# Patient Record
Sex: Female | Born: 1960 | Race: White | Hispanic: No | Marital: Single | Smoking: Never smoker
Health system: Southern US, Community
[De-identification: ages and names within clinical notes are randomized; demographics above are authoritative.]

## PROBLEM LIST (undated history)

## (undated) DIAGNOSIS — M7989 Other specified soft tissue disorders: Secondary | ICD-10-CM

## (undated) DIAGNOSIS — J189 Pneumonia, unspecified organism: Secondary | ICD-10-CM

## (undated) DIAGNOSIS — K589 Irritable bowel syndrome without diarrhea: Secondary | ICD-10-CM

## (undated) DIAGNOSIS — D696 Thrombocytopenia, unspecified: Secondary | ICD-10-CM

## (undated) DIAGNOSIS — G629 Polyneuropathy, unspecified: Secondary | ICD-10-CM

## (undated) DIAGNOSIS — F3289 Other specified depressive episodes: Secondary | ICD-10-CM

## (undated) DIAGNOSIS — R110 Nausea: Secondary | ICD-10-CM

## (undated) DIAGNOSIS — E785 Hyperlipidemia, unspecified: Secondary | ICD-10-CM

## (undated) DIAGNOSIS — I82409 Acute embolism and thrombosis of unspecified deep veins of unspecified lower extremity: Secondary | ICD-10-CM

## (undated) DIAGNOSIS — I509 Heart failure, unspecified: Secondary | ICD-10-CM

## (undated) DIAGNOSIS — N912 Amenorrhea, unspecified: Secondary | ICD-10-CM

## (undated) DIAGNOSIS — I499 Cardiac arrhythmia, unspecified: Secondary | ICD-10-CM

## (undated) DIAGNOSIS — K219 Gastro-esophageal reflux disease without esophagitis: Secondary | ICD-10-CM

## (undated) DIAGNOSIS — F419 Anxiety disorder, unspecified: Secondary | ICD-10-CM

## (undated) DIAGNOSIS — C541 Malignant neoplasm of endometrium: Secondary | ICD-10-CM

## (undated) DIAGNOSIS — F329 Major depressive disorder, single episode, unspecified: Secondary | ICD-10-CM

## (undated) DIAGNOSIS — A0472 Enterocolitis due to Clostridium difficile, not specified as recurrent: Secondary | ICD-10-CM

## (undated) DIAGNOSIS — R002 Palpitations: Secondary | ICD-10-CM

## (undated) DIAGNOSIS — R0602 Shortness of breath: Secondary | ICD-10-CM

## (undated) DIAGNOSIS — G4733 Obstructive sleep apnea (adult) (pediatric): Secondary | ICD-10-CM

## (undated) DIAGNOSIS — N189 Chronic kidney disease, unspecified: Secondary | ICD-10-CM

## (undated) DIAGNOSIS — R161 Splenomegaly, not elsewhere classified: Principal | ICD-10-CM

## (undated) DIAGNOSIS — Z9889 Other specified postprocedural states: Secondary | ICD-10-CM

## (undated) DIAGNOSIS — R112 Nausea with vomiting, unspecified: Secondary | ICD-10-CM

## (undated) DIAGNOSIS — R531 Weakness: Secondary | ICD-10-CM

## (undated) DIAGNOSIS — I209 Angina pectoris, unspecified: Secondary | ICD-10-CM

## (undated) DIAGNOSIS — R162 Hepatomegaly with splenomegaly, not elsewhere classified: Secondary | ICD-10-CM

## (undated) DIAGNOSIS — M1990 Unspecified osteoarthritis, unspecified site: Secondary | ICD-10-CM

## (undated) DIAGNOSIS — R32 Unspecified urinary incontinence: Secondary | ICD-10-CM

## (undated) HISTORY — DX: Hyperlipidemia, unspecified: E78.5

## (undated) HISTORY — DX: Palpitations: R00.2

## (undated) HISTORY — DX: Polyneuropathy, unspecified: G62.9

## (undated) HISTORY — PX: CHOLECYSTECTOMY: SHX55

## (undated) HISTORY — DX: Enterocolitis due to Clostridium difficile, not specified as recurrent: A04.72

## (undated) HISTORY — PX: SKIN GRAFT: SHX250

## (undated) HISTORY — DX: Thrombocytopenia, unspecified: D69.6

## (undated) HISTORY — DX: Hepatomegaly with splenomegaly, not elsewhere classified: R16.2

## (undated) HISTORY — DX: Major depressive disorder, single episode, unspecified: F32.9

## (undated) HISTORY — DX: Splenomegaly, not elsewhere classified: R16.1

## (undated) HISTORY — DX: Obstructive sleep apnea (adult) (pediatric): G47.33

## (undated) HISTORY — PX: FINGER SURGERY: SHX640

## (undated) HISTORY — DX: Nausea: R11.0

## (undated) HISTORY — DX: Weakness: R53.1

## (undated) HISTORY — DX: Amenorrhea, unspecified: N91.2

## (undated) HISTORY — PX: BREAST BIOPSY: SHX20

## (undated) HISTORY — PX: DILATION AND CURETTAGE OF UTERUS: SHX78

## (undated) HISTORY — DX: Gastro-esophageal reflux disease without esophagitis: K21.9

## (undated) HISTORY — DX: Unspecified urinary incontinence: R32

## (undated) HISTORY — DX: Other specified depressive episodes: F32.89

## (undated) HISTORY — DX: Morbid (severe) obesity due to excess calories: E66.01

## (undated) HISTORY — DX: Heart failure, unspecified: I50.9

## (undated) HISTORY — PX: ENDOMETRIAL ABLATION: SHX621

## (undated) HISTORY — DX: Unspecified osteoarthritis, unspecified site: M19.90

## (undated) HISTORY — DX: Other specified soft tissue disorders: M79.89

## (undated) HISTORY — DX: Acute embolism and thrombosis of unspecified deep veins of unspecified lower extremity: I82.409

---

## 1998-02-06 ENCOUNTER — Inpatient Hospital Stay (HOSPITAL_COMMUNITY): Admission: EM | Admit: 1998-02-06 | Discharge: 1998-02-17 | Payer: Self-pay | Admitting: Emergency Medicine

## 1998-09-01 ENCOUNTER — Encounter: Admission: RE | Admit: 1998-09-01 | Discharge: 1998-11-30 | Payer: Self-pay | Admitting: Family Medicine

## 1998-10-02 ENCOUNTER — Inpatient Hospital Stay (HOSPITAL_COMMUNITY): Admission: EM | Admit: 1998-10-02 | Discharge: 1998-10-07 | Payer: Self-pay | Admitting: Emergency Medicine

## 1998-10-02 ENCOUNTER — Encounter: Payer: Self-pay | Admitting: Emergency Medicine

## 1999-04-02 ENCOUNTER — Emergency Department (HOSPITAL_COMMUNITY): Admission: EM | Admit: 1999-04-02 | Discharge: 1999-04-02 | Payer: Self-pay | Admitting: Emergency Medicine

## 1999-04-02 ENCOUNTER — Encounter: Payer: Self-pay | Admitting: Emergency Medicine

## 1999-04-06 ENCOUNTER — Emergency Department (HOSPITAL_COMMUNITY): Admission: EM | Admit: 1999-04-06 | Discharge: 1999-04-06 | Payer: Self-pay | Admitting: Emergency Medicine

## 1999-04-06 ENCOUNTER — Encounter: Payer: Self-pay | Admitting: Emergency Medicine

## 1999-04-20 ENCOUNTER — Other Ambulatory Visit: Admission: RE | Admit: 1999-04-20 | Discharge: 1999-04-20 | Payer: Self-pay | Admitting: *Deleted

## 1999-05-17 ENCOUNTER — Encounter: Payer: Self-pay | Admitting: *Deleted

## 1999-05-17 ENCOUNTER — Inpatient Hospital Stay (HOSPITAL_COMMUNITY): Admission: EM | Admit: 1999-05-17 | Discharge: 1999-05-26 | Payer: Self-pay | Admitting: Podiatry

## 1999-06-03 ENCOUNTER — Encounter: Payer: Self-pay | Admitting: Emergency Medicine

## 1999-06-04 ENCOUNTER — Inpatient Hospital Stay (HOSPITAL_COMMUNITY): Admission: EM | Admit: 1999-06-04 | Discharge: 1999-06-07 | Payer: Self-pay | Admitting: Emergency Medicine

## 1999-06-05 ENCOUNTER — Encounter: Payer: Self-pay | Admitting: Family Medicine

## 1999-06-26 HISTORY — PX: TRACHEOSTOMY: SUR1362

## 1999-07-07 ENCOUNTER — Inpatient Hospital Stay (HOSPITAL_COMMUNITY): Admission: RE | Admit: 1999-07-07 | Discharge: 1999-07-18 | Payer: Self-pay | Admitting: Otolaryngology

## 1999-07-12 ENCOUNTER — Encounter: Payer: Self-pay | Admitting: Critical Care Medicine

## 1999-07-13 ENCOUNTER — Encounter: Payer: Self-pay | Admitting: Otolaryngology

## 1999-07-21 ENCOUNTER — Inpatient Hospital Stay (HOSPITAL_COMMUNITY): Admission: EM | Admit: 1999-07-21 | Discharge: 1999-08-02 | Payer: Self-pay | Admitting: Emergency Medicine

## 1999-08-21 ENCOUNTER — Emergency Department (HOSPITAL_COMMUNITY): Admission: EM | Admit: 1999-08-21 | Discharge: 1999-08-21 | Payer: Self-pay | Admitting: Emergency Medicine

## 1999-09-02 ENCOUNTER — Emergency Department (HOSPITAL_COMMUNITY): Admission: EM | Admit: 1999-09-02 | Discharge: 1999-09-02 | Payer: Self-pay | Admitting: Emergency Medicine

## 1999-10-09 ENCOUNTER — Encounter: Payer: Self-pay | Admitting: Emergency Medicine

## 1999-10-09 ENCOUNTER — Inpatient Hospital Stay (HOSPITAL_COMMUNITY): Admission: EM | Admit: 1999-10-09 | Discharge: 1999-10-12 | Payer: Self-pay | Admitting: Emergency Medicine

## 1999-10-18 ENCOUNTER — Encounter: Payer: Self-pay | Admitting: Emergency Medicine

## 1999-10-18 ENCOUNTER — Emergency Department (HOSPITAL_COMMUNITY): Admission: EM | Admit: 1999-10-18 | Discharge: 1999-10-18 | Payer: Self-pay | Admitting: Emergency Medicine

## 1999-12-16 ENCOUNTER — Emergency Department (HOSPITAL_COMMUNITY): Admission: EM | Admit: 1999-12-16 | Discharge: 1999-12-16 | Payer: Self-pay | Admitting: Emergency Medicine

## 1999-12-16 ENCOUNTER — Encounter: Payer: Self-pay | Admitting: Emergency Medicine

## 2000-02-19 ENCOUNTER — Emergency Department (HOSPITAL_COMMUNITY): Admission: EM | Admit: 2000-02-19 | Discharge: 2000-02-19 | Payer: Self-pay | Admitting: Emergency Medicine

## 2000-04-13 ENCOUNTER — Emergency Department (HOSPITAL_COMMUNITY): Admission: EM | Admit: 2000-04-13 | Discharge: 2000-04-13 | Payer: Self-pay | Admitting: *Deleted

## 2000-04-13 ENCOUNTER — Encounter: Payer: Self-pay | Admitting: *Deleted

## 2000-06-21 ENCOUNTER — Ambulatory Visit (HOSPITAL_COMMUNITY): Admission: RE | Admit: 2000-06-21 | Discharge: 2000-06-21 | Payer: Self-pay | Admitting: Family Medicine

## 2000-07-07 ENCOUNTER — Emergency Department (HOSPITAL_COMMUNITY): Admission: EM | Admit: 2000-07-07 | Discharge: 2000-07-07 | Payer: Self-pay | Admitting: Emergency Medicine

## 2000-07-07 ENCOUNTER — Encounter: Payer: Self-pay | Admitting: Emergency Medicine

## 2000-08-06 ENCOUNTER — Encounter: Admission: RE | Admit: 2000-08-06 | Discharge: 2000-08-06 | Payer: Self-pay | Admitting: Family Medicine

## 2000-08-06 ENCOUNTER — Encounter: Payer: Self-pay | Admitting: Family Medicine

## 2000-10-13 ENCOUNTER — Emergency Department (HOSPITAL_COMMUNITY): Admission: EM | Admit: 2000-10-13 | Discharge: 2000-10-13 | Payer: Self-pay | Admitting: Emergency Medicine

## 2000-10-13 ENCOUNTER — Encounter: Payer: Self-pay | Admitting: Emergency Medicine

## 2000-10-15 ENCOUNTER — Emergency Department (HOSPITAL_COMMUNITY): Admission: EM | Admit: 2000-10-15 | Discharge: 2000-10-15 | Payer: Self-pay | Admitting: Emergency Medicine

## 2000-10-17 ENCOUNTER — Emergency Department (HOSPITAL_COMMUNITY): Admission: EM | Admit: 2000-10-17 | Discharge: 2000-10-17 | Payer: Self-pay | Admitting: Emergency Medicine

## 2000-11-27 ENCOUNTER — Emergency Department (HOSPITAL_COMMUNITY): Admission: EM | Admit: 2000-11-27 | Discharge: 2000-11-27 | Payer: Self-pay | Admitting: Emergency Medicine

## 2000-11-27 ENCOUNTER — Encounter: Payer: Self-pay | Admitting: Emergency Medicine

## 2000-11-28 ENCOUNTER — Inpatient Hospital Stay (HOSPITAL_COMMUNITY): Admission: EM | Admit: 2000-11-28 | Discharge: 2000-12-02 | Payer: Self-pay | Admitting: Emergency Medicine

## 2000-11-28 ENCOUNTER — Encounter: Payer: Self-pay | Admitting: Emergency Medicine

## 2001-02-14 ENCOUNTER — Other Ambulatory Visit: Admission: RE | Admit: 2001-02-14 | Discharge: 2001-02-14 | Payer: Self-pay | Admitting: Obstetrics and Gynecology

## 2001-03-05 ENCOUNTER — Encounter (INDEPENDENT_AMBULATORY_CARE_PROVIDER_SITE_OTHER): Payer: Self-pay | Admitting: Specialist

## 2001-03-05 ENCOUNTER — Observation Stay (HOSPITAL_COMMUNITY): Admission: RE | Admit: 2001-03-05 | Discharge: 2001-03-06 | Payer: Self-pay | Admitting: Obstetrics and Gynecology

## 2001-06-20 ENCOUNTER — Encounter: Payer: Self-pay | Admitting: Emergency Medicine

## 2001-06-20 ENCOUNTER — Inpatient Hospital Stay (HOSPITAL_COMMUNITY): Admission: EM | Admit: 2001-06-20 | Discharge: 2001-06-23 | Payer: Self-pay | Admitting: Emergency Medicine

## 2001-06-25 HISTORY — PX: ESOPHAGEAL DILATION: SHX303

## 2001-09-12 ENCOUNTER — Emergency Department (HOSPITAL_COMMUNITY): Admission: EM | Admit: 2001-09-12 | Discharge: 2001-09-12 | Payer: Self-pay

## 2001-09-18 ENCOUNTER — Encounter: Payer: Self-pay | Admitting: Emergency Medicine

## 2001-09-19 ENCOUNTER — Inpatient Hospital Stay (HOSPITAL_COMMUNITY): Admission: EM | Admit: 2001-09-19 | Discharge: 2001-09-22 | Payer: Self-pay | Admitting: Emergency Medicine

## 2002-02-18 ENCOUNTER — Ambulatory Visit (HOSPITAL_COMMUNITY): Admission: RE | Admit: 2002-02-18 | Discharge: 2002-02-18 | Payer: Self-pay | Admitting: Internal Medicine

## 2002-02-18 ENCOUNTER — Encounter: Payer: Self-pay | Admitting: Internal Medicine

## 2002-02-25 ENCOUNTER — Encounter: Payer: Self-pay | Admitting: Internal Medicine

## 2002-02-25 ENCOUNTER — Ambulatory Visit (HOSPITAL_COMMUNITY): Admission: RE | Admit: 2002-02-25 | Discharge: 2002-02-25 | Payer: Self-pay | Admitting: Internal Medicine

## 2002-03-06 ENCOUNTER — Emergency Department (HOSPITAL_COMMUNITY): Admission: EM | Admit: 2002-03-06 | Discharge: 2002-03-06 | Payer: Self-pay

## 2002-04-06 ENCOUNTER — Other Ambulatory Visit: Admission: RE | Admit: 2002-04-06 | Discharge: 2002-04-06 | Payer: Self-pay | Admitting: Obstetrics and Gynecology

## 2002-07-06 ENCOUNTER — Emergency Department (HOSPITAL_COMMUNITY): Admission: EM | Admit: 2002-07-06 | Discharge: 2002-07-06 | Payer: Self-pay | Admitting: Emergency Medicine

## 2002-12-19 ENCOUNTER — Emergency Department (HOSPITAL_COMMUNITY): Admission: EM | Admit: 2002-12-19 | Discharge: 2002-12-20 | Payer: Self-pay | Admitting: *Deleted

## 2003-01-26 ENCOUNTER — Encounter: Admission: RE | Admit: 2003-01-26 | Discharge: 2003-04-26 | Payer: Self-pay | Admitting: Family Medicine

## 2003-04-13 ENCOUNTER — Other Ambulatory Visit: Admission: RE | Admit: 2003-04-13 | Discharge: 2003-04-13 | Payer: Self-pay | Admitting: Obstetrics and Gynecology

## 2003-05-24 ENCOUNTER — Emergency Department (HOSPITAL_COMMUNITY): Admission: EM | Admit: 2003-05-24 | Discharge: 2003-05-24 | Payer: Self-pay | Admitting: Emergency Medicine

## 2003-06-24 ENCOUNTER — Emergency Department (HOSPITAL_COMMUNITY): Admission: EM | Admit: 2003-06-24 | Discharge: 2003-06-24 | Payer: Self-pay | Admitting: Emergency Medicine

## 2003-07-20 ENCOUNTER — Encounter: Admission: RE | Admit: 2003-07-20 | Discharge: 2003-10-18 | Payer: Self-pay | Admitting: Family Medicine

## 2003-09-30 ENCOUNTER — Ambulatory Visit (HOSPITAL_COMMUNITY): Admission: RE | Admit: 2003-09-30 | Discharge: 2003-09-30 | Payer: Self-pay | Admitting: Family Medicine

## 2003-10-19 ENCOUNTER — Encounter: Admission: RE | Admit: 2003-10-19 | Discharge: 2004-01-17 | Payer: Self-pay | Admitting: Family Medicine

## 2003-11-05 ENCOUNTER — Encounter (INDEPENDENT_AMBULATORY_CARE_PROVIDER_SITE_OTHER): Payer: Self-pay | Admitting: *Deleted

## 2003-11-05 ENCOUNTER — Ambulatory Visit (HOSPITAL_COMMUNITY): Admission: RE | Admit: 2003-11-05 | Discharge: 2003-11-06 | Payer: Self-pay | Admitting: General Surgery

## 2004-03-21 ENCOUNTER — Encounter: Admission: RE | Admit: 2004-03-21 | Discharge: 2004-03-21 | Payer: Self-pay | Admitting: Family Medicine

## 2004-05-04 ENCOUNTER — Other Ambulatory Visit: Admission: RE | Admit: 2004-05-04 | Discharge: 2004-05-04 | Payer: Self-pay | Admitting: Obstetrics and Gynecology

## 2004-06-14 ENCOUNTER — Encounter: Admission: RE | Admit: 2004-06-14 | Discharge: 2004-06-14 | Payer: Self-pay | Admitting: Family Medicine

## 2004-06-14 ENCOUNTER — Ambulatory Visit: Payer: Self-pay | Admitting: Pulmonary Disease

## 2004-06-24 ENCOUNTER — Emergency Department (HOSPITAL_COMMUNITY): Admission: EM | Admit: 2004-06-24 | Discharge: 2004-06-24 | Payer: Self-pay | Admitting: Emergency Medicine

## 2004-06-27 ENCOUNTER — Ambulatory Visit: Payer: Self-pay | Admitting: Pulmonary Disease

## 2004-07-04 ENCOUNTER — Ambulatory Visit: Payer: Self-pay | Admitting: Pulmonary Disease

## 2004-09-20 ENCOUNTER — Encounter: Admission: RE | Admit: 2004-09-20 | Discharge: 2004-12-19 | Payer: Self-pay | Admitting: Family Medicine

## 2004-10-05 ENCOUNTER — Ambulatory Visit: Payer: Self-pay | Admitting: Infectious Diseases

## 2004-11-02 ENCOUNTER — Ambulatory Visit: Payer: Self-pay | Admitting: Infectious Diseases

## 2004-11-16 ENCOUNTER — Ambulatory Visit: Payer: Self-pay | Admitting: Internal Medicine

## 2004-12-01 ENCOUNTER — Ambulatory Visit: Payer: Self-pay | Admitting: Infectious Diseases

## 2004-12-21 ENCOUNTER — Ambulatory Visit: Payer: Self-pay | Admitting: Pulmonary Disease

## 2004-12-25 ENCOUNTER — Ambulatory Visit: Payer: Self-pay | Admitting: Internal Medicine

## 2005-02-27 ENCOUNTER — Encounter: Admission: RE | Admit: 2005-02-27 | Discharge: 2005-03-19 | Payer: Self-pay | Admitting: Family Medicine

## 2005-03-16 ENCOUNTER — Ambulatory Visit: Payer: Self-pay | Admitting: Infectious Diseases

## 2005-04-01 ENCOUNTER — Inpatient Hospital Stay (HOSPITAL_COMMUNITY): Admission: RE | Admit: 2005-04-01 | Discharge: 2005-04-08 | Payer: Self-pay | Admitting: Obstetrics and Gynecology

## 2005-04-03 ENCOUNTER — Encounter (INDEPENDENT_AMBULATORY_CARE_PROVIDER_SITE_OTHER): Payer: Self-pay | Admitting: *Deleted

## 2005-04-09 ENCOUNTER — Inpatient Hospital Stay (HOSPITAL_COMMUNITY): Admission: AD | Admit: 2005-04-09 | Discharge: 2005-04-09 | Payer: Self-pay | Admitting: Obstetrics and Gynecology

## 2005-05-11 ENCOUNTER — Ambulatory Visit: Payer: Self-pay | Admitting: Pulmonary Disease

## 2005-05-22 ENCOUNTER — Ambulatory Visit: Payer: Self-pay | Admitting: Infectious Diseases

## 2005-05-23 ENCOUNTER — Other Ambulatory Visit: Admission: RE | Admit: 2005-05-23 | Discharge: 2005-05-23 | Payer: Self-pay | Admitting: Obstetrics and Gynecology

## 2005-05-29 ENCOUNTER — Encounter: Admission: RE | Admit: 2005-05-29 | Discharge: 2005-06-24 | Payer: Self-pay | Admitting: Family Medicine

## 2005-08-21 ENCOUNTER — Encounter: Admission: RE | Admit: 2005-08-21 | Discharge: 2005-08-21 | Payer: Self-pay | Admitting: Neurology

## 2005-08-28 ENCOUNTER — Encounter: Admission: RE | Admit: 2005-08-28 | Discharge: 2005-11-26 | Payer: Self-pay | Admitting: Family Medicine

## 2005-08-30 ENCOUNTER — Encounter: Admission: RE | Admit: 2005-08-30 | Discharge: 2005-11-28 | Payer: Self-pay | Admitting: Neurology

## 2005-11-08 ENCOUNTER — Ambulatory Visit: Payer: Self-pay | Admitting: Pulmonary Disease

## 2005-12-05 ENCOUNTER — Encounter: Admission: RE | Admit: 2005-12-05 | Discharge: 2005-12-05 | Payer: Self-pay | Admitting: Family Medicine

## 2006-01-01 ENCOUNTER — Encounter: Admission: RE | Admit: 2006-01-01 | Discharge: 2006-03-14 | Payer: Self-pay | Admitting: Family Medicine

## 2006-01-29 ENCOUNTER — Encounter (INDEPENDENT_AMBULATORY_CARE_PROVIDER_SITE_OTHER): Payer: Self-pay | Admitting: Cardiology

## 2006-01-29 ENCOUNTER — Ambulatory Visit (HOSPITAL_COMMUNITY): Admission: RE | Admit: 2006-01-29 | Discharge: 2006-01-29 | Payer: Self-pay | Admitting: Family Medicine

## 2006-02-06 ENCOUNTER — Ambulatory Visit: Payer: Self-pay | Admitting: Pulmonary Disease

## 2006-03-12 ENCOUNTER — Emergency Department (HOSPITAL_COMMUNITY): Admission: EM | Admit: 2006-03-12 | Discharge: 2006-03-12 | Payer: Self-pay | Admitting: Emergency Medicine

## 2006-03-25 ENCOUNTER — Ambulatory Visit: Admission: RE | Admit: 2006-03-25 | Discharge: 2006-03-25 | Payer: Self-pay | Admitting: Family Medicine

## 2006-04-03 ENCOUNTER — Ambulatory Visit: Payer: Self-pay | Admitting: Pulmonary Disease

## 2006-04-17 ENCOUNTER — Encounter: Admission: RE | Admit: 2006-04-17 | Discharge: 2006-07-16 | Payer: Self-pay | Admitting: Family Medicine

## 2006-04-30 ENCOUNTER — Ambulatory Visit: Payer: Self-pay | Admitting: Oncology

## 2006-05-07 LAB — MORPHOLOGY

## 2006-05-07 LAB — CHCC SMEAR

## 2006-05-07 LAB — CBC WITH DIFFERENTIAL/PLATELET
Basophils Absolute: 0 10*3/uL (ref 0.0–0.1)
EOS%: 0.9 % (ref 0.0–7.0)
HGB: 12.5 g/dL (ref 11.6–15.9)
LYMPH%: 16.9 % (ref 14.0–48.0)
MCH: 29.2 pg (ref 26.0–34.0)
MCV: 88.5 fL (ref 81.0–101.0)
MONO%: 10.1 % (ref 0.0–13.0)
Platelets: 175 10*3/uL (ref 145–400)
RDW: 15.6 % — ABNORMAL HIGH (ref 11.3–14.5)

## 2006-05-08 LAB — URIC ACID: Uric Acid, Serum: 8.5 mg/dL — ABNORMAL HIGH (ref 2.4–7.0)

## 2006-05-08 LAB — COMPREHENSIVE METABOLIC PANEL
ALT: 17 U/L (ref 0–35)
Albumin: 3.9 g/dL (ref 3.5–5.2)
CO2: 28 mEq/L (ref 19–32)
Calcium: 9.3 mg/dL (ref 8.4–10.5)
Chloride: 111 mEq/L (ref 96–112)
Glucose, Bld: 153 mg/dL — ABNORMAL HIGH (ref 70–99)
Potassium: 4.8 mEq/L (ref 3.5–5.3)
Sodium: 137 mEq/L (ref 135–145)
Total Protein: 7.4 g/dL (ref 6.0–8.3)

## 2006-05-08 LAB — BETA 2 MICROGLOBULIN, SERUM: Beta-2 Microglobulin: 2.77 mg/L — ABNORMAL HIGH (ref 1.01–1.73)

## 2006-05-08 LAB — LACTATE DEHYDROGENASE: LDH: 161 U/L (ref 94–250)

## 2006-05-08 LAB — PROTEIN ELECTROPHORESIS, SERUM
Beta 2: 7.3 % — ABNORMAL HIGH (ref 3.2–6.5)
Gamma Globulin: 20.1 % — ABNORMAL HIGH (ref 11.1–18.8)

## 2006-05-15 ENCOUNTER — Emergency Department (HOSPITAL_COMMUNITY): Admission: EM | Admit: 2006-05-15 | Discharge: 2006-05-15 | Payer: Self-pay | Admitting: Emergency Medicine

## 2006-05-29 ENCOUNTER — Ambulatory Visit (HOSPITAL_COMMUNITY): Admission: RE | Admit: 2006-05-29 | Discharge: 2006-05-29 | Payer: Self-pay | Admitting: Oncology

## 2006-06-13 ENCOUNTER — Ambulatory Visit: Payer: Self-pay | Admitting: Oncology

## 2006-06-20 LAB — CBC WITH DIFFERENTIAL/PLATELET
BASO%: 0.3 % (ref 0.0–2.0)
EOS%: 1.2 % (ref 0.0–7.0)
MCH: 29.7 pg (ref 26.0–34.0)
MCHC: 32.7 g/dL (ref 32.0–36.0)
MONO%: 9.7 % (ref 0.0–13.0)
NEUT%: 71.3 % (ref 39.6–76.8)
RDW: 16.6 % — ABNORMAL HIGH (ref 11.3–14.5)
lymph#: 1.4 10*3/uL (ref 0.9–3.3)

## 2006-06-20 LAB — MORPHOLOGY: PLT EST: DECREASED

## 2006-07-10 ENCOUNTER — Encounter: Admission: RE | Admit: 2006-07-10 | Discharge: 2006-07-10 | Payer: Self-pay | Admitting: Family Medicine

## 2006-07-23 ENCOUNTER — Encounter: Admission: RE | Admit: 2006-07-23 | Discharge: 2006-10-21 | Payer: Self-pay | Admitting: Family Medicine

## 2006-08-22 ENCOUNTER — Encounter: Admission: RE | Admit: 2006-08-22 | Discharge: 2006-11-20 | Payer: Self-pay | Admitting: Family Medicine

## 2006-09-05 ENCOUNTER — Ambulatory Visit: Payer: Self-pay | Admitting: Pulmonary Disease

## 2006-11-13 ENCOUNTER — Inpatient Hospital Stay (HOSPITAL_COMMUNITY): Admission: EM | Admit: 2006-11-13 | Discharge: 2006-11-16 | Payer: Self-pay | Admitting: Emergency Medicine

## 2006-11-17 ENCOUNTER — Ambulatory Visit: Payer: Self-pay | Admitting: Pulmonary Disease

## 2006-11-17 ENCOUNTER — Inpatient Hospital Stay (HOSPITAL_COMMUNITY): Admission: EM | Admit: 2006-11-17 | Discharge: 2006-11-22 | Payer: Self-pay | Admitting: Emergency Medicine

## 2006-12-11 ENCOUNTER — Ambulatory Visit: Payer: Self-pay | Admitting: Pulmonary Disease

## 2006-12-19 ENCOUNTER — Encounter: Admission: RE | Admit: 2006-12-19 | Discharge: 2007-03-19 | Payer: Self-pay | Admitting: Family Medicine

## 2007-02-06 ENCOUNTER — Inpatient Hospital Stay (HOSPITAL_COMMUNITY): Admission: EM | Admit: 2007-02-06 | Discharge: 2007-02-10 | Payer: Self-pay | Admitting: Emergency Medicine

## 2007-02-27 ENCOUNTER — Ambulatory Visit: Payer: Self-pay | Admitting: Pulmonary Disease

## 2007-03-28 ENCOUNTER — Encounter (INDEPENDENT_AMBULATORY_CARE_PROVIDER_SITE_OTHER): Payer: Self-pay | Admitting: *Deleted

## 2007-03-28 ENCOUNTER — Encounter: Admission: RE | Admit: 2007-03-28 | Discharge: 2007-03-28 | Payer: Self-pay | Admitting: Urology

## 2007-06-10 ENCOUNTER — Encounter (HOSPITAL_BASED_OUTPATIENT_CLINIC_OR_DEPARTMENT_OTHER): Admission: RE | Admit: 2007-06-10 | Discharge: 2007-06-23 | Payer: Self-pay | Admitting: Surgery

## 2007-06-24 ENCOUNTER — Encounter (HOSPITAL_BASED_OUTPATIENT_CLINIC_OR_DEPARTMENT_OTHER): Admission: RE | Admit: 2007-06-24 | Discharge: 2007-07-29 | Payer: Self-pay | Admitting: Surgery

## 2007-07-01 ENCOUNTER — Encounter: Admission: RE | Admit: 2007-07-01 | Discharge: 2007-07-01 | Payer: Self-pay | Admitting: Family Medicine

## 2007-08-07 DIAGNOSIS — G4733 Obstructive sleep apnea (adult) (pediatric): Secondary | ICD-10-CM

## 2007-08-07 DIAGNOSIS — E678 Other specified hyperalimentation: Secondary | ICD-10-CM | POA: Insufficient documentation

## 2007-08-08 ENCOUNTER — Ambulatory Visit: Payer: Self-pay | Admitting: Pulmonary Disease

## 2007-09-02 ENCOUNTER — Encounter: Admission: RE | Admit: 2007-09-02 | Discharge: 2007-09-02 | Payer: Self-pay | Admitting: Family Medicine

## 2007-09-04 ENCOUNTER — Encounter: Payer: Self-pay | Admitting: Pulmonary Disease

## 2007-09-16 ENCOUNTER — Encounter: Payer: Self-pay | Admitting: Pulmonary Disease

## 2007-10-14 ENCOUNTER — Encounter: Payer: Self-pay | Admitting: Pulmonary Disease

## 2007-11-04 ENCOUNTER — Encounter: Admission: RE | Admit: 2007-11-04 | Discharge: 2007-11-04 | Payer: Self-pay | Admitting: Family Medicine

## 2007-11-13 ENCOUNTER — Ambulatory Visit: Admission: RE | Admit: 2007-11-13 | Discharge: 2007-11-13 | Payer: Self-pay | Admitting: Family Medicine

## 2007-11-13 ENCOUNTER — Encounter (INDEPENDENT_AMBULATORY_CARE_PROVIDER_SITE_OTHER): Payer: Self-pay | Admitting: Family Medicine

## 2007-11-18 ENCOUNTER — Encounter: Admission: RE | Admit: 2007-11-18 | Discharge: 2007-12-02 | Payer: Self-pay | Admitting: Family Medicine

## 2008-01-09 ENCOUNTER — Telehealth (INDEPENDENT_AMBULATORY_CARE_PROVIDER_SITE_OTHER): Payer: Self-pay | Admitting: *Deleted

## 2008-01-12 ENCOUNTER — Encounter: Payer: Self-pay | Admitting: Pulmonary Disease

## 2008-01-23 ENCOUNTER — Ambulatory Visit: Payer: Self-pay | Admitting: Pulmonary Disease

## 2008-02-17 ENCOUNTER — Encounter: Payer: Self-pay | Admitting: Pulmonary Disease

## 2008-02-17 ENCOUNTER — Ambulatory Visit (HOSPITAL_BASED_OUTPATIENT_CLINIC_OR_DEPARTMENT_OTHER): Admission: RE | Admit: 2008-02-17 | Discharge: 2008-02-17 | Payer: Self-pay | Admitting: Pulmonary Disease

## 2008-02-19 ENCOUNTER — Ambulatory Visit: Payer: Self-pay | Admitting: Pulmonary Disease

## 2008-02-25 ENCOUNTER — Ambulatory Visit: Payer: Self-pay | Admitting: Pulmonary Disease

## 2008-03-02 ENCOUNTER — Encounter: Admission: RE | Admit: 2008-03-02 | Discharge: 2008-03-02 | Payer: Self-pay | Admitting: Family Medicine

## 2008-03-22 ENCOUNTER — Telehealth: Payer: Self-pay | Admitting: Internal Medicine

## 2008-03-26 DIAGNOSIS — R109 Unspecified abdominal pain: Secondary | ICD-10-CM

## 2008-03-26 DIAGNOSIS — K7689 Other specified diseases of liver: Secondary | ICD-10-CM | POA: Insufficient documentation

## 2008-03-26 DIAGNOSIS — K219 Gastro-esophageal reflux disease without esophagitis: Secondary | ICD-10-CM

## 2008-03-26 DIAGNOSIS — A4902 Methicillin resistant Staphylococcus aureus infection, unspecified site: Secondary | ICD-10-CM | POA: Insufficient documentation

## 2008-03-26 DIAGNOSIS — R161 Splenomegaly, not elsewhere classified: Secondary | ICD-10-CM

## 2008-03-29 ENCOUNTER — Ambulatory Visit: Payer: Self-pay | Admitting: Internal Medicine

## 2008-04-05 ENCOUNTER — Telehealth: Payer: Self-pay | Admitting: Internal Medicine

## 2008-04-13 ENCOUNTER — Telehealth: Payer: Self-pay | Admitting: Internal Medicine

## 2008-04-15 ENCOUNTER — Encounter: Payer: Self-pay | Admitting: Internal Medicine

## 2008-04-20 ENCOUNTER — Ambulatory Visit: Payer: Self-pay | Admitting: Internal Medicine

## 2008-04-20 ENCOUNTER — Ambulatory Visit (HOSPITAL_COMMUNITY): Admission: RE | Admit: 2008-04-20 | Discharge: 2008-04-20 | Payer: Self-pay | Admitting: Internal Medicine

## 2008-04-22 ENCOUNTER — Telehealth: Payer: Self-pay | Admitting: Internal Medicine

## 2008-04-28 ENCOUNTER — Encounter: Payer: Self-pay | Admitting: Pulmonary Disease

## 2008-04-28 ENCOUNTER — Ambulatory Visit (HOSPITAL_BASED_OUTPATIENT_CLINIC_OR_DEPARTMENT_OTHER): Admission: RE | Admit: 2008-04-28 | Discharge: 2008-04-28 | Payer: Self-pay | Admitting: Pulmonary Disease

## 2008-04-29 ENCOUNTER — Ambulatory Visit: Payer: Self-pay | Admitting: Pulmonary Disease

## 2008-05-05 ENCOUNTER — Ambulatory Visit: Payer: Self-pay | Admitting: Pulmonary Disease

## 2008-05-05 ENCOUNTER — Telehealth: Payer: Self-pay | Admitting: Internal Medicine

## 2008-05-27 ENCOUNTER — Encounter: Payer: Self-pay | Admitting: Pulmonary Disease

## 2008-07-24 ENCOUNTER — Encounter: Payer: Self-pay | Admitting: Pulmonary Disease

## 2008-07-28 ENCOUNTER — Encounter: Payer: Self-pay | Admitting: Pulmonary Disease

## 2008-08-04 ENCOUNTER — Encounter: Admission: RE | Admit: 2008-08-04 | Discharge: 2008-11-02 | Payer: Self-pay | Admitting: Family Medicine

## 2008-08-16 ENCOUNTER — Encounter: Payer: Self-pay | Admitting: Pulmonary Disease

## 2008-08-17 ENCOUNTER — Ambulatory Visit: Payer: Self-pay | Admitting: Pulmonary Disease

## 2008-08-22 ENCOUNTER — Encounter: Payer: Self-pay | Admitting: Pulmonary Disease

## 2008-08-24 ENCOUNTER — Encounter: Payer: Self-pay | Admitting: Pulmonary Disease

## 2008-08-25 ENCOUNTER — Encounter: Payer: Self-pay | Admitting: Pulmonary Disease

## 2008-08-29 ENCOUNTER — Emergency Department (HOSPITAL_COMMUNITY): Admission: EM | Admit: 2008-08-29 | Discharge: 2008-08-29 | Payer: Self-pay | Admitting: Emergency Medicine

## 2008-09-08 ENCOUNTER — Ambulatory Visit: Payer: Self-pay | Admitting: Pulmonary Disease

## 2008-09-13 ENCOUNTER — Telehealth (INDEPENDENT_AMBULATORY_CARE_PROVIDER_SITE_OTHER): Payer: Self-pay | Admitting: *Deleted

## 2008-09-14 ENCOUNTER — Ambulatory Visit: Payer: Self-pay | Admitting: Internal Medicine

## 2008-09-14 DIAGNOSIS — I10 Essential (primary) hypertension: Secondary | ICD-10-CM

## 2008-09-16 ENCOUNTER — Telehealth: Payer: Self-pay | Admitting: Internal Medicine

## 2008-09-23 ENCOUNTER — Telehealth: Payer: Self-pay | Admitting: Pulmonary Disease

## 2008-09-28 ENCOUNTER — Telehealth (INDEPENDENT_AMBULATORY_CARE_PROVIDER_SITE_OTHER): Payer: Self-pay | Admitting: *Deleted

## 2008-09-28 ENCOUNTER — Encounter: Payer: Self-pay | Admitting: Pulmonary Disease

## 2008-09-29 ENCOUNTER — Encounter: Payer: Self-pay | Admitting: Pulmonary Disease

## 2008-09-29 ENCOUNTER — Ambulatory Visit: Payer: Self-pay | Admitting: Internal Medicine

## 2008-10-01 ENCOUNTER — Telehealth: Payer: Self-pay | Admitting: Pulmonary Disease

## 2008-10-06 ENCOUNTER — Encounter: Payer: Self-pay | Admitting: Internal Medicine

## 2008-10-15 ENCOUNTER — Encounter: Payer: Self-pay | Admitting: Internal Medicine

## 2008-10-21 ENCOUNTER — Telehealth: Payer: Self-pay | Admitting: Internal Medicine

## 2008-10-22 ENCOUNTER — Ambulatory Visit: Payer: Self-pay | Admitting: Internal Medicine

## 2008-10-22 DIAGNOSIS — R110 Nausea: Secondary | ICD-10-CM

## 2008-10-22 DIAGNOSIS — R197 Diarrhea, unspecified: Secondary | ICD-10-CM | POA: Insufficient documentation

## 2008-10-22 DIAGNOSIS — R1319 Other dysphagia: Secondary | ICD-10-CM

## 2008-10-25 ENCOUNTER — Telehealth: Payer: Self-pay | Admitting: Physician Assistant

## 2008-10-25 ENCOUNTER — Encounter: Payer: Self-pay | Admitting: Internal Medicine

## 2008-10-26 ENCOUNTER — Encounter: Payer: Self-pay | Admitting: Pulmonary Disease

## 2008-10-27 ENCOUNTER — Encounter: Payer: Self-pay | Admitting: Pulmonary Disease

## 2008-10-28 ENCOUNTER — Telehealth: Payer: Self-pay | Admitting: Physician Assistant

## 2008-10-30 ENCOUNTER — Encounter: Payer: Self-pay | Admitting: Physician Assistant

## 2008-11-04 ENCOUNTER — Telehealth: Payer: Self-pay | Admitting: Physician Assistant

## 2008-11-11 ENCOUNTER — Ambulatory Visit (HOSPITAL_COMMUNITY): Admission: RE | Admit: 2008-11-11 | Discharge: 2008-11-11 | Payer: Self-pay | Admitting: Internal Medicine

## 2008-11-23 ENCOUNTER — Encounter: Payer: Self-pay | Admitting: Pulmonary Disease

## 2008-11-25 ENCOUNTER — Ambulatory Visit: Payer: Self-pay | Admitting: Internal Medicine

## 2008-12-04 ENCOUNTER — Emergency Department (HOSPITAL_COMMUNITY): Admission: EM | Admit: 2008-12-04 | Discharge: 2008-12-04 | Payer: Self-pay | Admitting: Emergency Medicine

## 2008-12-05 ENCOUNTER — Emergency Department (HOSPITAL_COMMUNITY): Admission: EM | Admit: 2008-12-05 | Discharge: 2008-12-05 | Payer: Self-pay | Admitting: Emergency Medicine

## 2008-12-06 ENCOUNTER — Encounter: Admission: RE | Admit: 2008-12-06 | Discharge: 2009-03-06 | Payer: Self-pay | Admitting: Rheumatology

## 2008-12-22 ENCOUNTER — Ambulatory Visit: Payer: Self-pay | Admitting: Pulmonary Disease

## 2008-12-28 ENCOUNTER — Ambulatory Visit (HOSPITAL_COMMUNITY): Admission: RE | Admit: 2008-12-28 | Discharge: 2008-12-28 | Payer: Self-pay | Admitting: Cardiology

## 2008-12-28 ENCOUNTER — Ambulatory Visit: Payer: Self-pay | Admitting: Vascular Surgery

## 2008-12-28 ENCOUNTER — Encounter (INDEPENDENT_AMBULATORY_CARE_PROVIDER_SITE_OTHER): Payer: Self-pay | Admitting: Cardiology

## 2008-12-29 ENCOUNTER — Telehealth: Payer: Self-pay | Admitting: Pulmonary Disease

## 2008-12-31 ENCOUNTER — Telehealth: Payer: Self-pay | Admitting: Internal Medicine

## 2009-01-03 ENCOUNTER — Telehealth: Payer: Self-pay | Admitting: Internal Medicine

## 2009-01-12 ENCOUNTER — Encounter: Payer: Self-pay | Admitting: Pulmonary Disease

## 2009-01-17 ENCOUNTER — Encounter: Payer: Self-pay | Admitting: Pulmonary Disease

## 2009-01-25 ENCOUNTER — Encounter: Payer: Self-pay | Admitting: Pulmonary Disease

## 2009-02-14 ENCOUNTER — Inpatient Hospital Stay (HOSPITAL_COMMUNITY): Admission: EM | Admit: 2009-02-14 | Discharge: 2009-02-18 | Payer: Self-pay | Admitting: Gastroenterology

## 2009-02-15 ENCOUNTER — Encounter (INDEPENDENT_AMBULATORY_CARE_PROVIDER_SITE_OTHER): Payer: Self-pay | Admitting: Internal Medicine

## 2009-03-07 ENCOUNTER — Encounter: Admission: RE | Admit: 2009-03-07 | Discharge: 2009-06-05 | Payer: Self-pay | Admitting: Rheumatology

## 2009-03-23 ENCOUNTER — Encounter: Admission: RE | Admit: 2009-03-23 | Discharge: 2009-03-23 | Payer: Self-pay | Admitting: Family Medicine

## 2009-03-31 ENCOUNTER — Emergency Department (HOSPITAL_COMMUNITY): Admission: EM | Admit: 2009-03-31 | Discharge: 2009-03-31 | Payer: Self-pay | Admitting: Emergency Medicine

## 2009-03-31 ENCOUNTER — Encounter (INDEPENDENT_AMBULATORY_CARE_PROVIDER_SITE_OTHER): Payer: Self-pay | Admitting: *Deleted

## 2009-05-04 ENCOUNTER — Encounter: Payer: Self-pay | Admitting: Pulmonary Disease

## 2009-05-30 ENCOUNTER — Encounter: Payer: Self-pay | Admitting: Internal Medicine

## 2009-07-13 ENCOUNTER — Encounter: Payer: Self-pay | Admitting: Pulmonary Disease

## 2009-07-25 ENCOUNTER — Telehealth: Payer: Self-pay | Admitting: Internal Medicine

## 2009-07-29 ENCOUNTER — Telehealth: Payer: Self-pay | Admitting: Internal Medicine

## 2009-08-17 ENCOUNTER — Ambulatory Visit: Payer: Self-pay | Admitting: Internal Medicine

## 2009-08-18 ENCOUNTER — Encounter: Admission: RE | Admit: 2009-08-18 | Discharge: 2009-08-18 | Payer: Self-pay | Admitting: Family Medicine

## 2009-10-27 ENCOUNTER — Telehealth: Payer: Self-pay | Admitting: Pulmonary Disease

## 2009-10-31 ENCOUNTER — Ambulatory Visit: Payer: Self-pay | Admitting: Pulmonary Disease

## 2009-10-31 DIAGNOSIS — J45909 Unspecified asthma, uncomplicated: Secondary | ICD-10-CM | POA: Insufficient documentation

## 2009-10-31 DIAGNOSIS — J309 Allergic rhinitis, unspecified: Secondary | ICD-10-CM | POA: Insufficient documentation

## 2009-12-13 ENCOUNTER — Encounter: Admission: RE | Admit: 2009-12-13 | Discharge: 2009-12-13 | Payer: Self-pay | Admitting: Family Medicine

## 2009-12-16 ENCOUNTER — Encounter: Payer: Self-pay | Admitting: Internal Medicine

## 2009-12-30 ENCOUNTER — Telehealth: Payer: Self-pay | Admitting: Pulmonary Disease

## 2010-01-25 ENCOUNTER — Encounter: Payer: Self-pay | Admitting: Pulmonary Disease

## 2010-01-27 ENCOUNTER — Ambulatory Visit: Payer: Self-pay | Admitting: Oncology

## 2010-02-01 ENCOUNTER — Encounter: Payer: Self-pay | Admitting: Critical Care Medicine

## 2010-02-01 ENCOUNTER — Encounter: Payer: Self-pay | Admitting: Pulmonary Disease

## 2010-02-14 LAB — MANUAL DIFFERENTIAL (CHCC SATELLITE)
ANC (CHCC HP manual diff): 4.5 10*3/uL (ref 1.5–6.7)
Band Neutrophils: 1 % (ref 0–10)
PLT EST ~~LOC~~: ADEQUATE

## 2010-02-14 LAB — CMP (CANCER CENTER ONLY)
ALT(SGPT): 44 U/L (ref 10–47)
BUN, Bld: 29 mg/dL — ABNORMAL HIGH (ref 7–22)
CO2: 26 mEq/L (ref 18–33)
Calcium: 9 mg/dL (ref 8.0–10.3)
Chloride: 96 mEq/L — ABNORMAL LOW (ref 98–108)
Creat: 1 mg/dl (ref 0.6–1.2)

## 2010-02-14 LAB — CBC WITH DIFFERENTIAL (CANCER CENTER ONLY)
HCT: 38.4 % (ref 34.8–46.6)
HGB: 13.3 g/dL (ref 11.6–15.9)
MCV: 91 fL (ref 81–101)
Platelets: 189 10*3/uL (ref 145–400)
RBC: 4.2 10*6/uL (ref 3.70–5.32)
WBC: 7.7 10*3/uL (ref 3.9–10.0)

## 2010-02-16 ENCOUNTER — Ambulatory Visit (HOSPITAL_COMMUNITY): Admission: RE | Admit: 2010-02-16 | Discharge: 2010-02-16 | Payer: Self-pay | Admitting: Surgery

## 2010-02-23 ENCOUNTER — Encounter
Admission: RE | Admit: 2010-02-23 | Discharge: 2010-05-24 | Payer: Self-pay | Source: Home / Self Care | Admitting: Surgery

## 2010-03-07 ENCOUNTER — Ambulatory Visit (HOSPITAL_COMMUNITY): Admission: RE | Admit: 2010-03-07 | Discharge: 2010-03-07 | Payer: Self-pay | Admitting: Surgery

## 2010-03-09 ENCOUNTER — Encounter: Admission: RE | Admit: 2010-03-09 | Discharge: 2010-03-09 | Payer: Self-pay | Admitting: Oncology

## 2010-03-15 ENCOUNTER — Encounter: Payer: Self-pay | Admitting: Internal Medicine

## 2010-04-27 ENCOUNTER — Ambulatory Visit: Payer: Self-pay | Admitting: Pulmonary Disease

## 2010-05-19 ENCOUNTER — Ambulatory Visit: Payer: Self-pay | Admitting: Oncology

## 2010-06-02 ENCOUNTER — Ambulatory Visit (HOSPITAL_COMMUNITY)
Admission: RE | Admit: 2010-06-02 | Discharge: 2010-06-02 | Payer: Self-pay | Source: Home / Self Care | Attending: Surgery | Admitting: Surgery

## 2010-06-13 ENCOUNTER — Encounter
Admission: RE | Admit: 2010-06-13 | Discharge: 2010-07-25 | Payer: Self-pay | Source: Home / Self Care | Attending: Surgery | Admitting: Surgery

## 2010-06-14 LAB — CBC WITH DIFFERENTIAL/PLATELET
Eosinophils Absolute: 0.1 10*3/uL (ref 0.0–0.5)
LYMPH%: 22.8 % (ref 14.0–49.7)
MONO#: 0.8 10*3/uL (ref 0.1–0.9)
NEUT#: 5.2 10*3/uL (ref 1.5–6.5)
Platelets: 193 10*3/uL (ref 145–400)
RBC: 3.88 10*6/uL (ref 3.70–5.45)
RDW: 14.6 % — ABNORMAL HIGH (ref 11.2–14.5)
WBC: 7.8 10*3/uL (ref 3.9–10.3)
lymph#: 1.8 10*3/uL (ref 0.9–3.3)
nRBC: 0 % (ref 0–0)

## 2010-06-25 DIAGNOSIS — A0472 Enterocolitis due to Clostridium difficile, not specified as recurrent: Secondary | ICD-10-CM

## 2010-06-25 HISTORY — DX: Enterocolitis due to Clostridium difficile, not specified as recurrent: A04.72

## 2010-07-05 ENCOUNTER — Encounter: Payer: Self-pay | Admitting: Pulmonary Disease

## 2010-07-16 ENCOUNTER — Encounter: Payer: Self-pay | Admitting: Cardiology

## 2010-07-16 ENCOUNTER — Encounter: Payer: Self-pay | Admitting: Surgery

## 2010-07-16 ENCOUNTER — Encounter: Payer: Self-pay | Admitting: Urology

## 2010-07-25 NOTE — Miscellaneous (Signed)
Summary: Revised Plan of Treatment / [facility]   Revised Plan of Treatment / [facility]   Imported By: Lennie Odor 01/31/2010 14:30:57  _____________________________________________________________________  External Attachment:    Type:   Image     Comment:   External Document

## 2010-07-25 NOTE — Miscellaneous (Signed)
Summary: Bentyl Refills  Clinical Lists Changes  Medications: Changed medication from BENTYL 20 MG TABS (DICYCLOMINE HCL) Take 1 tablet by mouth two times a day to BENTYL 20 MG TABS (DICYCLOMINE HCL) Take 1 tablet by mouth two times a day. MUST HAVE OFFICE VISIT FOR FURTHER REFILLS! - Signed Rx of BENTYL 20 MG TABS (DICYCLOMINE HCL) Take 1 tablet by mouth two times a day. MUST HAVE OFFICE VISIT FOR FURTHER REFILLS!;  #60 x 0;  Signed;  Entered by: Lamona Curl CMA (AAMA);  Authorized by: Hart Carwin MD;  Method used: Faxed to [facility], [address] [place], [place], [place]  [address], Ph: [phone], Fax: [phone]    Prescriptions: BENTYL 20 MG TABS (DICYCLOMINE HCL) Take 1 tablet by mouth two times a day. MUST HAVE OFFICE VISIT FOR FURTHER REFILLS!  #60 x 0   Entered by:   Lamona Curl CMA (AAMA)   Authorized by:   Hart Carwin MD   Signed by:   Lamona Curl CMA (AAMA) on 03/15/2010   Method used:   Faxed to ...       [facility] (retail)       [address]       [place], [place]  [address]       Ph: [phone]       Fax: [phone]   RxID:   [id number]

## 2010-07-25 NOTE — Letter (Signed)
Summary: CMN/[facility]  CMN/[facility]   Imported By: Lester [place] 07/18/2009 16:17:06  _____________________________________________________________________  External Attachment:    Type:   Image     Comment:   External Document

## 2010-07-25 NOTE — Progress Notes (Signed)
Summary: TRIAGE  Phone Note Call from Patient Call back at Home Phone [phone]   Caller: Patient Call For: Lion Fernandez Reason for Call: Talk to Nurse Summary of Call: Patient  states that Donnatel was not covered by her insurance would like something else called in  Initial call taken by: Tawni Levy,  July 29, 2009 3:49 PM  Follow-up for Phone Call        DR.Stratton Villwock PLEASE ADVISE  Follow-up by: Laureen Ochs LPN,  July 29, 2009 4:02 PM  Additional Follow-up for Phone Call Additional follow up Details #1::        I am running out of ideas. She has an IBS. We can try Probiotics OTC, generic brand and also we can try Peptobismol liquid ( makes the stool look black), 1 tablsp after each meal take for 1 week Additional Follow-up by: Hart Carwin MD,  July 30, 2009 5:58 PM    Additional Follow-up for Phone Call Additional follow up Details #2::    Above MD orders reviewed with patient. Pt. instructed to call back as needed.  Follow-up by: Laureen Ochs LPN,  August 01, 2009 9:19 AM

## 2010-07-25 NOTE — Progress Notes (Signed)
Summary: humidity for vent  Phone Note From Other Clinic   Caller: [email or web address]  Call For: Blue Ruggerio Summary of Call: pt uses high settings on her humidifier for vent and water collects in it needs an order for heated wire circuts for increased heat use for ventolator Initial call taken by: Oneita Jolly,  Oct 27, 2009 3:59 PM  Follow-up for Phone Call        Will place the order. Follow-up by: Coralyn Helling MD,  Oct 27, 2009 4:22 PM

## 2010-07-25 NOTE — Progress Notes (Signed)
Summary: Triage  Phone Note Call from Patient   Caller: Patient  [phone] Call For: Dr. Juanda Chance Reason for Call: Talk to Nurse Summary of Call: Pt is having abdominal pain and does not want to wait until next avail. appt. to be seen Initial call taken by: Karna Christmas,  July 25, 2009 8:46 AM  Follow-up for Phone Call        X1 week pt. c/o intermittent lower abd. pain, gets worse after she eats. Nausea, bloating, stools are loose.  Denies blood, black stools, fever. States she takes Bentyl 20mg  two times a day, Questran daily-PRN, Lomotil 2 three times a day-PRN. Never started Donnagel, she will call her pharmacy to get filled. She declines an appt. with Amy or Gunnar Fusi, only wants to see Dr.Daleiza Bacchi. She has scheduled an appt. for 08-17-09 at 11:30am.   1) Take above meds as directed 2) Soft,bland diet. No spicy,greasy,fried foods. 3) If symptoms become worse call back immediately or go to ER.  Follow-up by: Laureen Ochs LPN,  July 25, 2009 10:08 AM  Additional Follow-up for Phone Call Additional follow up Details #1::        Donnagel liquid is effective for her IBS. Also she may benefit from Flagyl 250 mg, #21 , 1 by mouth three times a day x 1 week. Continue Questran 4gm/day. Additional Follow-up by: Hart Carwin MD,  July 25, 2009 9:25 PM    Additional Follow-up for Phone Call Additional follow up Details #2::    Patient  notified of the above.  patient attempted to fill donnagel, she says there this is no longer manufactured.  I will call in flagyl.  Dr Juanda Chance do you want to substitute for donnagel Follow-up by: Darcey Nora RN, CGRN,  July 26, 2009 4:03 PM  Additional Follow-up for Phone Call Additional follow up Details #3:: Details for Additional Follow-up Action Taken: Donnatal 1 by mouth qid ac, #120, 1 refill Additional Follow-up by: Hart Carwin MD,  July 26, 2009 4:34 PM  New/Updated Medications: DONNATAL  TABS (BELLADONNA ALK-PHENOBARBITAL) Take one  before breakfast, lunch, dinner and at bedtime. METRONIDAZOLE 250 MG TABS (METRONIDAZOLE) 1 by mouth tid Prescriptions: DONNATAL  TABS (BELLADONNA ALK-PHENOBARBITAL) Take one before breakfast, lunch, dinner and at bedtime.  #120 x 1   Entered by:   Laureen Ochs LPN   Authorized by:   Hart Carwin MD   Signed by:   Laureen Ochs LPN on 78/93/8101   Method used:   Printed then faxed to ...       [facility] (retail)       [address]       [place], [place]  [address]       Ph: [phone]       Fax: [phone]   RxID:   [id number] METRONIDAZOLE 250 MG TABS (METRONIDAZOLE) 1 by mouth tid  #21 x 0   Entered by:   Darcey Nora RN, CGRN   Authorized by:   Hart Carwin MD   Signed by:   Darcey Nora RN, CGRN on 07/26/2009   Method used:   Print then Give to Patient   RxID:   [id number]  Above MD orders reviewed with patient. Donnatal to pt. pharmacy. Pt. to keep scheduled office visit. Laureen Ochs LPN  July 27, 2009 8:27 AM

## 2010-07-25 NOTE — Miscellaneous (Signed)
Summary: lomotil Rx  Clinical Lists Changes  Medications: Changed medication from LOMOTIL 2.5-0.025 MG TABS (DIPHENOXYLATE-ATROPINE) Take 1-2 tablets by mouth three times a day not to exceed 6 per day. to LOMOTIL 2.5-0.025 MG TABS (DIPHENOXYLATE-ATROPINE) Take 1-2 tablets by mouth three times a day not to exceed 6 per day. MUST HAVE OFFICE VISIT FOR FURTHER REFILLS! - Signed Rx of LOMOTIL 2.5-0.025 MG TABS (DIPHENOXYLATE-ATROPINE) Take 1-2 tablets by mouth three times a day not to exceed 6 per day. MUST HAVE OFFICE VISIT FOR FURTHER REFILLS!;  #120 x 1;  Signed;  Entered by: Lamona Curl CMA (AAMA);  Authorized by: Hart Carwin MD;  Method used: Printed then faxed to [facility], [address] [place], [place], [place]  [address], Ph: [phone], Fax: [phone]    Prescriptions: LOMOTIL 2.5-0.025 MG TABS (DIPHENOXYLATE-ATROPINE) Take 1-2 tablets by mouth three times a day not to exceed 6 per day. MUST HAVE OFFICE VISIT FOR FURTHER REFILLS!  #120 x 1   Entered by:   Lamona Curl CMA (AAMA)   Authorized by:   Hart Carwin MD   Signed by:   Lamona Curl CMA (AAMA) on 12/16/2009   Method used:   Printed then faxed to ...       [facility] (retail)       [address]       [place], [place]  [address]       Ph: [phone]       Fax: [phone]   RxID:   [id number]

## 2010-07-25 NOTE — Progress Notes (Signed)
Summary: info-ATC NA   Phone Note Call from Patient Call back at Home Phone [phone]   Caller: Patient Call For: Tavious Griesinger Reason for Call: Talk to Nurse Summary of Call: have appt w/Dr. Luretha Murphy 08/10 & hoping he will do something about weight problem.  Any info you can send would be beneficial to her.   Initial call taken by: Eugene Gavia,  December 30, 2009 4:21 PM  Follow-up for Phone Call        Pt needs to come to medical records and sign release for her records.  ATC NA and no answering machine, WCB. Vernie Murders  December 30, 2009 4:28 PM    pt returned the call---angela will tell the pt that she will need to come to medical records to sign a release form for these records.  angela informed pt of this. Randell Loop CMA  December 30, 2009 4:31 PM

## 2010-07-25 NOTE — Miscellaneous (Signed)
Summary: Revised plan/[facility]  Revised plan/[facility]   Imported By: Lester [place] 02/15/2010 07:57:36  _____________________________________________________________________  External Attachment:    Type:   Image     Comment:   External Document

## 2010-07-25 NOTE — Assessment & Plan Note (Signed)
Summary: 6 months/apc   Visit Type:  Follow-up Copy to:  Jphn Orion Crook Primary Provider/Referring Provider:  Maurice Small, MD  CC:  OSA...patient says she is doing well...would like to discuss having possible weight loss surgery.  History of Present Illness: 50 yo female with OSA/OHS s/p trach on nocturnal vent, Asthma  She is being evaluated for bariatric surgery with Dr. Daphine Deutscher.  She has been working on her diet and exercise.  She has lost 60 lbs since 2009.  She is not having any problems with her breathing at present.  She does not need to use her nebulizer that much.  She has been getting more trouble with her sleep related to sweats at night.  She thinks she is going through menopause.  She is not having any trouble with her trach or ventilator at night.    Current Medications (verified): 1)  Buspar 5 Mg  Tabs (Buspirone Hcl) .[name].. 1 1/2 Tablets Two Times A Day 2)  Topamax 50 Mg  Tabs (Topiramate) .... Once Daily 3)  Alprazolam 0.5 Mg  Tabs (Alprazolam) .... Take 1 Tablet By Mouth Two Times A Day As Needed 4)  Cardizem 120 Mg  Tabs (Diltiazem Hcl) .... Take One Tablet By Mouth Daily. 5)  Diovan 160 Mg Tabs (Valsartan) .... Take 1 By Mouth Once Daily 6)  Zaroxolyn 2.5 Mg  Tabs (Metolazone) .... Take 1 Tablet 3 X Every Week 7)  Klor-Con M20 20 Meq  Tbcr (Potassium Chloride Crys Cr) .... Three Times A Day 8)  Warfarin Sodium 7.5 Mg Tabs (Warfarin Sodium) .... As Directed 9)  Mysoline 50 Mg  Tabs (Primidone) .... 1/2 in The Morning and 1 1/2 Qhs 10)  Oxygen 4lpm .... At Bedtime 11)  Claritin 10 Mg Tabs (Loratadine) .... Once Daily As Needed 12)  Budesonide 0.25 Mg/13ml  Susp (Budesonide) .... As Needed 13)  Albuterol Sulfate (2.5 Mg/74ml) 0.083%  Nebu (Albuterol Sulfate) .... Inhale 1 Vial Via Hhn Every 4 Hrs As Needed 14)  Omeprazole 40 Mg  Cpdr (Omeprazole) .... Take 1 Capsule By Mouth Once A Day 15)  Vicodin 5-500 Mg  Tabs (Hydrocodone-Acetaminophen) .... As  Needed 16)  Percocet 5-325 Mg  Tabs (Oxycodone-Acetaminophen) .... As Needed 17)  Celebrex 100 Mg Caps (Celecoxib) .[name].. 1 By Mouth Daily 18)  Neurontin 100 Mg  Caps (Gabapentin) .... Three Times A Day 19)  Toviaz 4 Mg Xr24h-Tab (Fesoterodine Fumarate) .... Once Daily 20)  Cholestyramine 4 Gm  Pack (Cholestyramine) .... Take One By Mouth Once Daily As Needed 21)  Amaryl 4 Mg  Tabs (Glimepiride) .... Two Times A Day 22)  Lantus Solostar 100 Unit/ml  Soln (Insulin Glargine) .... Sliding Scale 23)  Lomotil 2.5-0.025 Mg Tabs (Diphenoxylate-Atropine) .... Take 1-2 Tablets By Mouth Three Times A Day Not To Exceed 6 Per Day. Must Have Office Visit For Further Refills! 24)  Promethazine Hcl 25 Mg  Tabs (Promethazine Hcl) .... One Tablet Four Times A Day As Needed 25)  Meclizine Hcl 25 Mg  Tabs (Meclizine Hcl) .... One Tablet Three Times A Day As Needed 26)  Parafon Forte Dsc 500 Mg  Tabs (Chlorzoxazone) .... One Tablet Four Times Daily.prn 27)  Vitamin D3 2000 Unit Caps (Cholecalciferol) .[name].. 1 By Mouth Daily 28)  Mucinex 600 Mg  Xr12h-Tab (Guaifenesin) .... One Tablet Three Times A Day 29)  Fish Oil   Oil (Fish Oil) .[name].. 1 By Mouth Four Times Daily 30)  Anti-Gas Ultra Strength 180 Mg Caps (Simethicone) .Marland KitchenMarland KitchenMarland Kitchen  As Needed 31)  Bentyl 20 Mg Tabs (Dicyclomine Hcl) .... Take 1 Tablet By Mouth Two Times A Day. Must Have Office Visit For Further Refills! 32)  Cymbalta 30 Mg Cpep (Duloxetine Hcl) .[name].. 1 By Mouth Two Times A Day 33)  Bumetanide 1 Mg Tabs (Bumetanide) .... 2 Tabs in Am and 2 Tabs At Noon 34)  Novolog Flexpen 100 Unit/ml Soln (Insulin Aspart) .[name].. 14 Units in The Am and 14 Units in The Pm 35)  Uloric 40 Mg Tabs (Febuxostat) .[name].. 1 Tablet By Mouth Once Daily 36)  Multivitamins  Caps (Multiple Vitamin) .[name].. 1 By Mouth Daily 37)  Probiotic  Caps (Probiotic Product) .... 2 By Mouth Daily 38)  Septra Ds 800-160 Mg Tabs (Sulfamethoxazole-Trimethoprim) .[name].. 1 By Mouth Two Times A Day  Allergies  (verified): 1)  ! Augmentin 2)  ! Cipro 3)  ! Codeine 4)  ! Morphine 5)  ! Sulfa 6)  ! * Nitro 7)  ! Flagyl 8)  ! Allopurinol 9)  * Allegra  Past History:  Past Medical History: OSA, OHS      - Nocturnal ventilator from May 2010      - Pressure support 13 and PEEP 5 with FiO2 50% Chronic tracheostomy Morbid obesity Mild, intermittent asthma Dyslipidemia CHF Depression DM  Past Surgical History: Reviewed history from 12/22/2008 and no changes required. Cholecystectomy skin graft d & c  Tracheostomy  Vital Signs:  Patient profile:   50 year old female Height:      68 inches (172.72 cm) Weight:      436 pounds (198.18 kg) BMI:     66.53 O2 Sat:      96 % on Room air Temp:     98.2 degrees F (36.78 degrees C) oral Pulse rate:   80 / minute BP sitting:   132 / 70  (left arm) Cuff size:   large  Vitals Entered By: Michel Bickers CMA (April 27, 2010 10:11 AM)  O2 Sat at Rest %:  96 O2 Flow:  Room air CC: OSA...patient says she is doing well...would like to discuss having possible weight loss surgery Comments Medications reviewed with patient Michel Bickers CMA  April 27, 2010 10:21 AM  `  Physical Exam  General:  obese.   Nose:  clear nasal discharge.   Mouth:  MP 4, no oral lesions Neck:  trach site clean Lungs:  diminished breath sounds, no wheezing or rales Heart:  regular rhythm, normal rate, and no murmurs.   Extremities:  2+ non-pitting edema Neurologic:  alert & oriented X3 and strength normal in all extremities.   Cervical Nodes:  No lymphadenopathy noted Psych:  normally interactive.    ``````````````````````````````````````````````````````````````````````````````````````````````````````````````````````````````````````````````````````````````````````````````````````````````````````````````````````````````````````````````````````````````````````````````````````````````````````````````````````````````````````````````````````````````````````````````````````````````````````````````````````````````````````  Impression & Recommendations:  Problem # 1:  OBSTRUCTIVE SLEEP APNEA (ICD-327.23)  She has done well with tracheostomy and nocturnal ventilation.  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  Problem # 3:  MORBID OBESITY (ICD-278.01)  She is being evaluated for bariatric surgery.  Clearly her weight is the main problem with much of her health problems.  She has lost some weight with increased exercise and diet modification, and I have encouraged her to continue with this.  However, progress with this has been slow.  Given the multitude of medical problems she is dealing with related to her weight, I agree that bariatric surgery will likely be her best option at this point.  Problem # 4:  TRACHEOSTOMY STATUS (ICD-V44.0)  Her trach site  looks good.  Problem # 5:  ASTHMA, MILD (ICD-493.90) Continue as needed nebulizer.  Problem # 6:  ALLERGIC RHINITIS (ICD-477.9)  Stable.  Medications Added to Medication List This Visit: 1)  Vitamin D3 2000 Unit Caps (Cholecalciferol) .[name].. 1 by mouth daily 2)  Fish Oil Oil (Fish oil) .[name].. 1 by mouth four times daily 3)  Septra Ds 800-160 Mg Tabs (Sulfamethoxazole-trimethoprim) .[name].. 1 by mouth two times a day  Complete Medication List: 1)  Buspar 5 Mg Tabs (Buspirone hcl) .[name].. 1 1/2 tablets two times a day 2)  Topamax 50 Mg Tabs (Topiramate) .... Once daily 3)  Alprazolam 0.5 Mg Tabs (Alprazolam) .... Take 1 tablet by mouth two times a day as needed 4)  Cardizem 120 Mg Tabs (Diltiazem hcl) .... Take one  tablet by mouth daily. 5)  Diovan 160 Mg Tabs (Valsartan) .... Take 1 by mouth once daily 6)  Zaroxolyn 2.5 Mg Tabs (Metolazone) .... Take 1 tablet 3 x every week 7)  Klor-con M20 20 Meq Tbcr (Potassium chloride crys cr) .... Three times a day 8)  Warfarin Sodium 7.5 Mg Tabs (Warfarin sodium) .... As directed 9)  Mysoline 50 Mg Tabs (Primidone) .... 1/2 in the morning and 1 1/2 qhs 10)  Oxygen 4lpm  .... At bedtime 11)  Claritin 10 Mg Tabs (Loratadine) .... Once daily as needed 12)  Budesonide 0.25 Mg/3ml Susp (Budesonide) .... As needed 13)  Albuterol Sulfate (2.5 Mg/82ml) 0.083% Nebu (Albuterol sulfate) .... Inhale 1 vial via hhn every 4 hrs as needed 14)  Omeprazole 40 Mg Cpdr (Omeprazole) .... Take 1 capsule by mouth once a day 15)  Vicodin 5-500 Mg Tabs (Hydrocodone-acetaminophen) .... As needed 16)  Percocet 5-325 Mg Tabs (Oxycodone-acetaminophen) .... As needed 17)  Celebrex 100 Mg Caps (Celecoxib) .[name].. 1 by mouth daily 18)  Neurontin 100 Mg Caps (Gabapentin) .... Three times a day 19)  Toviaz 4 Mg Xr24h-tab (Fesoterodine fumarate) .... Once daily 20)  Cholestyramine 4 Gm Pack (Cholestyramine) .... Take one by mouth once daily as needed 21)  Amaryl 4 Mg Tabs (Glimepiride) .... Two times a day 22)  Lantus Solostar 100 Unit/ml Soln (Insulin glargine) .... Sliding scale 23)  Lomotil 2.5-0.025 Mg Tabs (Diphenoxylate-atropine) .... Take 1-2 tablets by mouth three times a day not to exceed 6 per day. must have office visit for further refills! 24)  Promethazine Hcl 25 Mg Tabs (Promethazine hcl) .... One tablet four times a day as needed 25)  Meclizine Hcl 25 Mg Tabs (Meclizine hcl) .... One tablet three times a day as needed 26)  Parafon Forte Dsc 500 Mg Tabs (Chlorzoxazone) .... One tablet four times daily.prn 27)  Vitamin D3 2000 Unit Caps (Cholecalciferol) .[name].. 1 by mouth daily 28)  Mucinex 600 Mg Xr12h-tab (Guaifenesin) .... One tablet three times a day 29)  Fish Oil Oil (Fish oil) .[name]..  1 by mouth four times daily 30)  Anti-gas Ultra Strength 180 Mg Caps (Simethicone) .... As needed 31)  Bentyl 20 Mg Tabs (Dicyclomine hcl) .... Take 1 tablet by mouth two times a day. must have office visit for further refills! 32)  Cymbalta 30 Mg Cpep (Duloxetine hcl) .[name].. 1 by mouth two times a day 33)  Bumetanide 1 Mg Tabs (Bumetanide) .... 2 tabs in am and 2 tabs at noon 34)  Novolog Flexpen 100 Unit/ml Soln (Insulin aspart) .[name].. 14 units in the am and 14 units in the pm 35)  Uloric 40 Mg Tabs (Febuxostat) .[name].. 1 tablet by  mouth once daily 36)  Multivitamins Caps (Multiple vitamin) .[name].. 1 by mouth daily 37)  Probiotic Caps (Probiotic product) .... 2 by mouth daily 38)  Septra Ds 800-160 Mg Tabs (Sulfamethoxazole-trimethoprim) .[name].. 1 by mouth two times a day  Other Orders: Est. Patient Level III ([address])  Patient Instructions: 1)  Follow up in 6 months   Immunization History:  Influenza Immunization History:    Influenza:  historical (03/25/2010)

## 2010-07-25 NOTE — Assessment & Plan Note (Signed)
Summary: rov   Visit Type:  Follow-up Copy to:  Jolayne Haines Primary Provider/Referring Provider:  Maurice Small, MD  CC:  OSA.  The patient c/o increased SOB with activity and when the weather changes. She is having no problems with ventilator.[name]  History of Present Illness: 50 yo female with OSA/OHS s/p trach on nocturnal vent, Asthma  She has been doing well with her breathing.  She is not having much cough, wheeze, or sputum.  She is sleeping well, and not having any problems with her trach.  [facility] her home [facility] set up.  She has not been using her nebulizer.  She does have more sinus congestion, and itchy eyes with the change of the season.   Current Medications (verified): 1)  Buspar 5 Mg  Tabs (Buspirone Hcl) .[name].. 1 1/2 Tablets Two Times A Day 2)  Topamax 50 Mg  Tabs (Topiramate) .... Once Daily 3)  Alprazolam 0.5 Mg  Tabs (Alprazolam) .... Take 1 Tablet By Mouth Two Times A Day As Needed 4)  Cardizem 120 Mg  Tabs (Diltiazem Hcl) .... Take One Tablet By Mouth Daily. 5)  Diovan 160 Mg Tabs (Valsartan) .... Take 1 By Mouth Once Daily 6)  Zaroxolyn 2.5 Mg  Tabs (Metolazone) .... Take 1 Tablet 3 X Every Week 7)  Klor-Con M20 20 Meq  Tbcr (Potassium Chloride Crys Cr) .... Three Times A Day 8)  Warfarin Sodium 7.5 Mg Tabs (Warfarin Sodium) .... As Directed 9)  Mysoline 50 Mg  Tabs (Primidone) .... 1/2 in The Morning and 1 1/2 Qhs 10)  Oxygen 4lpm .... At Bedtime 11)  Claritin 10 Mg Tabs (Loratadine) .... Once Daily As Needed 12)  Budesonide 0.25 Mg/60ml  Susp (Budesonide) .... As Needed 13)  Albuterol Sulfate (2.5 Mg/59ml) 0.083%  Nebu (Albuterol Sulfate) .... Inhale 1 Vial Via Hhn Every 4 Hrs As Needed 14)  Omeprazole 40 Mg  Cpdr (Omeprazole) .... Take 1 Capsule By Mouth Once A Day 15)  Vicodin 5-500 Mg  Tabs (Hydrocodone-Acetaminophen) .... As Needed 16)  Percocet 5-325 Mg  Tabs (Oxycodone-Acetaminophen) .... As Needed 17)  Celebrex 100 Mg Caps (Celecoxib) .[name]..  1 By Mouth Daily 18)  Neurontin 100 Mg  Caps (Gabapentin) .... Three Times A Day 19)  Toviaz 4 Mg Xr24h-Tab (Fesoterodine Fumarate) .... Once Daily 20)  Cholestyramine 4 Gm  Pack (Cholestyramine) .... Take One By Mouth Once Daily As Needed 21)  Amaryl 4 Mg  Tabs (Glimepiride) .... Two Times A Day 22)  Lantus Solostar 100 Unit/ml  Soln (Insulin Glargine) .... Sliding Scale 23)  Lomotil 2.5-0.025 Mg Tabs (Diphenoxylate-Atropine) .... Take 1-2 Tablets By Mouth Three Times A Day Not To Exceed 6 Per Day. 24)  Promethazine Hcl 25 Mg  Tabs (Promethazine Hcl) .... One Tablet Four Times A Day As Needed 25)  Meclizine Hcl 25 Mg  Tabs (Meclizine Hcl) .... One Tablet Three Times A Day As Needed 26)  Parafon Forte Dsc 500 Mg  Tabs (Chlorzoxazone) .... One Tablet Four Times Daily.prn 27)  Vitamin D (Ergocalciferol) 50000 Unit Caps (Ergocalciferol) .... Once Weekly 28)  Mucinex 600 Mg  Xr12h-Tab (Guaifenesin) .... One Tablet Three Times A Day 29)  Fish Oil   Oil (Fish Oil) .[name].. 1 Three Times A Day 30)  Anti-Gas Ultra Strength 180 Mg Caps (Simethicone) .... As Needed 31)  Bentyl 20 Mg Tabs (Dicyclomine Hcl) .... Take 1 Tablet By Mouth Two Times A Day 32)  Cymbalta 30 Mg Cpep (Duloxetine Hcl) .Marland KitchenMarland KitchenMarland Kitchen  1 By Mouth Two Times A Day 33)  Bumetanide 1 Mg Tabs (Bumetanide) .... 2 Tabs in Am and 2 Tabs At Noon 34)  Novolog Flexpen 100 Unit/ml Soln (Insulin Aspart) .[name].. 14 Units in The Am and 14 Units in The Pm 35)  Uloric 40 Mg Tabs (Febuxostat) .[name].. 1 Tablet By Mouth Once Daily 36)  Multivitamins  Caps (Multiple Vitamin) .[name].. 1 By Mouth Daily 37)  Probiotic  Caps (Probiotic Product) .... 2 By Mouth Daily  Allergies (verified): 1)  ! Augmentin 2)  ! Cipro 3)  ! Codeine 4)  ! Morphine 5)  ! Sulfa 6)  ! * Nitro 7)  ! Flagyl 8)  ! Allopurinol 9)  * Allegra  Past History:  Past Medical History: OSA, OHS      - Nocturnal ventilator from May 2010      - Pressure support 13 and PEEP 5 with FiO2 50% Chronic  tracheostomy Morbid obesity Asthma Dyslipidemia CHF Depression DM  Past Surgical History: Reviewed history from 12/22/2008 and no changes required. Cholecystectomy skin graft d & c  Tracheostomy  Vital Signs:  Patient profile:   50 year old female Height:      68 inches (172.72 cm) O2 Sat:      96 % on Room air Temp:     98.0 degrees F (36.67 degrees C) oral Pulse rate:   86 / minute BP sitting:   132 / 86  (right arm) Cuff size:   large  Vitals Entered By: Michel Bickers CMA (Oct 31, 2009 10:12 AM)  O2 Sat at Rest %:  96 O2 Flow:  Room air  CC: OSA.  The patient c/o increased SOB with activity and when the weather changes. She is having no problems with ventilator. Comments Unable to weigh patient due to scale capacity. Medications reviewed. Daytime phone number verified. Michel Bickers CMA  Oct 31, 2009 10:13 AM   Physical Exam  General:  obese.   Nose:  clear nasal discharge.   Mouth:  MP 4, no oral lesions Neck:  trach site clean Lungs:  diminished breath sounds, no wheezing or rales Heart:  regular rhythm, normal rate, and no murmurs.   Extremities:  2+ non-pitting edema   Impression & Recommendations:  Problem # 1:  OBSTRUCTIVE SLEEP APNEA (ICD-327.23)  She is to continue with her tracheostomy.  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  Problem # 3:  TRACHEOSTOMY STATUS (ICD-V44.0)  She is followed by Dr. Jearld Fenton.  Problem # 4:  MORBID OBESITY (ICD-278.01)  She will contact our office if she needs her medical records for evaluation of bariatric surgery options.  Problem # 5:  ASTHMA, MILD (ICD-493.90) She is to continue on as needed pulmicort and albuterol.  Problem # 6:  ALLERGIC RHINITIS (ICD-477.9)  Continue as needed claritin.  Medications Added to Medication List This Visit: 1)  Celebrex 100 Mg Caps (Celecoxib) .[name].. 1 by mouth daily 2)  Parafon Forte Dsc 500 Mg Tabs  (Chlorzoxazone) .... One tablet four times daily.prn 3)  Cymbalta 30 Mg Cpep (Duloxetine hcl) .[name].. 1 by mouth two times a day 4)  Multivitamins Caps (Multiple vitamin) .[name].. 1 by mouth daily 5)  Probiotic Caps (Probiotic product) .... 2 by mouth daily  Complete Medication List: 1)  Buspar 5 Mg Tabs (Buspirone hcl) .[name].. 1 1/2 tablets two times a day 2)  Topamax 50 Mg Tabs (Topiramate) .... Once daily 3)  Alprazolam 0.5 Mg Tabs (Alprazolam) .... Take 1 tablet by mouth two times a day as needed 4)  Cardizem 120 Mg Tabs (Diltiazem hcl) .... Take one tablet by mouth daily. 5)  Diovan 160 Mg Tabs (Valsartan) .... Take 1 by mouth once daily 6)  Zaroxolyn 2.5 Mg Tabs (Metolazone) .... Take 1 tablet 3 x every week 7)  Klor-con M20 20 Meq Tbcr (Potassium chloride crys cr) .... Three times a day 8)  Warfarin Sodium 7.5 Mg Tabs (Warfarin sodium) .... As directed 9)  Mysoline 50 Mg Tabs (Primidone) .... 1/2 in the morning and 1 1/2 qhs 10)  Oxygen 4lpm  .... At bedtime 11)  Claritin 10 Mg Tabs (Loratadine) .... Once daily as needed 12)  Budesonide 0.25 Mg/67ml Susp (Budesonide) .... As needed 13)  Albuterol Sulfate (2.5 Mg/15ml) 0.083% Nebu (Albuterol sulfate) .... Inhale 1 vial via hhn every 4 hrs as needed 14)  Omeprazole 40 Mg Cpdr (Omeprazole) .... Take 1 capsule by mouth once a day 15)  Vicodin 5-500 Mg Tabs (Hydrocodone-acetaminophen) .... As needed 16)  Percocet 5-325 Mg Tabs (Oxycodone-acetaminophen) .... As needed 17)  Celebrex 100 Mg Caps (Celecoxib) .[name].. 1 by mouth daily 18)  Neurontin 100 Mg Caps (Gabapentin) .... Three times a day 19)  Toviaz 4 Mg Xr24h-tab (Fesoterodine fumarate) .... Once daily 20)  Cholestyramine 4 Gm Pack (Cholestyramine) .... Take one by mouth once daily as needed 21)  Amaryl 4 Mg Tabs (Glimepiride) .... Two times a day 22)  Lantus Solostar 100 Unit/ml Soln (Insulin glargine) .... Sliding scale 23)  Lomotil 2.5-0.025 Mg Tabs (Diphenoxylate-atropine) .... Take 1-2  tablets by mouth three times a day not to exceed 6 per day. 24)  Promethazine Hcl 25 Mg Tabs (Promethazine hcl) .... One tablet four times a day as needed 25)  Meclizine Hcl 25 Mg Tabs (Meclizine hcl) .... One tablet three times a day as needed 26)  Parafon Forte Dsc 500 Mg Tabs (Chlorzoxazone) .... One tablet four times daily.prn 27)  Vitamin D (ergocalciferol) 50000 Unit Caps (Ergocalciferol) .... Once weekly 28)  Mucinex 600 Mg Xr12h-tab (Guaifenesin) .... One tablet three times a day 29)  Fish Oil Oil (Fish oil) .[name].. 1 three times a day 30)  Anti-gas Ultra Strength 180 Mg Caps (Simethicone) .... As needed 31)  Bentyl 20 Mg Tabs (Dicyclomine hcl) .... Take 1 tablet by mouth two times a day 32)  Cymbalta 30 Mg Cpep (Duloxetine hcl) .[name].. 1 by mouth two times a day 33)  Bumetanide 1 Mg Tabs (Bumetanide) .... 2 tabs in am and 2 tabs at noon 34)  Novolog Flexpen 100 Unit/ml Soln (Insulin aspart) .[name].. 14 units in the am and 14 units in the pm 35)  Uloric 40 Mg Tabs (Febuxostat) .[name].. 1 tablet by mouth once daily 36)  Multivitamins Caps (Multiple vitamin) .[name].. 1 by mouth daily 37)  Probiotic Caps (Probiotic product) .... 2 by mouth daily  Other Orders: Est. Patient Level III (60454)  Patient Instructions: 1)  Follow up in 6 months   Immunization History:  Influenza Immunization History:    Influenza:  historical (02/23/2009)  Pneumovax Immunization History:    Pneumovax:  historical (02/23/2009)

## 2010-07-25 NOTE — Assessment & Plan Note (Signed)
Summary: ABD. 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 She is c/o sudden and urgent B.M's. Recent colonoscopy was normal. She is morbidly obese, weight has decreased from 364 lbs to 344 lbs.She saw Monica Becton PA and was given course of Flagyl.   GI Review of Systems    Reports abdominal pain.     Location of  Abdominal pain: lower abdomen.    Denies acid reflux, belching, bloating, chest pain, dysphagia with liquids, dysphagia with solids, heartburn, loss of appetite, nausea, vomiting, vomiting blood, weight loss, and  weight gain.      Reports diarrhea.     Denies anal fissure, black tarry stools, change in bowel habit, constipation, diverticulosis, fecal incontinence, heme positive stool, hemorrhoids, irritable bowel syndrome, jaundice, light color stool, liver problems, rectal bleeding, and  rectal pain.    Current Medications (verified): 1)  Buspar 5 Mg  Tabs (Buspirone Hcl) .[name].. 1 1/2 Tablets Two Times A Day 2)  Topamax 50 Mg  Tabs (Topiramate) .... Once Daily 3)  Alprazolam 0.5 Mg  Tabs (Alprazolam) .... Take 1 Tablet By Mouth Two Times A Day As Needed 4)  Cardizem 120 Mg  Tabs (Diltiazem Hcl) .... Take One Tablet By Mouth Daily. 5)  Diovan 160 Mg Tabs (Valsartan) .... Take 1 By Mouth Once Daily 6)  Zaroxolyn 2.5 Mg  Tabs (Metolazone) .... Take 1 Tablet 3 X Every Week 7)  Klor-Con M20 20 Meq  Tbcr (Potassium Chloride Crys Cr) .... Three Times A Day 8)  Warfarin Sodium 7.5 Mg Tabs (Warfarin Sodium) .... As Directed 9)  Mysoline 50 Mg  Tabs (Primidone) .... 1/2 in The Morning and 1 1/2 Qhs 10)  Oxygen 4lpm .... At  Bedtime 11)  Claritin 10 Mg Tabs (Loratadine) .... Once Daily As Needed 12)  Budesonide 0.25 Mg/25ml  Susp (Budesonide) .... As Needed 13)  Albuterol Sulfate (2.5 Mg/45ml) 0.083%  Nebu (Albuterol Sulfate) .... Inhale 1 Vial Via Hhn Every 4 Hrs As Needed 14)  Omeprazole 40 Mg  Cpdr (Omeprazole) .... Take 1 Capsule By Mouth Once A Day 15)  Vicodin 5-500 Mg  Tabs (Hydrocodone-Acetaminophen) .... As Needed 16)  Percocet 5-325 Mg  Tabs (Oxycodone-Acetaminophen) .... As Needed 17)  Mobic 7.5 Mg  Tabs (Meloxicam) .... Two Times A Day 18)  Neurontin 100 Mg  Caps (Gabapentin) .... Three Times A Day 19)  Toviaz 4 Mg Xr24h-Tab (Fesoterodine Fumarate) .... Once Daily 20)  Cholestyramine 4 Gm  Pack (Cholestyramine) .... Take One By Mouth Once Daily As Needed 21)  Amaryl 4 Mg  Tabs (Glimepiride) .... Two Times A Day 22)  Lantus Solostar 100 Unit/ml  Soln (Insulin Glargine) .... Sliding Scale 23)  Lomotil 2.5-0.025 Mg Tabs (Diphenoxylate-Atropine) .... Take 1-2 Tablets By Mouth Three Times A Day Not To Exceed 6 Per Day. 24)  Promethazine Hcl 25 Mg  Tabs (Promethazine Hcl) .... One Tablet Four Times A Day As Needed 25)  Meclizine Hcl 25 Mg  Tabs (Meclizine Hcl) .... One Tablet Three Times A Day As Needed 26)  Parafon Forte Dsc 500 Mg  Tabs (Chlorzoxazone) .... One Tablet Four Times Daily. 27)  Vitamin D (Ergocalciferol) 50000 Unit Caps (Ergocalciferol) .... Once Weekly 28)  Mucinex 600 Mg  Xr12h-Tab (Guaifenesin) .... One Tablet Three Times A Day 29)  Fish Oil   Oil (Fish Oil) .[name].. 1 Three Times A Day 30)  Cvs Garlic Odorless  Tabs (Garlic) .[name].. 1 Once Daily 31)  Anti-Gas Ultra Strength 180 Mg Caps (Simethicone) .... As Needed 32)  Bentyl 20 Mg Tabs (Dicyclomine Hcl) .... Take 1 Tablet By Mouth Two Times A Day 33)  Wellbutrin Sr 150 Mg Xr12h-Tab (Bupropion Hcl) .[name].. 1 Tablet By Mouth Once Daily 34)  Bumetanide 1 Mg Tabs (Bumetanide) .... 2 Tabs in Am and 2 Tabs At Noon 35)  Novolog Flexpen 100 Unit/ml Soln  (Insulin Aspart) .[name].. 14 Units in The Am and 14 Units in The Pm 36)  Uloric 40 Mg Tabs (Febuxostat) .[name].. 1 Tablet By Mouth Once Daily 37)  Nystatin 100000 Unit/gm Crea (Nystatin) .... Prn 38)  Donnatal  Tabs (Belladonna Alk-Phenobarbital) .... Take One Before Breakfast, Lunch, Dinner and At Bedtime.  Allergies: 1)  ! Augmentin 2)  ! Cipro 3)  ! Codeine 4)  ! Morphine 5)  ! Sulfa 6)  ! * Nitro 7)  ! Flagyl 8)  ! Allopurinol 9)  * Allegra  Past History:  Past Medical History: Reviewed history from 12/22/2008 and no changes required. OSA, OHS      - Nocturnal ventilator from May 2010      - Pressure support 13 and PEEP 5 with FiO2 50% Chronic tracheostomy Morbid obesity Dyslipidemia CHF Depression DM  Past Surgical History: Reviewed history from 12/22/2008 and no changes required. Cholecystectomy skin graft d & c  Tracheostomy  Family History: Reviewed history from 03/26/2008 and no changes required. No FH of Colon Cancer: Family History of Diabetes: Father, Paternal Grandmother Family History of Heart Disease: Mother, Father Family History of Clotting disorder: Mother  Social History: Reviewed history from 10/22/2008 and no changes required. Alcohol Use - no Illicit Drug Use - no Patient does not get regular exercise.  Patient has never smoked.   Review of Systems       The patient complains of back pain.  The patient denies allergy/sinus, anemia, anxiety-new, arthritis/joint pain, blood in urine, breast changes/lumps, change in vision, confusion, cough, coughing up blood, depression-new, fainting, fatigue, fever, headaches-new, hearing problems, heart murmur, heart rhythm changes, itching, menstrual pain, muscle pains/cramps, night sweats, nosebleeds, pregnancy symptoms, shortness of breath, skin rash, sleeping problems, sore throat, swelling of feet/legs, swollen lymph glands, thirst - excessive , urination - excessive , urination changes/pain, urine leakage,  vision changes, and voice change.         Pertinent positive and negative review of systems were noted in the above HPI. All other ROS was otherwise negative.   Vital Signs:  Patient profile:   50 year old female Height:      68 inches Weight:      442 pounds BMI:     67.45 BSA:     2.87 Pulse rate:   92 / minute Pulse rhythm:   regular BP sitting:   116 / 64  (left arm) Cuff size:   regular  Vitals Entered By: Ok Anis CMA (August 17, 2009 12:18 PM)  Physical Exam  General:  morbidly obese Eyes:  PERRLA, no icterus. Mouth:  No deformity or lesions, dentition normal. Neck:  thick neck,  Lungs:  Clear throughout to auscultation. Heart:  Regular rate and rhythm; no murmurs, rubs,  or bruits. 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 normal B.S's,  Extremities:  large obese feet and ankles Psych:  Alert and cooperative. Normal mood and affect.   Impression & Recommendations:  Problem # 1:  SPLENOMEGALY (ICD-789.2) hepatosplenomegaly by CT scan 03/2009, likely due to steatohepatitis.  Problem # 2:  DIARRHEA (ICD-787.91) multifactorial diarrhea, discussed with the pt at length. Continue all meds prescribed.  Patient Instructions: 1)  continue Questran 4gm by mouth once daily 2)  Lomotil 1 by mouth three times a day as needed 3)  Bentyl 20 mg by mouth three times a day ( could not afford Librax) 4)  OV 6 months 5)  Copy sent to : Dr Maurice Small

## 2010-07-25 NOTE — Letter (Signed)
Summary: [facility]   Imported By: Lester Langley 02/17/2010 08:13:30  _____________________________________________________________________  External Attachment:    Type:   Image     Comment:   External Document

## 2010-07-27 NOTE — Miscellaneous (Signed)
Summary: Treatment Plans/[facility]  Treatment Plans/[facility]   Imported By: Sherian Rein 07/11/2010 10:22:37  _____________________________________________________________________  External Attachment:    Type:   Image     Comment:   External Document

## 2010-08-09 ENCOUNTER — Encounter: Payer: Medicare Other | Attending: Surgery | Admitting: *Deleted

## 2010-08-09 DIAGNOSIS — Z09 Encounter for follow-up examination after completed treatment for conditions other than malignant neoplasm: Secondary | ICD-10-CM | POA: Insufficient documentation

## 2010-08-09 DIAGNOSIS — Z9884 Bariatric surgery status: Secondary | ICD-10-CM | POA: Insufficient documentation

## 2010-08-09 DIAGNOSIS — Z713 Dietary counseling and surveillance: Secondary | ICD-10-CM | POA: Insufficient documentation

## 2010-08-18 ENCOUNTER — Emergency Department (HOSPITAL_COMMUNITY)
Admission: EM | Admit: 2010-08-18 | Discharge: 2010-08-19 | Disposition: A | Discharge status: Home / Self Care | Payer: Medicare Other | Attending: Emergency Medicine | Admitting: Emergency Medicine

## 2010-08-18 DIAGNOSIS — G4730 Sleep apnea, unspecified: Secondary | ICD-10-CM | POA: Insufficient documentation

## 2010-08-18 DIAGNOSIS — E119 Type 2 diabetes mellitus without complications: Secondary | ICD-10-CM | POA: Insufficient documentation

## 2010-08-18 DIAGNOSIS — Z930 Tracheostomy status: Secondary | ICD-10-CM | POA: Insufficient documentation

## 2010-08-18 DIAGNOSIS — Z794 Long term (current) use of insulin: Secondary | ICD-10-CM | POA: Insufficient documentation

## 2010-08-18 DIAGNOSIS — I509 Heart failure, unspecified: Secondary | ICD-10-CM | POA: Insufficient documentation

## 2010-08-18 DIAGNOSIS — J45909 Unspecified asthma, uncomplicated: Secondary | ICD-10-CM | POA: Insufficient documentation

## 2010-08-18 DIAGNOSIS — Z1389 Encounter for screening for other disorder: Secondary | ICD-10-CM | POA: Insufficient documentation

## 2010-08-18 DIAGNOSIS — K219 Gastro-esophageal reflux disease without esophagitis: Secondary | ICD-10-CM | POA: Insufficient documentation

## 2010-08-18 DIAGNOSIS — Z79899 Other long term (current) drug therapy: Secondary | ICD-10-CM | POA: Insufficient documentation

## 2010-09-28 LAB — POCT I-STAT, CHEM 8
Calcium, Ion: 1.11 mmol/L — ABNORMAL LOW (ref 1.12–1.32)
Chloride: 100 mEq/L (ref 96–112)
Glucose, Bld: 103 mg/dL — ABNORMAL HIGH (ref 70–99)
HCT: 36 % (ref 36.0–46.0)
Hemoglobin: 12.2 g/dL (ref 12.0–15.0)

## 2010-09-28 LAB — URINALYSIS, ROUTINE W REFLEX MICROSCOPIC
Nitrite: NEGATIVE
Specific Gravity, Urine: 1.02 (ref 1.005–1.030)
Urobilinogen, UA: 1 mg/dL (ref 0.0–1.0)

## 2010-09-28 LAB — DIFFERENTIAL
Basophils Absolute: 0.1 10*3/uL (ref 0.0–0.1)
Basophils Relative: 1 % (ref 0–1)
Eosinophils Absolute: 0.1 10*3/uL (ref 0.0–0.7)
Monocytes Absolute: 1.1 10*3/uL — ABNORMAL HIGH (ref 0.1–1.0)
Neutro Abs: 7.8 10*3/uL — ABNORMAL HIGH (ref 1.7–7.7)
Neutrophils Relative %: 72 % (ref 43–77)

## 2010-09-28 LAB — URINE MICROSCOPIC-ADD ON

## 2010-09-28 LAB — CBC
Hemoglobin: 11.7 g/dL — ABNORMAL LOW (ref 12.0–15.0)
MCHC: 34.5 g/dL (ref 30.0–36.0)
MCV: 94 fL (ref 78.0–100.0)
RDW: 14.6 % (ref 11.5–15.5)

## 2010-09-28 LAB — PREGNANCY, URINE: Preg Test, Ur: NEGATIVE

## 2010-09-30 LAB — COMPREHENSIVE METABOLIC PANEL
ALT: 23 U/L (ref 0–35)
AST: 28 U/L (ref 0–37)
Albumin: 3.7 g/dL (ref 3.5–5.2)
CO2: 29 mEq/L (ref 19–32)
Calcium: 9 mg/dL (ref 8.4–10.5)
Chloride: 99 mEq/L (ref 96–112)
Creatinine, Ser: 0.92 mg/dL (ref 0.4–1.2)
GFR calc Af Amer: >60 mL/min (ref >60)
Sodium: 137 mEq/L (ref 135–145)
Total Bilirubin: 0.6 mg/dL (ref 0.3–1.2)

## 2010-09-30 LAB — URINALYSIS, ROUTINE W REFLEX MICROSCOPIC
Bilirubin Urine: NEGATIVE
Glucose, UA: NEGATIVE mg/dL
Hgb urine dipstick: NEGATIVE
Ketones, ur: NEGATIVE mg/dL
Protein, ur: NEGATIVE mg/dL
pH: 6.5 (ref 5.0–8.0)

## 2010-09-30 LAB — PROTIME-INR
INR: 1.5 (ref 0.00–1.49)
INR: 1.5 (ref 0.00–1.49)
INR: 1.7 — ABNORMAL HIGH (ref 0.00–1.49)
Prothrombin Time: 18.3 seconds — ABNORMAL HIGH (ref 11.6–15.2)
Prothrombin Time: 20.1 seconds — ABNORMAL HIGH (ref 11.6–15.2)

## 2010-09-30 LAB — GLUCOSE, CAPILLARY
Glucose-Capillary: 138 mg/dL — ABNORMAL HIGH (ref 70–99)
Glucose-Capillary: 149 mg/dL — ABNORMAL HIGH (ref 70–99)
Glucose-Capillary: 150 mg/dL — ABNORMAL HIGH (ref 70–99)
Glucose-Capillary: 165 mg/dL — ABNORMAL HIGH (ref 70–99)
Glucose-Capillary: 165 mg/dL — ABNORMAL HIGH (ref 70–99)
Glucose-Capillary: 169 mg/dL — ABNORMAL HIGH (ref 70–99)
Glucose-Capillary: 188 mg/dL — ABNORMAL HIGH (ref 70–99)

## 2010-09-30 LAB — CBC
HCT: 35.8 % — ABNORMAL LOW (ref 36.0–46.0)
Hemoglobin: 12.2 g/dL (ref 12.0–15.0)
Hemoglobin: 13.7 g/dL (ref 12.0–15.0)
MCHC: 34 g/dL (ref 30.0–36.0)
MCHC: 34.3 g/dL (ref 30.0–36.0)
MCV: 95.6 fL (ref 78.0–100.0)
Platelets: 139 10*3/uL — ABNORMAL LOW (ref 150–400)
Platelets: 146 10*3/uL — ABNORMAL LOW (ref 150–400)
RDW: 13.9 % (ref 11.5–15.5)
RDW: 14.1 % (ref 11.5–15.5)

## 2010-09-30 LAB — RAPID URINE DRUG SCREEN, HOSP PERFORMED
Barbiturates: POSITIVE — AB
Benzodiazepines: POSITIVE — AB
Cocaine: NOT DETECTED
Opiates: NOT DETECTED

## 2010-09-30 LAB — DIFFERENTIAL
Basophils Absolute: 0 10*3/uL (ref 0.0–0.1)
Basophils Relative: 0 % (ref 0–1)
Lymphocytes Relative: 19 % (ref 12–46)
Monocytes Absolute: 0.8 10*3/uL (ref 0.1–1.0)
Neutro Abs: 6.3 10*3/uL (ref 1.7–7.7)
Neutrophils Relative %: 71 % (ref 43–77)

## 2010-09-30 LAB — POCT CARDIAC MARKERS
CKMB, poc: <1 ng/mL — ABNORMAL LOW (ref 1.0–8.0)
Troponin i, poc: <0.05 ng/mL (ref 0.00–0.09)

## 2010-09-30 LAB — LIPID PANEL
HDL: 28 mg/dL — ABNORMAL LOW (ref >39)
LDL Cholesterol: 41 mg/dL (ref 0–99)
Total CHOL/HDL Ratio: 4.9 RATIO
Triglycerides: 343 mg/dL — ABNORMAL HIGH (ref <150)
VLDL: 69 mg/dL — ABNORMAL HIGH (ref 0–40)

## 2010-09-30 LAB — BASIC METABOLIC PANEL
BUN: 14 mg/dL (ref 6–23)
BUN: 16 mg/dL (ref 6–23)
CO2: 29 mEq/L (ref 19–32)
Creatinine, Ser: 1.19 mg/dL (ref 0.4–1.2)
GFR calc non Af Amer: 49 mL/min — ABNORMAL LOW (ref >60)
Glucose, Bld: 192 mg/dL — ABNORMAL HIGH (ref 70–99)
Potassium: 3.1 mEq/L — ABNORMAL LOW (ref 3.5–5.1)
Sodium: 140 mEq/L (ref 135–145)

## 2010-09-30 LAB — POCT PREGNANCY, URINE: Preg Test, Ur: NEGATIVE

## 2010-09-30 LAB — CARDIAC PANEL(CRET KIN+CKTOT+MB+TROPI): Troponin I: 0.02 ng/mL (ref 0.00–0.06)

## 2010-09-30 LAB — APTT: aPTT: 29 seconds (ref 24–37)

## 2010-09-30 LAB — ACETAMINOPHEN LEVEL: Acetaminophen (Tylenol), Serum: <10 ug/mL — ABNORMAL LOW (ref 10–30)

## 2010-09-30 LAB — ETHANOL: Alcohol, Ethyl (B): <5 mg/dL (ref 0–10)

## 2010-09-30 LAB — CK TOTAL AND CKMB (NOT AT ARMC): Relative Index: INVALID (ref 0.0–2.5)

## 2010-11-07 NOTE — Consult Note (Signed)
[name], [name]                 ACCOUNT NO.:  [id number]   MEDICAL RECORD NO.:  [id number]          PATIENT TYPE:  INP   LOCATION:  1231                         FACILITY:  [facility]   PHYSICIAN:  Ritta Slot, MD     DATE OF BIRTH:  17-Apr-1961   DATE OF CONSULTATION:  02/14/2009  DATE OF DISCHARGE:                                 CONSULTATION   HISTORY OF PRESENT ILLNESS:  [name] is a 50 year old female who has  been followed in the past by Dr. Oneta Rack and now Dr. Lynnea Ferrier.  She has a  morbid obesity with a BMI of 70.  She weighs over 400 pounds.  She has  severe sleep apnea and has a trache.  She has a history of depression  and recently her medications were changed as an outpatient because of  her insurance.  She presented to the emergency room on February 14, 2009  feeling suicidal.  From cardiac standpoint, there has been no new  issues.  She apparently has had a history of congestive failure in the  past, I presume this was diastolic.  She has apparently never had a  diagnostic catheterization.  The attending physician asked [name] to  evaluate her because they felt her EKG was abnormal.  She does  apparently have some new T-wave inversion in V3.  Symptomatically, the  patient is stable and from a cardiac standpoint, she denies any  shortness of breath or chest pain.   PAST MEDICAL HISTORY:  Remarkable for:  1. Intermittent palpitations.  2. She has diabetes.  3. She had tracheostomy in 2001.  4. She has controlled hypertension.  5. She reportedly has normal LV function although I am not sure how      this was determined.   CURRENT MEDICATIONS:  1. Aspirin 81 mg a day.  2. Protonix 40 mg a day.  3. Lantus q. 12.  4. Claritin 10 mg a day.  5. Mobic 7.5 b.i.d.  6. Amaryl 4 mg b.i.d.  7. Bentyl 20 mg daily.  8. Bumex 4 mg q. 12.  9. BuSpar 7.5 q. 12.  10.Cardizem 120 daily.  11.Benicar 20 mg a day.  12.Lovaza daily.   ALLERGIES:  She has multiple drug intolerances  including AUGMENTIN,  CIPRO, CARDENE, LOMOTIL, MORPHINE, NITROGLYCERIN, SULFA, ALLOPURINOL and  BACTRIM.   SOCIAL HISTORY:  She is single, never been married, has no children.  She is not a smoker.  She lives at home.   FAMILY HISTORY:  Remarkable for coronary artery disease.   PHYSICAL EXAMINATION:  VITAL SIGNS:  Blood pressure 116/64, pulse 80, O2  sat 91.  GENERAL:  The patient is a morbidly obese female in no acute distress.  She was examined while sitting in a bedside chair.  She has a trache in  place.  CHEST:  Clear to auscultation and percussion.  CARDIAC:  Diminished heart sounds, overall regular rhythm.  ABDOMEN:  Morbidly obese.  EXTREMITIES:  Chronic lower extremity edema.  NEURO:  Grossly intact.  She is awake, alert, oriented, cooperative and  moves all extremities without obvious  deficit.   LABORATORY DATA:  Sodium 140, potassium 3.1, BUN 14, creatinine 1.0.  Troponin was negative x2.  White count 8.7, hemoglobin 12.2, hematocrit  35.8, platelets 139.   IMPRESSION:  1. Severe depression with suicide attempt.  2. History of congestive failure, I presume this is diastolic with      reportedly normal left ventricular function in the past.  3. Morbid obesity, weight is over 400 pounds.  4. Tracheostomy secondary to severe sleep apnea.  5. Diabetes.  6. Treated hypertension.  7. Dyslipidemia.  8. Abnormal EKG.   PLAN:  We will review her office EKGs.  The patient will be evaluated by  Dr. Lynnea Ferrier or one of his partners once the records are on the chart.      Abelino Derrick, P.A.      Ritta Slot, MD  Electronically Signed    LKK/MEDQ  D:  02/15/2009  T:  02/16/2009  Job:  [id number]

## 2010-11-07 NOTE — Assessment & Plan Note (Signed)
[facility]   NAME:  [name], [name]                 ACCOUNT NO.:  [id number]   MEDICAL RECORD NO.:  [id number]      DATE OF BIRTH:  [birth year]   PHYSICIAN:  Theresia Majors. Tanda Henry, [name] VISIT DATE:  06/12/2007                                   OFFICE VISIT   SUBJECTIVE:  [name] is a 50 year old female referred by Dr. [name]  for evaluation of an ulceration on the left lower extremity.   IMPRESSION:  Stasis ulcer secondary to fluid retention.   PLAN:  Proceed with Unna boot protocol.   SUBJECTIVE:  The patient is a 50 year old female who was referred by Dr.  Chaney Malling for evaluation of the ulceration on her left leg.  She had  been under the care of Dr. Maurice Small for several years in  consultation with the [facility].   The current wound has been present for 3 weeks, the patient does not  recall direct trauma.  She does however report that there was a period  of extreme scaliness that resulted in itching, weeping and subsequent  ulceration.  She has not been treated with antibiotics, she has never  worn compressive hose.   PAST MEDICAL HISTORY:  Remarkable for severe pulmonary insufficiency  requiring tracheostomy for the last decade.  SHE HAS MULTIPLE DRUG  ALLERGIES INCLUDING ALLEGRA, CODEINE, AUGMENTIN, CIPRO, SULFUR AND  NITROGLYCERIN.   CURRENT MEDICATION LIST:  1. Amaryl 4 mg twice a day.  2. Pulmicort 0.25 mg, 2 mL three times daily.  3. Buspirone 5 mg one and a half tablets twice daily.  4. Cholestyramine one pack p.r.n.  5. Claritin 5 mg daily.  6. Cymbalta 60 mg daily.  7. Furosemide 80 mg daily.  8. Januvia 100 mg daily.  9. K-Dur 20 mg three times a day.  10.Albuterol 2.5 mg per 3 mL three times daily per nebulizer.  11.__________ 2.8/25/2.  12.B-12/B-6 daily.  13.Mobic 7.5 mg b.i.d.   PAST SURGERIES:  1. Cholecystectomy.  2. Esophageal dilatation.  3. Tracheostomy.  4. Pinch grafts to the distal digits.  5. She has  had an endometrial ablation in 2007.   FAMILY HISTORY:  Positive for diabetes and stroke, negative for heart  attack and cancer.   Socially she is single.  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   REVIEW OF SYSTEMS:  She is able to walk but is markedly incapacitated by  the onset of dyspnea.  She denies palpitations.  She does admit to chest  pain consistent with angina pectoris.  She has never smoked.  She has  always been obese.  She has had courses of corticosteroids in the past.  She denies bowel or bladder dysfunction.  She has no dysphasia.  Her  weight has been unchanged over the last year.  She has a history of  congestive heart failure treated with diuretics.  Remainder of the  review of systems is negative.   PHYSICAL EXAM:  She is a chronically ill female in no acute distress.  She has the facies and habitus of Cushingoid syndrome.  Blood pressure  is 138/66, respirations 22, pulse rate 87, temperature is 98.2.  HEENT:  Exam is remarkable for an indwelling  tracheostomy which appears  to be noninflamed.  There is no exophthalmos.  There are end-expiratory wheezes and no rhonchi.  HEART:  Sounds were distant.  ABDOMEN:  Soft with no discernible tenderness or masses.  There is a  prominent panniculus extending distally in an apron fashion to the mid  thigh.  There are bilateral changes of stasis with hyperpigmentation and a full-  thickness ulceration on the medial aspect of the left lower extremity  the patient is insensate to the [name].  The ulcer itself  has healthy-appearing granulation with a halo of desquamated tissue.  No  debridement was needed, the pedal pulses are 3+.  There is no evidence  of ascending infection.   DISCUSSION:  This 50 year old lady has a classic stasis ulcer  undoubtedly related to minor trauma and concurrent fluid retention and  stasis pathophysiology of her left lower extremity.  We will  initiate  and maintain an Unna boot protocol to effect closure.  It is unlikely  that she would be a candidate for the use of compression hose due to her  habitus but we will recommend compression wrap with ACE bandages once we  are sure that re-epithelialization of the ulcerated area has been  achieved.  We have explained this approach to the patient in terms that  she seems to understand.  She expresses gratitude for having been seen  in the clinic.      [name], [name]  Electronically Signed     HAN/MEDQ  D:  06/12/2007  T:  06/13/2007  Job:  [id number]   cc:   Thereasa Distance A. Chaney Malling, [name]  Gretta Arab [name], [name]

## 2010-11-07 NOTE — Discharge Summary (Signed)
[name], Henry                 ACCOUNT NO.:  [id number]   MEDICAL RECORD NO.:  [id number]          PATIENT TYPE:  INP   LOCATION:  1231                         FACILITY:  [facility]   PHYSICIAN:  Hollice Espy, M.D.DATE OF BIRTH:  [birth year]   DATE OF ADMISSION:  02/14/2009  DATE OF DISCHARGE:  02/18/2009                               DISCHARGE SUMMARY   PCP:  Dr. Maurice Small.   CONSULTANTS ON THIS CASE:  1. Dr. [name], Psychiatry.  2. Dr. Ritta Slot, Cardiology.   DISCHARGE DIAGNOSES:  1. Depressive disorder.  2. Precordial chest pain felt to be anxiety based.  3. History of coronary artery disease.  4. Morbid obesity.  5. History of obstructive sleep apnea, status post tracheostomy and      BiPAP dependent.  6. History of congestive heart failure.  7. History of supraventricular tachycardia.  8. History of diabetes mellitus.  9. Gastroesophageal reflux disease.   DISCHARGE MEDICATIONS FOR THIS PATIENT:  Are as follows:  Patient will  stop taking her Wellbutrin.   NEW MEDICATIONS:  1. Cymbalta 30 mg p.o. b.i.d.  2. Aspirin 81 mg p.o. daily.  3. Lopressor 25 mg 1/2 tab that is 12.5 mg p.o. b.i.d.   Patient will continue all of her previous medications:  1. Antifungal cream as directed externally.  2. Meloxicam 7.5 p.o. b.i.d.  3. Nystatin Cream 1 application applied to affected area b.i.d.  4. Prilosec 40 p.o. daily.  5. Cholestyramine 4 g p.o. daily as needed.  6. NovoLog insulin 14 units subcu daily with meals.  7. Loratadine 10.  8. BuSpar 7.5 b.i.d.  9. Vitamin D 50,000 units weekly.  10.Zocor 20 q.h.s.  11.Zaroxolyn 2.5 p.o. 3 times a week.  12.Keflex 500 p.o. t.i.d. for infection.  13.Uloric 40 p.o. daily.  14.Percocet 5/325 one p.o. every 4 hours p.r.n.  15.Cardizem 120.  16.Xanax 0.5 p.o. t.i.d. p.r.n.  17.K-Dur 20 mEq p.o. t.i.d.  18.The Wellbutrin is being stopped.  19.Meclizine 25 p.o. daily p.r.n.  20.Oxygen 4 L at bedtime.  21.Bentyl 40 mg p.o. daily.  22.Neurontin 300 mg p.o. t.i.d.  23.Toviaz 4 mg p.o. daily.  24.Bumetanide 4 mg p.o. b.i.d.  25.Lantus 15 units subcu every 12 hours.  26.Stool softener daily.  27.Mysoline 25 mg in the morning, 50 mg in the evening.  28.Albuterol nebulizers 3 times a day p.r.n.  29.Mucinex 600 p.o. daily p.r.n.  30.Phenergan 25 p.o. every 6 hours p.r.n.  31.Vicodin 5/500 one p.o. every 6 hours p.r.n.  32.Coumadin.  The patient will increase to 7.5 on Saturday, February 19, 2009, only and then resume back on her normal dose of alternating 5      mg and 2.5 every day.  33.Diovan 160 p.o. daily.  34.Pulmicort suspension 3 times a day as needed.  35.Tylenol Extra Strength as needed.  36.Amaryl 4 mg p.o. b.i.d.  37.Topamax 50 mg p.o. q.h.s.  38.Fish oil 1000 mg p.o. daily.   HOSPITAL COURSE:  Patient is a 50 year old white female with past  medical history of obstructive sleep apnea, CAD,  CHF, and depression who  presented with thoughts of suicidal ideation and feeling very  overwhelmed __________chest pain to the emergency room.  She was  admitted in regard to her depression.  Patient was put initially on  suicide precautions.  She was evaluated by Dr. Jeanie Sewer and felt the  patient was having a depressive disorder with acute depressive episode.  He did not feel, however, the patient was suicidal.  Her sitter was  discontinued.  Initially, the plan was for inpatient psychiatry  facility.  Dr. Jeanie Sewer also went through the patient's medications,  discontinued her Wellbutrin, and started her on Cymbalta.  By the  following day, hospital day 3, patient was actually improving.  Her mood  had  continued to improve.  Attempts at finding inpatient facility,  however, were difficult given the patient is on nightly BiPAP for her  tracheostomy mask.  Dr. Jeanie Sewer continued to follow the patient and by  February 18, 2009, she was felt to be much improved in terms of her   depression.  In terms of followup, there was also some concerns about  the patient's insurance covering her Cymbalta given her Wellbutrin did  not work for her.  Attempts are being made to get insurance approval but  in the interim patient will be provided with samples through her PCP and  neurologist.  Dr. Jeanie Sewer then titrated up the patient's Cymbalta from  20 mg to 30 mg p.o. b.i.d. given that the 20 mg free samples were not  available in the doctor's office.  In regard to her other medical  issues, these were stable.  __________ chest pain she was evaluated by  [facility].  They reviewed her EKG and there was a  question whether there were some T-wave inversions in leads V2 and V3.  After reviewing the office records, discussed with Dr. Lynnea Ferrier and added  aspirin 81 mg and Lopressor 25 p.o. b.i.d. to the patient's regimen.  The patient is to follow up with Dr. Lynnea Ferrier and if she continues to  have recurrence of the chest pain she will probably need to be  catheterized at [facility].  They felt that she was safe from a cardiac  standpoint to be discharged.  Patient was tolerating her Lopressor,  however, at times her blood pressure would drop into the low 90s and we  felt it best to decrease her Lopressor from 25 down to 12.5 p.o. b.i.d.  The patient's other medical issues were stable during this  hospitalization.  Patient's INR slightly became subtherapeutic.  Her  Coumadin was increased to 7.5 both on Friday, February 18, 2009, and  planned to be on Saturday, February 19, 2009, as well and then resume back  to a normal regimen.  The patient's overall disposition is improved.   ACTIVITY:  Will be as per outpatient PT resuming at her normal previous  level.   DISCHARGE DIET:  Will be a carbohydrate-modified, heart-healthy diet.  She is being discharged to home.      Hollice Espy, [name]  Electronically Signed     SKK/MEDQ  D:  02/18/2009  T:  02/18/2009   Job:  [id number]   cc:   [name], [name]  Fax: [phone]   Genene Churn. Love, [name]  Fax: [phone]   Ritta Slot, MD  Fax: [phone]   [name], [name]

## 2010-11-07 NOTE — Procedures (Signed)
[name], [name] NO.:  [id number]   MEDICAL RECORD NO.:  [id number]          PATIENT TYPE:  OUT   LOCATION:  SLEEP CENTER                 FACILITY:  [facility]   PHYSICIAN:  Coralyn Helling, MD        DATE OF BIRTH:  September 14, 1960   DATE OF STUDY:  05/03/2008                            NOCTURNAL POLYSOMNOGRAM   REFERRING PHYSICIAN:   FACILITY:  [facility].   REFERRING PHYSICIAN:  Coralyn Helling, MD   INDICATION:  Ms. [name] is a 50 year old female who had an overnight  polysomnogram done on February 17, 2008, which showed an apnea-hypopnea  index of 23 and oxygen saturation rate of 62%.  She has a history of  morbid obesity with obstructive sleep apnea and obesity hypoventilation  syndrome.  She has been tracheostomy dependent.  She has returned to the  Sleep Lab for a titration study with the hope of possibly changing over  to noninvasive positive pressure ventilation and discontinuing her  tracheostomy.   Height is 5 feet 8 inches.  Weight is 477 pounds.  BMI is 73.  Neck size  is 21.5 inches.   MEDICATIONS:  Buspirone, Mentax, Neurontin, Mysoline, K-Dur, oxybutynin,  omeprazole, warfarin, Cardizem, Zocor, Percocet, fish oil, Cinnamon,  multivitamin, vitamin D, furosemide, Zaroxolyn, promethazine, Mobic,  Vicodin, meclizine, vitamin B12, vitamin C, vitamin E, and Tylenol Extra  Strength.   EPWORTH SCORE:  15.   SLEEP ARCHITECTURE:  Total recording time was 390 minutes.  Total sleep  time was 298 minutes.  Sleep efficiency was 76%.  Sleep latency was 52  minutes.  REM latency was 192 minutes.  The patient was slept in the  upright position.  The study was notable for lack of slow-wave sleep.   RESPIRATORY DATA:  The average respiratory rate was 15.  The patient was  titrated from a BiPAP pressure setting of 8/4 to 20/11.  At a BiPAP  setting of 17/10 her apnea-hypopnea index was reduced to 3.9.  At this  pressure setting, she was observed in REM sleep  with significant REM  rebound, and snoring was eliminated.  Please note that the study was  done with the patient's tracheostomy in place, but capped.   OXYGEN DATA:  The baseline oxygenation was 94%.  The oxygen saturation  data was 70%.  At a BiPAP pressure setting of 17/10, the patient was  able to maintain her oxygen saturation even without the use of  supplemental oxygen.   CARDIAC DATA:  The average heart rate was 75.  The rhythm strip showed  normal sinus rhythm with sinus tachycardia.   MOVEMENT/PARASOMNIA:  The periodic limb movement index was 4.  The  patient had no resting trips.   IMPRESSION:  The patient was titrated to a BiPAP pressure setting of  17/10 with good control of her sleep-disordered breathing.  In addition,  she did not require the use of supplemental oxygen.   In addition to continued efforts with regards to weight reduction,  consideration could be given to having the patient initiate BiPAP  therapy at 17/10, and if she is able to tolerate this, further  consideration could also be given to having her tracheostomy  discontinued.      Coralyn Helling, MD  Diplomat, American Board of Sleep Medicine  Electronically Signed     VS/MEDQ  D:  05/03/2008 13:20:03  T:  05/04/2008 01:47:10  Job:  [id number]

## 2010-11-07 NOTE — Consult Note (Signed)
NAME:  [name], [name]                      ACCOUNT NO.:   MEDICAL RECORD NO.:  [id number]           PATIENT TYPE:   LOCATION:                                 FACILITY:   PHYSICIAN:  [name], [name]       DATE OF BIRTH:   DATE OF CONSULTATION:  02/15/2009  DATE OF DISCHARGE:                                 CONSULTATION   REQUESTING PHYSICIAN:  [facility]   REASON FOR CONSULTATION:  Depression.   HISTORY OF PRESENT ILLNESS:  [name] is a 50 year old female, admitted  to the [facility] on February 14, 2009, due to chest pain.   It was noted that at the time of admission that the patient had been  suffering from depression.  She had expressed suicidal ideation.   She suffers from morbid obesity.  She is BiPAP dependent and has  obstructive sleep apnea.   She has experienced over 2 weeks of depressed mood, low energy, poor  concentration, anhedonia, thoughts of hopelessness and helplessness as  well as intermittent thoughts of suicidal ideation.   She notes that a worsening of her mood occurred after a change from  Cymbalta to Zoloft.   PAST PSYCHIATRIC HISTORY:  [name] does have a history of recurrent  depression.  In addition to the above treatment, she has been treated  with buspirone 7.5 mg b.i.d., which was on her home medication list at  admission time.   She also has required alprazolam for anxiety 0.5 mg q.8 h. p.r.n.  She  has been treated with Wellbutrin SR 150 mg b.i.d.  Wellbutrin was on her  medication list at the time of admission.   In June 2002, she had Celexa 40 mg daily and buspirone 7.5 mg b.i.d.  listed on her medication list in the medical record.   On further review of the past medical record in October 2006, she was  still on buspirone 7.5 mg b.i.d. and was also on Cymbalta 60 mg b.i.d.   The patient's last Cymbalta dosing was in January of 2010.  This was due  to a drug plan change and finances.  She notes that she has been  experiencing major depression in the form of the above symptoms for  months.  She states that Cymbalta worked consistently.   She denies any history of mania.  She does acknowledge that she  attempted suicide 3 years ago with an overdose.   Regarding Zoloft, she describes akathisia occurring at first and then  the akathisia went away.  She eventually tolerated the Zoloft, but it  was stopped in April after 2 months of only 50 mg per day.  She was on  Wellbutrin at 150 mg per day until last Wednesday.  She noted partial  improvement with her anxiety and depression.  Last week, Wellbutrin was  increased to 300 mg per day and this was associated with her suicidal  ideation onset.   PAST MEDICAL HISTORY:  1. Morbid obesity.  2. Obstructive sleep apnea.  3. History of congestive heart failure.  4. Angina.  5. Chronic deep vein thrombosis.  6. History of MRSA.  7. Asthma.  8. Gastroesophageal reflux disease.  9. Diabetes mellitus.  10.Supraventricular tachycardia.   SOCIAL HISTORY:  Single.  No children.  She does not use alcohol or  illegal drugs.   FAMILY PSYCHIATRIC HISTORY:  None known.   ALLERGIES:  1. SULFA.  2. MORPHINE SULFATE.  3. CIPRO.  4. CODEINE.  5. NITROGLYCERIN.  6. ALLEGRA.  7. AUGMENTIN.  8. FLAGYL.   MEDICATIONS:  Her MAR is reviewed.  She is currently on buspirone 7.5 mg  b.i.d.   LABORATORY DATA:  Sodium 140, BUN 14, creatinine 1.00, glucose 192, and  calcium normal.  WBC 8.7, hemoglobin 12.2, and platelet count 139.  Urine drug screen was positive for barbiturate and positive for  benzodiazepines.  Urine pregnancy test negative.  Aspirin negative.  SGOT 28, SGPT 23.  Tylenol negative.   REVIEW OF SYSTEMS:  Constitutional, head, eyes, ears, nose, throat,  mouth, neurologic, psychiatric, cardiovascular, respiratory,  gastrointestinal, genitourinary, skin, musculoskeletal, hematologic,  lymphatic, endocrine, and metabolic are all unremarkable.    PHYSICAL EXAMINATION:  VITAL SIGNS:  Temperature 98.3, pulse 76,  respiratory rate 12, blood pressure 130/72, and O2 saturation on room  air 98%.  GENERAL APPEARANCE:  [name] is a middle-aged female, lying in a  supine position in her hospital bed with no abnormal involuntary  movements.  MENTAL STATUS:  [name] is alert.  Her eye contact is good.  Her  attention span is mildly decreased, concentration is mildly decreased.  Affect is constricted.  Mood is depressed.  She is oriented to all  spheres.  Her memory is intact to immediate, recent, and remote.  Her  fund of knowledge and intelligence are within normal limits.  Her speech  is soft.  There is no dysarthria.  Thought process is logical, coherent,  and goal-directed.  No looseness of association.  Thought content, no  thoughts of harming herself or others.  No delusions or hallucination.  Her insight is intact.  Her judgment is intact.   ASSESSMENT:  Axis I:  1. Mood disorder, 293.83, not otherwise specified (idiopathic and      general medical factors), depressed.  2. Major depressive disorder, recurrent and severe.  Axis II:  None.  Axis III:  See past medical history.  Axis IV:  General medical.  Axis V:  40.   With Ms. Krull's motivation for treatment, the fact that her suicidal  thoughts are intermittent, and the fact that she contracts for no harm  in the hospital, would remove the sitter.   RECOMMENDATION:  1. Would admit to an inpatient psychiatric unit once she is medically      cleared.  2. The indications, alternatives, and adverse effects of Cymbalta were      discussed with the patient for anti-depression.  She understands      and would like to restart Cymbalta.  3. Would start Cymbalta 20 mg q.a.m.      [name], [name]     JW/MEDQ  D:  12/14/2009  T:  12/15/2009  Job:  [id number]

## 2010-11-07 NOTE — H&P (Signed)
[name], WOODEN                 ACCOUNT NO.:  [id number]   MEDICAL RECORD NO.:  [id number]          PATIENT TYPE:  INP   LOCATION:  1409                         FACILITY:  [facility]   PHYSICIAN:  Theodosia Paling, MD    DATE OF BIRTH:  09-26-1960   DATE OF ADMISSION:  02/14/2009  DATE OF DISCHARGE:                              HISTORY & PHYSICAL   PRIMARY CARE PHYSICIAN:  [name], [name]   CHIEF COMPLAINT:  Worsening depression and suicidal ideation along with  some chest tightness   HISTORY OF PRESENT ILLNESS:  Ms. [name] is a very pleasant 50-year-  old fortunately lady with a history of severe morbid obesity and  obstructive sleep apnea, who is actually BiPAP-dependent and has  tracheostomy to address this same issue, who was in her usual state of  health until a few weeks back until she started to have depression,  worsening, according to her.  This has transpired since the change of  regimen has happened from Cymbalta to Zoloft.  She has been feeling  suicidal since Saturday, so she decided to present to the ER for same.  She has been experiencing intermittent chest tightness.  She has been  feeling nauseous for the last few days and according to her, for the  last 3 months, she has been having a significantly decreased appetite as  well.   REVIEW OF SYSTEMS:  As above in HPI, otherwise negative.   PAST MEDICAL HISTORY:  1. Morbid obesity.  2. Obstructive sleep apnea.  3. History of congestive heart failure.  4. Supraventricular tachycardia.  5. Diabetes mellitus, currently on Lantus.  6. GERD.  7. Asthma.  8. History of MRSA tracheobronchitis.  9. History of chronic deep vein thrombosis.  10.History of angina.  11.History of dental infection.  12.History of depression.   HOME MEDICATIONS:  1. Meloxicam 7.5 mg 1 tablet p.o. daily q.12h.  2. Nystatin 1000 units cream apply to affected extremity twice a day.  3. Omeprazole 40 mg delayed release p.o.  daily  5  Cholestyramine 4 grams p.o. daily p.r.n.  1. NovoLog insulin 14 units subcu q.12h. with meals.  2. Loratadine 10 mg p.o. daily.  3. Buspirone 5 mg 1-1/2 tablet orally q.12h.  4. Vitamin D 50,000 units capsule orally once a week.  5. Zocor 20 mg p.o. q.h.s.  6. Zaroxylan 2.5 mg 1 tablet p.o. 3 times a week.  7. Keflex 500 mg p.o. q.8h. for infection.  8. Percocet 5/225 mg 1 tablet p.o. q.4h. p.r.n.  9. Cardizem 120 mg p.o. q.a.c. once a day.  10.Alprazolam 0.5 mg 1 tablet orally every 8 hours p.r.n.  11.KCl 20 mg capsule extended release 1 tablet p.o. q.8h.  12.Wellbutrin SR 150 mg tablet extended release q.12h.  13.Meclizine 25 mg 1 tablet p.o. daily p.r.n.  14.Four liters oxygen, continuous.  15.Bentyl 20 mg p.o. q.12h.  16.Neurontin 100 mg 3 capsules orally q.8h.  17.Toviaz 4 mg extended release 1 tablet p.o. q.a.m.  18.Bumetanide 2 mg p.o. q.12h.  19.Lantus 50 units subcu q.12h.  20.Stool softener p.o. daily p.r.n.  21.Mysoline 50 mg 1/2 tablet in a.m., 1 tablet in p.m.  22.Albuterol 2.5 mg nebulizer solution q.8h. p.r.n.  23.Mucinex 600 mg extended release q.12h.  24.Phenergan 25 mg daily 6 hours p.r.n.  25.Hydrocodone/Tylenol 5/500 mg 1 tablet as needed for pain orally      every 6 hours.  26.Coumadin 5 mg half tablet and full tablet every other day.  27.Diovan 160 mg 1 tablet p.o. daily.  28.Pulmicort 0.25 mg 3 times a day as inhalation.  29.Tylenol Extra Strength 500 mg 1 tablet as needed orally.  30.Amaryl 4 mg twice a day.  31.Topamax 50 mg p.o. q.h.s.  32.Fish oil 1 gm p.o. daily.   ALLERGIES:  The patient is allergic to AUGMENTIN, CIPROFLOXACIN,  CARDENE, LOMOTIL, MORPHINE, NITROGLYCERIN, SULFA DRUGS, ALLOPURINOL,  MORPHINE SULFATE, and BACTRIM.   SOCIAL HISTORY:  The patient is single, has never been married, no kids.  She denies history of tobacco, alcohol, or IV drug abuse.   FAMILY HISTORY:  Significant for MI in the patient's mom.  Father had   pneumonia and dementia.   PHYSICAL EXAMINATION:  VITAL SIGNS:  Temperature 99, heart rate 87,  respiratory rate 20, blood pressure 159/80.  GENERAL:  No acute cardiorespiratory distress.  HEENT: EOMI intact.  No ears or nose discharge.  LUNGS: Distant breath sounds, distended sounds.  CARDIOVASCULAR:  Distant heart sounds, otherwise normal.  GI:  Obese, soft, nontender.  EXTREMITIES: The patient has evidence of stasis dermatitis and bilateral  nonpitting edema present.  PSYCH:  Oriented x3 depression present.  CNS: Speech intact, follows commands.   LAB DATA:  WBC 8.9, hemoglobin 13.7, hematocrit 40.0, platelet count  146, INR 1.5.  Sodium 137, potassium 3.4, chloride 99, bicarbonate 29,  glucose 175, BUN 13, creatinine 0.92, AST 28, ALT 23, total protein 8.3,  albumin 3.7, calcium 9.0.  Cardiac enzymes, first set negative.  Serum  Tylenol level less than 10.0.  Urine drug screen is positive for  barbiturates and benzodiazepines.  Alcoholism less than 5.  UA shows  many squamous cells and no evidence of UTI.   IMAGING PERFORMED:  Chest x-ray done on February 14, 2009 showing improved  basilar aeration, stable cardiomegaly, and tracheostomy in position.   ASSESSMENT/PLAN:  1. Depression with suicidal ideation.  The patient will receive      sitter. Will continue her home psychotropic medications.  We will      consult Dr. Jeanie Sewer in the morning.  I have left a message for      him. Continue current antidepressants.  2. Chest tightness:  Patient's chest pain is more likely mechanical,      although she does have some T-wave changes.  Will repeat the EKG in      the morning.  The patient will be placed on telemetry and enjymes      will be cycled.  Continue cardioprotective medication.  I have      started the patient on aspirin and continued Diovan and Diltiazem      for rate control.  Continued the home dose of Zaroxolyn.  I have      consulted cardiology.  I have requested  echocardiogram to look for      wall motion defect.  The patient anyway is a very poor candidate      for cardiac cath, given her morbid obesity.  3. Obstructive sleep apnea:  Continue the BiPAP support overnight.  4. History of chronic deep venous thrombosis:  Continue Coumadin,  pharmacy to dose.  5. Prophylaxis deep venous thrombosis, gastrointestinal requested.  6. Code status:  The patient is full code.   Total time spent in admission around 1 hour.      Theodosia Paling, MD  Electronically Signed     NP/MEDQ  D:  02/15/2009  T:  02/15/2009  Job:  [id number]   cc:   [name], [name]  Fax: [phone]

## 2010-11-07 NOTE — Assessment & Plan Note (Signed)
[facility]   NAME:  Bianca, Henry                 ACCOUNT NO.:  [id number]   MEDICAL RECORD NO.:  [id number]      DATE OF BIRTH:  19-May-1961   PHYSICIAN:  Theresia Majors. Tanda Rockers, [name] VISIT DATE:  07/21/2007                                   OFFICE VISIT   SUBJECTIVE:  [name] is a 50 year old lady who we have followed for  stasis ulcer involving her left lower extremity.  In the interim there  has been no drainage, malodor, pain or fever.   OBJECTIVE:  Blood pressure is 164/88, respirations 20, pulse of 80,  temperature 98.2.  Capillary blood glucose is 170 mg percent.  Inspection of the left lower extremity shows that there has been  complete re-epithelialization of the wound with persistent chronic  changes of stasis.  There is no evidence of ischemic compromise.   ASSESSMENT:  Resolved stasis ulcer.   PLAN:  We are discharging the patient to return to the care of Dr.  Maurice Small.  We have given her a prescription for 20-30 mm below the  knee open toe compression hose.  We have counseled her regarding the  utilization of compression and the management of stasis and the  prevention of ulceration.  We have given her an opportunity to ask  questions.  She seems to understand.  She indicates that she has a  caretaker that will assist her in the placement of the stocking and also  in removal of the stocking.  We have emphasized that she should not  sleep in the stocking.  She seems to understand. She expresses gratitude  for having been seen in the clinic and indicates that she will be  compliant.      Harold A. Tanda Rockers, [name]  Electronically Signed     HAN/MEDQ  D:  07/21/2007  T:  07/21/2007  Job:  [id number]   cc:   [name], [name]

## 2010-11-07 NOTE — Discharge Summary (Signed)
[name], [name]                 ACCOUNT NO.:  [id number]   MEDICAL RECORD NO.:  [id number]          PATIENT TYPE:  INP   LOCATION:  4740                         FACILITY:  [facility]   PHYSICIAN:  Barnetta Chapel, MDDATE OF BIRTH:  1960/07/05   DATE OF ADMISSION:  11/13/2006  DATE OF DISCHARGE:  11/16/2006                               DISCHARGE SUMMARY   PRIMARY CARE [name]:  [name], [name]   ADMITTING DIAGNOSES:  1. Suspect pneumonia.  2. Suspect bronchitis.  3. Suspect congestive heart failure.  4. Morbid obesity.  5. History of asthma.  6. Obstructive sleep apnea status post tracheostomy.  7. Diabetes mellitus, noninsulin requiring.  8. Obesity hypoventilation syndrome.  9. History of angina.   DISCHARGE DIAGNOSES:  1. Acute bronchitis.  2. Suspect congestive heart failure.  3. Morbid obesity.  4. OSA status post tracheostomy.  5. Diabetes mellitus.   DISCHARGE MEDICATIONS:  1. Lasix 40 mg p.o. one daily.  2. Combivent one unit q.6h and q.4h p.r.n.  3. Levaquin 750 mg p.o. once daily for only 5 days and stop.  4. Mucomyst 2 mL 20% solution to be added to the neb treatment every 6      hours for one week only.  5. Xanax 0.5 mg b.i.d. p.r.n.  6. Oxybutynin 5 mg p.o. t.i.d.  7. Tarka 2/240 one tab p.o. once daily.  8. Mysoline 25 mg p.o. once daily.  9. Topamax 50 mg p.o. once daily.  10.Vytorin 10/20 one tab p.o. once daily.  11.Coumadin 5 mg p.o. once daily.  12.Pulmicort 0.5 mg b.i.d.  13.Cymbalta 60 mg p.o. once daily.  14.Clarinex 5 mg p.o. once daily.  15.Omeprazole 40 mg p.o. once daily.  16.Januvia 100 mg p.o. once daily.  17.Mucinex 600 mg p.o. twice daily.  18.Mobic 7.5 mL p.o. b.i.d. p.r.n.  19.Percocet 5/325 mg one tab p.o. q.i.d. p.r.n.   CONSULTS:  Dr. Corinda Gubler, pulmonology,  was called at patient's request,  however, the pulmonologist could not see the patient prior to discharge.   IMAGING STUDIES:  Chest x-ray showed persistent  cardiomegaly with  suspicion of worsening pulmonary vascular congestion.   PERTINENT LABS:  Sputum culture grew nonpathogenic oropharyngeal type  flora.  HbA1c was 7.4.  Blood culture did not grow any organisms.   BRIEF HISTORY AND HOSPITAL COURSE:  The patient is a 50 year old female  with multiple medical problems.  The patient presented with history of  fever, chills, cough which was productive of greenish/yellowish sputum.  The patient was admitted as a possible case of pneumonia, bronchitis and  congestive heart failure.  The chest x-ray done on admission revealed  some pulmonary congestion.  No obvious infiltrates.  WBC on admission  was normal.  The patient was admitted to the regular medical floor.  She  was started on IV Avelox 400 mg once daily.  The patient developed  redness around the veins and the IV Avelox was eventually discontinued.  This was changed IV to Levaquin.  The patient did not have any fevers or  chills during her stay in the hospital.  She continue to complain of  cough, which was productive of yellowish sputum.  Nebs treatment was  continued.  Mucinex and Mucomyst were added to the patient's regimen.  The patient has improved significantly.  She will be discharged back  home on Levaquin 750 mg p.o. once daily for 5 days.  The patient will be  continued on Lasix 40 mg p.o. once daily.  The primary care [name]  should please confirm the need to continue the patient's Lasix.  Lytes  should be followed closely especially the patient's sodium and  potassium.   DISCHARGE PLAN:  1. Discharge patient on above meds.  2. The patient will be discharged back home.  3. Continue oxygen at night time.  4. Follow-up with the primary care [name] in one week.  5. Activity as tolerated.  6. ADA diet 1800 kcal.      Barnetta Chapel, MD  Electronically Signed     SIO/MEDQ  D:  11/16/2006  T:  11/16/2006  Job:  [id number]   cc:   [name], [name]

## 2010-11-07 NOTE — Discharge Summary (Signed)
NAMEAARTI, MANKOWSKI                 ACCOUNT NO.:  [id number]   MEDICAL RECORD NO.:  [id number]          PATIENT TYPE:  INP   LOCATION:  1403                         FACILITY:  [facility]   PHYSICIAN:  Andres Shad. Rudean Curt, MD     DATE OF BIRTH:  February 27, 1961   DATE OF ADMISSION:  11/17/2006  DATE OF DISCHARGE:  11/22/2006                               DISCHARGE SUMMARY   DISCHARGE DIAGNOSES:  1. Methicillin-resistant staphylococcus aureus tracheobronchitis.  2. Hyperglycemia.   DISCHARGE MEDICATIONS:  1. Minocycline 100 mg by mouth twice daily for 12 days.  2. Clarinex 5 mg daily.  3. Coumadin 5 mg daily.  4. Cymbalta 60 mg daily.  5. Jenuvia 100 mg daily.  6. Mobic 7.5 twice daily.  7. Mysoline 50 mg daily.  8. Prilosec 40 mg daily.  9. Oxybutynin 5 mg three times daily.  10.Percocet 5/325 as needed.  11.Phenergan 25 mg as needed.  12.Potassium chloride 20 mEq three times daily.  13.Tarka 2/240 mg daily.  14.Topamax 50 mg daily.  15.Vicodin 500 mg daily.  16.Xanax 0.5 mg as needed.  17.Zetia 10 mg daily.  18.Vytorin 10/20 mg daily.  19.Neurontin.  20.Amaryl.  21.Budesonide as needed.  22.Questran as needed.  23.Levalbuterol as needed.  24.Metanx daily.   SUMMARY OF HOSPITALIZATION:  Ms. Spicer is a 50 year old female with  multiple medical problems that are listed in her history and physical.  Briefly, she has morbid obesity associated with congestive heart  failure, hypertension, non-insulin-dependent diabetes mellitus, asthma,  gastroesophageal reflux, obstructive sleep apnea requiring tracheostomy,  and chronic DVTs.  She was admitted to the hospital on Nov 17, 2006  complaining of shortness of breath, fever, chills, and cough productive  of yellowish to greenish sputum.  She was found to have an unremarkable  chest x-ray on admission, but her white blood cell count was elevated at  15,000.  She was admitted to the hospital and begun empirically on  antibiotics with Levaquin  750 mg daily.  A sputum culture done on  admission grew MRSA which was resistant to fluoroquinolones but  sensitive to tetracyclines.  The Gram's stain and culture of her sputum  were negative before any gram-negative organisms.  Thus, at this point,  the patient was changed to intravenous doxycycline, and she subsequently  improved clinically.  At the time of discharge, she was comfortable and  her lung exam had markedly improved.  The only other issue during her  hospitalization was hyperglycemia.  The patient takes Amaryl and Jenuvia  at home.  She was given a higher dose of these than normal in the  hospital, which resulted in improved glycemic control; however, she  continues to have blood sugars that were elevated in the 160 to 180  range.   FINAL ASSESSMENT/PLAN:  1. Methicillin-resistant staphylococcus aureus tracheobronchitis:  I      am going to complete a two-week course of antibiotics with      minocycline 100 mg twice daily for 12 days.  2. Hyperglycemia:  Patient will resume her home medications but review  her diabetic medications with Dr. [name] following discharge.      Andres Shad. Rudean Curt, MD  Electronically Signed     PML/MEDQ  D:  11/22/2006  T:  11/22/2006  Job:  [id number]   cc:   [name], [name]  Fax: [phone]   Coralyn Helling, MD  [address]  [place], [place] [address]

## 2010-11-07 NOTE — Discharge Summary (Signed)
NAMELE, FERRAZ                 ACCOUNT NO.:  [id number]   MEDICAL RECORD NO.:  [id number]          PATIENT TYPE:  INP   LOCATION:  1428                         [facility]   PHYSICIAN:  Barnetta Chapel, MDDATE OF BIRTH:  Feb 26, 1961   DATE OF ADMISSION:  02/06/2007  DATE OF DISCHARGE:  02/10/2007                               DISCHARGE SUMMARY   The primary care provided is Dr. Consuella Lose C. Griffin.   DISCHARGE DIAGNOSES:  1. Acute bronchitis.  2. Likely urinary tract infection.  3. Chronic respiratory failure.  4. Acute renal failure, resolved.  5. Obstructive sleep apnea, status post tracheotomy.  6. Obesity hypoventilation syndrome.  7. Morbid obesity.   DISCHARGE MEDICATIONS:  1. KCl 20 mEq p.o. 1 daily.  2. Lasix 80 mg p.o. 1 daily.  3. Colace 400 mg p.o. b.i.d.  4. MiraLax 17 grams p.o. b.i.d. p.r.n.  5. Mavik 3 mg p.o. once daily.  6. Coumadin 5 mg p.o. once daily.  7. Cymbalta 60 mg p.o. once daily.  8. Januvia 100 mg p.o. once daily.  9. __________  mg p.o. once daily.  10.Prilosec 40 mg p.o. once daily.  11.Oxybutynin 5 mg p.o. t.i.d.  12.Percocet 5/325 mg x1 tablet p.o. q.6h. p.r.n.  13.Topamax 50 mg p.o. once daily.  14.Vicodin 5/500 one tablet p.o. once daily.  15.Xanax 0.5 mg p.o. b.i.d. as needed.  16.Vytorin 10/20 one tablet p.o. once daily.  17.Neurontin 300 mg p.o. b.i.d.  18.Amaryl 3 mg p.o. once daily.  19.Budesonide 0.25 mg twice day as needed.  20.Xopenex 1.25 mg q.6h. as needed.   CONSULTATIONS DONE:  None.   IMAGING STUDIES:  The chest x-ray did not reveal any acute infiltrates  or acute failure.   Pertinent labs on admission:  The pH was 7.38, pCO2 of 48.8 and pO2 of  57.5. Sodium was 121 on admission, and the potassium was 5.5. UA was  positive for nitrites.   BRIEF HISTORY AND HOSPITAL COURSE:  Please refer to the H&P done on  February 06, 2007.  The patient is a 50 year old female with multiple  medical problems. The patient  presented with fever, dysuria and  shortness of breath. On admission, white blood count was 15.5. However,  the chest x-ray did not reveal any acute infiltrate.   The patient was admitted to the telemetry floor. The patient was started  on IV antibiotics (IV vancomycin and IV Levaquin). The IV vancomycin was  eventually discontinued. The patient has completed a five-day course of  IV Levaquin. The fever has resolved. The shortness of breath improved  significantly. Urine culture did not grow any organisms. The patient was  continued on nebulizers, Xopenex and budesonide. Blood sugar was  controlled during the patient's stay in the hospital. The patient was  also adequately rehydrated. The sodium corrected to 135 during her stay  in the hospital. INR on the day of discharge 2.6. There will be need for  the primary care Miski Feldpausch to follow the INR accordingly.   The patient has done well and will be discharged back home today to the  care of the primary care provided. PT/OT consult was called, and the  patient will have home health for PT.   DISCHARGE PLAN:  1. Discharge patient home on above medications.  2. Follow up with the primary care physician, Dr. Maurice Small, in 1      week.  3. ADA diet, 1800 kilocalories per day.  4. Activity as per home health PT.  5. The primary care Tawonna Esquer to please monitor the patient's INR.      There will be need to check this patient's INR in the next 2 to 3      days.  6. Follow up with the pulmonologist, Dr. Craige Cotta, in about a week.      Barnetta Chapel, MD  Electronically Signed     SIO/MEDQ  D:  02/10/2007  T:  02/10/2007  Job:  [id number]   cc:   [name], [name]  Fax: [phone]   Coralyn Helling, MD  [address]  [place], [place] [address]

## 2010-11-07 NOTE — Assessment & Plan Note (Signed)
[facility]   NAME:  [name], [name]                 ACCOUNT NO.:  [id number]   MEDICAL RECORD NO.:  [id number]      DATE OF BIRTH:  [birth year]   PHYSICIAN:  Theresia Majors. Tanda Rockers, [name] VISIT DATE:  06/23/2007                                   OFFICE VISIT   SUBJECTIVE:  [name] is a 51 year old lady whom we have followed for a  left lower extremity stasis ulcer.  In the interim, we treated her with  compression wrap.  She continues to be ambulatory.  There has been no  excessive drainage, malodor, pain or fever.   OBJECTIVE:  VITAL SIGNS:  Blood pressure 160/84, respirations 20, pulse  rate 102, temperature 98.2 degrees.  Capillary blood glucose is 273 mg%.  EXTREMITIES:  Inspection of the left lower extremity shows that the  ulcer has contracted significantly.  There is no evidence of active  infection, ascending cellulitis or ischemia.   ASSESSMENT:  Clinical response of stasis ulcer to compression therapy.   PLAN:  We will resume the [facility] boot and re-evaluate the patient in one  week.      Harold A. Tanda Rockers, [name]  Electronically Signed     HAN/MEDQ  D:  06/23/2007  T:  06/23/2007  Job:  [id number]

## 2010-11-07 NOTE — H&P (Signed)
[name], [name]                 ACCOUNT NO.:  [id number]   MEDICAL RECORD NO.:  [id number]          PATIENT TYPE:  INP   LOCATION:  1428                         FACILITY:  [facility]   PHYSICIAN:  Barnetta Chapel, MDDATE OF BIRTH:  December 31, 1960   DATE OF ADMISSION:  02/06/2007  DATE OF DISCHARGE:                              HISTORY & PHYSICAL   PRIMARY CARE PHYSICIAN:  Dr. Maurice Small.   CHIEF COMPLAINT:  1. Fever.  2. Shortness of breath.  3. Dysuria.   HISTORY OF PRESENT ILLNESS:  The patient is a 50 year old female with  multiple problems.  Patient is morbidly obese and has a history of  obstructive sleep apnea status post tracheostomy.  She was admitted last  May with MRSA tracheobronchitis and was treated with IV Doxycycline and  discharged back home on minocycline.  The patient has had repeated  admissions for respiratory infections.  The patient presented with a 2-  day history of fever of 101, cough which is intermittently productive of  yellowish sputum, right-sided chest pain, worrisome coughing and  dysuria.  The patient has a significant headache but no neck pain or  neck stiffness.  No projectile vomiting.  The patient has shortness of  breath.  No GI symptoms.  No weight loss.   PAST MEDICAL HISTORY:  1. Congestive heart failure.  2. Supraventricular tachycardia.  3. Diabetes mellitus, non insulin requiring.  4. GERD.  5. Asthma.  6. Methicillin resistant Staphylococcus aureus tracheobronchitis.  7. Hyperglycemia.  8. Morbid obesity.  9. Obstructive sleep apnea status post tracheostomy.  10.Obesity hyperventilation syndrome.  11.Chronic deep venous thromboses.  12.History of angina.  13.Dental infection.   MEDICATIONS PRIOR TO ADMISSION:  1. Budesonide 0.25 mg p.r.n.  2. Cholestyramine light p.r.n.  3. Clarinex 5 mg p.o. once daily.  4. Coumadin 5 mg once daily.  5. Cymbalta 50 mg p.o. once daily.  6. Januvia 100 mg p.o. once daily.  7. Xopenex  nebulization 1.25 mg t.i.d.  8. Mentax once daily.  9. Mobic 7.5 mg t.i.d.  10.Mysoline 25 mg p.o. once daily.  11.Omeprazole 40 mg p.o. once daily.  12.Oxybutynin 5 mg t.i.d. p.r.n.  13.Percocet 5/325 mg one p.o. x4 daily p.r.n.  14.Phenergan 25 mg p.o. p.r.n.  15.Kay Ciel 20 mEq p.o. t.i.d.  16.Tarka 240 mg p.o. once daily.  17.Purinethol supplement 50 mg p.o. once daily.  18.Vicodin 500 mg p.o. once daily.  19.Amaryl.  20.Neurontin.  21.Zetia 10 mg p.o. once daily.  22.Vytorin 10/20 one p.o. once daily.  23.Etoposide 50 mg p.o. once daily.   ALLERGIES:  ALLEGRA, AUGMENTIN, CIPRO, CARDENE, LOMOTIL, MORPHINE,  NITROGLYCERIN, AND SULFA DRUGS.   SOCIAL HISTORY:  The patient is single and has never been married.  She  has no children.  She denies any history of illicit drug use, alcohol or  cigarette abuse.   FAMILY HISTORY:  Significant for MI in the patient's Mom.  Her father  had pneumonia and dementia.   REVIEW OF SYSTEMS:  Is essentially as above.   PHYSICAL EXAMINATION:  VITAL SIGNS:  The temperature is  101.6, blood  pressure of 106/53, heart rate of 108 per minute, respiratory rate of  18.  GENERAL APPEARANCE:  The patient is not in any obvious respiratory  distress.  HEENT:  There is no pallor, no jaundice, extraocular muscles are intact.  NECK:  The patient has a tracheostomy tube intact.  Neck is supple.  There is no raised JVD or lymphadenopathy.  LUNGS:  Clear to auscultation.  CARDIOVASCULAR:  S1, S2, tachycardia.  ABDOMEN:  Is very, very obese and difficult to assess, nontender. Bowel  sounds are present.  NEURO EXAM:  Nonfocal.  EXTREMITIES:  Reveal 1-2+ edema, worse on the left lower extremity.   LABORATORY DATA:  ABG reveals a pH of 7.38, PCO2 of 48.8 and PO2 of  57.5.  Sodium is 121, potassium of 5.5, chloride of 19, CO2 of 30, blood  sugar of 263, BUN of 24, creatinine of 1.41 and calcium of 9.5.  White  blood count is 15,500, hemoglobin of 13.3,  hematocrit of 40, MCV of 92.2  and platelet count of 114,000.   IMPRESSION:  1. Fever.  2. Shortness of breath.  3. Suspect urinary tract infection.  4. Acute renal failure.  5. Chronic respiratory failure.  6. Obstructive sleep apnea.  7. Obesity hyperventilation syndrome.  8. Morbid obesity.   PLAN:  Will admit patient to a telemetry floor.  Will start patient on  Intravenous antibiotics.  Will pan culture patient. Will continue the  patient's medications.  Will control the patient's blood sugar.  Will gently rehydrate the  patient while watching for signs of congestive heart failure.  Further  management will depend on her hospital course.      Barnetta Chapel, MD  Electronically Signed     SIO/MEDQ  D:  02/06/2007  T:  02/06/2007  Job:  [id number]   cc:   [name], [name]  Fax: [phone]

## 2010-11-07 NOTE — Consult Note (Signed)
NAMEVIVEKA, WILMETH                 ACCOUNT NO.:  [id number]   MEDICAL RECORD NO.:  [id number]          PATIENT TYPE:  INP   LOCATION:  1231                         FACILITY:  [facility]   PHYSICIAN:  [name], [name]  DATE OF BIRTH:  1960/06/27   DATE OF CONSULTATION:  02/18/2009  DATE OF DISCHARGE:                                 CONSULTATION   HISTORY:  Ms. Teagarden has improved mood today.  She has been able to get  on the phone and work on bills while in the hospital.  She is not having  any thoughts of harming herself or others.  She has no hallucinations or  delusions.  Her memory and orientation function are intact.   She has solid hope and interests.  Her energy is still decreased.  Her  mood is still mildly depressed.  She is not having any adverse Cymbalta  effects.   REVIEW OF SYSTEMS:  GASTROINTESTINAL:  No loose stools or other  gastrointestinal side effects.   LABORATORY DATA:  INR is 1.7.   PHYSICAL EXAMINATION:  Temperature 98.2, pulse 65, respiratory rate 16,  blood pressure 98/54.   MENTAL STATUS EXAM:  Ms. Gagliano is alert.  Her eye contact is good.  Her  attention span is normal.  Concentration is normal.  Affect is mildly  constricted at baseline, but with a broad and appropriate range.  Mood  is mildly depressed.  She is oriented to all spheres.  Memory is intact  to immediate recent and remote.  Speech is normal.  Thought process  logical, coherent and goal directed.  No looseness of associations.  Thought content, no thoughts of harming herself or others.  No delusions  or hallucinations.  She smiled appropriately to the content of the  conversation.  Her insight is intact.  Her judgment is intact.   ASSESSMENT:  293.83, mood disorder not otherwise specified (general  medical and idiopathic factors), depressed.   Major depressive disorder, recurrent in partial remission.   Ms. Guillermo is not at risk to harm herself or others.  She agrees to call  emergency  services for any thoughts of harming herself, thoughts of  harming others or distress.   She has declined going to an inpatient psychiatric program and has  improved to the point where she does not require an inpatient program.  She has made a psychiatric follow up appointment for this Tuesday,  February 22, 2009.   She has Cymbalta samples available at her primary care physician's  office.   RECOMMENDATIONS:  1. Would increase her Cymbalta to 30 mg p.o. b.i.d.  The undersigned      has asked that only 1 week supply of Cymbalta be dispensed until      her psychiatric appointment where her medication can be further      monitored based on her progress.  2. It is also recommended that in additional to psychiatric medication      management, that Ms. Lissa Hoard have psychotherapy.  Cognitive      behavioral therapy could be very effective for her for  antidepression along with her medication.  3. She will continue on buspirone 7.5 mg p.o. b.i.d. for augmenting      the Cymbalta.   DISCUSSION:  The Cymbalta was restarted when considering that she had  not responded to alternative treatment.  The patient agrees to the  Cymbalta trial and agrees to pursue administrative channels in order to  obtain the Cymbalta samples including possibly a drug company program.   The Cymbalta became too expensive when her insurance company would no  longer pay for it.  In the meantime, she has secured a source of  samples.      [name], [name]  Electronically Signed     JW/MEDQ  D:  02/18/2009  T:  02/18/2009  Job:  [id number]

## 2010-11-07 NOTE — Procedures (Signed)
NAMEYARIANA, [name]:  [id number]   MEDICAL RECORD NO.:  [id number]          PATIENT TYPE:  OUT   LOCATION:  SLEEP CENTER                 FACILITY:  [facility]   PHYSICIAN:  Coralyn Helling, MD        DATE OF BIRTH:  22-Jan-1961   DATE OF STUDY:  02/17/2008                            NOCTURNAL POLYSOMNOGRAM   REFERRING PHYSICIAN:  Coralyn Helling, MD   INDICATION FOR STUDY:  Ms. Hornaday is a 50 year old female who has a  previous history of obstructive sleep apnea.  She has been tracheostomy  dependent for several years.  She was noted to have persistent nocturnal  desaturations on overnight asymmetry.  She is referred to the sleep lab  for further evaluation of her sleep disorder breathing.   Height is 5 feet 8 inches.  Weight 477 pounds, BMI 73.  Neck size is  21.5 inches.   EPWORTH SLEEPINESS SCORE:  7   MEDICATIONS:  Lantus, __________Mentax, Neurontin, Mysoline, potassium,  oxybutynin, omeprazole, warfarin, Cardizem, Zocor, meclizine, Mucinex,  fish oil, garlic, soy, cinnamon, women's multivitamin, NovoLog, Amaryl,  Pulmicort, albuterol, Xanax, cholestyramine, Lomotil, Claritin,  Cymbalta, Topamax, furosemide, promethazine, Mavik, Percocet, Vicodin,  Parafon Forte, vitamin D, alpha lipoic acid.   SLEEP ARCHITECTURE:  Total recording time was 375 minutes.  Total sleep  time was 330 minutes.  Sleep efficiency is 87%.  Sleep latency is 15  minutes.  REM latency 107 minutes.  The study was notable for lack of  slow wave sleep and an increase in percentage of REM sleep to 31% of the  study.  The patient slept sitting in a recliner.   RESPIRATORY DATA:  The average respiratory rate was 25.  The overall  apnea/hypopnea index was 23.  The events were exclusively obstructive in  nature.  The non-REM apnea/hypopnea index was 6.  The REM apnea/hypopnea  was 55.  Of note is that the patient had the study done with her  tracheostomy capped.  Moderate snoring was noted by the  technician.   OXYGEN DATA:  The baseline oxygenation was 94%.  The oxygen saturation  nadir was 62%.  The patient spent a total of 156 minutes with an oxygen  saturation less than 90% and 112 minutes with an oxygen saturation less  than 88%.  The patient was started on 3 liters of supplemental oxygen  via nasal cannula but continued to have episodes of oxygen desaturation,  particularly during REM sleep.   CARDIAC DATA:  The average heart rate was 75, and the rhythm strip  showed normal sinus rhythm with PACs.   MOVEMENT-PARASOMNIA:  The periodic limb-movement index was 0.  The  patient had one restroom trip.   IMPRESSIONS-RECOMMENDATIONS:  This study shows evidence for moderate  obstructive sleep apnea with an overall apnea/hypopnea index of 23 and  an oxygen saturation nadir of 62%.  Of note is that the majority of her  events were REM related.  This study was done with the patient's  tracheostomy capped.  What I would recommend is that the patient could  either try BiPAP and possibly with the need of supplemental oxygen and  possibly have a trial of removing her tracheostomy.  Alternatively, she  would need to have her tracheostomy remain in place and be started on  nocturnal ventilation to further treat her sleep disorder breathing.  In  addition, the patient needs to receive aggressive counseling with  regards to diet, exercise and weight reduction.      Coralyn Helling, MD  Diplomat, American Board of Sleep Medicine  Electronically Signed     VS/MEDQ  D:  02/19/2008 09:45:19  T:  02/19/2008 11:05:53  Job:  [id number]

## 2010-11-07 NOTE — Assessment & Plan Note (Signed)
[facility]   NAME:  [name], [name]                 ACCOUNT NO.:  [id number]   MEDICAL RECORD NO.:  [id number]      DATE OF BIRTH:  May 13, 1961   PHYSICIAN:  Theresia Majors. Tanda Rockers, [name] VISIT DATE:  07/07/2007                                   OFFICE VISIT   SUBJECTIVE:  [name] is a 50 year old lady who we follow for stasis  ulcer involving her left medial lower extremity.  In the interim, the  patient has had oral surgery and has complained of pain and ecchymosis  involving the right mandible.  She continues to use saline gargles and  takes p.o. pain medication.  She reports that she is feeling better.  With regard to her wound on the left lower extremity, she discontinued  the compression wrap, less than 72 hours after its initial application.  There has been no excessive drainage, malodor, pain, or fever.   OBJECTIVE:  VITAL SIGNS:  Blood pressure is 176/85, respirations 18,  pulse rate 78, temperature is 98.  EXTREMITIES:  Inspection of the left lower extremity  shows a near  separated eschar with chronic changes of stasis.  There is no drainage,  no malodor. The extremity is warm with brisk capillary refill but it is  not feverish.   ASSESSMENT:  Improved stasis ulcer most likely related to elevation and  avoidance of dependent edema.   PLAN:  We will continue triamcinolone ointment application, 4x4, and a  fish net.  We will re-evaluate the patient in 2 weeks p.r.n.      Harold A. Tanda Rockers, [name]  Electronically Signed     HAN/MEDQ  D:  07/07/2007  T:  07/07/2007  Job:  [id number]

## 2010-11-07 NOTE — H&P (Signed)
[name], [name]                 ACCOUNT NO.:  [id number]   MEDICAL RECORD NO.:  [id number]          PATIENT TYPE:  INP   LOCATION:  1848                         FACILITY:  [facility]   PHYSICIAN:  Barnetta Chapel, MDDATE OF BIRTH:  [birth year]   DATE OF ADMISSION:  11/13/2006  DATE OF DISCHARGE:                              HISTORY & PHYSICAL   PRIMARY CARE PHYSICIAN:  Dr. Maurice Small   PRESENTING COMPLAINT:  Fever, chills and cough.   HISTORY OF PRESENTING COMPLAINT:  The patient is 50 year old female with  multiple medical problems.  The patient presented with a six day history  of fever, chills, cough which is productive of greenish-yellowish  sputum.  There is associated central chest pain.  The patient has had  Zithromax without any significant improvement.  The patient presented  today with worsening of both symptoms.  No headache, no neck pain, no  sore throat, no GI symptoms and no urinary symptoms.   PAST MEDICAL HISTORY:  Includes:  1. Congestive heart failure.  2. Supraventricular tachycardia.  3. Morbid obesity.  4. Hypertension.  5. Diabetes mellitus, non-insulin-requiring.  6. GERD.  7. Asthma.  8. Obstructive sleep apnea status post tracheostomy.  The patient is      on home oxygen at night.  9. Obesity hypoventilation syndrome.  10.Chronic deep venous thrombosis for which the patient is chronically      anticoagulated.  11.Angina.  12.History of dental infection.   MEDICATIONS:  Medications prior to admission includes:  Budesonide 0.25 mg p.o. as needed.  Cholestyramine Light as needed.  Clarinex 5 mg p.o. once daily.  Coumadin 5 mg p.o. once daily.  Cymbalta 60 mg p.o. once daily.  Januvia 100 mg p.o. once daily.  Xopenex inhaler 1.25 mg t.i.d.  Mentax once daily.  Mobic 7.5 mg p.o. twice daily.  __________  25 mg p.o. once daily.  Omeprazole 40 mg p.o. once daily.  Oxybutynin 5 mg p.o. t.i.d.  Percocet 5/325 p.r.n.  Phenergan 25 mg p.r.n.  KCL 20 mEq p.o. t.i.d.  Tarka 2/240 1 tab p.o. once daily.  Vicodin.  Xanax 0.5 mg p.r.n.  Zetia 10 mg p.o. once daily.  Vytorin 10/20 1 tab p.o. once daily.  Neurontin.  Hydrocodone/acetaminophen.   ALLERGIES:  Sulfa drugs.  On further questioning, the patient informed  me that Augmentin does not work for her.  She has been on Augmentin in  the past.  Ciprofloxacin causes GI upset.  No allergies to Cipro.   SOCIAL HISTORY:  The patient is single.  The patient has never been  married.  She has no children.  She denied use of cigarettes, alcohol or  illicit drugs.   FAMILY HISTORY:  The patient's mother died of a massive myocardial  infarction.  The patient's father had dementia and pneumonia.   REVIEW OF SYMPTOMS:  Essentially as above.   PHYSICAL EXAMINATION:  On admission the T-max is 100.6, the blood  pressure ranges from 126-156/82 mmHg.  The heart rate ranges from  __________  100 beats per minute.  The respiratory rate is 18 per  minute.  General condition:  The patient is not in any obvious  discomfort.  HEENT:  No pallor, no jaundice.  The extraocular muscles  are intact.  The neck is difficult to assess due to morbid obesity.  Lungs reveal expiratory wheezes with auscultation of the lungs.  There  is adequate air entry.  No crackles noted.  CVA:  S1, S2 with distant  heart sounds.  Abdomen is morbidly obese.  It is difficult to assess  internal organs.  The bowel sounds are present.  Neuro exam is nonfocal.  Extremities reveal 1+ edema bilaterally.  The edema seems chronic.   LABORATORY DATA:  Lab work done so far:  The white blood count is 10.6,  the hemoglobin is 13.4, hematocrit is 40.4, MCV is 19.1 and the platelet  count is 126.  Sodium is 132, potassium is 3.8, chloride is 95, CO2 is  28, BUN is 12, creatinine is 0.73 with a glucose of 199.  Chest x-ray is  interpreted as cardiac enlargement with mild vascular condition, mild  patchy bibasilar  atelectasis/infiltrate.   IMPRESSION:  1. Suspect pneumonia.  2. Suspect bronchitis.  3. Suspect congestive heart failure.  4. Morbid obesity.  5. History of asthma.  6. Obstructive sleep apnea status post tracheostomy.  7. Diabetes mellitus, non-insulin-requiring.  8. Obesity hypoventilation syndrome.  9. History of angina.   PLAN:  Will admit patient to the Telemetry Floor.  We will check a  cardiac panel as per protocol.  We will start the patient on IV  Levaquin.  We will use nebs, Combivent and Pulmicort.  We will also  start the patient on prednisone  60 mg p.o. once daily and gradually taper.  Will try IV Lasix 10 mg  daily and assess subsequently.  Will review the patient's home meds.  Will panculute the patient.  We will get an EKG and fasting lipid  profile. Further management will depend on hospital course.      Barnetta Chapel, MD  Electronically Signed     SIO/MEDQ  D:  11/13/2006  T:  11/13/2006  Job:  [id number]   cc:   [name], [name]

## 2010-11-07 NOTE — Consult Note (Signed)
NAMECHIA, [name]                 ACCOUNT NO.:  [id number]   MEDICAL RECORD NO.:  [id number]          PATIENT TYPE:  EMS   LOCATION:  ED                           FACILITY:  [facility]   PHYSICIAN:  Karol T. Lazarus Salines, [name] DATE OF BIRTH:  [birth year]   DATE OF CONSULTATION:  DATE OF DISCHARGE:  12/05/2008                                 CONSULTATION   CHIEF COMPLAINT:  Trach tube dislodgement.   HISTORY OF PRESENT ILLNESS:  A 50 year old morbidly obese white female  had a tracheostomy placed some time ago by Dr. Jearld Fenton for obstructive  sleep apnea.  Lately, this has been insufficient to control her sleep  apnea and she was placed on the ventilator.  At this point, she is using  a ventilator nocturnally, but has a cuffless #6 Shiley trach tube in  place.  The tube has dislodged several times and required emergency room  trips to replace it.  This happened yesterday night and again this past  night it was rarely replaced by respiratory therapy both times.  She  called for assistance with the understanding why her tube keeps popping  out.  She claims to keep the ties relatively snug.  She does not think  she is moving unusually.  She remains morbidly obese, but is losing some  weight.  During the daytime, she uses a Passy-Muir valve to communicate.   PHYSICAL EXAMINATION:  This is a frustrated appearing very heavy adult  white female.  She is talking well with a trach tube in place with a  Passy-Muir valve.  The Velcro straps are slightly loose.  The external  skin around the stoma looks intact.   I inserted the flexible scope into the tracheostomy tube and there was a  small crust at the tip of the tube, which we vanish to clear with saline  irrigations and suction.  Below this, the trachea down to the carina was  clear and patent.  I removed the tracheostomy tube with the telescoping  in it, so I could observe the tract as I was coming out and the trach  looks excellent.  With the tube  out, I reinserted the telescope into the  stoma and inspected the stoma and the trachea all of which looks clean  and healthy.   IMPRESSION:  Trach tube dislodgement.  Not clear whether this is coming  from poor technique of fixing the straps, from her morbid obesity, or  from the pressure from the ventilator.  She does note that there is  substantial air leak through her nose all night long with the ventilator  and a cuffless trache tube, which would be anticipated.   PLAN:  After discussion, we elected not to use a cuffed tracheostomy  tube.  We will try a #6 extra length tracheostomy to see if this is more  readily secured.  I taught her how to keep the ties properly affixed.  I  did replace the #6 Shiley XLT extra long trach proximal cuffless tube  without difficulty.  She tolerated this well.  She will call  us  in 1 week with a progress report.  She will call the home respiratory  therapist today to let them know that the tube has been changed in case  they need to make any changes to the ventilator.  I wrote a prescription  for a spare tracheostomy tube for the Home Health Team.      Gloris Manchester. Lazarus Salines, [name]  Electronically Signed     KTW/MEDQ  D:  12/05/2008  T:  12/05/2008  Job:  [id number]   cc:   [name], [name]  Fax: [phone]   Coralyn Helling, MD  [address]  [place], [place] [address]

## 2010-11-07 NOTE — H&P (Signed)
NAMEHOLLY, Bianca                 ACCOUNT NO.:  [id number]   MEDICAL RECORD NO.:  [id number]          PATIENT TYPE:  EMS   LOCATION:  ED                           FACILITY:  [facility]   PHYSICIAN:  Barnetta Chapel, MDDATE OF BIRTH:  [birth year]   DATE OF ADMISSION:  11/17/2006  DATE OF DISCHARGE:                              HISTORY & PHYSICAL   PRIMARY CARE Ashten Prats:  Dr. Consuella Lose C. Griffin.   CHIEF COMPLAINT:  Shortness of breath.   HISTORY OF PRESENTING COMPLAINT:  The patient is a 50 year old female  with multiple medical problems.  The patient was discharged from [facility] on Nov 16, 2006 (yesterday).  The patient re-presents to  the ER with the same problems.  She reports shortness of breath, fever,  chills, cough which is productive of yellowish to greenish sputum.  On  previous admission, after admission, none of these symptoms or findings  were noted during her stay in the hospital.  She reports shortness of  breath on exertion.  A chest x-ray done on this current ED encounter is  read as normal.  White count is 15,000, but this is most likely  secondary to steroids.  Documented temperature in the ER was 98.6.  The  patient is also asking to see the pulmonologist (Dr. Kriste Basque).   PAST MEDICAL HISTORY:  1. Congestive heart failure.  2. Supraventricular tachycardia.  3. Morbid obesity.  4. Hypertension.  5. Diabetes mellitus, noninsulin requiring.  6. GERD.  7. Asthma.  8. OSA status post tracheostomy.  9. Obesity hypoventilation syndrome.  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  11.Angina.  12.History of dental infection.  13.The last discharge diagnosis (yesterday) was that of acute      bronchitis.   MEDS:  1. Xanax 0.5 p.o. b.i.d. p.r.n.  2. Levaquin 750 mg p.o. once daily for the next 4 days.  3. Oxybutynin 5 mg p.o. t.i.d.  4. Tarka 2/240, one tab p.o. once daily.  5. Mysoline 25 mg p.o. once daily.  6. Topamax 50 mg p.o. once daily.  7. Vytorin 10/20, one tab p.o.  once daily.  8. Coumadin 5 mg p.o. once daily.  9. Nebs, Pulmicort 0.5 mg b.i.d.  10.Cymbalta 60 mg p.o. once daily.  11.Clarinex 5 mg p.o. once daily.  12.Omeprazole 40 mg p.o. once daily.  13.Januvia 100 mg p.o. once daily.  14.Lasix 40 mg p.o. once daily.  15.Mucinex 600 mg p.o. b.i.d.  16.Mobic 7.5 mg p.o. b.i.d. p.r.n.  17.Percocet 5/325 one tab p.o. q.6h. p.r.n.   ALLERGIES:  THE PATIENT REPORTS ALLERGIES TO SO MANY MEDICATIONS.  1. SHE IS KNOWN TO BE ALLERGIC TO SULFA DRUGS.  2. ON LAST ADMISSION, THE VEIN BECAME RED WHEN AVELOX WAS      ADMINISTERED.  THE VEIN BECAME REDDISH WHEN AVELOX WAS      ADMINISTERED.   SOCIAL HISTORY:  The patient is single.  The patient has never been  married.  She has no children.  She denied use of cigarettes, alcohol or  illicit drugs.   FAMILY HISTORY:  The patient's mother died of myocardial  infarction.  The patient's father had dementia and pneumonia.   REVIEW OF SYSTEMS:  No headache, no neck pain, no chest pain, no GI  symptoms, no urinary symptoms.   PHYSICAL EXAMINATION:  The temp is 98.3, the blood pressure is 143/60,  the heart rate is 92 with a respiratory rate of 22.  GENERAL CONDITION:  The patient appears quite stable.  HEENT:  No pallor, no jaundice, extraocular muscle movements are intact.  NECK:  Supple.  It is difficult to assess for lymph nodes or JVD.  LUNGS:  Clear to auscultation.  CVS:  S1, S2, no heart murmur appreciated.  ABDOMEN:  Morbidly obese.  It is difficult to assess the abdomen.  EXTREMITIES:  Edema 1+.  NEURO:  Nonfocal.   LABS:  So far, the chest x-ray is read as normal.  White count is 15.9,  hemoglobin is 13.3, hematocrit is 40.4, MCV is 9.4 with a platelet count  of 188,000.  An ABG reveals a pH of 7.32, pCO2 of 64.5 and pO2 of  __________, and this was done on 15L of oxygen.  Sodium is 137,  potassium is 3.6, chloride is 96, CO2 is 35, blood sugar is 241, BUN is  11 with a creatinine of 0.8  mL.   ASSESSMENT:  1. Shortness of breath (as per patient's history).  2. Obstructive sleep apnea status post the tracheotomy.  3. Chronic respiratory failure.  4. Morbid obesity.  5. Diabetes.   Will admit the patient to the telemetry floor.  The patient has  requested a pulmonology consult.  Will get an echocardiogram.  Will get  a CBC plus diff.  Will continue p.o. Levaquin.  There may be need to  assess a psychological component to patient's illness.      Barnetta Chapel, MD  Electronically Signed     SIO/MEDQ  D:  11/17/2006  T:  11/17/2006  Job:  [id number]   cc:   [name], [name]  Fax: [phone]

## 2010-11-07 NOTE — Assessment & Plan Note (Signed)
[facility]                             PULMONARY OFFICE NOTE   [name], [name]                        MRN:          [id number]  DATE:02/27/2007                            DOB:          January 26, 1961    I saw [name] today in followup for her obstructive sleep apnea with  obesity hypoventilation syndrome, morbid obesity, and tracheostomy  status.  She was discharged from the hospital about 2-3 weeks ago.  She  was treated for an acute bronchitis and urinary tract infection.  She  says that she was feeling weak after that but her strength has been  improving.  She says her breathing is reasonably stable, although she  has been having a little bit more trouble with the change in the  weather.  She is having a cough productive of clear sputum but she  denies any purulent sputum.  She also denies hemoptysis.  She is not  currently having any fevers, chest pains, or abdominal pain.  She does  complain of having difficulty staying asleep at night.   Her medication list was reviewed and is rather extensive.   PHYSICAL EXAMINATION:  VITAL SIGNS:  She is 481 pounds.  Temperature is  97.3, blood pressure is 164/88, heart rate is 99, oxygen saturation is  93% on room air.  HEENT:  There are no oral lesions.  Tracheostomy site was clean.  HEART:  S1 S2.  CHEST:  Clear to auscultation.  ABDOMEN:  Obese, soft, nontender.  EXTREMITIES:  Reveal 2 plus edema.   IMPRESSION:  1. Obstructive sleep apnea.  2. Obesity hypoventilation syndrome.  3. Morbid obesity.  4. Tracheostomy status.   She is to continue her supplemental oxygen at night.  I have again  emphasized to her the importance of diet, exercise, and weight  reduction.  Otherwise, I do not think she needs any further augmentation  on her pulmonary care at this time.   I will follow up with her in approximately 4-6 months.     Coralyn Helling, MD  Electronically Signed    VS/MedQ  DD: 02/27/2007   DT: 02/27/2007  Job #: [id number]   cc:   [name], [name]

## 2010-11-10 NOTE — Assessment & Plan Note (Signed)
[facility]                             PULMONARY OFFICE NOTE   [name], [name]                        MRN:          [id number]  DATE:09/05/2006                            DOB:          1961-03-11    This is a 50 year old female who is morbidly obese, weight today per  patient 483 pounds.  She has a chronic tracheostomy due to obstructive  apnea by clinical impression and severe obesity with obesity-  hypoventilation syndrome.  The patient states that she has been having  difficulties with right-sided neck pain for which she has been followed  by Dr. Chaney Malling and recently has been referred to Dr. Coletta Memos to  see if intervention is necessary due to radiculopathic pain.  The  patient denies any respiratory problems.  She actually has been sleeping  well with the tracheostomy in place and uses tracheostomy collar at  nighttime.  She denies any sputum production.  She has had no fevers,  chills or sweats.   CURRENT MEDICATIONS:  As noted on the intake sheet; these have been  reviewed and are accurate.   PHYSICAL EXAMINATION:  VITALS:  As noted.  Oxygen saturation was  initially checked at 91%; after the patient settled in the room, this  was 95%.  Weight per patient is 483 pounds; we are unable to weigh her  on our current equipment.  Temperature is 98 degrees, blood pressure  130/80, pulse of 83.  GENERAL:  This is a morbidly obese female who is in no acute distress.  HEENT:  Accommodation is unremarkable.  NECK:  Tracheostomy in place.  She does have some mild redness around  the tracheostomy, which is a chronic finding.  LUNGS:  Distant-sounding.  No wheezes noted.  CARDIAC:  Regular rate and rhythm.  No rubs, murmurs or gallops heard.  EXTREMITIES:  The patient has no cyanosis and no clubbing.  She does  have trace to 1+ edema, which is chronic, as well as chronic stasis  changes.  NEUROLOGIC:  Examination is grossly nonfocal.   IMPRESSION:  Obstructive sleep apnea and obesity with obesity-  hypoventilation syndrome in a patient with massive obesity.   PLAN:  1. Plan will be for her to continue tracheostomy collar at nighttime      during sleep.  2. The patient wanted for me to make comments with regards to      operative risks for potential neck surgery.  I tried to dissuade      her against neck surgery and stated that maybe she may be able to      have conservative measures first.  I do think that surgery may be      technically difficult, given her chronic tracheostomy status and      her massive obesity and potential for increased complications      postoperatively.  3. The patient is to continue her medications as they currently are.  4. Followup will be in 4-6 months' time.  At that time, she will be      followed up by Dr. Laurier Nancy  Sood, as I will be leaving the practice.     Bianca Shelter, MD  Electronically Signed    CLG/MedQ  DD: 09/05/2006  DT: 09/07/2006  Job #: [id number]

## 2010-11-10 NOTE — H&P (Signed)
[name], [name]                 ACCOUNT NO.:  [id number]   MEDICAL RECORD NO.:  [id number]          PATIENT TYPE:  AMB   LOCATION:  SDC                           FACILITY:  [facility]   PHYSICIAN:  Janine Limbo, M.D.DATE OF BIRTH:  Oct 24, 1960   DATE OF ADMISSION:  04/01/2005  DATE OF DISCHARGE:                                HISTORY & PHYSICAL   HISTORY OF PRESENT ILLNESS:  [name] is a 50 year old female, gravida 0,  who presents with a history of irregular bleeding.  The patient is morbidly  obese and her weight is approximately 441 pounds.  The patient has been  treated in the past with Depo-Provera, but she continues to have irregular  bleeding.  Because of the patient's massive obesity, we have had a great  deal of difficulty caring for her.  In 2002, the patient was taken to the  operating room, where a D&C was performed.  She was found to have  proliferative endometrium and endometrial polyp with simple hyperplasia.  No  atypia was seen.  The patient continues to have Pap smears on a regular  basis.  At this time she presents for an attempt at endometrial ablation.   PAST MEDICAL HISTORY:  The patient's past history is significant for morbid  obesity, hypertension, diabetes, congestive heart failure, chronic  musculoskeletal pain, acid reflux, asthma, SVT, obstructive sleep apnea, and  respiratory failure.  She is status post tracheostomy.  The patient also has  had a lower extremity DVT and is on chronic anticoagulation therapy.  The  patient had a cardiac workup recently which showed an unremarkable 2-D  echocardiogram.   CURRENT MEDICATIONS:  1.  Avandaryl 4/2 mg one tablet b.i.d.  2.  Ambi 600 600 mg one tablet b.i.d.  3.  Budesonide 0.25 mg/2 mL three times a day.  4.  Buspirone 5 mg 1-1/2 tablets b.i.d.  5.  Cymbalta 60 mg one tablet each day.  6.  Depo-Provera 150 mg every 12 weeks.  7.  Fish oil 1000 mg two tablets once a day.  8.  Lasix 80 mg 1-1/2 tablets  once a day.  9.  Hydrocodone 500 mg one to two tablets every four to six hours p.r.n.      pain.  10. K-Dur 20 mEq three tablets three times a day.  11. Metanx, B vitamin complex, one tablet twice a day.  12. Levalbuterol solution per nebulizer three times a day.  13. Cholestyramine one packet every other day p.r.n. diarrhea.  14. Mobic 7.5 mg one tablet b.i.d.  15. Nexium 40 mg one tablet each day.  16. Tarka 2/180 mg one p.o. daily.  17. Oxybutynin chloride 5 mg one tablet t.i.d.  18. Promethazine 25 mg one to two q.4-6h. p.r.n. nausea.  19. Warfarin 5 mg daily.  20. Xanax 0.5 mg one tablet b.i.d.  21. Zetia 10 mg one tablet daily.  22. Zaroxolyn 2.5 mg one tablet two to three times per day.  23. Topamax 50 mg one tablet per day.  24. Oxygen 2.5 L while resting and at bedtime.  DRUG ALLERGIES:  NITROGLYCERIN, CODEINE, MORPHINE, SULFA, AUGMENTIN, CIPRO,  ALLEGRA and LOMOTIL.   SURGICAL HISTORY:  The patient has had a laparoscopic cholecystectomy.  She  had a skin graft in 1998.  She had a tracheostomy as mentioned above.  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   SOCIAL HISTORY:  The patient denies cigarette use, alcohol use and  recreational drug use.   REVIEW OF SYSTEMS:  Please see history of present illness.   FAMILY HISTORY:  Noncontributory.   PHYSICAL EXAMINATION:  VITAL SIGNS:  Weight is 450 pounds.  HEENT:  Within normal limits except for patient who sometimes does seem to  have respiratory distress.  She is obviously massively obese.  CHEST:  Clear.  CARDIAC:  Regular rate and rhythm.  BREASTS:  Large but without masses.  ABDOMEN:  Massively obese, and she has a very large panniculus.  EXTREMITIES:  Stasis changes and are extremely large.  NEUROLOGIC:  Difficult to tell because to the patient's massive obesity.  PELVIC:  External genitalia is normal.  The vagina is large, and it is  difficult to do an  exam because of discomfort.  Cervix is difficult to  visualize because of the patient's massive obesity.  The uterus we cannot  outline because of the patient's massive obesity.  Adnexa:  No masses are  appreciated.  Rectovaginal exam:  No masses could be appreciated.   ASSESSMENT:  1.  Irregular bleeding.  2.  Massive obesity.  3.  Multiple medical problems.   PLAN:  1.  We will attempt to perform a NovaSure endometrial ablation on the      patient.  2.  Because of the patient's multiple medical problems, she will be admitted      to the hospital and prepared for surgery.  Her anticoagulation will be      reversed.      Janine Limbo, [name]  Electronically Signed     AVS/MEDQ  D:  03/29/2005  T:  03/30/2005  Job:  [id number]   cc:   [name], [name]  Fax: [phone]   Darlin Priestly, MD  Fax: [phone]   Marcelyn Bruins, [name] [facility]  [address]  [place]  [place] [address]   Theotis Barrio, [name]   Danice Goltz, [name] [facility]  [address] [place], [place] [address]   Fransisco Hertz, [name]  Fax: [phone]

## 2010-11-10 NOTE — Assessment & Plan Note (Signed)
[facility]                             PULMONARY OFFICE NOTE   KSENIA, KUNZ                        MRN:          [id number]  DATE:12/30/2006                            DOB:          1961-01-11    I saw Ms. [name] today in followup for her obstructive sleep apnea with  obesity hypoventilation syndrome.  She continues to be trach collar.  She says that she was recently hospitalized for an MRSA  tracheobronchitis and has since had problems feeling decreased energy.  She is going to bed at around 10-11:00 at night.  She will sleep for  about 3 hours, wake up to use the bathroom, and then get up to work  around her apartment.  She naps several times during the week up to an  hour.  She also complains of having pains in her legs.   MEDICATIONS:  1. Clarinex 5 mg daily.  2. Coumadin 5 mg daily.  3. Cymbalta 60 mg daily.  4. Januvia 100 mg daily.  5. Mobic 7.5 mg b.i.d.  6. Mysoline 50 mg daily.  7. Prilosec 40 mg daily.  8. Oxybutynin 5 mg t.i.d.  9. Percocet 5/325 p.r.n.  10.Phenergan 25 mg in the evening.  11.Potassium chloride 20 mEq t.i.d.  12.Tarka 2/240 mg daily.  13.Topamax 50 mg daily.  14.Vicodin 500 mg daily.  15.Xanax 0.5 mg p.r.n.  16.Zetia 10 mg daily.  17.Vytorin 10/20, 1 tab daily.  18.Neurontin, Amaryl, budesonide p.r.n.  19.Questran p.r.n.  20.Xopenex p.r.n.  21.Mentax daily.   PHYSICAL EXAMINATION:  VITAL SIGNS:  She is 475 pounds.  Temperature is  98.  Blood pressure is 120/84.  Heart rate is 70.  Oxygen saturation is  94% on room air.  HEENT:  There is no sinus tenderness nor lesions.  HEART:  S1, S2.  CHEST:  There are decreased breath sounds.  ABDOMEN:  Obese, soft, nontender.  EXTREMITIES:  2+ nonpitting edema.   IMPRESSION:  Obstructive sleep apnea with obesity hypoventilation  syndrome in the setting of morbid obesity.  She is to continue on trach  collar.  I have instructed her to try and consolidate her  sleep time to  1 time during the nigh and try and avoid naps.  I have also instructed  her to avoid bright light exposure during the night hours and to try and  maximize her light exposure in the morning hours to help and train her  circadian rhythms better.  I have also emphasized to her the importance  of maintaining a diet, exercise, and weight reduction program.   I will follow up with her in approximately 4-6 months.     Coralyn Helling, MD  Electronically Signed    VS/MedQ  DD: 12/30/2006  DT: 12/30/2006  Job #: [id number]   cc:   [name], [name]

## 2010-11-10 NOTE — Op Note (Signed)
[name], [name]                           ACCOUNT NO.:  [id number]   MEDICAL RECORD NO.:  [id number]                   PATIENT TYPE:  OIB   LOCATION:  5023                                 FACILITY:  [facility]   PHYSICIAN:  Gabrielle Dare. Janee Morn, [name]             DATE OF BIRTH:  26-Oct-1960   DATE OF PROCEDURE:  DATE OF DISCHARGE:  11/06/2003                                 OPERATIVE REPORT   PREOPERATIVE DIAGNOSIS:  Symptomatic cholelithiasis.   POSTOPERATIVE DIAGNOSIS:  Symptomatic cholelithiasis.   PROCEDURE:  Laparoscopic cholecystectomy.   SURGEON:  Gabrielle Dare. Janee Morn, [name]   ASSISTANT:  Angelia Mould. Derrell Lolling, [name]   ANESTHESIA:  General.   HISTORY OF PRESENT ILLNESS:  The patient is a 50 year old white female with  severe morbid obesity.  She weighs 430 pounds.  She has been having more  frequent attacks of symptomatic cholelithiasis.  She obtained medical  clearance from her medical doctor.  She also sees Dr. Danice Goltz from  pulmonary.  She presents for elective cholecystectomy today.   PROCEDURE IN DETAIL:  Informed consent was obtained.  The patient received  intravenous antibiotics.  She was brought to the operating room.  General  anesthesia was administered.  Her abdomen was prepped and draped in sterile  fashion.  A small incision was made just to the right side of the midline  about halfway between her xiphoid and her umbilicus due her body habitus.  An Optiview 11 port was then inserted into the abdomen without difficulty.  The abdomen was insufflated with carbon dioxide.  Under direct vision, an 11  mm epigastric and two, 5 mm lateral ports were placed.  The gallbladder was  grasped and retracted superomedially.  Several adhesions were found and  taken down bluntly, clearing away some inflammatory adhesions to expose the  fundus and the infundibulum.  We then changed to the 30-degree scope for  better visualization and inserted a third, lateral 5 mm port in order  to  place the liver retractor.  Once that was inserted, we had much better  visualization.  Further dissection was continued of the infundibulum cystic  duct junction.  This was readily identified.  A nice window was made between  the cystic duct, the liver and the infundibulum.  At this point, the  gallbladder was a little friable, and there was a small tear at the  infundibulum cystic duct junction, so we elected to forego the  cholangiogram.  Three clips were placed proximally on the cystic duct, one  was placed distally and it was divided.  Further dissection revealed the  cystic artery, which was clipped twice proximally, once distally and  divided.  We continued taking the gallbladder off the liver bed.  The  posterior branch of the cystic artery was identified.  This was clipped  twice proximally and divided.  The gallbladder was taken off the liver bed  with  Bovie cautery.  Several areas were cauterized to get excellent  hemostasis.  The gallbladder was placed in an Endocatch bag and removed via  the scope port trocar site.  Once this was accomplished, the gallbladder bed  was again irrigated.  A small amount of cautery was required to get  excellent hemostasis.  Once that was obtained, a one-half piece of Surgicel  was placed in the gallbladder bed.  The irrigation had been returning clear,  and the abdomen was further irrigated and the irrigant returned clear.  The  ports were removed under direct vision and the trocars were removed after  release of the pneumoperitoneum.  All incisions were copiously irrigated.  Marcaine 0.25% with epinephrine had been used at each trocar for anesthesia.  The skin of  each was closed with running, 4-0 Vicryl subcuticular stitch.  Sponge,  needle and instrument counts were correct.  Benzoin, Steri-Strips and  sterile dressings were applied.  The patient tolerated the procedure well  without apparent complications, was taken to the recovery room  in stable  condition.                                               Gabrielle Dare Janee Morn, [name]    BET/MEDQ  D:  11/05/2003  T:  11/06/2003  Job:  [id number]

## 2010-11-10 NOTE — Op Note (Signed)
[place]  Patient:    [name]                           MRN: [id number] Proc. Date: 07/07/99 Adm. Date:  [id number] Disc. Date: [id number] Attending:  Corie Chiquito CC:         [name], [name]             Margit Banda. Jearld Fenton, [name]             Danice Goltz, [name]                           Operative Report  DATE OF BIRTH:  03-Sep-1960  DISCHARGE DIAGNOSES:  1. Sleep apnea status post tracheotomy, July 07, 1999.  2. Obesity hypoventilation requiring chronic oxygen (2 L) prior to this admission.  3. Cor pulmonale/right heart failure secondary to above problems.  4. Chronic anticoagulation.     a. Indication:  Pulmonary hypertension, obesity.     b. No history of thromboses.  5. Chronic lower extremity edema and pain.  6. Question of history of phlebitis, November 2000.  Did not see clear     documentation of location.  7. History of noncompliance.  8. Morbid obesity.  9. History of recurrent urinary tract infections.  Pyelonephritis, November 2000. 10. 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 11. Hyperglycemia.     a. Glycosylated hemoglobin 6.1%.     b. Diet-controlled.     c. Thyroid stimulating hormone 1.895. 12. 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  ALLERGIES:  MORPHINE (vomiting).  DISCHARGE MEDICATIONS:  1. Coumadin 2.5 mg q.d. (changed from 3 mg q.o.d. and 1.5 mg q.o.d.).  2. Lasix 100 mg p.o. b.i.d. (increased from 80 mg p.o. b.i.d.).  3. Potassium 20 mEq, five pills daily (increased from three pills daily).  4. BuSpar 7.5 mg p.o. b.i.d. (new).  5. Theo-Dur 200 mg p.o. b.i.d. (new).  6. Albuterol nebulizer 2.5 mg q.i.d. (new).  7. Pulmicort respules (budesonide) 1 mg q.12h. (new).  8. Extra-strength Tylenol, two pills every four hours as needed for pain (maximum     of eight per day).  9. Oxygen via tracheotomy collar (28% during the day, 35% while sleeping).    * Do not take Celebrex  200 mg p.o. b.i.d. (can effect fluid retention).  CONDITION ON DISCHARGE:  Stable.  The patient received all her home equipment prior to discharge.  Her O2 saturations awake were 95% on 28% tracheotomy collar.  RECOMMENDED ACTIVITY:  As tolerated.  We will have pulmonary restorative nursing see the patient, and hope to have her in pulmonary rehabilitation in the future.  RECOMMENDED DIET:  Low salt, 1800 calorie diabetic diet.  The patient was given  some instructions prior to discharge concerning the ADA diet, and will follow up for more outpatient teaching.  WOUND CARE:  Home health nurses will come out and change the tracheotomy bandage site twice a day until further notice from Dr. Jearld Fenton.  OTHER HOME HEALTH REQUESTS:  They will be delivering oxygen, as well as the tracheotomy collar and nebulizer equipment.  Restorative pulmonary nursing. Dressing changes to the tracheotomy collar twice a day.  Suction equipment for he patient.  Also, pro time will be drawn on Friday, July 21, 1999, and the results forwarded to Dr. [name] for adjustments.  The patient has obtained a recliner to sleep in.  Orders will  for the family will be sent to the most appropriate physician (for tracheotomy issues, Dr. Jearld Fenton; for pulmonary issues, Dr. Jayme Cloud; for issues concerning the patients Coumadin and  sugars, Dr. [name]).  FOLLOW-UP:  The patient has an appointment to see Dr. Maurice Small on Tuesday, July 25, 1999, at [facility].  At this visit, she will need her pro time checked as well as a basic metabolic panel.  This will allow [name] to adjust Coumadin and potassium, if needed.  The patient is to see Dr. Danice Goltz on Wednesday, July 26, 1999, at 3 oclock.  Dr. Jayme Cloud will be evaluating Ms. Boones pulmonary status.  The patient is to contact Dr. Jearld Fenton to schedule an appointment in approximately one month, or sooner if there are problems.  CONSULTATIONS: 1. Dr. Anastasio Auerbach (myself), patient originally admitted to Dr. Jearld Fenton service. 2. Dr. Sandrea Hughs (pulmonary).  PROCEDURES:  Tracheostomy, July 07, 1999.  HOSPITAL COURSE:  PROBLEM #1 - SLEEP APNEA:  Ms. Lukes has a long history of sleep apnea with resultant pulmonary hypertension, cor pulmonale, and right-sided heart failure.  She is very obese, and suffers from obesity hypoventilation as well.  A tracheotomy was placed to relieve the patients obstructive component.  She tolerated the procedure well.  Postprocedure, there was an attempt made to have her on nighttime ventilation, but she did not tolerate this.  She was left on tracheotomy collar at night, and did desaturate into the 80% range at times.  This is improved from her previous desaturations to probable low 60s, and we have increased her tracheotomy collar oxygen at night to help with this.  At the advice of Dr. Jayme Cloud, Theo-Dur 200 mg b.i.d. was started to help increase the patients respiratory drive.  As always, we have encouraged her to lose weight, and the patient is asking about  weight loss program, which we will have Dr. [name] and Dr. Jayme Cloud follow up n. The patient will be kept on nebulizers at home until further notice from Dr. Jayme Cloud.  Tracheotomy dressing changes will be done by home health per Dr. Jearld Fenton request.  PROBLEM #2 - LOWER EXTREMITY EDEMA:  Ms. Teall did have lower extremity edema, nd her Lasix was held around the time of surgery.  We ended up increasing her Lasix to 100 mg twice a day from 80 mg twice a day.  She was also on 40 mEq of potassium  three times a day.  She will go home on a slightly lower dose, and this will likely need to be increased as an outpatient.  Dr. [name] will be checking a basic metabolic panel in follow-up.  PROBLEM #3 - ANXIETY:  Ms. Quijas demonstrated significant anxiety with the ventilator at night.  We did start her on BuSpar 7.5 mg b.i.d., and  have continued this.  PROBLEM #4 - HYPERGLYCEMIA:  Ms. Miao was noted to have elevated blood sugars n  admission.  At that time, she was on a regular diet.  We switched her to an ADA  diet, and her sugars came down to normal or near normal.  Her glycohemoglobin was 6.1%.  She was given a glucometer prior to discharge, and will be following up diabetic education.  We talked about the effects of weight loss and, as per above, she understands the importance of this.  PROBLEM #5 - ANTICOAGULATION:  The patients INR was therapeutic upon discharge.  We did decrease her dose to 2.5 mg daily, and this may need to be adjusted  as an outpatient.  Although she denies any history of blood clotting, there is a report of phlebitis from November 2000.  I did not find the old records on that but, of course, she will remain on Coumadin anyway because of her pulmonary hypertension and obesity.  PROBLEM #6 - METHICILLIN-RESISTANT STAPHYLOCOCCUS AUREUS:  Tracheal aspirate was done on nonpurulent sputum.  It grew MRSA.  However, the patient demonstrated no clinical signs of infection.  We felt this was only a colonizer, and she was placed on no long-term antibiotics.  PROBLEM #7 - OBESITY HYPOVENTILATION SYNDROME:  Please see problem #1 for details. DD:  07/19/99 TD:  07/20/99 Job: [id number]

## 2010-11-10 NOTE — Assessment & Plan Note (Signed)
[facility]                               PULMONARY OFFICE NOTE   LANAI, CONLEE                        MRN:          [id number]  DATE:02/06/2006                            DOB:          Jan 07, 1961    [name] is a morbidly obese, 50 year old female, who follows here for obesity  with obesity hypoventilation syndrome, which is her main issue, and  obstructive sleep apnea, for which she is tracheostomy-dependent.  The  patient presents today with the complaint that she has been having  difficulties with mucopurulent sputum from around her tracheostomy site.  She apparently was seen at the walk-in clinic and was treated with an  azithromycin Z-Pak, which she has just now completed.  The patient has been  shown in the past to have colonization of MRSA in her sputum that is  sensitive to doxycycline.   The patient has chronic hypercarbic respiratory failure, due to her morbid  obesity.  As noted, she is trache-dependent, due to obstructive sleep apnea,  and requires nocturnal oxygen.   The patient denies any fevers, chills or sweats.   CURRENT MEDICATIONS:  Are as noted on a sheet provided by the patient and  are numerous and include Amaryl, Amibid, Budesonide, Buspirone,  Cholestyramine, Clarinex, Cymbalta, furosemide, Januvia, K-Dur, Xopenex,  B12, B6 supplementation, Mobic, Mysoline, Neurontin, Nexium, oxybutynin,  oxycodone, oxygen at 3 L per minute at bedtime, promethazine, Tarka,  Topamax, Vicodin, Warfarin, Xanax, Zetia, Zocor.  She, in addition, takes  Tylenol Extra Strength, Tums, Mylanta, Dulcolax, Gas-X and Imodium.  In  addition, she takes numerous dietary supplements, to include calcium and  vitamin D, fish oil, garlic, __________, soy isoflavones, vitamin B12,  vitamin B50, vitamin C and vitamin E.   ALLERGIES:  Are noted to ALLEGRA, AUGMENTIN, CIPRO, CODEINE, LOMOTIL,  MORPHINE, NITROGLYCERIN AND SULFA.   PHYSICAL EXAMINATION:   VITAL SIGNS:  As noted.  Oxygen saturation was 96% on  room air.  IN GENERAL:  This is a morbidly obese female.  Her weight cannot be  recorded, due to exceeding our scales.  The patient, however, reports it as  being 473 pounds.  HEENT EXAMINATION:  Unremarkable.  NECK:  The patient has tracheostomy in place with a Passey-Muir valve in  place.  She vocalizes well.  I personally do not see any purulent secretions  around the tracheostomy site and it looks clean.  LUNGS:  Clear with rare occasional rhonchi.  CARDIAC EXAMINATION:  Regular rate and rhythm.  ABDOMEN:  Basically cannot be assessed, due to the patient's large pannus.  EXTREMITIES:  The patient has no cyanosis.  She does have chronic stasis  changes.   IMPRESSION:  1. Mild tracheitis/tracheobronchitis in a patient with known MRSA      colonization.  2. Obesity with obesity hypoventilation syndrome and chronic hypercarbic      respiratory failure secondary to same.  The patient is tracheostomy and      oxygen dependent.  3. Morbid obesity.   PLAN:  1. I have again reiterated to the patient that the issues, pulmonary-wise  for her, are related really to her morbid obesity.  She is to have      evaluation at [facility] for potential weight-loss surgery.  2. For her tracheitis, we will switch her antibiotics to doxycycline 100      mg twice daily times 7 days.  3. Followup will be in three months' time.  She is to contact us prior to      that time, should any problems arise.                                   Gailen Shelter, MD   CLG/MedQ  DD:  02/06/2006  DT:  02/06/2006  Job #:  [id number]   cc:   [name], MD

## 2010-11-10 NOTE — Discharge Summary (Signed)
[name]. [facility]  Patient:    [name], [name]                        MRN: [id number] Adm. Date:  [id number] Disc. Date: [id number] Attending:  Anastasio Henry CC:         [name]. [name], [name]             [name], [name]             [name]. [name], [name]                           Discharge Summary  DATE OF BIRTH: [birth year]  DISCHARGE DIAGNOSES:  1. Fever/cough.     a. Viral syndrome.     b. Mild hypoxia.     c. Leukocytosis.  2. Anemia (new diagnosis).     a. Hemoglobin 10.6, MCV 91.     b. Reticulocyte count 2.6%, B-12 and folate levels normal, iron studies        pending.  3. Sleep apnea.     a. Tracheostomy, July 07, 1999.     b. Methicillin-resistant Staphylococcus aureus colonized at times.  4. Obesity.  5. Hypoventilation with nocturnal hypoxia.     a. Did not tolerate mechanical ventilation at night.     b. On 5 liter oxygen via trach collar.  6. 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     a. Chronic hypoxia related to obesity, hypoventilation/sleep apnea.     b. Pulmonary hypertension.     c. 2D echocardiogram (December 2000) showing normal ejection fraction.     d. Chronic lower extremity edema.        1. Bilateral lower extremity cellulitis, hospitalized July 21, 1999.  7. Gastroesophageal reflux disease.  8. Depression/anxiety.  9. Diabetes mellitus, type 2.     a. Diet controlled.     b. Glycosylated hemoglobin April 2001 5.2%. 10. Chronic anticoagulation.     a. For obesity and pulmonary hypertension. 11. Degenerative joint disease, right foot.  DISCHARGE MEDICATIONS:  1. Lasix 80 mg and 40 mg p.o. b.i.d.  2. Zaroxolyn 2.5 mg b.i.d.  3. Potassium 20 mEq 3 pills p.o. t.i.d.  4. Coumadin 2.5 mg q.d.  5. Celebrex 200 mg p.o. q.d.  6. Reglan 10 mg q.a.c. and h.s.  7. Prevacid 30 mg q.d.  8. Humibid LA 600 mg 2 p.o. b.i.d.  9. Celexa 20 mg 2 pills q.d. (increased from 1 q.d.). 10. Magnesium 400 mg  q.d. 11. BuSpar 7.5 mg b.i.d.  DISCHARGE CONDITION: Stable.  Room air oxygen saturation 90-94% while awake.  RECOMMENDED ACTIVITY: Exercise as much as tolerated.  RECOMMENDED DIET: Low-fat, no concentrated sweets.  Limit fluid intake to 1-1/2 liters per day.  RESPIRATORY MEDICATIONS:  1. Oxygen via trach collar at 5 liters when sleeping.  2. Albuterol nebulizer 2.5 mg q.i.d.  3. Pulmicort respules q.12h.  SPECIAL INSTRUCTIONS: The patient is to contact [name] if she develops any significant bleeding or has other questions.  FOLLOW-UP:  1. Follow-up with Dr. Maurice Henry on Tuesday, October 17, 1999, at     11 a.m.  Check potassium and stool guaiac cards at that visit.  Follow     up serum anemia work-up.  2. Keep regularly planned appointments with Dr. Jayme Henry and Dr. Jearld Henry.  CONSULTATIONS: None.  PROCEDURES: Chest x-ray (October 09, 1999) showing moderate congestive heart  failure.  Tracheostomy catheter is projected in satisfactory position.  Heart is moderately enlarged.  HOSPITAL COURSE: #1 - FEVER/DRY COUGH: [name] is a 50 year old obese Caucasian woman with sleep apnea and obesity hypoventilation, who presented with a two to three day history of a dry hacking cough and progressive body aches.  On the day prior to admission she had a temperature to 102 degrees.  She had no significant increase in secretions but a mild increase in shortness of breath.  No diarrhea or nausea.  No change in leg swelling or redness.  Because of the patients mild hypoxia (84-90% on room air) it was opted to admit her for observation and IV antibiotics.  Upon discharge last admission her oxygen saturations were 97% on room air.  She also did appear to have some mild congestive heart failure on her x-ray.  I did culture blood, urine, and sputum, and began IV Levaquin.  On day two of her admission her temperature climbed to 101.8 degrees.  Blood cultures remained negative.  I stopped the  IV Levaquin.  Sputum culture grew only normal flora.  Urine culture was negative. She was discharged home off all antibiotics.  I suspect this was a viral infection.  #2 - ANEMIA: [name] had a hemoglobin of 11.8 on admission.  Her hemoglobin in January 2001 was 12.3.  The drop could be due to acute illness with transient bone marrow suppression.  I did repeat a hemoglobin prior to discharge which was 10.6.  We were not able to collect a stool guaiac here. Iron studies are pending upon discharge.  She does have a reticulocyte count of 2.6%, which is abnormally low given her anemia.  Her B-12 and RBC folate were within normal limits.  In follow-up she should be given guaiac cards and recommend repeating a hemoglobin in two to three weeks.  #3 - SLEEP APNEA/OBESITY HYPOVENTILATION: The trach is in satisfactory position and [name] is managing its care.  She remains on room air during the day and stays on the trach collar at 5 liters at night while sleeping. She is contemplating obesity surgery in the future.  #4 - CONGESTIVE HEART FAILURE: [name] did have some mild CHF on x-ray.  She did receive Lasix 120 mg IV on the day of admission.  We will resume her home dose of diuretics.  She is also on a lot of potassium.  We will recheck in follow-up.  DISCHARGE LABORATORY DATA: Sodium 137, potassium 3.6, chloride 92, bicarbonate 38, BUN 12, creatinine 0.8, glucose 147.  INR 2.5.  Hemoglobin 10.6.  Glycosylated hemoglobin 5.2%.  DD:  10/12/99 TD:  10/13/99 Job: [id number]

## 2010-11-10 NOTE — Op Note (Signed)
NAMEAMRITHA, Henry                 ACCOUNT NO.:  [id number]   MEDICAL RECORD NO.:  [id number]          PATIENT TYPE:  INP   LOCATION:  9302                          FACILITY:  [facility]   PHYSICIAN:  Janine Limbo, M.D.DATE OF BIRTH:  31-Dec-1960   DATE OF PROCEDURE:  04/03/2005  DATE OF DISCHARGE:                                 OPERATIVE REPORT   PREOPERATIVE DIAGNOSES:  1.  Menometrorrhagia.  2.  Massive obesity (weight greater than 450 pounds).   POSTOPERATIVE DIAGNOSES:  1.  Menometrorrhagia.  2.  Massive obesity (weight greater than 450 pounds).   PROCEDURE:  1.  Hysteroscopy.  2.  Dilatation and curettage.  3.  NovaSure endometrial ablation.   SURGEON:  Janine Limbo, [name]   ANESTHESIA:  Monitored anesthetic control, paracervical block using 0.5%  Marcaine with epinephrine.   DISPOSITION:  Ms. [name] is a 50 year old female, gravida 0, who presents  with the above-mentioned diagnoses. The patient has multiple medical  comorbidities including diabetes, sinus ventricular tachycardia,  hypertension, congestive heart failure, acid reflux, asthma, chronic  musculoskeletal pain, and obstructive sleep apnea with a history of  respiratory failure. She has also had a lower extremity deep venous  thrombosis. The patient understands the indications for the above procedure  and she accepts the risks of, but not limited to, anesthetic complications,  bleeding, infection, and possible damage to surrounding organs.   FINDINGS:  As best we could tell, the uterus was normal size. No adnexal  masses could be appreciated. The uterus sounded to 9 cm. Upon inspection of  endometrial cavity, no abnormal pathology was noted.   PROCEDURE:  The patient was taken to the operating room where she was  positioned on the operating room table. We were able to tape her massive  panniculus up out of the operative field using tape and sheets. The perineum  and vagina were then prepped with  multiple layers of Betadine. A Foley  catheter was placed in the bladder. The patient was sterilely draped. An  exam was performed. A paracervical block was placed using 10 cc of 0.5%  Marcaine with epinephrine. Then 10 cc of 0.5% Marcaine were injected  directly into the cervix. An endocervical curettage was obtained. The uterus  then sounded to 8.5 cm. The cervix sounded to 3.5 cm. The cervix was then  dilated. The diagnostic hysteroscope was inserted and the cavity was  carefully inspected. Pictures were taken. The hysteroscope was removed and  the cavity was then curetted until it was felt to be clean. Again no  pathology was noted. The NovaSure device was then tested per NovaSure  protocol. The device was inserted into the uterine cavity. The cavity length  was noted to be 5 cm. The cavity width was noted to be 4 cm. A test  procedure was performed and the cavity was noted to be intact. The NovaSure  apparatus was then activated and the cavity was ablated for 53 seconds. The  patient tolerated her procedure well. Hysteroscopy was again performed and  the cavity was noted to be ablated. Pictures were once  again taken. All  instruments were removed. Examination was performed and no damage was noted.  All instruments were removed. Sponge, needle, instrument counts were correct  on two occasions. Estimated blood loss was 5 cc. The estimated fluid deficit  was 125 cc, although there was fluid noted to be in the operating catch  basket. The patient was awakened from her anesthetic without difficulty. She  was taken to recovery room in stable condition. She tolerated her procedure  well. The patient will be sent back to the floor after recovery. She will be  started on her heparin and then converted to Coumadin.      Janine Limbo, [name]  Electronically Signed     AVS/MEDQ  D:  04/03/2005  T:  04/03/2005  Job:  [id number]   cc:   [name], [name]  Fax: [phone]   Darlin Priestly, MD  Fax: [phone]   Marcelyn Bruins, [name] [facility]  [address]  [place]  [place] [address]   Estelle June. Neva Seat, [name]  Fax: [phone]   Danice Goltz, [name] [facility]  [address] [place], [place] [address]   Fransisco Hertz, [name]  Fax: [phone]

## 2010-11-10 NOTE — H&P (Signed)
[place]. [facility]  Patient:    [name], [name]                        MRN: [id number] Adm. Date:  [id number] Disc. Date: [id number] Attending:  Donnetta Hutching CC:         Armanda Magic, [name]  Barbaraann Share, [name] [facility]  [name], [name]   History and Physical  ADMITTING DIAGNOSES: 1. Febrile illness. 2. Dyspnea. 3. Congestive heart failure.  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  Yesterday during her ER visit her chest x-ray was read as normal.  Her white count was 18,000 and her O2 saturation on room air was 98%.  She was diagnosed with bronchitis and prescribed doxycycline.  She began the doxycycline today.  She states that today her fever was higher.  She felt worse.  She felt weaker and unsteady on her feet.  Her cough persists.  She presented to the emergency room again.  In the emergency room today her white count is 13,000.  Her O2 saturation on room air is 93%.  Her chest x-ray is consistent with mild CHF. The patient lives alone and feels very weak and too weak to go home.  She will be admitted for dyspnea, bronchitis, or possible viral syndrome, and congestive heart failure.  PAST MEDICAL HISTORY: 1. Sleep apnea.  She had tracheostomy in July 07, 1999.  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 2. Morbid obesity.  Apparently she has been talking to a Teacher, English as a foreign language in    [place] about options. 3. Diabetes diet controlled.  Hemoglobin A1C in May 2002 was 6.2. 4. 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  She has chronic    hypoxia due to her obesity and hypoventilation syndrome.  She has pulmonary    hypertension.  A February  2002 echocardiogram through Dr. Mayford Knife as an    outpatient showed normal left ventricular function. 5. Chronic edema with occasional cellulitis. 6. Gastroesophageal reflux disease. 7. Depression and anxiety. 8. Chronic anticoagulation due to her pulmonary hypertension. 9. 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  MEDICATIONS:  1. Coumadin 3 mg Monday through Friday and 6 mg Saturday and Sunday.  2. Zaroxolyn 2.5 mg Tuesday and Friday.  3. K-Dur 20 mEq three pills t.i.d.  4. Lasix 120 mg q.d.  5. Celexa 40 mg q.d.  6. Buspar 7.5 mg b.i.d.  7. Magnesium 400 mg q.d.  8. Celebrex 200 mg q.d.  9. Prevacid 30 mg q.d. 10. Reglan 10 mg a.c. and q.h.s. 11. Altace 5 mg q.d.  ALLERGIES:  SULFA, CODEINE, CIPRO which caused a rash (Levaquin did not cause a rash), MORPHINE.  REVIEW OF SYSTEMS:  Positive for fever, cough, dyspnea, fatigue.  Review of systems is negative for GI, GU, neurologic, MSK symptoms, or chest pain.  FAMILY HISTORY:  Her father died at age 23.  He had had a history of hypertension, diabetes, and Parkinsons disease.  Her mother died at age 47 of a massive heart attack.  She had a history of hypertension.  SOCIAL HISTORY:  The patient lives alone.  She has a sister in [place].  She has home health  care via [facility].  She denies any alcohol or tobacco use.  PHYSICAL EXAMINATION  GENERAL:  The patient is alert and oriented.  Morbidly obese.  She is dyspneic and diaphoretic.  VITAL SIGNS:  Temperature 103.2, blood pressure 110/45, pulse 106, respiratory rate 22, room air O2 saturation 93%.  HEENT:  Normal tympanic membranes.  Her tracheostomy is in place.  NECK:  No bruit.  HEART:  Distant heart sounds but from what I can auscultate a regular rate and rhythm with no murmur.  LUNGS:  Clear breath sounds with no wheezes, rales, or rhonchi.  ABDOMEN:  Obese, but nontender.  EXTREMITIES:  Edema 2+ to her knees with chronic stasis  changes.  LABORATORIES:  Chest x-ray shows no infiltrate, but mild CHF.  Hemoglobin 11.6 which is baseline, white count 13.7, platelet count 145,000. Electrolytes show sodium 133, potassium 3.8, chloride 98, bicarbonate 29, BUN 10, creatinine 0.9, glucose 191.  IMPRESSION:  The patient is a 50 year old chronically ill morbidly obese white female with a two to three day history of worsening fever, chills, and dyspnea.  It is possible that this is a viral syndrome but I will cover for bacterial etiology with Zithromax.  She had that prescribed in April 2002 and did well with it.  She has a blood culture pending from yesterdays emergency room visit.  Her symptoms possibly may be more related to the congestive heart failure.  Will plan increasing her Lasix over and above her home dose to a b.i.d. dosing, provide oxygen to keep her O2 saturations appropriate.  The patient appears very tired and somewhat weakened and will need close monitoring. DD:  11/28/00 TD:  11/29/00 Job: [id number]

## 2010-11-10 NOTE — Op Note (Signed)
[name], [name]                          ACCOUNT NO.:  [id number]   MEDICAL RECORD NO.:  [id number]                   PATIENT TYPE:  AMB   LOCATION:  ENDO                                 FACILITY:  [facility]   PHYSICIAN:  Hedwig Morton. Juanda Chance, [name] [facility]            DATE OF BIRTH:  1961/05/19   DATE OF PROCEDURE:  02/18/2002  DATE OF DISCHARGE:                                 OPERATIVE REPORT   PROCEDURE:  Upper endoscopy with esophageal dilation .   INDICATIONS FOR PROCEDURE:  This 50 year old morbidly obese female has  complained of intermittent solid food dysphagia.  She had tracheostomy,  history of gastroesophageal reflux and has complained of periodic epigastric  pain, bloating, nausea, alternating diarrhea and constipation. She has been  on Reglan, Coumadin, and most recently, switched from Prevacid 30 mg a day  to Nexium 40 mg a day.  She is undergoing upper endoscopy to assess for  esophageal stricture to rule out Barrett's esophagus and to proceed with  esophageal dilatation as indicated.   ENDOSCOPE:  Olympus single channel video endoscope.   SEDATION:  Versed 7.5 mg IV and Demerol 50 mg IV.   FINDINGS:  Olympus single channel video endoscope passed under direct vision  through posterior pharynx into esophagus.  The patient was monitored by  pulse oximeter.  Her oxygen saturations were 100%. The oxygen was given  through the tracheostomy.  There was decreased gag reflex.  Proximal, mid  and distal esophageal mucosa was completely unremarkable with no evidence of  stricture.  Endoscope traversed into stomach without resistance.  There was  no hiatal hernia.  There was no evidence of esophagitis.  A small amount of  secretions were cleared from the esophagus.   Stomach:  The stomach was insufflated with air and showed normal-appearing  gastric mucosa with a few fossae of hyperemia in the prepyloric antrum which  showed not out of ordinary, pyloric outlet itself was normal.   Retroflexion  of endoscope revealed normal fundus and cardia.   Duodenum:  The duodenal bulb and descending duodenum were normal.  Guidewire  was then placed into the stomach and two Savary dilators passed over the  guidewire, one 12.8 mm passed with ease.  The second one 17 mm or 55 French  also passed without significant resistance. There was no blood on the  dilator.  The patient tolerated the procedure well.   IMPRESSION:  No evidence of esophageal stricture, status post passage of 17  mm dilator without resistance.  Suspect the patient has esophageal motility  disorder.   PLAN:  1. May start  Coumadin today.  2.     Continue Nexium 40 mg a day and antireflux measures.  3. Continue Reglan.  4. Schedule for barium swallow with cine esophagogram and barium tablet to     assess esophageal dysmotility.  Hedwig Morton. Juanda Chance, [name] [facility]    DMB/MEDQ  D:  02/18/2002  T:  02/18/2002  Job:  [id number]   cc:   [name], [name]

## 2010-11-10 NOTE — Discharge Summary (Signed)
[place]  Patient:    [name], [name] Visit Number: [id number] MRN: [id number]          Service Type: MED Location: [phone] Attending Physician:  Katy Apo. Dictated by:   Renford Dills, [name] Admit Date:  06/20/2001   CC:         [name], [name]   Discharge Summary  DISCHARGE DIAGNOSES:  1. Right middle lobe pneumonia, improved.  2. Obstructive sleep apnea status post tracheostomy January 2001.  Patient is     on home O2 5 L at night.  Also carries the diagnosis of pulmonary     hypertension.  3. Fever, resolved.  4. History of congestive heart failure, current ejection fraction unknown at     this time.  5. Hypertension, stable.  6. Arthritis.  7. Morbid obesity.  8. Recurrent lower extremity cellulitis.  9. Diabetes, diet controlled. 10. History of MRSA colonization. 11. Chronic lower extremity edema. 12. Depression/anxiety. 13. Anticoagulation therapy secondary to pulmonary hypertension.  Discharge     INR 2.6. 14. Intertriginous fungal rash.  DISCHARGE MEDICATIONS:  1. Augmentin 875 mg one q.12h. x 7 days.  2. Norvasc 5 mg one q.d.  3. Altace 5 mg one daily.  4. Humibid LA 600 mg one q.12h.  5. K-Dur 60 mg t.i.d.  6. Celexa 20 mg one daily.  7. Celebrex 200 mg.  8. Buspar 7.5 mg one q.12h.  9. Albuterol nebulizer q.4-6h. p.r.n. 10. Zaroxolyn 2.5 mg Tuesday and Friday. 11. Coumadin 3 mg Monday through Friday, 6 mg Saturday through Sunday. 12. Reglan 10 mg. 13. Lasix 120 mg daily. 14. Mag-Ox 400 mg daily.  FOLLOWUP:  Patient will have a follow-up appointment with Dr. [name] July 03, 2001 at 1:30 p.m.  Patient is asked to call her doctor if there are any problems with fever, chills, shortness of breath.  HISTORY OF PRESENT ILLNESS:  Please see admission H&P for further details.  STUDIES:  Patient had a chest x-ray which showed a right lower lobe bronchopneumonia and stable cardiomegaly.  The  patient had an INR of 2.6 at the time of discharge.  On admission patient had ABG 7.48, PCO2 43, PO2 64.  White count 13.2, hemoglobin 12.1, platelets 156,000.  BMET essentially normal.  Patient had a calcium of 7.5, ionized calcium 1.1.  Patients sputum showed rare wbcs, predominantly PMNs, no organism.  ______ reported as negative.  Blood cultures negative.  Urine culture negative.  HOSPITAL COURSE:  #1 - PNEUMONIA, RIGHT LOWER LOBE:  The patient is a 50 year old white female with multiple medical problems who was admitted to the hospital with the diagnosis of pneumonia, fever, and hypoxia.  Patient had been tried on outpatient treatment for bronchitis; however, patient failed treatment.  She had persistent fever, chills, malaise, and a nonproductive cough also associated with decreased p.o. intake.  Patient was admitted to a medicine floor bed where she was started on empiric antibiotics on azithromycin and vancomycin added because of history of MRSA colonization.  Patient was also given other supportive care, i.e. oxygen and nebulizer treatments.  Patients antibiotics were eventually changed to Zosyn for empiric anaerobic and Gram negative coverage.  During this hospitalization the patient had no complications from a pulmonary standpoint.  Twenty-four hours after admission she stated marked improvement in her symptoms, increased appetite, and ability to move around.  On June 23, 2001 patient was deemed stable for discharge. Patient will be discharged to home on  p.o. antibiotics in form of Augmentin 875 mg b.i.d.  She will be asked to continue her oxygen therapy as prescribed at home and nebulized treatments as prescribed at home.  Patient will be given a follow-up appointment to follow up with her primary care physician in approximately one week on January 9 at 1:30 p.m.  #2 - OBSTRUCTIVE SLEEP APNEA STATUS POST TRACHEOSTOMY:  There were no complications.  #3 - DIET CONTROLLED  DIABETES:  Patient had occasional fluctuations of her blood sugars during this hospitalization which required occasional coverage with sliding scale insulin.  Blood sugars ranged from 130-200.  Patient will be followed up on an outpatient basis with her primary care physician to discuss further treatment for her non-insulin-dependent diabetes.  #4 - INTERTRIGINOUS RASH:  Patient had a fungal rash in her groin area and under the pannus associated with her morbidly obese abdomen.  She was treated conservatively with local treatment, antifungal powder and cream.  There were no complications from this.  #5 - COUMADIN THERAPY:  Patient was continued on her outpatient doses of Coumadin.  Had stable PT/INR throughout this hospitalization.  Discharge INR of 2.6.  #6 - CONGESTIVE HEART FAILURE:  Most likely right heart failure secondary to pulmonary hypertension.  Patient was continued on her outpatient medications. There were no complications from this medical problem.  The patient suffers from several other medical problems which are chronic and at baseline.  There were no further interventions for the remainder of her medical problems.  The patient is being discharged to home in stable condition.  Has follow-up at home.  She has care giver at home.  Has all of her supplies at home and her prescriptions which she will require have been called in to the pharmacy. Dictated by:   Renford Dills, [name] Attending Physician:  Katy Apo DD:  06/23/01 TD:  06/23/01 Job: [id number]

## 2010-11-10 NOTE — Consult Note (Signed)
[place]. [facility]  Patient:    [name]                           MRN: [id number] Proc. Date: 07/07/99 Adm. Date:  [id number] Attending:  Corie Chiquito CC:         Margit Banda. Jearld Fenton, [name]             Gretta Arab [name], [name]             Danice Goltz, [name]                          Consultation Report  IMPRESSION:  1. Status post tracheostomy (July 07, 1999).     a. Treatment for sleep apnea/cor pulmonale.  2. Chronic respiratory failure.     a. Secondary to obesity hypoventilation, sleep apnea.     b. Cor pulmonary, right heart failure.  3. Congestive heart failure secondary to above.  4. Chronic anticoagulation.     a. Indication:  Pulmonary hypertension, obesity -- prevent deep venous        thrombosis.  5. Morbid obesity.  6. Chronic lower extremity edema and pain.  7. History of noncompliance.  8. History of phlebitis, November 2000.  9. History of pyelonephritis (November 2000). 10. Intolerance to morphine (vomiting).  RECOMMENDATIONS:  1. Resume Coumadin when stable from a surgical standpoint (in relation to bleeding     risk).  2. No need to overlap with Lovenox due to increased bleeding risk.  3. Dr. Consuella Lose C. [name] will follow INRs as an outpatient upon discharge.  4. O2 to keep saturations 89-92% (has been requiring 2 L at home).  5. Monitor pulse oximetry closely overnight, document desaturations (may be     indication to use ventilator).  6. If the patient does desaturate, consult Dr. Casimiro Needle B. Wert for     Dr. Danice Goltz.  7. A.m. ABG and BMP.  8. Resume Lasix and potassium in the morning.  DISCUSSION:  [name] is a 50 year old obese Caucasian woman with cor pulmonale secondary to sleep apnea and possibly obesity hypoventilation.  She was admitted for acute respiratory failure on June 04, 1999.  After that admission, she as encouraged to follow up with Dr. Jayme Cloud and Dr. Margit Banda. Jearld Fenton to  coordinate possible tracheostomy.  The patient is admitted today for this elective procedure. She tolerated the procedure well and is being transferred to the ICU for close monitoring postoperatively.  She is somewhat sleepy in the recovery room but is  able to nod and write some information on paper.  She is occasionally nauseated.  ALLERGIES:  Intolerance to MORPHINE (vomiting).  CURRENT MEDICATIONS:  1. Coumadin 3 mg every other day; 1.5 mg every other day.  2. Lasix 80 mg p.o. b.i.d.  3. Potassium chloride 40 mEq p.o. t.i.d.  4. Celebrex 200 mg p.o. b.i.d.  5. O2 via nasal cannula.  6. BiPAP at night (12/5-10).  SOCIAL HISTORY, FAMILY HISTORY AND REVIEW OF SYSTEMS:  Unable to obtain at this  time.  PHYSICAL EXAMINATION:  GENERAL:  The patient is somewhat lethargic in the PACU but opens her eyes to her name and is able to answer simple questions.  VITAL SIGNS:  Temperature 98.6, BP 142/78, heart rate 102, respiratory rate 18,  saturation 92% on 50% face mask.  HEENT/NECK:  The patient has a tracheostomy in place.  She is  off the ventilator at this time.  LUNGS:  Coarse breath sounds bilaterally with decreased air movements secondary to size and chest wall thickness.  HEART:  Palpated as regular.  Tones are very distant.  No audible murmurs, rubs or gallops.  ABDOMEN:  She is very obese.  Bowel sounds are present and the belly is nontender. She does not have significant amounts of palpable edema or pitting in her abdomen.  EXTREMITIES:  These are also obese with chronic edema which is nonpitting and changes of venous stasis.  Pulses are palpated at 1+ bilaterally.  She does not  appear cyanotic.  NEUROLOGIC:  The patient opens her eyes to voice and is certainly able to follow commands.  She moves all her extremities.  The exam is nonfocal.  With the tracheostomy in, of course she is unable to vocalize.  LABORATORY FINDINGS:  Labs on July 05, 1999:  INR  1.3, PTT 28, sodium 138, potassium 3.7, chloride 98, bicarb 34, BUN 3, creatinine 0.7, glucose 128. Hemoglobin 13.7, WBC 9500, platelet count 184,000.DD:  07/07/99 TD:  07/07/99 Job: [id number]

## 2010-11-10 NOTE — Discharge Summary (Signed)
[place]. [facility]  Patient:    [name], [name]                        MRN: [id number] Adm. Date:  [id number] Disc. Date: [id number] Attending:  Anastasio Auerbach CC:         [name], [name]             Margit Banda. Jearld Fenton, [name]             Danice Goltz, [name]                           Discharge Summary  DATE OF BIRTH:  Feb 18, 1961  DISCHARGE DIAGNOSES:  1. Bilateral lower extremity cellulitis.  2. Sleep apnea with tracheostomy, July 07, 1999.  3. Obesity hypoventilation syndrome with nocturnal hypoxia.  4. Cor pulmonal, secondary to above.  5. Right-sided heart failure, secondary to above with a 2-D echo     (June 01, 1999); Normal ejection fraction.  6. Chronic lower extremity edema secondary to above.  7. Morbid obesity.  8. Hypokalemia, secondary to diuresis.  9. ? History of phlebitis, superficial. 10. Chronic anticoagulation, secondary to obesity and pulmonary hypertension. 11. Chronic right lower extremity foot pain; degenerative joint disease. 12. Gastroesophageal reflux disease. 13. Depression. 14. Diabetes mellitus, type 2.     a. Diet controlled.     b. The ______ was 6.1% on July 08, 1999. 15. Generalized deconditioning, secondary to recent hospitalizations.  Home     physical therapy planned. 16. 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 Colonizer and not pathogen, January, 2001.     b. Repeat cultures this admission (no secretions to culture). 17. Chronic anxiety.  DISCHARGE MEDICATIONS:  1. Augmentin 875 mg p.o. b.i.d. through August 04, 1999.  2. Lasix 40 mg 3 pills p.o. q. 8 a.m. and q. 2 p.m.  3. Zaroxolyn 2.5 mg on Tuesdays and Fridays.  4. Potassium chloride 20 mEq 3 pills t.i.d.  5. Coumadin 2.5 mg q.d.  6. Celebrex 200 mg q.d.  7. Reglan 10 mg 30 minutes before meals and at bedtime (new).  8. Prevacid 30 mg q.d. (new).  9. Celexa 20 mg q.d. (new), started July 26, 1999. 10. Magnesium oxide 400 mg q.d. (new). 11. Albuterol nebulizer 2.5 mg q.i.d. 12. Theo-Dur 300 mg q.12h. (increased from 200 q.12h.). 13. BuSpar 7.5 mg b.i.d. 14. Pulmicort ______ (budesonide) 1 mg q.12h. 15. Tylenol 500 mg 1-2 q.4h. p.r.n. breakthrough pain.  CONDITION ON DISCHARGE:  Stable.  OXYGEN SATURATIONS:  97% on room air with rest and 88% with ambulation.  RECOMMENDED ACTIVITY:  The patient is to walk with a walker.  Home physical therapy will evaluate and treat her as needed.  RECOMMENDED DIET:  A diabetic diet.  Low-salt.  Fluid restricted to 1400 cc/day.  SPECIAL INSTRUCTIONS:  1. The trachea collar should be on room air during the day and 50% oxygen at night     when sleeping.  2. Home health nurse visits twice a day for tracheostomy care (as previously     ordered by Dr. Jearld Fenton).  3. Home health aide with a goal of four hours per day.  4. The plan is to establish a lifeline as well.  FOLLOWUP:  The patient is to contact Dr. Constance Goltz office at [phone] to schedule n appointment to be seen in approximately  two weeks.  She is also to call Dr. Jayme Cloud at [phone] to schedule an appointment in 1-2 weeks.  A home health nurse will draw a basic metabolic panel and a prothrombin time on Monday, August 07, 1999, and report the results to Dr. [name], who will make adjustments as needed.  Dr. [name] should see the patient in her office in approximately 4-5 weeks and monitor blood work as needed via home health prior to that.  CONSULTATION:  Danice Goltz, [name]  PROCEDURE:  Lower extremity Dopplers (July 21, 1999); Technically difficult ue to obesity.  No evidence of thrombosis or thrombophlebitis in the greater saphenous, superficial femoral, popliteal, and posterior tibial veins.  HOSPITAL COURSE: #1 - BILATERAL LOWER EXTREMITY CELLULITIS:  This 50 year old, morbidly obese woman was discharged from the hospital on July 18, 1999 after a two week  stay, status post tracheostomy for obstructive sleep apnea.  In the early morning hours following her discharge, she developed pain in her left calf.  This was followed by redness, swelling, fevers, and chills.  She presented to the emergency department and was admitted on July 21, 1999 for treatment for cellulitis.  We did do lower extremity Dopplers, and there was no evidence of phlebitis or DVT.  This would ave been unlikely given the fact that we did anticoagulate her immediately postoperatively with Coumadin.  Because of her recent hospitalization, as well s the colonization of the tracheostomy secretions with MRSA, we did opt to put her on Zosyn and vancomycin.  Blood cultures were done.  She defervesced, and the inflammation slowly improved.  Cultures remained negative.  We switched her to Augmentin orally, and she tolerated this.  She remained afebrile and had marked  improvement in her lower extremity redness and swelling.  She will complete a total of 14 days of antibiotic therapy.  #2 - OBSTRUCTIVE SLEEP APNEA:  This patient had a tracheostomy done on July 07, 1999 to help treat her obstructive sleep apnea.  Dr. Jearld Fenton performed this procedure.  Postoperatively, she was tried on the ventilator because she had a component of obesity hypoventilation.  She did not tolerate this.  Upon discharge this hospitalization, she will continue to have nursing services twice a day as per Dr. Constance Goltz original instructions.  She will see him in approximately two weeks.  She did attempt to plug off the tracheostomy here, but had some difficulty with  that.  At this point, she is wearing a room air tracheostomy collar during the ay and 50% at nights as I will discuss below.  #3 - OBESITY HYPOVENTILATION:  Despite the correction of the sleep apnea with the tracheostomy, Ms. Erdmann still suffers from obesity hypoventilation.  This is demonstrated by oxygen desaturations at  night.  As a result, Dr. Jayme Cloud was  consulted.  She recommended placing a tracheostomy collar at 50% at night. This maintained the patients saturations above 85%, and she tolerated it without difficulty.  This is the setting that she will go home with, and she will follow up with Dr. Jayme Cloud.  #4 - RIGHT HEART FAILURE AND CHRONIC LOWER EXTREMITY EDEMA:  Because of chronic  hypoxia related to the above two problems, this patient has developed pulmonary  hypertension and resultant right heart failure.  She suffers from severe lower extremity edema and is on large doses of diuretics.  Her discharge weight this admission was 434.4 pounds.  We will continue to use Lasix and Zaroxolyn.  Her bicarbonate was only 32, and this was reasonable.  Given the massive amounts of  diuretics, as well as the electrolyte supplementations, and the addition of Celebrex for arthritis pain, we will need to follow her metabolic panel closely. She will have it repeated on Monday and followed by Dr. [name].  Treating the hypoxia will hopefully prevent any worsening of the pulmonary hypertension.  Of  course, weight loss is the ultimate goal.  In this situation, both of the respiratory problems are attributable to morbid obesity.  #5 - DIABETES MELLITUS, TYPE 2:  Ms. Stormer had elevate fasting sugars in the hospital.  A ______ was 6.1%.  However, we did place her on an ADA restricted diet.  Her sugars have been stable, ranging from 100-120 fasting on this diet. She has a glucometer at home to follow these.  #6 - GASTROESOPHAGEAL REFLUX DISEASE/NAUSEA:  Ms. Borland suffered from some transient nausea and symptoms of reflux.  She was placed on Reglan and Prevacid, and her symptoms improved.  She will be maintained on these medications.  #7 - ANTICOAGULATION:  Ms. Cardy has no history of DVT, although there is some questionable history of phlebitis in November, 2000.  She is on Coumadin primarily because  of her obesity and risk for DVT, as well as because of her concurrent pulmonary hypertension.  Her INR has been stable here on 2.5 mg of Coumadin daily. This will be rechecked on Monday.  #8 - DEPRESSION/ANXIETY:  We started Ms. Supak on Twodot during her last admission because of the anxiety associated with the tracheostomy.  This admission was particularly difficulty for her.  She went through a time when she was considering nursing home placement.  Her father passed away this admission.  We did place her on Celexa 20 mg daily on July 26, 1999.  This can be titrated up as needed.  #9 - HYPOKALEMIA:  Ms. Blakley has had significant hypokalemia related to diuretic use.  She will go home on an increased dose of potassium chloride (60 mEq t.i.d.) and will have her potassium checked on August 07, 1999.  #10 - MORBID OBESITY:  Ms. Ma has had a weight problem ever since puberty. he has pulmonary complications as a result and chronic heart failure and edema. She was deconditioned due to her recent hospital stays, but she is managing to walk  four or five times in the hallway here and will receive physical therapy at home. She asked me about stomach stapling procedures.  I am not up to date on who does these or the success rate and will defer any recommendations to Drs. Jayme Cloud and [place]. DD:  08/02/99 TD:  08/02/99 Job: [id number]

## 2010-11-10 NOTE — Assessment & Plan Note (Signed)
[facility]                               PULMONARY OFFICE NOTE   [name], [name]                        MRN:          [id number]  DATE:04/03/2006                            DOB:          01/13/1961    This is a 50 year old morbidly obese female with chronic tracheostomy due to  severe obstructive sleep apnea who presents for followup.  She states that  she has had difficulties with generalized malaise and myalgias over the last  4 days.  She has had subjective fevers, but no chills, but these have been  low grade according to her, but she really has not taken her temperature.  She states that she has had decreased appetite and some nausea.  She has  some Phenergan for nausea normally.  She denies any change in her  secretions.  These are clear.  She has had no respiratory difficulties over  her usual.  These are more related to her morbidly obese status.  She  apparently was weighed recently and she weighs 477 pounds.   The patient states that for her weight reduction surgery at [facility], they have  requested a copy of her sleep study.  I reminded her that I inherited her  case from Dr. Marcelyn Bruins, and at that time her diagnosis was made purely  on a clinical basis as the patient would be entirely apneic.  This was  witnessed in a hospitalization around 2000.  The procedure for tracheostomy  was done on a semi-emergent basis, and therefore, no formal study exists  though the patient has, again, severe difficulties with apnea, which were  too numerous to note.  The sleep study, if any in question, was done by Dr.  Lazarus Salines of ear, nose, and throat prior to her tracheostomy.   CURRENT MEDICATIONS:  As noted on the separate sheet, which the patient  brought.  We have reviewed this in detail.   PHYSICAL EXAM:  VITAL SIGNS:  Noted an oxygen saturation was 92% on room  air.  GENERAL:  This is a morbidly obese female who is in no acute distress.  She  does not appear acutely ill.  HEENT:  Unremarkable.  NECK:  She has a tracheostomy in place, which is clean, and there are no  excessive secretions.  LUNGS:  Clear.  CARDIAC:  Regular rate and rhythm.  No rubs, murmurs, or gallops heard.  ABDOMEN:  The patient has a massive pannus.  EXTREMITIES:  She has chronic stasis changes.   IMPRESSION:  1. Chronic tracheostomy status due to morbid obesity with obesity      hypoventilation syndrome and obstructive sleep apnea.  2. Acute viral syndrome.   PLAN:  1. I instructed the patient to push fluids and she could use Tylenol for      her aches and pains.  I have also instructed her to see her primary      care physician if symptoms do not improve.  She does not have acute      decompensation of her respiratory status and, as such, I do  not see any      need for antibiotics at present, as her secretions are at baseline.  2. Continue other medications as they are per her primary care physician.  3. Followup will be in 2 months' time.  She is to contact us prior to that      time should any problems arise.  I also instructed the patient to wait      until her acute illness abates and obtain flu vaccine once the symptoms      clear.       Gailen Shelter, MD      CLG/MedQ  DD:  04/08/2006  DT:  04/08/2006  Job #:  [id number]   cc:   [name], [name]

## 2010-11-10 NOTE — Op Note (Signed)
[facility]  Patient:    [name], [name] Visit Number: [id number] MRN: [id number]          Service Type: DSU Location: [facility] Attending Physician:  Leonard Schwartz Dictated by:   Janine Limbo, [name] Proc. Date: 03/05/01 Admit Date:  03/05/2001   CC:         Consuella Lose C. [name], [name]  Barbaraann Share, [name] [facility]  Linward Foster, [name]  Danice Goltz, [name]   Operative Report  PREOPERATIVE DIAGNOSES:       1. Menometrorrhagia.                               2. Morbid obesity (weight greater than 450 lb).                               3. Multiple medical problems.  POSTOPERATIVE DIAGNOSES:      1. Menometrorrhagia.                               2. Morbid obesity (weight greater than 450 lb).                               3. Multiple medical problems.                               4. Skin breakdown due to obesity.  PROCEDURES:                   1. Dilatation and curettage.                               2. Dressing of skin wounds.  SURGEON:                      Janine Limbo, [name]  ANESTHESIA:                   IV sedation and paracervical block.  INDICATIONS:                  The patient is a 50 year old female, gravida 0, who presents with a history of irregular bleeding.  She has been on Coumadin in the past.  She is morbidly obese.  We were unable to obtain an endometrial sample in the office.  Her Pap smear was normal, however.  The patient understands the indications for her procedure and she accepts the risks of but not limited to anesthetic complications, bleeding, infection and possible damage to surrounding organs.  FINDINGS:                     The patients weight was greater than 450 lb, and her procedure was extremely difficult.  The patient had a massive panniculus.  We had four operating room nurses hold the panniculus out of the way so that we could attempt our procedure.  Because the procedure was so difficult, we  decided to forgo hysteroscopy and proceed directly to sampling of the endometrium via dilatation and curettage.  The uterus sounded to 9 cm. We were unable to adequately examine the fundus of the  uterus because of the patients massive obesity.  No abnormal areas were detected inside the uterus on dilatation and curettage.  The patient had several areas of skin breakdown to the right of midline above the symphysis.  There was no evidence of deep cellulitis, however.  DESCRIPTION OF PROCEDURE:     The patient was taken to the operating room, where she was given medication through her IV line, as well as medication through her trach.  The perineum was prepped with multiple layers of Betadine, as was the vagina.  A Foley catheter was placed in the bladder.  The patient was sterilely draped.  We had a great deal of difficulty visualizing the cervix because of the patients massive obesity.  We were finally able to put a single-tooth tenaculum on the cervix.  A paracervical block was placed using 0.5% Marcaine and a total of 10 cc.  The uterus sounded to 9 cm.  The cervix was gradually dilated.  The uterine cavity was curetted using a medium sharp curet until it was felt to be clean.  A #8 suction curet was then used to explore the uterine cavity and to remove all fragments of tissue.  Hemostasis was noted to be adequate.  The instruments were removed.  The areas of skin breakdown were then cleaned with peroxide and saline.  The areas were dried. Antibiotic ointment was applied.   A sterile drape was applied.  The patient was given Cefotan intraoperatively.  The patient did tolerate her procedure well.  Sponge, needle and instrument counts were correct.  Estimated blood loss was 20 cc.  The patient tolerated her procedure well.  She was awakened from her anesthetic and taken to the recovery room in stable condition.  FOLLOW-UP INSTRUCTIONS:       The patient will be observed overnight in  the hospital.  She will return to see Dr. Stefano Gaul in 2-3 weeks for follow-up examination.  She will call for questions or concerns. Dictated by:   Janine Limbo, [name] Attending Physician:  Leonard Schwartz DD:  03/05/01 TD:  03/05/01 Job: [id number]

## 2010-11-10 NOTE — Discharge Summary (Signed)
[place]. [facility]  Patient:    [name], [name]                        MRN: [id number] Adm. Date:  [id number] Disc. Date: [id number] Attending:  Selina Cooley CC:         [name], [name]  Armanda Magic, [name]  Barbaraann Share, [name] [facility]   Discharge Summary  DISCHARGE DIAGNOSES:  1. Congestive heart failure exacerbation.  2. History of congestive heart failure secondary to cor pulmonale from     chronic hypoxia, pulmonary hypertension.  3. Bronchitis - viral versus bacterial etiology.  4. Right lower extremity cellulitis - recurrent problem.  5. Diabetes mellitus, diet controlled - capillary blood glucose ranging in     the low 100s this admission; hemoglobin A1c in May 2002 was 6.2.  6. Morbid obesity - not a candidate for gastric bypass surgery secondary to     cardiopulmonary problems.  Discharge weight of 470.8 pounds.  7. Sleep apnea with obesity hypoventilation syndrome - status post     tracheostomy in January 2001.  Uses tracheostomy collar 5 liters     overnight while sleeping.  8. History of methicillin-resistant Staphylococcus aureus colonization.  9. Chronic edema with recurrent cellulitis. 10. Gastroesophageal reflux disease. 11. Depression and anxiety. 12. Anticoagulation secondary to pulmonary hypertension. 13. Degenerative joint disease.  DISCHARGE MEDICATIONS:  1. Augmentin 875 mg p.o. b.i.d. x 7 additional days.  2. Vicodin 1-2 tablets q.4-6h. p.r.n. pain #12.  3. Coumadin 3 mg p.o. q.d. Monday-Friday, 6 mg p.o. q.d. Saturday and     Sunday.  4. Zaroxolyn 2.5 mg p.o. every Tuesday and every Friday.  5. K-Dur 60 mEq t.i.d.  6. Lasix 120 mg p.o. q.d.  7. Celexa 40 mg p.o. q.d.  8. BuSpar 7.5 mg p.o. b.i.d.  9. Magnesium oxide 400 mg p.o. q.d. 10. Celebrex 200 mg p.o. q.d. 11. Prevacid 30 mg p.o. q.d. 12. Reglan 10 mg q.a.c. and h.s. 13. Altace 5 mg p.o. q.d.  DIET:  Patient is advised to follow a 2 g sodium ADA 2000  calorie diet.  SPECIAL INSTRUCTIONS:  Advised to weigh herself at least weekly and to notify her P.C.P. should she gain greater than 3-5 pounds.  FOLLOW-UP:  With Dr. [name] within one week.  CONDITION ON DISCHARGE:  The patient is alert, oriented, in no acute distress with stable vital signs, saturation at 93% with ambulation.  Discharge weight of 470.8.  DISCHARGE LABORATORY:  BUN 20, creatinine 1.1, potassium 4.1.  INR prior to discharge was 2.2.  Lower extremity Dopplers negative for DVT; however, limited study secondary to body habitus.  TSH 3.5.  Urine culture and blood culture negative.  Chest x-ray consistent with mild CHF with a tracheostomy in good position.  REASON FOR ADMISSION:  The patient is a 50 year old white female with above-mentioned medical problems who is morbidly obese with associated obesity hypoventilation syndrome and sleep apnea, resultant pulmonary hypertension and cor pulmonale, who presented with fever, cough, and increasing shortness of breath.  She was given doxycycline as an outpatient but was ultimately admitted for failing outpatient therapy with progressive fever and shortness of breath.  HOSPITAL COURSE: #1 - CONGESTIVE HEART FAILURE EXACERBATION:  The patient does not know her baseline weight.  However, her admission chest x-ray was consistent with mild pulmonary edema/congestive heart failure.  She was placed on IV diuretics and was diuresed greater than 5 liters.  As  noted above, her discharge weight is approximately 470 pounds.  She will be discharged on her usual cardiac medications and was advised to take an extra dose of Lasix should she note an increase in weight of greater than 3-5 pounds over the course of a week.  #2 - OBESITY HYPOVENTILATION SYNDROME:  This issue was stable this hospitalization.  She continued to use 5 liters of oxygen overnight.  #3 - QUESTION OF VIRAL UPPER RESPIRATORY INFECTION VERSUS BRONCHITIS:   The patient did have a peak temperature of 103.  She remained afebrile through the course of this hospitalization.  Blood and urine cultures were negative.  She was ultimately switched from azithromycin to Augmentin to cover both any possible respiratory infection as well as her lower extremity cellulitis.  #4 - RIGHT LOWER EXTREMITY CELLULITIS:  With evidence of warm, edematous, erythematous right lower extremity with improvement after initiating Augmentin.  The patient will continue another week of this antibiotic and will be reassessed by her primary care Trudy Kory to determine whether or not the course of antibiotics needs to be extended. DD:  12/02/00 TD:  12/02/00 Job: [id number]

## 2010-11-10 NOTE — Discharge Summary (Signed)
[place]. [facility]  Patient:    [name], [name] Visit Number: [id number] MRN: [id number]          Service Type: MED Attending Physician:  [name]. Dictated by:   Gracelyn Nurse, M.Henry. Admit Date:  09/18/2001 Discharge Date: 09/22/2001   CC:         Bianca Arab. [name], M.Henry.   Discharge Summary  DISCHARGE DIAGNOSES:  1. Tracheobronchitis.  2. Obstructive sleep apnea:     a) status post tracheostomy in January of 2001.  3. Pulmonary hypertension.  4. History of deep vein thrombosis:     a) continuous Coumadin therapy.  5. Morbid obesity.  6. Osteoarthritis.  7. History of congestive heart failure.  8. Chronic lower extremity edema:     a) recurrent cellulitis.  9. Diabetes mellitus:     a) diet controlled. 10. History of methicillin resistant Staphylococcus aureus colonization. 11. Depression.  DISCHARGE MEDICATIONS:  1. Tequin 400 mg q.Henry. times 10 days.  2. Lasix 40 mg b.i.Henry.  3. K-Dur 20 mEq 2 b.i.Henry.  4. Celebrex 200 mg q.Henry.  5. Prevacid 30 mg q.Henry.  6. Mag oxide 400 mg q.Henry.  7. Reglan 5 mg q.Henry.  8. Zaroxolyn 2.5 mg on Monday and Friday.  9. Albuterol 2 puffs q.i.Henry. 10. Coumadin 3 mg q.Henry. Monday through Friday, and 6 mg q.Henry. Saturday and     Sunday. 11. Norvasc 5 mg q.Henry. 12. BuSpar 7.5 mg b.i.Henry. 13. Vicodin 1 q6. p.r.n.  ADMITTING HISTORY AND PHYSICAL:  This is a 50 year old morbidly obese white female with multiple medical problems who comes in with a history of productive cough.  She was treated in the emergency department earlier and seen for follow up by her primary M.Henry. who treated her with Augmentin 875 mg b.i.Henry.  She states she did not get any better.  She had temperature and increased chest tightness, and decided to come to the hospital.  HOSPITAL COURSE:  1. Tracheobronchitis:  Patient had failed outpatient therapy with Augmentin.  She has had a history of MRSA colonization, so she was started on Tequin and  vancomycin was added pending sputum cultures to see if she indeed had an MRSA infection.  Patient improved during hospital stay.  By hospital day one was afebrile.  Sputum cultures and blood cultures did not grow anything out.  Sputum cultures just grew normal flora.  The vancomycin was stopped the day before discharge and she was changed over to p.o. Tequin, tolerated it well.  So she will be discharged home on a a full two-week course of Tequin and is to follow up with her primary care physician.  2. Severe sleep apnea requiring tracheostomy:  She was maintained on her trach tube here in the hospital and regular trach maintenance was maintained.  3. All other medical problems were treated with home medications and remained stable throughout hospital stay.  DISCHARGE LABORATORIES:  Sodium 137, potassium 3.2, chloride 97, CO2 30, BUN 10, creatinine 0.9, and glucose 164.  DISPOSITION:  Patient is discharged in stable condition.  FOLLOW-UP:  She is to follow up with Dr. [name], her primary care physician, on April 07th. Dictated by:   Gracelyn Nurse, M.Henry. Attending Physician:  [name]. DD:  10/27/01 TD:  10/29/01 Job: [id number]

## 2010-11-10 NOTE — Discharge Summary (Signed)
[place]. [facility]  Patient:    [name], [name]                          MRN: [id number] Adm. Date:  07/07/99 Disc. Date: 07/19/99 Attending:  Margit Banda. Jearld Fenton, [name] CC:         [name], [name]             Danice Goltz, [name]                           Discharge Summary  ADMITTING DIAGNOSES:  Obstructive sleep apnea, obesity, hypoventilation, cor pulmonale, congestive heart failure.  DISCHARGE DIAGNOSES:  Obstructive sleep apnea, obesity, hypoventilation, cor pulmonale, congestive heart failure.  SURGICAL PROCEDURE:  Tracheotomy.  HISTORY OF PRESENT ILLNESS:  This is a 50 year old who has a long history of problems with congestive heart failure with most likely secondary to hypoventilation sleep apnea syndrome.  She has fluid overload problems and morbid obesity.  She has cor pulmonale and has been requested to undergo tracheostomy several times which she has refused up until this point.  Other sleep apnea treatments have failed.  She was admitted for tracheostomy and subsequently follow up and care.  HOSPITAL COURSE:  She was admitted to the otolaryngology service and underwent a tracheotomy which went well.  She then was cared for by the medical team for the duration of the stay.  She had some problems with hyperglycemia which was worked up and diabetes was considered.  She had fluid overload which continued through most of the hospital course.  She also had some desaturation.  Her tracheostomy was doing very nicely and on hospital day 3 she was transferred to the floor.  She was anticoagulated for DVT prophylaxis.  Her glycohemoglobin was 6.1 which was within normal limits.  She was still having some congestive failure symptoms and was being diuresed.  On the January 16,2001, she still had some crackles in her lungs consistent with congestive failure.  She was currently on a Coumadin dose of 7.5 mg.  She was placed on a ADA diet for her  hyperglycemia control.  She was still needing nebulized treatments for her pulmonary status.  Because of the obesity a specialized trache needed to be ordered and she was having a lot of problems with feeling a foreign body sensation in her trache and was a bit apprehensive about the whole tracheal situation.  Finally, from [place], a #6 Moore tube was obtained which was of adequate length and was placed without difficulty.  The patient did not initially tolerate the special order trachea with plugging.  She continued to require some nocturnal ventilator support even on July 13, 1999.  She was having a lot of thick secretion problems and pulmonary toilet was continued.  On July 13, 1999, she was changed to a speaking valve which she did very well with and had an excellent voice.  The valve did have to be cleaned frequently because of thick secretions.  She was getting still ventilated on July 14, 1999, and there was some anxiety with the whole trachea ventilation system at night.  The inner cannula of the trachea was checked which did show a crust that was removed and then the trache was replaced.  The trache kept disconnecting so it was recommended for relief of the weight of the ventilator hoses and remove some of the extraneous tubes and suction off  of the ventilator tubes so it would not pull on the trachea so severely.  She was discontinued on Rocephin for her pulmonary issues on July 17, 1999, and had a good INR of 2.7.  She was no longer requiring nocturnal ventilator support and was doing very well with this.  Her tracheal secretions were improving and as well as the purulent colored component to them.  She did have MRSA growing out of her tracheal secretions.  She had decreased peripheral edema and she was improving with all of her medical issues and discharge was arranged.  She was to have a home health nurse come out.  Dr. Jayme Cloud, her pulmonary doctor, and Dr.  [name] were to continue to care for her as an outpatient, and she was to follow up with me in 1 month for trachea change. Another trachea was ordered so she would have a replacement.  She will follow up sooner if she has any crusting or problems with the trachea.  MEDICATIONS:  All of her medications will be resumed by Dr. [name] which include Coumadin, Lasix, potassium chloride, BuSpar, Theo-Dur, albuterol, and pulmicort. DD:  09/06/99 TD:  09/06/99 Job: [id number]

## 2010-11-10 NOTE — Discharge Summary (Signed)
NAMELUCRESIA, SIMIC                 ACCOUNT NO.:  [id number]   MEDICAL RECORD NO.:  [id number]          PATIENT TYPE:  INP   LOCATION:  6740                         FACILITY:  [facility]   PHYSICIAN:  Janine Limbo, M.D.DATE OF BIRTH:  1960-07-17   DATE OF ADMISSION:  04/01/2005  DATE OF DISCHARGE:  04/08/2005                                 DISCHARGE SUMMARY   DISCHARGE DIAGNOSES:  1.  Status post hysteroscopy with dilation and curettage endometrial      ablation secondary for irregular bleeding.  2.  Morbid obesity with history of congestive heart failure in the past.  3.  History of deep venous thrombosis with chronic anticoagulation therapy.      INR on the day of discharge is 2.1.  4.  History of tachy palpitations with supraventricular tachycardia.  5.  Obstructive sleep apnea.   HISTORY OF PRESENT ILLNESS:  This is a 50 year old African-American female  who was admitted by Dr. Stefano Gaul for surgical procedure and she is being  followed by Dr. Jenne Campus for a history of palpitations.  It was indicated  that in the records of Dr. Stefano Gaul that the patient has a history of atrial  fibrillation on anticoagulation therapy with Coumadin and cardiology consult  was ordered to monitor the crossover from Coumadin to heparin and further  postprocedure to load the patient with Coumadin and reach a therapeutic INR  level.   All of his records indicated the patient has a history of supraventricular  tachycardia but not atrial fibrillation and at the same time the patient has  a history of DVT in the past given.  Given the history of morbid obesity and  history of DVT, she was maintained on anticoagulation therapy with Coumadin  indefinitely.  At the day of the consult, she maintained normal sinus  rhythm.   HOSPITAL PROCEDURE/COURSE:  Hysteroscopy, D&C, and endometrial ablation for  menorrhagia were performed on April 03, 2005, by Dr. Stefano Gaul.  The  procedure was carried out without  any complications, and no pathology was  noted on D&C.  The patient tolerated the procedure well.  The next morning,  we started loading her with Coumadin and she was Lovenox while in the  hospital.   We monitored her INR daily, and eventually on April 08, 2005, her INR was  therapeutic 2.1 and the patient was considered to be stable for discharge  home.   LABORATORY DATA:  Her CBC on April 06, 2005 showed white blood cell count  7.5, hemoglobin 11.7, hematocrit 34.3, platelet count 116.  CMET on the same  day revealed sodium 137, potassium 4.0, chloride 103, CO2 26, glucose 91,  BUN 7, creatinine 0.8, calcium 9.0.   DISCHARGE MEDICATIONS:  1.  Ditropan 5 mg t.i.d.  2.  Zetia 10 mg daily.  3.  Zocor 20 mg daily.  4.  Topamax 50 mg daily.  5.  Avandaryl 4/10 mg daily.  6.  BuSpar 7.5 mg b.i.d.  7.  K-Dur 20 mg t.i.d.  8.  Lasix 40 mg daily.  9.  Vitamin B complex twice daily.  10. Questran 4 mg one packet as needed.  11. Tarka 2/80 mg daily.  12. Cymbalta 60 mg daily.  13. Warfarin 7.5 mg daily.  14. Budesonide 0.25/10 mg inhaler as needed.  15. Fish oil three capsules daily.  16. Nexium 40 mg daily.  17. Mobic 7.5 mg daily.  18. Promethazine 25 mg as needed.  19. Xanax 0.5 mg b.i.d.   DISCHARGE INSTRUCTIONS:  She has been instructed to call with any problems  to Dr. Debria Garret office, and she will have blood drawn for pro time INR  next week post discharge.  Discharge diet:  Low-fat, low-cholesterol diet.      Raymon Mutton, P.A.      Janine Limbo, [name]  Electronically Signed    MK/MEDQ  D:  05/30/2005  T:  05/31/2005  Job:  [id number]   cc:   [facility]

## 2010-11-10 NOTE — Consult Note (Signed)
[name], HEATH NO.:  [id number]   MEDICAL RECORD NO.:  [id number]          PATIENT TYPE:  MAT   LOCATION:  [facility]C                          FACILITY:  [facility]   PHYSICIAN:  Janine Limbo, M.D.DATE OF BIRTH:  Oct 04, 1960   DATE OF CONSULTATION:  04/09/2005  DATE OF DISCHARGE:                                   CONSULTATION   HISTORY OF PRESENT ILLNESS:  [name] is a 50 year old female, gravida 0,  who presents complaining of heavy vaginal bleeding. The patient had a  dilatation and curettage with a NovaSure ablation on April 03, 2005. She  tolerated her procedure very well. The patient has a history of morbid  obesity (weight greater than 450 pounds). She also has a history of a deep  venous thrombosis and is currently on Coumadin anticoagulation. The patient  reports the onset of heavy vaginal bleeding with clots. She has not had  abdominal pain but complains of low back pain since her surgery. The patient  presents for evaluation. The pathology report returned benign.   PAST MEDICAL HISTORY:  The patient's past history is significant for morbid  obesity, hypertension, diabetes, congestive heart failure, chronic  musculoskeletal pain, acid reflux, asthma, sinus ventricular tachycardia,  obstructive sleep apnea, and respiratory failure. She is status post  tracheostomy. The patient also has a history of a lower extremity DVT and is  on Coumadin therapy.   CURRENT MEDICATIONS:  The patient is on 24 different medications and I will  not repeat that for this dictation.   DRUG ALLERGIES:  NITROGLYCERIN, CODEINE, MORPHINE, SULFA, AUGMENTIN, CIPRO,  ALLEGRA, and LOMOTIL.   SURGICAL HISTORY:  The patient had laparoscopic cholecystectomy. She also  had skin grafts in 1998. The patient had a tracheostomy as mentioned above.  The patient had a dilatation and curettage performed.   SOCIAL HISTORY:  The patient denies cigarette use, alcohol use, and  recreational  drug use.   PHYSICAL EXAM:  VITAL SIGNS:  Temperature is 98.9, pulse 90, respirations  22, blood pressure is 170/73.  HEENT:  Within normal limits. The patient is not in any acute distress.  CHEST:  Chest is significant for a few rales and minimal wheezes. HEART:  Regular rate and rhythm.  ABDOMEN:  Her abdomen is massively obese with a large panniculus. The  abdomen is nontender.  EXTREMITIES:  Her extremities are obese with stasis changes.  NEUROLOGIC EXAM:  Grossly normal.  PELVIC EXAM:  External genitalia is normal. Vagina is normal except for  blood. Cervix is nontender. Uterus could not be outlined. Adnexa no masses  could be outlined.   LABORATORY VALUES:  Hemoglobin is 12.3, hematocrit 37.1%, white blood cell  count is 8400, platelet count is 218,000. PTT is 40, PT is 25.9, INR is 2.4.   ASSESSMENT:  1.  Uterine bleeding status post NovaSure ablation and dilatation and      curettage on April 03, 2005.  2.  Chronic anticoagulation because of a history of a deep venous      thrombosis.  3.  Multiple medical problems including massive  obesity.   PLAN:  I reviewed our options for management with the patient. She  understands that uterine artery embolization will be very difficult. She  understands that a hysterectomy will be very difficult. Because her  hemoglobin is stable and her labs are felt to be appropriate, my  recommendation is that we simply observe for now. The patient agrees. She  will return to the office for follow-up examination. She will call sooner if  her status worsens.      Janine Limbo, [name]  Electronically Signed     AVS/MEDQ  D:  04/09/2005  T:  04/09/2005  Job:  [id number]   cc:   [name], [name]  Fax: [phone]   Darlin Priestly, MD  Fax: [phone]   Marcelyn Bruins, [name] [facility]  [address]  [place]  [place] [address]   Estelle June. Neva Seat, [name]  Fax: [phone]   Danice Goltz, [name] [facility]  [address] [place], [place]  [address]   Fransisco Hertz, [name]  Fax: [phone]

## 2010-11-10 NOTE — H&P (Signed)
[facility]  Patient:    [name], [name] Visit Number: [id number] MRN: [id number]          Service Type: MED Location: [phone] Attending Physician:  Selina Cooley Admit Date:  11/28/2000 Discharge Date: 12/02/2000   CC:         Consuella Lose C. [name], [name]  Barbaraann Share, [name] [facility]  Linward Foster, [name]   History and Physical  HISTORY OF PRESENT ILLNESS:   Ms. Kirn is a 50 year old female, gravida 0, who presents with a history of irregular bleeding.  The patient is morbidly obese, and we were unable to obtain an endometrial biopsy in the office.  Her Pap smear did return normal.  The patient has been on Coumadin therapy.  DRUG ALLERGIES:               No known drug allergies.  PAST MEDICAL HISTORY:         The patient has a history of morbid obesity, hypertension, congestive failure, chronic musculoskeletal pain, acid reflux, and diabetes.  The patient also has a history of asthma.  CURRENT MEDICATIONS:           1. Lasix 80 mg q.d.                                2. K-Dur 20 mg x 9 tablets each day.                                3. Magnesium oxide 1 each day.                                4. Humibid 2 tablets each day.                                5. Celebrex 200 mg each day.                                6. Coumadin 3 mg Monday through Friday and then                                   6 mg on Saturday and Sunday (has not taken                                   medication in the past 5 days).                                7. Prevacid.                                8. Reglan.                                9. Celexa.  10. BuSpar.                               11. Altace 5 mg each day.  SOCIAL HISTORY:               The patient denies cigarette use, alcohol use, and recreational drug use.  REVIEW OF SYSTEMS:            Noncontributory.  FAMILY HISTORY:               Noncontributory.  PHYSICAL  EXAMINATION:  VITAL SIGNS:                  Weight is in excess of 450 pounds.  HEENT:                        Within normal limits except for her massive obesity.  CHEST:                        Clear.  HEART:                        Regular rate and rhythm.  BREASTS:                       Without masses.  ABDOMEN:                      Massively obese.  EXTREMITIES:                  Massively obese.  NEUROLOGIC:                   Limited by her obesity and the fact that she has a very difficult time ambulating.  PELVIC:                       External genitalia normal except for obesity. The vagina is patulous.  The cervix is difficult to visualize because of massive obesity.  The uterus cannot be outlined.  Adnexa cannot be outlined.  ASSESSMENT:                   1. Irregular bleeding.                               2. Morbid obesity (weight greater than 450                                  pounds).                               3. Unable to visualize cervix and obtain                                  sampling of the endometrium.                               4. Currently on Coumadin therapy.  PLAN:  We will perform a hysteroscopy and D&C so that we can properly sample the endometrium.  The patient understands the indications for her procedure, and she accepts the associated risks.  We will start her back on her Coumadin after her surgery. Attending Physician:  Selina Cooley DD:  03/04/01 TD:  03/04/01 Job: [id number]

## 2010-11-14 ENCOUNTER — Encounter: Payer: Self-pay | Admitting: Pulmonary Disease

## 2010-11-21 ENCOUNTER — Other Ambulatory Visit: Payer: Self-pay | Admitting: Pulmonary Disease

## 2010-11-21 ENCOUNTER — Encounter: Payer: Self-pay | Admitting: Pulmonary Disease

## 2010-11-21 ENCOUNTER — Ambulatory Visit (INDEPENDENT_AMBULATORY_CARE_PROVIDER_SITE_OTHER): Payer: Medicare Other | Admitting: Pulmonary Disease

## 2010-11-21 ENCOUNTER — Telehealth: Payer: Self-pay | Admitting: Pulmonary Disease

## 2010-11-21 DIAGNOSIS — G4733 Obstructive sleep apnea (adult) (pediatric): Secondary | ICD-10-CM

## 2010-11-21 DIAGNOSIS — Z930 Tracheostomy status: Secondary | ICD-10-CM | POA: Insufficient documentation

## 2010-11-21 DIAGNOSIS — E678 Other specified hyperalimentation: Secondary | ICD-10-CM

## 2010-11-21 DIAGNOSIS — J45909 Unspecified asthma, uncomplicated: Secondary | ICD-10-CM

## 2010-11-21 DIAGNOSIS — J309 Allergic rhinitis, unspecified: Secondary | ICD-10-CM

## 2010-11-21 MED ORDER — BUDESONIDE 0.25 MG/2ML IN SUSP: 0.2500 mg | Freq: Two times a day (BID) | RESPIRATORY_TRACT | Status: DC

## 2010-11-21 MED ORDER — ALBUTEROL SULFATE (2.5 MG/3ML) 0.083% IN NEBU: 2.5000 mg | INHALATION_SOLUTION | RESPIRATORY_TRACT | Status: DC | PRN

## 2010-11-21 NOTE — Assessment & Plan Note (Signed)
As above.

## 2010-11-21 NOTE — Progress Notes (Signed)
Subjective:    Patient ID: [name], female    DOB: [birth year], 50 y.o.   MRN: [id number]  HPI CC: Jphn Jearld Fenton  50 yo female with OSA/OHS s/p trach on nocturnal vent, Asthma  She has been sleeping okay.  She gets hot flashes at times.  She had some drainage from her trach recently.  She was given septra for a UTI and her trach drainage also improved.  She denies fever.  She does not have much cough or wheeze.  She is not using her nebulizer much.  She was not able to have bariatric surgery.  She could not afford the 20% co-pay required by medicare.  Past Medical History  Diagnosis Date  . OSA (obstructive sleep apnea)   . Morbid obesity   . Depressive disorder, not elsewhere classified   . Diabetes mellitus   . Asthma   . Dyslipidemia   . CHF (congestive heart failure)      Family History  Problem Relation Age of Onset  . Diabetes Father   . Heart disease Mother   . Clotting disorder Mother      History   Social History  . Marital Status: Single    Spouse Name: N/A    Number of Children: N/A  . Years of Education: N/A   Occupational History  . Not on file.   Social History Main Topics  . Smoking status: Never Smoker   . Smokeless tobacco: Never Used  . Alcohol Use: No  . Drug Use: Not on file  . Sexually Active: Not on file   Other Topics Concern  . Not on file   Social History Narrative  . No narrative on file     Allergies  Allergen Reactions  . Allopurinol   . Ciprofloxacin   . Codeine   . Fexofenadine   . BJY:[id number]+MVHQIONGE+[id number] Acid+Aspartame   . Metronidazole   . Morphine   . Sulfonamide Derivatives      Outpatient Prescriptions Prior to Visit  Medication Sig Dispense Refill  . albuterol (PROVENTIL) (2.5 MG/3ML) 0.083% nebulizer solution Take 2.5 mg by nebulization every 4 (four) hours as needed.        . ALPRAZolam (XANAX) 0.5 MG tablet Take 0.5 mg by mouth 2 (two) times daily as needed.        . budesonide (PULMICORT) 0.25  MG/2ML nebulizer solution Take 0.25 mg by nebulization 2 (two) times daily.        . bumetanide (BUMEX) 1 MG tablet 2 tablets in am and 2 in the pm       . busPIRone (BUSPAR) 5 MG tablet 1 1/2 tablets two times a day       . chlorzoxazone (PARAFON) 500 MG tablet Take 500 mg by mouth 4 (four) times daily as needed.        . cholestyramine (QUESTRAN) 4 G packet Take 1 packet by mouth daily.        [name] dicyclomine (BENTYL) 20 MG tablet Take 20 mg by mouth 2 (two) times daily.        . diphenoxylate-atropine (LOMOTIL) 2.5-0.025 MG per tablet Take 1-2 tablets three times a day not to exceed 6 per day as needed      . DULoxetine (CYMBALTA) 30 MG capsule Take 30 mg by mouth 2 (two) times daily.        . febuxostat (ULORIC) 40 MG tablet Take 80 mg by mouth daily.        . fesoterodine (TOVIAZ) 4  MG TB24 Take 4 mg by mouth daily.        [name] gabapentin (NEURONTIN) 100 MG capsule Take 100 mg by mouth 3 (three) times daily.        [name] glimepiride (AMARYL) 4 MG tablet Take 4 mg by mouth 2 (two) times daily.        [name] guaiFENesin (MUCINEX) 600 MG 12 hr tablet Take 1,200 mg by mouth 2 (two) times daily.        [name] HYDROcodone-acetaminophen (VICODIN) 5-500 MG per tablet Take 1 tablet by mouth every 6 (six) hours as needed.        . insulin aspart (NOVOLOG) 100 UNIT/ML injection Inject into the skin. 14 units three times a day      . insulin glargine (LANTUS) 100 UNIT/ML injection Inject into the skin. 55 units twice daily      . loratadine (CLARITIN) 10 MG tablet Take 10 mg by mouth daily as needed.       . meclizine (ANTIVERT) 25 MG tablet Take 25 mg by mouth 3 (three) times daily as needed.        . metolazone (ZAROXOLYN) 2.5 MG tablet Take 5 mg by mouth. Three times a  week      . Multiple Vitamin (MULTIVITAMIN) capsule Take 1 capsule by mouth daily.        [name] omeprazole (PRILOSEC) 40 MG capsule Take 40 mg by mouth daily.        [name] oxyCODONE-acetaminophen (PERCOCET) 5-325 MG per tablet Take 1 tablet by mouth every 4  (four) hours as needed.        . potassium chloride (KLOR-CON) 20 MEQ packet Take 20 mEq by mouth 3 (three) times daily.        . primidone (MYSOLINE) 50 MG tablet 1/2 in the morning and 1 1/2 in at bedtime      . PROBIOTIC CAPS Take 2 capsules by mouth daily.        . promethazine (PHENERGAN) 25 MG tablet Take 25 mg by mouth every 6 (six) hours as needed.        . Simethicone 180 MG CAPS Take by mouth as needed.        . topiramate (TOPAMAX) 50 MG tablet Take 50 mg by mouth daily.        . valsartan (DIOVAN) 160 MG tablet Take 160 mg by mouth daily.        [name] warfarin (COUMADIN) 7.5 MG tablet Take 7.5 mg by mouth daily. As directed       . celecoxib (CELEBREX) 100 MG capsule Take 100 mg by mouth daily.        . Cholecalciferol (VITAMIN D3) 2000 UNITS capsule Take 2,000 Units by mouth daily.        [name] diltiazem (CARDIZEM CD) 120 MG 24 hr capsule Take 120 mg by mouth daily.        . fish oil-omega-3 fatty acids 1000 MG capsule Take 2 g by mouth daily.         Review of Systems     Objective:   Physical Exam  Filed Vitals:   11/21/10 1041  BP: 98/60  Pulse: 88  Temp: 98.7 F (37.1 C)  TempSrc: Oral  Weight: 441 lb 12.8 oz (200.399 kg)  SpO2: 94%    General: obese.  Nose: clear nasal discharge.  Mouth: MP 4, no oral lesions  Neck: trach site clean  Lungs: diminished breath sounds, no wheezing or rales  Heart: regular rhythm,  normal rate, and no murmurs.  Extremities: 2+ non-pitting edema  Neurologic: alert & oriented X3 and strength normal in all extremities.  Cervical Nodes: No lymphadenopathy noted  Psych: normally interactive.       Assessment & Plan:   OBSTRUCTIVE SLEEP APNEA She is to continue with nocturnal vent with trach during day.  ASTHMA, MILD She is to continue with her nebulizer medication as needed.  She will call to let us know if she needs a refill for her nebulizer medications, and whether she gets this from her pharmacy or through [facility].  ALLERGIC RHINITIS Stable.  OBESITY HYPOVENTILATION SYNDROME As above.    Morbid obesity Unfortunately she was not able to afford bariatric surgery.  Tracheostomy dependence She had some recent drainage that improved after antibiotics given for UTI.  She is to follow up with ENT for trach care.    Updated Medication List Outpatient Encounter Prescriptions as of 11/21/2010  Medication Sig Dispense Refill  . albuterol (PROVENTIL) (2.5 MG/3ML) 0.083% nebulizer solution Take 2.5 mg by nebulization every 4 (four) hours as needed.        . ALPRAZolam (XANAX) 0.5 MG tablet Take 0.5 mg by mouth 2 (two) times daily as needed.        [name] aspirin 81 MG tablet Take 81 mg by mouth daily.        . budesonide (PULMICORT) 0.25 MG/2ML nebulizer solution Take 0.25 mg by nebulization 2 (two) times daily.        . bumetanide (BUMEX) 1 MG tablet 2 tablets in am and 2 in the pm       . busPIRone (BUSPAR) 5 MG tablet 1 1/2 tablets two times a day       . chlorzoxazone (PARAFON) 500 MG tablet Take 500 mg by mouth 4 (four) times daily as needed.        . cholestyramine (QUESTRAN) 4 G packet Take 1 packet by mouth daily.        . colchicine (COLCRYS) 0.6 MG tablet Take 0.6 mg by mouth daily.        [name] dicyclomine (BENTYL) 20 MG tablet Take 20 mg by mouth 2 (two) times daily.        . diphenoxylate-atropine (LOMOTIL) 2.5-0.025 MG per tablet Take 1-2 tablets three times a day not to exceed 6 per day as needed      . DULoxetine (CYMBALTA) 30 MG capsule Take 30 mg by mouth 2 (two) times daily.        . ergocalciferol (VITAMIN D2) 50000 UNITS capsule Take 50,000 Units by mouth once a week.        . febuxostat (ULORIC) 40 MG tablet Take 80 mg by mouth daily.        . fesoterodine (TOVIAZ) 4 MG TB24 Take 4 mg by mouth daily.        [name] gabapentin (NEURONTIN) 100 MG capsule Take 100 mg by mouth 3 (three) times daily.        [name] glimepiride (AMARYL) 4 MG tablet Take 4 mg by mouth 2 (two) times daily.        [name]  guaiFENesin (MUCINEX) 600 MG 12 hr tablet Take 1,200 mg by mouth 2 (two) times daily.        [name] HYDROcodone-acetaminophen (VICODIN) 5-500 MG per tablet Take 1 tablet by mouth every 6 (six) hours as needed.        . insulin aspart (NOVOLOG) 100 UNIT/ML injection Inject into the skin. 14 units  three times a day      . insulin glargine (LANTUS) 100 UNIT/ML injection Inject into the skin. 55 units twice daily      . loratadine (CLARITIN) 10 MG tablet Take 10 mg by mouth daily as needed.       . meclizine (ANTIVERT) 25 MG tablet Take 25 mg by mouth 3 (three) times daily as needed.        . metolazone (ZAROXOLYN) 2.5 MG tablet Take 5 mg by mouth. Three times a  week      . Multiple Vitamin (MULTIVITAMIN) capsule Take 1 capsule by mouth daily.        [name] omeprazole (PRILOSEC) 40 MG capsule Take 40 mg by mouth daily.        [name] oxyCODONE-acetaminophen (PERCOCET) 5-325 MG per tablet Take 1 tablet by mouth every 4 (four) hours as needed.        . potassium chloride (KLOR-CON) 20 MEQ packet Take 20 mEq by mouth 3 (three) times daily.        . primidone (MYSOLINE) 50 MG tablet 1/2 in the morning and 1 1/2 in at bedtime      . PROBIOTIC CAPS Take 2 capsules by mouth daily.        . promethazine (PHENERGAN) 25 MG tablet Take 25 mg by mouth every 6 (six) hours as needed.        . Simethicone 180 MG CAPS Take by mouth as needed.        . topiramate (TOPAMAX) 50 MG tablet Take 50 mg by mouth daily.        . valsartan (DIOVAN) 160 MG tablet Take 160 mg by mouth daily.        [name] warfarin (COUMADIN) 7.5 MG tablet Take 7.5 mg by mouth daily. As directed       . DISCONTD: celecoxib (CELEBREX) 100 MG capsule Take 100 mg by mouth daily.        [name] DISCONTD: Cholecalciferol (VITAMIN D3) 2000 UNITS capsule Take 2,000 Units by mouth daily.        [name] DISCONTD: diltiazem (CARDIZEM CD) 120 MG 24 hr capsule Take 120 mg by mouth daily.        [name] DISCONTD: fish oil-omega-3 fatty acids 1000 MG capsule Take 2 g by mouth daily.

## 2010-11-21 NOTE — Telephone Encounter (Signed)
Please send order to [facility] to have budesonide and albuterol renewed.  Give one month supply of each with 11 refills.

## 2010-11-21 NOTE — Patient Instructions (Signed)
Follow up in 6 months 

## 2010-11-21 NOTE — Assessment & Plan Note (Signed)
Stable

## 2010-11-21 NOTE — Assessment & Plan Note (Signed)
She had some recent drainage that improved after antibiotics given for UTI.  She is to follow up with ENT for trach care.

## 2010-11-21 NOTE — Telephone Encounter (Signed)
Will forward to VS as FYI   

## 2010-11-21 NOTE — Assessment & Plan Note (Signed)
Unfortunately she was not able to afford bariatric surgery.

## 2010-11-21 NOTE — Telephone Encounter (Signed)
Rxs printed and placed in your lookat folder to be signed, thanks!

## 2010-11-21 NOTE — Assessment & Plan Note (Signed)
She is to continue with nocturnal vent with trach during day.

## 2010-11-21 NOTE — Assessment & Plan Note (Signed)
She is to continue with her nebulizer medication as needed.  She will call to let us know if she needs a refill for her nebulizer medications, and whether she gets this from her pharmacy or through [facility].

## 2010-12-19 ENCOUNTER — Ambulatory Visit: Payer: Medicare Other | Admitting: Dietician

## 2010-12-19 ENCOUNTER — Encounter: Payer: Medicare Other | Attending: Surgery | Admitting: Dietician

## 2010-12-19 DIAGNOSIS — E119 Type 2 diabetes mellitus without complications: Secondary | ICD-10-CM | POA: Insufficient documentation

## 2010-12-19 DIAGNOSIS — Z713 Dietary counseling and surveillance: Secondary | ICD-10-CM | POA: Insufficient documentation

## 2010-12-23 ENCOUNTER — Emergency Department (HOSPITAL_COMMUNITY)
Admission: EM | Admit: 2010-12-23 | Discharge: 2010-12-23 | Disposition: A | Discharge status: Home / Self Care | Payer: Medicare Other | Source: Home / Self Care | Attending: Emergency Medicine | Admitting: Emergency Medicine

## 2010-12-23 ENCOUNTER — Emergency Department (HOSPITAL_COMMUNITY): Payer: Medicare Other

## 2010-12-23 DIAGNOSIS — E860 Dehydration: Secondary | ICD-10-CM | POA: Insufficient documentation

## 2010-12-23 DIAGNOSIS — Z930 Tracheostomy status: Secondary | ICD-10-CM | POA: Insufficient documentation

## 2010-12-23 DIAGNOSIS — R197 Diarrhea, unspecified: Secondary | ICD-10-CM | POA: Insufficient documentation

## 2010-12-23 DIAGNOSIS — E119 Type 2 diabetes mellitus without complications: Secondary | ICD-10-CM | POA: Insufficient documentation

## 2010-12-23 DIAGNOSIS — R109 Unspecified abdominal pain: Secondary | ICD-10-CM | POA: Insufficient documentation

## 2010-12-23 LAB — CBC
HCT: 39.9 % (ref 36.0–46.0)
MCHC: 32.6 g/dL (ref 30.0–36.0)
MCV: 92.4 fL (ref 78.0–100.0)
RDW: 14.6 % (ref 11.5–15.5)

## 2010-12-23 LAB — URINALYSIS, ROUTINE W REFLEX MICROSCOPIC
Glucose, UA: NEGATIVE mg/dL
Hgb urine dipstick: NEGATIVE
Protein, ur: NEGATIVE mg/dL
Specific Gravity, Urine: 1.014 (ref 1.005–1.030)
Urobilinogen, UA: 0.2 mg/dL (ref 0.0–1.0)

## 2010-12-23 LAB — DIFFERENTIAL
Eosinophils Relative: 1 % (ref 0–5)
Lymphocytes Relative: 29 % (ref 12–46)
Lymphs Abs: 3 10*3/uL (ref 0.7–4.0)
Monocytes Absolute: 1.3 10*3/uL — ABNORMAL HIGH (ref 0.1–1.0)

## 2010-12-23 LAB — COMPREHENSIVE METABOLIC PANEL
ALT: 39 U/L — ABNORMAL HIGH (ref 0–35)
AST: 34 U/L (ref 0–37)
Albumin: 3.8 g/dL (ref 3.5–5.2)
BUN: 46 mg/dL — ABNORMAL HIGH (ref 6–23)
CO2: 30 mEq/L (ref 19–32)
Creatinine, Ser: 1.53 mg/dL — ABNORMAL HIGH (ref 0.50–1.10)
Sodium: 133 mEq/L — ABNORMAL LOW (ref 135–145)
Total Protein: 7.9 g/dL (ref 6.0–8.3)

## 2010-12-23 LAB — LIPASE, BLOOD: Lipase: 40 U/L (ref 11–59)

## 2010-12-23 LAB — URINE MICROSCOPIC-ADD ON

## 2010-12-24 ENCOUNTER — Inpatient Hospital Stay (HOSPITAL_COMMUNITY)
Admission: EM | Admit: 2010-12-24 | Discharge: 2010-12-28 | DRG: 372 | Disposition: A | Discharge status: Home / Self Care | Payer: Medicare Other | Attending: Internal Medicine | Admitting: Internal Medicine

## 2010-12-24 DIAGNOSIS — J9610 Chronic respiratory failure, unspecified whether with hypoxia or hypercapnia: Secondary | ICD-10-CM | POA: Diagnosis present

## 2010-12-24 DIAGNOSIS — K589 Irritable bowel syndrome without diarrhea: Secondary | ICD-10-CM | POA: Diagnosis present

## 2010-12-24 DIAGNOSIS — Z7901 Long term (current) use of anticoagulants: Secondary | ICD-10-CM

## 2010-12-24 DIAGNOSIS — I10 Essential (primary) hypertension: Secondary | ICD-10-CM | POA: Diagnosis present

## 2010-12-24 DIAGNOSIS — E119 Type 2 diabetes mellitus without complications: Secondary | ICD-10-CM | POA: Diagnosis present

## 2010-12-24 DIAGNOSIS — G4733 Obstructive sleep apnea (adult) (pediatric): Secondary | ICD-10-CM | POA: Diagnosis present

## 2010-12-24 DIAGNOSIS — Z86718 Personal history of other venous thrombosis and embolism: Secondary | ICD-10-CM

## 2010-12-24 DIAGNOSIS — Z930 Tracheostomy status: Secondary | ICD-10-CM

## 2010-12-24 DIAGNOSIS — K219 Gastro-esophageal reflux disease without esophagitis: Secondary | ICD-10-CM | POA: Diagnosis present

## 2010-12-24 DIAGNOSIS — D696 Thrombocytopenia, unspecified: Secondary | ICD-10-CM | POA: Diagnosis present

## 2010-12-24 DIAGNOSIS — A0472 Enterocolitis due to Clostridium difficile, not specified as recurrent: Principal | ICD-10-CM | POA: Diagnosis present

## 2010-12-24 DIAGNOSIS — I509 Heart failure, unspecified: Secondary | ICD-10-CM | POA: Diagnosis present

## 2010-12-24 DIAGNOSIS — Z794 Long term (current) use of insulin: Secondary | ICD-10-CM

## 2010-12-24 DIAGNOSIS — Z7982 Long term (current) use of aspirin: Secondary | ICD-10-CM

## 2010-12-24 DIAGNOSIS — I498 Other specified cardiac arrhythmias: Secondary | ICD-10-CM | POA: Diagnosis present

## 2010-12-24 DIAGNOSIS — E876 Hypokalemia: Secondary | ICD-10-CM | POA: Diagnosis present

## 2010-12-24 DIAGNOSIS — F329 Major depressive disorder, single episode, unspecified: Secondary | ICD-10-CM | POA: Diagnosis present

## 2010-12-24 LAB — PROTIME-INR: INR: 3.19 — ABNORMAL HIGH (ref 0.00–1.49)

## 2010-12-24 LAB — COMPREHENSIVE METABOLIC PANEL
ALT: 33 U/L (ref 0–35)
AST: 32 U/L (ref 0–37)
Alkaline Phosphatase: 55 U/L (ref 39–117)
CO2: 31 mEq/L (ref 19–32)
Chloride: 95 mEq/L — ABNORMAL LOW (ref 96–112)
Creatinine, Ser: 0.75 mg/dL (ref 0.50–1.10)
GFR calc non Af Amer: >60 mL/min (ref >60)
Potassium: 3 mEq/L — ABNORMAL LOW (ref 3.5–5.1)
Total Bilirubin: 0.4 mg/dL (ref 0.3–1.2)

## 2010-12-24 LAB — DIFFERENTIAL
Eosinophils Absolute: 0.1 10*3/uL (ref 0.0–0.7)
Lymphocytes Relative: 13 % (ref 12–46)
Lymphs Abs: 1.1 10*3/uL (ref 0.7–4.0)
Neutrophils Relative %: 71 % (ref 43–77)

## 2010-12-24 LAB — CBC
HCT: 34.6 % — ABNORMAL LOW (ref 36.0–46.0)
MCV: 92.3 fL (ref 78.0–100.0)
Platelets: 121 10*3/uL — ABNORMAL LOW (ref 150–400)
RBC: 3.75 MIL/uL — ABNORMAL LOW (ref 3.87–5.11)
WBC: 8.4 10*3/uL (ref 4.0–10.5)

## 2010-12-25 DIAGNOSIS — E669 Obesity, unspecified: Secondary | ICD-10-CM

## 2010-12-25 DIAGNOSIS — J9620 Acute and chronic respiratory failure, unspecified whether with hypoxia or hypercapnia: Secondary | ICD-10-CM

## 2010-12-25 DIAGNOSIS — G4710 Hypersomnia, unspecified: Secondary | ICD-10-CM

## 2010-12-25 DIAGNOSIS — G4730 Sleep apnea, unspecified: Secondary | ICD-10-CM

## 2010-12-25 LAB — GLUCOSE, CAPILLARY
Glucose-Capillary: 235 mg/dL — ABNORMAL HIGH (ref 70–99)
Glucose-Capillary: 269 mg/dL — ABNORMAL HIGH (ref 70–99)

## 2010-12-25 LAB — GIARDIA/CRYPTOSPORIDIUM SCREEN(EIA)

## 2010-12-25 LAB — PROTIME-INR
INR: 1.8 — ABNORMAL HIGH (ref 0.00–1.49)
Prothrombin Time: 21.2 seconds — ABNORMAL HIGH (ref 11.6–15.2)

## 2010-12-26 LAB — DIFFERENTIAL
Eosinophils Absolute: 0.2 10*3/uL (ref 0.0–0.7)
Eosinophils Relative: 3 % (ref 0–5)
Lymphocytes Relative: 22 % (ref 12–46)
Lymphs Abs: 1.5 10*3/uL (ref 0.7–4.0)
Monocytes Relative: 16 % — ABNORMAL HIGH (ref 3–12)

## 2010-12-26 LAB — GLUCOSE, CAPILLARY
Glucose-Capillary: 209 mg/dL — ABNORMAL HIGH (ref 70–99)
Glucose-Capillary: 260 mg/dL — ABNORMAL HIGH (ref 70–99)

## 2010-12-26 LAB — CBC
HCT: 35.4 % — ABNORMAL LOW (ref 36.0–46.0)
MCH: 30.8 pg (ref 26.0–34.0)
MCV: 93.9 fL (ref 78.0–100.0)
Platelets: 94 10*3/uL — ABNORMAL LOW (ref 150–400)
RDW: 15 % (ref 11.5–15.5)

## 2010-12-26 LAB — MAGNESIUM: Magnesium: 1.3 mg/dL — ABNORMAL LOW (ref 1.5–2.5)

## 2010-12-26 LAB — BASIC METABOLIC PANEL
CO2: 33 mEq/L — ABNORMAL HIGH (ref 19–32)
Glucose, Bld: 231 mg/dL — ABNORMAL HIGH (ref 70–99)
Potassium: 3.7 mEq/L (ref 3.5–5.1)
Sodium: 138 mEq/L (ref 135–145)

## 2010-12-26 LAB — PROTIME-INR: INR: 1.66 — ABNORMAL HIGH (ref 0.00–1.49)

## 2010-12-27 LAB — GLUCOSE, CAPILLARY
Glucose-Capillary: 175 mg/dL — ABNORMAL HIGH (ref 70–99)
Glucose-Capillary: 209 mg/dL — ABNORMAL HIGH (ref 70–99)

## 2010-12-27 LAB — CBC
Hemoglobin: 11.1 g/dL — ABNORMAL LOW (ref 12.0–15.0)
MCH: 30.7 pg (ref 26.0–34.0)
MCV: 92.5 fL (ref 78.0–100.0)
Platelets: 103 10*3/uL — ABNORMAL LOW (ref 150–400)
RBC: 3.62 MIL/uL — ABNORMAL LOW (ref 3.87–5.11)
WBC: 7.7 10*3/uL (ref 4.0–10.5)

## 2010-12-27 LAB — COMPREHENSIVE METABOLIC PANEL
ALT: 33 U/L (ref 0–35)
AST: 36 U/L (ref 0–37)
Alkaline Phosphatase: 48 U/L (ref 39–117)
Calcium: 7.7 mg/dL — ABNORMAL LOW (ref 8.4–10.5)
GFR calc Af Amer: >60 mL/min (ref >60)
Glucose, Bld: 182 mg/dL — ABNORMAL HIGH (ref 70–99)
Potassium: 2.7 mEq/L — CL (ref 3.5–5.1)
Sodium: 138 mEq/L (ref 135–145)
Total Protein: 6.6 g/dL (ref 6.0–8.3)

## 2010-12-27 LAB — HEPARIN LEVEL (UNFRACTIONATED): Heparin Unfractionated: 0.21 IU/mL — ABNORMAL LOW (ref 0.30–0.70)

## 2010-12-27 LAB — POTASSIUM: Potassium: 3.1 mEq/L — ABNORMAL LOW (ref 3.5–5.1)

## 2010-12-27 LAB — PROTIME-INR: Prothrombin Time: 24 seconds — ABNORMAL HIGH (ref 11.6–15.2)

## 2010-12-28 LAB — COMPREHENSIVE METABOLIC PANEL
ALT: 48 U/L — ABNORMAL HIGH (ref 0–35)
Albumin: 3.2 g/dL — ABNORMAL LOW (ref 3.5–5.2)
Calcium: 7.1 mg/dL — ABNORMAL LOW (ref 8.4–10.5)
GFR calc Af Amer: >60 mL/min (ref >60)
Glucose, Bld: 179 mg/dL — ABNORMAL HIGH (ref 70–99)
Potassium: 3.2 mEq/L — ABNORMAL LOW (ref 3.5–5.1)
Sodium: 140 mEq/L (ref 135–145)
Total Protein: 6.8 g/dL (ref 6.0–8.3)

## 2010-12-28 LAB — CBC
HCT: 33.4 % — ABNORMAL LOW (ref 36.0–46.0)
Hemoglobin: 11 g/dL — ABNORMAL LOW (ref 12.0–15.0)
MCHC: 32.9 g/dL (ref 30.0–36.0)
RBC: 3.58 MIL/uL — ABNORMAL LOW (ref 3.87–5.11)
WBC: 7.7 10*3/uL (ref 4.0–10.5)

## 2010-12-28 LAB — STOOL CULTURE

## 2010-12-28 LAB — PROTIME-INR
INR: 2 — ABNORMAL HIGH (ref 0.00–1.49)
Prothrombin Time: 23 seconds — ABNORMAL HIGH (ref 11.6–15.2)

## 2010-12-28 LAB — GLUCOSE, CAPILLARY
Glucose-Capillary: 169 mg/dL — ABNORMAL HIGH (ref 70–99)
Glucose-Capillary: 207 mg/dL — ABNORMAL HIGH (ref 70–99)

## 2010-12-28 LAB — MAGNESIUM: Magnesium: 1.2 mg/dL — ABNORMAL LOW (ref 1.5–2.5)

## 2010-12-30 NOTE — Discharge Summary (Signed)
NAMEJABRIA, [name]:  [id number]  MEDICAL RECORD NO.:  [id number]  LOCATION:  2606                         FACILITY:  [facility]  PHYSICIAN:  Isidor Holts, [name]  DATE OF BIRTH:  1960-09-28  DATE OF ADMISSION:  12/24/2010 DATE OF DISCHARGE:  12/28/2010                              DISCHARGE SUMMARY   ADMITTING PHYSICIAN:  Mick Sell, MD with [facility].  DISCHARGING PHYSICIAN:  Isidor Holts, MD  PRIMARY CARE PHYSICIAN:  [name], MD  CHIEF COMPLAINT/REASON FOR ADMISSION:  Ms. Walczyk is a 50 year old morbidly obese female patient who has known irritable bowel syndrome as well as other multiple medical comorbidities including obesity hypoventilation syndrome and obstructive sleep apnea with tracheostomy x11 years and is on BiPAP via the vent at night.  She presented with 2 weeks of increasing diarrhea, nonbloody about 10 episodes per day, occasionally experiences nausea with the diarrhea but no vomiting.  She has crampy abdominal pain with the diarrhea.  She had a urinary tract infection 5 weeks ago and was treated with antibiotic therapy.  She tried over-the-counter antidiarrheal medications without any improvement in her symptoms.  She presented to the ER 24 hours before admission and was found to be dehydrated, although not hypotensive but somewhat tachycardic and was given IV fluids and discharged home.  A CT of the abdomen was done during that visit and had shown no etiology for her symptomatology.  Her white count at presentation was 8400 with a hemoglobin 11.1, BUN was elevated at 46 with creatinine 1.53. 24 hours later she returned to the ER due to persistant   diarrhea and weakness. On the date of admission her BUN was 25, creatinine 0.75.  Potassium was low at 3 but this is chronically low and the patient takes chronic potassium replacement.  On exam, Dr. Sampson Goon found a patient who was hemodynamically stable with a  BP of 120/50, heart rate 77.  Her abdomen was nontender and she had 2+ chronic lower extremity edema.  PAST MEDICAL HISTORY: 1. Morbid obesity. 2. Chronic hypoxemic respiratory failure secondary to obstructive     sleep apnea and obesity hypoventilation syndrome. 3. Tracheostomy in 2001 secondary to obstructive sleep apnea and     obesity hypoventilation syndrome, using for BiPAP via the vent at     night. 4. History of apparent congestive heart failure. 5. History of supraventricular tachycardia. 6. History of diabetes 2 on oral hypoglycemic agents and injectable     insulin. 7. Gastroesophageal reflux disease and apparent history of esophageal     stricture for which she has been followed by Dr. Lina Sar in the     outpatient setting. 8. History of depression. 9. Self-reported history of coronary artery disease.  ADMITTING DIAGNOSES: 1. Acute diarrhea x2 weeks, suspect etiology related to Clostridium     difficile colitis. 2. Chronic hypoxemic respiratory failure secondary to obesity     hypoventilation syndrome and obstructive sleep apnea, on nocturnal     BiPAP via the vent. 3. Morbid obesity. 4. Diabetes 2 on insulin and oral hypoglycemic agents without     significant hyperglycemia presentation. 5. Chronic hypokalemia on chronic oral potassium  replacement therapy.  DIAGNOSTICS:  CT of the abdomen and pelvis which was actually done 24 hours prior to being admitted on June 30 shows no acute abdominal pelvic findings identified.  No explanation for diarrhea demonstrated. Possible new low density liver lesions nonspecific.  Hepatic steatosis and hepatosplenomegaly otherwise appeared unchanged, intra-abdominal detail remains limited secondary to body habitus.  LABORATORY DATA:  Fecal occult blood 24 hours prior to admission was positive.  C. diff PCR positive, July 1.  Stool for Giardia and Cryptosporidium was negative.  Serum magnesium 1.3 on July 3 and on July 5 was  slightly up to 1.2, phosphorous 4.4 on July 5, potassium is up to 3.2 on July 5 after being 2.7, 24 hours prior.  Sodium 140, potassium 3.2, CO2 33, glucose 179, BUN 14, creatinine 0.88.  Liver function studies showed total bilirubin 0.3, alkaline phosphatase 50, AST 62, ALT 48.  Stool culture was negative.  INR on July 5 is 2.0.  White count 7700, hemoglobin 11, hematocrit 33.4, platelets 104,000.  Please note that her platelets June 30 were 158,000 and the patient does not carry a diagnosis of chronic thrombocytopenia.  HOSPITAL COURSE: 1. Acute diarrhea secondary to C. difficile colitis.  The patient     presented with diarrhea x2 weeks, recent receipt of antibiotics for     UTI.  She was started empirically on Flagyl and Azithromycin due to     concern she may have a non C. diff related diarrhea.  Once C. diff     was found to be positive, the Zithromax was discontinued.     Currently she is on day 5/10 of Flagyl treatment t.i.d.  The     patient also has positive fecal occult blood, most likely this is     related to acute colitis in the setting of anticoagulation prior to     admission.  Recommend repeating fecal occult blood in 6 weeks to     ensure resolution of heme positive status as well as repeating     stool for C. diff PCR after Flagyl has been completed. 2. Chronic hypoxic hypercarbic respiratory failure secondary to OSA     and OHA.  The patient has a chronic #6.0 external trache, endorses     she requires BiPAP via the vent nocturnally, although this is a     cuffless trache, so unsure how this is worked out at home.  During     the entire hospitalization, she has been on a trache collar without     any episodes of desaturation.  She uses the Passy-Muir valve during     the day.  During the hospitalization, her trache was changed out.     The patient endorses this must be done monthly.  Pulmonary Critical     Care Medicine did change out the trache and did inform the  patient     that she needed to keep her inner cannula in place at all times.     Not utilizing her inner cannula for her trache is a reason why she     requires frequent cuff changes due to excessive accumulation of     respiratory debris on the end of the trache. 3. Known chronic recurrent DVT on systemic anticoagulation with     Coumadin.  The patient presented with a mildly supratherapeutic INR     3.19.  Subsequently her INR decreased to subtherapeutic for 2 days     after admission requiring brief IV  heparin therapy.  The patient's     INR has been therapeutic x2 days at 2 and she will follow up with     Dr. Maurice Small in the office for further monitoring of INR. 4. Diabetes 2 on insulin.  Her CBGs have been consistently greater     than 200 since admission.  She is on her current home Lantus at 55     units as well as her Amaryl and resistant sliding scale insulin.     We have increased her meal coverage from 14 to 16 units this     hospitalization.  We will continue this after discharge. 5. New low density liver lesion, nonspecified.  The patient has a new     12 mm low density lesion posteriorly in the right lobe of the     liver.  This is in the setting of an obese patient with hepatic     steatosis and hepatosplenomegaly.  No recommendations from     Radiology as to whether this needs to be serially followed.  I am     dictating this, so the primary care physician will be aware of this     finding. 6. Hypomagnesemia and hypokalemia.  This is a chronic problem that has     most likely been exacerbated recently due to her recurrent     diarrhea.  The patient endorses she has a history of chronic low     magnesium but was taken off oral magnesium due to loose stools.     She was given IV repletion of 2 grams of magnesium on July 3 and     the magnesium level only came up to 1.2.  Suspect this is chronic.     The patient is tolerating well without any muscle spasms or      weakness.  In addition, she has chronic hypokalemia and is on     potassium replacement already at home.  Her potassium is up to 3.2     today and recommend she continue to follow up these levels on an     outpatient basis with her primary care physician. 7. Hypertension.  The patient was continued on her usual home     medications.  Blood pressure has been well controlled this     admission. 8. History of chest pain and angina.  The patient has remained     asymptomatic this admission.  Review of the E-chart shows the     patient has never formally been diagnosed with coronary artery     disease based on evaluation here at [facility].  She was last evaluated     by Cardiology in 2010 for chest pain and per their notes has never     undergone diagnostic cardiac catheterization.  It was felt that     during the 2010 admission that her chest pain was noncardiac and     anxiety based. 9. Morbid obesity and depression.  The patient reports this as a     chronic problem and is already on medications to help with her     depression symptoms.  The patient endorsed that she was greater     than 500 pounds a few years ago and since that time has dropped her     weight down to 400 pounds.  She states she was evaluated for     possible gastric bypass surgery in the past but she was unsure and  became frightened of the potential harmful side effects from this     procedure, so she declined to proceed. 10.History of supraventricular tachycardia.  The patient has remained     stable here and even while dehydrated did not have any rapid heart     rates.  The patient is on Bystolic prior to admission. 11.GERD and possible esophageal stricture.  The patient remains on     proton pump inhibitor.  She is followed by Dr. Juanda Chance as an     outpatient. 12.Disposition.  At the present time, the patient is appropriate for     discharge home.  She endorses that she is expecting a ride home via     private vehicle,  so we will not call EMS for transport.  I have     instructed her to follow up with her primary care physician in 1-2     weeks. 13.Acute thrombocytopenia.  In review of the patient's laboratory     values since admission,  her platelet count was 158,000     June 30 at that ER visit.  Her platelets have been as low as 94,000     this admission and are slowly trending up as of today.  She does     not have a history of chronic thrombocytopenia and suspected     etiology is due to inflammatory process from recent SIRS process     related to C. diff colitis.  Recommend she follow up with her     primary care physician and have a CBC with differential checked.     The patient is not showing any overt signs of bleeding and     hemoglobin is stable.  FINAL DISCHARGE DIAGNOSES: 1. Acute infectious diarrhea secondary to Clostridium difficile     colitis. 2. Chronic hypoxemic hypercarbic respiratory failure with trache     secondary to obstructive sleep apnea and obesity hypoventilation     syndrome, stable. 3. Known chronic recurrent DVT on systemic anticoagulation on     Coumadin requiring heparin this admission. 4. Diabetes 2 on insulin with recurrent hyperglycemia not well     controlled. 5. Acute on chronic hypomagnesemia and hypokalemia. 6. Hypertension controlled. 7. Morbid obesity and depression.  DISCHARGE MEDICATIONS: 1. Increase meal coverage insulin from 14-16 units t.i.d. 2. Flagyl 500 mg t.i.d. x5 more days for a total of 10 days of     treatment. 3. Albuterol neb t.i.d. p.r.n. 4. Alprazolam 0.5 mg daily p.r.n. 5. Amaryl 4 mg b.i.d. before meals. 6. Over-the-counter antifungal agent apply to affected areas of skin     daily p.r.n. 7. Enteric-coated aspirin 81 mg every morning. 8. Bumetanide 2 mg 2 tablets b.i.d. 9. Buspirone 5 mg 1 to 1/2 tablet b.i.d. 10.Bystolic 10 mg every morning. 11.Cholestyramine 4 grams 1 pack by mouth daily as needed for issues     with  diarrhea. 12.Colcrys 0.6 mg daily p.r.n. for gout symptoms. 13.Cymbalta 30 mg b.i.d. 14.Dicyclomine 20 mg q.i.d. 15.Diovan 160 mg daily. 16.Gabapentin 100 mg 3 capsules t.i.d. 17.Vicodin 5/500, 1 tablet every 6 hours as needed for pain. 18.Lantus insulin via SoloSTAR pen 55 units subcu b.i.d. 19.Levbid SR 0.375 mg 1 tablet by mouth q.12 h. 20.Meclizine 25 mg daily at bedtime. 21.MiraLax 17 grams daily as needed.  This is normally used for     constipation but due to recent C. difficile colitis, we have asked     the patient not to resume this medication until stools  have been     formed or solid for at least 3 days. 22.Mucinex XR 600 mg t.i.d. 23.Primidone 50 mg 1/2 to 1 tablet b.i.d. with 1/2 tablet in the     morning and 1 tablet in the evening. 24.Omeprazole 40 mg daily at bedtime. 25.Potassium chloride 20 mEq q.i.d. 26.Promethazine 25 mg q.4 h. p.r.n. nausea. 27.Pulmicort neb t.i.d. p.r.n. shortness of breath. 28.Silver sulfadiazine 1% applied under the stomach, i.e., beneath     pannus to prevent skin from breaking down.  This is done daily     p.r.n. 29.Topamax 50 mg every morning. 30.Extra Strength Tylenol 500 mg every 4 hours as needed for pain. 31.Toviaz 4 mg every morning. 32.Uloric 80 mg daily at bedtime. 33.Vitamin D 50,000 units every Friday. 34.Warfarin 5 mg daily at bedtime as instructed.  She will resume at     this time the schedule she was on prior to admission which was 1     tablet on Sunday, Saturday and Wednesday and 1.5 tablet on all the     other days. 35.Zaroxolyn 5 mg 3 times weekly, days vary. 36.Zocor 20 mg daily at bedtime.  STOP THE FOLLOWING MEDICATIONS:  Please note that her mealtime insulin has changed from 14-16 units.  OTHER DISCHARGE INSTRUCTIONS: 1. Activity:  Return to previous level of functioning, increase     activity slowly. 2. Diet:  Continue diabetic carb modified diet. 3. Wound care:  Not applicable. 4. Other treatments:   Continue routine trache care and trache collar     p.r.n. oxygen as before admission.  Continue nocturnal BiPAP or     trach collar as before admission. 5. Followup appointments:  Dr. Maurice Small, [phone], please call     to be seen for formal appointment in 1-2     weeks. 6. Anticoagulation followup:  Please call Dr. Maurice Small, [phone]     for appointment to be seen within the next 7 days for further     monitoring of your INR.     Allison L. Rennis Harding, N.P.   ______________________________ Isidor Holts, [name]    ALE/MEDQ  D:  12/28/2010  T:  12/28/2010  Job:  [id number]  cc:   [name], [name]  Electronically Signed by Junious Silk N.P. on 12/29/2010 12:31:07 PM Electronically Signed by Isidor Holts [name] on 12/30/2010 03:15:39 PM

## 2010-12-31 NOTE — H&P (Signed)
[name], MCMEEKIN NO.:  [id number]  MEDICAL RECORD NO.:  [id number]  LOCATION:  2601                         FACILITY:  [facility]  PHYSICIAN:  Mick Sell, MD DATE OF BIRTH:  [birth year]  DATE OF ADMISSION:  12/24/2010 DATE OF DISCHARGE:                             HISTORY & PHYSICAL   CHIEF COMPLAINT:  Diarrhea.  PRIMARY CARE DOCTOR:  [name], MD  PSYCHIATRIST:  [name], MD  GASTROINTESTINAL DOCTOR:  Hedwig Morton. Juanda Chance, MD  HISTORY OF PRESENT ILLNESS:  This is a 50 year old morbidly obese female with a history of irritable bowel syndrome who presents with 2 weeks of increasing diarrhea.  She states that this diarrhea is nonbloody, but there is some mucus in it.  She is using the toilet proximally 10 times a day.  She has some occasional nausea, but no vomiting.  The diarrhea is associated with crampy abdominal pain.  She of note had a UTI about 5 weeks ago and was given an antibiotic, the name of which she can remember, but she thinks it was Septra.  She has tried Pepto-Bismol, Imodium, and cholestyramine without any relief of the diarrhea.  She came to the emergency room yesterday, was found to be somewhat dehydrated, given IV fluids, and had a workup done which was unrevealing.  She returns today with a stool sample and continued symptoms.  PAST MEDICAL HISTORY: 1. Morbid obesity which is her main health disorder. 2. Major depression. 3. History of coronary artery disease. 4. History of obstructive sleep apnea, status post tracheostomy in     2001.  She is on BiPAP via vent at night. 5. History of congestive heart failure and supraventricular     tachycardia. 6. History of diabetes. 7. History of gastroesophageal reflux disease. 8. History of esophageal stricture (?).  She sees Dr. Lina Sar for     this and some dysphagia and reports that in the past she was said     had to have esophageal stretching.  REVIEW OF SYSTEMS:   Eleven systems are reviewed and negative except as per HPI.  SOCIAL HISTORY:  The patient lives alone.  No tobacco, alcohol, or drugs.  FAMILY HISTORY:  Noncontributory.  ALLERGIES:  The patient reports she is allergic to: 1. CODEINE. 2. AUGMENTIN. 3. CIPROFLOXACIN. 4. NITROGLYCERIN. 5. ALLEGRA. 6. ALLOPURINOL.  CURRENT MEDICATIONS:  Per her list she provides dated December 22, 2010, from [facility]: 1. Levbid 0.375 every 12 hours p.r.n. for abdominal cramping. 2. Percocet 5/500 one every 6 hours as needed for pain. 3. Meclizine 25 mg once daily at bedtime. 4. Promethazine 25 mg every 8 hours as needed for nausea. 5. Vitamin D 50,000 units once a week. 6. Albuterol sulfate as needed. 7. Xanax 0.5 mg orally as needed. 8. Claritin 10 mg once a day. 9. MiraLax as needed. 10.Cholestyramine 4 g as needed. 11.Mucinex as needed. 12.Topamax 50 mg nightly. 13.Pulmicort 0.25 mg/2 mL 3 times a day. 14.Cymbalta 30 mg twice daily. 15.Buspirone 5 mg 1-1/2 tablets twice daily. 16.Neurontin 100 mg tablets 3 tablets orally 3 times a day. 17.Zocor 20 mg once a day. 18.Diovan 160 mg once a day.  19.Bystolic 10 mg once a day. 20.Amaryl 4 mg twice a day. 21.Lantus 55 units subcu twice a day. 22.Colcrys 0.6 mg twice a day as needed for acute gouty flare. 23.Bumetanide 2 mg 2 tablets orally twice daily. 24.Uloric 80 mg once a day. 25.Toviaz 4 mg once a day. 26.Aspirin 81 mg once a day. 27.Coumadin 5 mg as directed. 28.Potassium chloride 20 mEq 4 times a day. 29.Omeprazole 40 mg once a day. 30.Zaroxolyn 5 mg 3 times a week. 31.Mysoline 50 mg 1/2 a tablet in the a.m. and 1 tablet in the p.m. 32.NovoLog 14 units subcu 3 times a day.  PHYSICAL EXAMINATION:  GENERAL:  The patient is a morbidly obese female. She is in no acute distress.  She is afebrile. VITAL SIGNS:  Blood pressure 120/50, heart rate of 17, and sat of 99% on a trach collar with 33% FiO2. HEENT:  The patient's pupils are  equal, round, and reactive to light and accommodation.  Extraocular movements are intact.  Sclerae are anicteric.  Oropharynx is clear.  Mucous membranes are moist.  She has a trach in place. HEART:  Heart sounds were distant. LUNGS:  Clear. ABDOMEN:  Morbidly obese with a very large pannus.  There is no tenderness. EXTREMITIES:  There is 2+ lower extremity edema. NEURO:  She is alert and oriented x3, grossly nonfocal neuro exam.  LABORATORY DATA:  The patient had a CT scan for abdomen and pelvis performed yesterday.  There is no acute abdominal pelvic findings identified, possible new density liver lesions, nonspecific.  White count 8.4, hemoglobin 11.6, platelets of 121, monocytes of 14%. Basic panel yesterday revealed a BUN of 46 and a creatinine of 1.53.  After IV fluids yesterday, the BUN is 25 today and creatinine 0.75.  Sodium is 137, potassium 3.0, chloride 95, and CO2 of 31.  LFTs are within normal limits except an albumin low at 3.2 and lipase yesterday was 40. Urinalysis yesterday showed 7-10 white cells, but was negative for nitrates and only trace for leukocyte esterase.  She had a positive fecal occult blood test yesterday and stool and C. diff studies are pending.  IMPRESSION:  This is a 50 year old morbidly obese woman, trach dependent at night for sleep apnea who has a history of irritable bowel syndrome, now has diarrhea for the last 2 weeks.  She does not have an elevated white count and this is nonbloody diarrhea, although her fecal occult blood test is positive.  I am not certain if this is an issue with her irritable bowel syndrome acting up or rather she has possible Clostridium difficile or antibiotic-associated diarrhea given that she was treated for urinary tract infection several weeks ago.  She does not appear septic or otherwise extremely ill.  PLAN: 1. Given the chronic diarrhea and the fact she has not received     antibiotics, I am going to place  her on ciprofloxacin and Flagyl     for now.  We will await stool and C. diff studies before giving her     any motility agents.  However, she may require consults from GI as     she sees Dr. Juanda Chance for her IBS.  I am not sure if she has had any     new medication started, but this could also be a med effect as she     has a very extensive medication list. 2. For her sleep apnea, I am going to place her on her home vent  settings of pressure support at night with a pressure support of     13, PEEP of 5, and FiO2 of 40 in the daytime.  She is on 30% FiO2     via trach collar. 3. Morbid obesity.  This is her true main problem, likely also     stemming from severe underlying depression.  She will not improve     with her other conditions unless this is addressed aggressively     with psychiatry and weight loss attempts.  For her other issues, I will continue her on her outpatient medications.     Mick Sell, MD     DPF/MEDQ  D:  12/24/2010  T:  12/25/2010  Job:  [id number]  Electronically Signed by Clydie Braun MD on 12/31/2010 07:53:55 PM

## 2011-03-07 ENCOUNTER — Ambulatory Visit: Payer: Medicare Other | Admitting: Dietician

## 2011-03-21 ENCOUNTER — Encounter: Payer: Medicare Other | Attending: Family Medicine | Admitting: Dietician

## 2011-03-21 ENCOUNTER — Encounter: Payer: Self-pay | Admitting: Dietician

## 2011-03-21 DIAGNOSIS — E119 Type 2 diabetes mellitus without complications: Secondary | ICD-10-CM | POA: Insufficient documentation

## 2011-03-21 DIAGNOSIS — Z713 Dietary counseling and surveillance: Secondary | ICD-10-CM | POA: Insufficient documentation

## 2011-03-21 NOTE — Patient Instructions (Signed)
   Don't skip the mid-day meal.  Have a large snack.  Cottage cheese and tomatoes or fruit, fruit and cheese, PB cracker, cup of yogurt (low fat).  Continue to try to have a protein with each meal and snack.  Continue to use lactose free milk or the soy milk or the almond breeze.

## 2011-03-22 NOTE — Progress Notes (Signed)
  Medical Nutrition Therapy:  Appt start time: 1130 end time:  12:00 PM.  Assessment:  Primary concerns today: Follow-up regarding glucose levels and weight status.  She experienced a hospitalization during late July for diarrhea, dehydration that was diagnosed with C-Difficile.  Currently she is without pain or diarrhea, energy level is decreased, and she is having a great deal of joint, particularly knee pain.  She has started to use a walker with a seat for resting, since her last visit in June.  Fasting glucose levels:[id number] over the last 4 weeks. Pre dinner glucose levels:[phone]. Current weight 424.6 with a loss of 4.7 lb since 12/19/2010.  Height is at 68 inches.Current BMI @ 70.8%. Her concerns/goals are to lower blood glucose levels and to not gain but lose weight.  MEDICATIONS: See insulin changes and additions to med.  list  DIETARY INTAKE:  24-hr recall:  B (9:15-9:30 AM): egg, meat, 1 regular toast, fruit serving  Snk ( AM) :none  L ( PM): Eats lunch only 3-4 days out of 7.  D (5:00-6:00 PM): chicken, broccoli, carrots, tomatoes.  Snk (later PM): Wanetta Funderburke or Abdinasir Spadafore not have a snack Beverages: Water, crystal light, tea with splenda  Recent physical activity: Very limited.  Now using a walker  Progress Towards Goal(s):  In progress.   Nutritional Diagnosis:  [place]-2.1 Inpaired nutrition utilization As related to glucose.  As evidenced by increased blood glucose levels and use of insulin to help control..    Intervention:  Nutrition Advise to not skip lunch and to have a large snack to keep the metabolic rate up.   Monitoring/Evaluation:  Dietary intake, exercise, blood glucose levels, and body weight between now and the first of the year.  To call for appointment, and to call with questions or concerns.

## 2011-04-06 LAB — DIFFERENTIAL
Basophils Absolute: 0
Basophils Relative: 0
Eosinophils Absolute: 0
Eosinophils Relative: 0
Lymphocytes Relative: 6 — ABNORMAL LOW
Lymphs Abs: 0.7
Monocytes Absolute: 1.1 — ABNORMAL HIGH
Monocytes Relative: 10
Neutro Abs: 9.1 — ABNORMAL HIGH
Neutrophils Relative %: 84 — ABNORMAL HIGH

## 2011-04-06 LAB — CBC
HCT: 31.7 — ABNORMAL LOW
HCT: 32.2 — ABNORMAL LOW
HCT: 32.2 — ABNORMAL LOW
HCT: 33.9 — ABNORMAL LOW
Hemoglobin: 10.7 — ABNORMAL LOW
Hemoglobin: 10.8 — ABNORMAL LOW
Hemoglobin: 11 — ABNORMAL LOW
Hemoglobin: 11.5 — ABNORMAL LOW
MCHC: 33.7
MCHC: 33.7
MCHC: 33.9
MCHC: 34.1
MCV: 91.3
MCV: 92.2
MCV: 92.3
MCV: 92.4
Platelets: 104 — ABNORMAL LOW
Platelets: 115 — ABNORMAL LOW
Platelets: 119 — ABNORMAL LOW
Platelets: 90 — ABNORMAL LOW
RBC: 3.43 — ABNORMAL LOW
RBC: 3.49 — ABNORMAL LOW
RBC: 3.53 — ABNORMAL LOW
RBC: 3.67 — ABNORMAL LOW
RDW: 15.4 — ABNORMAL HIGH
RDW: 16.3 — ABNORMAL HIGH
RDW: 16.4 — ABNORMAL HIGH
RDW: 16.6 — ABNORMAL HIGH
WBC: 10.9 — ABNORMAL HIGH
WBC: 7.1
WBC: 7.8
WBC: 7.8

## 2011-04-06 LAB — COMPREHENSIVE METABOLIC PANEL
ALT: 21
AST: 27
Albumin: 2.5 — ABNORMAL LOW
Alkaline Phosphatase: 58
BUN: 17
CO2: 28
Calcium: 9
Chloride: 96
Creatinine, Ser: 1.14
GFR calc Af Amer: >60
GFR calc non Af Amer: 52 — ABNORMAL LOW
Glucose, Bld: 196 — ABNORMAL HIGH
Potassium: 3.3 — ABNORMAL LOW
Sodium: 135
Total Bilirubin: 1
Total Protein: 6.7

## 2011-04-06 LAB — LIPID PANEL
Cholesterol: 107
HDL: 20 — ABNORMAL LOW
LDL Cholesterol: 44
Total CHOL/HDL Ratio: 5.4
Triglycerides: 216 — ABNORMAL HIGH
VLDL: 43 — ABNORMAL HIGH

## 2011-04-06 LAB — VANCOMYCIN, TROUGH: Vancomycin Tr: 23.6 — ABNORMAL HIGH

## 2011-04-06 LAB — PROTIME-INR
INR: 1.5
INR: 1.9 — ABNORMAL HIGH
INR: 2.6 — ABNORMAL HIGH
INR: 2.7 — ABNORMAL HIGH
Prothrombin Time: 18.1 — ABNORMAL HIGH
Prothrombin Time: 22.2 — ABNORMAL HIGH
Prothrombin Time: 28.5 — ABNORMAL HIGH
Prothrombin Time: 30 — ABNORMAL HIGH

## 2011-04-09 LAB — URINALYSIS, ROUTINE W REFLEX MICROSCOPIC
Bilirubin Urine: NEGATIVE
Glucose, UA: NEGATIVE
Ketones, ur: NEGATIVE
Nitrite: POSITIVE — AB
Protein, ur: 100 — AB
Specific Gravity, Urine: 1.018
Urobilinogen, UA: 0.2
pH: 6.5

## 2011-04-09 LAB — URINE CULTURE
Colony Count: NO GROWTH
Culture: NO GROWTH

## 2011-04-09 LAB — CULTURE, BLOOD (ROUTINE X 2): Culture: NO GROWTH

## 2011-04-09 LAB — BASIC METABOLIC PANEL
BUN: 24 — ABNORMAL HIGH
CO2: 30
Glucose, Bld: 263 — ABNORMAL HIGH
Potassium: 5.5 — ABNORMAL HIGH
Sodium: 131 — ABNORMAL LOW

## 2011-04-09 LAB — URINE MICROSCOPIC-ADD ON

## 2011-04-09 LAB — CK TOTAL AND CKMB (NOT AT ARMC)
CK, MB: 1.9
Relative Index: INVALID
Total CK: 58

## 2011-04-09 LAB — DIFFERENTIAL
Basophils Absolute: 0.2 — ABNORMAL HIGH
Basophils Relative: 1
Eosinophils Absolute: 0.2
Lymphs Abs: 0.6 — ABNORMAL LOW
Monocytes Absolute: 0.9 — ABNORMAL HIGH
Neutro Abs: 13.6 — ABNORMAL HIGH

## 2011-04-09 LAB — CBC
HCT: 40
Hemoglobin: 13.3
MCHC: 33.3
Platelets: 114 — ABNORMAL LOW
RDW: 15.8 — ABNORMAL HIGH

## 2011-04-09 LAB — BLOOD GAS, ARTERIAL
Acid-Base Excess: 3 — ABNORMAL HIGH
Bicarbonate: 27.7 — ABNORMAL HIGH
FIO2: 0.21
O2 Saturation: 86.8
Patient temperature: 101.6
pO2, Arterial: 57.9 — ABNORMAL LOW

## 2011-04-09 LAB — HEMOGLOBIN A1C
Hgb A1c MFr Bld: 8.2 — ABNORMAL HIGH
Mean Plasma Glucose: 215

## 2011-04-09 LAB — PROTIME-INR: Prothrombin Time: 19.6 — ABNORMAL HIGH

## 2011-04-09 LAB — TROPONIN I: Troponin I: 0.02

## 2011-04-09 LAB — D-DIMER, QUANTITATIVE (NOT AT ARMC): D-Dimer, Quant: 0.76 — ABNORMAL HIGH

## 2011-04-09 LAB — LACTATE DEHYDROGENASE: LDH: 359 — ABNORMAL HIGH

## 2011-04-09 LAB — PHOSPHORUS: Phosphorus: 3.7

## 2011-04-09 LAB — MAGNESIUM: Magnesium: 1.7

## 2011-05-22 ENCOUNTER — Ambulatory Visit: Payer: Medicare Other | Admitting: Pulmonary Disease

## 2011-05-28 ENCOUNTER — Encounter: Payer: Self-pay | Admitting: Pulmonary Disease

## 2011-05-28 ENCOUNTER — Ambulatory Visit (INDEPENDENT_AMBULATORY_CARE_PROVIDER_SITE_OTHER): Payer: Medicare Other | Admitting: Pulmonary Disease

## 2011-05-28 DIAGNOSIS — E678 Other specified hyperalimentation: Secondary | ICD-10-CM

## 2011-05-28 DIAGNOSIS — G4733 Obstructive sleep apnea (adult) (pediatric): Secondary | ICD-10-CM

## 2011-05-28 DIAGNOSIS — J45909 Unspecified asthma, uncomplicated: Secondary | ICD-10-CM

## 2011-05-28 DIAGNOSIS — Z930 Tracheostomy status: Secondary | ICD-10-CM

## 2011-05-28 MED ORDER — ALBUTEROL SULFATE (2.5 MG/3ML) 0.083% IN NEBU: 2.5000 mg | INHALATION_SOLUTION | RESPIRATORY_TRACT | Status: DC | PRN

## 2011-05-28 MED ORDER — BUDESONIDE 0.25 MG/2ML IN SUSP: 0.2500 mg | Freq: Two times a day (BID) | RESPIRATORY_TRACT | Status: DC

## 2011-05-28 NOTE — Progress Notes (Signed)
Chief Complaint  Patient presents with  . Follow-up    Pt states she uses her vent at night w/ 4 liters oxygen. Pt still has hot flashes at night. Pt states her breathing has been fine. Pt c/o cough but no more than usual  . Medication Refill    albuterol and pulmicort -[facility]   CC: [name]  History of Present Illness: [name] is a 50 y.o. female OSA/OHS s/p trach with nocturnal vent, asthma.  She was treated for C diff in July.  She has since recovered.  She has lost about 30 lbs since last visit.  She denies much cough or sputum.  She does not have wheeze or drainage from her trach.  She had to get her oxygen concentrator replaced.   Past Medical History  Diagnosis Date  . OSA (obstructive sleep apnea)   . Morbid obesity   . Depressive disorder, not elsewhere classified   . Diabetes mellitus   . Asthma   . Dyslipidemia   . CHF (congestive heart failure)   . Clostridium difficile enterocolitis 2012    Past Surgical History  Procedure Date  . Cholecystectomy   . Dilation and curettage of uterus   . Tracheostomy     Current Outpatient Prescriptions on File Prior to Visit  Medication Sig Dispense Refill  . Acetaminophen (TYLENOL) 167 MG/5ML LIQD Take 500 mg by mouth as needed.        . ALPRAZolam (XANAX) 0.5 MG tablet Take 0.5 mg by mouth 2 (two) times daily as needed.        [name] aspirin 81 MG tablet Take 81 mg by mouth daily.        [name] atorvastatin (LIPITOR) 10 MG tablet Take 10 mg by mouth daily.        . bumetanide (BUMEX) 1 MG tablet 2 tablets in am and 2 in the pm       . busPIRone (BUSPAR) 5 MG tablet 1 1/2 tablets two times a day       . chlorzoxazone (PARAFON) 500 MG tablet Take 500 mg by mouth 4 (four) times daily as needed.        . cholestyramine (QUESTRAN) 4 G packet Take 1 packet by mouth daily.        . colchicine (COLCRYS) 0.6 MG tablet Take 0.6 mg by mouth daily.        [name] dicyclomine (BENTYL) 20 MG tablet Take 20 mg by mouth 2 (two) times daily.          . diphenoxylate-atropine (LOMOTIL) 2.5-0.025 MG per tablet Take 1-2 tablets three times a day not to exceed 6 per day as needed      . DULoxetine (CYMBALTA) 30 MG capsule Take 30 mg by mouth 2 (two) times daily.        . ergocalciferol (VITAMIN D2) 50000 UNITS capsule Take 50,000 Units by mouth once a week.        . febuxostat (ULORIC) 40 MG tablet Take 80 mg by mouth daily.        . fesoterodine (TOVIAZ) 4 MG TB24 Take 4 mg by mouth daily.        [name] gabapentin (NEURONTIN) 100 MG capsule Take 100 mg by mouth 3 (three) times daily.        [name] guaiFENesin (MUCINEX) 600 MG 12 hr tablet Take 1,200 mg by mouth 2 (two) times daily.        [name] HYDROcodone-acetaminophen (VICODIN) 5-500 MG per tablet Take 1  tablet by mouth every 6 (six) hours as needed.        . insulin aspart (NOVOLOG) 100 UNIT/ML injection Inject 16 Units into the skin 3 (three) times daily before meals. 14 units three times a day      . insulin glargine (LANTUS) 100 UNIT/ML injection Inject 60 Units into the skin 2 (two) times daily. 55 units twice daily      . loratadine (CLARITIN) 10 MG tablet Take 10 mg by mouth daily as needed.       . meclizine (ANTIVERT) 25 MG tablet Take 25 mg by mouth 3 (three) times daily as needed.        . Multiple Vitamin (MULTIVITAMIN) capsule Take 1 capsule by mouth daily.        . nebivolol (BYSTOLIC) 10 MG tablet Take 10 mg by mouth daily.        [name] omeprazole (PRILOSEC) 40 MG capsule Take 40 mg by mouth daily.        . polyethylene glycol (MIRALAX / GLYCOLAX) packet Take 17 g by mouth daily.        . potassium chloride (KLOR-CON) 20 MEQ packet Take 20 mEq by mouth 3 (three) times daily.        . primidone (MYSOLINE) 50 MG tablet 1/2 in the morning and 1 1/2 in at bedtime      . PROBIOTIC CAPS Take 1 capsule by mouth daily.       . promethazine (PHENERGAN) 25 MG tablet Take 25 mg by mouth every 6 (six) hours as needed.        . Simethicone 180 MG CAPS Take by mouth as needed.        . topiramate (TOPAMAX)  50 MG tablet Take 50 mg by mouth daily.        . valsartan (DIOVAN) 160 MG tablet Take 160 mg by mouth daily.        [name] warfarin (COUMADIN) 7.5 MG tablet Take 7.5 mg by mouth daily. As directed         Allergies  Allergen Reactions  . Molds & Smuts Shortness Of Breath and Rash  . Allopurinol   . Ciprofloxacin   . Codeine   . Fexofenadine   . ZOX:[id number] Acid+Aspartame   . Metronidazole   . Morphine   . Sulfonamide Derivatives     Physical Exam:  Blood pressure 120/80, pulse 85, temperature 98.2 F (36.8 C), temperature source Oral, height 5\' 8"  (1.727 m), weight 417 lb 3.2 oz (189.241 kg), SpO2 94.00%. Body mass index is 63.44 kg/(m^2). Wt Readings from Last 3 Encounters:  05/28/11 417 lb 3.2 oz (189.241 kg)  03/21/11 424 lb 9.6 oz (192.597 kg)  11/21/10 441 lb 12.8 oz (200.399 kg)   General - Obese HEENT - Trach site clean, no oral exudate, no sinus tenderness Cardiac - s1s2 regular Chest - no wheeze/rales Abdomen - obese, soft Extremities - minimal ankle edema Skin - no rashes Neurologic - normal strength Psychiatric - normal mood, behavior   Assessment/Plan:  OBSTRUCTIVE SLEEP APNEA She is doing well with her current set up.  OBESITY HYPOVENTILATION SYNDROME As above.      Morbid obesity Encouraged her to keep up with her weight loss.  ASTHMA, MILD Will refill her albuterol and budesonide.  Tracheostomy dependence She is to follow up with ENT for trach care.       Outpatient Encounter Prescriptions as of 05/28/2011  Medication Sig Dispense Refill  . Acetaminophen (TYLENOL) 167 MG/5ML LIQD  Take 500 mg by mouth as needed.        [name] albuterol (PROVENTIL) (2.5 MG/3ML) 0.083% nebulizer solution Take 3 mLs (2.5 mg total) by nebulization every 4 (four) hours as needed.  540 mL  11  . ALPRAZolam (XANAX) 0.5 MG tablet Take 0.5 mg by mouth 2 (two) times daily as needed.        [name] aspirin 81 MG tablet Take 81 mg by mouth daily.          [name] atorvastatin (LIPITOR) 10 MG tablet Take 10 mg by mouth daily.        . budesonide (PULMICORT) 0.25 MG/2ML nebulizer solution Take 2 mLs (0.25 mg total) by nebulization 2 (two) times daily.  120 mL  11  . bumetanide (BUMEX) 1 MG tablet 2 tablets in am and 2 in the pm       . busPIRone (BUSPAR) 5 MG tablet 1 1/2 tablets two times a day       . chlorzoxazone (PARAFON) 500 MG tablet Take 500 mg by mouth 4 (four) times daily as needed.        . cholestyramine (QUESTRAN) 4 G packet Take 1 packet by mouth daily.        . colchicine (COLCRYS) 0.6 MG tablet Take 0.6 mg by mouth daily.        [name] dicyclomine (BENTYL) 20 MG tablet Take 20 mg by mouth 2 (two) times daily.        . diphenoxylate-atropine (LOMOTIL) 2.5-0.025 MG per tablet Take 1-2 tablets three times a day not to exceed 6 per day as needed      . DULoxetine (CYMBALTA) 30 MG capsule Take 30 mg by mouth 2 (two) times daily.        . ergocalciferol (VITAMIN D2) 50000 UNITS capsule Take 50,000 Units by mouth once a week.        . febuxostat (ULORIC) 40 MG tablet Take 80 mg by mouth daily.        . fesoterodine (TOVIAZ) 4 MG TB24 Take 4 mg by mouth daily.        [name] gabapentin (NEURONTIN) 100 MG capsule Take 100 mg by mouth 3 (three) times daily.        [name] guaiFENesin (MUCINEX) 600 MG 12 hr tablet Take 1,200 mg by mouth 2 (two) times daily.        [name] HYDROcodone-acetaminophen (VICODIN) 5-500 MG per tablet Take 1 tablet by mouth every 6 (six) hours as needed.        . insulin aspart (NOVOLOG) 100 UNIT/ML injection Inject 16 Units into the skin 3 (three) times daily before meals. 14 units three times a day      . insulin glargine (LANTUS) 100 UNIT/ML injection Inject 60 Units into the skin 2 (two) times daily. 55 units twice daily      . Liraglutide (VICTOZA [place]) 1.2 mg once a day       . loratadine (CLARITIN) 10 MG tablet Take 10 mg by mouth daily as needed.       . meclizine (ANTIVERT) 25 MG tablet Take 25 mg by mouth 3 (three) times daily as needed.         . metolazone (ZAROXOLYN) 5 MG tablet 3 times a week       . Multiple Vitamin (MULTIVITAMIN) capsule Take 1 capsule by mouth daily.        . nebivolol (BYSTOLIC) 10 MG tablet Take 10 mg by mouth daily.        [name]  omeprazole (PRILOSEC) 40 MG capsule Take 40 mg by mouth daily.        . polyethylene glycol (MIRALAX / GLYCOLAX) packet Take 17 g by mouth daily.        . potassium chloride (KLOR-CON) 20 MEQ packet Take 20 mEq by mouth 3 (three) times daily.        . primidone (MYSOLINE) 50 MG tablet 1/2 in the morning and 1 1/2 in at bedtime      . PROBIOTIC CAPS Take 1 capsule by mouth daily.       . promethazine (PHENERGAN) 25 MG tablet Take 25 mg by mouth every 6 (six) hours as needed.        . Simethicone 180 MG CAPS Take by mouth as needed.        . topiramate (TOPAMAX) 50 MG tablet Take 50 mg by mouth daily.        . valsartan (DIOVAN) 160 MG tablet Take 160 mg by mouth daily.        [name] warfarin (COUMADIN) 7.5 MG tablet Take 7.5 mg by mouth daily. As directed       . DISCONTD: albuterol (PROVENTIL) (2.5 MG/3ML) 0.083% nebulizer solution Take 3 mLs (2.5 mg total) by nebulization every 4 (four) hours as needed.  540 mL  11  . DISCONTD: budesonide (PULMICORT) 0.25 MG/2ML nebulizer solution Take 2 mLs (0.25 mg total) by nebulization 2 (two) times daily.  120 mL  11  . DISCONTD: glimepiride (AMARYL) 4 MG tablet Take 4 mg by mouth 2 (two) times daily.        [name] DISCONTD: metolazone (ZAROXOLYN) 2.5 MG tablet Take 5 mg by mouth. Three times a  week      . DISCONTD: oxyCODONE-acetaminophen (PERCOCET) 5-325 MG per tablet Take 1 tablet by mouth every 4 (four) hours as needed.          Suleyman Ehrman Pager:  [phone] 05/28/2011, 10:20 AM

## 2011-05-28 NOTE — Assessment & Plan Note (Signed)
Will refill her albuterol and budesonide.

## 2011-05-28 NOTE — Assessment & Plan Note (Signed)
She is doing well with her current set up.

## 2011-05-28 NOTE — Assessment & Plan Note (Signed)
As above.

## 2011-05-28 NOTE — Assessment & Plan Note (Signed)
She is to follow up with ENT for trach care.

## 2011-05-28 NOTE — Patient Instructions (Signed)
Follow up in 6 months 

## 2011-05-28 NOTE — Assessment & Plan Note (Signed)
Encouraged her to keep up with her weight loss. 

## 2011-06-01 ENCOUNTER — Other Ambulatory Visit: Payer: Self-pay | Admitting: *Deleted

## 2011-06-01 ENCOUNTER — Telehealth: Payer: Self-pay | Admitting: Oncology

## 2011-06-01 ENCOUNTER — Ambulatory Visit (HOSPITAL_BASED_OUTPATIENT_CLINIC_OR_DEPARTMENT_OTHER): Payer: Medicare Other | Admitting: Oncology

## 2011-06-01 ENCOUNTER — Ambulatory Visit: Payer: Medicare Other | Admitting: Oncology

## 2011-06-01 ENCOUNTER — Encounter: Payer: Self-pay | Admitting: Oncology

## 2011-06-01 ENCOUNTER — Other Ambulatory Visit (HOSPITAL_BASED_OUTPATIENT_CLINIC_OR_DEPARTMENT_OTHER): Payer: Medicare Other | Admitting: Lab

## 2011-06-01 DIAGNOSIS — K7689 Other specified diseases of liver: Secondary | ICD-10-CM

## 2011-06-01 DIAGNOSIS — R161 Splenomegaly, not elsewhere classified: Secondary | ICD-10-CM | POA: Insufficient documentation

## 2011-06-01 DIAGNOSIS — D696 Thrombocytopenia, unspecified: Secondary | ICD-10-CM

## 2011-06-01 HISTORY — DX: Thrombocytopenia, unspecified: D69.6

## 2011-06-01 HISTORY — DX: Splenomegaly, not elsewhere classified: R16.1

## 2011-06-01 LAB — CBC WITH DIFFERENTIAL/PLATELET
BASO%: 0.2 % (ref 0.0–2.0)
LYMPH%: 22.4 % (ref 14.0–49.7)
MCHC: 34.1 g/dL (ref 31.5–36.0)
MCV: 92.6 fL (ref 79.5–101.0)
MONO%: 11.8 % (ref 0.0–14.0)
Platelets: 157 10*3/uL (ref 145–400)
RBC: 4 10*6/uL (ref 3.70–5.45)
WBC: 6.8 10*3/uL (ref 3.9–10.3)

## 2011-06-01 LAB — COMPREHENSIVE METABOLIC PANEL
ALT: 27 U/L (ref 0–35)
AST: 33 U/L (ref 0–37)
Alkaline Phosphatase: 52 U/L (ref 39–117)
Sodium: 140 mEq/L (ref 135–145)
Total Bilirubin: 0.3 mg/dL (ref 0.3–1.2)
Total Protein: 7.4 g/dL (ref 6.0–8.3)

## 2011-06-01 NOTE — Telephone Encounter (Signed)
called pt and gv appts for [id number]

## 2011-06-01 NOTE — Progress Notes (Signed)
OFFICE PROGRESS NOTE    Astrid Divine, MD, MD [address] [employer] [place] [address]  DIAGNOSIS: 49 year old female with history of splenomegaly and mild thrombocytopenia  PRIOR THERAPY: Observation  CURRENT THERAPY: Observation  INTERVAL HISTORY: DELOROS BERETTA 50 y.o. female returns for followup visit today. Overall she is doing well she is being seen after one year. I have been following her on a yearly basis. She is otherwise denying any fevers chills night sweats headaches shortness of breath chest pain she is having some irritable bowel that with diarrhea off-and-on. She has a libido pain in her right upper cautery and which is new and likely secondary to the irritable bowel. She has not had any bleeding problems. Remainder of the 10 point review of systems is negative.  MEDICAL HISTORY: Past Medical History  Diagnosis Date  . OSA (obstructive sleep apnea)   . Morbid obesity   . Depressive disorder, not elsewhere classified   . Diabetes mellitus   . Asthma   . Dyslipidemia   . CHF (congestive heart failure)   . Clostridium difficile enterocolitis 2012  . Splenomegaly 06/01/2011  . Thrombocytopenia 06/01/2011    ALLERGIES:  is allergic to molds & smuts; allopurinol; ciprofloxacin; codeine; fexofenadine; UEA:[id number]+[id number]+QMVHQIONGE acid+aspartame; metronidazole; morphine; sulfa drugs cross reactors; and sulfonamide derivatives.  MEDICATIONS:  Current Outpatient Prescriptions  Medication Sig Dispense Refill  . Acetaminophen (TYLENOL) 167 MG/5ML LIQD Take 500 mg by mouth as needed.        [name] albuterol (PROVENTIL) (2.5 MG/3ML) 0.083% nebulizer solution Take 3 mLs (2.5 mg total) by nebulization every 4 (four) hours as needed.  540 mL  11  . ALPRAZolam (XANAX) 0.5 MG tablet Take 0.5 mg by mouth 2 (two) times daily as needed.        [name] aspirin 81 MG tablet Take 81 mg by mouth daily.        [name] atorvastatin (LIPITOR) 10 MG  tablet Take 10 mg by mouth daily.        . budesonide (PULMICORT) 0.25 MG/2ML nebulizer solution Take 2 mLs (0.25 mg total) by nebulization 2 (two) times daily.  120 mL  11  . bumetanide (BUMEX) 1 MG tablet 2 tablets in am and 2 in the pm       . busPIRone (BUSPAR) 5 MG tablet 1 1/2 tablets two times a day       . chlorzoxazone (PARAFON) 500 MG tablet Take 500 mg by mouth 4 (four) times daily as needed.        . cholestyramine (QUESTRAN) 4 G packet Take 1 packet by mouth daily.        . colchicine (COLCRYS) 0.6 MG tablet Take 0.6 mg by mouth daily.        [name] dicyclomine (BENTYL) 20 MG tablet Take 20 mg by mouth 2 (two) times daily.        . diphenoxylate-atropine (LOMOTIL) 2.5-0.025 MG per tablet Take 1-2 tablets three times a day not to exceed 6 per day as needed      . DULoxetine (CYMBALTA) 30 MG capsule Take 30 mg by mouth 2 (two) times daily.        . ergocalciferol (VITAMIN D2) 50000 UNITS capsule Take 50,000 Units by mouth once a week.        . febuxostat (ULORIC) 40 MG tablet Take 80 mg by mouth daily.        . fesoterodine (TOVIAZ) 4 MG TB24 Take 4 mg by  mouth daily.        [name] gabapentin (NEURONTIN) 100 MG capsule Take 100 mg by mouth 3 (three) times daily.        [name] guaiFENesin (MUCINEX) 600 MG 12 hr tablet Take 1,200 mg by mouth 2 (two) times daily.        [name] HYDROcodone-acetaminophen (VICODIN) 5-500 MG per tablet Take 1 tablet by mouth every 6 (six) hours as needed.        . insulin aspart (NOVOLOG) 100 UNIT/ML injection Inject 16 Units into the skin 3 (three) times daily before meals. 14 units three times a day      . insulin glargine (LANTUS) 100 UNIT/ML injection Inject 60 Units into the skin 2 (two) times daily. 55 units twice daily      . Liraglutide (VICTOZA [place]) 1.2 mg once a day       . loratadine (CLARITIN) 10 MG tablet Take 10 mg by mouth daily as needed.       . meclizine (ANTIVERT) 25 MG tablet Take 25 mg by mouth 3 (three) times daily as needed.        . metolazone (ZAROXOLYN)  5 MG tablet 3 times a week       . Multiple Vitamin (MULTIVITAMIN) capsule Take 1 capsule by mouth daily.        . nebivolol (BYSTOLIC) 10 MG tablet Take 10 mg by mouth daily.        [name] omeprazole (PRILOSEC) 40 MG capsule Take 40 mg by mouth daily.        . polyethylene glycol (MIRALAX / GLYCOLAX) packet Take 17 g by mouth daily.        . potassium chloride (KLOR-CON) 20 MEQ packet Take 20 mEq by mouth 3 (three) times daily.        . primidone (MYSOLINE) 50 MG tablet 1/2 in the morning and 1 1/2 in at bedtime      . PROBIOTIC CAPS Take 1 capsule by mouth daily.       . promethazine (PHENERGAN) 25 MG tablet Take 25 mg by mouth every 6 (six) hours as needed.        . Simethicone 180 MG CAPS Take by mouth as needed.        . topiramate (TOPAMAX) 50 MG tablet Take 50 mg by mouth daily.        . valsartan (DIOVAN) 160 MG tablet Take 160 mg by mouth daily.        [name] warfarin (COUMADIN) 7.5 MG tablet Take 7.5 mg by mouth daily. As directed         SURGICAL HISTORY:  Past Surgical History  Procedure Date  . Cholecystectomy   . Dilation and curettage of uterus   . Tracheostomy     REVIEW OF SYSTEMS:  Pertinent items are noted in HPI.   PHYSICAL EXAMINATION: General appearance: alert, cooperative and morbidly obese Resp: clear to auscultation bilaterally and normal percussion bilaterally Back: symmetric, no curvature. ROM normal. No CVA tenderness. Cardio: regular rate and rhythm, S1, S2 normal, no murmur, click, rub or gallop GI: soft, non-tender; bowel sounds normal; no masses,  no organomegaly and Abdomen is all of these Extremities: venous stasis dermatitis noted Neurologic: Alert and oriented X 3, normal strength and tone. Normal symmetric reflexes. Normal coordination and gait  ECOG PERFORMANCE STATUS: 2 - Symptomatic, <50% confined to bed  Blood pressure 145/74, pulse 74, temperature 98.1 F (36.7 C), height 5\' 8"  (1.727 m), weight 413 lb 11.2 oz (  187.653 kg).  LABORATORY DATA: Lab  Results  Component Value Date   WBC 6.8 06/01/2011   HGB 12.6 06/01/2011   HCT 37.0 06/01/2011   MCV 92.6 06/01/2011   PLT 157 06/01/2011      Chemistry      Component Value Date/Time   NA 140 12/28/2010 0815   NA 137 02/14/2010 1446   K 3.2* 12/28/2010 0815   K 4.1 02/14/2010 1446   CL 99 12/28/2010 0815   CL 96* 02/14/2010 1446   CO2 33* 12/28/2010 0815   CO2 26 02/14/2010 1446   BUN 14 12/28/2010 0815   BUN 29* 02/14/2010 1446   CREATININE 0.88 12/28/2010 0815   CREATININE 1.0 02/14/2010 1446      Component Value Date/Time   CALCIUM 7.1* 12/28/2010 0815   CALCIUM 9.0 02/14/2010 1446   ALKPHOS 50 12/28/2010 0815   ALKPHOS 42 02/14/2010 1446   AST 62* 12/28/2010 0815   AST 39* 02/14/2010 1446   ALT 48* 12/28/2010 0815   BILITOT 0.3 12/28/2010 0815   BILITOT 0.40 02/14/2010 1446       RADIOGRAPHIC STUDIES:  No results found.  ASSESSMENT: 51 year old female with splenomegaly of the city and multiple other medical problems. Overall she seems to have been pretty stable. She has a CBC performed today which is absolutely normal. Other than the irritable bowel she is doing quite well.   PLAN: Continue to follow the patient every year. We will obtained CAT scans on a as needed basis   All questions were answered. The patient knows to call the clinic with any problems, questions or concerns. We can certainly see the patient much sooner if necessary.  I spent 20 minutes counseling the patient face to face. The total time spent in the appointment was 30 minutes.    Drue Second, MD Medical/Oncology [facility] [phone] (beeper) [phone] (Office)  06/01/2011, 10:33 AM

## 2011-06-15 ENCOUNTER — Encounter: Payer: Self-pay | Admitting: Dietician

## 2011-06-15 ENCOUNTER — Encounter: Payer: Medicare Other | Attending: Family Medicine | Admitting: Dietician

## 2011-06-15 DIAGNOSIS — Z713 Dietary counseling and surveillance: Secondary | ICD-10-CM | POA: Insufficient documentation

## 2011-06-15 DIAGNOSIS — E119 Type 2 diabetes mellitus without complications: Secondary | ICD-10-CM | POA: Insufficient documentation
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
## 2011-06-15 NOTE — Patient Instructions (Signed)
   Continue to limit calories while using the Victoza.  Try to keep the calories at about 1000 calories per day.  Continue to use the non-starchy vegetables as often as possible.  Continue to cook smart, limiting oils and fats.  Plant to follow-up next year. Two hours for the year.  Divide into 4 visits.    Call the [phone] for follow-up.

## 2011-07-02 ENCOUNTER — Telehealth: Payer: Self-pay | Admitting: Pulmonary Disease

## 2011-07-02 NOTE — Telephone Encounter (Signed)
I spoke with [name] and she is checking into this. She will call back. Will await [name]'s call back. Pt aware we are working on this

## 2011-07-03 NOTE — Telephone Encounter (Signed)
Pt called back. [name]  °

## 2011-07-03 NOTE — Telephone Encounter (Signed)
Spoke with pt. She is asking about neb meds. I advised looks like we are having Lecretia with [facility] look into this. I advised will call Mayra Reel and see what the hold up is.  Called and spoke with [place]. She states that they have the pt's neb meds and have had issues with reaching her on the phone, but will try to call her now since I just spoke with her and she is at home.  I called and spoke with pt again and advised of the situation and she is aware Mayra Reel will call her, in fact she was calling as we were ending up our conversation.

## 2011-08-15 ENCOUNTER — Ambulatory Visit: Payer: Medicare Other | Admitting: Dietician

## 2011-08-21 ENCOUNTER — Ambulatory Visit: Payer: Medicare Other | Admitting: Dietician

## 2011-09-06 ENCOUNTER — Encounter: Payer: Medicare Other | Attending: Family Medicine | Admitting: Dietician

## 2011-09-06 DIAGNOSIS — Z713 Dietary counseling and surveillance: Secondary | ICD-10-CM | POA: Insufficient documentation

## 2011-09-06 DIAGNOSIS — E119 Type 2 diabetes mellitus without complications: Secondary | ICD-10-CM | POA: Insufficient documentation

## 2011-09-06 NOTE — Progress Notes (Signed)
Medical Nutrition Therapy:  Appt start time: 1000 end time:  1030.  Assessment:  Primary concerns today: Glucose levels, and not having energy.  Today, affect is flat and she verbalizes frustration with her glucose levels and her health.  Has been eating less and is not losing but gaining weight.  Weight in December was at 408.3 lb and today she has gained to 416.8 lb or 8.5 lb gain.  Her diet recall reveals that her caloric intake for most days is at 800-900 calories.  She Ranata Laughery have reached her plateau in weight and the decrease in calories is causing her to hang on to her weight given a decrease in her metabolism.   She verbalizes an interest in participating in a chair exercise program, but does not want to go to a senior center.  She has agreed to try a class with the Congregational Nurse, Lilian Kapur at either at [facility] or [facility].  This might help with jump-starting her metabolism.  She comes asking if she needs to use a different medication for the glucose control and weight loss rather than the Victoza.  She has a cousin that is currently on Bydureon.  This is a once weekly injection of essentially a long-acting byetta.  She notes he is having success with this medication.  She does however have room for increasing her Victoza dose to 1.8 mcg.   Currently I would like to see her start some activity and aim to increase her calories in the form of protein and see if this will help get her weight loss going again.  BLOOD GLUCOSE:  Fasting levels at 250-300.  After lunch and before dinner at > 200 and remains >200 in the evenings.  Does not have her glucose log today.This is recall. Given these numbers and current insulin; Lantus 60 units AM and 65 units PM  and Novolog 16 units before meals; another consideration might be an insulin pump.  Endocrinology could look at her other insulin options and determine if it is time to explore this option for Alivia.  She  would need to do a lot of work in improving her carb counting skills to make this work successfully.   MEDICATIONS: Increases in the Lantus to 60 units and 65 in the evening.  Novolog staying the same at 16 units before meals.  DIETARY INTAKE:  24-hr recall:  B (8:30-9:00 AM): 2 eggs, (and meat to get the protein), sausage patties 2, with  2 slices of whole wheat bread.  Snk (mid AM) :None  L (Mid Day): A little something.  A yogurt or popcorn bout 3 cups , something small.  Snk (mid PM): None D (6;00-7:00 PM): Meat BBQ 3/4 cup, slaw 1/4 cup, 1/2 of a hamburger bun.   Snk (evening PM): piece of fruit  Small orange or tangerine or 1/2 banana, plum, or 1 cup of cubes. Beverages: water, diet flavored water.  As a rule the above is her daily intake.  Does not have a snack at night as a rule.  Caloric intake is at 800-900 calories by recall.  Needs to increase calories to closer to 1000/day or more.  Needs more protein.  Recent physical activity: Currently no activity.  Verbalizes an interest in participating in a chair aerobics class and one that is not a Health visitor.  Estimated energy needs:HT:  68 in  WT: 416.8 lb  BMI:63.5  ADJ WT: 228 lb (103 kg) X 12 cal /Kg  1000-1200 calories  Based on 1100 calories  125-130 g carbohydrates 90-100 g protein 30-32 g fat  Progress Towards Goal(s):  No progress.   Nutritional Diagnosis:  [place]-2.1 Inpaired nutrition utilization As related to glucose.  As evidenced by diagnosis of type 2 diabetes and uncontrolled blood glucose levels even with the use of insulin..    Intervention:  Nutrition Has agreed to have protein at her lunch meal/large snack and at her evening snack. Ask that she do this on a daily basis and it will bring her up to approximately 1000 calories..  Handouts given during visit include:  Information/directions for the Congregational Nurse Chair Exercise Class  Monitoring/Evaluation:  Dietary intake, exercise, blood glucose, and  body weight in June following her appointment with Dr. [name] and is to bring a copy of her blood work.

## 2011-09-06 NOTE — Patient Instructions (Addendum)
   Call the central [facility] and see if they have chair aerobics.  [facility] [address]  Class: Tuesday at 10:00 AM  Phone: [phone]  8-10 participants in chair aerobics program  [facility] , [place] Wednesday starts at 12:00 [place] phone [phone]  [facility].[phone].  Chair aerobics program.  [name] to increase calories to closer to 1000 per day.  Have a snack at night.  Can try the cottage with fruit.  Try to have protein at the mid-day snack or mini-meal  Try doing a modified chair routine on Thursday and Saturday.  Follow-up in June, for 30 minutes and bring a copy of your blood work.

## 2011-09-17 ENCOUNTER — Ambulatory Visit: Payer: Self-pay | Admitting: Obstetrics and Gynecology

## 2011-10-18 ENCOUNTER — Ambulatory Visit: Payer: Self-pay | Admitting: Obstetrics and Gynecology

## 2011-11-15 ENCOUNTER — Telehealth: Payer: Self-pay | Admitting: Internal Medicine

## 2011-11-15 NOTE — Telephone Encounter (Signed)
Patient calling to report she has been on antibiotics x 2 rounds. States she started earlier this week with diarrhea, cramping and gas every time she eats. Denies bleeding. Hx IBS, C.diff last summer. Scheduled with Mike Gip, PA tomorrow at 9:30 AM.

## 2011-11-15 NOTE — Telephone Encounter (Signed)
I agree for her to see Amy.

## 2011-11-16 ENCOUNTER — Other Ambulatory Visit (INDEPENDENT_AMBULATORY_CARE_PROVIDER_SITE_OTHER): Payer: Medicaid Other

## 2011-11-16 ENCOUNTER — Encounter: Payer: Self-pay | Admitting: *Deleted

## 2011-11-16 ENCOUNTER — Ambulatory Visit (INDEPENDENT_AMBULATORY_CARE_PROVIDER_SITE_OTHER): Payer: Medicaid Other | Admitting: Physician Assistant

## 2011-11-16 VITALS — BP 86/52 | HR 86 | Ht 68.0 in | Wt >= 6400 oz

## 2011-11-16 DIAGNOSIS — R109 Unspecified abdominal pain: Secondary | ICD-10-CM

## 2011-11-16 DIAGNOSIS — B9689 Other specified bacterial agents as the cause of diseases classified elsewhere: Secondary | ICD-10-CM

## 2011-11-16 DIAGNOSIS — Z8619 Personal history of other infectious and parasitic diseases: Secondary | ICD-10-CM

## 2011-11-16 DIAGNOSIS — A498 Other bacterial infections of unspecified site: Secondary | ICD-10-CM

## 2011-11-16 DIAGNOSIS — R197 Diarrhea, unspecified: Secondary | ICD-10-CM

## 2011-11-16 LAB — CBC WITH DIFFERENTIAL/PLATELET
Basophils Relative: 0.3 % (ref 0.0–3.0)
Eosinophils Relative: 1 % (ref 0.0–5.0)
HCT: 38.8 % (ref 36.0–46.0)
Hemoglobin: 12.9 g/dL (ref 12.0–15.0)
Lymphocytes Relative: 24.8 % (ref 12.0–46.0)
Lymphs Abs: 2.4 10*3/uL (ref 0.7–4.0)
Monocytes Relative: 12.7 % — ABNORMAL HIGH (ref 3.0–12.0)
Neutro Abs: 5.9 10*3/uL (ref 1.4–7.7)
RBC: 4.12 Mil/uL (ref 3.87–5.11)
WBC: 9.6 10*3/uL (ref 4.5–10.5)

## 2011-11-16 LAB — BASIC METABOLIC PANEL
CO2: 32 mEq/L (ref 19–32)
Calcium: 8.9 mg/dL (ref 8.4–10.5)
Creatinine, Ser: 1 mg/dL (ref 0.4–1.2)
GFR: 59.73 mL/min — ABNORMAL LOW (ref >60.00)
Glucose, Bld: 224 mg/dL — ABNORMAL HIGH (ref 70–99)

## 2011-11-16 MED ORDER — AMBULATORY NON FORMULARY MEDICATION: 250.0000 mL | Freq: Four times a day (QID) | Status: DC

## 2011-11-16 NOTE — Patient Instructions (Signed)
Please go to the basement level to have your labs drawn.   We have given you samples of ReZyst SB Probiotic, take 1 tab twice daily for 14 days. We phoned in a prescription for Vancomycin liquid, 250 ml 4 times daily x 14 days. We phoned the prescription to [facility], [phone].

## 2011-11-16 NOTE — Progress Notes (Signed)
I agree with assessment and plans 

## 2011-11-16 NOTE — Progress Notes (Signed)
Subjective:    Patient ID: [name], female    DOB: 10-18-1960, 51 y.o.   MRN: [id number]  HPI  [name] is a 51 year old female known to Dr. Lina Sar with multiple problems, complicated by morbid obesity with weight in excess of 400 pounds. She has obstructive sleep apnea and obesity hypoventilation syndrome and requires a chronic trach. She had a history of C. difficile colitis in July of 2012 for which she was hospitalized. She underwent colonoscopy in October of 2009 and this was a normal exam. She is also known to have a mild splenomegaly and thrombocytopenia which is being followed by Dr. call on. Review of CT scan done from May of 2012 shows a diffuse hepatic steatosis and mild splenomegaly she also had some minimally prominent porta hepatis nodes  Patient comes in today with complaints of 4-5 day history of crampy lower of [place] pain and diarrhea with 3-4 bowel movements per day. She says her stools very loose but not watery somewhat malodorous. There is no melena or hematochezia. She has had some associated nausea but no vomiting. No fever or chills. She says she feels somewhat washed out especially after having a bowel movement. She says she doesn't feel anywhere as bad as she did last summer but wants to" catch this early". She did take at least 2 courses of antibiotics through March and April for lower extremity cellulitis. She takes colchicine once daily likely. She has Questran on her list but does not use this on a regular basis. She does take Bentyl twice daily chronically,and periodically will take Imodium for IBS.    Review of Systems  Constitutional: Positive for fatigue.  HENT: Negative.   Eyes: Negative.   Respiratory: Negative.   Cardiovascular: Negative.   Gastrointestinal: Positive for abdominal pain and diarrhea.  Genitourinary: Negative.   Musculoskeletal: Positive for gait problem.  Neurological: Negative.   Hematological: Negative.   Psychiatric/Behavioral:  Negative.    Outpatient Encounter Prescriptions as of 11/16/2011  Medication Sig Dispense Refill  . acetaminophen (TYLENOL) 500 MG tablet Take 500 mg by mouth as needed.      [name] albuterol (PROVENTIL) (2.5 MG/3ML) 0.083% nebulizer solution Take 3 mLs (2.5 mg total) by nebulization every 4 (four) hours as needed.  540 mL  11  . ALPRAZolam (XANAX) 0.5 MG tablet Take 0.5 mg by mouth 2 (two) times daily as needed.        [name] aspirin 81 MG tablet Take 81 mg by mouth daily.        [name] atorvastatin (LIPITOR) 10 MG tablet Take 10 mg by mouth daily.        . B COMPLEX VITAMINS PO Take 1 tablet by mouth daily.      . budesonide (PULMICORT) 0.25 MG/2ML nebulizer solution Take 2 mLs (0.25 mg total) by nebulization 2 (two) times daily.  120 mL  11  . bumetanide (BUMEX) 1 MG tablet 2 tablets in am and 2 in the pm       . busPIRone (BUSPAR) 5 MG tablet 1 1/2 tablets two times a day       . cholestyramine (QUESTRAN) 4 G packet Take 1 packet by mouth daily.        . colchicine (COLCRYS) 0.6 MG tablet Take 0.6 mg by mouth daily.        [name] dicyclomine (BENTYL) 20 MG tablet Take 20 mg by mouth 2 (two) times daily.        . DULoxetine (CYMBALTA) 30 MG  capsule Take 30 mg by mouth 2 (two) times daily.        . ergocalciferol (VITAMIN D2) 50000 UNITS capsule Take 50,000 Units by mouth once a week.        . febuxostat (ULORIC) 40 MG tablet Take 80 mg by mouth daily.        . fesoterodine (TOVIAZ) 4 MG TB24 Take 4 mg by mouth daily.        [name] gabapentin (NEURONTIN) 100 MG capsule Take 100 mg by mouth 4 (four) times daily.       [name] guaiFENesin (MUCINEX) 600 MG 12 hr tablet Take 1,200 mg by mouth 2 (two) times daily.        [name] HYDROcodone-acetaminophen (VICODIN) 5-500 MG per tablet Take 1 tablet by mouth every 6 (six) hours as needed.        . insulin aspart (NOVOLOG) 100 UNIT/ML injection Inject 16 Units into the skin 3 (three) times daily before meals.       . insulin glargine (LANTUS) 100 UNIT/ML injection Inject 65 Units into  the skin 2 (two) times daily. 60 units in AM and 65 units in the PM      . Liraglutide (VICTOZA [place]) Take 1.8 mg by mouth daily.       [name] loratadine (CLARITIN) 10 MG tablet Take 10 mg by mouth daily as needed.       . meclizine (ANTIVERT) 25 MG tablet Take 25 mg by mouth daily. Taking daily at night.      . metolazone (ZAROXOLYN) 5 MG tablet 3 times a week       . Multiple Vitamin (MULTIVITAMIN) capsule Take 1 capsule by mouth daily.        . nebivolol (BYSTOLIC) 10 MG tablet Take 10 mg by mouth daily.        [name] omeprazole (PRILOSEC) 40 MG capsule Take 40 mg by mouth daily.        . polyethylene glycol (MIRALAX / GLYCOLAX) packet Take 17 g by mouth daily. Takes as needed      . potassium chloride (KLOR-CON) 20 MEQ packet Take 20 mEq by mouth 3 (three) times daily.        . primidone (MYSOLINE) 50 MG tablet 1/2 in the morning and 1 1/2 in at bedtime      . PROBIOTIC CAPS Take 1 capsule by mouth daily.       . promethazine (PHENERGAN) 25 MG tablet Take 25 mg by mouth every 6 (six) hours as needed.        . Simethicone 180 MG CAPS Take by mouth as needed.        . topiramate (TOPAMAX) 50 MG tablet Take 50 mg by mouth daily.        . valsartan (DIOVAN) 160 MG tablet Take 160 mg by mouth daily.        [name] warfarin (COUMADIN) 7.5 MG tablet Take 7.5 mg by mouth daily. As directed      . AMBULATORY NON FORMULARY MEDICATION Take by mouth. Vancoymycin 250 ML  Take 4 times daily for 14 days      . DISCONTD: Acetaminophen (TYLENOL) 167 MG/5ML LIQD Take 500 mg by mouth as needed.        [name] DISCONTD: AMBULATORY NON FORMULARY MEDICATION Take 250 mLs by mouth 4 (four) times daily. Vancoymycin 250 ML  Take 4 times daily for 14 days  14000 mL  0  . DISCONTD: chlorzoxazone (PARAFON) 500 MG tablet Take 500 mg by  mouth 4 (four) times daily as needed.        [name] DISCONTD: diphenoxylate-atropine (LOMOTIL) 2.5-0.025 MG per tablet Take 1-2 tablets three times a day not to exceed 6 per day as needed       Allergies  Allergen  Reactions  . Molds & Smuts Shortness Of Breath and Rash  . Allopurinol   . Amoxicillin-Pot Clavulanate   . Ciprofloxacin   . Codeine   . Fexofenadine   . Metronidazole   . Morphine   . Sulfa Drugs Cross Reactors Nausea And Vomiting  . Sulfonamide Derivatives    Patient Active Problem List  Diagnoses  . Morbid obesity  . OBESITY HYPOVENTILATION SYNDROME  . OBSTRUCTIVE SLEEP APNEA  . HYPERTENSION, BENIGN  . ALLERGIC RHINITIS  . ASTHMA, MILD  . GERD  . FATTY LIVER DISEASE  . DYSPHAGIA  . DIARRHEA  . Tracheostomy dependence  . Splenomegaly  . Thrombocytopenia  . Hx of Clostridium difficile infection   History   Social History  . Marital Status: Single    Spouse Name: N/A    Number of Children: 0  . Years of Education: N/A   Occupational History  . disabled    Social History Main Topics  . Smoking status: Never Smoker   . Smokeless tobacco: Never Used  . Alcohol Use: Yes     occas-once or twice a year  . Drug Use: No  . Sexually Active: Not on file   Other Topics Concern  . Not on file   Social History Narrative  . No narrative on file       Objective:   Physical Exam well-developed morbidly obese white female examined in a chair unable to get on the exam table- pleasant in no acute distress blood pressure 86/52 (baseline ), pulse 86. Weight 418, height 5 foot 8. HEENT; nontraumatic normocephalic EOMI PERRLA sclera anicteric,Neck; Patient has trach. Cardiovascular; regular rate and rhythm with S1-S2 distant heart sounds, Pulmonary; ;distant breath sounds but clear. Abdomen; morbidly obese with very large pannus is some mild tenderness in her lower abdomen no guarding no rebound no palpable mass or hepatosplenomegaly bowel sounds are present, Rectal; exam not done, Extremities; morbidly obese with stasis changes in the lower treatment he is, Psych; mood and affect normal and appropriate        Assessment & Plan:  #22 51 year old female, morbidly obese with  permanent trach secondary to obesity hypoventilation syndrome who presents with 4-5 day history of crampy lower abdominal pain, and diarrhea. Patient does have history of C. difficile colitis  July 2012. Patient has had antibiotic exposure. We will need to rule out recurrent C. difficile colitis. Other possibilities include a viral gastroenteritis. Plan; CBC with differential and BMET today Stool for C. difficile PCR and lactoferrin. Increase Bentyl to 10 mg by mouth 4 times daily Advised patient to not use antidiarrheals or Questran for now as this can make C. difficile more difficult clear Start Rezyst one by mouth twice daily ( Sachromyces), samples given x14 days Given prior history of C. 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  Soft low roughage diet Patient is aware should her symptoms worsen or she has concern for dehydration that she should proceed to the emergency room for evaluation.

## 2011-11-22 ENCOUNTER — Other Ambulatory Visit: Payer: MEDICARE

## 2011-11-22 ENCOUNTER — Telehealth: Payer: Self-pay | Admitting: Physician Assistant

## 2011-11-22 DIAGNOSIS — R197 Diarrhea, unspecified: Secondary | ICD-10-CM

## 2011-11-22 DIAGNOSIS — R109 Unspecified abdominal pain: Secondary | ICD-10-CM

## 2011-11-22 DIAGNOSIS — A498 Other bacterial infections of unspecified site: Secondary | ICD-10-CM

## 2011-11-22 NOTE — Telephone Encounter (Signed)
I think she has Questran at home -she can take a dose once daily in am at least 2 hours away from all her other meds, nothing else til we know if cdiff or not

## 2011-11-22 NOTE — Telephone Encounter (Signed)
Patient calling to report she is taking the Vancomycin and Probiotic but is still having loose stools after she eats. States the stools are not watery just not formed. She has had 3 stools today. She just brought the stool samples back today.. Wants to know if there is anything else she can take the for loose stools. Please, advise.

## 2011-11-22 NOTE — Telephone Encounter (Signed)
Patient given Mike Gip, PA recommendation.

## 2011-11-23 LAB — CLOSTRIDIUM DIFFICILE BY PCR: Toxigenic C. Difficile by PCR: NOT DETECTED

## 2011-11-30 ENCOUNTER — Ambulatory Visit: Payer: Medicare Other | Admitting: Pulmonary Disease

## 2011-12-05 ENCOUNTER — Ambulatory Visit (INDEPENDENT_AMBULATORY_CARE_PROVIDER_SITE_OTHER): Payer: Medicare Other | Admitting: Obstetrics and Gynecology

## 2011-12-05 ENCOUNTER — Encounter: Payer: Self-pay | Admitting: Obstetrics and Gynecology

## 2011-12-05 VITALS — BP 136/84 | HR 86 | Resp 14 | Ht 68.0 in | Wt >= 6400 oz

## 2011-12-05 DIAGNOSIS — Z0000 Encounter for general adult medical examination without abnormal findings: Secondary | ICD-10-CM

## 2011-12-05 DIAGNOSIS — Z124 Encounter for screening for malignant neoplasm of cervix: Secondary | ICD-10-CM

## 2011-12-05 DIAGNOSIS — Z01419 Encounter for gynecological examination (general) (routine) without abnormal findings: Secondary | ICD-10-CM

## 2011-12-05 NOTE — Progress Notes (Signed)
Regular Periods: no Mammogram: 09/2011"WNL"  Monthly Breast Ex.: yes Exercise: yes  Tetanus < 10 years: yes Seatbelts: yes  NI. Bladder Functn.: no Taking Medication to help. Abuse at home: no  Daily BM's: yes Stressful Work: no  Healthy Diet: yes Sigmoid-Colonoscopy: per pt 4-5 years ago."   Calcium: yes Medical problems this year: wants to talk about recent hot flashes.    LAST PAP:08/01/2010 "WNL"  Contraception: per pt none  Mammogram:  09/2011  PCP: Dr.Elaine [name]  PMH: per pt No changes.  FMH: per pt No changes.   Last Bone Scan: per pt 2-3 years ago.  Subjective:    [name] is a 51 y.o. female G0P0 who presents for annual exam. The patient has no complaints today. The patient is morbidly obese and has had multiple medical problems associated with her condition including hypertension, diabetes, sleep apnea, and obstructive breathing.  She has a tracheostomy.  The following portions of the patient's history were reviewed and updated as appropriate: allergies, current medications, past family history, past medical history, past social history, past surgical history and problem list. See above.  Review of Systems Pertinent items are noted in HPI. Gastrointestinal:No change in bowel habits, no abdominal pain, no rectal bleeding Genitourinary:negative for dysuria, frequency, hematuria, nocturia and urinary incontinence    Objective:     BP 136/84  Pulse 86  Resp 14  Ht 5\' 8"  (1.727 m)  Wt 420 lb (190.511 kg)  BMI 63.86 kg/m2  Weight:  Wt Readings from Last 1 Encounters:  12/05/11 420 lb (190.511 kg)     BMI: Body mass index is 63.86 kg/(m^2). General Appearance: Alert, appropriate appearance for age. No acute distress HEENT: Grossly normal Neck / Thyroid: Supple, no masses, nodes or enlargement. Tracheostomy present. Lungs: clear to auscultation bilaterally Back: No CVA tenderness Breast Exam: No masses or nodes.No dimpling, nipple retraction or  discharge. Cardiovascular: Regular rate and rhythm. S1, S2, no murmur Gastrointestinal: Soft, non-tender, no masses or organomegaly  ++++++++++++++++++++++++++++++++++++++++++++++++++++++++  Pelvic Exam: External genitalia: normal general appearance Vaginal: normal mucosa without prolapse or lesions Cervix: normal appearance Adnexa: unable to palpate due to massively obese abdomen Uterus: unable to outline because of massively obese abdomen Rectovaginal: normal rectal, no masses  ++++++++++++++++++++++++++++++++++++++++++++++++++++++++  Lymphatic Exam: Non-palpable nodes in neck, clavicular, axillary, or inguinal regions  Psychiatric: Alert and oriented, appropriate affect.    Urinalysis:Not done      Assessment:    Normal gyn exam   Overweight or obese: Yes  Pelvic relaxation: No  Menopausal symptoms: Yes. Severe: No.   Plan:    Mammogram. Pap smear.   Follow-up:  for annual exam  STD screen request: none,  RPR: No,  HBsAg: No.  Hepatitis C: No.  The updated Pap smear screening guidelines were discussed with the patient. The patient requested that I obtain a Pap smear: Yes.  Kegel exercises discussed: No.  Annual mammograms recommended starting at age 5. Proper breast care was discussed.  Screening colonoscopy is recommended beginning at age 20.  Regular health maintenance was reviewed.   Mylinda Latina.D.

## 2011-12-06 LAB — PAP IG W/ RFLX HPV ASCU

## 2011-12-10 ENCOUNTER — Other Ambulatory Visit: Payer: Self-pay | Admitting: Internal Medicine

## 2011-12-10 MED ORDER — CHOLESTYRAMINE 4 G PO PACK: 1.0000 | PACK | Freq: Every day | ORAL | Status: DC

## 2011-12-10 NOTE — Telephone Encounter (Signed)
rx sent

## 2011-12-12 ENCOUNTER — Ambulatory Visit: Payer: Medicare Other | Admitting: Dietician

## 2011-12-13 ENCOUNTER — Encounter: Payer: Self-pay | Admitting: Dietician

## 2011-12-13 ENCOUNTER — Encounter: Payer: Medicaid Other | Attending: Family Medicine | Admitting: Dietician

## 2011-12-13 DIAGNOSIS — E119 Type 2 diabetes mellitus without complications: Secondary | ICD-10-CM | POA: Insufficient documentation

## 2011-12-13 DIAGNOSIS — Z713 Dietary counseling and surveillance: Secondary | ICD-10-CM | POA: Insufficient documentation

## 2011-12-13 NOTE — Patient Instructions (Addendum)
   Next time you have that bedtime craving, check your blood sugar and have a snack of protein and starch.  See snack list.  Talk to Dr. Regino Schultze office regarding participating in the research study.  Sugar alcohol in sugar free products [name] irritate the bowel.  Start to pay attention to the effects of the sugar free products on the number of bowel movements.  Use the canned fruit packed in juice rather than the fresh fruit.  Pour off the juice and just eat the fruit.  For meals, eat what you think will not cause a diarrhea problem.  Then add questionable foods in alone so you can tell if they are the problem.  Keep a record.    Get back to the lactaide milk.  Try to use a cereal that has only 0-9 gm of sugar per serving.  At night, try to not have cereal.  Do it at another time of day.  Need to eat something at least every 4 hours. If not a meal, have a large snack.

## 2011-12-13 NOTE — Progress Notes (Signed)
  Medical Nutrition Therapy:  Appt start time: 1030 end time:  1115.  Assessment:  Primary concerns today: Gaining weight, blood glucose levels are increasing, and having daily diarrhea after most meals each day.  Since her last appointment on 3/14/2013she has gained 10 lb.   Also experiencing periods of craving foods at bedtime.  Current intake varies with the meal, she will frequently skip the mid-day meal. The meal for the reference day we covered, contained as two pop-tarts and we discussed the impact of the sugar in the pop tart on her blood glucose levels.  Because of the diarrhea associated with meals and eating, she requested nutritional information related to irritable bowel syndrome.  She feels that she [name] have some issues with irritable bowel or she is on some medications that is causing her to have frequent diarrhea.  She took he rQuestran this morning to try to prevent having diarrhea stools while she was out and about for her appointments. Continues to have pain in her joints, especially the knees.  She is awaiting the time for the lubricating shot to help with pain relief in the knees.  BLOOD GLUCOSE LEVELS:  Sampling of numbers: Fasting: [phone] Before Dinner: 172,195,272,233,314,256.230,304,276,[phone].  HYPOGLYCEMIA:  Gives no history of glucose lows.  Currently continues with hyperglycemia and increasing insulin levels.  MEDICATIONS: Continues with Lantus 70 units in the AM and PM along with Novolog 16 units three times daily before meals.  DIETARY INTAKE:  24-hr recall:  B (9:00 AM): English muffin with low sugar spread, 2 eggs scrambled and bacon.  Bowl of oatmeal (pack of oatmeal) Diet ginger ale. Snk (mid AM) :none  L (3:00 PM): 2 pop tarts strawberry flavored.  Snk (mid PM): none D (6:00 PM): pork chop, baked with mac and cheese, cucumber and tomato cut- up and a few grapes with the diet ginger ale. Snk (bedtime PM):  none Beverages: Ginger ale, water.  Recent physical activity: Activities of daily living reports that she has no energy to participate in any form of daily activity.  Will follow-up with the recommendation that if she is able to get the intestinal issues under control, she might consider trying to get to the chair exercise class at least once per week.      Estimated energy needs: 1100-1200 calories 125-130 g carbohydrates 90-100 g protein 30-32 g fat  Progress Towards Goal(s):  In progress.   Nutritional Diagnosis:  [place]-2.1 Inpaired nutrition utilization As related to blood glucose.  As evidenced by diagnosis of type 2 diabetes and dependence on insulin for glucose control, yet uncontrolled..    Intervention:  Nutrition Ask that she avoid the concentrated sweets and try to get at least 2 meals and in the middle of the day, have a snack of protein and a starch or fruit and avoid the concentrated sweets.  Handouts given during visit include:  American Dietetic Association Diet Manual recommendations for the self-management of irritable bowel syndrome.  AVS   Monitoring/Evaluation:  Dietary intake, exercise, blood glucose levels, and body weight in three months, to call with questions, when needed.

## 2011-12-26 ENCOUNTER — Encounter (HOSPITAL_COMMUNITY): Payer: Self-pay | Admitting: Pharmacy Technician

## 2011-12-28 ENCOUNTER — Other Ambulatory Visit: Payer: Self-pay | Admitting: Otolaryngology

## 2012-01-01 ENCOUNTER — Emergency Department (HOSPITAL_COMMUNITY): Payer: Medicare Other

## 2012-01-01 ENCOUNTER — Inpatient Hospital Stay (HOSPITAL_COMMUNITY)
Admission: EM | Admit: 2012-01-01 | Discharge: 2012-01-03 | DRG: 603 | Disposition: A | Discharge status: Home / Self Care | Payer: Medicare Other | Attending: Internal Medicine | Admitting: Internal Medicine

## 2012-01-01 ENCOUNTER — Other Ambulatory Visit: Payer: Self-pay

## 2012-01-01 ENCOUNTER — Encounter (HOSPITAL_COMMUNITY): Payer: Self-pay | Admitting: *Deleted

## 2012-01-01 DIAGNOSIS — I10 Essential (primary) hypertension: Secondary | ICD-10-CM

## 2012-01-01 DIAGNOSIS — E669 Obesity, unspecified: Secondary | ICD-10-CM

## 2012-01-01 DIAGNOSIS — J45909 Unspecified asthma, uncomplicated: Secondary | ICD-10-CM

## 2012-01-01 DIAGNOSIS — Z833 Family history of diabetes mellitus: Secondary | ICD-10-CM

## 2012-01-01 DIAGNOSIS — L03319 Cellulitis of trunk, unspecified: Principal | ICD-10-CM | POA: Diagnosis present

## 2012-01-01 DIAGNOSIS — R197 Diarrhea, unspecified: Secondary | ICD-10-CM

## 2012-01-01 DIAGNOSIS — Z930 Tracheostomy status: Secondary | ICD-10-CM

## 2012-01-01 DIAGNOSIS — Z888 Allergy status to other drugs, medicaments and biological substances status: Secondary | ICD-10-CM

## 2012-01-01 DIAGNOSIS — Z7982 Long term (current) use of aspirin: Secondary | ICD-10-CM

## 2012-01-01 DIAGNOSIS — R1319 Other dysphagia: Secondary | ICD-10-CM

## 2012-01-01 DIAGNOSIS — Z7901 Long term (current) use of anticoagulants: Secondary | ICD-10-CM

## 2012-01-01 DIAGNOSIS — D696 Thrombocytopenia, unspecified: Secondary | ICD-10-CM

## 2012-01-01 DIAGNOSIS — L0291 Cutaneous abscess, unspecified: Secondary | ICD-10-CM

## 2012-01-01 DIAGNOSIS — G4733 Obstructive sleep apnea (adult) (pediatric): Secondary | ICD-10-CM

## 2012-01-01 DIAGNOSIS — Z794 Long term (current) use of insulin: Secondary | ICD-10-CM

## 2012-01-01 DIAGNOSIS — L02219 Cutaneous abscess of trunk, unspecified: Principal | ICD-10-CM | POA: Diagnosis present

## 2012-01-01 DIAGNOSIS — E119 Type 2 diabetes mellitus without complications: Secondary | ICD-10-CM | POA: Diagnosis present

## 2012-01-01 DIAGNOSIS — J309 Allergic rhinitis, unspecified: Secondary | ICD-10-CM

## 2012-01-01 DIAGNOSIS — E678 Other specified hyperalimentation: Secondary | ICD-10-CM

## 2012-01-01 DIAGNOSIS — K7689 Other specified diseases of liver: Secondary | ICD-10-CM

## 2012-01-01 DIAGNOSIS — Z882 Allergy status to sulfonamides status: Secondary | ICD-10-CM

## 2012-01-01 DIAGNOSIS — Z8619 Personal history of other infectious and parasitic diseases: Secondary | ICD-10-CM

## 2012-01-01 DIAGNOSIS — R161 Splenomegaly, not elsewhere classified: Secondary | ICD-10-CM

## 2012-01-01 DIAGNOSIS — F329 Major depressive disorder, single episode, unspecified: Secondary | ICD-10-CM | POA: Diagnosis present

## 2012-01-01 DIAGNOSIS — Z6841 Body Mass Index (BMI) 40.0 and over, adult: Secondary | ICD-10-CM

## 2012-01-01 DIAGNOSIS — G609 Hereditary and idiopathic neuropathy, unspecified: Secondary | ICD-10-CM | POA: Diagnosis present

## 2012-01-01 DIAGNOSIS — E1169 Type 2 diabetes mellitus with other specified complication: Secondary | ICD-10-CM | POA: Diagnosis present

## 2012-01-01 DIAGNOSIS — E785 Hyperlipidemia, unspecified: Secondary | ICD-10-CM | POA: Diagnosis present

## 2012-01-01 DIAGNOSIS — F3289 Other specified depressive episodes: Secondary | ICD-10-CM | POA: Diagnosis present

## 2012-01-01 DIAGNOSIS — L0390 Cellulitis, unspecified: Secondary | ICD-10-CM

## 2012-01-01 DIAGNOSIS — Z8249 Family history of ischemic heart disease and other diseases of the circulatory system: Secondary | ICD-10-CM

## 2012-01-01 DIAGNOSIS — Z881 Allergy status to other antibiotic agents status: Secondary | ICD-10-CM

## 2012-01-01 DIAGNOSIS — K219 Gastro-esophageal reflux disease without esophagitis: Secondary | ICD-10-CM

## 2012-01-01 LAB — URINALYSIS, ROUTINE W REFLEX MICROSCOPIC
Nitrite: NEGATIVE
Protein, ur: NEGATIVE mg/dL
Specific Gravity, Urine: 1.019 (ref 1.005–1.030)
Urobilinogen, UA: 0.2 mg/dL (ref 0.0–1.0)

## 2012-01-01 LAB — CBC WITH DIFFERENTIAL/PLATELET
Eosinophils Relative: 0 % (ref 0–5)
HCT: 36.2 % (ref 36.0–46.0)
Hemoglobin: 12 g/dL (ref 12.0–15.0)
Lymphocytes Relative: 13 % (ref 12–46)
Lymphs Abs: 1.3 10*3/uL (ref 0.7–4.0)
MCV: 94.8 fL (ref 78.0–100.0)
Monocytes Absolute: 1.3 10*3/uL — ABNORMAL HIGH (ref 0.1–1.0)
Monocytes Relative: 13 % — ABNORMAL HIGH (ref 3–12)
RBC: 3.82 MIL/uL — ABNORMAL LOW (ref 3.87–5.11)
RDW: 14.8 % (ref 11.5–15.5)
WBC: 10 10*3/uL (ref 4.0–10.5)

## 2012-01-01 LAB — COMPREHENSIVE METABOLIC PANEL
BUN: 19 mg/dL (ref 6–23)
CO2: 35 mEq/L — ABNORMAL HIGH (ref 19–32)
Calcium: 8.9 mg/dL (ref 8.4–10.5)
Creatinine, Ser: 0.86 mg/dL (ref 0.50–1.10)
GFR calc Af Amer: >90 mL/min (ref >90)
GFR calc non Af Amer: 78 mL/min — ABNORMAL LOW (ref >90)
Glucose, Bld: 282 mg/dL — ABNORMAL HIGH (ref 70–99)
Total Bilirubin: 0.5 mg/dL (ref 0.3–1.2)

## 2012-01-01 LAB — GLUCOSE, CAPILLARY: Glucose-Capillary: 232 mg/dL — ABNORMAL HIGH (ref 70–99)

## 2012-01-01 LAB — URINE MICROSCOPIC-ADD ON

## 2012-01-01 MED ORDER — MULTIVITAMINS PO CAPS: 1.0000 | ORAL_CAPSULE | Freq: Every day | ORAL | Status: DC

## 2012-01-01 MED ORDER — CLINDAMYCIN PHOSPHATE 900 MG/50ML IV SOLN
900.0000 mg | Freq: Once | INTRAVENOUS | Status: DC
  Administered 2012-01-01: 900 mg via INTRAVENOUS
  Filled 2012-01-01 (×2): qty 50

## 2012-01-01 MED ORDER — ALBUTEROL SULFATE (5 MG/ML) 0.5% IN NEBU
2.5000 mg | INHALATION_SOLUTION | Freq: Four times a day (QID) | RESPIRATORY_TRACT | Status: DC
  Administered 2012-01-02: 2.5 mg via RESPIRATORY_TRACT
  Filled 2012-01-01 (×2): qty 0.5

## 2012-01-01 MED ORDER — LORATADINE 10 MG PO TABS
10.0000 mg | ORAL_TABLET | Freq: Every day | ORAL | Status: DC | PRN
  Filled 2012-01-01: qty 1

## 2012-01-01 MED ORDER — SIMETHICONE 80 MG PO CHEW
80.0000 mg | CHEWABLE_TABLET | Freq: Four times a day (QID) | ORAL | Status: DC | PRN
  Filled 2012-01-01: qty 1

## 2012-01-01 MED ORDER — NEBIVOLOL HCL 10 MG PO TABS
10.0000 mg | ORAL_TABLET | Freq: Every day | ORAL | Status: DC
  Administered 2012-01-02 – 2012-01-03 (×2): 10 mg via ORAL
  Filled 2012-01-01 (×3): qty 1

## 2012-01-01 MED ORDER — OMEGA-3 FATTY ACIDS 1000 MG PO CAPS: 2.0000 g | ORAL_CAPSULE | Freq: Every day | ORAL | Status: DC

## 2012-01-01 MED ORDER — AZTREONAM IN DEXTROSE 2 GM/50ML IV SOLN
2.0000 g | Freq: Three times a day (TID) | INTRAVENOUS | Status: DC
  Administered 2012-01-02 (×2): 2 g via INTRAVENOUS
  Filled 2012-01-01 (×5): qty 50

## 2012-01-01 MED ORDER — VANCOMYCIN HCL 1000 MG IV SOLR
1500.0000 mg | Freq: Two times a day (BID) | INTRAVENOUS | Status: DC
  Filled 2012-01-01 (×2): qty 1500

## 2012-01-01 MED ORDER — FEBUXOSTAT 40 MG PO TABS
80.0000 mg | ORAL_TABLET | Freq: Every day | ORAL | Status: DC
  Administered 2012-01-02 (×2): 80 mg via ORAL
  Filled 2012-01-01 (×3): qty 2

## 2012-01-01 MED ORDER — ACETAMINOPHEN 650 MG RE SUPP: 650.0000 mg | Freq: Four times a day (QID) | RECTAL | Status: DC | PRN

## 2012-01-01 MED ORDER — VITAMIN D (ERGOCALCIFEROL) 1.25 MG (50000 UNIT) PO CAPS
50000.0000 [IU] | ORAL_CAPSULE | ORAL | Status: DC
  Filled 2012-01-01: qty 1

## 2012-01-01 MED ORDER — RISAQUAD PO CAPS
1.0000 | ORAL_CAPSULE | Freq: Every day | ORAL | Status: DC
  Administered 2012-01-02 – 2012-01-03 (×2): 1 via ORAL
  Filled 2012-01-01 (×3): qty 1

## 2012-01-01 MED ORDER — ERGOCALCIFEROL 1.25 MG (50000 UT) PO CAPS: 50000.0000 [IU] | ORAL_CAPSULE | ORAL | Status: DC

## 2012-01-01 MED ORDER — GABAPENTIN 100 MG PO CAPS
100.0000 mg | ORAL_CAPSULE | Freq: Four times a day (QID) | ORAL | Status: DC
  Administered 2012-01-01 – 2012-01-03 (×6): 100 mg via ORAL
  Filled 2012-01-01 (×9): qty 1

## 2012-01-01 MED ORDER — ADULT MULTIVITAMIN W/MINERALS CH
1.0000 | ORAL_TABLET | Freq: Every day | ORAL | Status: DC
  Filled 2012-01-01: qty 1

## 2012-01-01 MED ORDER — COLCHICINE 0.6 MG PO TABS
0.6000 mg | ORAL_TABLET | Freq: Every day | ORAL | Status: DC
  Filled 2012-01-01: qty 1

## 2012-01-01 MED ORDER — INSULIN GLARGINE 100 UNIT/ML ~~LOC~~ SOLN
70.0000 [IU] | Freq: Two times a day (BID) | SUBCUTANEOUS | Status: DC
  Administered 2012-01-02: 70 [IU] via SUBCUTANEOUS

## 2012-01-01 MED ORDER — WARFARIN SODIUM 5 MG PO TABS: 5.0000 mg | ORAL_TABLET | Freq: Every day | ORAL | Status: DC

## 2012-01-01 MED ORDER — DULOXETINE HCL 30 MG PO CPEP
30.0000 mg | ORAL_CAPSULE | Freq: Two times a day (BID) | ORAL | Status: DC
  Administered 2012-01-01 – 2012-01-03 (×4): 30 mg via ORAL
  Filled 2012-01-01 (×5): qty 1

## 2012-01-01 MED ORDER — TOPIRAMATE 25 MG PO TABS: 50.0000 mg | ORAL_TABLET | Freq: Every day | ORAL | Status: DC

## 2012-01-01 MED ORDER — DICYCLOMINE HCL 20 MG PO TABS
20.0000 mg | ORAL_TABLET | Freq: Two times a day (BID) | ORAL | Status: DC
  Administered 2012-01-02 – 2012-01-03 (×3): 20 mg via ORAL
  Filled 2012-01-01 (×4): qty 1

## 2012-01-01 MED ORDER — CLINDAMYCIN PHOSPHATE 900 MG/50ML IV SOLN
900.0000 mg | Freq: Four times a day (QID) | INTRAVENOUS | Status: DC
  Administered 2012-01-02 (×2): 900 mg via INTRAVENOUS
  Filled 2012-01-01 (×5): qty 50

## 2012-01-01 MED ORDER — MECLIZINE HCL 25 MG PO TABS
25.0000 mg | ORAL_TABLET | Freq: Every day | ORAL | Status: DC
  Administered 2012-01-01: 25 mg via ORAL
  Filled 2012-01-01 (×2): qty 1

## 2012-01-01 MED ORDER — PROMETHAZINE HCL 25 MG PO TABS: 25.0000 mg | ORAL_TABLET | Freq: Four times a day (QID) | ORAL | Status: DC | PRN

## 2012-01-01 MED ORDER — ACETAMINOPHEN 325 MG PO TABS
650.0000 mg | ORAL_TABLET | Freq: Four times a day (QID) | ORAL | Status: DC | PRN
Start: 2012-01-01 — End: 2012-01-03

## 2012-01-01 MED ORDER — FESOTERODINE FUMARATE ER 4 MG PO TB24
4.0000 mg | ORAL_TABLET | Freq: Every day | ORAL | Status: DC
  Administered 2012-01-02 – 2012-01-03 (×2): 4 mg via ORAL
  Filled 2012-01-01 (×2): qty 1

## 2012-01-01 MED ORDER — PRIMIDONE 50 MG PO TABS: 25.0000 mg | ORAL_TABLET | Freq: Two times a day (BID) | ORAL | Status: DC

## 2012-01-01 MED ORDER — PANTOPRAZOLE SODIUM 40 MG PO TBEC
40.0000 mg | DELAYED_RELEASE_TABLET | Freq: Every day | ORAL | Status: DC
  Administered 2012-01-02 – 2012-01-03 (×2): 40 mg via ORAL
  Filled 2012-01-01 (×2): qty 1

## 2012-01-01 MED ORDER — ATORVASTATIN CALCIUM 10 MG PO TABS
10.0000 mg | ORAL_TABLET | Freq: Every day | ORAL | Status: DC
  Administered 2012-01-01 – 2012-01-02 (×2): 10 mg via ORAL
  Filled 2012-01-01 (×3): qty 1

## 2012-01-01 MED ORDER — BUMETANIDE 2 MG PO TABS
2.0000 mg | ORAL_TABLET | Freq: Two times a day (BID) | ORAL | Status: DC
  Administered 2012-01-01 – 2012-01-03 (×3): 2 mg via ORAL
  Filled 2012-01-01 (×5): qty 1

## 2012-01-01 MED ORDER — VANCOMYCIN HCL 1000 MG IV SOLR
2500.0000 mg | Freq: Once | INTRAVENOUS | Status: AC
  Administered 2012-01-02: 2500 mg via INTRAVENOUS
  Filled 2012-01-01 (×2): qty 2500

## 2012-01-01 MED ORDER — HYDROCODONE-ACETAMINOPHEN 5-325 MG PO TABS
2.0000 | ORAL_TABLET | ORAL | Status: DC | PRN
  Administered 2012-01-03: 2 via ORAL
  Filled 2012-01-01: qty 2

## 2012-01-01 MED ORDER — ASPIRIN EC 81 MG PO TBEC
81.0000 mg | DELAYED_RELEASE_TABLET | Freq: Every day | ORAL | Status: DC
  Administered 2012-01-02 – 2012-01-03 (×2): 81 mg via ORAL
  Filled 2012-01-01 (×2): qty 1

## 2012-01-01 MED ORDER — IRBESARTAN 150 MG PO TABS
150.0000 mg | ORAL_TABLET | Freq: Every day | ORAL | Status: DC
  Administered 2012-01-02 – 2012-01-03 (×2): 150 mg via ORAL
  Filled 2012-01-01 (×2): qty 1

## 2012-01-01 MED ORDER — BUSPIRONE HCL 15 MG PO TABS
7.5000 mg | ORAL_TABLET | Freq: Two times a day (BID) | ORAL | Status: DC
  Administered 2012-01-01 – 2012-01-03 (×4): 7.5 mg via ORAL
  Filled 2012-01-01 (×5): qty 1

## 2012-01-01 MED ORDER — LIRAGLUTIDE 18 MG/3ML ~~LOC~~ SOLN: 1.8000 mL | Freq: Every morning | SUBCUTANEOUS | Status: DC

## 2012-01-01 MED ORDER — METOLAZONE 5 MG PO TABS
5.0000 mg | ORAL_TABLET | ORAL | Status: DC
  Administered 2012-01-01 – 2012-01-03 (×2): 5 mg via ORAL
  Filled 2012-01-01 (×2): qty 1

## 2012-01-01 MED ORDER — PRIMIDONE 50 MG PO TABS
25.0000 mg | ORAL_TABLET | Freq: Every day | ORAL | Status: DC
Start: 2012-01-02 — End: 2012-01-02
  Filled 2012-01-01: qty 0.5

## 2012-01-01 MED ORDER — INSULIN ASPART 100 UNIT/ML ~~LOC~~ SOLN
0.0000 [IU] | Freq: Three times a day (TID) | SUBCUTANEOUS | Status: DC
  Administered 2012-01-02: 7 [IU] via SUBCUTANEOUS
  Administered 2012-01-02 (×2): 11 [IU] via SUBCUTANEOUS
  Administered 2012-01-03: 7 [IU] via SUBCUTANEOUS
  Administered 2012-01-03: 4 [IU] via SUBCUTANEOUS

## 2012-01-01 MED ORDER — TOPIRAMATE 25 MG PO TABS
50.0000 mg | ORAL_TABLET | Freq: Every day | ORAL | Status: DC
  Filled 2012-01-01: qty 2

## 2012-01-01 MED ORDER — CHOLESTYRAMINE 4 G PO PACK
1.0000 | PACK | Freq: Three times a day (TID) | ORAL | Status: DC
  Administered 2012-01-02: 1 via ORAL
  Filled 2012-01-01 (×8): qty 1

## 2012-01-01 MED ORDER — BUDESONIDE 0.25 MG/2ML IN SUSP
0.2500 mg | Freq: Two times a day (BID) | RESPIRATORY_TRACT | Status: DC
  Filled 2012-01-01 (×6): qty 2

## 2012-01-01 MED ORDER — POTASSIUM CHLORIDE 20 MEQ PO PACK
20.0000 meq | PACK | Freq: Three times a day (TID) | ORAL | Status: DC
  Administered 2012-01-01: 20 meq via ORAL
  Filled 2012-01-01 (×4): qty 1

## 2012-01-01 MED ORDER — PROBIOTIC PO CAPS: 1.0000 | ORAL_CAPSULE | Freq: Every day | ORAL | Status: DC

## 2012-01-01 MED ORDER — POLYETHYLENE GLYCOL 3350 17 G PO PACK
17.0000 g | PACK | Freq: Every day | ORAL | Status: DC | PRN
  Filled 2012-01-01: qty 1

## 2012-01-01 MED ORDER — PRIMIDONE 50 MG PO TABS
75.0000 mg | ORAL_TABLET | Freq: Every day | ORAL | Status: DC
  Administered 2012-01-01: 75 mg via ORAL
  Filled 2012-01-01 (×2): qty 1.5

## 2012-01-01 MED ORDER — ALPRAZOLAM 0.5 MG PO TABS: 0.5000 mg | ORAL_TABLET | Freq: Two times a day (BID) | ORAL | Status: DC | PRN

## 2012-01-01 MED ORDER — SODIUM CHLORIDE 0.9 % IV SOLN
INTRAVENOUS | Status: AC
  Administered 2012-01-02: 10 mL/h via INTRAVENOUS

## 2012-01-01 MED ORDER — OMEGA-3-ACID ETHYL ESTERS 1 G PO CAPS
2.0000 g | ORAL_CAPSULE | Freq: Every day | ORAL | Status: DC
  Filled 2012-01-01: qty 2

## 2012-01-01 MED ORDER — GUAIFENESIN ER 600 MG PO TB12
1200.0000 mg | ORAL_TABLET | Freq: Two times a day (BID) | ORAL | Status: DC
  Administered 2012-01-01: 1200 mg via ORAL
  Filled 2012-01-01 (×3): qty 2

## 2012-01-01 NOTE — ED Provider Notes (Addendum)
History     CSN: [id number]  Arrival date & time 01/01/12  1343   First MD Initiated Contact with Patient 01/01/12 1408      Chief Complaint  Patient presents with  . Fever  . Abscess    (Consider location/radiation/quality/duration/timing/severity/associated sxs/prior treatment) HPI Comments: Pt presents with 3 day hx of abscess to vaginal area.  Has hx of sores in same location, but they usually open on their own.  Says pain has been worsening over last 3 days.  Has had fevers and chills for last 2 days.  No n/v/d.  No cough or chest congestion.  No abd pain.  No other sores or rashes  The history is provided by the patient.    Past Medical History  Diagnosis Date  . OSA (obstructive sleep apnea)   . Morbid obesity   . Depressive disorder, not elsewhere classified   . Diabetes mellitus   . Asthma   . Dyslipidemia   . Clostridium difficile enterocolitis 2012  . Splenomegaly 06/01/2011  . Thrombocytopenia 06/01/2011  . Hepatosplenomegaly   . Gout   . Peripheral neuropathy   . GERD (gastroesophageal reflux disease)   . Osteoarthritis   . Polyneuropathy   . Urinary incontinence   . Amenorrhea     Past Surgical History  Procedure Date  . Cholecystectomy   . Dilation and curettage of uterus   . Tracheostomy   . Endometrial ablation   . Skin graft   . Finger surgery     ring finger on right hand  . Breast biopsy     needle core, left    Family History  Problem Relation Age of Onset  . Diabetes Father   . Heart disease Mother   . Clotting disorder Mother   . Hypertension Mother   . Hypertension Father   . Multiple myeloma Paternal Grandmother   . Heart disease Paternal Grandfather   . Cancer Maternal Grandfather   . Kidney disease Maternal Grandmother   . Hypertension Sister     History  Substance Use Topics  . Smoking status: Never Smoker   . Smokeless tobacco: Never Used  . Alcohol Use: No     occas-once or twice a year    OB History    Grav Para  Term Preterm Abortions TAB SAB Ect Mult Living   0               Review of Systems  Constitutional: Positive for fever, chills and fatigue. Negative for diaphoresis.  HENT: Negative for congestion, rhinorrhea and sneezing.   Eyes: Negative.   Respiratory: Negative for cough, chest tightness and shortness of breath.   Cardiovascular: Negative for chest pain and leg swelling.  Gastrointestinal: Negative for nausea, vomiting, abdominal pain, diarrhea and blood in stool.  Genitourinary: Negative for frequency, hematuria, flank pain and difficulty urinating.  Musculoskeletal: Negative for back pain and arthralgias.  Skin: Positive for wound. Negative for rash.  Neurological: Negative for dizziness, speech difficulty, weakness, numbness and headaches.    Allergies  Molds & smuts; Allopurinol; Amoxicillin-pot clavulanate; Ciprofloxacin; Codeine; Fexofenadine; Metronidazole; Morphine; Sulfa drugs cross reactors; and Sulfonamide derivatives  Home Medications   Current Outpatient Rx  Name Route Sig Dispense Refill  . ACETAMINOPHEN 500 MG PO TABS Oral Take 1,000 mg by mouth as needed. For pain    . ALBUTEROL SULFATE (2.5 MG/3ML) 0.083% IN NEBU Nebulization Take 2.5 mg by nebulization every 4 (four) hours as needed. For shortness of breath    .  ALPRAZOLAM 0.5 MG PO TABS Oral Take 0.5 mg by mouth 2 (two) times daily as needed. For anxiety    . ASPIRIN EC 81 MG PO TBEC Oral Take 81 mg by mouth daily.    . ATORVASTATIN CALCIUM 10 MG PO TABS Oral Take 10 mg by mouth at bedtime.     . B COMPLEX VITAMINS PO Oral Take 1 tablet by mouth daily.    . BUDESONIDE 0.25 MG/2ML IN SUSP Nebulization Take 0.25 mg by nebulization 3 (three) times daily as needed. For shortness of breath.    . BUMETANIDE 1 MG PO TABS Oral Take 2 mg by mouth 2 (two) times daily.     . BUSPIRONE HCL 5 MG PO TABS Oral Take 7.5 mg by mouth 2 (two) times daily. \    . CHOLESTYRAMINE 4 G PO PACK Oral Take 1 packet by mouth as needed.  For loose stools, IBS.    [name] COLCHICINE 0.6 MG PO TABS Oral Take 0.6 mg by mouth daily.      [name] DICYCLOMINE HCL 20 MG PO TABS Oral Take 20 mg by mouth 2 (two) times daily.      . DULOXETINE HCL 30 MG PO CPEP Oral Take 30 mg by mouth 2 (two) times daily.      . ERGOCALCIFEROL 50000 UNITS PO CAPS Oral Take 50,000 Units by mouth once a week. Take on Fridays    . FEBUXOSTAT 40 MG PO TABS Oral Take 80 mg by mouth at bedtime.     . FESOTERODINE FUMARATE ER 4 MG PO TB24 Oral Take 4 mg by mouth daily.      . OMEGA-3 FATTY ACIDS 1000 MG PO CAPS Oral Take 2 g by mouth daily.    [name] GABAPENTIN 100 MG PO CAPS Oral Take 100 mg by mouth 4 (four) times daily.     . GUAIFENESIN ER 600 MG PO TB12 Oral Take 1,200 mg by mouth 2 (two) times daily.      [name] HYDROCODONE-ACETAMINOPHEN 5-500 MG PO TABS Oral Take 1 tablet by mouth every 6 (six) hours as needed. For pain    . INSULIN ASPART 100 UNIT/ML [place] SOLN Subcutaneous Inject 16 Units into the skin 3 (three) times daily before meals.     . INSULIN GLARGINE 100 UNIT/ML [place] SOLN Subcutaneous Inject 70 Units into the skin 2 (two) times daily.     [name] LIRAGLUTIDE 18 MG/3ML [place] SOLN Subcutaneous Inject 1.8 mLs into the skin every morning.    [name] LORATADINE 10 MG PO TABS Oral Take 10 mg by mouth daily as needed. For allegies    . MECLIZINE HCL 25 MG PO TABS Oral Take 25 mg by mouth at bedtime.     [name] METOLAZONE 5 MG PO TABS Oral Take 5 mg by mouth See admin instructions. Take on Mondays, Wednesdays, Fridays.  Take on Sundays, Tuesdays, Thursdays, Saturdays.  Patient rotates weeks; some weeks take 3 times per week and some weeks take 4 times per week.    . MULTIVITAMINS PO CAPS Oral Take 1 capsule by mouth daily.      . NEBIVOLOL HCL 10 MG PO TABS Oral Take 10 mg by mouth daily.      [name] OMEPRAZOLE 40 MG PO CPDR Oral Take 40 mg by mouth daily.      [name] POLYETHYLENE GLYCOL 3350 PO PACK Oral Take 17 g by mouth daily as needed. For constipation.    [name] POTASSIUM CHLORIDE 20 MEQ PO PACK Oral  Take  20 mEq by mouth 3 (three) times daily.      [name] PRIMIDONE 50 MG PO TABS Oral Take 25-75 mg by mouth 2 (two) times daily. Take 25 MG in the morning and 75 MG at bedtime.    [name] PROBIOTIC PO CAPS Oral Take 1 capsule by mouth daily.     [name] PROMETHAZINE HCL 25 MG PO TABS Oral Take 25 mg by mouth every 6 (six) hours as needed. Nausea and vomiting    . SIMETHICONE 180 MG PO CAPS Oral Take 1 capsule by mouth daily as needed. For gas    . TOPIRAMATE 50 MG PO TABS Oral Take 50 mg by mouth daily.      [name] VALSARTAN 160 MG PO TABS Oral Take 160 mg by mouth daily.      . WARFARIN SODIUM 7.5 MG PO TABS Oral Take 5-7.5 mg by mouth daily. As directed. Take the 5mg  mon, tues, thurs, and fridays, and 7.5mg  rest of days      BP 143/79  Pulse 90  Temp 99.3 F (37.4 C) (Oral)  Resp 22  SpO2 99%  Physical Exam  Constitutional: She is oriented to person, place, and time. She appears well-developed and well-nourished.       Morbidly obese  HENT:  Head: Normocephalic and atraumatic.  Eyes: Pupils are equal, round, and reactive to light.  Neck: Normal range of motion. Neck supple.       Trach in place  Cardiovascular: Normal rate, regular rhythm and normal heart sounds.   Pulmonary/Chest: Effort normal and breath sounds normal. No respiratory distress. She has no wheezes. She has no rales. She exhibits no tenderness.  Abdominal: Soft. Bowel sounds are normal. There is no tenderness. There is no rebound and no guarding.       Pt with double panus.  Under 2nd panus is 2cm fluctuant abscess with surrounding erythema  Musculoskeletal: Normal range of motion. She exhibits no edema.  Lymphadenopathy:    She has no cervical adenopathy.  Neurological: She is alert and oriented to person, place, and time.  Skin: Skin is warm and dry. No rash noted.  Psychiatric: She has a normal mood and affect.    ED Course  INCISION AND DRAINAGE Date/Time: 01/01/2012 6:44 PM Performed by: Sharlynn Seckinger Authorized by: Rolan Bucco Consent: Verbal consent obtained. Consent given by: patient Type: abscess Anesthesia: local infiltration Local anesthetic: lidocaine 1% without epinephrine Anesthetic total: 2 ml Scalpel size: 11 Incision type: single straight Complexity: simple Drainage: purulent Drainage amount: copious Wound treatment: wound left open Packing material: 1/4 in gauze Patient tolerance: Patient tolerated the procedure well with no immediate complications.   (including critical care time) Results for orders placed during the hospital encounter of 01/01/12  CBC WITH DIFFERENTIAL      Component Value Range   WBC 10.0  4.0 - 10.5 K/uL   RBC 3.82 (*) 3.87 - 5.11 MIL/uL   Hemoglobin 12.0  12.0 - 15.0 g/dL   HCT 11.9  14.7 - 82.9 %   MCV 94.8  78.0 - 100.0 fL   MCH 31.4  26.0 - 34.0 pg   MCHC 33.1  30.0 - 36.0 g/dL   RDW 56.2  13.0 - 86.5 %   Platelets 126 (*) 150 - 400 K/uL   Neutrophils Relative 74  43 - 77 %   Neutro Abs 7.4  1.7 - 7.7 K/uL   Lymphocytes Relative 13  12 - 46 %   Lymphs Abs 1.3  0.7 - 4.0 K/uL   Monocytes Relative 13 (*) 3 - 12 %   Monocytes Absolute 1.3 (*) 0.1 - 1.0 K/uL   Eosinophils Relative 0  0 - 5 %   Eosinophils Absolute 0.0  0.0 - 0.7 K/uL   Basophils Relative 0  0 - 1 %   Basophils Absolute 0.0  0.0 - 0.1 K/uL  COMPREHENSIVE METABOLIC PANEL      Component Value Range   Sodium 137  135 - 145 mEq/L   Potassium 3.8  3.5 - 5.1 mEq/L   Chloride 92 (*) 96 - 112 mEq/L   CO2 35 (*) 19 - 32 mEq/L   Glucose, Bld 282 (*) 70 - 99 mg/dL   BUN 19  6 - 23 mg/dL   Creatinine, Ser 6.96  0.50 - 1.10 mg/dL   Calcium 8.9  8.4 - 29.5 mg/dL   Total Protein 8.1  6.0 - 8.3 g/dL   Albumin 3.4 (*) 3.5 - 5.2 g/dL   AST 28  0 - 37 U/L   ALT 26  0 - 35 U/L   Alkaline Phosphatase 56  39 - 117 U/L   Total Bilirubin 0.5  0.3 - 1.2 mg/dL   GFR calc non Af Amer 78 (*) >90 mL/min   GFR calc Af Amer >90  >90 mL/min  URINALYSIS, ROUTINE W REFLEX MICROSCOPIC      Component Value  Range   Color, Urine AMBER (*) YELLOW   APPearance CLEAR  CLEAR   Specific Gravity, Urine 1.019  1.005 - 1.030   pH 6.0  5.0 - 8.0   Glucose, UA NEGATIVE  NEGATIVE mg/dL   Hgb urine dipstick NEGATIVE  NEGATIVE   Bilirubin Urine NEGATIVE  NEGATIVE   Ketones, ur NEGATIVE  NEGATIVE mg/dL   Protein, ur NEGATIVE  NEGATIVE mg/dL   Urobilinogen, UA 0.2  0.0 - 1.0 mg/dL   Nitrite NEGATIVE  NEGATIVE   Leukocytes, UA TRACE (*) NEGATIVE  URINE MICROSCOPIC-ADD ON      Component Value Range   Squamous Epithelial / LPF RARE  RARE   WBC, UA 0-2  <3 WBC/hpf   Dg Chest Port 1 View  01/01/2012  *RADIOLOGY REPORT*  Clinical Data:3-day history of fever, chills, and cough.  Chronic ventilator dependent respiratory failure.  PORTABLE CHEST - 1 VIEW 01/01/2012 1435 hours:  Comparison: Two-view chest x-ray 02/16/2010 and portable chest x- ray 02/14/2009.  Findings: Tracheostomy tube tip in satisfactory position below the thoracic inlet.  Suboptimal inspiration due to body habitus which accounts for crowded bronchovascular markings at the bases and accentuates the cardiac silhouette.  Taking this into account, cardiac silhouette mildly enlarged but stable and lungs clear.  IMPRESSION: Suboptimal inspiration.  No acute cardiopulmonary disease.  Original Report Authenticated By: Arnell Sieving, [name]    Date: 01/01/2012  Rate: 94  Rhythm: normal sinus rhythm  QRS Axis: normal  Intervals: normal  ST/T Wave abnormalities: nonspecific ST/T changes  Conduction Disutrbances:none  Narrative Interpretation:   Old EKG Reviewed: unchanged     1. Cellulitis       MDM  absces was drained, was larger than initially visualized.  Given area and pt's DM and obesity.  Started on abx.  Discussed with [facility] who will admit pt.  Will also consult surgery per hospitalist request        Rolan Bucco, MD 01/01/12 Billy Fischer, MD 01/01/12 2004

## 2012-01-01 NOTE — Progress Notes (Signed)
ANTIBIOTIC CONSULT NOTE - INITIAL  Pharmacy Consult for Vancomycin/Azactam/Coumadin Indication: abscess beneath pannus/ h/o DVT  Allergies  Allergen Reactions  . Molds & Smuts Shortness Of Breath and Rash  . Allopurinol     GI  . Amoxicillin-Pot Clavulanate     unknown  . Ciprofloxacin     GI problems  . Codeine     Heart problems  . Fexofenadine     Hurts joints  . Metronidazole     unknown  . Morphine     unknown  . Sulfa Drugs Cross Reactors Nausea And Vomiting    nausea  . Sulfonamide Derivatives     nausea    Patient Measurements: Height: 5' 8.11" (173 cm) Weight: 426 lb 13 oz (193.6 kg) IBW/kg (Calculated) : 64.15   Vital Signs: Temp: 99.3 F (37.4 C) (07/09 1347) Temp src: Oral (07/09 1347) BP: 111/52 mmHg (07/09 2045) Pulse Rate: 93  (07/09 2045) Intake/Output from previous day:   Intake/Output from this shift:    Labs:  Basename 01/01/12 1426  WBC 10.0  HGB 12.0  PLT 126*  LABCREA --  CREATININE 0.86   Estimated Creatinine Clearance: 144.9 ml/min (by C-G formula based on Cr of 0.86). No results found for this basename: VANCOTROUGH:2,VANCOPEAK:2,VANCORANDOM:2,GENTTROUGH:2,GENTPEAK:2,GENTRANDOM:2,TOBRATROUGH:2,TOBRAPEAK:2,TOBRARND:2,AMIKACINPEAK:2,AMIKACINTROU:2,AMIKACIN:2, in the last 72 hours   Microbiology: No results found for this or any previous visit (from the past 720 hour(s)).  Medical History: Past Medical History  Diagnosis Date  . OSA (obstructive sleep apnea)   . Morbid obesity   . Depressive disorder, not elsewhere classified   . Diabetes mellitus   . Asthma   . Dyslipidemia   . Clostridium difficile enterocolitis 2012  . Splenomegaly 06/01/2011  . Thrombocytopenia 06/01/2011  . Hepatosplenomegaly   . Gout   . Peripheral neuropathy   . GERD (gastroesophageal reflux disease)   . Osteoarthritis   . Polyneuropathy   . Urinary incontinence   . Amenorrhea     Medications:   (Not in a hospital admission) Assessment: 51  y/o female patient admitted with groin pain and fever, found to have abscess beneath pannus requiring I&D with broad spectrum antibiotics. On chronic coumadin for h/o DVT, INR pending. Patient states plan was to be off coumadin for next week d/t trach replacement. Will contact MD.  Goal of Therapy:  Vancomycin trough level 10-15 mcg/ml  Plan:  Vancomycin 2500mg  IV now then 1500mg  IV q12h, azactam 2g iv q8h. Will monitor renal function and follow INR with possibly transitioning to lovenox. Measure antibiotic drug levels at steady [address] PharmD, [place] Pager [phone] 01/01/2012,9:04 PM

## 2012-01-01 NOTE — H&P (Signed)
PCP:   Astrid Divine, MD   Chief Complaint:  Fever, abscess in groin area.  HPI: [name] has an extensive medical history including morbid obesity. She comes in with 3 day hx of fever and pain in the groin area. She was found to have abscess formation around the vaginal area. Dr Fredderick Phenix performed I$D but referred patient to hospitalist service for IV abx as patient seems to also have cellulitis, and is at high risk of complications because of her comobidities. She says she feels generally bad, and has had fever. Dr Fredderick Phenix has also consulted surgery to help direct care for the abscess in house. It is conceivable patient may require further I and D. Appreciate surgical assistance.  Review of Systems:  Unremarkable except in the hpi.  Past Medical History: Past Medical History  Diagnosis Date  . OSA (obstructive sleep apnea)   . Morbid obesity   . Depressive disorder, not elsewhere classified   . Diabetes mellitus   . Asthma   . Dyslipidemia   . Clostridium difficile enterocolitis 2012  . Splenomegaly 06/01/2011  . Thrombocytopenia 06/01/2011  . Hepatosplenomegaly   . Gout   . Peripheral neuropathy   . GERD (gastroesophageal reflux disease)   . Osteoarthritis   . Polyneuropathy   . Urinary incontinence   . Amenorrhea    Past Surgical History  Procedure Date  . Cholecystectomy   . Dilation and curettage of uterus   . Tracheostomy   . Endometrial ablation   . Skin graft   . Finger surgery     ring finger on right hand  . Breast biopsy     needle core, left    Medications: Prior to Admission medications   Medication Sig Start Date End Date Taking? Authorizing Provider  acetaminophen (TYLENOL) 500 MG tablet Take 1,000 mg by mouth as needed. For pain   Yes Historical Provider, MD  albuterol (PROVENTIL) (2.5 MG/3ML) 0.083% nebulizer solution Take 2.5 mg by nebulization every 4 (four) hours as needed. For shortness of breath 05/28/11  Yes Coralyn Helling, MD  ALPRAZolam  Prudy Feeler) 0.5 MG tablet Take 0.5 mg by mouth 2 (two) times daily as needed. For anxiety   Yes Historical Provider, MD  aspirin EC 81 MG tablet Take 81 mg by mouth daily.   Yes Historical Provider, MD  atorvastatin (LIPITOR) 10 MG tablet Take 10 mg by mouth at bedtime.    Yes Historical Provider, MD  B COMPLEX VITAMINS PO Take 1 tablet by mouth daily.   Yes Historical Provider, MD  budesonide (PULMICORT) 0.25 MG/2ML nebulizer solution Take 0.25 mg by nebulization 3 (three) times daily as needed. For shortness of breath. 05/28/11  Yes Coralyn Helling, MD  bumetanide (BUMEX) 1 MG tablet Take 2 mg by mouth 2 (two) times daily.    Yes Historical Provider, MD  busPIRone (BUSPAR) 5 MG tablet Take 7.5 mg by mouth 2 (two) times daily. \   Yes Historical Provider, MD  cholestyramine Lanetta Inch) 4 G packet Take 1 packet by mouth as needed. For loose stools, IBS.   Yes Historical Provider, MD  colchicine (COLCRYS) 0.6 MG tablet Take 0.6 mg by mouth daily.     Yes Historical Provider, MD  dicyclomine (BENTYL) 20 MG tablet Take 20 mg by mouth 2 (two) times daily.     Yes Historical Provider, MD  DULoxetine (CYMBALTA) 30 MG capsule Take 30 mg by mouth 2 (two) times daily.     Yes Historical Provider, MD  ergocalciferol (VITAMIN D2)  50000 UNITS capsule Take 50,000 Units by mouth once a week. Take on Fridays   Yes Historical Provider, MD  febuxostat (ULORIC) 40 MG tablet Take 80 mg by mouth at bedtime.    Yes Historical Provider, MD  fesoterodine (TOVIAZ) 4 MG TB24 Take 4 mg by mouth daily.     Yes Historical Provider, MD  fish oil-omega-3 fatty acids 1000 MG capsule Take 2 g by mouth daily.   Yes Historical Provider, MD  gabapentin (NEURONTIN) 100 MG capsule Take 100 mg by mouth 4 (four) times daily.    Yes Historical Provider, MD  guaiFENesin (MUCINEX) 600 MG 12 hr tablet Take 1,200 mg by mouth 2 (two) times daily.     Yes Historical Provider, MD  HYDROcodone-acetaminophen (VICODIN) 5-500 MG per tablet Take 1 tablet by  mouth every 6 (six) hours as needed. For pain   Yes Historical Provider, MD  insulin aspart (NOVOLOG) 100 UNIT/ML injection Inject 16 Units into the skin 3 (three) times daily before meals.    Yes Historical Provider, MD  insulin glargine (LANTUS) 100 UNIT/ML injection Inject 70 Units into the skin 2 (two) times daily.    Yes Historical Provider, MD  Liraglutide (VICTOZA) 18 MG/3ML SOLN Inject 1.8 mLs into the skin every morning.   Yes Historical Provider, MD  loratadine (CLARITIN) 10 MG tablet Take 10 mg by mouth daily as needed. For allegies   Yes Historical Provider, MD  meclizine (ANTIVERT) 25 MG tablet Take 25 mg by mouth at bedtime.    Yes Historical Provider, MD  metolazone (ZAROXOLYN) 5 MG tablet Take 5 mg by mouth See admin instructions. Take on Mondays, Wednesdays, Fridays.  Take on Sundays, Tuesdays, Thursdays, Saturdays.  Patient rotates weeks; some weeks take 3 times per week and some weeks take 4 times per week.   Yes Historical Provider, MD  Multiple Vitamin (MULTIVITAMIN) capsule Take 1 capsule by mouth daily.     Yes Historical Provider, MD  nebivolol (BYSTOLIC) 10 MG tablet Take 10 mg by mouth daily.     Yes Historical Provider, MD  omeprazole (PRILOSEC) 40 MG capsule Take 40 mg by mouth daily.     Yes Historical Provider, MD  polyethylene glycol (MIRALAX / GLYCOLAX) packet Take 17 g by mouth daily as needed. For constipation.   Yes Historical Provider, MD  potassium chloride (KLOR-CON) 20 MEQ packet Take 20 mEq by mouth 3 (three) times daily.     Yes Historical Provider, MD  primidone (MYSOLINE) 50 MG tablet Take 25-75 mg by mouth 2 (two) times daily. Take 25 MG in the morning and 75 MG at bedtime.   Yes Historical Provider, MD  PROBIOTIC CAPS Take 1 capsule by mouth daily.    Yes Historical Provider, MD  promethazine (PHENERGAN) 25 MG tablet Take 25 mg by mouth every 6 (six) hours as needed. Nausea and vomiting   Yes Historical Provider, MD  Simethicone 180 MG CAPS Take 1 capsule  by mouth daily as needed. For gas   Yes Historical Provider, MD  topiramate (TOPAMAX) 50 MG tablet Take 50 mg by mouth daily.     Yes Historical Provider, MD  valsartan (DIOVAN) 160 MG tablet Take 160 mg by mouth daily.     Yes Historical Provider, MD  warfarin (COUMADIN) 7.5 MG tablet Take 5-7.5 mg by mouth daily. As directed. Take the 5mg  mon, tues, thurs, and fridays, and 7.5mg  rest of days   Yes Historical Provider, MD    Allergies:   Allergies  Allergen Reactions  . Molds & Smuts Shortness Of Breath and Rash  . Allopurinol     GI  . Amoxicillin-Pot Clavulanate     unknown  . Ciprofloxacin     GI problems  . Codeine     Heart problems  . Fexofenadine     Hurts joints  . Metronidazole     unknown  . Morphine     unknown  . Sulfa Drugs Cross Reactors Nausea And Vomiting    nausea  . Sulfonamide Derivatives     nausea    Social History:  reports that she has never smoked. She has never used smokeless tobacco. She reports that she does not drink alcohol or use illicit drugs. lives alone but has caregivers 7 days a week.  Family History: Family History  Problem Relation Age of Onset  . Diabetes Father   . Heart disease Mother   . Clotting disorder Mother   . Hypertension Mother   . Hypertension Father   . Multiple myeloma Paternal Grandmother   . Heart disease Paternal Grandfather   . Cancer Maternal Grandfather   . Kidney disease Maternal Grandmother   . Hypertension Sister     Physical Exam: Filed Vitals:   01/01/12 1347  BP: 143/79  Pulse: 90  Temp: 99.3 F (37.4 C)  TempSrc: Oral  Resp: 22  SpO2: 99%   Morbidly obese. Has tracheostomy in place. Lungs clear. S1S2 heard. No murmurs. RRR. Obese abdomen, some induration in abdominal fold. Dressing of wound post drainage in groin area. CNS- grossly Intact. Extremities- no pedal edema.   Labs on Admission:   [facility] 01/01/12 1426  NA 137  K 3.8  CL 92*  CO2 35*  GLUCOSE 282*  BUN 19  CREATININE  0.86  CALCIUM 8.9  MG --  PHOS --    Basename 01/01/12 1426  AST 28  ALT 26  ALKPHOS 56  BILITOT 0.5  PROT 8.1  ALBUMIN 3.4*   No results found for this basename: LIPASE:2,AMYLASE:2 in the last 72 hours  Basename 01/01/12 1426  WBC 10.0  NEUTROABS 7.4  HGB 12.0  HCT 36.2  MCV 94.8  PLT 126*   No results found for this basename: CKTOTAL:3,CKMB:3,CKMBINDEX:3,TROPONINI:3 in the last 72 hours No results found for this basename: TSH,T4TOTAL,FREET3,T3FREE,THYROIDAB in the last 72 hours No results found for this basename: VITAMINB12:2,FOLATE:2,FERRITIN:2,TIBC:2,IRON:2,RETICCTPCT:2 in the last 72 hours  Radiological Exams on Admission: Dg Chest Port 1 View  01/01/2012  *RADIOLOGY REPORT*  Clinical Data:3-day history of fever, chills, and cough.  Chronic ventilator dependent respiratory failure.  PORTABLE CHEST - 1 VIEW 01/01/2012 1435 hours:  Comparison: Two-view chest x-ray 02/16/2010 and portable chest x- ray 02/14/2009.  Findings: Tracheostomy tube tip in satisfactory position below the thoracic inlet.  Suboptimal inspiration due to body habitus which accounts for crowded bronchovascular markings at the bases and accentuates the cardiac silhouette.  Taking this into account, cardiac silhouette mildly enlarged but stable and lungs clear.  IMPRESSION: Suboptimal inspiration.  No acute cardiopulmonary disease.  Original Report Authenticated By: Arnell Sieving, [name]    Assessment  Groin abscess in morbidly obese female with DM2/oSA s/p tracheostomy. Concern for resistant organisms.  Plan  .Abscess and cellulitis- Admit for IV abx(clindamycin/vanc/azactam). Follow wound culture. Appreciate surgical help. .Diabetes mellitus type 2 in obese- hba1c. Lantus/aspart. .Obesity (BMI 35.0-39.9 without comorbidity) .OSA on CPAP, s/p tracheostomy- continue as at home. Marland KitchenHTN (hypertension), benign- resume home meds .GERD (gastroesophageal reflux disease)-ppi .Long term coumadin per pharmacy  please, target inr 2-3.  Condition guarded.  Semaja Lymon [phone]. 01/01/2012, 8:07 PM

## 2012-01-01 NOTE — Consult Note (Signed)
Reason for Consult:Abscess beneath pannus Referring Physician: Shaquayla Klimas is an 51 y.o. female.  HPI: The patient has a history of morbid obesity. She developed pain beneath her pannus above her vagina over the past couple days. She has not had any drainage. The pain gradually worsened and she had difficulty sitting. She came to the emergency department for evaluation. Emergency department physician noted a abscess beneath her pannus above her mons. Emergency department physician performed incision and drainage and placed some packing. The patient is being admitted to the medical service for IV antibiotics and treatment of her medical problems. We are asked to see her in consultation.  Of note, the patient is scheduled in the near future for dilatation of her tracheostomy site by Dr. Jearld Fenton do to some stenosis.  Past Medical History  Diagnosis Date  . OSA (obstructive sleep apnea)   . Morbid obesity   . Depressive disorder, not elsewhere classified   . Diabetes mellitus   . Asthma   . Dyslipidemia   . Clostridium difficile enterocolitis 2012  . Splenomegaly 06/01/2011  . Thrombocytopenia 06/01/2011  . Hepatosplenomegaly   . Gout   . Peripheral neuropathy   . GERD (gastroesophageal reflux disease)   . Osteoarthritis   . Polyneuropathy   . Urinary incontinence   . Amenorrhea     Past Surgical History  Procedure Date  . Cholecystectomy   . Dilation and curettage of uterus   . Tracheostomy   . Endometrial ablation   . Skin graft   . Finger surgery     ring finger on right hand  . Breast biopsy     needle core, left    Family History  Problem Relation Age of Onset  . Diabetes Father   . Heart disease Mother   . Clotting disorder Mother   . Hypertension Mother   . Hypertension Father   . Multiple myeloma Paternal Grandmother   . Heart disease Paternal Grandfather   . Cancer Maternal Grandfather   . Kidney disease Maternal Grandmother   . Hypertension Sister      Social History:  reports that she has never smoked. She has never used smokeless tobacco. She reports that she does not drink alcohol or use illicit drugs.  Allergies:  Allergies  Allergen Reactions  . Molds & Smuts Shortness Of Breath and Rash  . Allopurinol     GI  . Amoxicillin-Pot Clavulanate     unknown  . Ciprofloxacin     GI problems  . Codeine     Heart problems  . Fexofenadine     Hurts joints  . Metronidazole     unknown  . Morphine     unknown  . Sulfa Drugs Cross Reactors Nausea And Vomiting    nausea  . Sulfonamide Derivatives     nausea    Medications: I have reviewed the patient's current medications.  Results for orders placed during the hospital encounter of 01/01/12 (from the past 48 hour(s))  CBC WITH DIFFERENTIAL     Status: Abnormal   Collection Time   01/01/12  2:26 PM      Component Value Range Comment   WBC 10.0  4.0 - 10.5 K/uL    RBC 3.82 (*) 3.87 - 5.11 MIL/uL    Hemoglobin 12.0  12.0 - 15.0 g/dL    HCT 96.0  45.4 - 09.8 %    MCV 94.8  78.0 - 100.0 fL    MCH 31.4  26.0 -  34.0 pg    MCHC 33.1  30.0 - 36.0 g/dL    RDW 16.1  09.6 - 04.5 %    Platelets 126 (*) 150 - 400 K/uL    Neutrophils Relative 74  43 - 77 %    Neutro Abs 7.4  1.7 - 7.7 K/uL    Lymphocytes Relative 13  12 - 46 %    Lymphs Abs 1.3  0.7 - 4.0 K/uL    Monocytes Relative 13 (*) 3 - 12 %    Monocytes Absolute 1.3 (*) 0.1 - 1.0 K/uL    Eosinophils Relative 0  0 - 5 %    Eosinophils Absolute 0.0  0.0 - 0.7 K/uL    Basophils Relative 0  0 - 1 %    Basophils Absolute 0.0  0.0 - 0.1 K/uL   COMPREHENSIVE METABOLIC PANEL     Status: Abnormal   Collection Time   01/01/12  2:26 PM      Component Value Range Comment   Sodium 137  135 - 145 mEq/L    Potassium 3.8  3.5 - 5.1 mEq/L    Chloride 92 (*) 96 - 112 mEq/L    CO2 35 (*) 19 - 32 mEq/L    Glucose, Bld 282 (*) 70 - 99 mg/dL    BUN 19  6 - 23 mg/dL    Creatinine, Ser 4.09  0.50 - 1.10 mg/dL    Calcium 8.9  8.4 - 81.1  mg/dL    Total Protein 8.1  6.0 - 8.3 g/dL    Albumin 3.4 (*) 3.5 - 5.2 g/dL    AST 28  0 - 37 U/L    ALT 26  0 - 35 U/L    Alkaline Phosphatase 56  39 - 117 U/L    Total Bilirubin 0.5  0.3 - 1.2 mg/dL    GFR calc non Af Amer 78 (*) >90 mL/min    GFR calc Af Amer >90  >90 mL/min   URINALYSIS, ROUTINE W REFLEX MICROSCOPIC     Status: Abnormal   Collection Time   01/01/12  4:07 PM      Component Value Range Comment   Color, Urine AMBER (*) YELLOW BIOCHEMICALS MAY BE AFFECTED BY COLOR   APPearance CLEAR  CLEAR    Specific Gravity, Urine 1.019  1.005 - 1.030    pH 6.0  5.0 - 8.0    Glucose, UA NEGATIVE  NEGATIVE mg/dL    Hgb urine dipstick NEGATIVE  NEGATIVE    Bilirubin Urine NEGATIVE  NEGATIVE    Ketones, ur NEGATIVE  NEGATIVE mg/dL    Protein, ur NEGATIVE  NEGATIVE mg/dL    Urobilinogen, UA 0.2  0.0 - 1.0 mg/dL    Nitrite NEGATIVE  NEGATIVE    Leukocytes, UA TRACE (*) NEGATIVE   URINE MICROSCOPIC-ADD ON     Status: Normal   Collection Time   01/01/12  4:07 PM      Component Value Range Comment   Squamous Epithelial / LPF RARE  RARE    WBC, UA 0-2  <3 WBC/hpf   GLUCOSE, CAPILLARY     Status: Abnormal   Collection Time   01/01/12  8:08 PM      Component Value Range Comment   Glucose-Capillary 231 (*) 70 - 99 mg/dL    Comment 1 Notify RN       Dg Chest Port 1 View  01/01/2012  *RADIOLOGY REPORT*  Clinical Data:3-day history of fever, chills, and cough.  Chronic ventilator dependent respiratory failure.  PORTABLE CHEST - 1 VIEW 01/01/2012 1435 hours:  Comparison: Two-view chest x-ray 02/16/2010 and portable chest x- ray 02/14/2009.  Findings: Tracheostomy tube tip in satisfactory position below the thoracic inlet.  Suboptimal inspiration due to body habitus which accounts for crowded bronchovascular markings at the bases and accentuates the cardiac silhouette.  Taking this into account, cardiac silhouette mildly enlarged but stable and lungs clear.  IMPRESSION: Suboptimal inspiration.  No  acute cardiopulmonary disease.  Original Report Authenticated By: Arnell Sieving, [name]    Review of Systems  Constitutional: Positive for fever and malaise/fatigue.  HENT:       Tracheostomy site stenosis  Eyes: Negative.   Respiratory: Negative.   Cardiovascular: Negative.   Gastrointestinal: Negative.   Genitourinary: Negative.   Musculoskeletal:       Pain with sitting due to the abscess  Skin:       See history of present illness  Neurological: Negative.   Endo/Heme/Allergies: Negative.    Blood pressure 143/79, pulse 90, temperature 99.3 F (37.4 C), temperature source Oral, resp. rate 22, SpO2 99.00%. Physical Exam  Constitutional: She is oriented to person, place, and time. No distress.  Neck:       Tracheostomy in place  Cardiovascular: Normal rate and regular rhythm.        Heart sounds distant  Respiratory: Effort normal and breath sounds normal. No respiratory distress.  GI: Soft. She exhibits no distension. There is tenderness. There is no rebound.       Tenderness is located in the area beneath her pannus above her mons. There is an incision and drainage site present. It is draining cloudy old blood material. There is some surrounding edema and erythema. There is no other adjacent area of undrained fluctuance.  Neurological: She is alert and oriented to person, place, and time.  Skin:       See above    Assessment/Plan: Abscess above the mons beneath the pannus in patient with morbid obesity. This has been drained by the emergency department physician. It is draining very well. Agree with admission to the medical service for intravenous antibiotics and wound care. Further drainage may be required if things do not improve so we will follow closely. I discussed the plan in detail with the patient and answered her questions.  Chante Mayson E 01/01/2012, 8:13 PM

## 2012-01-01 NOTE — ED Notes (Signed)
Pt arrived by [facility], reports fevers and chills x 2 days, also reports having boils to vaginal area.

## 2012-01-02 ENCOUNTER — Ambulatory Visit: Payer: Self-pay | Admitting: Pulmonary Disease

## 2012-01-02 DIAGNOSIS — E119 Type 2 diabetes mellitus without complications: Secondary | ICD-10-CM

## 2012-01-02 DIAGNOSIS — G4733 Obstructive sleep apnea (adult) (pediatric): Secondary | ICD-10-CM

## 2012-01-02 DIAGNOSIS — E669 Obesity, unspecified: Secondary | ICD-10-CM

## 2012-01-02 DIAGNOSIS — L03319 Cellulitis of trunk, unspecified: Secondary | ICD-10-CM

## 2012-01-02 DIAGNOSIS — L02219 Cutaneous abscess of trunk, unspecified: Secondary | ICD-10-CM

## 2012-01-02 DIAGNOSIS — Z7901 Long term (current) use of anticoagulants: Secondary | ICD-10-CM

## 2012-01-02 LAB — CBC
HCT: 33.4 % — ABNORMAL LOW (ref 36.0–46.0)
MCV: 94.1 fL (ref 78.0–100.0)
RBC: 3.55 MIL/uL — ABNORMAL LOW (ref 3.87–5.11)
RDW: 15.1 % (ref 11.5–15.5)
WBC: 7.6 10*3/uL (ref 4.0–10.5)

## 2012-01-02 LAB — GLUCOSE, CAPILLARY: Glucose-Capillary: 232 mg/dL — ABNORMAL HIGH (ref 70–99)

## 2012-01-02 LAB — APTT: aPTT: 48 seconds — ABNORMAL HIGH (ref 24–37)

## 2012-01-02 LAB — COMPREHENSIVE METABOLIC PANEL
Albumin: 3.1 g/dL — ABNORMAL LOW (ref 3.5–5.2)
Alkaline Phosphatase: 49 U/L (ref 39–117)
BUN: 22 mg/dL (ref 6–23)
Potassium: 2.5 mEq/L — CL (ref 3.5–5.1)
Total Protein: 7.5 g/dL (ref 6.0–8.3)

## 2012-01-02 LAB — MRSA PCR SCREENING: MRSA by PCR: POSITIVE — AB

## 2012-01-02 MED ORDER — WARFARIN - PHARMACIST DOSING INPATIENT: Freq: Every day | Status: DC

## 2012-01-02 MED ORDER — CHLORHEXIDINE GLUCONATE CLOTH 2 % EX PADS
6.0000 | MEDICATED_PAD | Freq: Every day | CUTANEOUS | Status: DC
  Administered 2012-01-02 – 2012-01-03 (×2): 6 via TOPICAL

## 2012-01-02 MED ORDER — DEXTROSE 5 % IV SOLN
2.0000 g | Freq: Three times a day (TID) | INTRAVENOUS | Status: DC
  Filled 2012-01-02 (×2): qty 2

## 2012-01-02 MED ORDER — WARFARIN SODIUM 7.5 MG PO TABS
7.5000 mg | ORAL_TABLET | Freq: Once | ORAL | Status: AC
  Administered 2012-01-02: 7.5 mg via ORAL
  Filled 2012-01-02: qty 1

## 2012-01-02 MED ORDER — GUAIFENESIN ER 600 MG PO TB12
1200.0000 mg | ORAL_TABLET | Freq: Two times a day (BID) | ORAL | Status: DC
  Administered 2012-01-02 – 2012-01-03 (×3): 1200 mg via ORAL
  Filled 2012-01-02 (×5): qty 2

## 2012-01-02 MED ORDER — METRONIDAZOLE 500 MG PO TABS
500.0000 mg | ORAL_TABLET | Freq: Three times a day (TID) | ORAL | Status: DC
  Filled 2012-01-02 (×3): qty 1

## 2012-01-02 MED ORDER — PRIMIDONE 50 MG PO TABS
25.0000 mg | ORAL_TABLET | Freq: Every day | ORAL | Status: DC
  Administered 2012-01-03: 25 mg via ORAL
  Filled 2012-01-02: qty 0.5

## 2012-01-02 MED ORDER — POTASSIUM CHLORIDE CRYS ER 20 MEQ PO TBCR
40.0000 meq | EXTENDED_RELEASE_TABLET | Freq: Three times a day (TID) | ORAL | Status: AC
  Administered 2012-01-02 (×3): 40 meq via ORAL
  Filled 2012-01-02 (×2): qty 2

## 2012-01-02 MED ORDER — POTASSIUM CHLORIDE CRYS ER 20 MEQ PO TBCR
20.0000 meq | EXTENDED_RELEASE_TABLET | Freq: Three times a day (TID) | ORAL | Status: DC
  Administered 2012-01-03: 20 meq via ORAL
  Filled 2012-01-02 (×5): qty 1

## 2012-01-02 MED ORDER — INSULIN GLARGINE 100 UNIT/ML ~~LOC~~ SOLN
85.0000 [IU] | Freq: Two times a day (BID) | SUBCUTANEOUS | Status: DC
  Administered 2012-01-02 – 2012-01-03 (×3): 85 [IU] via SUBCUTANEOUS

## 2012-01-02 MED ORDER — TOPIRAMATE 25 MG PO TABS
25.0000 mg | ORAL_TABLET | Freq: Every day | ORAL | Status: DC
  Administered 2012-01-03: 25 mg via ORAL
  Filled 2012-01-02: qty 1

## 2012-01-02 MED ORDER — CLINDAMYCIN HCL 300 MG PO CAPS
300.0000 mg | ORAL_CAPSULE | Freq: Three times a day (TID) | ORAL | Status: DC
  Administered 2012-01-02 – 2012-01-03 (×3): 300 mg via ORAL
  Filled 2012-01-02 (×6): qty 1

## 2012-01-02 MED ORDER — POTASSIUM CHLORIDE CRYS ER 20 MEQ PO TBCR: 20.0000 meq | EXTENDED_RELEASE_TABLET | Freq: Three times a day (TID) | ORAL | Status: DC

## 2012-01-02 MED ORDER — MUPIROCIN 2 % EX OINT
1.0000 "application " | TOPICAL_OINTMENT | Freq: Two times a day (BID) | CUTANEOUS | Status: DC
  Administered 2012-01-02 – 2012-01-03 (×2): 1 via NASAL
  Filled 2012-01-02: qty 22

## 2012-01-02 MED ORDER — PRIMIDONE 50 MG PO TABS
75.0000 mg | ORAL_TABLET | Freq: Every day | ORAL | Status: DC
  Administered 2012-01-02: 75 mg via ORAL
  Filled 2012-01-02 (×2): qty 1.5

## 2012-01-02 MED ORDER — ALBUTEROL SULFATE (5 MG/ML) 0.5% IN NEBU: 2.5000 mg | INHALATION_SOLUTION | Freq: Four times a day (QID) | RESPIRATORY_TRACT | Status: DC | PRN

## 2012-01-02 NOTE — Progress Notes (Signed)
Subjective: Sitting up in bed, she does not appear to be in any acute distress. Janina Mayo is in place with Passey-muir valve.   Objective: Vital signs in last 24 hours: Temp:  [98.3 F (36.8 C)-99.9 F (37.7 C)] 98.3 F (36.8 C) (07/10 0445) Pulse Rate:  [75-96] 78  (07/10 0759) Resp:  [20-24] 22  (07/10 0759) BP: (104-143)/(52-79) 106/70 mmHg (07/10 0445) SpO2:  [90 %-99 %] 97 % (07/10 0759) FiO2 (%):  [35 %] 35 % (07/10 0759) Weight:  [426 lb 9.4 oz (193.5 kg)-426 lb 13 oz (193.6 kg)] 426 lb 9.4 oz (193.5 kg) (07/09 2222) Last BM Date: 12/31/11  Intake/Output from previous day: 07/09 0701 - 07/10 0700 In: 525 [I.V.:525] Out: -  Intake/Output this shift:    General appearance: alert, cooperative, appears stated age, no distress and morbidly obese Abdominal wound: Dressing was saturated with old bloody drainage, no feculent odor is noted. when compressed on exam, there was no additional production of purulent drainage from I&D incision site. Some erythema is noted in the pannus above her mons. Wound care Nurse present at time of exam and will repack wound with iodoform gauze. WBC count is improving,and she remains afebrile.  Lab Results:   Basename 01/02/12 0546 01/01/12 1426  WBC 7.6 10.0  HGB 10.8* 12.0  HCT 33.4* 36.2  PLT 125* 126*   BMET  Basename 01/02/12 0546 01/01/12 1426  NA 137 137  K 2.5* 3.8  CL 92* 92*  CO2 30 35*  GLUCOSE 268* 282*  BUN 22 19  CREATININE 0.89 0.86  CALCIUM 8.2* 8.9   PT/INR  Basename 01/02/12 0546 01/01/12 2104  LABPROT 23.6* 26.3*  INR 2.06* 2.37*   ABG No results found for this basename: PHART:2,PCO2:2,PO2:2,HCO3:2 in the last 72 hours  Studies/Results: Dg Chest Port 1 View  01/01/2012  *RADIOLOGY REPORT*  Clinical Data:3-day history of fever, chills, and cough.  Chronic ventilator dependent respiratory failure.  PORTABLE CHEST - 1 VIEW 01/01/2012 1435 hours:  Comparison: Two-view chest x-ray 02/16/2010 and portable chest x-  ray 02/14/2009.  Findings: Tracheostomy tube tip in satisfactory position below the thoracic inlet.  Suboptimal inspiration due to body habitus which accounts for crowded bronchovascular markings at the bases and accentuates the cardiac silhouette.  Taking this into account, cardiac silhouette mildly enlarged but stable and lungs clear.  IMPRESSION: Suboptimal inspiration.  No acute cardiopulmonary disease.  Original Report Authenticated By: Arnell Sieving, [name]    Anti-infectives: Anti-infectives     Start     Dose/Rate Route Frequency Ordered Stop   01/02/12 1100   metroNIDAZOLE (FLAGYL) tablet 500 mg        500 mg Oral 3 times per day 01/02/12 0948     01/02/12 0900   vancomycin (VANCOCIN) 1,500 mg in sodium chloride 0.9 % 500 mL IVPB        1,500 mg 250 mL/hr over 120 Minutes Intravenous Every 12 hours 01/01/12 2109     01/02/12 0600   aztreonam (AZACTAM) 2 g in dextrose 5 % 50 mL IVPB  Status:  Discontinued        2 g 100 mL/hr over 30 Minutes Intravenous 3 times per day 01/02/12 0056 01/02/12 0057   01/02/12 0000   clindamycin (CLEOCIN) IVPB 900 mg  Status:  Discontinued        900 mg 100 mL/hr over 30 Minutes Intravenous 4 times per day 01/01/12 2111 01/02/12 0946   01/01/12 2200   aztreonam (AZACTAM) 2 GM IVPB  Status:  Discontinued        2 g 100 mL/hr over 30 Minutes Intravenous 3 times per day 01/01/12 2109 01/02/12 0839   01/01/12 2115   vancomycin (VANCOCIN) 2,500 mg in sodium chloride 0.9 % 500 mL IVPB        2,500 mg 250 mL/hr over 120 Minutes Intravenous  Once 01/01/12 2109 01/02/12 0559   01/01/12 1600   clindamycin (CLEOCIN) IVPB 900 mg  Status:  Discontinued        900 mg 100 mL/hr over 30 Minutes Intravenous  Once 01/01/12 1553 01/01/12 1800          Assessment/Plan: s/p * No surgery found * 1. Wound care as per above 2. Continue with Abx therapy as per medicine team. 3. Medical management per medicine. 4. We will continue to follow.  LOS: 1 day     [name] 01/02/2012

## 2012-01-02 NOTE — Progress Notes (Signed)
Inpatient Diabetes Program Recommendations  AACE/ADA: New Consensus Statement on Inpatient Glycemic Control (2009)  Target Ranges:  Prepandial:   less than 140 mg/dL      Peak postprandial:   less than 180 mg/dL (1-2 hours)      Critically ill patients:  140 - 180 mg/dL    Results for DOROTHEY, OETKEN (MRN [id number]) as of 01/02/2012 14:15  Ref. Range 01/02/2012 07:41 01/02/2012 11:35  Glucose-Capillary Latest Range: 70-99 mg/dL 829 (H) 562 (H)    Patient started on Lantus 85 units bid + Novolog Resistant SSI today.  Noted patient also takes a set dose of Novolog at home to cover food intake- Novolog 16 units tid with meals.  Inpatient Diabetes Program Recommendations Insulin - Meal Coverage: Please consider adding a portion of patient's home meal coverage- Novolog 8 units tid with meals   Note: Will follow. Ambrose Finland RN, MSN, CDE Diabetes Coordinator Inpatient Diabetes Program [phone]

## 2012-01-02 NOTE — Progress Notes (Signed)
ANTICOAGULATION CONSULT NOTE - Follow Up Consult  Pharmacy Consult for Warfarin Indication: Hx DVT  Allergies  Allergen Reactions  . Molds & Smuts Shortness Of Breath and Rash  . Allopurinol     GI  . Amoxicillin-Pot Clavulanate     unknown  . Ciprofloxacin     GI problems  . Codeine     Heart problems  . Fexofenadine     Hurts joints  . Metronidazole     unknown  . Morphine     unknown  . Sulfa Drugs Cross Reactors Nausea And Vomiting    nausea  . Sulfonamide Derivatives     nausea    Patient Measurements: Height: 5\' 8"  (172.7 cm) Weight: 426 lb 9.4 oz (193.5 kg) IBW/kg (Calculated) : 63.9   Vital Signs: Temp: 98.6 F (37 C) (07/10 1447) Temp src: Oral (07/10 1447) BP: 98/61 mmHg (07/10 1447) Pulse Rate: 88  (07/10 1530)  Labs:  Basename 01/02/12 0546 01/01/12 2104 01/01/12 1426  HGB 10.8* -- 12.0  HCT 33.4* -- 36.2  PLT 125* -- 126*  APTT 48* -- --  LABPROT 23.6* 26.3* --  INR 2.06* 2.37* --  HEPARINUNFRC -- -- --  CREATININE 0.89 -- 0.86  CKTOTAL -- -- --  CKMB -- -- --  TROPONINI -- -- --    Estimated Creatinine Clearance: 139.7 ml/min (by C-G formula based on Cr of 0.89).   Assessment: 51 y.o. F resumed on warfarin from PTA for hx DVT with a therapeutic INR this a.m (INR 2.06, goal of 2-3). The dose on 7/9 was missed due to attempted clarification on holding orders for potential replacement trach. It was clarified with Dr. David Stall to continue with warfarin for now as the trach replacement will likely be postponed given the patient's infection. PTA the patient was taking  5 mg daily EXCEPT for 7.5 mg on Wed/Sat/Sun. Will give higher dose tonight given missed dose on 7/9  Goal of Therapy:  INR 2-3   Plan:  1. Warfarin 7.5 mg x 1 dose at 1800 today 2. Will continue to monitor for any signs/symptoms of bleeding and will follow up with PT/INR in the a.m.   Georgina Pillion, PharmD, BCPS Clinical Pharmacist Pager: [phone] 01/02/2012 4:20 PM

## 2012-01-02 NOTE — Progress Notes (Signed)
CRITICAL VALUE ALERT  Critical value received:  Potassium 2.5  Date of notification:  01/02/12  Time of notification:  820  Critical value read back:yes  Nurse who received alert:  Madelin Rear  MD notified (1st page):  David Stall   Time of first page:  830  MD notified (2nd page):  Time of second page: 836  Responding MD:  David Stall  Time MD responded:  836

## 2012-01-02 NOTE — Progress Notes (Signed)
[facility] PROGRESS NOTE  [name] ZOX:[id number] DOB: 1961/05/25 DOA: 01/01/2012   Assessment/Plan: Patient Active Hospital Problem List: Abscess and cellulitis (01/01/2012) - patient is afebrile, leukocytosis has resolved. We'll go DC'd the vancomycin and Zosyn. -Status post I&D with packing. Will use clindamycin IV Will monitor for fevers and leukocytosis in the hospital. The patient is tolerating her diet. -Surgery following for I&D. -Wound care consulted.  Diabetes mellitus type 2 in obese (01/01/2012) -poorly controlled diabetes mellitus, check a hemoglobin A1c increased her Lantus. Continue CBGs 4 times a day .  Obesity (BMI 35.0-39.9 without comorbidity) (01/01/2012) -counseling.   OSA on CPAP (01/01/2012) -continue oxygen at night for her obesity hypoventilation syndrome. Trach collar.  HTN (hypertension), benign (01/01/2012) -hypertension fairly well controlled.  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 A possible trach collar at night.  Current use of long term anticoagulation (01/01/2012) -resume Coumadin. She has been on Coumadin since 2003. One episode of DVT and her significant immobility secondary to her weight. INR therapeutic we'll have to follow up with her primary care Dr. and address future Coumadin use.  Code Status: Full code Family Communication: Relative [phone] Disposition Plan: home     LOS: 1 day   Procedures:    Antibiotics:  Vancomycin on 01/01/2012>> 01/02/2012  Aztreonam 01/01/2012>> 01/02/2012 Clindamycin 01/01/2012>>    Subjective: Patient relates that her pannus pain is much improved.  Objective: Filed Vitals:   01/02/12 0445 01/02/12 0741 01/02/12 0759 01/02/12 1142  BP: 106/70     Pulse: 75 77 78 80  Temp: 98.3 F (36.8 C)     TempSrc: Oral     Resp: 24 22 22 24   Height:      Weight:      SpO2: 97% 98% 97%     Intake/Output Summary (Last 24 hours) at 01/02/12 1148 Last data  filed at 01/02/12 0700  Gross per 24 hour  Intake    525 ml  Output      0 ml  Net    525 ml   Weight change:   Exam:  General: Alert, awake, oriented x3, in no acute distress.  HEENT: No bruits, no goiter.  Heart: Regular rate and rhythm, without murmurs, rubs, gallops.  Lungs: Good air movement, bilateral air movement.  Abdomen: Soft, nontender, nondistended, positive bowel sounds.  Neuro: Grossly intact, nonfocal.   Data Reviewed: Basic Metabolic Panel:  Lab 01/02/12 5621 01/01/12 1426  NA 137 137  K 2.5* 3.8  CL 92* 92*  CO2 30 35*  GLUCOSE 268* 282*  BUN 22 19  CREATININE 0.89 0.86  CALCIUM 8.2* 8.9  MG -- --  PHOS -- --   Liver Function Tests:  Lab 01/02/12 0546 01/01/12 1426  AST 23 28  ALT 21 26  ALKPHOS 49 56  BILITOT 0.5 0.5  PROT 7.5 8.1  ALBUMIN 3.1* 3.4*   No results found for this basename: LIPASE:5,AMYLASE:5 in the last 168 hours No results found for this basename: AMMONIA:5 in the last 168 hours CBC:  Lab 01/02/12 0546 01/01/12 1426  WBC 7.6 10.0  NEUTROABS -- 7.4  HGB 10.8* 12.0  HCT 33.4* 36.2  MCV 94.1 94.8  PLT 125* 126*   Cardiac Enzymes: No results found for this basename: CKTOTAL:5,CKMB:5,CKMBINDEX:5,TROPONINI:5 in the last 168 hours BNP: No components found with this basename: POCBNP:5 CBG:  Lab 01/02/12 0741 01/01/12 2139 01/01/12 2008  GLUCAP 245* 232* 231*    Recent Results (from the past 240 hour(s))  CULTURE, BLOOD (ROUTINE X 2)     Status: Normal (Preliminary result)   Collection Time   01/01/12  4:05 PM      Component Value Range Status Comment   Specimen Description BLOOD ARM LEFT   Final    Special Requests BOTTLES DRAWN AEROBIC AND ANAEROBIC 10CC   Final    Culture  Setup Time 01/01/2012 20:06   Final    Culture     Final    Value:        BLOOD CULTURE RECEIVED NO GROWTH TO DATE CULTURE WILL BE HELD FOR 5 DAYS BEFORE ISSUING A FINAL NEGATIVE REPORT   Report Status PENDING   Incomplete   CULTURE, BLOOD (ROUTINE  X 2)     Status: Normal (Preliminary result)   Collection Time   01/01/12  4:15 PM      Component Value Range Status Comment   Specimen Description BLOOD ARM RIGHT   Final    Special Requests     Final    Value: BOTTLES DRAWN AEROBIC AND ANAEROBIC BLUE 7CC RED 4CC   Culture  Setup Time 01/01/2012 20:06   Final    Culture     Final    Value:        BLOOD CULTURE RECEIVED NO GROWTH TO DATE CULTURE WILL BE HELD FOR 5 DAYS BEFORE ISSUING A FINAL NEGATIVE REPORT   Report Status PENDING   Incomplete   WOUND CULTURE     Status: Normal (Preliminary result)   Collection Time   01/01/12  6:27 PM      Component Value Range Status Comment   Specimen Description WOUND PERINEAL   Final    Special Requests NONE   Final    Gram Stain     Final    Value: NO WBC SEEN     NO SQUAMOUS EPITHELIAL CELLS SEEN     NO ORGANISMS SEEN   Culture PENDING   Incomplete    Report Status PENDING   Incomplete   MRSA PCR SCREENING     Status: Abnormal   Collection Time   01/02/12  1:33 AM      Component Value Range Status Comment   MRSA by PCR POSITIVE (*) NEGATIVE Final      Studies: Dg Chest Port 1 View  01/01/2012  *RADIOLOGY REPORT*  Clinical Data:3-day history of fever, chills, and cough.  Chronic ventilator dependent respiratory failure.  PORTABLE CHEST - 1 VIEW 01/01/2012 1435 hours:  Comparison: Two-view chest x-ray 02/16/2010 and portable chest x- ray 02/14/2009.  Findings: Tracheostomy tube tip in satisfactory position below the thoracic inlet.  Suboptimal inspiration due to body habitus which accounts for crowded bronchovascular markings at the bases and accentuates the cardiac silhouette.  Taking this into account, cardiac silhouette mildly enlarged but stable and lungs clear.  IMPRESSION: Suboptimal inspiration.  No acute cardiopulmonary disease.  Original Report Authenticated By: Arnell Sieving, [name]    Scheduled Meds:   . acidophilus  1 capsule Oral Daily  . aspirin EC  81 mg Oral Daily  .  atorvastatin  10 mg Oral QHS  . budesonide  0.25 mg Nebulization BID  . bumetanide  2 mg Oral BID  . busPIRone  7.5 mg Oral BID  . Chlorhexidine Gluconate Cloth  6 each Topical Q0600  . cholestyramine  1 packet Oral Q8H  . clindamycin  300 mg Oral Q8H  . dicyclomine  20 mg Oral BID AC  . DULoxetine  30 mg Oral BID  .  febuxostat  80 mg Oral QHS  . fesoterodine  4 mg Oral Daily  . gabapentin  100 mg Oral QID  . insulin aspart  0-20 Units Subcutaneous TID WC  . insulin glargine  85 Units Subcutaneous BID  . irbesartan  150 mg Oral Daily  . metolazone  5 mg Oral QODAY  . mupirocin ointment  1 application Nasal BID  . nebivolol  10 mg Oral Daily  . pantoprazole  40 mg Oral QAC breakfast  . potassium chloride  20 mEq Oral TID  . potassium chloride  40 mEq Oral TID  . primidone  25 mg Oral Daily   And  . primidone  75 mg Oral QHS  . topiramate  25 mg Oral Daily  . vancomycin  2,500 mg Intravenous Once  . Vitamin D (Ergocalciferol)  50,000 Units Oral Q7 days  . DISCONTD: albuterol  2.5 mg Nebulization Q6H  . DISCONTD: aztreonam  2 g Intravenous Q8H  . DISCONTD: aztreonam  2 g Intravenous Q8H  . DISCONTD: clindamycin (CLEOCIN) IV  900 mg Intravenous Once  . DISCONTD: clindamycin (CLEOCIN) IV  900 mg Intravenous Q6H  . DISCONTD: colchicine  0.6 mg Oral Daily  . DISCONTD: ergocalciferol  50,000 Units Oral Weekly  . DISCONTD: fish oil-omega-3 fatty acids  2 g Oral Daily  . DISCONTD: guaiFENesin  1,200 mg Oral BID  . DISCONTD: insulin glargine  70 Units Subcutaneous BID  . DISCONTD: Liraglutide  1.8 mL Subcutaneous q morning - 10a  . DISCONTD: meclizine  25 mg Oral QHS  . DISCONTD: metroNIDAZOLE  500 mg Oral Q8H  . DISCONTD: multivitamin  1 capsule Oral Daily  . DISCONTD: multivitamin with minerals  1 tablet Oral Daily  . DISCONTD: omega-3 acid ethyl esters  2 g Oral Daily  . DISCONTD: potassium chloride  20 mEq Oral TID  . DISCONTD: potassium chloride  20 mEq Oral TID  . DISCONTD:  primidone  25 mg Oral Daily  . DISCONTD: primidone  25-75 mg Oral BID  . DISCONTD: primidone  75 mg Oral QHS  . DISCONTD: PROBIOTIC  1 capsule Oral Daily  . DISCONTD: topiramate  50 mg Oral Daily  . DISCONTD: topiramate  50 mg Oral Daily  . DISCONTD: vancomycin  1,500 mg Intravenous Q12H  . DISCONTD: warfarin  5-7.5 mg Oral Daily   Continuous Infusions:   . sodium chloride 10 mL/hr (01/02/12 1109)    Lambert Keto, MD  [facility] Pager [phone]  If 7PM-7AM, please contact night-coverage [email or web address] Password [email or web address] 01/02/2012, 11:48 AM

## 2012-01-02 NOTE — Progress Notes (Signed)
RT Note: Changed to Albuterol PRN per pt request.

## 2012-01-02 NOTE — Progress Notes (Signed)
I have seen and examined the patient and agree with the assessment and plans.  Aalani Aikens A. Keontre Defino  MD, FACS  

## 2012-01-02 NOTE — Consult Note (Addendum)
WOC consult Note Reason for Consult: requested to eval for wound care for s/p I&D site on the mons pubis area.  She reports area was worsening over several days and she experienced pain when trying to sit.  I examined pt with NP with [facility] and we removed old packing from site and were unable to express any further purulent material, no odor noted. Bloody drainage on dressing that was in place from ER yesterday. Packing saturated.   Wound type: S/P I&D abscess mons pubis area Drainage (amount, consistency, odor) moderate to heavy bloody drainage on old dressing and packing strip Periwound:surrounding site erythematous  and with some induration noted  Dressing procedure/placement/frequency: I will repack with iodoform gauze for the antibacterial effects and this will serve as wick to keep this area open for further drainage and decrease likelihood for recollection of fluid here.  Medicine to manage abtx. May need HHRN at dc to continue to pack this area and monitor for healing.  Re consult if needed, will not follow at this time. Thanks  Dawnelle Warman [employer], CWOCN [phone])     11:08 Packed I&D site, while pt bent over bed/standing, this gives very good exposure to the area for packing.  Packed with iodoform gauze. Pt tolerated without problems.  Will attempt to have bedside nurse cover with ABD and secure in place with  Mesh panty (using one leg of the panty as a sling around the mons pubis) to avoid tape to this area. Zephyra Bernardi [place], [place] [phone]

## 2012-01-02 NOTE — Care Management Note (Signed)
    Page 1 of 2   01/03/2012     12:43:05 PM   CARE MANAGEMENT NOTE 01/03/2012  Patient:  [name], [name]   Account Number:  [id number]  Date Initiated:  01/02/2012  Documentation initiated by:  Letha Cape  Subjective/Objective Assessment:   dx abscess and cellulitis  admit- lives alone. Has aide through [facility]  7 days/week and resp therapist through [facility] who does trach care for her.     Action/Plan:   pt/ot eval   Anticipated DC Date:  01/05/2012   Anticipated DC Plan:  HOME W HOME HEALTH SERVICES      DC Planning Services  CM consult      [facility] Choice  HOME HEALTH   Choice offered to / List presented to:  C-1 Patient        HH arranged  HH-1 RN  HH-7 RESPIRATORY THERAPY      HH agency  [facility]   Status of service:  Completed, signed off Medicare Important Message given?   (If response is "NO", the following Medicare IM given date fields will be blank) Date Medicare IM given:   Date Additional Medicare IM given:    Discharge Disposition:  HOME W HOME HEALTH SERVICES  Per UR Regulation:  Reviewed for med. necessity/level of care/duration of stay  If discussed at Long Length of Stay Meetings, dates discussed:    Comments:  PCP Maurice Small  01/03/12 11:55 Letha Cape RN, BSN [phone] patient for dc today, patient's aide with [facility] will pick patient up around 2 pm today.  Patient will need pt/inr apt for Monday 7/15, Secretary will make this apt.  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  Patient states she does not want to go to a snf, she would like to go home with [facility] services and she would like to work with [facility].  Referral made to [facility]  for [facility], Resp therapy for trach care.  Home Health RN would not be able to do dressing changes every day but can  show patient's support system how to do dressing changes. Patient has a w/chair, and an electric w/ chair, a rolling walker, a reg walker, a straight cane, a bsc, a nebulizer machine and a vent.   Referral made to [facility].  NCM will continue to follow for dc needs.

## 2012-01-03 DIAGNOSIS — I10 Essential (primary) hypertension: Secondary | ICD-10-CM

## 2012-01-03 LAB — BASIC METABOLIC PANEL
BUN: 26 mg/dL — ABNORMAL HIGH (ref 6–23)
CO2: 32 mEq/L (ref 19–32)
Calcium: 7.9 mg/dL — ABNORMAL LOW (ref 8.4–10.5)
Creatinine, Ser: 0.97 mg/dL (ref 0.50–1.10)
GFR calc non Af Amer: 67 mL/min — ABNORMAL LOW (ref >90)
Glucose, Bld: 221 mg/dL — ABNORMAL HIGH (ref 70–99)

## 2012-01-03 LAB — URINE CULTURE
Colony Count: NO GROWTH
Culture: NO GROWTH

## 2012-01-03 LAB — GLUCOSE, CAPILLARY

## 2012-01-03 MED ORDER — SULFAMETHOXAZOLE-TMP DS 800-160 MG PO TABS
1.0000 | ORAL_TABLET | Freq: Two times a day (BID) | ORAL | Status: DC
  Administered 2012-01-03: 1 via ORAL
  Filled 2012-01-03 (×2): qty 1

## 2012-01-03 MED ORDER — METRONIDAZOLE 500 MG PO TABS
500.0000 mg | ORAL_TABLET | Freq: Three times a day (TID) | ORAL | Status: DC
  Administered 2012-01-03: 500 mg via ORAL
  Filled 2012-01-03 (×3): qty 1

## 2012-01-03 MED ORDER — METRONIDAZOLE 500 MG PO TABS: 500.0000 mg | ORAL_TABLET | Freq: Three times a day (TID) | ORAL | Status: AC

## 2012-01-03 MED ORDER — TOPIRAMATE 25 MG PO TABS: 25.0000 mg | ORAL_TABLET | Freq: Every day | ORAL | Status: DC

## 2012-01-03 NOTE — Progress Notes (Signed)
I have seen and examined the patient and agree with the assessment and plans.  Ok for discharge from surgery standpoint.  Mehtaab Mayeda A. Adrena Nakamura  MD, FACS  

## 2012-01-03 NOTE — Evaluation (Signed)
Occupational Therapy Evaluation and Discharge Patient Details Name: [name] MRN: [id number] DOB: [birth year] Today's Date: 01/03/2012 Time: 7425-9563 OT Time Calculation (min): 38 min  OT Assessment / Plan / Recommendation Clinical Impression  This 51 yo female admittd with cellulitis and gentialia wound presents to acute OT at her baseline from a BADL standpoint. D/C from acute OT.    OT Assessment  Patient does not need any further OT services    Follow Up Recommendations  No OT follow up    Barriers to Discharge      Equipment Recommendations  None recommended by OT    Recommendations for Other Services    Frequency       Precautions / Restrictions Precautions Precautions: Fall Precaution Comments: trach Restrictions Weight Bearing Restrictions: No       ADL  Eating/Feeding: Simulated;Independent Where Assessed - Eating/Feeding: Edge of bed Grooming: Simulated;Set up Where Assessed - Grooming: Unsupported sitting Upper Body Bathing: Simulated;Minimal assistance Where Assessed - Upper Body Bathing: Unsupported sit to stand Lower Body Bathing: Simulated;Moderate assistance Where Assessed - Lower Body Bathing: Unsupported sit to stand Upper Body Dressing: Simulated;Minimal assistance Where Assessed - Upper Body Dressing: Unsupported sit to stand Lower Body Dressing: Simulated;Moderate assistance Where Assessed - Lower Body Dressing: Unsupported sit to stand Toilet Transfer: Simulated;Supervision/safety Toilet Transfer Method: Sit to Barista: Extra wide bedside commode Toileting - Clothing Manipulation and Hygiene: Simulated (total A currently due to wound) Where Assessed - Toileting Clothing Manipulation and Hygiene: Standing Equipment Used: Rolling walker Transfers/Ambulation Related to ADLs: Min guard A    OT Diagnosis:    OT Problem List:   OT Treatment Interventions:     OT Goals    Visit Information  Last OT Received On:  01/03/12 Assistance Needed: +1 PT/OT Co-Evaluation/Treatment: Yes    Subjective Data  Subjective: I have a good support system at home Patient Stated Goal: To get back home   Prior Functioning  Home Living Lives With: Alone Available Help at Discharge: Personal care attendant;Available PRN/intermittently (5 hours M-F, 3 hours Sat and Sun) Home Layout: One level Bathroom Shower/Tub: Health visitor: Handicapped height Home Adaptive Equipment: Shower chair without back;Grab bars in shower;Grab bars around toilet;Walker - rolling;Walker - Curator - four wheeled;Wheelchair - manual;Wheelchair - powered;Bedside commode/3-in-1 Additional Comments: sleeps in recliner Prior Function Level of Independence: Needs assistance Needs Assistance: Bathing;Dressing;Meal Prep;Light Housekeeping Bath: Minimal Dressing: Minimal Meal Prep: Minimal (sits in the chair to cook) Light Housekeeping: Total Able to Take Stairs?: No Driving: Yes Vocation: On disability Communication Communication: No difficulties;Passy-Muir valve Dominant Hand: Right    Cognition  Overall Cognitive Status: Appears within functional limits for tasks assessed/performed Arousal/Alertness: Awake/alert Orientation Level: Appears intact for tasks assessed Behavior During Session: [facility] for tasks performed    Extremity/Trunk Assessment Right Upper Extremity Assessment RUE ROM/Strength/Tone: Within functional levels Left Upper Extremity Assessment LUE ROM/Strength/Tone: Within functional levels Right Lower Extremity Assessment RLE ROM/Strength/Tone: WFL for tasks assessed RLE Sensation: WFL - Light Touch;WFL - Proprioception RLE Coordination: WFL - gross/fine motor Left Lower Extremity Assessment LLE ROM/Strength/Tone: WFL for tasks assessed LLE Sensation: WFL - Light Touch;WFL - Proprioception LLE Coordination: WFL - gross/fine motor   Mobility Bed Mobility Bed Mobility: Supine to Sit;Sitting -  Scoot to Edge of Bed Supine to Sit: 6: Modified independent (Device/Increase time);HOB elevated (HOB 60 degrees) Sitting - Scoot to Edge of Bed: 6: Modified independent (Device/Increase time) Transfers Sit to Stand: 5: Supervision;From bed;From chair/3-in-1;With upper extremity assist;With  armrests Stand to Sit: 5: Supervision;To chair/3-in-1;With armrests;With upper extremity assist Details for Transfer Assistance: cues for hand placement   Exercise    Balance    End of Session OT - End of Session Equipment Utilized During Treatment:  (Wide RW, wide 3-n-1) Activity Tolerance: Patient tolerated treatment well Patient left: in chair;with call bell/phone within reach       Evette Georges [phone] 01/03/2012, 10:38 AM

## 2012-01-03 NOTE — Progress Notes (Signed)
Patient ID: [name], female   DOB: Nov 19, 1960, 51 y.o.   MRN: [id number]    Subjective: In hallway ambulating with physical therapy, she does not appear to be in any acute distress. [name] is in place with Passey-muir valve.   Objective: Vital signs in last 24 hours: Temp:  [97.5 F (36.4 C)-98.6 F (37 C)] 98 F (36.7 C) (07/11 0500) Pulse Rate:  [74-88] 76  (07/11 0845) Resp:  [16-24] 19  (07/11 0845) BP: (98-121)/(61-70) 121/70 mmHg (07/11 0500) SpO2:  [91 %-98 %] 91 % (07/11 0900) FiO2 (%):  [35 %] 35 % (07/11 0344) Last BM Date: 01/02/12  Intake/Output from previous day: 07/10 0701 - 07/11 0700 In: 480 [P.O.:480] Out: 775 [Urine:775] Intake/Output this shift: Total I/O In: -  Out: 450 [Urine:450]  General appearance: alert, cooperative, appears stated age, no distress and morbidly obese Abdominal wound: Patient report still some drainage from wound, will check site once patient returns to room.  Lab Results:   Basename 01/02/12 0546 01/01/12 1426  WBC 7.6 10.0  HGB 10.8* 12.0  HCT 33.4* 36.2  PLT 125* 126*   BMET  Basename 01/02/12 0546 01/01/12 1426  NA 137 137  K 2.5* 3.8  CL 92* 92*  CO2 30 35*  GLUCOSE 268* 282*  BUN 22 19  CREATININE 0.89 0.86  CALCIUM 8.2* 8.9   PT/INR  Basename 01/02/12 0546 01/01/12 2104  LABPROT 23.6* 26.3*  INR 2.06* 2.37*   ABG No results found for this basename: PHART:2,PCO2:2,PO2:2,HCO3:2 in the last 72 hours  Studies/Results: Dg Chest Port 1 View  01/01/2012  *RADIOLOGY REPORT*  Clinical Data:3-day history of fever, chills, and cough.  Chronic ventilator dependent respiratory failure.  PORTABLE CHEST - 1 VIEW 01/01/2012 1435 hours:  Comparison: Two-view chest x-ray 02/16/2010 and portable chest x- ray 02/14/2009.  Findings: Tracheostomy tube tip in satisfactory position below the thoracic inlet.  Suboptimal inspiration due to body habitus which accounts for crowded bronchovascular markings at the bases and accentuates  the cardiac silhouette.  Taking this into account, cardiac silhouette mildly enlarged but stable and lungs clear.  IMPRESSION: Suboptimal inspiration.  No acute cardiopulmonary disease.  Original Report Authenticated By: Arnell Sieving, [name]    Anti-infectives: Anti-infectives     Start     Dose/Rate Route Frequency Ordered Stop   01/02/12 1400   clindamycin (CLEOCIN) capsule 300 mg        300 mg Oral 3 times per day 01/02/12 1003     01/02/12 1100   metroNIDAZOLE (FLAGYL) tablet 500 mg  Status:  Discontinued        500 mg Oral 3 times per day 01/02/12 0948 01/02/12 0959   01/02/12 0900   vancomycin (VANCOCIN) 1,500 mg in sodium chloride 0.9 % 500 mL IVPB  Status:  Discontinued        1,500 mg 250 mL/hr over 120 Minutes Intravenous Every 12 hours 01/01/12 2109 01/02/12 1000   01/02/12 0600   aztreonam (AZACTAM) 2 g in dextrose 5 % 50 mL IVPB  Status:  Discontinued        2 g 100 mL/hr over 30 Minutes Intravenous 3 times per day 01/02/12 0056 01/02/12 0057   01/02/12 0000   clindamycin (CLEOCIN) IVPB 900 mg  Status:  Discontinued        900 mg 100 mL/hr over 30 Minutes Intravenous 4 times per day 01/01/12 2111 01/02/12 0946   01/01/12 2200   aztreonam (AZACTAM) 2 GM IVPB  Status:  Discontinued        2 g 100 mL/hr over 30 Minutes Intravenous 3 times per day 01/01/12 2109 01/02/12 0839   01/01/12 2115   vancomycin (VANCOCIN) 2,500 mg in sodium chloride 0.9 % 500 mL IVPB        2,500 mg 250 mL/hr over 120 Minutes Intravenous  Once 01/01/12 2109 01/02/12 0559   01/01/12 1600   clindamycin (CLEOCIN) IVPB 900 mg  Status:  Discontinued        900 mg 100 mL/hr over 30 Minutes Intravenous  Once 01/01/12 1553 01/01/12 1800          Assessment/Plan: s/p * No surgery found * 1. Wound care as per above 2. Continue with Abx therapy as per medicine team. 3. Medical management per medicine. 4. We will continue to follow.   LOS: 2 days    [name] 01/03/2012

## 2012-01-03 NOTE — Evaluation (Signed)
Physical Therapy Evaluation Patient Details Name: [name] MRN: [id number] DOB: 14-Nov-1960 Today's Date: 01/03/2012 Time:  -     PT Assessment / Plan / Recommendation Clinical Impression  [name] is 50y/o female admitted with cellulitis and vaginal abscess. Presents to physical therapy today with decreased activity tolerance affecting her overall independence with mobility and ADLs. Will benefit physical therapy in the acute setting to address these and the below probelm list. Rec HHPT for f/u. No DME needs.     PT Assessment  Patient needs continued PT services    Follow Up Recommendations  Home health PT    Barriers to Discharge        Equipment Recommendations  None recommended by PT    Recommendations for Other Services     Frequency Min 3X/week    Precautions / Restrictions Precautions Precautions: Fall Precaution Comments: trach Restrictions Weight Bearing Restrictions: No   Pertinent Vitals/Pain Morbidly obese       Mobility  Bed Mobility Bed Mobility: Supine to Sit;Sitting - Scoot to Edge of Bed Supine to Sit: 6: Modified independent (Device/Increase time);HOB elevated (HOB 60 degrees) Sitting - Scoot to Edge of Bed: 6: Modified independent (Device/Increase time) Transfers Transfers: Sit to Stand;Stand to Sit Sit to Stand: 5: Supervision;From bed;From chair/3-in-1;With upper extremity assist;With armrests Stand to Sit: 5: Supervision;To chair/3-in-1;With armrests;With upper extremity assist Details for Transfer Assistance: cues for hand placement Ambulation/Gait Ambulation/Gait Assistance: 5: Supervision Ambulation Distance (Feet): 100 Feet (50 x2 (seated rest break)) Assistive device: Rolling walker Ambulation/Gait Assistance Details: wide BOS and decreased step length, cues for upright posture and safe proximity to RW, easily winded and fatigued, DOE 3/4 needing seated rest break after 50 ft   Exercises     PT Diagnosis: Difficulty  walking;Abnormality of gait;Generalized weakness;Acute pain  PT Problem List: Decreased activity tolerance;Decreased mobility;Obesity;Pain;Cardiopulmonary status limiting activity PT Treatment Interventions: DME instruction;Gait training;Functional mobility training;Therapeutic activities;Therapeutic exercise;Neuromuscular re-education;Patient/family education   PT Goals Acute Rehab PT Goals PT Goal Formulation: With patient Time For Goal Achievement: 01/17/12 Potential to Achieve Goals: Fair Pt will go Sit to Stand: with modified independence PT Goal: Sit to Stand - Progress: Goal set today Pt will go Stand to Sit: with modified independence PT Goal: Stand to Sit - Progress: Goal set today Pt will Transfer Bed to Chair/Chair to Bed: with modified independence PT Transfer Goal: Bed to Chair/Chair to Bed - Progress: Goal set today Pt will Ambulate: >150 feet;with modified independence;with least restrictive assistive device (with DOE </=2/4 ) PT Goal: Ambulate - Progress: Goal set today Pt will Perform Home Exercise Program: Independently PT Goal: Perform Home Exercise Program - Progress: Goal set today   Visit Information  Last PT Received On: 01/03/12    Subjective Data  Subjective: Oh honey I couldn't hardly sit.    Prior Functioning  Home Living Lives With: Alone Available Help at Discharge: Personal care attendant;Available PRN/intermittently (5 hours M-F, 3 hours Sat and Sun) Home Layout: One level Bathroom Shower/Tub: Health visitor: Handicapped height Home Adaptive Equipment: Shower chair without back;Grab bars in shower;Grab bars around toilet;Walker - rolling;Walker - Curator - four wheeled;Wheelchair - manual;Wheelchair - powered;Bedside commode/3-in-1 Additional Comments: sleeps in recliner Prior Function Level of Independence: Needs assistance Needs Assistance: Bathing;Dressing;Meal Prep;Light Housekeeping Bath: Minimal Dressing:  Minimal Meal Prep: Minimal (sits in the chair to cook) Light Housekeeping: Total Able to Take Stairs?: No Driving: Yes Vocation: On disability Communication Communication: No difficulties;Passy-Muir valve    Cognition  Overall  Cognitive Status: Appears within functional limits for tasks assessed/performed Arousal/Alertness: Awake/alert Orientation Level: Appears intact for tasks assessed Behavior During Session: [facility] for tasks performed    Extremity/Trunk Assessment Right Upper Extremity Assessment RUE ROM/Strength/Tone:  (defer to OT) Left Upper Extremity Assessment LUE ROM/Strength/Tone:  (defer to OT) Right Lower Extremity Assessment RLE ROM/Strength/Tone: WFL for tasks assessed RLE Sensation: WFL - Light Touch;WFL - Proprioception RLE Coordination: WFL - gross/fine motor Left Lower Extremity Assessment LLE ROM/Strength/Tone: WFL for tasks assessed LLE Sensation: WFL - Light Touch;WFL - Proprioception LLE Coordination: WFL - gross/fine motor   Balance Balance Balance Assessed: Yes Static Standing Balance Static Standing - Balance Support: Right upper extremity supported Static Standing - Level of Assistance: 4: Min assist  End of Session PT - End of Session Equipment Utilized During Treatment: Gait belt Activity Tolerance: Patient tolerated treatment well Patient left: in chair;with call bell/phone within reach Nurse Communication: Mobility status  GP     [facility] HELEN 01/03/2012, 1:49 PM

## 2012-01-03 NOTE — Progress Notes (Signed)
Patient is currently active with long-term disease management services with [facility] as a benefit of her [employer]. Patient will receive a post discharge transition of care call and continued monthly home visits for assessments and for education in addition to her home health services. Spoke with [name] and made her aware to expect call post dc.  Raiford Noble, MSN- Ed, Charity fundraiser, BSN [facility] Liaison [phone]

## 2012-01-03 NOTE — Progress Notes (Signed)
[name] to be D/C'd Home per MD order.  Discussed with the patient and all questions fully answered.   [name], [name]  Home Medication Instructions [id number]   Printed on:01/03/12 1547  Medication Information                    ALPRAZolam (XANAX) 0.5 MG tablet Take 0.5 mg by mouth 2 (two) times daily as needed. For anxiety           Simethicone 180 MG CAPS Take 1 capsule by mouth daily as needed. For gas           dicyclomine (BENTYL) 20 MG tablet Take 20 mg by mouth 2 (two) times daily.             bumetanide (BUMEX) 1 MG tablet Take 2 mg by mouth 2 (two) times daily.            busPIRone (BUSPAR) 5 MG tablet Take 7.5 mg by mouth 2 (two) times daily. \           DULoxetine (CYMBALTA) 30 MG capsule Take 30 mg by mouth 2 (two) times daily.             valsartan (DIOVAN) 160 MG tablet Take 160 mg by mouth daily.             potassium chloride (KLOR-CON) 20 MEQ packet Take 20 mEq by mouth 3 (three) times daily.             insulin glargine (LANTUS) 100 UNIT/ML injection Inject 70 Units into the skin 2 (two) times daily.            guaiFENesin (MUCINEX) 600 MG 12 hr tablet Take 1,200 mg by mouth 2 (two) times daily.             Multiple Vitamin (MULTIVITAMIN) capsule Take 1 capsule by mouth daily.             primidone (MYSOLINE) 50 MG tablet Take 25-75 mg by mouth 2 (two) times daily. Take 25 MG in the morning and 75 MG at bedtime.           gabapentin (NEURONTIN) 100 MG capsule Take 100 mg by mouth 4 (four) times daily.            insulin aspart (NOVOLOG) 100 UNIT/ML injection Inject 16 Units into the skin 3 (three) times daily before meals.            omeprazole (PRILOSEC) 40 MG capsule Take 40 mg by mouth daily.             PROBIOTIC CAPS Take 1 capsule by mouth daily.            fesoterodine (TOVIAZ) 4 MG TB24 Take 4 mg by mouth daily.             febuxostat (ULORIC) 40 MG tablet Take 80 mg by mouth at bedtime.              HYDROcodone-acetaminophen (VICODIN) 5-500 MG per tablet Take 1 tablet by mouth every 6 (six) hours as needed. For pain           warfarin (COUMADIN) 7.5 MG tablet Take 5-7.5 mg by mouth daily. As directed. Take the 5mg  mon, tues, thurs, and fridays, and 7.5mg  rest of days           ergocalciferol (VITAMIN D2) 50000 UNITS capsule Take 50,000 Units by mouth once a week. Take on Fridays  atorvastatin (LIPITOR) 10 MG tablet Take 10 mg by mouth at bedtime.            nebivolol (BYSTOLIC) 10 MG tablet Take 10 mg by mouth daily.             acetaminophen (TYLENOL) 500 MG tablet Take 1,000 mg by mouth as needed. For pain           fish oil-omega-3 fatty acids 1000 MG capsule Take 2 g by mouth daily.           polyethylene glycol (MIRALAX / GLYCOLAX) packet Take 17 g by mouth daily as needed. For constipation.           albuterol (PROVENTIL) (2.5 MG/3ML) 0.083% nebulizer solution Take 2.5 mg by nebulization every 4 (four) hours as needed. For shortness of breath           budesonide (PULMICORT) 0.25 MG/2ML nebulizer solution Take 0.25 mg by nebulization 3 (three) times daily as needed. For shortness of breath.           aspirin EC 81 MG tablet Take 81 mg by mouth daily.           Liraglutide (VICTOZA) 18 MG/3ML SOLN Inject 1.8 mLs into the skin every morning.           cholestyramine (QUESTRAN) 4 G packet Take 1 packet by mouth as needed. For loose stools, IBS.           metolazone (ZAROXOLYN) 5 MG tablet Take 5 mg by mouth every other day.           metroNIDAZOLE (FLAGYL) 500 MG tablet Take 1 tablet (500 mg total) by mouth every 8 (eight) hours.           topiramate (TOPAMAX) 25 MG tablet Take 1 tablet (25 mg total) by mouth daily.             VVS, IV catheter discontinued intact. Site without signs and symptoms of complications. Dressing and pressure applied. Changed dressing on incision site.  An After Visit Summary was printed and given to the patient. Follow  up appointments , new prescriptions and medication administration times given Patient escorted via WC, and D/C home via private auto.  Cindra Eves, RN 01/03/2012 3:47 PM

## 2012-01-03 NOTE — Discharge Summary (Signed)
Physician Discharge Summary  [name]:[id number] DOB: 10-14-1960 DOA: 01/01/2012  PCP: Astrid Divine, MD  Admit date: 01/01/2012 Discharge date: 01/03/2012  Discharge Diagnoses:  Active Problems:  Abscess and cellulitis  Diabetes mellitus type 2 in obese  Obesity (BMI 35.0-39.9 without comorbidity)  OSA on CPAP  HTN (hypertension), benign  GERD (gastroesophageal reflux disease)  Tracheostomy dependence  Current use of long term anticoagulation   Discharge Condition: Stable for discharge  Disposition:  Follow-up Information    Follow up with [facility], MD in 2 weeks. (If symptoms worsen call our office as needed)    Contact information:   [facility] [address] [place] [address] [phone]       Follow up with Astrid Divine, MD in 1 week. (July 17th at 12:00pm.)    Contact information:   [address], [address] [place] [address] [phone]          Diet: Carb. modify diet  History of present illness:  [name] has an extensive medical history including morbid obesity. She comes in with 3 day hx of fever and pain in the groin area. She was found to have abscess formation around the vaginal area. Dr Fredderick Phenix performed I$D but referred patient to hospitalist service for IV abx as patient seems to also have cellulitis, and is at high risk of complications because of her comobidities. She says she feels generally bad, and has had fever. Dr Fredderick Phenix has also consulted surgery to help direct care for the abscess in house. It is conceivable patient may require further I and D. Appreciate surgical assistance.   Hospital Course:   Abscess and cellulitis: She was a mid to the hospital started empirically on vancomycin, clindamycin, asactam. This was quickly he escalated to clindamycin. Surgery was consulted they I. indeed this and was packed. They recommended wound care to followup as  an outpatient. She will go home on 2 weeks of doxycycline and Flagyl. She will do daily dressing changes with the nurse at home. She did not have any fevers or further increase in her white blood cell count after I. indeed and IV antibiotics.    Diabetes mellitus type 2 in obese No changes were made continue current treatment.   OSA on CPAP -no changes were made.   HTN (hypertension), benign -Control continue current treatment.  Current use of long term anticoagulation She will be followed up on Monday with PT nylon she was sent on Doxy and Flagyl. Her INR remained therapeutic throughout her hospital stay.  Discharge Exam: Filed Vitals:   01/03/12 0845  BP:   Pulse: 76  Temp:   Resp: 19   Filed Vitals:   01/03/12 0500 01/03/12 0845 01/03/12 0900 01/03/12 1208  BP: 121/70     Pulse: 75 76    Temp: 98 F (36.7 C)     TempSrc: Oral     Resp: 18 19    Height:      Weight:      SpO2: 98% 98% 91% 96%   General: Alert awake and oriented x3 Cardiovascular: Regular rate and rhythm Respiratory: Good air movement clear to auscultation  Discharge Instructions  Discharge Orders    Future Appointments: Provider: Department: Dept Phone: Center:   01/04/2012 9:00 AM [facility]  None   03/04/2012 10:30 AM Young Berry May, RN, RD, CDE [facility] [phone] NDM   06/09/2012 1:30 PM  Beverely Pace Sh[facility] [phone] None   06/09/2012 2:00 PM Victorino December, MD [facility] [phone] None     Future Orders Please Complete By Expires   Diet - low sodium heart healthy      Increase activity slowly        Medication List  As of 01/03/2012 12:31 PM   STOP taking these medications         B COMPLEX VITAMINS PO      COLCRYS 0.6 MG tablet      loratadine 10 MG tablet      meclizine 25 MG tablet      promethazine 25 MG tablet         TAKE these medications         acetaminophen 500 MG tablet   Commonly known as: TYLENOL   Take  1,000 mg by mouth as needed. For pain      albuterol (2.5 MG/3ML) 0.083% nebulizer solution   Commonly known as: PROVENTIL   Take 2.5 mg by nebulization every 4 (four) hours as needed. For shortness of breath      ALPRAZolam 0.5 MG tablet   Commonly known as: XANAX   Take 0.5 mg by mouth 2 (two) times daily as needed. For anxiety      aspirin EC 81 MG tablet   Take 81 mg by mouth daily.      atorvastatin 10 MG tablet   Commonly known as: LIPITOR   Take 10 mg by mouth at bedtime.      BENTYL 20 MG tablet   Generic drug: dicyclomine   Take 20 mg by mouth 2 (two) times daily.      budesonide 0.25 MG/2ML nebulizer solution   Commonly known as: PULMICORT   Take 0.25 mg by nebulization 3 (three) times daily as needed. For shortness of breath.      bumetanide 1 MG tablet   Commonly known as: BUMEX   Take 2 mg by mouth 2 (two) times daily.      busPIRone 5 MG tablet   Commonly known as: BUSPAR   Take 7.5 mg by mouth 2 (two) times daily. \      BYSTOLIC 10 MG tablet   Generic drug: nebivolol   Take 10 mg by mouth daily.      cholestyramine 4 G packet   Commonly known as: QUESTRAN   Take 1 packet by mouth as needed. For loose stools, IBS.      DULoxetine 30 MG capsule   Commonly known as: CYMBALTA   Take 30 mg by mouth 2 (two) times daily.      ergocalciferol 50000 UNITS capsule   Commonly known as: VITAMIN D2   Take 50,000 Units by mouth once a week. Take on Fridays      fish oil-omega-3 fatty acids 1000 MG capsule   Take 2 g by mouth daily.      gabapentin 100 MG capsule   Commonly known as: NEURONTIN   Take 100 mg by mouth 4 (four) times daily.      guaiFENesin 600 MG 12 hr tablet   Commonly known as: MUCINEX   Take 1,200 mg by mouth 2 (two) times daily.      HYDROcodone-acetaminophen 5-500 MG per tablet   Commonly known as: VICODIN   Take 1 tablet by mouth every 6 (six) hours as needed. For pain      insulin aspart 100 UNIT/ML injection   Commonly known as:  novoLOG  Inject 16 Units into the skin 3 (three) times daily before meals.      insulin glargine 100 UNIT/ML injection   Commonly known as: LANTUS   Inject 70 Units into the skin 2 (two) times daily.      metolazone 5 MG tablet   Commonly known as: ZAROXOLYN   Take 5 mg by mouth every other day.      metroNIDAZOLE 500 MG tablet   Commonly known as: FLAGYL   Take 1 tablet (500 mg total) by mouth every 8 (eight) hours.      multivitamin capsule   Take 1 capsule by mouth daily.      omeprazole 40 MG capsule   Commonly known as: PRILOSEC   Take 40 mg by mouth daily.      polyethylene glycol packet   Commonly known as: MIRALAX / GLYCOLAX   Take 17 g by mouth daily as needed. For constipation.      potassium chloride 20 MEQ packet   Commonly known as: KLOR-CON   Take 20 mEq by mouth 3 (three) times daily.      primidone 50 MG tablet   Commonly known as: MYSOLINE   Take 25-75 mg by mouth 2 (two) times daily. Take 25 MG in the morning and 75 MG at bedtime.      PROBIOTIC Caps   Take 1 capsule by mouth daily.      Simethicone 180 MG Caps   Take 1 capsule by mouth daily as needed. For gas      topiramate 25 MG tablet   Commonly known as: TOPAMAX   Take 1 tablet (25 mg total) by mouth daily.      TOVIAZ 4 MG Tb24   Generic drug: fesoterodine   Take 4 mg by mouth daily.      ULORIC 40 MG tablet   Generic drug: febuxostat   Take 80 mg by mouth at bedtime.      valsartan 160 MG tablet   Commonly known as: DIOVAN   Take 160 mg by mouth daily.      VICTOZA 18 MG/3ML Soln   Generic drug: Liraglutide   Inject 1.8 mLs into the skin every morning.      warfarin 7.5 MG tablet   Commonly known as: COUMADIN   Take 5-7.5 mg by mouth daily. As directed. Take the 5mg  mon, tues, thurs, and fridays, and 7.5mg  rest of days              The results of significant diagnostics from this hospitalization (including imaging, microbiology, ancillary and laboratory) are listed  below for reference.    Significant Diagnostic Studies: Dg Chest Port 1 View  01/01/2012  *RADIOLOGY REPORT*  Clinical Data:3-day history of fever, chills, and cough.  Chronic ventilator dependent respiratory failure.  PORTABLE CHEST - 1 VIEW 01/01/2012 1435 hours:  Comparison: Two-view chest x-ray 02/16/2010 and portable chest x- ray 02/14/2009.  Findings: Tracheostomy tube tip in satisfactory position below the thoracic inlet.  Suboptimal inspiration due to body habitus which accounts for crowded bronchovascular markings at the bases and accentuates the cardiac silhouette.  Taking this into account, cardiac silhouette mildly enlarged but stable and lungs clear.  IMPRESSION: Suboptimal inspiration.  No acute cardiopulmonary disease.  Original Report Authenticated By: Arnell Sieving, [name]    Microbiology: Recent Results (from the past 240 hour(s))  CULTURE, BLOOD (ROUTINE X 2)     Status: Normal (Preliminary result)   Collection Time   01/01/12  4:05  PM      Component Value Range Status Comment   Specimen Description BLOOD ARM LEFT   Final    Special Requests BOTTLES DRAWN AEROBIC AND ANAEROBIC 10CC   Final    Culture  Setup Time 01/01/2012 20:06   Final    Culture     Final    Value:        BLOOD CULTURE RECEIVED NO GROWTH TO DATE CULTURE WILL BE HELD FOR 5 DAYS BEFORE ISSUING A FINAL NEGATIVE REPORT   Report Status PENDING   Incomplete   CULTURE, BLOOD (ROUTINE X 2)     Status: Normal (Preliminary result)   Collection Time   01/01/12  4:15 PM      Component Value Range Status Comment   Specimen Description BLOOD ARM RIGHT   Final    Special Requests     Final    Value: BOTTLES DRAWN AEROBIC AND ANAEROBIC BLUE 7CC RED 4CC   Culture  Setup Time 01/01/2012 20:06   Final    Culture     Final    Value:        BLOOD CULTURE RECEIVED NO GROWTH TO DATE CULTURE WILL BE HELD FOR 5 DAYS BEFORE ISSUING A FINAL NEGATIVE REPORT   Report Status PENDING   Incomplete   WOUND CULTURE     Status: Normal  (Preliminary result)   Collection Time   01/01/12  6:27 PM      Component Value Range Status Comment   Specimen Description WOUND PERINEAL   Final    Special Requests NONE   Final    Gram Stain     Final    Value: NO WBC SEEN     NO SQUAMOUS EPITHELIAL CELLS SEEN     NO ORGANISMS SEEN   Culture NO GROWTH 1 DAY   Final    Report Status PENDING   Incomplete   URINE CULTURE     Status: Normal   Collection Time   01/02/12  1:29 AM      Component Value Range Status Comment   Specimen Description URINE, RANDOM   Final    Special Requests NONE   Final    Culture  Setup Time 01/02/2012 05:11   Final    Colony Count NO GROWTH   Final    Culture NO GROWTH   Final    Report Status 01/03/2012 FINAL   Final   MRSA PCR SCREENING     Status: Abnormal   Collection Time   01/02/12  1:33 AM      Component Value Range Status Comment   MRSA by PCR POSITIVE (*) NEGATIVE Final      Labs: Basic Metabolic Panel:  Lab 01/02/12 1610 01/01/12 1426  NA 137 137  K 2.5* 3.8  CL 92* 92*  CO2 30 35*  GLUCOSE 268* 282*  BUN 22 19  CREATININE 0.89 0.86  CALCIUM 8.2* 8.9  MG -- --  PHOS -- --   Liver Function Tests:  Lab 01/02/12 0546 01/01/12 1426  AST 23 28  ALT 21 26  ALKPHOS 49 56  BILITOT 0.5 0.5  PROT 7.5 8.1  ALBUMIN 3.1* 3.4*   No results found for this basename: LIPASE:5,AMYLASE:5 in the last 168 hours No results found for this basename: AMMONIA:5 in the last 168 hours CBC:  Lab 01/02/12 0546 01/01/12 1426  WBC 7.6 10.0  NEUTROABS -- 7.4  HGB 10.8* 12.0  HCT 33.4* 36.2  MCV 94.1 94.8  PLT 125*  126*   Cardiac Enzymes: No results found for this basename: CKTOTAL:5,CKMB:5,CKMBINDEX:5,TROPONINI:5 in the last 168 hours BNP: No components found with this basename: POCBNP:5 CBG:  Lab 01/02/12 2141 01/02/12 1657 01/02/12 1135 01/02/12 0741 01/01/12 2139  GLUCAP 232* 278* 261* 245* 232*    Time coordinating discharge: Greater than 45 minutes  Signed:  Marinda Elk  [facility] 01/03/2012, 12:31 PM

## 2012-01-04 ENCOUNTER — Inpatient Hospital Stay (HOSPITAL_COMMUNITY): Admission: RE | Admit: 2012-01-04 | Payer: Medicare Other | Source: Ambulatory Visit

## 2012-01-04 LAB — WOUND CULTURE: Culture: NO GROWTH

## 2012-01-05 ENCOUNTER — Emergency Department (HOSPITAL_COMMUNITY): Payer: Medicare Other

## 2012-01-05 ENCOUNTER — Telehealth (INDEPENDENT_AMBULATORY_CARE_PROVIDER_SITE_OTHER): Payer: Self-pay | Admitting: General Surgery

## 2012-01-05 ENCOUNTER — Emergency Department (HOSPITAL_COMMUNITY)
Admission: EM | Admit: 2012-01-05 | Discharge: 2012-01-06 | Disposition: A | Discharge status: Home / Self Care | Payer: Medicare Other | Attending: Emergency Medicine | Admitting: Emergency Medicine

## 2012-01-05 ENCOUNTER — Encounter (HOSPITAL_COMMUNITY): Payer: Self-pay | Admitting: Emergency Medicine

## 2012-01-05 DIAGNOSIS — R509 Fever, unspecified: Secondary | ICD-10-CM | POA: Insufficient documentation

## 2012-01-05 DIAGNOSIS — Z7982 Long term (current) use of aspirin: Secondary | ICD-10-CM | POA: Insufficient documentation

## 2012-01-05 DIAGNOSIS — J45909 Unspecified asthma, uncomplicated: Secondary | ICD-10-CM | POA: Insufficient documentation

## 2012-01-05 DIAGNOSIS — R110 Nausea: Secondary | ICD-10-CM | POA: Insufficient documentation

## 2012-01-05 DIAGNOSIS — L0291 Cutaneous abscess, unspecified: Secondary | ICD-10-CM

## 2012-01-05 DIAGNOSIS — K219 Gastro-esophageal reflux disease without esophagitis: Secondary | ICD-10-CM | POA: Insufficient documentation

## 2012-01-05 DIAGNOSIS — E119 Type 2 diabetes mellitus without complications: Secondary | ICD-10-CM | POA: Insufficient documentation

## 2012-01-05 DIAGNOSIS — Z7901 Long term (current) use of anticoagulants: Secondary | ICD-10-CM | POA: Insufficient documentation

## 2012-01-05 DIAGNOSIS — Z79899 Other long term (current) drug therapy: Secondary | ICD-10-CM | POA: Insufficient documentation

## 2012-01-05 DIAGNOSIS — Z9089 Acquired absence of other organs: Secondary | ICD-10-CM | POA: Insufficient documentation

## 2012-01-05 LAB — COMPREHENSIVE METABOLIC PANEL
ALT: 34 U/L (ref 0–35)
Alkaline Phosphatase: 50 U/L (ref 39–117)
BUN: 20 mg/dL (ref 6–23)
CO2: 32 mEq/L (ref 19–32)
Calcium: 8.4 mg/dL (ref 8.4–10.5)
GFR calc Af Amer: >90 mL/min (ref >90)
GFR calc non Af Amer: 79 mL/min — ABNORMAL LOW (ref >90)
Glucose, Bld: 288 mg/dL — ABNORMAL HIGH (ref 70–99)
Potassium: 3.3 mEq/L — ABNORMAL LOW (ref 3.5–5.1)
Sodium: 137 mEq/L (ref 135–145)

## 2012-01-05 LAB — CBC WITH DIFFERENTIAL/PLATELET
Eosinophils Absolute: 0 10*3/uL (ref 0.0–0.7)
Eosinophils Relative: 0 % (ref 0–5)
Hemoglobin: 11.8 g/dL — ABNORMAL LOW (ref 12.0–15.0)
Lymphocytes Relative: 5 % — ABNORMAL LOW (ref 12–46)
Lymphs Abs: 0.7 10*3/uL (ref 0.7–4.0)
MCH: 30.9 pg (ref 26.0–34.0)
MCV: 94 fL (ref 78.0–100.0)
Monocytes Relative: 8 % (ref 3–12)
Platelets: 152 10*3/uL (ref 150–400)
RBC: 3.82 MIL/uL — ABNORMAL LOW (ref 3.87–5.11)
WBC: 15.7 10*3/uL — ABNORMAL HIGH (ref 4.0–10.5)

## 2012-01-05 LAB — URINALYSIS, ROUTINE W REFLEX MICROSCOPIC
Bilirubin Urine: NEGATIVE
Ketones, ur: NEGATIVE mg/dL
Nitrite: NEGATIVE
Urobilinogen, UA: 0.2 mg/dL (ref 0.0–1.0)

## 2012-01-05 LAB — URINE MICROSCOPIC-ADD ON

## 2012-01-05 LAB — PROTIME-INR
INR: 1.66 — ABNORMAL HIGH (ref 0.00–1.49)
Prothrombin Time: 19.9 seconds — ABNORMAL HIGH (ref 11.6–15.2)

## 2012-01-05 MED ORDER — ONDANSETRON HCL 4 MG/2ML IJ SOLN
4.0000 mg | Freq: Once | INTRAMUSCULAR | Status: AC
  Administered 2012-01-05: 4 mg via INTRAVENOUS
  Filled 2012-01-05: qty 2

## 2012-01-05 MED ORDER — DOXYCYCLINE HYCLATE 100 MG PO CAPS: 100.0000 mg | ORAL_CAPSULE | Freq: Two times a day (BID) | ORAL | Status: AC

## 2012-01-05 MED ORDER — SODIUM CHLORIDE 0.9 % IV BOLUS (SEPSIS)
1000.0000 mL | Freq: Once | INTRAVENOUS | Status: AC
  Administered 2012-01-05: 1000 mL via INTRAVENOUS

## 2012-01-05 NOTE — Telephone Encounter (Signed)
She called to say that she has had fevers and chills and nausea.  She had a recent I/D of an area of her abdominal pannus and is on flagyl only.  She says that she cannot see the wound to evaluate if any further redness or drainage because of her obesity.  I recommended that she come into the ER for evaluation because she cannot see the wound for evaluation.

## 2012-01-05 NOTE — ED Provider Notes (Signed)
History     CSN: [id number]  Arrival date & time 01/05/12  1478   First MD Initiated Contact with Patient 01/05/12 1936      Chief Complaint  Patient presents with  . Chills  . Fever  . Nausea  . Shortness of Breath    (Consider location/radiation/quality/duration/timing/severity/associated sxs/prior treatment) Patient is a 51 y.o. female presenting with fever. The history is provided by the patient.  Fever Primary symptoms of the febrile illness include fever. Primary symptoms do not include fatigue, headaches, cough, wheezing, shortness of breath, abdominal pain, nausea, vomiting, diarrhea or rash. The current episode started today. This is a new problem. The problem has not changed since onset. The maximum temperature recorded prior to her arrival was 100 to 100.9 F. The temperature was taken by an oral thermometer.  Associated with: recent hospital admission.    Past Medical History  Diagnosis Date  . OSA (obstructive sleep apnea)   . Morbid obesity   . Depressive disorder, not elsewhere classified   . Diabetes mellitus   . Asthma   . Dyslipidemia   . Clostridium difficile enterocolitis 2012  . Splenomegaly 06/01/2011  . Thrombocytopenia 06/01/2011  . Hepatosplenomegaly   . Gout   . Peripheral neuropathy   . GERD (gastroesophageal reflux disease)   . Osteoarthritis   . Polyneuropathy   . Urinary incontinence   . Amenorrhea     Past Surgical History  Procedure Date  . Cholecystectomy   . Dilation and curettage of uterus   . Tracheostomy   . Endometrial ablation   . Skin graft   . Finger surgery     ring finger on right hand  . Breast biopsy     needle core, left    Family History  Problem Relation Age of Onset  . Diabetes Father   . Heart disease Mother   . Clotting disorder Mother   . Hypertension Mother   . Hypertension Father   . Multiple myeloma Paternal Grandmother   . Heart disease Paternal Grandfather   . Cancer Maternal Grandfather   .  Kidney disease Maternal Grandmother   . Hypertension Sister     History  Substance Use Topics  . Smoking status: Never Smoker   . Smokeless tobacco: Never Used  . Alcohol Use: No     occas-once or twice a year    OB History    Grav Para Term Preterm Abortions TAB SAB Ect Mult Living   0               Review of Systems  Constitutional: Positive for fever and chills. Negative for diaphoresis and fatigue.  HENT: Negative for ear pain, congestion, sore throat, facial swelling, mouth sores, trouble swallowing, neck pain and neck stiffness.   Eyes: Negative.   Respiratory: Negative for apnea, cough, chest tightness, shortness of breath and wheezing.   Cardiovascular: Negative for chest pain, palpitations and leg swelling.  Gastrointestinal: Negative for nausea, vomiting, abdominal pain, diarrhea and abdominal distention.  Genitourinary: Negative for hematuria, flank pain, vaginal discharge, difficulty urinating and menstrual problem.  Musculoskeletal: Negative for back pain and gait problem.  Skin: Negative for rash and wound.  Neurological: Negative for dizziness, tremors, seizures, syncope, facial asymmetry, numbness and headaches.  Psychiatric/Behavioral: Negative.   All other systems reviewed and are negative.    Allergies  Molds & smuts; Allopurinol; Amoxicillin-pot clavulanate; Ciprofloxacin; Codeine; Fexofenadine; Morphine; Sulfa drugs cross reactors; and Sulfonamide derivatives  Home Medications   Current Outpatient Rx  Name Route Sig Dispense Refill  . ACETAMINOPHEN 500 MG PO TABS Oral Take 1,000 mg by mouth as needed. For pain    . ALBUTEROL SULFATE (2.5 MG/3ML) 0.083% IN NEBU Nebulization Take 2.5 mg by nebulization every 4 (four) hours as needed. For shortness of breath    . ALPRAZOLAM 0.5 MG PO TABS Oral Take 0.5 mg by mouth 2 (two) times daily as needed. For anxiety    . ASPIRIN EC 81 MG PO TBEC Oral Take 81 mg by mouth daily.    . ATORVASTATIN CALCIUM 10 MG PO  TABS Oral Take 10 mg by mouth at bedtime.     . BUDESONIDE 0.25 MG/2ML IN SUSP Nebulization Take 0.25 mg by nebulization 3 (three) times daily as needed. For shortness of breath.    . BUMETANIDE 1 MG PO TABS Oral Take 2 mg by mouth 2 (two) times daily.     . BUSPIRONE HCL 5 MG PO TABS Oral Take 7.5 mg by mouth 2 (two) times daily. \    . CHOLESTYRAMINE 4 G PO PACK Oral Take 1 packet by mouth as needed. For loose stools, IBS.    [name] DICYCLOMINE HCL 20 MG PO TABS Oral Take 20 mg by mouth 2 (two) times daily.      . DULOXETINE HCL 30 MG PO CPEP Oral Take 30 mg by mouth 2 (two) times daily.      . ERGOCALCIFEROL 50000 UNITS PO CAPS Oral Take 50,000 Units by mouth once a week. Take on Fridays    . FEBUXOSTAT 40 MG PO TABS Oral Take 80 mg by mouth at bedtime.     . FESOTERODINE FUMARATE ER 4 MG PO TB24 Oral Take 4 mg by mouth daily.      . OMEGA-3 FATTY ACIDS 1000 MG PO CAPS Oral Take 2 g by mouth daily.    [name] GABAPENTIN 100 MG PO CAPS Oral Take 100 mg by mouth 4 (four) times daily.     . GUAIFENESIN ER 600 MG PO TB12 Oral Take 1,200 mg by mouth 2 (two) times daily.      [name] HYDROCODONE-ACETAMINOPHEN 5-500 MG PO TABS Oral Take 1 tablet by mouth every 6 (six) hours as needed. For pain    . INSULIN ASPART 100 UNIT/ML Florien SOLN Subcutaneous Inject 16 Units into the skin 3 (three) times daily before meals.     . INSULIN GLARGINE 100 UNIT/ML [place] SOLN Subcutaneous Inject 70 Units into the skin 2 (two) times daily.     [name] LIRAGLUTIDE 18 MG/3ML Paden SOLN Subcutaneous Inject 1.8 mLs into the skin every morning.    [name] METOLAZONE 5 MG PO TABS Oral Take 5 mg by mouth every other day.    [name] METRONIDAZOLE 500 MG PO TABS Oral Take 1 tablet (500 mg total) by mouth every 8 (eight) hours. 21 tablet 0  . MULTIVITAMINS PO CAPS Oral Take 1 capsule by mouth daily.      . NEBIVOLOL HCL 10 MG PO TABS Oral Take 10 mg by mouth daily.      [name] OMEPRAZOLE 40 MG PO CPDR Oral Take 40 mg by mouth daily.      [name] POLYETHYLENE GLYCOL 3350 PO PACK  Oral Take 17 g by mouth daily as needed. For constipation.    [name] POTASSIUM CHLORIDE 20 MEQ PO PACK Oral Take 20 mEq by mouth 3 (three) times daily.      [name] PRIMIDONE 50 MG PO TABS Oral Take 25-75 mg by mouth 2 (two)  times daily. Take 25 MG in the morning and 75 MG at bedtime.    [name] PROBIOTIC PO CAPS Oral Take 1 capsule by mouth daily.     [name] SIMETHICONE 180 MG PO CAPS Oral Take 1 capsule by mouth daily as needed. For gas    . TOPIRAMATE 25 MG PO TABS Oral Take 1 tablet (25 mg total) by mouth daily.    [name] VALSARTAN 160 MG PO TABS Oral Take 160 mg by mouth daily.      . WARFARIN SODIUM 7.5 MG PO TABS Oral Take 5-7.5 mg by mouth daily. As directed. Take the 5mg  mon, tues, thurs, and fridays, and 7.5mg  rest of days      BP 107/50  Pulse 96  Temp 98.9 F (37.2 C) (Oral)  Resp 24  Physical Exam  Nursing note and vitals reviewed. Constitutional: She is oriented to person, place, and time. She appears well-developed and well-nourished. No distress.  HENT:  Head: Normocephalic and atraumatic.  Right Ear: External ear normal.  Left Ear: External ear normal.  Nose: Nose normal.  Mouth/Throat: Oropharynx is clear and moist. No oropharyngeal exudate.  Eyes: Conjunctivae and EOM are normal. Pupils are equal, round, and reactive to light. Right eye exhibits no discharge. Left eye exhibits no discharge.  Neck: Normal range of motion. Neck supple. No JVD present. No tracheal deviation present. No thyromegaly present.  Cardiovascular: Normal rate, regular rhythm, normal heart sounds and intact distal pulses.  Exam reveals no gallop and no friction rub.   No murmur heard. Pulmonary/Chest: Effort normal and breath sounds normal. No respiratory distress. She has no wheezes. She has no rales. She exhibits no tenderness.  Abdominal: Soft. Bowel sounds are normal. She exhibits no distension. There is no tenderness. There is no rebound and no guarding.  Musculoskeletal: Normal range of motion.  Lymphadenopathy:     She has no cervical adenopathy.  Neurological: She is alert and oriented to person, place, and time. No cranial nerve deficit. Coordination normal.  Skin: Skin is warm. No rash noted. She is not diaphoretic.       The 2 cellulitic abscesses were investigated and appeared less injury to less painful less warm and to be healing without fluctuance purulence.  Psychiatric: She has a normal mood and affect. Her behavior is normal. Judgment and thought content normal.    ED Course  Procedures (including critical care time)   Labs Reviewed  CBC WITH DIFFERENTIAL  COMPREHENSIVE METABOLIC PANEL  URINALYSIS, ROUTINE W REFLEX MICROSCOPIC  PROTIME-INR   No results found.   No diagnosis found.    MDM  51 year old female with morbid obesity who has a tracheostomy for chronic disease from sleep apnea who was recently here for cellulitis and abscess and given IV antibiotics presents with feelings of chills nausea and reported fever of 101 at home. Patient says she's been compliant with her antibiotics and feels that the pain associated with metastases is improving. Per my physical exam above the incisions appear to be healing well with no signs of continued infection. Patient not complaining of chest pain abdominal pain nausea vomiting. Patient does state she feels slightly short of breath. Concern for possible urinary tract infection versus nosocomial pneumonia but otherwise patient appears hemodynamically stable and well. Will screen with labs EKG chest x-ray and urine. We'll also give patient Zofran and IV fluids.  Results for orders placed during the hospital encounter of 01/05/12  CBC WITH DIFFERENTIAL      Component Value Range  WBC 15.7 (*) 4.0 - 10.5 K/uL   RBC 3.82 (*) 3.87 - 5.11 MIL/uL   Hemoglobin 11.8 (*) 12.0 - 15.0 g/dL   HCT 16.1 (*) 09.6 - 04.5 %   MCV 94.0  78.0 - 100.0 fL   MCH 30.9  26.0 - 34.0 pg   MCHC 32.9  30.0 - 36.0 g/dL   RDW 40.9  81.1 - 91.4 %   Platelets 152  150  - 400 K/uL   Neutrophils Relative 88 (*) 43 - 77 %   Neutro Abs 13.8 (*) 1.7 - 7.7 K/uL   Lymphocytes Relative 5 (*) 12 - 46 %   Lymphs Abs 0.7  0.7 - 4.0 K/uL   Monocytes Relative 8  3 - 12 %   Monocytes Absolute 1.2 (*) 0.1 - 1.0 K/uL   Eosinophils Relative 0  0 - 5 %   Eosinophils Absolute 0.0  0.0 - 0.7 K/uL   Basophils Relative 0  0 - 1 %   Basophils Absolute 0.0  0.0 - 0.1 K/uL  COMPREHENSIVE METABOLIC PANEL      Component Value Range   Sodium 137  135 - 145 mEq/L   Potassium 3.3 (*) 3.5 - 5.1 mEq/L   Chloride 94 (*) 96 - 112 mEq/L   CO2 32  19 - 32 mEq/L   Glucose, Bld 288 (*) 70 - 99 mg/dL   BUN 20  6 - 23 mg/dL   Creatinine, Ser 7.82  0.50 - 1.10 mg/dL   Calcium 8.4  8.4 - 95.6 mg/dL   Total Protein 7.9  6.0 - 8.3 g/dL   Albumin 3.3 (*) 3.5 - 5.2 g/dL   AST 46 (*) 0 - 37 U/L   ALT 34  0 - 35 U/L   Alkaline Phosphatase 50  39 - 117 U/L   Total Bilirubin 0.3  0.3 - 1.2 mg/dL   GFR calc non Af Amer 79 (*) >90 mL/min   GFR calc Af Amer >90  >90 mL/min  URINALYSIS, ROUTINE W REFLEX MICROSCOPIC      Component Value Range   Color, Urine YELLOW  YELLOW   APPearance CLEAR  CLEAR   Specific Gravity, Urine 1.019  1.005 - 1.030   pH 6.0  5.0 - 8.0   Glucose, UA NEGATIVE  NEGATIVE mg/dL   Hgb urine dipstick NEGATIVE  NEGATIVE   Bilirubin Urine NEGATIVE  NEGATIVE   Ketones, ur NEGATIVE  NEGATIVE mg/dL   Protein, ur NEGATIVE  NEGATIVE mg/dL   Urobilinogen, UA 0.2  0.0 - 1.0 mg/dL   Nitrite NEGATIVE  NEGATIVE   Leukocytes, UA TRACE (*) NEGATIVE  PROTIME-INR      Component Value Range   Prothrombin Time 19.9 (*) 11.6 - 15.2 seconds   INR 1.66 (*) 0.00 - 1.49  URINE MICROSCOPIC-ADD ON      Component Value Range   Squamous Epithelial / LPF FEW (*) RARE   WBC, UA 0-2  <3 WBC/hpf   RBC / HPF 0-2  <3 RBC/hpf   Bacteria, UA RARE  RARE   Casts HYALINE CASTS (*) NEGATIVE       DG Chest 2 View (Final result)   Result time:01/05/12 2121    Final result by Rad Results In  Interface (01/05/12 21:21:28)    Narrative:   *RADIOLOGY REPORT*  Clinical Data: Fever, chills, nausea Shortness of breath. Diabetic.  CHEST - 2 VIEW  Comparison: 01/01/2012, 02/16/2010 and 08/29/2008.  Findings: Tracheostomy tube tip midline.  Evaluation limited by  patient's habitus and respiratory motion.  Cardiomegaly and pulmonary vascular prominence / congestion.  Peribronchial thickening. There is partially chronic in origin although superimposed bronchitis not excluded.  No gross pneumothorax. Limited for detection of subtle pneumothorax.  IMPRESSION: Tracheostomy tube tip midline.  Evaluation limited by patient's habitus and respiratory motion.  Cardiomegaly and pulmonary vascular prominence / congestion.  Peribronchial thickening. There is partially chronic in origin although superimposed bronchitis not excluded.  No segmental consolidation.  Original Report Authenticated By: Fuller Canada, [name]     On reassessment patient denies worsening cough diarrhea shortness of breath or rashes. Per the inpatient note patient was to be discharged with doxycycline and Flagyl for her skin infections of that she did not get prescription for doxycycline for her memory. Patient is only taking Flagyl. Soft tissues x-rays appear to be improving. Given her normal evaluation for urinary tract infection and pneumonia in her hemodynamic stability and well appearance will prescribed her doxycycline for 2 weeks and will have her followup with her PCP in 2 days for reexamination.   Case discussed with Dr. Anastasia Fiedler, MD 01/05/12 2308

## 2012-01-05 NOTE — ED Notes (Signed)
Per MD provided pt with drink. Pt tolerating well.

## 2012-01-05 NOTE — ED Notes (Signed)
The patient states that she started feeling chills, a fever, nausea, and shortness of breath between 3 - 4 hours ago while sitting in her recliner.  The patient states she has been defecating more than usual today and that her stool has been "a little loose."  The patient states that she had a vaginal abscess lanced here at [facility] on 01/01/12, and she thinks the abscess may have grown back to its original size.

## 2012-01-07 LAB — CULTURE, BLOOD (ROUTINE X 2): Culture: NO GROWTH

## 2012-01-08 ENCOUNTER — Telehealth: Payer: Self-pay | Admitting: Pulmonary Disease

## 2012-01-08 DIAGNOSIS — E678 Other specified hyperalimentation: Secondary | ICD-10-CM

## 2012-01-08 NOTE — Telephone Encounter (Signed)
Pt called & stated that Onalee Hua w/ [facility] was to have heard something back this am.  Onalee Hua was to be @ pt's home by 3:00 pm today.  Pt is very upset that no one has spoken w/ Onalee Hua & given orders for tidal volume & respiratory rate.  Pt can be reached at [phone].  Antionette Fairy

## 2012-01-08 NOTE — Telephone Encounter (Signed)
Called, spoke with pt.  I apologized for this incovienance and advised I would page Dr. Craige Cotta to address.  This. She verbalized understanding.    Dr. Craige Cotta, pls advise.

## 2012-01-08 NOTE — Telephone Encounter (Signed)
This is a duplicate msg - pls see prior phone msg from today where Theodoro Grist called.

## 2012-01-08 NOTE — ED Provider Notes (Signed)
I saw and evaluated the patient, reviewed the resident's note and I agree with the findings and plan. Pt s/p recent I and D abscess. subj fever. abd soft nt. Abscess draining, no remaining fluctuance. No abd wall cellulitis. abd soft nt.   Suzi Roots, MD 01/08/12 [phone]

## 2012-01-08 NOTE — Telephone Encounter (Signed)
Called, spoke with Onalee Hua with [facility] who states pt is on vent qhs.  States this is set with pressure support but has no rate set.  Reports pt has completely stopped breathing the past few nights - vent has went into apnea mode.  He is requesting orders for a tidal volume and respiratory rate.  Dr. Craige Cotta, pls advise. Thank you.

## 2012-01-08 NOTE — Telephone Encounter (Signed)
Spoke with Onalee Hua, RT with [facility].  I informed him of below per Dr. Craige Cotta.  He verbalized understanding of this and is requesting this to be sent through DME order to [facility].  Nothing further needed at this time.  Order has been placed.    Called, spoke with pt.  Jaclyn Prime with [facility] has been given settings.  She verbalized understanding of this and voiced no further questions/concerns at this time.

## 2012-01-08 NOTE — Telephone Encounter (Signed)
Back up respiratory rate 12, tidal volume 510.

## 2012-01-16 ENCOUNTER — Ambulatory Visit (INDEPENDENT_AMBULATORY_CARE_PROVIDER_SITE_OTHER): Payer: Medicare Other | Admitting: General Surgery

## 2012-01-16 ENCOUNTER — Encounter (INDEPENDENT_AMBULATORY_CARE_PROVIDER_SITE_OTHER): Payer: Self-pay | Admitting: General Surgery

## 2012-01-16 VITALS — BP 136/78 | HR 76 | Temp 97.9°F | Resp 20 | Ht 68.0 in | Wt >= 6400 oz

## 2012-01-16 DIAGNOSIS — L0291 Cutaneous abscess, unspecified: Secondary | ICD-10-CM

## 2012-01-16 DIAGNOSIS — L0390 Cellulitis, unspecified: Secondary | ICD-10-CM

## 2012-01-16 NOTE — Progress Notes (Signed)
Subjective:     Patient ID: [name], female   DOB: 1960/08/31, 51 y.o.   MRN: [id number]  HPI Patient is status post incision and drainage of abscess on the soft tissue above hormones. She was hospitalized for intravenous antibiotics. She is improved. She's doing daily dressing changes with iodoform packing. She feels better.  Review of Systems     Objective:   Physical Exam Wound is significantly improved. Packing was removed. There is no residual induration or fluctuance. No purulent drainage or cellulitis. A plain gauze dressing was applied.    Assessment:     Significant improvement status post incision and drainage of the abscess    Plan:     Prone gauze dressing daily and to her wound heals. Complete course of doxycycline. Patient is cleared from my standpoint to undergo tracheal dilatation scheduled with Dr. Jearld Fenton in August.

## 2012-01-16 NOTE — Patient Instructions (Addendum)
Stop doing the packing. Cover area with dry gauze until healed. Cleared for tracheal dilatation. I will contact Dr. Jearld Fenton.

## 2012-01-21 ENCOUNTER — Encounter (HOSPITAL_COMMUNITY): Payer: Self-pay | Admitting: Respiratory Therapy

## 2012-01-21 ENCOUNTER — Encounter (HOSPITAL_COMMUNITY)
Admission: RE | Admit: 2012-01-21 | Discharge: 2012-01-21 | Disposition: A | Discharge status: Home / Self Care | Payer: Medicare Other | Source: Ambulatory Visit | Attending: Otolaryngology | Admitting: Otolaryngology

## 2012-01-21 ENCOUNTER — Encounter (HOSPITAL_COMMUNITY): Payer: Self-pay

## 2012-01-21 ENCOUNTER — Ambulatory Visit (HOSPITAL_COMMUNITY)
Admission: RE | Admit: 2012-01-21 | Discharge: 2012-01-21 | Disposition: A | Discharge status: Home / Self Care | Payer: Medicare Other | Source: Ambulatory Visit | Attending: Otolaryngology | Admitting: Otolaryngology

## 2012-01-21 DIAGNOSIS — Z01818 Encounter for other preprocedural examination: Secondary | ICD-10-CM | POA: Insufficient documentation

## 2012-01-21 DIAGNOSIS — Z930 Tracheostomy status: Secondary | ICD-10-CM | POA: Insufficient documentation

## 2012-01-21 DIAGNOSIS — Z01812 Encounter for preprocedural laboratory examination: Secondary | ICD-10-CM | POA: Insufficient documentation

## 2012-01-21 HISTORY — DX: Cardiac arrhythmia, unspecified: I49.9

## 2012-01-21 HISTORY — DX: Anxiety disorder, unspecified: F41.9

## 2012-01-21 HISTORY — DX: Shortness of breath: R06.02

## 2012-01-21 HISTORY — DX: Angina pectoris, unspecified: I20.9

## 2012-01-21 HISTORY — DX: Chronic kidney disease, unspecified: N18.9

## 2012-01-21 HISTORY — DX: Other specified postprocedural states: Z98.890

## 2012-01-21 HISTORY — DX: Nausea with vomiting, unspecified: R11.2

## 2012-01-21 HISTORY — DX: Pneumonia, unspecified organism: J18.9

## 2012-01-21 LAB — CBC
HCT: 38.2 % (ref 36.0–46.0)
Hemoglobin: 12.6 g/dL (ref 12.0–15.0)
MCV: 92.7 fL (ref 78.0–100.0)
RBC: 4.12 MIL/uL (ref 3.87–5.11)
RDW: 14.9 % (ref 11.5–15.5)
WBC: 8.9 10*3/uL (ref 4.0–10.5)

## 2012-01-21 LAB — BASIC METABOLIC PANEL
CO2: 32 mEq/L (ref 19–32)
Chloride: 93 mEq/L — ABNORMAL LOW (ref 96–112)
Creatinine, Ser: 0.79 mg/dL (ref 0.50–1.10)
GFR calc Af Amer: >90 mL/min (ref >90)
Potassium: 3.4 mEq/L — ABNORMAL LOW (ref 3.5–5.1)
Sodium: 137 mEq/L (ref 135–145)

## 2012-01-21 LAB — APTT: aPTT: 29 seconds (ref 24–37)

## 2012-01-21 LAB — SURGICAL PCR SCREEN
MRSA, PCR: NEGATIVE
Staphylococcus aureus: NEGATIVE

## 2012-01-21 LAB — PROTIME-INR: INR: 1.45 (ref 0.00–1.49)

## 2012-01-21 NOTE — Progress Notes (Signed)
Requested any information available from [facility].  Sleep Study requested from [facility].

## 2012-01-21 NOTE — Pre-Procedure Instructions (Signed)
[address]ERRA[address] PANAGOPOULOS  01/21/2012   Your procedure is scheduled on:  01-24-2012  Report to [facility] at 5:30 AM.  Call this number if you have problems the morning of surgery: [phone]   Remember:   Do not eat food or drink:After Midnight   Take these medicines the morning of surgery with A SIP OF WATER: tylenol as needed, nebulizer as needed,xanax as needed,uloric,colchicine,cymbalta,toviaz,neurotin,mucines,pain medication as needed,claritin as needed,flagyl,bystolic,macrodantin,mysoline,topamax,   Do not wear jewelry, make-up or nail polish.  Do not wear lotions, powders, or perfumes. You may wear deodorant.  Do not shave 48 hours prior to surgery. Men may shave face and neck.  Do not bring valuables to the hospital.  Contacts, dentures or bridgework may not be worn into surgery.  Leave suitcase in the car. After surgery it may be brought to your room.  For patients admitted to the hospital, checkout time is 11:00 AM the day of discharge.   Patients discharged the day of surgery will not be allowed to drive home.  Name and phone number of your driver:   Special Instructions: CHG Shower Use Special Wash: 1/2 bottle night before surgery and 1/2 bottle morning of surgery.   Please read over the following fact sheets that you were given: Pain Booklet and MRSA Information

## 2012-01-21 NOTE — Consult Note (Addendum)
Anesthesia Consult:  Patient is a 51 year old female posted for tracheal dilatation, possible exchange trach size from 6 to size 7 uncuffed on 01/24/12 by Dr. Jearld Fenton.    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  She has been evaluated by Cardiologist Dr. Lynnea Ferrier formerly of [facility] in August 2010 for chest pain and inverted T waves in septal leads.  She had an echo then.  By notes, he did no recommend cardiac cath at that time unless she had persistent chest pain symptoms.  Records have been requested.  She now sees Dr. Royann Shivers.  Echo on 02/15/09 showed: 1. Left ventricle: The cavity size was normal. Wall thickness was increased in a pattern of mild LVH. Systolic function was normal. The estimated ejection fraction was in the range of 50% to 55%. Regional wall motion abnormalities cannot be excluded. Left ventricular diastolic function parameters were normal. 2. Mitral valve: Mild regurgitation. Impressions: - Technically limited study with limited views 2' to obesity. LV function appears well preserved. No significant valvular disease seen.  EKG on 01/01/12 showed SR, borderline anterior T wave abnormality, borderline prolonged QT interval.  Her anterior T wave changes have been present since at least August 2010.  She denies any recent chest or SOB at rest.  She does get dyspnea with minimal exertion which is not new for her.  She feels at her baseline.    CXR on 01/21/12 showed no active cardiopulmonary disease. Stable tracheostomy tube.   Labs noted.  Repeat PT/INR on arrival.  I updated Anesthesiologist Dr. Jacklynn Bue of patieint's history.  I'll follow-up Cardiology records when  available.  Shonna Chock, PA-C 01/21/12 1130  Addendum: 01/22/12 1615 Reviewed [facility] notes.  She was last seen by Dr. Royann Shivers on 03/23/11.  He did not recommend any changes to her medical therapy.  Anticipate she can proceed as planned.  She will be evaluated by her Anesthesiologist on the day of surgery.

## 2012-01-24 ENCOUNTER — Ambulatory Visit (HOSPITAL_COMMUNITY): Payer: Medicare Other | Admitting: Vascular Surgery

## 2012-01-24 ENCOUNTER — Encounter (HOSPITAL_COMMUNITY)
Admission: RE | Disposition: A | Discharge status: Home / Self Care | Payer: Self-pay | Source: Ambulatory Visit | Attending: Otolaryngology

## 2012-01-24 ENCOUNTER — Encounter (HOSPITAL_COMMUNITY): Payer: Self-pay | Admitting: Vascular Surgery

## 2012-01-24 ENCOUNTER — Encounter (HOSPITAL_COMMUNITY): Payer: Self-pay | Admitting: *Deleted

## 2012-01-24 ENCOUNTER — Ambulatory Visit (HOSPITAL_COMMUNITY)
Admission: RE | Admit: 2012-01-24 | Discharge: 2012-01-24 | Disposition: A | Discharge status: Home / Self Care | Payer: Medicare Other | Source: Ambulatory Visit | Attending: Otolaryngology | Admitting: Otolaryngology

## 2012-01-24 DIAGNOSIS — Z930 Tracheostomy status: Secondary | ICD-10-CM | POA: Insufficient documentation

## 2012-01-24 DIAGNOSIS — G4733 Obstructive sleep apnea (adult) (pediatric): Secondary | ICD-10-CM

## 2012-01-24 HISTORY — PX: TRACHEAL DILITATION: SHX5068

## 2012-01-24 LAB — PROTIME-INR
INR: 1.1 (ref 0.00–1.49)
Prothrombin Time: 14.4 seconds (ref 11.6–15.2)

## 2012-01-24 LAB — GLUCOSE, CAPILLARY
Glucose-Capillary: 293 mg/dL — ABNORMAL HIGH (ref 70–99)
Glucose-Capillary: 229 mg/dL — ABNORMAL HIGH (ref 70–99)

## 2012-01-24 SURGERY — DILATION, TRACHEA
Anesthesia: Monitor Anesthesia Care | Site: Neck | Wound class: Clean Contaminated

## 2012-01-24 MED ORDER — LIDOCAINE-EPINEPHRINE 1 %-1:100000 IJ SOLN
INTRAMUSCULAR | Status: AC
  Filled 2012-01-24: qty 1

## 2012-01-24 MED ORDER — OXYMETAZOLINE HCL 0.05 % NA SOLN
NASAL | Status: AC
  Filled 2012-01-24: qty 15

## 2012-01-24 MED ORDER — LIDOCAINE HCL (CARDIAC) 20 MG/ML IV SOLN
INTRAVENOUS | Status: DC | PRN
  Administered 2012-01-24: 50 mg via INTRAVENOUS

## 2012-01-24 MED ORDER — LIDOCAINE HCL 4 % MT SOLN
OROMUCOSAL | Status: DC | PRN
  Administered 2012-01-24: 4 mL via TOPICAL

## 2012-01-24 MED ORDER — PROPOFOL 10 MG/ML IV EMUL
INTRAVENOUS | Status: DC | PRN
  Administered 2012-01-24: 20 mg via INTRAVENOUS
  Administered 2012-01-24: 30 mg via INTRAVENOUS

## 2012-01-24 MED ORDER — LACTATED RINGERS IV SOLN
INTRAVENOUS | Status: DC | PRN
  Administered 2012-01-24: 08:00:00 via INTRAVENOUS

## 2012-01-24 SURGICAL SUPPLY — 25 items
BALLN PULM 15 16.5 18X75 (BALLOONS)
BALLOON PULM 15 16.5 18X75 (BALLOONS) IMPLANT
CANISTER SUCTION 2500CC (MISCELLANEOUS) IMPLANT
CLOTH BEACON ORANGE TIMEOUT ST (SAFETY) ×2 IMPLANT
CONT SPEC 4OZ CLIKSEAL STRL BL (MISCELLANEOUS) IMPLANT
COVER TABLE BACK 60X90 (DRAPES) ×2 IMPLANT
DRAPE PROXIMA HALF (DRAPES) IMPLANT
GLOVE ECLIPSE 7.5 STRL STRAW (GLOVE) ×2 IMPLANT
GUARD TEETH (MISCELLANEOUS) IMPLANT
KIT BASIN OR (CUSTOM PROCEDURE TRAY) ×2 IMPLANT
KIT ROOM TURNOVER OR (KITS) ×2 IMPLANT
MARKER SKIN DUAL TIP RULER LAB (MISCELLANEOUS) IMPLANT
NEEDLE HYPO 25GX1X1/2 BEV (NEEDLE) IMPLANT
NS IRRIG 1000ML POUR BTL (IV SOLUTION) IMPLANT
PAD ARMBOARD 7.5X6 YLW CONV (MISCELLANEOUS) ×2 IMPLANT
PATTIES SURGICAL .5 X3 (DISPOSABLE) IMPLANT
SOLUTION ANTI FOG 6CC (MISCELLANEOUS) IMPLANT
SPECIMEN JAR SMALL (MISCELLANEOUS) IMPLANT
SPONGE GAUZE 4X4 12PLY (GAUZE/BANDAGES/DRESSINGS) IMPLANT
SYR CONTROL 10ML LL (SYRINGE) IMPLANT
SYR INFLATE BILIARY GAUGE (MISCELLANEOUS) IMPLANT
SYR TB 1ML LUER SLIP (SYRINGE) IMPLANT
TOWEL OR 17X24 6PK STRL BLUE (TOWEL DISPOSABLE) ×2 IMPLANT
TUBE CONNECTING 12X1/4 (SUCTIONS) ×2 IMPLANT
WATER STERILE IRR 1000ML POUR (IV SOLUTION) IMPLANT

## 2012-01-24 NOTE — H&P (Signed)
[name] is an 51 y.o. female.   Chief Complaint: trach change HPI: she has OSA and treated with trach. She wants a larger trach and here for change and dilation  Past Medical History  Diagnosis Date  . OSA (obstructive sleep apnea)   . Morbid obesity   . Depressive disorder, not elsewhere classified   . Diabetes mellitus   . Asthma   . Dyslipidemia   . Clostridium difficile enterocolitis 2012  . Splenomegaly 06/01/2011  . Thrombocytopenia 06/01/2011  . Hepatosplenomegaly   . Gout   . GERD (gastroesophageal reflux disease)   . Osteoarthritis   . Polyneuropathy   . Urinary incontinence   . Amenorrhea   . CHF (congestive heart failure)   . Hyperlipidemia   . Leg swelling   . Nausea   . Weakness   . Palpitations   . PONV (postoperative nausea and vomiting)   . Anginal pain     no chest pain in years  . Dysrhythmia     heart palpations  . Anxiety   . Pneumonia   . Shortness of breath   . Chronic kidney disease     overactive bladder  . Peripheral neuropathy     Past Surgical History  Procedure Date  . Cholecystectomy   . Tracheostomy   . Endometrial ablation   . Skin graft   . Finger surgery     ring finger on right hand  . Esophageal dilation 2003  . Breast biopsy     needle core, left  . Dilation and curettage of uterus     times 2    Family History  Problem Relation Age of Onset  . Diabetes Father   . Hypertension Father   . Dementia Father   . Pneumonia Father   . Heart disease Mother   . Clotting disorder Mother   . Hypertension Mother   . Heart attack Mother   . Multiple myeloma Paternal Grandmother   . Cancer Paternal Grandmother     multiple myoloma  . Heart disease Paternal Grandfather   . Kidney disease Maternal Grandmother   . Hypertension Sister   . Cancer Paternal Aunt     breast  . Cancer Cousin     breast - all three   Social History:  reports that she has never smoked. She has never used smokeless tobacco. She reports that  she drinks alcohol. She reports that she does not use illicit drugs.  Allergies:  Allergies  Allergen Reactions  . Molds & Smuts Shortness Of Breath and Rash  . Nitroglycerin Er Nausea And Vomiting  . Allopurinol     GI  . Amoxicillin-Pot Clavulanate     unknown  . Ciprofloxacin     GI problems  . Codeine     Heart problems  . Fexofenadine     Hurts joints  . Morphine     unknown  . Sulfa Drugs Cross Reactors Nausea And Vomiting    nausea  . Sulfonamide Derivatives     nausea    Medications Prior to Admission  Medication Sig Dispense Refill  . acetaminophen (TYLENOL) 500 MG tablet Take 1,000 mg by mouth as needed. For pain      . ALPRAZolam (XANAX) 0.5 MG tablet Take 0.5 mg by mouth 2 (two) times daily as needed. For anxiety      . amoxicillin-clavulanate (AUGMENTIN) 500-125 MG per tablet Take 1 tablet by mouth 3 (three) times daily. For 7 days      .  aspirin EC 81 MG tablet Take 81 mg by mouth daily.      [name] atorvastatin (LIPITOR) 10 MG tablet Take 10 mg by mouth at bedtime.       . B Complex Vitamins (VITAMIN B COMPLEX PO) Take by mouth daily.      . bumetanide (BUMEX) 1 MG tablet Take 2 mg by mouth 2 (two) times daily.       . busPIRone (BUSPAR) 5 MG tablet Take 7.5 mg by mouth 2 (two) times daily. \      . cholestyramine (QUESTRAN) 4 G packet Take 1 packet by mouth as needed. For loose stools, IBS.      [name] colchicine 0.6 MG tablet Take 0.6 mg by mouth daily.      [name] dicyclomine (BENTYL) 20 MG tablet Take 20 mg by mouth 2 (two) times daily.        . DULoxetine (CYMBALTA) 30 MG capsule Take 30 mg by mouth 2 (two) times daily.        . ergocalciferol (VITAMIN D2) 50000 UNITS capsule Take 50,000 Units by mouth once a week. Take on Fridays      . Febuxostat (ULORIC) 80 MG TABS Take by mouth daily.      . fesoterodine (TOVIAZ) 4 MG TB24 Take 4 mg by mouth daily.        . fish oil-omega-3 fatty acids 1000 MG capsule Take 2 g by mouth daily.      [name] gabapentin (NEURONTIN) 100 MG  capsule Take 100 mg by mouth 4 (four) times daily.       [name] guaiFENesin (MUCINEX) 600 MG 12 hr tablet Take 1,200 mg by mouth 2 (two) times daily.        [name] HYDROcodone-acetaminophen (VICODIN) 5-500 MG per tablet Take 1 tablet by mouth every 6 (six) hours as needed. For pain      . insulin aspart (NOVOLOG) 100 UNIT/ML injection Inject 16 Units into the skin 3 (three) times daily before meals.       . insulin glargine (LANTUS) 100 UNIT/ML injection Inject 70 Units into the skin 2 (two) times daily.       . Liraglutide (VICTOZA) 18 MG/3ML SOLN Inject 1.8 mLs into the skin every morning.      . meclizine (ANTIVERT) 25 MG tablet Take 25 mg by mouth daily.      . metolazone (ZAROXOLYN) 5 MG tablet Take 5 mg by mouth every other day.      . metroNIDAZOLE (FLAGYL) 500 MG tablet Take 500 mg by mouth 3 (three) times daily. Take for 7 days. First dose on 01/19/12      . Multiple Vitamin (MULTIVITAMIN) capsule Take 1 capsule by mouth daily.        . nebivolol (BYSTOLIC) 10 MG tablet Take 10 mg by mouth daily.        . nitrofurantoin (MACRODANTIN) 100 MG capsule Take 100 mg by mouth 2 (two) times daily. Take for 7 days. First dose on 01/19/12      . omeprazole (PRILOSEC) 40 MG capsule Take 40 mg by mouth daily.        . potassium chloride SA (K-DUR,KLOR-CON) 20 MEQ tablet Take 20 mEq by mouth 2 (two) times daily.      . primidone (MYSOLINE) 50 MG tablet Take 25-75 mg by mouth 2 (two) times daily. Take 25 MG in the morning and 75 MG at bedtime.      [name] PROBIOTIC CAPS Take 1 capsule by mouth daily.       [name]  topiramate (TOPAMAX) 25 MG tablet Take 1 tablet (25 mg total) by mouth daily.      . valsartan (DIOVAN) 160 MG tablet Take 160 mg by mouth daily.        [name] warfarin (COUMADIN) 7.5 MG tablet Take 5-7.5 mg by mouth daily. As directed. Take the 5mg  mon, tues, thurs, and fridays, and 7.5mg  rest of days      . albuterol (PROVENTIL) (2.5 MG/3ML) 0.083% nebulizer solution Take 2.5 mg by nebulization every 4 (four) hours as  needed. For shortness of breath      . budesonide (PULMICORT) 0.25 MG/2ML nebulizer solution Take 0.25 mg by nebulization 3 (three) times daily as needed. For shortness of breath.      . loratadine (CLARITIN) 10 MG tablet Take 10 mg by mouth daily as needed.       . polyethylene glycol (MIRALAX / GLYCOLAX) packet Take 17 g by mouth daily as needed. For constipation.      . Simethicone 180 MG CAPS Take 1 capsule by mouth daily as needed. For gas        Results for orders placed during the hospital encounter of 01/24/12 (from the past 48 hour(s))  GLUCOSE, CAPILLARY     Status: Abnormal   Collection Time   01/24/12  6:06 AM      Component Value Range Comment   Glucose-Capillary 293 (*) 70 - 99 mg/dL    No results found.  Review of Systems  Constitutional: Negative.   HENT: Negative.   Eyes: Negative.   Skin: Negative.     Blood pressure 117/62, pulse 82, temperature 97.5 F (36.4 C), temperature source Oral, resp. rate 20, SpO2 92.00%. Physical Exam  Constitutional: She appears well-nourished.  HENT:  Nose: Nose normal.  Mouth/Throat: Oropharynx is clear and moist.       Trach in place  Eyes: Pupils are equal, round, and reactive to light.  Neck: Neck supple.  Cardiovascular: Normal rate.   Respiratory: Effort normal.     Assessment/Plan OSA- here for trach change and dilation  Tonio Seider 01/24/2012, 7:02 AM

## 2012-01-24 NOTE — Op Note (Signed)
Preop/postop diagnoses: Obstructive sleep apnea and trach dependence Procedure: Change of tracheotomy tube from #6 to a #7 Anesthesia: Local with sedation Estimated blood loss: 0 Indications 51 year old with a history of morbid obesity and obstructive sleep apnea that now has a #6 tracheotomy long Shiley tube at she wants to have a #7 that she feels like there is too much leak around the #6. She was changed to a #6 previously in her home or in the emergency room at some point. He otherwise is doing well with the trach with no complications. She was informed risks, benefits, and options and all questions are answered and consent was obtained. Operation: Patient was taken to the operating room placed in the supine position and after some sedation the #6 Shiley was removed without difficulty. The #7 with the obturator was placed through the stoma without difficulty. She had no evidence of significant granulation tissue in the stoma was well epithelialized. The #7 went into the trachea very nicely and without difficulty. The patient had a good airway and her speaking valve was replaced and she had a good voice. She was then taken to recovery in stable condition counts correct.

## 2012-01-24 NOTE — Anesthesia Postprocedure Evaluation (Signed)
  Anesthesia Post-op Note  Patient: [name]  Procedure(s) Performed: Procedure(s) (LRB): TRACHEAL DILITATION (N/A)  Patient Location: PACU  Anesthesia Type: MAC  Level of Consciousness: awake and alert   Airway and Oxygen Therapy: Patient Spontanous Breathing  Post-op Pain: none  Post-op Assessment: Post-op Vital signs reviewed, Patient's Cardiovascular Status Stable, Respiratory Function Stable, Patent Airway, No signs of Nausea or vomiting and Pain level controlled  Post-op Vital Signs: stable  Complications: No apparent anesthesia complications

## 2012-01-24 NOTE — Transfer of Care (Signed)
Immediate Anesthesia Transfer of Care Note  Patient: [name]  Procedure(s) Performed: Procedure(s) (LRB): TRACHEAL DILITATION (N/A)  Patient Location: PACU  Anesthesia Type: MAC  Level of Consciousness: awake, alert , oriented and patient cooperative  Airway & Oxygen Therapy: Patient Spontanous Breathing and Patient connected to face mask oxygen  Post-op Assessment: Report given to PACU RN, Post -op Vital signs reviewed and stable and Patient moving all extremities X 4  Post vital signs: Reviewed and stable  Complications: No apparent anesthesia complications

## 2012-01-24 NOTE — Anesthesia Preprocedure Evaluation (Signed)
Anesthesia Evaluation  Patient identified by MRN, date of birth, ID band Patient awake    Reviewed: Allergy & Precautions, H&P , NPO status , Patient's Chart, lab work & pertinent test results  Airway Mallampati: III TM Distance: >3 FB Neck ROM: Full    Dental   Pulmonary shortness of breath, asthma , sleep apnea ,  + rhonchi   + decreased breath sounds      Cardiovascular hypertension, + angina + dysrhythmias     Neuro/Psych Anxiety Depression  Neuromuscular disease    GI/Hepatic GERD-  ,  Endo/Other  Morbid obesity  Renal/GU      Musculoskeletal   Abdominal (+) + obese,   Peds  Hematology   Anesthesia Other Findings Trach in place  Reproductive/Obstetrics                           Anesthesia Physical Anesthesia Plan  ASA: III  Anesthesia Plan: MAC   Post-op Pain Management:    Induction: Intravenous  Airway Management Planned: Tracheostomy and Simple Face Mask  Additional Equipment:   Intra-op Plan:   Post-operative Plan:   Informed Consent: I have reviewed the patients History and Physical, chart, labs and discussed the procedure including the risks, benefits and alternatives for the proposed anesthesia with the patient or authorized representative who has indicated his/her understanding and acceptance.     Plan Discussed with: CRNA and Surgeon  Anesthesia Plan Comments: (Existing trach in place)        Anesthesia Quick Evaluation

## 2012-01-25 ENCOUNTER — Encounter (HOSPITAL_COMMUNITY): Payer: Self-pay | Admitting: Otolaryngology

## 2012-01-31 ENCOUNTER — Encounter (HOSPITAL_COMMUNITY): Payer: Self-pay | Admitting: *Deleted

## 2012-01-31 ENCOUNTER — Emergency Department (HOSPITAL_COMMUNITY)
Admission: EM | Admit: 2012-01-31 | Discharge: 2012-01-31 | Disposition: A | Discharge status: Home / Self Care | Payer: Medicare Other | Attending: Emergency Medicine | Admitting: Emergency Medicine

## 2012-01-31 ENCOUNTER — Emergency Department (HOSPITAL_COMMUNITY): Payer: Medicare Other

## 2012-01-31 DIAGNOSIS — Z7982 Long term (current) use of aspirin: Secondary | ICD-10-CM | POA: Insufficient documentation

## 2012-01-31 DIAGNOSIS — J45909 Unspecified asthma, uncomplicated: Secondary | ICD-10-CM | POA: Insufficient documentation

## 2012-01-31 DIAGNOSIS — R059 Cough, unspecified: Secondary | ICD-10-CM | POA: Insufficient documentation

## 2012-01-31 DIAGNOSIS — E785 Hyperlipidemia, unspecified: Secondary | ICD-10-CM | POA: Insufficient documentation

## 2012-01-31 DIAGNOSIS — R531 Weakness: Secondary | ICD-10-CM

## 2012-01-31 DIAGNOSIS — I509 Heart failure, unspecified: Secondary | ICD-10-CM | POA: Insufficient documentation

## 2012-01-31 DIAGNOSIS — R002 Palpitations: Secondary | ICD-10-CM | POA: Insufficient documentation

## 2012-01-31 DIAGNOSIS — Z91018 Allergy to other foods: Secondary | ICD-10-CM | POA: Insufficient documentation

## 2012-01-31 DIAGNOSIS — E119 Type 2 diabetes mellitus without complications: Secondary | ICD-10-CM | POA: Insufficient documentation

## 2012-01-31 DIAGNOSIS — Z7901 Long term (current) use of anticoagulants: Secondary | ICD-10-CM | POA: Insufficient documentation

## 2012-01-31 DIAGNOSIS — Z885 Allergy status to narcotic agent status: Secondary | ICD-10-CM | POA: Insufficient documentation

## 2012-01-31 DIAGNOSIS — Z888 Allergy status to other drugs, medicaments and biological substances status: Secondary | ICD-10-CM | POA: Insufficient documentation

## 2012-01-31 DIAGNOSIS — Z880 Allergy status to penicillin: Secondary | ICD-10-CM | POA: Insufficient documentation

## 2012-01-31 DIAGNOSIS — Z882 Allergy status to sulfonamides status: Secondary | ICD-10-CM | POA: Insufficient documentation

## 2012-01-31 DIAGNOSIS — G4733 Obstructive sleep apnea (adult) (pediatric): Secondary | ICD-10-CM | POA: Insufficient documentation

## 2012-01-31 DIAGNOSIS — K219 Gastro-esophageal reflux disease without esophagitis: Secondary | ICD-10-CM | POA: Insufficient documentation

## 2012-01-31 DIAGNOSIS — Z79899 Other long term (current) drug therapy: Secondary | ICD-10-CM | POA: Insufficient documentation

## 2012-01-31 DIAGNOSIS — N318 Other neuromuscular dysfunction of bladder: Secondary | ICD-10-CM | POA: Insufficient documentation

## 2012-01-31 DIAGNOSIS — F3289 Other specified depressive episodes: Secondary | ICD-10-CM | POA: Insufficient documentation

## 2012-01-31 DIAGNOSIS — F329 Major depressive disorder, single episode, unspecified: Secondary | ICD-10-CM | POA: Insufficient documentation

## 2012-01-31 DIAGNOSIS — R05 Cough: Secondary | ICD-10-CM

## 2012-01-31 LAB — BASIC METABOLIC PANEL
BUN: 17 mg/dL (ref 6–23)
Calcium: 9.2 mg/dL (ref 8.4–10.5)
Creatinine, Ser: 0.77 mg/dL (ref 0.50–1.10)
GFR calc Af Amer: >90 mL/min (ref >90)
GFR calc non Af Amer: >90 mL/min (ref >90)
Glucose, Bld: 245 mg/dL — ABNORMAL HIGH (ref 70–99)

## 2012-01-31 LAB — CBC WITH DIFFERENTIAL/PLATELET
Basophils Relative: 0 % (ref 0–1)
Eosinophils Absolute: 0.1 10*3/uL (ref 0.0–0.7)
Eosinophils Relative: 1 % (ref 0–5)
Hemoglobin: 11.3 g/dL — ABNORMAL LOW (ref 12.0–15.0)
Lymphs Abs: 2.1 10*3/uL (ref 0.7–4.0)
MCH: 30.5 pg (ref 26.0–34.0)
MCHC: 32.7 g/dL (ref 30.0–36.0)
MCV: 93.5 fL (ref 78.0–100.0)
Monocytes Relative: 11 % (ref 3–12)
Neutrophils Relative %: 75 % (ref 43–77)
RBC: 3.7 MIL/uL — ABNORMAL LOW (ref 3.87–5.11)

## 2012-01-31 LAB — URINALYSIS, ROUTINE W REFLEX MICROSCOPIC
Bilirubin Urine: NEGATIVE
Hgb urine dipstick: NEGATIVE
Ketones, ur: NEGATIVE mg/dL
Nitrite: NEGATIVE
Protein, ur: NEGATIVE mg/dL
Urobilinogen, UA: 0.2 mg/dL (ref 0.0–1.0)

## 2012-01-31 LAB — GLUCOSE, CAPILLARY

## 2012-01-31 NOTE — ED Notes (Signed)
Pt. Here per [facility] with c/o weakness that has progressively gotten worse over 1 month.  Pt. also c/o productive cough x 5 days which her PCP has her taking an abx for. Pt. Has trach d/t sleep apnea/obeseity that was performed last week here at [facility].

## 2012-01-31 NOTE — ED Notes (Signed)
Patient suctioned via tracheostomy tube. Scant amount of  Pale yellow secretions obtained.

## 2012-01-31 NOTE — ED Provider Notes (Signed)
History     CSN: [id number]  Arrival date & time 01/31/12  6578   First MD Initiated Contact with Patient 01/31/12 1221      Chief Complaint  Patient presents with  . Weakness    progressive getting worse over last month  . Cough    productive cough x 5 days, taking abx for same    (Consider location/radiation/quality/duration/timing/severity/associated sxs/prior treatment) HPI Comments: Patient presents today with a chief complaint of productive cough and feeling of generalized weakness.  Weakness has been gradually worsening over the past month.  She reports that her productive cough has been present for the past five days.  She is describing mild SOB, but no chest pain.  She was recently seen her PCP.  She reports that she has been prescribed Avelox, but she has not started taking it yet.  She has had a recent abscess and a recent UTI.  She reports that dysuria is improving and her abscess has resolved.  She has another appointment scheduled with her PCP tomorrow.  She has had a trach in place for the past 13 years, however, she reports that last week she had her current trach replaced with a bigger trach.  No erythema or pain around the site of the [place].  She is also complaining of intermittent fevers over the past month.    Patient is a 51 y.o. female presenting with weakness and cough. The history is provided by the patient.  Weakness The primary symptoms include fever. Primary symptoms do not include nausea or vomiting.  Additional symptoms include weakness.  Cough Associated symptoms include chills. Pertinent negatives include no chest pain.    Past Medical History  Diagnosis Date  . OSA (obstructive sleep apnea)   . Morbid obesity   . Depressive disorder, not elsewhere classified   . Diabetes mellitus   . Asthma   . Dyslipidemia   . Clostridium difficile enterocolitis 2012  . Splenomegaly 06/01/2011  . Thrombocytopenia 06/01/2011  . Hepatosplenomegaly   . Gout   . GERD  (gastroesophageal reflux disease)   . Osteoarthritis   . Polyneuropathy   . Urinary incontinence   . Amenorrhea   . CHF (congestive heart failure)   . Hyperlipidemia   . Leg swelling   . Nausea   . Weakness   . Palpitations   . PONV (postoperative nausea and vomiting)   . Anginal pain     no chest pain in years  . Dysrhythmia     heart palpations  . Anxiety   . Pneumonia   . Shortness of breath   . Chronic kidney disease     overactive bladder  . Peripheral neuropathy     Past Surgical History  Procedure Date  . Cholecystectomy   . Tracheostomy   . Endometrial ablation   . Skin graft   . Finger surgery     ring finger on right hand  . Esophageal dilation 2003  . Breast biopsy     needle core, left  . Dilation and curettage of uterus     times 2  . Tracheal dilitation 01/24/2012    Procedure: TRACHEAL DILITATION;  Surgeon: [name], MD;  Location[facility];  Service: ENT;  Laterality: N/A;  Trache change with possible dilation, from size 6 to size 7 uncuffed    Family History  Problem Relation Age of Onset  . Diabetes Father   . Hypertension Father   . Dementia Father   . Pneumonia Father   .  Heart disease Mother   . Clotting disorder Mother   . Hypertension Mother   . Heart attack Mother   . Multiple myeloma Paternal Grandmother   . Cancer Paternal Grandmother     multiple myoloma  . Heart disease Paternal Grandfather   . Kidney disease Maternal Grandmother   . Hypertension Sister   . Cancer Paternal Aunt     breast  . Cancer Cousin     breast - all three    History  Substance Use Topics  . Smoking status: Never Smoker   . Smokeless tobacco: Never Used  . Alcohol Use: Yes     occasional - once or twice a year    OB History    Grav Para Term Preterm Abortions TAB SAB Ect Mult Living   0               Review of Systems  Constitutional: Positive for fever and chills. Negative for diaphoresis.  Respiratory: Positive for cough.   Cardiovascular:  Negative for chest pain and palpitations.       No new leg swelling  Gastrointestinal: Negative for nausea and vomiting.  Neurological: Positive for weakness. Negative for numbness.    Allergies  Molds & smuts; Nitroglycerin er; Allopurinol; Amoxicillin-pot clavulanate; Ciprofloxacin; Codeine; Fexofenadine; Morphine; Sulfa drugs cross reactors; and Sulfonamide derivatives  Home Medications   Current Outpatient Rx  Name Route Sig Dispense Refill  . ACETAMINOPHEN 500 MG PO TABS Oral Take 1,000 mg by mouth as needed. For pain    . ALBUTEROL SULFATE (2.5 MG/3ML) 0.083% IN NEBU Nebulization Take 2.5 mg by nebulization every 4 (four) hours as needed. For shortness of breath    . ALPRAZOLAM 0.5 MG PO TABS Oral Take 0.5 mg by mouth 2 (two) times daily as needed. For anxiety    . ASPIRIN EC 81 MG PO TBEC Oral Take 81 mg by mouth daily.    . ATORVASTATIN CALCIUM 10 MG PO TABS Oral Take 10 mg by mouth at bedtime.     [name] VITAMIN B COMPLEX PO Oral Take by mouth daily.    . BUDESONIDE 0.25 MG/2ML IN SUSP Nebulization Take 0.25 mg by nebulization 3 (three) times daily as needed. For shortness of breath.    . BUMETANIDE 1 MG PO TABS Oral Take 2 mg by mouth 2 (two) times daily.     . BUSPIRONE HCL 5 MG PO TABS Oral Take 7.5 mg by mouth 2 (two) times daily. \    . CHOLESTYRAMINE 4 G PO PACK Oral Take 1 packet by mouth as needed. For loose stools, IBS.    [name] COLCHICINE 0.6 MG PO TABS Oral Take 0.6 mg by mouth daily.    [name] DICYCLOMINE HCL 20 MG PO TABS Oral Take 20 mg by mouth 2 (two) times daily.      . DULOXETINE HCL 30 MG PO CPEP Oral Take 30 mg by mouth 2 (two) times daily.      . ERGOCALCIFEROL 50000 UNITS PO CAPS Oral Take 50,000 Units by mouth once a week. Take on Fridays    . FEBUXOSTAT 80 MG PO TABS Oral Take by mouth daily.    . FESOTERODINE FUMARATE ER 4 MG PO TB24 Oral Take 4 mg by mouth daily.      . OMEGA-3 FATTY ACIDS 1000 MG PO CAPS Oral Take 2 g by mouth daily.    [name] GABAPENTIN 100 MG PO CAPS  Oral Take 100 mg by mouth 4 (four) times daily.     [name]  GUAIFENESIN ER 600 MG PO TB12 Oral Take 1,200 mg by mouth 2 (two) times daily.      [name] HYDROCODONE-ACETAMINOPHEN 5-500 MG PO TABS Oral Take 1 tablet by mouth every 6 (six) hours as needed. For pain    . INSULIN ASPART 100 UNIT/ML [place] SOLN Subcutaneous Inject 16 Units into the skin 3 (three) times daily before meals.     . INSULIN GLARGINE 100 UNIT/ML [place] SOLN Subcutaneous Inject 70 Units into the skin 2 (two) times daily.     [name] LIRAGLUTIDE 18 MG/3ML  SOLN Subcutaneous Inject 1.8 mLs into the skin every morning.    [name] LORATADINE 10 MG PO TABS Oral Take 10 mg by mouth daily as needed.     [name] MECLIZINE HCL 25 MG PO TABS Oral Take 25 mg by mouth daily.    [name] METOLAZONE 5 MG PO TABS Oral Take 5 mg by mouth every other day.    . MULTIVITAMINS PO CAPS Oral Take 1 capsule by mouth daily.      . NEBIVOLOL HCL 10 MG PO TABS Oral Take 10 mg by mouth daily.      [name] OMEPRAZOLE 40 MG PO CPDR Oral Take 40 mg by mouth daily.      [name] POLYETHYLENE GLYCOL 3350 PO PACK Oral Take 17 g by mouth daily as needed. For constipation.    [name] POTASSIUM CHLORIDE CRYS ER 20 MEQ PO TBCR Oral Take 20 mEq by mouth 2 (two) times daily.    [name] PRIMIDONE 50 MG PO TABS Oral Take 25-75 mg by mouth 2 (two) times daily. Take 25 MG in the morning and 75 MG at bedtime.    [name] PROBIOTIC PO CAPS Oral Take 1 capsule by mouth daily.     [name] SIMETHICONE 180 MG PO CAPS Oral Take 1 capsule by mouth daily as needed. For gas    . TOPIRAMATE 25 MG PO TABS Oral Take 1 tablet (25 mg total) by mouth daily.    [name] VALSARTAN 160 MG PO TABS Oral Take 160 mg by mouth daily.      . WARFARIN SODIUM 7.5 MG PO TABS Oral Take 5-7.5 mg by mouth daily. As directed. Take the 5mg  mon, tues, thurs, and fridays, and 7.5mg  rest of days    . MOXIFLOXACIN HCL 400 MG PO TABS Oral Take 400 mg by mouth daily. Take for 10 days.  First dose due 01/31/2012.      BP 118/47  Pulse 87  Resp 21  SpO2 95%  Physical Exam  Nursing note  and vitals reviewed. Constitutional: She appears well-developed and well-nourished. No distress.       Morbidly obese  HENT:  Head: Normocephalic and atraumatic.  Mouth/Throat: Oropharynx is clear and moist.  Neck:         No erythema or tenderness around the site of the trach.  Cardiovascular: Normal rate, regular rhythm and normal heart sounds.   No murmur heard. Pulmonary/Chest: Effort normal and breath sounds normal. No respiratory distress. She has no wheezes. She has no rales.  Neurological: She is alert.  Skin: Skin is warm and dry. She is not diaphoretic. No erythema.  Psychiatric: She has a normal mood and affect.    ED Course  Procedures (including critical care time)   Labs Reviewed  CBC WITH DIFFERENTIAL  BASIC METABOLIC PANEL  URINALYSIS, ROUTINE W REFLEX MICROSCOPIC   Dg Chest 2 View  01/31/2012  *RADIOLOGY REPORT*  Clinical Data: Cough, fever, shortness of breath, chest pain  CHEST - 2 VIEW  Comparison: 01/21/2012  Findings: Fine detail is obscured by patient body habitus. Tracheostomy tube appears appropriately positioned.  Lung bases are obscured by body habitus.  Lungs are grossly clear otherwise. Borderline cardiomegaly again noted without gross evidence for edema. Lateral view is degraded by respiratory motion.  IMPRESSION: Mild cardiomegaly without focal acute finding.  Original Report Authenticated By: Harrel Lemon, [name]     No diagnosis found.    MDM  Patient presenting with a chief complaint of productive cough and subjective fever.  No acute findings on CXR.  Pulse ox normal.  Patient afebrile in the ED.  No acute distress.  Patient has appointment scheduled with PCP tomorrow.  Therefore, feel that patient can be discharged home and follow up with PCP tomorrow.        Pascal Lux Vaughn, PA-C 01/31/12 1826

## 2012-01-31 NOTE — ED Notes (Signed)
To ED with C/O chills fever and dyspnea.  C/O increased mucus production from her trach and loss of appetite.  States that she cannot ambulate well. Trach suctioned for a small amount of light yellow secretions.

## 2012-01-31 NOTE — ED Notes (Signed)
Tracheostomy suctioned for a small amount of pale yellow secretions.

## 2012-01-31 NOTE — ED Notes (Signed)
Patient reports recent history of UTI.

## 2012-01-31 NOTE — ED Notes (Signed)
[facility] called for transportation. Respirations even & unlabored, no distress.

## 2012-02-01 ENCOUNTER — Telehealth: Payer: Self-pay | Admitting: Pulmonary Disease

## 2012-02-01 NOTE — Telephone Encounter (Signed)
LMTCB for pt to see if willing to see another provider.

## 2012-02-01 NOTE — Telephone Encounter (Signed)
mindy please advise of an appt for this pt next week with [facility].  Looks like he is booked.  Dr. [name] wanted her to be seen next week.  thanks

## 2012-02-01 NOTE — ED Provider Notes (Signed)
Medical screening examination/treatment/procedure(s) were performed by non-physician practitioner and as supervising physician I was immediately available for consultation/collaboration.  Orrie Lascano L Tigerlily Christine, MD 02/01/12 0736 

## 2012-02-01 NOTE — Telephone Encounter (Signed)
Can she be schedule as an acute visit with another provider sooner?

## 2012-02-01 NOTE — Telephone Encounter (Signed)
Looked at [facility] schedule and he is completely booked up. Will see if he is okay with pt coming in and seeing another provider or if he wants to overbook. Looks like pt went to ED for productive cough getting worse. Pt has OV w/ Dr. Craige Cotta 02/14/12. Please advise Dr. Craige Cotta thanks

## 2012-02-04 ENCOUNTER — Encounter: Payer: Self-pay | Admitting: Pulmonary Disease

## 2012-02-04 ENCOUNTER — Ambulatory Visit (INDEPENDENT_AMBULATORY_CARE_PROVIDER_SITE_OTHER): Payer: Medicare Other | Admitting: Pulmonary Disease

## 2012-02-04 VITALS — BP 122/68 | HR 87 | Temp 98.2°F | Ht 68.0 in | Wt >= 6400 oz

## 2012-02-04 DIAGNOSIS — Z930 Tracheostomy status: Secondary | ICD-10-CM

## 2012-02-04 DIAGNOSIS — J45909 Unspecified asthma, uncomplicated: Secondary | ICD-10-CM

## 2012-02-04 DIAGNOSIS — E678 Other specified hyperalimentation: Secondary | ICD-10-CM

## 2012-02-04 DIAGNOSIS — J309 Allergic rhinitis, unspecified: Secondary | ICD-10-CM

## 2012-02-04 DIAGNOSIS — G4733 Obstructive sleep apnea (adult) (pediatric): Secondary | ICD-10-CM

## 2012-02-04 MED ORDER — LEVALBUTEROL HCL 0.63 MG/3ML IN NEBU
0.6300 mg | INHALATION_SOLUTION | Freq: Once | RESPIRATORY_TRACT | Status: AC
  Administered 2012-02-04: 0.63 mg via RESPIRATORY_TRACT

## 2012-02-04 MED ORDER — PREDNISONE 5 MG PO TABS: ORAL_TABLET | ORAL | Status: DC

## 2012-02-04 NOTE — Assessment & Plan Note (Signed)
Followed by Dr. Jearld Fenton.

## 2012-02-04 NOTE — Assessment & Plan Note (Signed)
Likely has acute asthma exacerbation related to recent episode of bronchitis.  She is to finish course of avelox.  Will extend her course of prednisone.  Advised she can use albuterol on a more regular basis until breathing improved.  Advised she should use oxygen more during day until her symptoms improved.  Will f/u in one week.

## 2012-02-04 NOTE — Progress Notes (Signed)
Chief Complaint  Patient presents with  . Follow-up    pt has been to the ED twice. Pt c/o cough w/ yellow phlem, wheezing, chest tx, increase SOB--feels like she can't breathe. She uses vent at night w/ 4 liters oxygen. She is taking avelox and prednisone   CC: [name]  History of Present Illness: [name] is a 51 y.o. female with OSA/OHS s/p trach on nocturnal vent, Asthma  She had I&D for vaginal abscess.  She was in hospital for 2 days.  She then was told she had a UTI and had her Abx changed.    She had her tracheostomy enlarged 8/01.  She did not have trouble initially, but then developed fever/chills/cough.  She had her Abx changed again.  This did not help, and she was changed to avelox>>she has 5 more days of this.  She was started on prednisone 3 days ago by her PCP>>she is not sure if this has helped.  She has been using albuterol, but not sure if this has helped.  She has cough with yellow sputum.  She has chest tightness and wheeze.  She is not having fever any more.  She has not noticed more swelling in her legs.  She denies pain or discharge from trach site.   Past Medical History  Diagnosis Date  . OSA (obstructive sleep apnea)   . Morbid obesity   . Depressive disorder, not elsewhere classified   . Diabetes mellitus   . Asthma   . Dyslipidemia   . Clostridium difficile enterocolitis 2012  . Splenomegaly 06/01/2011  . Thrombocytopenia 06/01/2011  . Hepatosplenomegaly   . Gout   . GERD (gastroesophageal reflux disease)   . Osteoarthritis   . Polyneuropathy   . Urinary incontinence   . Amenorrhea   . CHF (congestive heart failure)   . Hyperlipidemia   . Leg swelling   . Nausea   . Weakness   . Palpitations   . PONV (postoperative nausea and vomiting)   . Anginal pain     no chest pain in years  . Dysrhythmia     heart palpations  . Anxiety   . Pneumonia   . Shortness of breath   . Chronic kidney disease     overactive bladder  . Peripheral  neuropathy     Past Surgical History  Procedure Date  . Cholecystectomy   . Tracheostomy   . Endometrial ablation   . Skin graft   . Finger surgery     ring finger on right hand  . Esophageal dilation 2003  . Breast biopsy     needle core, left  . Dilation and curettage of uterus     times 2  . Tracheal dilitation 01/24/2012    Procedure: TRACHEAL DILITATION;  Surgeon: Bianca Obey, MD;  Location: [facility] OR;  Service: ENT;  Laterality: N/A;  Trache change with possible dilation, from size 6 to size 7 uncuffed    Outpatient Encounter Prescriptions as of 02/04/2012  Medication Sig Dispense Refill  . acetaminophen (TYLENOL) 500 MG tablet Take 1,000 mg by mouth as needed. For pain      . albuterol (PROVENTIL) (2.5 MG/3ML) 0.083% nebulizer solution Take 2.5 mg by nebulization every 4 (four) hours as needed. For shortness of breath      . ALPRAZolam (XANAX) 0.5 MG tablet Take 0.5 mg by mouth 2 (two) times daily as needed. For anxiety      . aspirin EC 81 MG tablet Take 81  mg by mouth daily.      [name] atorvastatin (LIPITOR) 10 MG tablet Take 10 mg by mouth at bedtime.       . B Complex Vitamins (VITAMIN B COMPLEX PO) Take by mouth daily.      . budesonide (PULMICORT) 0.25 MG/2ML nebulizer solution Take 0.25 mg by nebulization 3 (three) times daily as needed. For shortness of breath.      . bumetanide (BUMEX) 1 MG tablet Take 2 mg by mouth 2 (two) times daily.       . busPIRone (BUSPAR) 5 MG tablet Take 7.5 mg by mouth 2 (two) times daily. \      . cholestyramine (QUESTRAN) 4 G packet Take 1 packet by mouth as needed. For loose stools, IBS.      [name] colchicine 0.6 MG tablet Take 0.6 mg by mouth daily.      [name] dicyclomine (BENTYL) 20 MG tablet Take 20 mg by mouth 2 (two) times daily.        . DULoxetine (CYMBALTA) 30 MG capsule Take 30 mg by mouth 2 (two) times daily.        . ergocalciferol (VITAMIN D2) 50000 UNITS capsule Take 50,000 Units by mouth once a week. Take on Fridays      . Febuxostat  (ULORIC) 80 MG TABS Take by mouth daily.      . fesoterodine (TOVIAZ) 4 MG TB24 Take 4 mg by mouth daily.        . fish oil-omega-3 fatty acids 1000 MG capsule Take 2 capsules by mouth daily.       [name] gabapentin (NEURONTIN) 100 MG capsule Take 100 mg by mouth 4 (four) times daily.       [name] guaiFENesin (MUCINEX) 600 MG 12 hr tablet Take 1,200 mg by mouth 2 (two) times daily.        [name] HYDROcodone-acetaminophen (VICODIN) 5-500 MG per tablet Take 1 tablet by mouth every 6 (six) hours as needed. For pain      . insulin aspart (NOVOLOG) 100 UNIT/ML injection Inject 16 Units into the skin 3 (three) times daily before meals.       . insulin glargine (LANTUS) 100 UNIT/ML injection Inject 70 Units into the skin 2 (two) times daily.       . Liraglutide (VICTOZA) 18 MG/3ML SOLN Inject 1.8 mLs into the skin every morning.      . loratadine (CLARITIN) 10 MG tablet Take 10 mg by mouth daily as needed.       . meclizine (ANTIVERT) 25 MG tablet Take 25 mg by mouth daily.      . metolazone (ZAROXOLYN) 5 MG tablet Take 5 mg by mouth every other day.      . moxifloxacin (AVELOX) 400 MG tablet Take 400 mg by mouth daily. Take for 10 days.  First dose due 01/31/2012.      . Multiple Vitamin (MULTIVITAMIN) capsule Take 1 capsule by mouth daily.        . nebivolol (BYSTOLIC) 10 MG tablet Take 10 mg by mouth daily.        [name] omeprazole (PRILOSEC) 40 MG capsule Take 40 mg by mouth daily.        . polyethylene glycol (MIRALAX / GLYCOLAX) packet Take 17 g by mouth daily as needed. For constipation.      . potassium chloride SA (K-DUR,KLOR-CON) 20 MEQ tablet Take 20 mEq by mouth 3 (three) times daily.       . predniSONE (DELTASONE) 10 MG tablet  Taper as directed      . primidone (MYSOLINE) 50 MG tablet Take 25-75 mg by mouth 2 (two) times daily. Take 25 MG in the morning and 75 MG at bedtime.      [name] PROBIOTIC CAPS Take 1 capsule by mouth daily.       . Simethicone 180 MG CAPS Take 1 capsule by mouth daily as needed. For gas        . topiramate (TOPAMAX) 25 MG tablet Take 1 tablet (25 mg total) by mouth daily.      . valsartan (DIOVAN) 160 MG tablet Take 160 mg by mouth daily.        [name] warfarin (COUMADIN) 7.5 MG tablet Take 5-7.5 mg by mouth daily. As directed. Take the 5mg  mon, tues, thurs, and fridays, and 7.5mg  rest of days      . predniSONE (DELTASONE) 5 MG tablet 2 pills daily for 2 days, 1 pill daily for 4 days  8 tablet  0  . DISCONTD: predniSONE (DELTASONE) 5 MG tablet 2 pills daily for 2 days, 1 pill daily for 4 days  8 tablet  0   Facility-Administered Encounter Medications as of 02/04/2012  Medication Dose Route Frequency Provider Last Rate Last Dose  . levalbuterol (XOPENEX) nebulizer solution 0.63 mg  0.63 mg Nebulization Once Coralyn Helling, MD   0.63 mg at 02/04/12 1559    Allergies  Allergen Reactions  . Molds & Smuts Shortness Of Breath and Rash  . Nitroglycerin Er Nausea And Vomiting  . Allopurinol     GI  . Amoxicillin-Pot Clavulanate     unknown  . Ciprofloxacin     GI problems  . Codeine     Heart problems  . Fexofenadine     Hurts joints  . Morphine     unknown  . Sulfa Drugs Cross Reactors Nausea And Vomiting    nausea  . Sulfonamide Derivatives     nausea    Physical Exam:  Filed Vitals:   02/04/12 1517 02/04/12 1518 02/04/12 1519  BP:  122/68   Pulse:  100 87  Temp: 98.2 F (36.8 C)    TempSrc: Oral    Height: 5\' 8"  (1.727 m)    Weight: 411 lb 3.2 oz (186.519 kg)    SpO2:  85% 97%    Body mass index is 62.52 kg/(m^2). Wt Readings from Last 2 Encounters:  02/04/12 411 lb 3.2 oz (186.519 kg)  01/21/12 417 lb (189.15 kg)    General - Obese ENT - no sinus tenderness, clear nasal discharge, no oral exudate, trach site clean Cardiac - s1s2 regular, no murmur, pulses symmetric, no edema Chest - prolonged exhalation, b/l expiratory wheeze>>improved after neb tx Back - no focal tenderness Abd - soft, non-tender, + bowel sounds Ext - normal motor strength Neuro - Cranial  nerves are normal. PERLA. EOM's intact. Skin - chronic venous stasis changes Psych - normal mood, and behavior.  Dg Chest 2 View  01/31/2012  *RADIOLOGY REPORT*  Clinical Data: Cough, fever, shortness of breath, chest pain  CHEST - 2 VIEW  Comparison: 01/21/2012  Findings: Fine detail is obscured by patient body habitus. Tracheostomy tube appears appropriately positioned.  Lung bases are obscured by body habitus.  Lungs are grossly clear otherwise. Borderline cardiomegaly again noted without gross evidence for edema. Lateral view is degraded by respiratory motion.  IMPRESSION: Mild cardiomegaly without focal acute finding.  Original Report Authenticated By: Harrel Lemon, [name]   Lab Results  Component  Value Date   WBC 14.9* 01/31/2012   HGB 11.3* 01/31/2012   HCT 34.6* 01/31/2012   MCV 93.5 01/31/2012   PLT 107* 01/31/2012   Lab Results  Component Value Date   CREATININE 0.77 01/31/2012   BUN 17 01/31/2012   NA 135 01/31/2012   K 3.3* 01/31/2012   CL 92* 01/31/2012   CO2 31 01/31/2012   Assessment/Plan:  Coralyn Helling, MD [facility]/Critical [facility]/Sleep Pager:  [phone] 02/04/2012, 4:45 PM

## 2012-02-04 NOTE — Telephone Encounter (Signed)
Dianna from Dr Jone Baseman called to follow up.  Advised we ATC pt on Friday and LM to see if she would be ok to see another provider.  [facility] actually has an opening today at 2:45pm > ov scheduled.  Pt to be contacted by Dianna per her report.  Nothing further needed; will sign off.

## 2012-02-04 NOTE — Assessment & Plan Note (Signed)
Continue nocturnal vent.     

## 2012-02-04 NOTE — Patient Instructions (Signed)
Prednisone 5 mg pills>>2 pills daily for 2 days, then 1 pill daily for 4 days Finish course of avelox Follow up in one week

## 2012-02-04 NOTE — Assessment & Plan Note (Signed)
She is to continue tracheostomy with nocturnal vent.     

## 2012-02-14 ENCOUNTER — Telehealth: Payer: Self-pay | Admitting: Pulmonary Disease

## 2012-02-14 ENCOUNTER — Ambulatory Visit (INDEPENDENT_AMBULATORY_CARE_PROVIDER_SITE_OTHER): Payer: Medicare Other | Admitting: Pulmonary Disease

## 2012-02-14 ENCOUNTER — Encounter: Payer: Self-pay | Admitting: Pulmonary Disease

## 2012-02-14 VITALS — BP 112/64 | HR 86 | Temp 98.3°F | Ht 68.0 in | Wt >= 6400 oz

## 2012-02-14 DIAGNOSIS — Z930 Tracheostomy status: Secondary | ICD-10-CM

## 2012-02-14 DIAGNOSIS — J45909 Unspecified asthma, uncomplicated: Secondary | ICD-10-CM

## 2012-02-14 DIAGNOSIS — E678 Other specified hyperalimentation: Secondary | ICD-10-CM

## 2012-02-14 DIAGNOSIS — G4733 Obstructive sleep apnea (adult) (pediatric): Secondary | ICD-10-CM

## 2012-02-14 MED ORDER — MONTELUKAST SODIUM 10 MG PO TABS: 10.0000 mg | ORAL_TABLET | Freq: Every day | ORAL | Status: DC

## 2012-02-14 NOTE — Assessment & Plan Note (Signed)
Improved after recent exacerbation.  She does not need additional antibiotics or prednisone at this time.    Will add singulair to her regimen for two weeks, and then as needed after that.  She is to continue pulmicort and prn albuterol.

## 2012-02-14 NOTE — Patient Instructions (Signed)
Montelukast (singulair) 10 mg one pill nightly Follow up in 8 weeks

## 2012-02-14 NOTE — Assessment & Plan Note (Signed)
She is to continue tracheostomy with nocturnal vent.     

## 2012-02-14 NOTE — Telephone Encounter (Signed)
Okay to change to accolate.  Please send script for accolate 20 mg one pill twice per day.  Dispense 60 with 5 refills.

## 2012-02-14 NOTE — Assessment & Plan Note (Signed)
Continue nocturnal vent.     

## 2012-02-14 NOTE — Assessment & Plan Note (Signed)
Followed by Dr. Byers. 

## 2012-02-14 NOTE — Progress Notes (Signed)
Chief Complaint  Patient presents with  . Follow-up    Patient states a little better since last visit. c/o sob with exertion and at rest, chest pain, chest tightness with exertion, and cough.    CC: [name]  History of Present Illness: [name] is a 51 y.o. female with OSA/OHS s/p trach on nocturnal vent, Asthma  She is feeling better.  She still gets winded with exertion, but this is improved.  She gets cough in morning with yellow to white sputum.  This is coming up easier.  She is not wheezing as much.  She uses albuterol 2 to 3 times per day, and pulmicort twice per day.  She denies fever or sinus congestion.  She has finished abx and prednisone.   Past Medical History  Diagnosis Date  . OSA (obstructive sleep apnea)   . Morbid obesity   . Depressive disorder, not elsewhere classified   . Diabetes mellitus   . Asthma   . Dyslipidemia   . Clostridium difficile enterocolitis 2012  . Splenomegaly 06/01/2011  . Thrombocytopenia 06/01/2011  . Hepatosplenomegaly   . Gout   . GERD (gastroesophageal reflux disease)   . Osteoarthritis   . Polyneuropathy   . Urinary incontinence   . Amenorrhea   . CHF (congestive heart failure)   . Hyperlipidemia   . Leg swelling   . Nausea   . Weakness   . Palpitations   . PONV (postoperative nausea and vomiting)   . Anginal pain     no chest pain in years  . Dysrhythmia     heart palpations  . Anxiety   . Pneumonia   . Shortness of breath   . Chronic kidney disease     overactive bladder  . Peripheral neuropathy     Past Surgical History  Procedure Date  . Cholecystectomy   . Tracheostomy   . Endometrial ablation   . Skin graft   . Finger surgery     ring finger on right hand  . Esophageal dilation 2003  . Breast biopsy     needle core, left  . Dilation and curettage of uterus     times 2  . Tracheal dilitation 01/24/2012    Procedure: TRACHEAL DILITATION;  Surgeon: Bianca Obey, MD;  Location: [facility] OR;  Service: ENT;   Laterality: N/A;  Trache change with possible dilation, from size 6 to size 7 uncuffed    Outpatient Encounter Prescriptions as of 02/14/2012  Medication Sig Dispense Refill  . acetaminophen (TYLENOL) 500 MG tablet Take 1,000 mg by mouth as needed. For pain      . albuterol (PROVENTIL) (2.5 MG/3ML) 0.083% nebulizer solution Take 2.5 mg by nebulization every 4 (four) hours as needed. For shortness of breath      . ALPRAZolam (XANAX) 0.5 MG tablet Take 0.5 mg by mouth 2 (two) times daily as needed. For anxiety      . aspirin EC 81 MG tablet Take 81 mg by mouth daily.      [name] atorvastatin (LIPITOR) 10 MG tablet Take 10 mg by mouth at bedtime.       . B Complex Vitamins (VITAMIN B COMPLEX PO) Take by mouth daily.      . budesonide (PULMICORT) 0.25 MG/2ML nebulizer solution Take 0.25 mg by nebulization 3 (three) times daily as needed. For shortness of breath.      . bumetanide (BUMEX) 1 MG tablet Take 2 mg by mouth 2 (two) times daily.       [name]  busPIRone (BUSPAR) 5 MG tablet Take 7.5 mg by mouth 2 (two) times daily. \      . cholestyramine (QUESTRAN) 4 G packet Take 1 packet by mouth as needed. For loose stools, IBS.      [name] colchicine 0.6 MG tablet Take 0.6 mg by mouth daily.      [name] dicyclomine (BENTYL) 20 MG tablet Take 20 mg by mouth 2 (two) times daily.        . DULoxetine (CYMBALTA) 30 MG capsule Take 30 mg by mouth 2 (two) times daily.        . ergocalciferol (VITAMIN D2) 50000 UNITS capsule Take 50,000 Units by mouth once a week. Take on Fridays      . Febuxostat (ULORIC) 80 MG TABS Take by mouth daily.      . fesoterodine (TOVIAZ) 4 MG TB24 Take 4 mg by mouth daily.        . fish oil-omega-3 fatty acids 1000 MG capsule Take 2 capsules by mouth daily.       [name] gabapentin (NEURONTIN) 100 MG capsule Take 100 mg by mouth 4 (four) times daily.       [name] guaiFENesin (MUCINEX) 600 MG 12 hr tablet Take 1,200 mg by mouth 2 (two) times daily.        [name] HYDROcodone-acetaminophen (VICODIN) 5-500 MG per tablet  Take 1 tablet by mouth every 6 (six) hours as needed. For pain      . insulin aspart (NOVOLOG) 100 UNIT/ML injection Inject 16 Units into the skin 3 (three) times daily before meals.       . insulin glargine (LANTUS) 100 UNIT/ML injection Inject 70 Units into the skin 2 (two) times daily.       . Liraglutide (VICTOZA) 18 MG/3ML SOLN Inject 1.8 mLs into the skin every morning.      . loratadine (CLARITIN) 10 MG tablet Take 10 mg by mouth daily as needed.       . meclizine (ANTIVERT) 25 MG tablet Take 25 mg by mouth daily.      . metolazone (ZAROXOLYN) 5 MG tablet Take 5 mg by mouth every other day.      . Multiple Vitamin (MULTIVITAMIN) capsule Take 1 capsule by mouth daily.        . nebivolol (BYSTOLIC) 10 MG tablet Take 10 mg by mouth daily.        [name] omeprazole (PRILOSEC) 40 MG capsule Take 40 mg by mouth daily.        . polyethylene glycol (MIRALAX / GLYCOLAX) packet Take 17 g by mouth daily as needed. For constipation.      . potassium chloride SA (K-DUR,KLOR-CON) 20 MEQ tablet Take 20 mEq by mouth 3 (three) times daily.       . primidone (MYSOLINE) 50 MG tablet Take 25-75 mg by mouth 2 (two) times daily. Take 25 MG in the morning and 75 MG at bedtime.      [name] PROBIOTIC CAPS Take 1 capsule by mouth daily.       . Simethicone 180 MG CAPS Take 1 capsule by mouth daily as needed. For gas      . topiramate (TOPAMAX) 25 MG tablet Take 1 tablet (25 mg total) by mouth daily.      . valsartan (DIOVAN) 160 MG tablet Take 160 mg by mouth daily.        [name] warfarin (COUMADIN) 7.5 MG tablet Take 5-7.5 mg by mouth daily. As directed. Take the 5mg  mon, tues, thurs, and  fridays, and 7.5mg  rest of days      . DISCONTD: predniSONE (DELTASONE) 10 MG tablet Taper as directed      . moxifloxacin (AVELOX) 400 MG tablet Take 400 mg by mouth daily. Take for 10 days.  First dose due 01/31/2012.      . predniSONE (DELTASONE) 5 MG tablet 2 pills daily for 2 days, 1 pill daily for 4 days  8 tablet  0    Allergies    Allergen Reactions  . Molds & Smuts Shortness Of Breath and Rash  . Nitroglycerin Er Nausea And Vomiting  . Allopurinol     GI  . Amoxicillin-Pot Clavulanate     unknown  . Ciprofloxacin     GI problems  . Codeine     Heart problems  . Fexofenadine     Hurts joints  . Morphine     unknown  . Sulfa Drugs Cross Reactors Nausea And Vomiting    nausea  . Sulfonamide Derivatives     nausea    Physical Exam:  Filed Vitals:   02/14/12 1044  BP: 112/64  Pulse: 86  Temp: 98.3 F (36.8 C)  TempSrc: Oral  Height: 5\' 8"  (1.727 m)  Weight: 410 lb 12.8 oz (186.338 kg)  SpO2: 97%    Body mass index is 62.46 kg/(m^2). Wt Readings from Last 2 Encounters:  02/14/12 410 lb 12.8 oz (186.338 kg)  02/04/12 411 lb 3.2 oz (186.519 kg)    General - Obese ENT - no sinus tenderness, clear nasal discharge, no oral exudate, trach site clean Cardiac - s1s2 regular, no murmur, pulses symmetric, no edema Chest - prolonged exhalation, no wheeze Back - no focal tenderness Abd - soft, non-tender, + bowel sounds Ext - normal motor strength Neuro - Cranial nerves are normal. PERLA. EOM's intact. Skin - chronic venous stasis changes Psych - normal mood, and behavior.   Assessment/Plan:  Coralyn Helling, MD  Pulmonary/Critical Care/Sleep Pager:  [phone] 02/14/2012, 10:53 AM

## 2012-02-14 NOTE — Telephone Encounter (Signed)
I spoke with pt and she stated her insurance requires a PA for the Singulair. Her formulary states it will cover accolate.  Please advised Dr. Craige Cotta if you want to change pt's Singulair or initiate a PA. thanks

## 2012-02-15 MED ORDER — ZAFIRLUKAST 20 MG PO TABS: 20.0000 mg | ORAL_TABLET | Freq: Two times a day (BID) | ORAL | Status: DC

## 2012-02-15 NOTE — Telephone Encounter (Signed)
Called and spoke with pt and she is aware of rx sent to the pharmacy for the accolate 20 mg  1 po bid with 5 refills.  Pt is aware and nothing further is needed.

## 2012-02-22 ENCOUNTER — Telehealth: Payer: Self-pay | Admitting: Pulmonary Disease

## 2012-02-22 NOTE — Telephone Encounter (Signed)
Called and spoke with patient regarding her reaction.  Patient states Accolate Rx called in Monday for her, she started the medication on Tuesday and also started itching after taking the medication. Patient reports itching is in vaginal and groin areas. Patient states she has taken the medication everyday as directed and itch has continued. Pt reports she has only tried antifungal cream for relief. Dr. Craige Cotta please advise, thank you!

## 2012-02-22 NOTE — Telephone Encounter (Signed)
Called and spoke with patient informed her of Dr. Silverio Lay recs.  Patient verbalized understanding and nothing further needed at this time.

## 2012-02-22 NOTE — Telephone Encounter (Signed)
Please advise her to stop medicine, and monitor her itching.  If this persists she would need to speak with her PCP about possible candidal vaginitis.  She is to call back if her breathing gets worse after stopping accolate.

## 2012-03-04 ENCOUNTER — Encounter: Payer: Medicare Other | Attending: Family Medicine | Admitting: Dietician

## 2012-03-04 ENCOUNTER — Encounter: Payer: Self-pay | Admitting: Dietician

## 2012-03-04 DIAGNOSIS — Z713 Dietary counseling and surveillance: Secondary | ICD-10-CM | POA: Insufficient documentation

## 2012-03-04 DIAGNOSIS — E669 Obesity, unspecified: Secondary | ICD-10-CM

## 2012-03-04 DIAGNOSIS — E119 Type 2 diabetes mellitus without complications: Secondary | ICD-10-CM | POA: Insufficient documentation

## 2012-03-04 NOTE — Progress Notes (Signed)
  Medical Nutrition Therapy:  Appt start time: 1030 end time:  1100.  Assessment:  Primary concerns today: Breathing is much worse and has been for a number of weeks.  Has had a flair up of her asthma and has needed to go on Prednisone.  She is fearful that this has caused a drastic increase in her weight.  She notes that with this recent illness, she has experienced a sever loss of appetite and is at times making herself eat.  She has decreased her portions and "amm really not hungry."  She notes that when she has a surge of energy, she Domenick Quebedeaux go to the kitchen to do some cooking.  She Kaedin Hicklin prepare one dish to eat on for a few days.  BLOOD GLUCOSE:   Fasting:229,287,296,300,192,226 (as examples). AC Dinner 221, [id number], 180, [id number] (as examples).  HYPOGLYCEMIA: Gives no history of the S/S of low blood glucose. HYPERGLYCEMIA:  Gives no history of the overt S/S of elevated blood glucose other than being tired.   MEDICATIONS: Review of medications completes.  DIETARY INTAKE:  24-hr recall:  B (9:00 AM): 1.5 strips of bacon, 2 eggs, 2 slices of toast  Snk (mid AM) :none  L (12:00-1:00 PM): pack of Lance PB crackers, 1/2 sandwich, cheese stick, a yogurt  Snk (mid PM): none D (6:00 PM):  Small serving of roast, some potatoes and carrots, and a banana  Snk ( PM): none Beverages: diet beverages and water  Recent physical activity: Extremely limited.  Becomes short of breath just walking into the office using her walker  Estimated energy [phone] calories per day with regular meals and snacks.  Having lean or low fat protein source at all meals and snacks.  Progress Towards Goal(s):  No progress.   Nutritional Diagnosis:  [place]-3.3 Overweight/obesity As related to history of limited exercise, inappropriate serving sizes over the years, preferencce for sweets and desserts in the past.  As evidenced by Weight of 417.6 lb with BMI at 63.6 kg/m2, impaired mobility and impaired breathing.      Intervention:  Nutrition Review of the need to not skip meals and to have a snack if not hungry for a meal.  Stressed the need to increase her protein intake given all the recent infections and the use of prednisone for her lungs..  Handouts given during visit include:  Blood glucose log  Monitoring/Evaluation:  Dietary intake, exercise, blood glucose levels, and body weight in three months.

## 2012-03-04 NOTE — Patient Instructions (Addendum)
   Try the [employer], the low carb version for the mid-day snack/min- imeal or a night time snack.  Continue to do the pedicure.  Keep up the fluids for bladder and secretions in your lungs.  Keep trying to stay in on those days that the humidity is so high.  Weight today is 417.6 lb.

## 2012-03-22 ENCOUNTER — Telehealth: Payer: Self-pay | Admitting: Internal Medicine

## 2012-03-22 MED ORDER — AZITHROMYCIN 250 MG PO TABS: ORAL_TABLET | ORAL | Status: DC

## 2012-03-22 MED ORDER — PREDNISONE 10 MG PO TABS: ORAL_TABLET | ORAL | Status: DC

## 2012-03-22 NOTE — Telephone Encounter (Signed)
Patient followed by [facility] with trach for OSA, asthma with bronchitis. Had flu vax last week. Now 2-3 days of cough w/ clear-> yellowing mucus, low grade fever, tussive sore throat. Saw PCP and told URI, no Rx. Now worse. Asks ABX and pred. Plan- Zpak, pred taper.Keep pending appointment.

## 2012-03-24 ENCOUNTER — Telehealth: Payer: Self-pay | Admitting: Pulmonary Disease

## 2012-03-24 ENCOUNTER — Emergency Department (HOSPITAL_COMMUNITY)
Admission: EM | Admit: 2012-03-24 | Discharge: 2012-03-24 | Disposition: A | Discharge status: Home / Self Care | Payer: Medicare Other | Attending: Emergency Medicine | Admitting: Emergency Medicine

## 2012-03-24 ENCOUNTER — Emergency Department (HOSPITAL_COMMUNITY): Payer: Medicare Other

## 2012-03-24 ENCOUNTER — Encounter (HOSPITAL_COMMUNITY): Payer: Self-pay | Admitting: Emergency Medicine

## 2012-03-24 DIAGNOSIS — I509 Heart failure, unspecified: Secondary | ICD-10-CM | POA: Insufficient documentation

## 2012-03-24 DIAGNOSIS — G4733 Obstructive sleep apnea (adult) (pediatric): Secondary | ICD-10-CM | POA: Insufficient documentation

## 2012-03-24 DIAGNOSIS — R110 Nausea: Secondary | ICD-10-CM | POA: Insufficient documentation

## 2012-03-24 DIAGNOSIS — J45909 Unspecified asthma, uncomplicated: Secondary | ICD-10-CM | POA: Insufficient documentation

## 2012-03-24 DIAGNOSIS — J069 Acute upper respiratory infection, unspecified: Secondary | ICD-10-CM

## 2012-03-24 DIAGNOSIS — M1990 Unspecified osteoarthritis, unspecified site: Secondary | ICD-10-CM | POA: Insufficient documentation

## 2012-03-24 DIAGNOSIS — F411 Generalized anxiety disorder: Secondary | ICD-10-CM | POA: Insufficient documentation

## 2012-03-24 DIAGNOSIS — E785 Hyperlipidemia, unspecified: Secondary | ICD-10-CM | POA: Insufficient documentation

## 2012-03-24 DIAGNOSIS — Z794 Long term (current) use of insulin: Secondary | ICD-10-CM | POA: Insufficient documentation

## 2012-03-24 DIAGNOSIS — F329 Major depressive disorder, single episode, unspecified: Secondary | ICD-10-CM | POA: Insufficient documentation

## 2012-03-24 DIAGNOSIS — R32 Unspecified urinary incontinence: Secondary | ICD-10-CM | POA: Insufficient documentation

## 2012-03-24 DIAGNOSIS — N912 Amenorrhea, unspecified: Secondary | ICD-10-CM | POA: Insufficient documentation

## 2012-03-24 DIAGNOSIS — K219 Gastro-esophageal reflux disease without esophagitis: Secondary | ICD-10-CM | POA: Insufficient documentation

## 2012-03-24 DIAGNOSIS — N189 Chronic kidney disease, unspecified: Secondary | ICD-10-CM | POA: Insufficient documentation

## 2012-03-24 DIAGNOSIS — J029 Acute pharyngitis, unspecified: Secondary | ICD-10-CM

## 2012-03-24 DIAGNOSIS — R609 Edema, unspecified: Secondary | ICD-10-CM | POA: Insufficient documentation

## 2012-03-24 DIAGNOSIS — E119 Type 2 diabetes mellitus without complications: Secondary | ICD-10-CM | POA: Insufficient documentation

## 2012-03-24 DIAGNOSIS — M109 Gout, unspecified: Secondary | ICD-10-CM | POA: Insufficient documentation

## 2012-03-24 DIAGNOSIS — R002 Palpitations: Secondary | ICD-10-CM | POA: Insufficient documentation

## 2012-03-24 DIAGNOSIS — G609 Hereditary and idiopathic neuropathy, unspecified: Secondary | ICD-10-CM | POA: Insufficient documentation

## 2012-03-24 DIAGNOSIS — Z7982 Long term (current) use of aspirin: Secondary | ICD-10-CM | POA: Insufficient documentation

## 2012-03-24 DIAGNOSIS — F3289 Other specified depressive episodes: Secondary | ICD-10-CM | POA: Insufficient documentation

## 2012-03-24 DIAGNOSIS — Z930 Tracheostomy status: Secondary | ICD-10-CM | POA: Insufficient documentation

## 2012-03-24 LAB — URINALYSIS, ROUTINE W REFLEX MICROSCOPIC
Bilirubin Urine: NEGATIVE
Glucose, UA: NEGATIVE mg/dL
Hgb urine dipstick: NEGATIVE
Ketones, ur: NEGATIVE mg/dL
Leukocytes, UA: NEGATIVE
Nitrite: NEGATIVE
Protein, ur: NEGATIVE mg/dL
Specific Gravity, Urine: 1.02 (ref 1.005–1.030)
Urobilinogen, UA: 0.2 mg/dL (ref 0.0–1.0)
pH: 6 (ref 5.0–8.0)

## 2012-03-24 LAB — BASIC METABOLIC PANEL
BUN: 15 mg/dL (ref 6–23)
CO2: 32 mEq/L (ref 19–32)
Calcium: 9 mg/dL (ref 8.4–10.5)
Chloride: 92 mEq/L — ABNORMAL LOW (ref 96–112)
Creatinine, Ser: 0.8 mg/dL (ref 0.50–1.10)
GFR calc Af Amer: >90 mL/min (ref >90)
GFR calc non Af Amer: 85 mL/min — ABNORMAL LOW (ref >90)
Glucose, Bld: 236 mg/dL — ABNORMAL HIGH (ref 70–99)
Potassium: 3.2 mEq/L — ABNORMAL LOW (ref 3.5–5.1)
Sodium: 136 mEq/L (ref 135–145)

## 2012-03-24 LAB — CBC WITH DIFFERENTIAL/PLATELET
Basophils Absolute: 0 10*3/uL (ref 0.0–0.1)
Basophils Relative: 0 % (ref 0–1)
Eosinophils Absolute: 0.1 10*3/uL (ref 0.0–0.7)
Eosinophils Relative: 1 % (ref 0–5)
HCT: 34.4 % — ABNORMAL LOW (ref 36.0–46.0)
Hemoglobin: 11.5 g/dL — ABNORMAL LOW (ref 12.0–15.0)
Lymphocytes Relative: 25 % (ref 12–46)
Lymphs Abs: 2.4 10*3/uL (ref 0.7–4.0)
MCH: 30.7 pg (ref 26.0–34.0)
MCHC: 33.4 g/dL (ref 30.0–36.0)
MCV: 91.7 fL (ref 78.0–100.0)
Monocytes Absolute: 1.1 10*3/uL — ABNORMAL HIGH (ref 0.1–1.0)
Monocytes Relative: 11 % (ref 3–12)
Neutro Abs: 6.1 10*3/uL (ref 1.7–7.7)
Neutrophils Relative %: 63 % (ref 43–77)
Platelets: 130 10*3/uL — ABNORMAL LOW (ref 150–400)
RBC: 3.75 MIL/uL — ABNORMAL LOW (ref 3.87–5.11)
RDW: 14.8 % (ref 11.5–15.5)
WBC: 9.8 10*3/uL (ref 4.0–10.5)

## 2012-03-24 MED ORDER — HYDROCODONE-ACETAMINOPHEN 7.5-500 MG/15ML PO SOLN
10.0000 mL | Freq: Once | ORAL | Status: AC
  Administered 2012-03-24: 10 mL via ORAL
  Filled 2012-03-24: qty 15

## 2012-03-24 MED ORDER — PREDNISONE 50 MG PO TABS: 50.0000 mg | ORAL_TABLET | Freq: Every day | ORAL | Status: DC

## 2012-03-24 MED ORDER — ALBUTEROL SULFATE (5 MG/ML) 0.5% IN NEBU
5.0000 mg | INHALATION_SOLUTION | Freq: Once | RESPIRATORY_TRACT | Status: AC
  Administered 2012-03-24: 5 mg via RESPIRATORY_TRACT
  Filled 2012-03-24: qty 1

## 2012-03-24 MED ORDER — AZITHROMYCIN 250 MG PO TABS: ORAL_TABLET | ORAL | Status: DC

## 2012-03-24 MED ORDER — IPRATROPIUM BROMIDE 0.02 % IN SOLN
0.5000 mg | Freq: Once | RESPIRATORY_TRACT | Status: AC
  Administered 2012-03-24: 0.5 mg via RESPIRATORY_TRACT
  Filled 2012-03-24: qty 2.5

## 2012-03-24 MED ORDER — HYDROCOD POLST-CHLORPHEN POLST 10-8 MG/5ML PO LQCR: 5.0000 mL | Freq: Three times a day (TID) | ORAL | Status: DC | PRN

## 2012-03-24 NOTE — ED Provider Notes (Signed)
History     CSN: [id number]  Arrival date & time 03/24/12  1110   First MD Initiated Contact with Patient 03/24/12 1122      Chief Complaint  Patient presents with  . Influenza    (Consider location/radiation/quality/duration/timing/severity/associated sxs/prior treatment) HPI The patient presents the emergency department with influenza symptoms.  The patient reports she received her flu shot 2 weeks ago and hasn't been feeling well since.  Last Thursday she went to her PCP for worsening symptoms and had a negative strep test.  Saturday morning the patient woke up with dyspnea, productive cough, low grade fever, diarrhea, and sore throat.  She reports blood in her trach this morning with cough.  She reports diffuse chest pain from coughing.  She denies nausea, vomiting, and abdominal pain.    Past Medical History  Diagnosis Date  . OSA (obstructive sleep apnea)   . Morbid obesity   . Depressive disorder, not elsewhere classified   . Diabetes mellitus   . Asthma   . Dyslipidemia   . Clostridium difficile enterocolitis 2012  . Splenomegaly 06/01/2011  . Thrombocytopenia 06/01/2011  . Hepatosplenomegaly   . Gout   . GERD (gastroesophageal reflux disease)   . Osteoarthritis   . Polyneuropathy   . Urinary incontinence   . Amenorrhea   . CHF (congestive heart failure)   . Hyperlipidemia   . Leg swelling   . Nausea   . Weakness   . Palpitations   . PONV (postoperative nausea and vomiting)   . Anginal pain     no chest pain in years  . Dysrhythmia     heart palpations  . Anxiety   . Pneumonia   . Shortness of breath   . Chronic kidney disease     overactive bladder  . Peripheral neuropathy     Past Surgical History  Procedure Date  . Cholecystectomy   . Tracheostomy   . Endometrial ablation   . Skin graft   . Finger surgery     ring finger on right hand  . Esophageal dilation 2003  . Breast biopsy     needle core, left  . Dilation and curettage of uterus    times 2  . Tracheal dilitation 01/24/2012    Procedure: TRACHEAL DILITATION;  Surgeon: [name], MD;  Location: [facility] OR;  Service: ENT;  Laterality: N/A;  Trache change with possible dilation, from size 6 to size 7 uncuffed    Family History  Problem Relation Age of Onset  . Diabetes Father   . Hypertension Father   . Dementia Father   . Pneumonia Father   . Heart disease Mother   . Clotting disorder Mother   . Hypertension Mother   . Heart attack Mother   . Multiple myeloma Paternal Grandmother   . Cancer Paternal Grandmother     multiple myoloma  . Heart disease Paternal Grandfather   . Kidney disease Maternal Grandmother   . Hypertension Sister   . Cancer Paternal Aunt     breast  . Cancer Cousin     breast - all three    History  Substance Use Topics  . Smoking status: Never Smoker   . Smokeless tobacco: Never Used  . Alcohol Use: Yes     occasional - once or twice a year    OB History    Grav Para Term Preterm Abortions TAB SAB Ect Mult Living   0  Review of Systems All pertinent positives and negatives in the history of present illness   Allergies  Molds & smuts; Nitroglycerin er; Allopurinol; Amoxicillin-pot clavulanate; Ciprofloxacin; Codeine; Fexofenadine; Morphine; Sulfa drugs cross reactors; and Sulfonamide derivatives  Home Medications   Current Outpatient Rx  Name Route Sig Dispense Refill  . ACETAMINOPHEN 500 MG PO TABS Oral Take 1,000 mg by mouth as needed. For pain    . ALBUTEROL SULFATE (2.5 MG/3ML) 0.083% IN NEBU Nebulization Take 2.5 mg by nebulization every 4 (four) hours as needed. For shortness of breath    . ALPRAZOLAM 0.5 MG PO TABS Oral Take 0.5 mg by mouth 2 (two) times daily as needed. For anxiety    . ASPIRIN EC 81 MG PO TBEC Oral Take 81 mg by mouth daily.    . ATORVASTATIN CALCIUM 10 MG PO TABS Oral Take 10 mg by mouth at bedtime.     . AZITHROMYCIN 250 MG PO TABS  Take 2 tablets (500 mg) on  Day 1,  followed by 1  tablet (250 mg) once daily on Days 2 through 5. 6 each 0  . VITAMIN B COMPLEX PO Oral Take by mouth daily.    . BUDESONIDE 0.25 MG/2ML IN SUSP Nebulization Take 0.25 mg by nebulization 3 (three) times daily as needed. For shortness of breath.    . BUMETANIDE 1 MG PO TABS Oral Take 2 mg by mouth 2 (two) times daily.     . BUSPIRONE HCL 5 MG PO TABS Oral Take 7.5 mg by mouth 2 (two) times daily. \    . CHOLESTYRAMINE 4 G PO PACK Oral Take 1 packet by mouth as needed. For loose stools, IBS.    [name] COLCHICINE 0.6 MG PO TABS Oral Take 0.6 mg by mouth daily.    [name] DICYCLOMINE HCL 20 MG PO TABS Oral Take 20 mg by mouth 2 (two) times daily.      . DULOXETINE HCL 30 MG PO CPEP Oral Take 30 mg by mouth 2 (two) times daily.      . ERGOCALCIFEROL 50000 UNITS PO CAPS Oral Take 50,000 Units by mouth once a week. Take on Fridays    . FEBUXOSTAT 80 MG PO TABS Oral Take by mouth daily.    . FESOTERODINE FUMARATE ER 4 MG PO TB24 Oral Take 4 mg by mouth daily.      . OMEGA-3 FATTY ACIDS 1000 MG PO CAPS Oral Take 2 capsules by mouth daily.     [name] GABAPENTIN 100 MG PO CAPS Oral Take 100 mg by mouth 4 (four) times daily.     . GUAIFENESIN ER 600 MG PO TB12 Oral Take 1,200 mg by mouth 2 (two) times daily.      [name] HYDROCODONE-ACETAMINOPHEN 5-500 MG PO TABS Oral Take 1 tablet by mouth every 6 (six) hours as needed. For pain    . INSULIN ASPART 100 UNIT/ML [place] SOLN Subcutaneous Inject 16 Units into the skin 3 (three) times daily before meals.     . INSULIN GLARGINE 100 UNIT/ML [place] SOLN Subcutaneous Inject 70 Units into the skin 2 (two) times daily.     [name] LIRAGLUTIDE 18 MG/3ML  SOLN Subcutaneous Inject 1.8 mLs into the skin every morning.    [name] LORATADINE 10 MG PO TABS Oral Take 10 mg by mouth daily as needed.     [name] MECLIZINE HCL 25 MG PO TABS Oral Take 25 mg by mouth daily.    [name] METOLAZONE 5 MG PO TABS Oral Take 5  mg by mouth every other day.    [name] MOXIFLOXACIN HCL 400 MG PO TABS Oral Take 400 mg by mouth daily. Take for 10  days.  First dose due 01/31/2012.    . MULTIVITAMINS PO CAPS Oral Take 1 capsule by mouth daily.      . NEBIVOLOL HCL 10 MG PO TABS Oral Take 10 mg by mouth daily.      [name] OMEPRAZOLE 40 MG PO CPDR Oral Take 40 mg by mouth 2 (two) times daily.     [name] POLYETHYLENE GLYCOL 3350 PO PACK Oral Take 17 g by mouth daily as needed. For constipation.    [name] POTASSIUM CHLORIDE CRYS ER 20 MEQ PO TBCR Oral Take 20 mEq by mouth 3 (three) times daily.     [name] PREDNISONE 10 MG PO TABS  4 X 2 DAYS, 3 X 2 DAYS, 2 X 2 DAYS, 1 X 2 DAYS 20 tablet 0  . PRIMIDONE 50 MG PO TABS Oral Take 25-75 mg by mouth 2 (two) times daily. Take 25 MG in the morning and 75 MG at bedtime.    [name] PROBIOTIC PO CAPS Oral Take 1 capsule by mouth daily.     [name] SIMETHICONE 180 MG PO CAPS Oral Take 1 capsule by mouth daily as needed. For gas    . TOPIRAMATE 25 MG PO TABS Oral Take 1 tablet (25 mg total) by mouth daily.    [name] VALSARTAN 160 MG PO TABS Oral Take 160 mg by mouth daily.      . WARFARIN SODIUM 7.5 MG PO TABS Oral Take 5-7.5 mg by mouth daily. As directed. Take the 5mg  mon, tues, thurs, and fridays, and 7.5mg  rest of days    . ZAFIRLUKAST 20 MG PO TABS Oral Take 1 tablet (20 mg total) by mouth 2 (two) times daily. 60 tablet 5    BP 125/42  Pulse 75  Temp 98.6 F (37 C) (Oral)  Resp 20  SpO2 91%  Physical Exam  Nursing note and vitals reviewed. Constitutional: She is oriented to person, place, and time. She appears well-developed and well-nourished. No distress.  HENT:  Head: Normocephalic and atraumatic. No trismus in the jaw.  Mouth/Throat: Mucous membranes are normal. No uvula swelling. Posterior oropharyngeal edema and posterior oropharyngeal erythema present. No oropharyngeal exudate or tonsillar abscesses.  Eyes: Pupils are equal, round, and reactive to light.  Neck: Normal range of motion. Neck supple.  Cardiovascular: Normal rate, regular rhythm and normal heart sounds.  Exam reveals no gallop and no friction rub.   No murmur  heard. Pulmonary/Chest: Effort normal.       The patient has congested lung sounds but no wheezing or rhonchi.  Abdominal: Soft. Bowel sounds are normal.  Neurological: She is alert and oriented to person, place, and time.  Skin: Skin is warm and dry. No rash noted.    ED Course  Procedures (including critical care time)  Labs Reviewed  CBC WITH DIFFERENTIAL - Abnormal; Notable for the following:    RBC 3.75 (*)     Hemoglobin 11.5 (*)     HCT 34.4 (*)     Platelets 130 (*)     Monocytes Absolute 1.1 (*)     All other components within normal limits  BASIC METABOLIC PANEL - Abnormal; Notable for the following:    Potassium 3.2 (*)     Chloride 92 (*)     Glucose, Bld 236 (*)     GFR calc non Af Amer 85 (*)  All other components within normal limits  URINALYSIS, ROUTINE W REFLEX MICROSCOPIC   Dg Chest 2 View  03/24/2012  *RADIOLOGY REPORT*  Clinical Data: Cough.  Shortness of breath.  CHEST - 2 VIEW  Comparison: Two-view chest 01/31/2012.  Findings: Cardiac enlargement is stable.  There is no edema or effusion to suggest failure.  Tracheostomy tube is stable.  Mild degenerative changes in the thoracic spine are unchanged.  IMPRESSION:  1.  Stable cardiomegaly without failure.   Original Report Authenticated By: Jamesetta Orleans. MATTERN, [name]     The patient will be treated for URI based on her HPI and PE. The patient will be referred back to her PCP. Told to return here as needed.    MDM  MDM Reviewed: nursing note and vitals Interpretation: labs and x-ray            Carlyle Dolly, PA-C 03/24/12 1641

## 2012-03-24 NOTE — Telephone Encounter (Signed)
Noted  

## 2012-03-24 NOTE — Telephone Encounter (Signed)
I spoke with pt and she stated she is not feeling any  Better since she received her flu shot last week. C/o PND, she has blood in her trach, increase SOB, scratchy/raw throat. She stated the symptoms started getting worse on Saturday. She called on call doc this weekend and was giving zpak and pred taper. Stated this is the 3rd day and is not any better. I offered pt an appt to be seen today but refused stated she can only come when her caregiver can bring her. I also offered appt tomorrow but declined bc her caregiver was not able to bring her in. She stated she has no one else to bring her in for OV. She stated she will call her caregiver to see if she can drop her off at the [facility] ED for an evaluation. Will forward to VS so he is aware of this.

## 2012-03-24 NOTE — Progress Notes (Signed)
RT set up aerosol trach mask for patient.  She uses oxygen when resting and as needed at home.

## 2012-03-24 NOTE — ED Notes (Signed)
To ED from home via EMS, pt reports 2w of flu like symptoms, also reports nausea and diarrhea, trach in place since 2001, sts she has been coughing up blood thru trach, RT to bedside, VSS, NAD

## 2012-03-27 ENCOUNTER — Encounter: Payer: Self-pay | Admitting: Pulmonary Disease

## 2012-03-27 ENCOUNTER — Ambulatory Visit (INDEPENDENT_AMBULATORY_CARE_PROVIDER_SITE_OTHER): Payer: Medicare Other | Admitting: Pulmonary Disease

## 2012-03-27 ENCOUNTER — Telehealth: Payer: Self-pay | Admitting: *Deleted

## 2012-03-27 VITALS — BP 128/68 | HR 78 | Temp 98.4°F | Ht 68.0 in | Wt >= 6400 oz

## 2012-03-27 DIAGNOSIS — Z930 Tracheostomy status: Secondary | ICD-10-CM

## 2012-03-27 DIAGNOSIS — R042 Hemoptysis: Secondary | ICD-10-CM | POA: Insufficient documentation

## 2012-03-27 DIAGNOSIS — G4733 Obstructive sleep apnea (adult) (pediatric): Secondary | ICD-10-CM

## 2012-03-27 DIAGNOSIS — E678 Other specified hyperalimentation: Secondary | ICD-10-CM

## 2012-03-27 DIAGNOSIS — J45909 Unspecified asthma, uncomplicated: Secondary | ICD-10-CM

## 2012-03-27 MED ORDER — HYDROCODONE-ACETAMINOPHEN 7.5-325 MG/15ML PO SOLN: 10.0000 mL | Freq: Four times a day (QID) | ORAL | Status: DC | PRN

## 2012-03-27 MED ORDER — IPRATROPIUM BROMIDE 0.02 % IN SOLN: 500.0000 ug | Freq: Four times a day (QID) | RESPIRATORY_TRACT | Status: DC

## 2012-03-27 NOTE — Assessment & Plan Note (Signed)
She is slowly improving from recent asthmatic bronchitis.  I don't think she needs additional prednisone or antibiotics.  She is to continue her current regimen, and will add atrovent nebulizer.

## 2012-03-27 NOTE — Patient Instructions (Signed)
Don't take coumadin or aspirin until Sunday October 6 Follow up in 3 months

## 2012-03-27 NOTE — Assessment & Plan Note (Signed)
She is to continue tracheostomy with nocturnal vent.

## 2012-03-27 NOTE — Telephone Encounter (Signed)
Please inform patient that order sent for atrovent, and I have sent my office note to Dr. [name].

## 2012-03-27 NOTE — Assessment & Plan Note (Signed)
Continue nocturnal vent.

## 2012-03-27 NOTE — Assessment & Plan Note (Signed)
Advised her to f/u with ENT if her hemoptysis persists.

## 2012-03-27 NOTE — Telephone Encounter (Signed)
Pt advised. Jennifer Castillo, CMA  

## 2012-03-27 NOTE — Assessment & Plan Note (Signed)
Likely related to asthmatic bronchitis.  She is slowly improving.  Her recent chest xray was unremarkable.  I have advised her to hold her aspirin and coumadin from today until Sunday, October 6.  She can then resume her medications on October 6.

## 2012-03-27 NOTE — Progress Notes (Signed)
Chief Complaint  Patient presents with  . Follow-up    pt states increased sob, wheezing with cough and sharp pains in chest.  Was seen in ER Monday 03/24/12  for sob and hemoptysis. Patient also would like to have cough syrup refilled.   CC: [name]  History of Present Illness: [name] is a 51 y.o. female with OSA/OHS s/p trach on nocturnal vent, Asthma.  She was treated with a Zpak and prednisone over the past several days for asthmatic bronchitis.  She is not wheezing now.  She had to go to the ER.  Her CXR was unremarkable.  She was given an albuterol/atrovent treatment and this worked well.  She still has cough with bloody sputum.  She is not having chest pain.  Her sinuses are doing better.  She got the flu shot two weeks ago and started feeling sick after this.   Past Medical History  Diagnosis Date  . OSA (obstructive sleep apnea)   . Morbid obesity   . Depressive disorder, not elsewhere classified   . Diabetes mellitus   . Asthma   . Dyslipidemia   . Clostridium difficile enterocolitis 2012  . Splenomegaly 06/01/2011  . Thrombocytopenia 06/01/2011  . Hepatosplenomegaly   . Gout   . GERD (gastroesophageal reflux disease)   . Osteoarthritis   . Polyneuropathy   . Urinary incontinence   . Amenorrhea   . CHF (congestive heart failure)   . Hyperlipidemia   . Leg swelling   . Nausea   . Weakness   . Palpitations   . PONV (postoperative nausea and vomiting)   . Anginal pain     no chest pain in years  . Dysrhythmia     heart palpations  . Anxiety   . Pneumonia   . Shortness of breath   . Chronic kidney disease     overactive bladder  . Peripheral neuropathy     Past Surgical History  Procedure Date  . Cholecystectomy   . Tracheostomy   . Endometrial ablation   . Skin graft   . Finger surgery     ring finger on right hand  . Esophageal dilation 2003  . Breast biopsy     needle core, left  . Dilation and curettage of uterus     times 2  .  Tracheal dilitation 01/24/2012    Procedure: TRACHEAL DILITATION;  Surgeon: [name], MD;  Location: [facility] OR;  Service: ENT;  Laterality: N/A;  Trache change with possible dilation, from size 6 to size 7 uncuffed    Outpatient Encounter Prescriptions as of 03/27/2012  Medication Sig Dispense Refill  . acetaminophen (TYLENOL) 500 MG tablet Take 1,000 mg by mouth as needed. For pain      . albuterol (PROVENTIL) (2.5 MG/3ML) 0.083% nebulizer solution Take 2.5 mg by nebulization every 4 (four) hours as needed. For shortness of breath      . ALPRAZolam (XANAX) 0.5 MG tablet Take 0.5 mg by mouth 2 (two) times daily as needed. For anxiety      . aspirin EC 81 MG tablet Take 81 mg by mouth daily.      [name] atorvastatin (LIPITOR) 10 MG tablet Take 10 mg by mouth at bedtime.       [name] azithromycin (ZITHROMAX) 250 MG tablet Take 250-500 mg by mouth daily. Take 2 tablets (500 mg) on  Day 1,  followed by 1 tablet (250 mg) once daily on Days 2 through 5.      [name]  azithromycin (ZITHROMAX) 250 MG tablet 2 PO day one then 1 PO day 2-5  6 tablet  0  . B Complex Vitamins (VITAMIN B COMPLEX PO) Take by mouth daily.      . budesonide (PULMICORT) 0.25 MG/2ML nebulizer solution Take 0.25 mg by nebulization 3 (three) times daily as needed. For shortness of breath.      . bumetanide (BUMEX) 1 MG tablet Take 2 mg by mouth 2 (two) times daily.       . busPIRone (BUSPAR) 5 MG tablet Take 7.5 mg by mouth 2 (two) times daily. \      . cholestyramine (QUESTRAN) 4 G packet Take 1 packet by mouth as needed. For loose stools, IBS.      [name] colchicine 0.6 MG tablet Take 0.6 mg by mouth daily.      [name] dicyclomine (BENTYL) 20 MG tablet Take 20 mg by mouth 2 (two) times daily.        . DULoxetine (CYMBALTA) 30 MG capsule Take 30 mg by mouth 2 (two) times daily.        . ergocalciferol (VITAMIN D2) 50000 UNITS capsule Take 50,000 Units by mouth once a week. Take on Fridays      . Febuxostat (ULORIC) 80 MG TABS Take by mouth daily.      .  fesoterodine (TOVIAZ) 4 MG TB24 Take 4 mg by mouth daily.        . fish oil-omega-3 fatty acids 1000 MG capsule Take 2 capsules by mouth daily.       [name] gabapentin (NEURONTIN) 100 MG capsule Take 100 mg by mouth 4 (four) times daily.       [name] guaiFENesin (MUCINEX) 600 MG 12 hr tablet Take 1,200 mg by mouth 2 (two) times daily.        [name] HYDROcodone-acetaminophen (LORTAB) 7.5-500 MG/15ML solution Take 10 mLs by mouth every 6 (six) hours as needed.      [name] HYDROcodone-acetaminophen (VICODIN) 5-500 MG per tablet Take 1 tablet by mouth every 6 (six) hours as needed. For pain      . insulin aspart (NOVOLOG) 100 UNIT/ML injection Inject 16 Units into the skin 3 (three) times daily before meals.       . insulin glargine (LANTUS) 100 UNIT/ML injection Inject 70 Units into the skin 2 (two) times daily.       . Liraglutide (VICTOZA) 18 MG/3ML SOLN Inject 1.8 mLs into the skin every morning.      . loratadine (CLARITIN) 10 MG tablet Take 10 mg by mouth daily as needed.       . meclizine (ANTIVERT) 25 MG tablet Take 25 mg by mouth daily.      . metolazone (ZAROXOLYN) 5 MG tablet Take 5 mg by mouth every other day.      . Multiple Vitamin (MULTIVITAMIN) capsule Take 1 capsule by mouth daily.        . nebivolol (BYSTOLIC) 10 MG tablet Take 10 mg by mouth daily.        [name] omeprazole (PRILOSEC) 40 MG capsule Take 40 mg by mouth 2 (two) times daily.       . polyethylene glycol (MIRALAX / GLYCOLAX) packet Take 17 g by mouth daily as needed. For constipation.      . potassium chloride SA (K-DUR,KLOR-CON) 20 MEQ tablet Take 20 mEq by mouth 3 (three) times daily.       . predniSONE (DELTASONE) 10 MG tablet Take 10-40 mg by mouth See admin instructions. 4 X  2 DAYS, 3 X 2 DAYS, 2 X 2 DAYS, 1 X 2 DAYS      . primidone (MYSOLINE) 50 MG tablet Take 25-75 mg by mouth 2 (two) times daily. Take 25 MG in the morning and 75 MG at bedtime.      [name] PROBIOTIC CAPS Take 1 capsule by mouth daily.       [name] topiramate (TOPAMAX) 25 MG tablet  Take 1 tablet (25 mg total) by mouth daily.      . valsartan (DIOVAN) 160 MG tablet Take 160 mg by mouth daily.        [name] warfarin (COUMADIN) 7.5 MG tablet Take 5-7.5 mg by mouth daily. As directed. Take the 5mg  mon, tues, thurs, and fridays, and 7.5mg  rest of days      . DISCONTD: azithromycin (ZITHROMAX Z-PAK) 250 MG tablet Take 2 tablets (500 mg) on  Day 1,  followed by 1 tablet (250 mg) once daily on Days 2 through 5.  6 each  0  . DISCONTD: chlorpheniramine-HYDROcodone (TUSSIONEX) 10-8 MG/5ML LQCR Take 5 mLs by mouth 3 (three) times daily as needed.  140 mL  0  . DISCONTD: moxifloxacin (AVELOX) 400 MG tablet Take 400 mg by mouth daily. Take for 10 days.  First dose due 01/31/2012.      [name] DISCONTD: predniSONE (DELTASONE) 10 MG tablet 4 X 2 DAYS, 3 X 2 DAYS, 2 X 2 DAYS, 1 X 2 DAYS  20 tablet  0  . DISCONTD: predniSONE (DELTASONE) 50 MG tablet Take 1 tablet (50 mg total) by mouth daily.  5 tablet  0  . DISCONTD: Simethicone 180 MG CAPS Take 1 capsule by mouth daily as needed. For gas      . DISCONTD: zafirlukast (ACCOLATE) 20 MG tablet Take 1 tablet (20 mg total) by mouth 2 (two) times daily.  60 tablet  5    Allergies  Allergen Reactions  . Molds & Smuts Shortness Of Breath and Rash  . Nitroglycerin Er Nausea And Vomiting  . Accolate (Zafirlukast) Other (See Comments)    Reaction unknown  . Allopurinol     GI  . Amoxicillin-Pot Clavulanate     unknown  . Ciprofloxacin     GI problems  . Codeine     Heart problems  . Fexofenadine     Hurts joints  . Morphine     unknown  . Sulfa Drugs Cross Reactors Nausea And Vomiting    nausea  . Sulfonamide Derivatives     nausea    Physical Exam:  Filed Vitals:   03/27/12 0959  BP: 128/68  Pulse: 78  Temp: 98.4 F (36.9 C)  TempSrc: Oral  Height: 5\' 8"  (1.727 m)  Weight: 412 lb 12.8 oz (187.245 kg)  SpO2: 96%    Body mass index is 62.77 kg/(m^2). Wt Readings from Last 2 Encounters:  03/27/12 412 lb 12.8 oz (187.245 kg)    03/04/12 417 lb 9.6 oz (189.422 kg)    General - Obese ENT - no sinus tenderness, clear nasal discharge, no oral exudate, trach site clean Cardiac - s1s2 regular, no murmur, pulses symmetric, no edema Chest - prolonged exhalation, no wheeze Back - no focal tenderness Abd - soft, non-tender, + bowel sounds Ext - normal motor strength Neuro - Cranial nerves are normal. PERLA. EOM's intact. Skin - chronic venous stasis changes Psych - normal mood, and behavior.  Dg Chest 2 View  03/24/2012  *RADIOLOGY REPORT*  Clinical Data: Cough.  Shortness of breath.  CHEST - 2 VIEW  Comparison: Two-view chest 01/31/2012.  Findings: Cardiac enlargement is stable.  There is no edema or effusion to suggest failure.  Tracheostomy tube is stable.  Mild degenerative changes in the thoracic spine are unchanged.  IMPRESSION:  1.  Stable cardiomegaly without failure.   Original Report Authenticated By: Jamesetta Orleans. MATTERN, [name]    Lab Results  Component Value Date   CREATININE 0.80 03/24/2012   BUN 15 03/24/2012   NA 136 03/24/2012   K 3.2* 03/24/2012   CL 92* 03/24/2012   CO2 32 03/24/2012   Lab Results  Component Value Date   WBC 9.8 03/24/2012   HGB 11.5* 03/24/2012   HCT 34.4* 03/24/2012   MCV 91.7 03/24/2012   PLT 130* 03/24/2012   Lab Results  Component Value Date   ALT 34 01/05/2012   AST 46* 01/05/2012   ALKPHOS 50 01/05/2012   BILITOT 0.3 01/05/2012   Lab Results  Component Value Date   INR 1.10 01/24/2012   INR 1.45 01/21/2012   INR 1.66* 01/05/2012     Assessment/Plan:  Bianca Helling, MD [place] [facility]/Sleep Pager:  [phone] 03/27/2012, 10:07 AM

## 2012-03-27 NOTE — Telephone Encounter (Signed)
Patient was seen today in our office had a question in regards to a medication that was given to her in the ER when she was at [facility]. Patient states that while in the hospital they gave her Atrovent in a neb to help with her SOB and she thought that it did very well; she is wondering if she can get a Rx for this sent to her pharmacy to use at home. Patients pharmacy: [facility], [place] - [address] Please advise Dr Craige Cotta, Thanks.    Pt also wants to make sure that you send a report of her visit today to PCP Dr [name].   Allergies  Allergen Reactions  . Molds & Smuts Shortness Of Breath and Rash  . Nitroglycerin Er Nausea And Vomiting  . Accolate (Zafirlukast) Other (See Comments)    Reaction unknown  . Allopurinol     GI  . Amoxicillin-Pot Clavulanate     unknown  . Ciprofloxacin     GI problems  . Codeine     Heart problems  . Fexofenadine     Hurts joints  . Morphine     unknown  . Sulfa Drugs Cross Reactors Nausea And Vomiting    nausea  . Sulfonamide Derivatives     nausea

## 2012-03-28 NOTE — ED Provider Notes (Signed)
Medical screening examination/treatment/procedure(s) were performed by non-physician practitioner and as supervising physician I was immediately available for consultation/collaboration.  Andrews Tener, MD 03/28/12 1941 

## 2012-04-25 ENCOUNTER — Telehealth: Payer: Self-pay | Admitting: Obstetrics and Gynecology

## 2012-04-25 NOTE — Telephone Encounter (Signed)
Pt has appt w/ [facility] Wed Nov 6th @ 9am. Pt agreeable  [facility] CMA

## 2012-04-30 ENCOUNTER — Other Ambulatory Visit: Payer: Self-pay

## 2012-04-30 ENCOUNTER — Encounter: Payer: Self-pay | Admitting: Obstetrics and Gynecology

## 2012-04-30 ENCOUNTER — Ambulatory Visit (INDEPENDENT_AMBULATORY_CARE_PROVIDER_SITE_OTHER): Payer: Medicare Other | Admitting: Obstetrics and Gynecology

## 2012-04-30 VITALS — BP 124/80 | Ht 68.0 in | Wt >= 6400 oz

## 2012-04-30 DIAGNOSIS — L0291 Cutaneous abscess, unspecified: Secondary | ICD-10-CM

## 2012-04-30 MED ORDER — CEPHALEXIN 500 MG PO CAPS: 500.0000 mg | ORAL_CAPSULE | Freq: Three times a day (TID) | ORAL | Status: AC

## 2012-04-30 NOTE — Progress Notes (Signed)
HISTORY OF PRESENT ILLNESS  Ms. [name] is a 51 y.o. year old female,G0P0, who presents for a problem visit. The patient has a BMI of 64.  She was hospitalized with a skin abscess several weeks ago.  Subjective:  The patient complains of tenderness and a cyst at her female organs similar to her last episode.  She reports that the area had started to drain on its own.  Objective:  BP 124/80  Ht 5\' 8"  (1.727 m)  Wt 421 lb (190.964 kg)  BMI 64.01 kg/m2   GI: the patient has a massive panniculus.  She has a draining skin abscess on her panniculus that is not related to her genitalia.  There is no redness.  There is slight induration.  The area was cleaned with peroxide and then sterilely draped.  There is no evidence of purulent drainage.  The cavity was probed and was only one half of a centimeter deep.  Exam deferred.  Assessment:  Skin abscess. Massive panniculus. 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  Plan:  Keflex 500 mg 3 times each day for 7 days.  The patient will follow up with her family physician.  The patient reports that she is able to take ampicillin and Keflex without problems.  She will clean the area daily with peroxide.She will try to keep the area dry and apply antibiotic ointment after each bath.  Return to office prn if symptoms worsen or fail to improve.   Leonard Schwartz [name]  04/30/2012 9:57 AM

## 2012-05-02 ENCOUNTER — Telehealth: Payer: Self-pay

## 2012-05-02 NOTE — Telephone Encounter (Signed)
Pt called back regarding her request for Flagyl. Pt stated she was in the hospital before w/ C.Diff The doctors advised her that anytime she was taking an antibiotic she should have flagyl prescribed as well. Pt made aware that I would consult w/ AVS.  [facility] CMA

## 2012-05-08 ENCOUNTER — Other Ambulatory Visit: Payer: Self-pay

## 2012-05-08 ENCOUNTER — Telehealth: Payer: Self-pay

## 2012-05-08 MED ORDER — METRONIDAZOLE 500 MG PO TABS: 500.0000 mg | ORAL_TABLET | Freq: Two times a day (BID) | ORAL | Status: AC

## 2012-05-08 NOTE — Telephone Encounter (Signed)
Metronidazole 500 mg bid for 7 days  Was called into [facility] per AVS Pt will be called to be made aware.  [facility] CMA

## 2012-05-08 NOTE — Telephone Encounter (Signed)
Pt made awareRx Flagyl was called in . Pt agreeable and asked that we use her Home Number instead of her cell.  [facility] CMA

## 2012-06-04 ENCOUNTER — Telehealth: Payer: Self-pay | Admitting: Dietician

## 2012-06-04 NOTE — Telephone Encounter (Signed)
Diabetes Education:  Received results of blood work taken on 05/30/2012.  A1C was at 7.9% with a fasting glucose of 277.  Triglyceride level at 336 mg/dl.  Has a variety of other metabolic studies that are not WNL.  Will review glucose issues at next appointment.  Her Current eGFR is at 68 which is much less that the >60 normal range

## 2012-06-09 ENCOUNTER — Ambulatory Visit: Payer: Medicare Other | Admitting: Adult Health

## 2012-06-09 ENCOUNTER — Other Ambulatory Visit: Payer: Medicare Other | Admitting: Lab

## 2012-06-10 ENCOUNTER — Encounter: Payer: Self-pay | Admitting: Dietician

## 2012-06-10 ENCOUNTER — Encounter: Payer: Medicare Other | Attending: Family Medicine | Admitting: Dietician

## 2012-06-10 DIAGNOSIS — Z713 Dietary counseling and surveillance: Secondary | ICD-10-CM | POA: Insufficient documentation

## 2012-06-10 DIAGNOSIS — E119 Type 2 diabetes mellitus without complications: Secondary | ICD-10-CM | POA: Insufficient documentation

## 2012-06-10 NOTE — Patient Instructions (Addendum)
   For decreasing blood glucose to prevent so much damage to the kidneys, try keeping the carb at 15-30 gm for each meal and 15 for a snack.  Since I ran late with you today.  Can we have a telephone visit next Monday.  At 1:00-2:00 PM?  Plan to check blood glucose.

## 2012-06-10 NOTE — Progress Notes (Addendum)
  Medical Nutrition Therapy:  Appt start time: 1100 end time:  1130.  Assessment:  Primary concerns today: Wants to know how to improve her kidney function.  Concerned that she might need to see a kidney specialist. Today, her new referral is Morbid Obesity with uncontrolled diabetes.  Her eGFR on 05/30/2012 was 47 which is less than the >60 normal range.    BLOOD GLUCOSE;    Fasting: 310, 161,096,045, 409,811  Before Dinner: 320, 171,228,240,275,271  MEDICATIONS: Completed review of medications.  DM meds: Lantus insulin at 70 units in the AM and 74 units in the PM.  These are alternated each day.  Currently to increase the dose 2 units of Lantus each week until the blood glucose is decreased.  Continues with Novolog at 16 units AC each meal.  DIETARY INTAKE:  24-hr recall:  B (9:00 AM): eggs, meat, starch bread (2) or cereal and milk  Snk ( AM) :None  L (mid PM): 1:30-2:00 pack of crackers and PB and a cheese stick. And a piece of fruit on some days.. Snk (Mid- PM): None D (6:00 PM): meat, cabbage, few mashed potatoes and a few beets and slice of meat loaf.   Snk (HS- PM): Maybe a piece of fruit Beverages: Water diet beverages  Recent physical activity: ADL with the assistance of her caregivers.  Teyonna gets SOB just pushing her walker from room to room.  Estimated energy needs:  HT: 68 in  WT: 404.7 lb  BMI:61.7 kg/m2  Adj. WT: 200 lb (91 kg). 1200-1300 calories for weight loss 90 g carbohydrates 100-110 g protein 40-43 g fat  Progress Towards Goal(s):  In progress.   Nutritional Diagnosis:  [place]-3.3 Overweight/obesity As related to limited mobility, lifestyle high in stress, history of high carbohydrate intake, and uncontrolled blood glucose levels.  As evidenced by weight at 404.7 lb, BMI at 61.7 kg/m2, increased blood glucose with periods of increased stress, fasting blood glucose levels >200 mg/dl, B1Y at 7.8%..    Intervention:  Nutrition Review of current intake reveals that  for celebration of the holiday season, she attended her lodge meeting and ate normal.  Her regular daily intake recall, indicates that she is eating more at the mid-day time.  She is generally limiting sweets and starch/carb on non-holiday times or celebrations.  Recommended that she strive to keep the protein and the fat in her diet, but to restrict the carb to 15-30 gm for meals and 15 gm for snacks.  Continue to check her blood glucose levels.  Will plan to speak with her by phone on 06/16/2012 to see if the lower carb level is effecting the blood glucose levels.  Handouts given during visit include:  Print out of the information regarding hCG injections and the detrimental way that it effects the body and does not lead to weight loss.  Monitoring/Evaluation:  Dietary intake, exercise, blood glucose levels, and body weight in 8-12 weeks.  Will plan to phone to see how the carb regimen is working in 7 days.[name]

## 2012-07-14 ENCOUNTER — Other Ambulatory Visit: Payer: Self-pay | Admitting: Neurology

## 2012-07-14 DIAGNOSIS — M542 Cervicalgia: Secondary | ICD-10-CM

## 2012-07-23 ENCOUNTER — Other Ambulatory Visit: Payer: Medicaid Other | Admitting: Lab

## 2012-07-23 ENCOUNTER — Ambulatory Visit: Payer: Medicaid Other | Admitting: Oncology

## 2012-07-23 ENCOUNTER — Other Ambulatory Visit: Payer: Self-pay | Admitting: Emergency Medicine

## 2012-07-23 DIAGNOSIS — D696 Thrombocytopenia, unspecified: Secondary | ICD-10-CM

## 2012-07-24 ENCOUNTER — Other Ambulatory Visit: Payer: Medicaid Other

## 2012-07-27 ENCOUNTER — Emergency Department (HOSPITAL_COMMUNITY): Payer: Medicare Other

## 2012-07-27 ENCOUNTER — Encounter (HOSPITAL_COMMUNITY): Payer: Self-pay | Admitting: Emergency Medicine

## 2012-07-27 ENCOUNTER — Emergency Department (HOSPITAL_COMMUNITY)
Admission: EM | Admit: 2012-07-27 | Discharge: 2012-07-27 | Disposition: A | Discharge status: Home / Self Care | Payer: Medicare Other | Attending: Emergency Medicine | Admitting: Emergency Medicine

## 2012-07-27 DIAGNOSIS — Z87448 Personal history of other diseases of urinary system: Secondary | ICD-10-CM | POA: Insufficient documentation

## 2012-07-27 DIAGNOSIS — Z8669 Personal history of other diseases of the nervous system and sense organs: Secondary | ICD-10-CM | POA: Insufficient documentation

## 2012-07-27 DIAGNOSIS — Z8739 Personal history of other diseases of the musculoskeletal system and connective tissue: Secondary | ICD-10-CM | POA: Insufficient documentation

## 2012-07-27 DIAGNOSIS — Z79899 Other long term (current) drug therapy: Secondary | ICD-10-CM | POA: Insufficient documentation

## 2012-07-27 DIAGNOSIS — Z8719 Personal history of other diseases of the digestive system: Secondary | ICD-10-CM | POA: Insufficient documentation

## 2012-07-27 DIAGNOSIS — S91109A Unspecified open wound of unspecified toe(s) without damage to nail, initial encounter: Secondary | ICD-10-CM | POA: Insufficient documentation

## 2012-07-27 DIAGNOSIS — N189 Chronic kidney disease, unspecified: Secondary | ICD-10-CM | POA: Insufficient documentation

## 2012-07-27 DIAGNOSIS — Y9301 Activity, walking, marching and hiking: Secondary | ICD-10-CM | POA: Insufficient documentation

## 2012-07-27 DIAGNOSIS — F3289 Other specified depressive episodes: Secondary | ICD-10-CM | POA: Insufficient documentation

## 2012-07-27 DIAGNOSIS — J45909 Unspecified asthma, uncomplicated: Secondary | ICD-10-CM | POA: Insufficient documentation

## 2012-07-27 DIAGNOSIS — E119 Type 2 diabetes mellitus without complications: Secondary | ICD-10-CM | POA: Insufficient documentation

## 2012-07-27 DIAGNOSIS — Z794 Long term (current) use of insulin: Secondary | ICD-10-CM | POA: Insufficient documentation

## 2012-07-27 DIAGNOSIS — Z8701 Personal history of pneumonia (recurrent): Secondary | ICD-10-CM | POA: Insufficient documentation

## 2012-07-27 DIAGNOSIS — W010XXA Fall on same level from slipping, tripping and stumbling without subsequent striking against object, initial encounter: Secondary | ICD-10-CM | POA: Insufficient documentation

## 2012-07-27 DIAGNOSIS — E785 Hyperlipidemia, unspecified: Secondary | ICD-10-CM | POA: Insufficient documentation

## 2012-07-27 DIAGNOSIS — Y92009 Unspecified place in unspecified non-institutional (private) residence as the place of occurrence of the external cause: Secondary | ICD-10-CM | POA: Insufficient documentation

## 2012-07-27 DIAGNOSIS — Z23 Encounter for immunization: Secondary | ICD-10-CM | POA: Insufficient documentation

## 2012-07-27 DIAGNOSIS — F329 Major depressive disorder, single episode, unspecified: Secondary | ICD-10-CM | POA: Insufficient documentation

## 2012-07-27 DIAGNOSIS — S91115A Laceration without foreign body of left lesser toe(s) without damage to nail, initial encounter: Secondary | ICD-10-CM

## 2012-07-27 DIAGNOSIS — F411 Generalized anxiety disorder: Secondary | ICD-10-CM | POA: Insufficient documentation

## 2012-07-27 DIAGNOSIS — Z8679 Personal history of other diseases of the circulatory system: Secondary | ICD-10-CM | POA: Insufficient documentation

## 2012-07-27 DIAGNOSIS — Z8619 Personal history of other infectious and parasitic diseases: Secondary | ICD-10-CM | POA: Insufficient documentation

## 2012-07-27 DIAGNOSIS — Z7901 Long term (current) use of anticoagulants: Secondary | ICD-10-CM | POA: Insufficient documentation

## 2012-07-27 DIAGNOSIS — I509 Heart failure, unspecified: Secondary | ICD-10-CM | POA: Insufficient documentation

## 2012-07-27 MED ORDER — HYDROCODONE-ACETAMINOPHEN 5-325 MG PO TABS: 1.0000 | ORAL_TABLET | ORAL | Status: DC | PRN

## 2012-07-27 MED ORDER — CEPHALEXIN 500 MG PO CAPS: 500.0000 mg | ORAL_CAPSULE | Freq: Two times a day (BID) | ORAL | Status: DC

## 2012-07-27 MED ORDER — TETANUS-DIPHTH-ACELL PERTUSSIS 5-2.5-18.5 LF-MCG/0.5 IM SUSP
0.5000 mL | Freq: Once | INTRAMUSCULAR | Status: AC
  Administered 2012-07-27: 0.5 mL via INTRAMUSCULAR
  Filled 2012-07-27: qty 0.5

## 2012-07-27 MED ORDER — HYDROCODONE-ACETAMINOPHEN 5-325 MG PO TABS
1.0000 | ORAL_TABLET | Freq: Once | ORAL | Status: AC
  Administered 2012-07-27: 1 via ORAL
  Filled 2012-07-27: qty 1

## 2012-07-27 NOTE — ED Notes (Signed)
Patient transported to X-ray 

## 2012-07-27 NOTE — Progress Notes (Signed)
Orthopedic Tech Progress Note Patient Details:  [name] 10-Jan-1961 [id number] Post op shoe applied to Left LE to relieve pressure off of toe. Application tolerated well. Care instructions given. Ortho Devices Type of Ortho Device: Postop shoe/boot Ortho Device/Splint Location: Left Ortho Device/Splint Interventions: Application   Asia R Thompson 07/27/2012, 1:51 PM

## 2012-07-27 NOTE — ED Notes (Signed)
Pt returned from xray

## 2012-07-27 NOTE — ED Provider Notes (Signed)
History  This chart was scribed for Loren Racer, MD by Shari Heritage, ED Scribe. The patient was seen in room CD01C/CD01C. Patient's care was started at 1117.  CSN: [id number]  Arrival date & time 07/27/12  1112   First MD Initiated Contact with Patient 07/27/12 1117      Chief Complaint  Patient presents with  . Fall  . Toe Injury     Patient is a 52 y.o. female presenting with fall. The history is provided by the patient. No language interpreter was used.  Fall The accident occurred 1 to 2 hours ago. The fall occurred while walking. She fell from a height of 3 to 5 ft. Point of impact: left foot. She was ambulatory at the scene. There was no entrapment after the fall. Pertinent negatives include no headaches. She has tried nothing for the symptoms.    HPI Comments: [name] is a 52 y.o. female who presents to the Emergency Department complaining of laceration to left 3rd and 4th toes resulting from a fall that occurred 1-2 hours ago at home. Patient states that she stumbled over her feet and fell injuring her left foot. She denies any head injury, LOC, neck pain or back pain. She is currently on Coumadin but states that she is not sure of recent INR levels. Patient has a chronic trach. Medical history includes diabetes, asthma, OSA, neuropathy, CHF and hyperlipidemia.   Past Medical History  Diagnosis Date  . OSA (obstructive sleep apnea)   . Morbid obesity   . Depressive disorder, not elsewhere classified   . Diabetes mellitus   . Asthma   . Dyslipidemia   . Clostridium difficile enterocolitis 2012  . Splenomegaly 06/01/2011  . Thrombocytopenia 06/01/2011  . Hepatosplenomegaly   . Gout   . GERD (gastroesophageal reflux disease)   . Osteoarthritis   . Polyneuropathy   . Urinary incontinence   . Amenorrhea   . CHF (congestive heart failure)   . Hyperlipidemia   . Leg swelling   . Nausea   . Weakness   . Palpitations   . PONV (postoperative nausea and vomiting)    . Anginal pain     no chest pain in years  . Dysrhythmia     heart palpations  . Anxiety   . Pneumonia   . Shortness of breath   . Chronic kidney disease     overactive bladder  . Peripheral neuropathy     Past Surgical History  Procedure Date  . Cholecystectomy   . Tracheostomy   . Endometrial ablation   . Skin graft   . Finger surgery     ring finger on right hand  . Esophageal dilation 2003  . Breast biopsy     needle core, left  . Dilation and curettage of uterus     times 2  . Tracheal dilitation 01/24/2012    Procedure: TRACHEAL DILITATION;  Surgeon: [name], MD;  Location: [facility] OR;  Service: ENT;  Laterality: N/A;  Trache change with possible dilation, from size 6 to size 7 uncuffed    Family History  Problem Relation Age of Onset  . Diabetes Father   . Hypertension Father   . Dementia Father   . Pneumonia Father   . Heart disease Mother   . Clotting disorder Mother   . Hypertension Mother   . Heart attack Mother   . Multiple myeloma Paternal Grandmother   . Cancer Paternal Grandmother     multiple myoloma  .  Heart disease Paternal Grandfather   . Kidney disease Maternal Grandmother   . Hypertension Sister   . Cancer Paternal Aunt     breast  . Cancer Cousin     breast - all three    History  Substance Use Topics  . Smoking status: Never Smoker   . Smokeless tobacco: Never Used  . Alcohol Use: No     Comment: occasional - once or twice a year    OB History    Grav Para Term Preterm Abortions TAB SAB Ect Mult Living   0               Review of Systems  HENT: Negative for neck pain.   Musculoskeletal: Negative for back pain.  Skin: Positive for wound.  Neurological: Negative for syncope and headaches.  All other systems reviewed and are negative.    Allergies  Molds & smuts; Nitroglycerin er; Accolate; Allopurinol; Amoxicillin-pot clavulanate; Ciprofloxacin; Codeine; Fexofenadine; Morphine; Sulfa drugs cross reactors; and Sulfonamide  derivatives  Home Medications   Current Outpatient Rx  Name  Route  Sig  Dispense  Refill  . ACETAMINOPHEN 500 MG PO TABS   Oral   Take 1,000 mg by mouth every 6 (six) hours as needed. For pain         . ALBUTEROL SULFATE (2.5 MG/3ML) 0.083% IN NEBU   Nebulization   Take 2.5 mg by nebulization every 4 (four) hours as needed. For shortness of breath         . ALPRAZOLAM 0.5 MG PO TABS   Oral   Take 0.5 mg by mouth 2 (two) times daily as needed. For anxiety         . ATORVASTATIN CALCIUM 10 MG PO TABS   Oral   Take 10 mg by mouth at bedtime.          [name] VITAMIN B COMPLEX PO   Oral   Take 1 tablet by mouth daily.          . BUDESONIDE 0.25 MG/2ML IN SUSP   Nebulization   Take 0.25 mg by nebulization 3 (three) times daily as needed. For shortness of breath.         . BUMETANIDE 1 MG PO TABS   Oral   Take 2 mg by mouth 2 (two) times daily.          . BUSPIRONE HCL 5 MG PO TABS   Oral   Take 7.5 mg by mouth 2 (two) times daily. \         . CHOLESTYRAMINE 4 G PO PACK   Oral   Take 1 packet by mouth as needed. For loose stools, IBS.         [name] COLCHICINE 0.6 MG PO TABS   Oral   Take 0.6 mg by mouth daily.         [name] DICYCLOMINE HCL 20 MG PO TABS   Oral   Take 20 mg by mouth 2 (two) times daily.           . DULOXETINE HCL 30 MG PO CPEP   Oral   Take 30 mg by mouth 2 (two) times daily.           . ERGOCALCIFEROL 50000 UNITS PO CAPS   Oral   Take 50,000 Units by mouth once a week. Take on Fridays         . FEBUXOSTAT 80 MG PO TABS   Oral   Take 80 mg  by mouth daily.          . FESOTERODINE FUMARATE ER 4 MG PO TB24   Oral   Take 4 mg by mouth daily.           . OMEGA-3 FATTY ACIDS 1000 MG PO CAPS   Oral   Take 1 g by mouth 2 (two) times daily.          [name] GABAPENTIN 100 MG PO CAPS   Oral   Take 100 mg by mouth 4 (four) times daily.          . GUAIFENESIN ER 600 MG PO TB12   Oral   Take 1,200 mg by mouth 2 (two) times daily.            [name] HYDROCODONE-ACETAMINOPHEN 5-500 MG PO TABS   Oral   Take 1 tablet by mouth every 6 (six) hours as needed. For pain         . INSULIN ASPART 100 UNIT/ML [place] SOLN   Subcutaneous   Inject 16 Units into the skin 3 (three) times daily before meals.          . INSULIN GLARGINE 100 UNIT/ML [place] SOLN   Subcutaneous   Inject 70 Units into the skin 2 (two) times daily.          . IPRATROPIUM BROMIDE 0.02 % IN SOLN   Nebulization   Take 2.5 mLs (500 mcg total) by nebulization 4 (four) times daily.   75 mL   12   . LIRAGLUTIDE 18 MG/3ML  SOLN   Subcutaneous   Inject 1.8 mLs into the skin every morning.         [name] LORATADINE 10 MG PO TABS   Oral   Take 10 mg by mouth daily as needed. For allergies         . MECLIZINE HCL 25 MG PO TABS   Oral   Take 25 mg by mouth daily.         [name] METOLAZONE 5 MG PO TABS   Oral   Take 5 mg by mouth 3 (three) times a week.          . MULTIVITAMINS PO CAPS   Oral   Take 1 capsule by mouth daily.           . NEBIVOLOL HCL 10 MG PO TABS   Oral   Take 10 mg by mouth daily.           [name] OMEPRAZOLE 40 MG PO CPDR   Oral   Take 40 mg by mouth 2 (two) times daily.          [name] POLYETHYLENE GLYCOL 3350 PO PACK   Oral   Take 17 g by mouth daily. For constipation.         [name] POTASSIUM CHLORIDE CRYS ER 20 MEQ PO TBCR   Oral   Take 20 mEq by mouth 3 (three) times daily.          [name] PRIMIDONE 50 MG PO TABS   Oral   Take 25-75 mg by mouth 2 (two) times daily. Take 25 MG in the morning and 75 MG at bedtime.         [name] PROBIOTIC PO CAPS   Oral   Take 1 capsule by mouth daily.          [name] PROMETHAZINE HCL 25 MG PO TABS   Oral   Take 25 mg by mouth every 6 (six) hours as  needed. For nausea/vomiting         . SENNOSIDES-DOCUSATE SODIUM 8.6-50 MG PO TABS   Oral   Take 1 tablet by mouth daily as needed. For constipation         . TOPIRAMATE 25 MG PO TABS   Oral   Take 1 tablet (25 mg total) by mouth daily.          [name] VALSARTAN 160 MG PO TABS   Oral   Take 160 mg by mouth daily.           . WARFARIN SODIUM 7.5 MG PO TABS   Oral   Take 5-7.5 mg by mouth every evening. As directed. Take the 5mg  mon, tues, thurs, and fridays, and 7.5mg  rest of days         . CEPHALEXIN 500 MG PO CAPS   Oral   Take 1 capsule (500 mg total) by mouth 2 (two) times daily.   20 capsule   0   . HYDROCODONE-ACETAMINOPHEN 5-325 MG PO TABS   Oral   Take 1 tablet by mouth every 4 (four) hours as needed for pain.   10 tablet   0     Triage Vitals: BP 123/76  Pulse 73  Temp 97.9 F (36.6 C) (Oral)  Resp 24  SpO2 99%  Physical Exam  Constitutional: She is oriented to person, place, and time. She appears well-developed and well-nourished. No distress.  HENT:  Head: Normocephalic and atraumatic.       Chronic trach.   Eyes: Conjunctivae normal and EOM are normal. Pupils are equal, round, and reactive to light.  Neck: Neck supple.  Cardiovascular: Normal rate and regular rhythm.   Pulmonary/Chest: Breath sounds normal. No respiratory distress. She has no wheezes. She has no rales.  Abdominal: She exhibits no distension.  Musculoskeletal:       Moving all extremities without difficulty.  Neurological: She is alert and oriented to person, place, and time.  Skin: Skin is warm and dry.       1 cm laceration to the 3rd and 4th digit of the left foot on the plantar surface and the level of the MCP joint. No other injuries.   Psychiatric: She has a normal mood and affect. Her behavior is normal.    ED Course  Procedures (including critical care time) DIAGNOSTIC STUDIES: Oxygen Saturation is 99% on room air, normal by my interpretation.    COORDINATION OF CARE: 11:25 AM- Patient informed of current plan for treatment and evaluation and agrees with plan at this time.   1:00 PM- Performed laceration repair with dermabond. Multiple coats of dermabond over lac. No bleeding. Gauze placed. Will place patient  in an orthopedic shoe. Instructed to follow up with PCP or ED in 2 days for wound check. Will start on prophylactic PO keflex. Instructed to return for fevers, chills, increased redness ot the area or warmth.   LACERATION REPAIR Performed by: Loren Racer, MD at 1:00 PM Consent: Verbal consent obtained. Risks and benefits: risks, benefits and alternatives were discussed Patient identity confirmed: provided demographic data Time out performed prior to procedure Prepped and Draped in normal sterile fashion Wound explored Laceration Location: 3rd and 4th digit of the left foot on the plantar surface Laceration Length: 1 cm No Foreign Bodies seen or palpated Anesthesia: No anesthesia required. Irrigation method: syringe Amount of cleaning: standard Skin closure: Dermabond Patient tolerance: Patient tolerated the procedure well with no immediate complications.    Dg Foot Complete  Left  07/27/2012  *RADIOLOGY REPORT*  Clinical Data: Post fall, now with left foot pain, primarily within the third to the fifth metatarsals.  LEFT FOOT - COMPLETE 3+ VIEW  Comparison: None.  Findings:  The AP radiograph is degraded secondary to obliquity.  No definite fracture or dislocation.  There is a mild hallux valgus deformity without associated degenerative change.  Mild degenerative change of several mid foot articulations.  No definite erosions.  Small plantar calcaneal spur.  Minimal enthesopathic change of Achilles tendon insertion site.  Dermal calcifications about the lower leg.  IMPRESSION: 1.  No definite fracture or dislocation. 2.  Mild hallux valgus deformity without associated degenerative change. 3.  Small plantar calcaneal spur.   Original Report Authenticated By: Tacey Ruiz, MD      1. Laceration of fourth toe of left foot   2. Laceration of third toe of left foot       MDM  I personally performed the services described in this documentation, which was scribed in my presence. The  recorded information has been reviewed and is accurate.    Loren Racer, MD 07/27/12 1316

## 2012-07-27 NOTE — ED Notes (Signed)
Pt arrived from home via EMS. Pt fell this morning at home and has a lac to left middle toe. Pt is on Coumadin and ASA. Stated that she stumbled over her own feet. Denies hitting head or LOC. Pt also has trach that has been in place x 13years.

## 2012-08-01 ENCOUNTER — Telehealth: Payer: Self-pay | Admitting: Oncology

## 2012-08-01 NOTE — Telephone Encounter (Signed)
pt called to r/s missed appt....Done °

## 2012-08-06 ENCOUNTER — Other Ambulatory Visit: Payer: Medicaid Other

## 2012-08-08 ENCOUNTER — Encounter (HOSPITAL_COMMUNITY): Payer: Self-pay

## 2012-08-08 ENCOUNTER — Emergency Department (HOSPITAL_COMMUNITY): Payer: Medicare Other

## 2012-08-08 ENCOUNTER — Inpatient Hospital Stay (HOSPITAL_COMMUNITY)
Admission: EM | Admit: 2012-08-08 | Discharge: 2012-08-11 | DRG: 872 | Disposition: A | Discharge status: Home / Self Care | Payer: Medicare Other | Attending: Internal Medicine | Admitting: Internal Medicine

## 2012-08-08 DIAGNOSIS — E872 Acidosis, unspecified: Secondary | ICD-10-CM

## 2012-08-08 DIAGNOSIS — J309 Allergic rhinitis, unspecified: Secondary | ICD-10-CM

## 2012-08-08 DIAGNOSIS — D696 Thrombocytopenia, unspecified: Secondary | ICD-10-CM | POA: Diagnosis present

## 2012-08-08 DIAGNOSIS — I10 Essential (primary) hypertension: Secondary | ICD-10-CM

## 2012-08-08 DIAGNOSIS — G609 Hereditary and idiopathic neuropathy, unspecified: Secondary | ICD-10-CM | POA: Diagnosis present

## 2012-08-08 DIAGNOSIS — K219 Gastro-esophageal reflux disease without esophagitis: Secondary | ICD-10-CM

## 2012-08-08 DIAGNOSIS — G4733 Obstructive sleep apnea (adult) (pediatric): Secondary | ICD-10-CM

## 2012-08-08 DIAGNOSIS — E678 Other specified hyperalimentation: Secondary | ICD-10-CM | POA: Diagnosis present

## 2012-08-08 DIAGNOSIS — Z930 Tracheostomy status: Secondary | ICD-10-CM

## 2012-08-08 DIAGNOSIS — E662 Morbid (severe) obesity with alveolar hypoventilation: Secondary | ICD-10-CM | POA: Diagnosis present

## 2012-08-08 DIAGNOSIS — F411 Generalized anxiety disorder: Secondary | ICD-10-CM | POA: Diagnosis present

## 2012-08-08 DIAGNOSIS — I509 Heart failure, unspecified: Secondary | ICD-10-CM | POA: Diagnosis present

## 2012-08-08 DIAGNOSIS — R7989 Other specified abnormal findings of blood chemistry: Secondary | ICD-10-CM

## 2012-08-08 DIAGNOSIS — E669 Obesity, unspecified: Secondary | ICD-10-CM

## 2012-08-08 DIAGNOSIS — Z6841 Body Mass Index (BMI) 40.0 and over, adult: Secondary | ICD-10-CM

## 2012-08-08 DIAGNOSIS — M109 Gout, unspecified: Secondary | ICD-10-CM | POA: Diagnosis present

## 2012-08-08 DIAGNOSIS — F3289 Other specified depressive episodes: Secondary | ICD-10-CM | POA: Diagnosis present

## 2012-08-08 DIAGNOSIS — E876 Hypokalemia: Secondary | ICD-10-CM

## 2012-08-08 DIAGNOSIS — N10 Acute tubulo-interstitial nephritis: Secondary | ICD-10-CM | POA: Diagnosis present

## 2012-08-08 DIAGNOSIS — L0390 Cellulitis, unspecified: Secondary | ICD-10-CM

## 2012-08-08 DIAGNOSIS — M1990 Unspecified osteoarthritis, unspecified site: Secondary | ICD-10-CM | POA: Diagnosis present

## 2012-08-08 DIAGNOSIS — Z931 Gastrostomy status: Secondary | ICD-10-CM

## 2012-08-08 DIAGNOSIS — E1169 Type 2 diabetes mellitus with other specified complication: Secondary | ICD-10-CM | POA: Diagnosis present

## 2012-08-08 DIAGNOSIS — A419 Sepsis, unspecified organism: Principal | ICD-10-CM | POA: Diagnosis present

## 2012-08-08 DIAGNOSIS — R6510 Systemic inflammatory response syndrome (SIRS) of non-infectious origin without acute organ dysfunction: Secondary | ICD-10-CM

## 2012-08-08 DIAGNOSIS — F329 Major depressive disorder, single episode, unspecified: Secondary | ICD-10-CM | POA: Diagnosis present

## 2012-08-08 DIAGNOSIS — E785 Hyperlipidemia, unspecified: Secondary | ICD-10-CM | POA: Diagnosis present

## 2012-08-08 DIAGNOSIS — D649 Anemia, unspecified: Secondary | ICD-10-CM | POA: Diagnosis present

## 2012-08-08 DIAGNOSIS — L03039 Cellulitis of unspecified toe: Secondary | ICD-10-CM | POA: Diagnosis present

## 2012-08-08 DIAGNOSIS — N289 Disorder of kidney and ureter, unspecified: Secondary | ICD-10-CM | POA: Diagnosis not present

## 2012-08-08 DIAGNOSIS — D72829 Elevated white blood cell count, unspecified: Secondary | ICD-10-CM

## 2012-08-08 DIAGNOSIS — J9610 Chronic respiratory failure, unspecified whether with hypoxia or hypercapnia: Secondary | ICD-10-CM | POA: Diagnosis present

## 2012-08-08 DIAGNOSIS — Z79899 Other long term (current) drug therapy: Secondary | ICD-10-CM

## 2012-08-08 DIAGNOSIS — N12 Tubulo-interstitial nephritis, not specified as acute or chronic: Secondary | ICD-10-CM | POA: Diagnosis present

## 2012-08-08 DIAGNOSIS — E119 Type 2 diabetes mellitus without complications: Secondary | ICD-10-CM

## 2012-08-08 DIAGNOSIS — R32 Unspecified urinary incontinence: Secondary | ICD-10-CM | POA: Diagnosis present

## 2012-08-08 DIAGNOSIS — Z7901 Long term (current) use of anticoagulants: Secondary | ICD-10-CM

## 2012-08-08 DIAGNOSIS — J45909 Unspecified asthma, uncomplicated: Secondary | ICD-10-CM | POA: Diagnosis present

## 2012-08-08 DIAGNOSIS — L03119 Cellulitis of unspecified part of limb: Secondary | ICD-10-CM | POA: Diagnosis present

## 2012-08-08 DIAGNOSIS — M793 Panniculitis, unspecified: Secondary | ICD-10-CM | POA: Diagnosis present

## 2012-08-08 DIAGNOSIS — R161 Splenomegaly, not elsewhere classified: Secondary | ICD-10-CM | POA: Diagnosis present

## 2012-08-08 DIAGNOSIS — E860 Dehydration: Secondary | ICD-10-CM | POA: Diagnosis not present

## 2012-08-08 DIAGNOSIS — L02619 Cutaneous abscess of unspecified foot: Secondary | ICD-10-CM | POA: Diagnosis present

## 2012-08-08 DIAGNOSIS — N189 Chronic kidney disease, unspecified: Secondary | ICD-10-CM | POA: Diagnosis present

## 2012-08-08 DIAGNOSIS — Z794 Long term (current) use of insulin: Secondary | ICD-10-CM

## 2012-08-08 DIAGNOSIS — L02419 Cutaneous abscess of limb, unspecified: Secondary | ICD-10-CM | POA: Diagnosis present

## 2012-08-08 DIAGNOSIS — N3941 Urge incontinence: Secondary | ICD-10-CM | POA: Diagnosis present

## 2012-08-08 LAB — CBC
HCT: 36 % (ref 36.0–46.0)
Hemoglobin: 12.5 g/dL (ref 12.0–15.0)
MCH: 31.8 pg (ref 26.0–34.0)
MCHC: 34.7 g/dL (ref 30.0–36.0)
MCV: 91.6 fL (ref 78.0–100.0)
Platelets: 113 10*3/uL — ABNORMAL LOW (ref 150–400)
RBC: 3.93 MIL/uL (ref 3.87–5.11)
RDW: 14.3 % (ref 11.5–15.5)
WBC: 15.2 10*3/uL — ABNORMAL HIGH (ref 4.0–10.5)

## 2012-08-08 LAB — BASIC METABOLIC PANEL
BUN: 20 mg/dL (ref 6–23)
CO2: 29 mEq/L (ref 19–32)
Calcium: 8.6 mg/dL (ref 8.4–10.5)
Chloride: 92 mEq/L — ABNORMAL LOW (ref 96–112)
Creatinine, Ser: 0.85 mg/dL (ref 0.50–1.10)
GFR calc Af Amer: >90 mL/min (ref >90)
GFR calc non Af Amer: 79 mL/min — ABNORMAL LOW (ref >90)
Glucose, Bld: 298 mg/dL — ABNORMAL HIGH (ref 70–99)
Potassium: 2.9 mEq/L — ABNORMAL LOW (ref 3.5–5.1)
Sodium: 137 mEq/L (ref 135–145)

## 2012-08-08 LAB — URINALYSIS, ROUTINE W REFLEX MICROSCOPIC
Glucose, UA: NEGATIVE mg/dL
Hgb urine dipstick: NEGATIVE
Ketones, ur: 15 mg/dL — AB
Leukocytes, UA: NEGATIVE
Nitrite: NEGATIVE
Protein, ur: NEGATIVE mg/dL
Specific Gravity, Urine: 1.024 (ref 1.005–1.030)
Urobilinogen, UA: 1 mg/dL (ref 0.0–1.0)
pH: 5 (ref 5.0–8.0)

## 2012-08-08 LAB — GLUCOSE, CAPILLARY

## 2012-08-08 LAB — LACTIC ACID, PLASMA: Lactic Acid, Venous: 4.2 mmol/L — ABNORMAL HIGH (ref 0.5–2.2)

## 2012-08-08 LAB — PROCALCITONIN: Procalcitonin: 0.62 ng/mL

## 2012-08-08 MED ORDER — SODIUM CHLORIDE 0.9 % IV SOLN
Freq: Once | INTRAVENOUS | Status: DC
  Administered 2012-08-08: 21:00:00 via INTRAVENOUS

## 2012-08-08 MED ORDER — POTASSIUM CHLORIDE CRYS ER 20 MEQ PO TBCR
20.0000 meq | EXTENDED_RELEASE_TABLET | Freq: Three times a day (TID) | ORAL | Status: DC
  Administered 2012-08-08 – 2012-08-11 (×9): 20 meq via ORAL
  Filled 2012-08-08 (×11): qty 1

## 2012-08-08 MED ORDER — ACETAMINOPHEN 650 MG RE SUPP: 650.0000 mg | Freq: Four times a day (QID) | RECTAL | Status: DC | PRN

## 2012-08-08 MED ORDER — GABAPENTIN 100 MG PO CAPS
100.0000 mg | ORAL_CAPSULE | Freq: Four times a day (QID) | ORAL | Status: DC
  Administered 2012-08-08 – 2012-08-11 (×12): 100 mg via ORAL
  Filled 2012-08-08 (×13): qty 1

## 2012-08-08 MED ORDER — ATORVASTATIN CALCIUM 10 MG PO TABS
10.0000 mg | ORAL_TABLET | Freq: Every day | ORAL | Status: DC
  Administered 2012-08-08 – 2012-08-10 (×3): 10 mg via ORAL
  Filled 2012-08-08 (×4): qty 1

## 2012-08-08 MED ORDER — HYDROMORPHONE HCL PF 1 MG/ML IJ SOLN
1.0000 mg | Freq: Four times a day (QID) | INTRAMUSCULAR | Status: DC | PRN
  Administered 2012-08-08: 1 mg via INTRAVENOUS
  Filled 2012-08-08: qty 1

## 2012-08-08 MED ORDER — ALPRAZOLAM 0.25 MG PO TABS
0.5000 mg | ORAL_TABLET | Freq: Two times a day (BID) | ORAL | Status: DC | PRN
  Administered 2012-08-11: 0.5 mg via ORAL
  Filled 2012-08-08: qty 2

## 2012-08-08 MED ORDER — TOPIRAMATE 25 MG PO TABS
25.0000 mg | ORAL_TABLET | Freq: Every day | ORAL | Status: DC
  Administered 2012-08-09 – 2012-08-11 (×3): 25 mg via ORAL
  Filled 2012-08-08 (×3): qty 1

## 2012-08-08 MED ORDER — PRIMIDONE 50 MG PO TABS: 25.0000 mg | ORAL_TABLET | Freq: Two times a day (BID) | ORAL | Status: DC

## 2012-08-08 MED ORDER — POTASSIUM CHLORIDE 10 MEQ/100ML IV SOLN
10.0000 meq | Freq: Once | INTRAVENOUS | Status: AC
  Administered 2012-08-08: 10 meq via INTRAVENOUS
  Filled 2012-08-08: qty 100

## 2012-08-08 MED ORDER — ACETAMINOPHEN 325 MG PO TABS
650.0000 mg | ORAL_TABLET | Freq: Once | ORAL | Status: AC
  Administered 2012-08-08: 650 mg via ORAL
  Filled 2012-08-08: qty 2

## 2012-08-08 MED ORDER — SODIUM CHLORIDE 0.9 % IV BOLUS (SEPSIS)
1000.0000 mL | Freq: Once | INTRAVENOUS | Status: AC
  Administered 2012-08-08: 1000 mL via INTRAVENOUS

## 2012-08-08 MED ORDER — FEBUXOSTAT 40 MG PO TABS
80.0000 mg | ORAL_TABLET | Freq: Every day | ORAL | Status: DC
  Administered 2012-08-08 – 2012-08-10 (×3): 80 mg via ORAL
  Filled 2012-08-08 (×4): qty 2

## 2012-08-08 MED ORDER — GUAIFENESIN ER 600 MG PO TB12
600.0000 mg | ORAL_TABLET | Freq: Two times a day (BID) | ORAL | Status: DC
  Administered 2012-08-08 – 2012-08-11 (×6): 600 mg via ORAL
  Filled 2012-08-08 (×7): qty 1

## 2012-08-08 MED ORDER — POLYETHYLENE GLYCOL 3350 17 G PO PACK
17.0000 g | PACK | Freq: Every day | ORAL | Status: DC | PRN
  Filled 2012-08-08: qty 1

## 2012-08-08 MED ORDER — WARFARIN SODIUM 5 MG PO TABS
5.0000 mg | ORAL_TABLET | Freq: Once | ORAL | Status: AC
  Administered 2012-08-08: 5 mg via ORAL
  Filled 2012-08-08: qty 1

## 2012-08-08 MED ORDER — PRIMIDONE 50 MG PO TABS
75.0000 mg | ORAL_TABLET | Freq: Every day | ORAL | Status: DC
  Administered 2012-08-08 – 2012-08-10 (×3): 75 mg via ORAL
  Filled 2012-08-08 (×4): qty 1.5

## 2012-08-08 MED ORDER — FESOTERODINE FUMARATE ER 4 MG PO TB24
4.0000 mg | ORAL_TABLET | Freq: Every day | ORAL | Status: DC
  Administered 2012-08-09 – 2012-08-11 (×3): 4 mg via ORAL
  Filled 2012-08-08 (×3): qty 1

## 2012-08-08 MED ORDER — ONDANSETRON HCL 4 MG/2ML IJ SOLN
4.0000 mg | Freq: Four times a day (QID) | INTRAMUSCULAR | Status: DC | PRN
  Filled 2012-08-08: qty 2

## 2012-08-08 MED ORDER — POTASSIUM CHLORIDE CRYS ER 20 MEQ PO TBCR
40.0000 meq | EXTENDED_RELEASE_TABLET | Freq: Once | ORAL | Status: AC
  Administered 2012-08-08: 40 meq via ORAL
  Filled 2012-08-08: qty 2

## 2012-08-08 MED ORDER — ADULT MULTIVITAMIN W/MINERALS CH
1.0000 | ORAL_TABLET | Freq: Every day | ORAL | Status: DC
  Administered 2012-08-09 – 2012-08-11 (×3): 1 via ORAL
  Filled 2012-08-08 (×3): qty 1

## 2012-08-08 MED ORDER — SODIUM CHLORIDE 0.9 % IV BOLUS (SEPSIS)
500.0000 mL | Freq: Once | INTRAVENOUS | Status: AC
  Administered 2012-08-08: 500 mL via INTRAVENOUS

## 2012-08-08 MED ORDER — ALBUTEROL SULFATE (5 MG/ML) 0.5% IN NEBU: 2.5000 mg | INHALATION_SOLUTION | RESPIRATORY_TRACT | Status: DC | PRN

## 2012-08-08 MED ORDER — FENTANYL CITRATE 0.05 MG/ML IJ SOLN
75.0000 ug | Freq: Once | INTRAMUSCULAR | Status: AC
  Administered 2012-08-08: 75 ug via INTRAVENOUS
  Filled 2012-08-08: qty 2

## 2012-08-08 MED ORDER — LORATADINE 10 MG PO TABS
10.0000 mg | ORAL_TABLET | Freq: Every day | ORAL | Status: DC | PRN
  Filled 2012-08-08: qty 1

## 2012-08-08 MED ORDER — PIPERACILLIN-TAZOBACTAM 3.375 G IVPB
3.3750 g | Freq: Three times a day (TID) | INTRAVENOUS | Status: DC
  Administered 2012-08-08 – 2012-08-10 (×5): 3.375 g via INTRAVENOUS
  Filled 2012-08-08 (×8): qty 50

## 2012-08-08 MED ORDER — SODIUM CHLORIDE 0.9 % IV SOLN: INTRAVENOUS | Status: DC

## 2012-08-08 MED ORDER — DICYCLOMINE HCL 20 MG PO TABS
20.0000 mg | ORAL_TABLET | Freq: Two times a day (BID) | ORAL | Status: DC
  Administered 2012-08-08 – 2012-08-11 (×6): 20 mg via ORAL
  Filled 2012-08-08 (×7): qty 1

## 2012-08-08 MED ORDER — DULOXETINE HCL 30 MG PO CPEP
30.0000 mg | ORAL_CAPSULE | Freq: Two times a day (BID) | ORAL | Status: DC
  Administered 2012-08-08 – 2012-08-11 (×6): 30 mg via ORAL
  Filled 2012-08-08 (×7): qty 1

## 2012-08-08 MED ORDER — ACETAMINOPHEN 325 MG PO TABS: 650.0000 mg | ORAL_TABLET | Freq: Four times a day (QID) | ORAL | Status: DC | PRN

## 2012-08-08 MED ORDER — ONDANSETRON HCL 4 MG PO TABS: 4.0000 mg | ORAL_TABLET | Freq: Four times a day (QID) | ORAL | Status: DC | PRN

## 2012-08-08 MED ORDER — BUMETANIDE 2 MG PO TABS
2.0000 mg | ORAL_TABLET | Freq: Two times a day (BID) | ORAL | Status: DC
  Administered 2012-08-08 – 2012-08-11 (×6): 2 mg via ORAL
  Filled 2012-08-08 (×8): qty 1

## 2012-08-08 MED ORDER — VANCOMYCIN HCL 10 G IV SOLR
2000.0000 mg | Freq: Once | INTRAVENOUS | Status: AC
  Administered 2012-08-08: 2000 mg via INTRAVENOUS
  Filled 2012-08-08: qty 2000

## 2012-08-08 MED ORDER — ONDANSETRON HCL 4 MG/2ML IJ SOLN
4.0000 mg | Freq: Once | INTRAMUSCULAR | Status: AC
  Administered 2012-08-08: 4 mg via INTRAVENOUS
  Filled 2012-08-08: qty 2

## 2012-08-08 MED ORDER — RISAQUAD PO CAPS
1.0000 | ORAL_CAPSULE | Freq: Every day | ORAL | Status: DC
  Administered 2012-08-09 – 2012-08-11 (×3): 1 via ORAL
  Filled 2012-08-08 (×3): qty 1

## 2012-08-08 MED ORDER — BUSPIRONE HCL 15 MG PO TABS
7.5000 mg | ORAL_TABLET | Freq: Two times a day (BID) | ORAL | Status: DC
Start: 2012-08-08 — End: 2012-08-11
  Administered 2012-08-08 – 2012-08-11 (×6): 7.5 mg via ORAL
  Filled 2012-08-08 (×7): qty 1

## 2012-08-08 MED ORDER — PRIMIDONE 50 MG PO TABS
25.0000 mg | ORAL_TABLET | Freq: Every day | ORAL | Status: DC
  Administered 2012-08-09: 09:00:00 via ORAL
  Administered 2012-08-10 – 2012-08-11 (×2): 25 mg via ORAL
  Filled 2012-08-08 (×3): qty 0.5

## 2012-08-08 MED ORDER — IRBESARTAN 150 MG PO TABS
150.0000 mg | ORAL_TABLET | Freq: Every day | ORAL | Status: DC
  Administered 2012-08-09 – 2012-08-11 (×3): 150 mg via ORAL
  Filled 2012-08-08 (×3): qty 1

## 2012-08-08 MED ORDER — INSULIN ASPART 100 UNIT/ML ~~LOC~~ SOLN
0.0000 [IU] | Freq: Three times a day (TID) | SUBCUTANEOUS | Status: DC
  Administered 2012-08-09: 11 [IU] via SUBCUTANEOUS
  Administered 2012-08-09: 7 [IU] via SUBCUTANEOUS
  Administered 2012-08-09 – 2012-08-11 (×6): 11 [IU] via SUBCUTANEOUS
  Administered 2012-08-11: 7 [IU] via SUBCUTANEOUS

## 2012-08-08 MED ORDER — BUDESONIDE 0.25 MG/2ML IN SUSP
0.2500 mg | Freq: Three times a day (TID) | RESPIRATORY_TRACT | Status: DC | PRN
  Filled 2012-08-08: qty 2

## 2012-08-08 MED ORDER — NEBIVOLOL HCL 10 MG PO TABS
10.0000 mg | ORAL_TABLET | Freq: Every day | ORAL | Status: DC
  Administered 2012-08-09 – 2012-08-11 (×3): 10 mg via ORAL
  Filled 2012-08-08 (×3): qty 1

## 2012-08-08 MED ORDER — PIPERACILLIN-TAZOBACTAM 3.375 G IVPB 30 MIN: 3.3750 g | Freq: Once | INTRAVENOUS | Status: DC

## 2012-08-08 MED ORDER — IOHEXOL 300 MG/ML  SOLN
100.0000 mL | Freq: Once | INTRAMUSCULAR | Status: AC | PRN
  Administered 2012-08-08: 100 mL via INTRAVENOUS

## 2012-08-08 MED ORDER — VANCOMYCIN HCL 10 G IV SOLR
1250.0000 mg | Freq: Two times a day (BID) | INTRAVENOUS | Status: DC
  Administered 2012-08-09 – 2012-08-10 (×3): 1250 mg via INTRAVENOUS
  Filled 2012-08-08 (×5): qty 1250

## 2012-08-08 MED ORDER — INSULIN GLARGINE 100 UNIT/ML ~~LOC~~ SOLN
90.0000 [IU] | Freq: Two times a day (BID) | SUBCUTANEOUS | Status: DC
  Administered 2012-08-08 – 2012-08-10 (×4): 90 [IU] via SUBCUTANEOUS

## 2012-08-08 MED ORDER — MULTIVITAMINS PO CAPS: 1.0000 | ORAL_CAPSULE | Freq: Every day | ORAL | Status: DC

## 2012-08-08 MED ORDER — PANTOPRAZOLE SODIUM 40 MG PO TBEC
40.0000 mg | DELAYED_RELEASE_TABLET | Freq: Two times a day (BID) | ORAL | Status: DC
  Administered 2012-08-08 – 2012-08-11 (×6): 40 mg via ORAL
  Filled 2012-08-08 (×6): qty 1

## 2012-08-08 MED ORDER — METOLAZONE 5 MG PO TABS
5.0000 mg | ORAL_TABLET | ORAL | Status: DC
  Administered 2012-08-11: 5 mg via ORAL
  Filled 2012-08-08: qty 1

## 2012-08-08 MED ORDER — IOHEXOL 300 MG/ML  SOLN
25.0000 mL | INTRAMUSCULAR | Status: AC
  Administered 2012-08-08: 25 mL via ORAL

## 2012-08-08 MED ORDER — WARFARIN - PHARMACIST DOSING INPATIENT: Freq: Every day | Status: DC

## 2012-08-08 MED ORDER — PROBIOTIC PO CAPS: 1.0000 | ORAL_CAPSULE | Freq: Every day | ORAL | Status: DC

## 2012-08-08 MED ORDER — IPRATROPIUM BROMIDE 0.02 % IN SOLN
500.0000 ug | Freq: Four times a day (QID) | RESPIRATORY_TRACT | Status: DC
  Administered 2012-08-09 – 2012-08-11 (×11): 500 ug via RESPIRATORY_TRACT
  Filled 2012-08-08 (×11): qty 2.5

## 2012-08-08 MED ORDER — HYDROCODONE-ACETAMINOPHEN 5-325 MG PO TABS
1.0000 | ORAL_TABLET | Freq: Four times a day (QID) | ORAL | Status: DC | PRN
  Administered 2012-08-09: 1 via ORAL
  Filled 2012-08-08: qty 1

## 2012-08-08 MED ORDER — COLCHICINE 0.6 MG PO TABS
0.6000 mg | ORAL_TABLET | Freq: Every day | ORAL | Status: DC
  Administered 2012-08-09 – 2012-08-11 (×3): 0.6 mg via ORAL
  Filled 2012-08-08 (×3): qty 1

## 2012-08-08 NOTE — ED Notes (Signed)
Spoke with IV nurse, she will be down to see pt.

## 2012-08-08 NOTE — ED Notes (Signed)
Pt. Reported lt lower back pain dsyuria, and fever.  Pt. Also having urgency.  Fever began this am.  Pt. Took Tylenol 650mg  2 hours ago.  Pt. Also has a trach for her [place] and sleep apnea.  Pt. Is very sob when she moved herself over to the bed.  She has a cough, but pt. Reports that it is chronic.  Pt. Is in NAD

## 2012-08-08 NOTE — ED Notes (Signed)
Report given to [place], rn.  Pt transported to floor via stretcher and monitor.

## 2012-08-08 NOTE — H&P (Signed)
[facility] History and Physical  [name] ZOX:[id number] DOB: 01/06/1961 DOA: 08/08/2012  Referring physician: Dr. Juleen China PCP: Astrid Divine, MD  Specialists: Dr. Craige Cotta (pulmonology)  Chief Complaint: fever, abdominal pain, urgency, dysuria  HPI: [name] is a 52 y.o. female with significant and complex pmh as mentioned below; came to ED complaining of about 2 days of abdominal/CVA tenderness, fever, urgency and dysuria. Patient also with some erythema affecting her pannus. She endorses some nausea but not vomiting. Denies CP, SOB, melena, hematemesis, hematochezia or any other acute complaints. In the ED found with SIRS (tachycardia, febrile, elevated WBC's); also with elevated lactic acid. Patient received IVF's in the ED, also had pan-cultures taken and started on empiric abx's. [facility] called to admit patient for further evaluation and treatment.  Review of Systems:  Negative except as mentioned on HPI.  Past Medical History  Diagnosis Date  . OSA (obstructive sleep apnea)   . Morbid obesity   . Depressive disorder, not elsewhere classified   . Diabetes mellitus   . Asthma   . Dyslipidemia   . Clostridium difficile enterocolitis 2012  . Splenomegaly 06/01/2011  . Thrombocytopenia 06/01/2011  . Hepatosplenomegaly   . Gout   . GERD (gastroesophageal reflux disease)   . Osteoarthritis   . Polyneuropathy   . Urinary incontinence   . Amenorrhea   . CHF (congestive heart failure)   . Hyperlipidemia   . Leg swelling   . Nausea   . Weakness   . Palpitations   . PONV (postoperative nausea and vomiting)   . Anginal pain     no chest pain in years  . Dysrhythmia     heart palpations  . Anxiety   . Pneumonia   . Shortness of breath   . Chronic kidney disease     overactive bladder  . Peripheral neuropathy    Past Surgical History  Procedure Laterality Date  . Cholecystectomy    . Tracheostomy    . Endometrial ablation    . Skin graft    . Finger  surgery      ring finger on right hand  . Esophageal dilation  2003  . Breast biopsy      needle core, left  . Dilation and curettage of uterus      times 2  . Tracheal dilitation  01/24/2012    Procedure: TRACHEAL DILITATION;  Surgeon: [name], MD;  Location: [facility] OR;  Service: ENT;  Laterality: N/A;  Trache change with possible dilation, from size 6 to size 7 uncuffed   Social History:  reports that she has never smoked. She has never used smokeless tobacco. She reports that she does not drink alcohol or use illicit drugs. Patient came from home; reports decrease mobility due to obesity.  Allergies  Allergen Reactions  . Molds & Smuts Shortness Of Breath and Rash  . Nitroglycerin Er Nausea And Vomiting  . Accolate (Zafirlukast) Other (See Comments)    Reaction unknown  . Allopurinol     GI  . Amoxicillin-Pot Clavulanate     unknown  . Ciprofloxacin     GI problems  . Codeine     Heart problems  . Fexofenadine     Hurts joints  . Morphine     unknown  . Sulfa Drugs Cross Reactors Nausea And Vomiting    nausea  . Sulfonamide Derivatives     nausea    Family History  Problem Relation Age of Onset  . Diabetes Father   .  Hypertension Father   . Dementia Father   . Pneumonia Father   . Heart disease Mother   . Clotting disorder Mother   . Hypertension Mother   . Heart attack Mother   . Multiple myeloma Paternal Grandmother   . Cancer Paternal Grandmother     multiple myoloma  . Heart disease Paternal Grandfather   . Kidney disease Maternal Grandmother   . Hypertension Sister   . Cancer Paternal Aunt     breast  . Cancer Cousin     breast - all three    Prior to Admission medications   Medication Sig Start Date End Date Taking? Authorizing Provider  acetaminophen (TYLENOL) 500 MG tablet Take 1,000 mg by mouth every 6 (six) hours as needed. For pain   Yes Historical Provider, MD  albuterol (PROVENTIL) (2.5 MG/3ML) 0.083% nebulizer solution Take 2.5 mg by  nebulization every 4 (four) hours as needed. For shortness of breath 05/28/11  Yes Coralyn Helling, MD  ALPRAZolam Prudy Feeler) 0.5 MG tablet Take 0.5 mg by mouth 2 (two) times daily as needed. For anxiety   Yes Historical Provider, MD  atorvastatin (LIPITOR) 10 MG tablet Take 10 mg by mouth at bedtime.    Yes Historical Provider, MD  B Complex Vitamins (VITAMIN B COMPLEX PO) Take 1 tablet by mouth daily.    Yes Historical Provider, MD  budesonide (PULMICORT) 0.25 MG/2ML nebulizer solution Take 0.25 mg by nebulization 3 (three) times daily as needed. For shortness of breath. 05/28/11  Yes Coralyn Helling, MD  bumetanide (BUMEX) 1 MG tablet Take 2 mg by mouth 2 (two) times daily.    Yes Historical Provider, MD  busPIRone (BUSPAR) 5 MG tablet Take 7.5 mg by mouth 2 (two) times daily. \   Yes Historical Provider, MD  cholestyramine Lanetta Inch) 4 G packet Take 1 packet by mouth as needed. For loose stools, IBS.   Yes Historical Provider, MD  clotrimazole (LOTRIMIN) 1 % cream Apply 1 application topically daily. To stomach   Yes Historical Provider, MD  colchicine 0.6 MG tablet Take 0.6 mg by mouth daily.   Yes Historical Provider, MD  dicyclomine (BENTYL) 20 MG tablet Take 20 mg by mouth 2 (two) times daily.     Yes Historical Provider, MD  DULoxetine (CYMBALTA) 30 MG capsule Take 30 mg by mouth 2 (two) times daily.     Yes Historical Provider, MD  ergocalciferol (VITAMIN D2) 50000 UNITS capsule Take 50,000 Units by mouth once a week. Take on Fridays   Yes Historical Provider, MD  Febuxostat (ULORIC) 80 MG TABS Take 80 mg by mouth at bedtime.    Yes Historical Provider, MD  fesoterodine (TOVIAZ) 4 MG TB24 Take 4 mg by mouth daily.     Yes Historical Provider, MD  fish oil-omega-3 fatty acids 1000 MG capsule Take 1 g by mouth 2 (two) times daily.    Yes Historical Provider, MD  gabapentin (NEURONTIN) 100 MG capsule Take 100 mg by mouth 4 (four) times daily.    Yes Historical Provider, MD  guaiFENesin (MUCINEX) 600 MG 12  hr tablet Take 600 mg by mouth 2 (two) times daily.    Yes Historical Provider, MD  HYDROcodone-acetaminophen (NORCO/VICODIN) 5-325 MG per tablet Take 1 tablet by mouth every 4 (four) hours as needed for pain. 07/27/12  Yes Loren Racer, MD  HYDROcodone-acetaminophen (VICODIN) 5-500 MG per tablet Take 1 tablet by mouth every 6 (six) hours as needed. For pain   Yes Historical Provider, MD  insulin  aspart (NOVOLOG) 100 UNIT/ML injection Inject 16 Units into the skin 3 (three) times daily before meals.    Yes Historical Provider, MD  insulin glargine (LANTUS) 100 UNIT/ML injection Inject 70-92 Units into the skin 2 (two) times daily. Sliding scale   Yes Historical Provider, MD  ipratropium (ATROVENT) 0.02 % nebulizer solution Take 2.5 mLs (500 mcg total) by nebulization 4 (four) times daily. 03/27/12  Yes Coralyn Helling, MD  Liraglutide (VICTOZA) 18 MG/3ML SOLN Inject 1.8 mLs into the skin every morning.   Yes Historical Provider, MD  loratadine (CLARITIN) 10 MG tablet Take 10 mg by mouth daily as needed. For allergies   Yes Historical Provider, MD  meclizine (ANTIVERT) 25 MG tablet Take 25 mg by mouth daily.   Yes Historical Provider, MD  metolazone (ZAROXOLYN) 5 MG tablet Take 5 mg by mouth 3 (three) times a week.    Yes Historical Provider, MD  Multiple Vitamin (MULTIVITAMIN) capsule Take 1 capsule by mouth daily.     Yes Historical Provider, MD  nebivolol (BYSTOLIC) 10 MG tablet Take 10 mg by mouth daily.     Yes Historical Provider, MD  omeprazole (PRILOSEC) 40 MG capsule Take 40 mg by mouth 2 (two) times daily.    Yes Historical Provider, MD  polyethylene glycol (MIRALAX / GLYCOLAX) packet Take 17 g by mouth daily as needed (for constipation). For constipation.   Yes Historical Provider, MD  potassium chloride SA (K-DUR,KLOR-CON) 20 MEQ tablet Take 20 mEq by mouth 3 (three) times daily.    Yes Historical Provider, MD  primidone (MYSOLINE) 50 MG tablet Take 25-75 mg by mouth 2 (two) times daily. Take  25 MG in the morning and 75 MG at bedtime.   Yes Historical Provider, MD  PROBIOTIC CAPS Take 1 capsule by mouth daily.    Yes Historical Provider, MD  promethazine (PHENERGAN) 25 MG tablet Take 25 mg by mouth every 6 (six) hours as needed. For nausea/vomiting   Yes Historical Provider, MD  senna-docusate (SENOKOT-S) 8.6-50 MG per tablet Take 1 tablet by mouth daily as needed. For constipation   Yes Historical Provider, MD  topiramate (TOPAMAX) 25 MG tablet Take 1 tablet (25 mg total) by mouth daily. 01/03/12 01/02/13 Yes Marinda Elk, MD  valsartan (DIOVAN) 160 MG tablet Take 160 mg by mouth daily.     Yes Historical Provider, MD  warfarin (COUMADIN) 5 MG tablet Take 5-7.5 mg by mouth daily. Take 1 tablet (5mg ) on Monday, Tuesday, Thursday, Friday.   Take 1.5 tablet (7.5mg ) on Sunday, Wednesday, and saturday   Yes Historical Provider, MD   Physical Exam: Filed Vitals:   08/08/12 1349  BP: 119/57  Pulse: 111  Temp: 100.5 F (38.1 C)  TempSrc: Oral  Resp: 24  SpO2: 96%     General:  Obese, NAD; complaining of right CVA tenderness  Eyes: PERRLA, EOMI, no icterus or nystagmus  ENT: mild dryness on her MM, no erythema or exudates inside her mouth; no drainage out of ears or nostrils  Neck: supple, short, no thyromegaly  Cardiovascular: no rubs or gallops, S1 and S2;  tachycardic  Respiratory: no wheezing, no crackles, good air movement  Abdomen: soft, obese; right side tenderness to deep palpation, erythematous, no masses; positive BS  Skin: erythema on right cva abdomen/ pannus; venous stasis changes on her legs bilaterally, no open wounds  Musculoskeletal: no joint erythema, FROM (except for pain with movement on her right knee, chronic)  Psychiatric: stable/appropriate mood  Neurologic: AAOX3, non  focal motor or sensory deficit; CN grossly intact.  Labs on Admission:  Basic Metabolic Panel:  Recent Labs Lab 08/08/12 1437  NA 137  K 2.9*  CL 92*  CO2 29   GLUCOSE 298*  BUN 20  CREATININE 0.85  CALCIUM 8.6   CBC:  Recent Labs Lab 08/08/12 1437  WBC 15.2*  HGB 12.5  HCT 36.0  MCV 91.6  PLT 113*   Radiological Exams on Admission: Ct Abdomen Pelvis W Contrast  08/08/2012  *RADIOLOGY REPORT*  Clinical Data: Short of breath.  Splenomegaly. Abdominal pain.  CT ABDOMEN AND PELVIS WITH CONTRAST  Technique:  Multidetector CT imaging of the abdomen and pelvis was performed following the standard protocol during bolus administration of intravenous contrast.  Contrast: OMNIPAQUE IOHEXOL 300 MG/ML  SOLN  Comparison: 12/23/2010 CT.  Findings: Lung Bases: Collateral flow is identified associated with contrast injection in the left chest wall, suggesting stenosis of the left upper extremity venous return.  Dependent atelectasis is present at the lung bases.  Liver:  Diffuse low attenuation compatible with hepatic steatosis. Hepatomegaly is present with a 25 cm liver span.  Portal vein appears patent.  Spleen:  Splenomegaly with 23 cm span.  The tip of the spleen reaches the level of the iliac crest.  No focal mass lesions.  Gallbladder:  Cholecystectomy.  Common bile duct:  Normal appearance of the common bile duct.  Pancreas:  Normal.  Adrenal glands:  Normal.  Kidneys:  Nonspecific bilateral perinephric stranding.  Normal enhancement and excretion of contrast.  The ureters appear within normal limits.  Stomach:  Mostly decompressed.  No mass lesions or inflammatory changes.  Small bowel:  No obstruction.  No mesenteric adenopathy.  No free fluid.  Colon:   Rectum is decompressed.  Sigmoid colon is within normal limits.  Normal appendix.  Pelvic Genitourinary:  Physiologic appearance of the uterus and adnexa.  29 mm right adnexal cyst with benign features.  Bones:  There are no aggressive osseous lesions.  Lumbar spondylosis and facet arthrosis.  Vasculature: Atherosclerosis.  No aneurysm.  There is marked laxity of abdominal wall.  The patient's pannus  shows inflammatory changes suggesting panniculitis.  Portions of the right flank contact the detector tube, producing artifact.  IMPRESSION: 1.  Hepatosplenomegaly with low attenuation of the liver suggesting hepatic steatosis.  Portal vein is patent.  Cause of Hepatosplenomegaly is unclear. 2.  No acute abdominal findings. 3.   Nonspecific stranding in the patient's pannus may represent panniculitis.   Original Report Authenticated By: Andreas Newport, [name]    Dg Chest Portable 1 View  08/08/2012  *RADIOLOGY REPORT*  Clinical Data: Shortness of breath, cough and fever.  PORTABLE CHEST - 1 VIEW  Comparison: PA and lateral chest 03/24/2012 and portable chest 01/01/2012.  Findings: Lungs are clear.  Heart size is mildly enlarged.  No pneumothorax or pleural fluid.  Tracheostomy tube noted.  IMPRESSION: No acute disease.   Original Report Authenticated By: Holley Dexter, [name]    Assessment/Plan 1-SIRS (systemic inflammatory response syndrome): secondary to UTI/Pyelonephritis most likely. Other considerations is cellulitis/panniculitis on right side of her abdomen. -Patient lactic acid is elevated at 4; will get CT abdomen and pelvis for further evaluation; especially with hx of splenomegaly -check Blood cx and urine culture -empiric treatment with zosyn and vanc -supportive care and IVF's resuscitation.  2-Morbid obesity: chronic; patient with BMI > 40 and > 410 pounds. Discussed about low calorie diet; patient contemplating bariatric surgery.  3-OBESITY HYPOVENTILATION SYNDROME: continue  oxygen supplementation and BIPAP at bedtime through tracheostomy   4-Splenomegaly: with ongoing anemia and thrombocytopenia. Avoid heparin products; monitor level. Stable and currently at baseline  5-Diabetes mellitus type 2 in obese: continue SSI and lantus  6-GERD (gastroesophageal reflux disease): continue PPI  7-Chronic resp failure s/p Tracheostomy dependence: will continue QHS oxygen and BIPAP  use.  8-Urgency incontinence: due to UTI most likely follow urine cx; treatment as mentioned above.  9-Lactic acidosis:due to ongoing SIRS/early sepsis; will provide IVF's and repeat lactic acid  10-Leukocytosis: due to infection. 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  13-Anxiety: continue buspar and PRN Xanax. Stable.   Code Status: Full Family Communication: no family at bedside Disposition Plan: Admit to step down, SIRS/early sepsis most likely due to UIT/pyelonephritis; LOS > 2 midnights  Time spent: >30 minutes  [name] [facility] Pager [phone]  If 7PM-7AM, please contact night-coverage [email or web address] Password [facility] 08/08/2012, 5:56 PM

## 2012-08-08 NOTE — Progress Notes (Signed)
ANTIBIOTIC CONSULT NOTE - INITIAL  Pharmacy Consult for Vanco/Zosyn Indication: Panniculitis/cellulitis, UTI  Allergies  Allergen Reactions  . Molds & Smuts Shortness Of Breath and Rash  . Nitroglycerin Er Nausea And Vomiting  . Accolate (Zafirlukast) Other (See Comments)    Reaction unknown  . Allopurinol     GI  . Amoxicillin-Pot Clavulanate     unknown  . Ciprofloxacin     GI problems  . Codeine     Heart problems  . Fexofenadine     Hurts joints  . Morphine     unknown  . Sulfa Drugs Cross Reactors Nausea And Vomiting    nausea  . Sulfonamide Derivatives     nausea    Patient Measurements: Height: 5\' 8"  (172.7 cm) Weight: 423 lb 8.1 oz (192.1 kg) IBW/kg (Calculated) : 63.9 Adjusted Body Weight:    Vital Signs: Temp: 99 F (37.2 C) (02/14 2045) Temp src: Oral (02/14 2045) BP: 117/57 mmHg (02/14 2045) Pulse Rate: 98 (02/14 2045) Intake/Output from previous day:   Intake/Output from this shift:    Labs:  Recent Labs  08/08/12 1437  WBC 15.2*  HGB 12.5  PLT 113*  CREATININE 0.85   Estimated Creatinine Clearance: 144 ml/min (by C-G formula based on Cr of 0.85). No results found for this basename: VANCOTROUGH, VANCOPEAK, VANCORANDOM, GENTTROUGH, GENTPEAK, GENTRANDOM, TOBRATROUGH, TOBRAPEAK, TOBRARND, AMIKACINPEAK, AMIKACINTROU, AMIKACIN,  in the last 72 hours   Microbiology: No results found for this or any previous visit (from the past 720 hour(s)).  Medical History: Past Medical History  Diagnosis Date  . OSA (obstructive sleep apnea)   . Morbid obesity   . Depressive disorder, not elsewhere classified   . Diabetes mellitus   . Asthma   . Dyslipidemia   . Clostridium difficile enterocolitis 2012  . Splenomegaly 06/01/2011  . Thrombocytopenia 06/01/2011  . Hepatosplenomegaly   . Gout   . GERD (gastroesophageal reflux disease)   . Osteoarthritis   . Polyneuropathy   . Urinary incontinence   . Amenorrhea   . CHF (congestive heart failure)    . Hyperlipidemia   . Leg swelling   . Nausea   . Weakness   . Palpitations   . PONV (postoperative nausea and vomiting)   . Anginal pain     no chest pain in years  . Dysrhythmia     heart palpations  . Anxiety   . Pneumonia   . Shortness of breath   . Chronic kidney disease     overactive bladder  . Peripheral neuropathy     Medications:  Prescriptions prior to admission  Medication Sig Dispense Refill  . acetaminophen (TYLENOL) 500 MG tablet Take 1,000 mg by mouth every 6 (six) hours as needed. For pain      . albuterol (PROVENTIL) (2.5 MG/3ML) 0.083% nebulizer solution Take 2.5 mg by nebulization every 4 (four) hours as needed. For shortness of breath      . ALPRAZolam (XANAX) 0.5 MG tablet Take 0.5 mg by mouth 2 (two) times daily as needed. For anxiety      . atorvastatin (LIPITOR) 10 MG tablet Take 10 mg by mouth at bedtime.       . B Complex Vitamins (VITAMIN B COMPLEX PO) Take 1 tablet by mouth daily.       . budesonide (PULMICORT) 0.25 MG/2ML nebulizer solution Take 0.25 mg by nebulization 3 (three) times daily as needed. For shortness of breath.      . bumetanide (BUMEX) 1 MG tablet  Take 2 mg by mouth 2 (two) times daily.       . busPIRone (BUSPAR) 5 MG tablet Take 7.5 mg by mouth 2 (two) times daily. \      . cholestyramine (QUESTRAN) 4 G packet Take 1 packet by mouth as needed. For loose stools, IBS.      . clotrimazole (LOTRIMIN) 1 % cream Apply 1 application topically daily. To stomach      . colchicine 0.6 MG tablet Take 0.6 mg by mouth daily.      [name] dicyclomine (BENTYL) 20 MG tablet Take 20 mg by mouth 2 (two) times daily.        . DULoxetine (CYMBALTA) 30 MG capsule Take 30 mg by mouth 2 (two) times daily.        . ergocalciferol (VITAMIN D2) 50000 UNITS capsule Take 50,000 Units by mouth once a week. Take on Fridays      . Febuxostat (ULORIC) 80 MG TABS Take 80 mg by mouth at bedtime.       . fesoterodine (TOVIAZ) 4 MG TB24 Take 4 mg by mouth daily.        .  fish oil-omega-3 fatty acids 1000 MG capsule Take 1 g by mouth 2 (two) times daily.       [name] gabapentin (NEURONTIN) 100 MG capsule Take 100 mg by mouth 4 (four) times daily.       [name] guaiFENesin (MUCINEX) 600 MG 12 hr tablet Take 600 mg by mouth 2 (two) times daily.       [name] HYDROcodone-acetaminophen (NORCO/VICODIN) 5-325 MG per tablet Take 1 tablet by mouth every 4 (four) hours as needed for pain.  10 tablet  0  . HYDROcodone-acetaminophen (VICODIN) 5-500 MG per tablet Take 1 tablet by mouth every 6 (six) hours as needed. For pain      . insulin aspart (NOVOLOG) 100 UNIT/ML injection Inject 16 Units into the skin 3 (three) times daily before meals.       . insulin glargine (LANTUS) 100 UNIT/ML injection Inject 70-92 Units into the skin 2 (two) times daily. Sliding scale      . ipratropium (ATROVENT) 0.02 % nebulizer solution Take 2.5 mLs (500 mcg total) by nebulization 4 (four) times daily.  75 mL  12  . Liraglutide (VICTOZA) 18 MG/3ML SOLN Inject 1.8 mLs into the skin every morning.      . loratadine (CLARITIN) 10 MG tablet Take 10 mg by mouth daily as needed. For allergies      . meclizine (ANTIVERT) 25 MG tablet Take 25 mg by mouth daily.      . metolazone (ZAROXOLYN) 5 MG tablet Take 5 mg by mouth 3 (three) times a week.       . Multiple Vitamin (MULTIVITAMIN) capsule Take 1 capsule by mouth daily.        . nebivolol (BYSTOLIC) 10 MG tablet Take 10 mg by mouth daily.        [name] omeprazole (PRILOSEC) 40 MG capsule Take 40 mg by mouth 2 (two) times daily.       . polyethylene glycol (MIRALAX / GLYCOLAX) packet Take 17 g by mouth daily as needed (for constipation). For constipation.      . potassium chloride SA (K-DUR,KLOR-CON) 20 MEQ tablet Take 20 mEq by mouth 3 (three) times daily.       . primidone (MYSOLINE) 50 MG tablet Take 25-75 mg by mouth 2 (two) times daily. Take 25 MG in the morning and 75 MG at bedtime.      [name]  PROBIOTIC CAPS Take 1 capsule by mouth daily.       . promethazine (PHENERGAN)  25 MG tablet Take 25 mg by mouth every 6 (six) hours as needed. For nausea/vomiting      . senna-docusate (SENOKOT-S) 8.6-50 MG per tablet Take 1 tablet by mouth daily as needed. For constipation      . topiramate (TOPAMAX) 25 MG tablet Take 1 tablet (25 mg total) by mouth daily.      . valsartan (DIOVAN) 160 MG tablet Take 160 mg by mouth daily.        [name] warfarin (COUMADIN) 5 MG tablet Take 5-7.5 mg by mouth daily. Take 1 tablet (5mg ) on Monday, Tuesday, Thursday, Friday.   Take 1.5 tablet (7.5mg ) on Sunday, Wednesday, and saturday       Assessment: left lower back pain, dysuria and fever with urinary urgency and dysuria. Complicated PMH. Lactic acid elevated due to SIRS/early sepsis. No mention of cellulits/panniculitis in MD note but it is on consult order.  Pertinent labs: Scr 0.85,  WBC elevated at 15.2. K=2.9, lactic acid=4  Goal of Therapy:  Vancomycin trough level 15-20 mcg/ml for sepsis  Plan:  Zosyn 3.375g IV q8hrs Vancomycin 2000mg  IV x 1 then 1250mg  IV q12h hrs.  Misty Stanley Stillinger 08/08/2012,10:01 PM

## 2012-08-08 NOTE — ED Provider Notes (Signed)
History    52 year old female with left lower back pain, dysuria and fever. Gradual onset lower back pain about 2 days ago. Atraumatic. Achy in character. Progressively worsening. Urinary urgency and dysuria in the past day. Subjective fever chills. Nauseated but no vomiting. No unusual vaginal bleeding or discharge. Patient with a complicated past medical history. Chronic cough. Denies any acute change. Chronic shortness of breath without acute change either.  CSN: [id number]  Arrival date & time 08/08/12  1337   First MD Initiated Contact with Patient 08/08/12 1339      Chief Complaint  Patient presents with  . Flank Pain    (Consider location/radiation/quality/duration/timing/severity/associated sxs/prior treatment) HPI  Past Medical History  Diagnosis Date  . OSA (obstructive sleep apnea)   . Morbid obesity   . Depressive disorder, not elsewhere classified   . Diabetes mellitus   . Asthma   . Dyslipidemia   . Clostridium difficile enterocolitis 2012  . Splenomegaly 06/01/2011  . Thrombocytopenia 06/01/2011  . Hepatosplenomegaly   . Gout   . GERD (gastroesophageal reflux disease)   . Osteoarthritis   . Polyneuropathy   . Urinary incontinence   . Amenorrhea   . CHF (congestive heart failure)   . Hyperlipidemia   . Leg swelling   . Nausea   . Weakness   . Palpitations   . PONV (postoperative nausea and vomiting)   . Anginal pain     no chest pain in years  . Dysrhythmia     heart palpations  . Anxiety   . Pneumonia   . Shortness of breath   . Chronic kidney disease     overactive bladder  . Peripheral neuropathy     Past Surgical History  Procedure Laterality Date  . Cholecystectomy    . Tracheostomy    . Endometrial ablation    . Skin graft    . Finger surgery      ring finger on right hand  . Esophageal dilation  2003  . Breast biopsy      needle core, left  . Dilation and curettage of uterus      times 2  . Tracheal dilitation  01/24/2012     Procedure: TRACHEAL DILITATION;  Surgeon: [name], MD;  Location: [facility] OR;  Service: ENT;  Laterality: N/A;  Trache change with possible dilation, from size 6 to size 7 uncuffed    Family History  Problem Relation Age of Onset  . Diabetes Father   . Hypertension Father   . Dementia Father   . Pneumonia Father   . Heart disease Mother   . Clotting disorder Mother   . Hypertension Mother   . Heart attack Mother   . Multiple myeloma Paternal Grandmother   . Cancer Paternal Grandmother     multiple myoloma  . Heart disease Paternal Grandfather   . Kidney disease Maternal Grandmother   . Hypertension Sister   . Cancer Paternal Aunt     breast  . Cancer Cousin     breast - all three    History  Substance Use Topics  . Smoking status: Never Smoker   . Smokeless tobacco: Never Used  . Alcohol Use: No     Comment: occasional - once or twice a year    OB History   Grav Para Term Preterm Abortions TAB SAB Ect Mult Living   0               Review of Systems  All systems reviewed  and negative, other than as noted in HPI.   Allergies  Molds & smuts; Nitroglycerin er; Accolate; Allopurinol; Amoxicillin-pot clavulanate; Ciprofloxacin; Codeine; Fexofenadine; Morphine; Sulfa drugs cross reactors; and Sulfonamide derivatives  Home Medications   Current Outpatient Rx  Name  Route  Sig  Dispense  Refill  . acetaminophen (TYLENOL) 500 MG tablet   Oral   Take 1,000 mg by mouth every 6 (six) hours as needed. For pain         . albuterol (PROVENTIL) (2.5 MG/3ML) 0.083% nebulizer solution   Nebulization   Take 2.5 mg by nebulization every 4 (four) hours as needed. For shortness of breath         . ALPRAZolam (XANAX) 0.5 MG tablet   Oral   Take 0.5 mg by mouth 2 (two) times daily as needed. For anxiety         . atorvastatin (LIPITOR) 10 MG tablet   Oral   Take 10 mg by mouth at bedtime.          . B Complex Vitamins (VITAMIN B COMPLEX PO)   Oral   Take 1 tablet by  mouth daily.          . budesonide (PULMICORT) 0.25 MG/2ML nebulizer solution   Nebulization   Take 0.25 mg by nebulization 3 (three) times daily as needed. For shortness of breath.         . bumetanide (BUMEX) 1 MG tablet   Oral   Take 2 mg by mouth 2 (two) times daily.          . busPIRone (BUSPAR) 5 MG tablet   Oral   Take 7.5 mg by mouth 2 (two) times daily. \         . cholestyramine (QUESTRAN) 4 G packet   Oral   Take 1 packet by mouth as needed. For loose stools, IBS.         . clotrimazole (LOTRIMIN) 1 % cream   Topical   Apply 1 application topically daily. To stomach         . colchicine 0.6 MG tablet   Oral   Take 0.6 mg by mouth daily.         [name] dicyclomine (BENTYL) 20 MG tablet   Oral   Take 20 mg by mouth 2 (two) times daily.           . DULoxetine (CYMBALTA) 30 MG capsule   Oral   Take 30 mg by mouth 2 (two) times daily.           . ergocalciferol (VITAMIN D2) 50000 UNITS capsule   Oral   Take 50,000 Units by mouth once a week. Take on Fridays         . Febuxostat (ULORIC) 80 MG TABS   Oral   Take 80 mg by mouth at bedtime.          . fesoterodine (TOVIAZ) 4 MG TB24   Oral   Take 4 mg by mouth daily.           . fish oil-omega-3 fatty acids 1000 MG capsule   Oral   Take 1 g by mouth 2 (two) times daily.          [name] gabapentin (NEURONTIN) 100 MG capsule   Oral   Take 100 mg by mouth 4 (four) times daily.          [name] guaiFENesin (MUCINEX) 600 MG 12 hr tablet   Oral   Take 600  mg by mouth 2 (two) times daily.          [name] HYDROcodone-acetaminophen (NORCO/VICODIN) 5-325 MG per tablet   Oral   Take 1 tablet by mouth every 4 (four) hours as needed for pain.   10 tablet   0   . HYDROcodone-acetaminophen (VICODIN) 5-500 MG per tablet   Oral   Take 1 tablet by mouth every 6 (six) hours as needed. For pain         . insulin aspart (NOVOLOG) 100 UNIT/ML injection   Subcutaneous   Inject 16 Units into the skin 3  (three) times daily before meals.          . insulin glargine (LANTUS) 100 UNIT/ML injection   Subcutaneous   Inject 70-92 Units into the skin 2 (two) times daily. Sliding scale         . ipratropium (ATROVENT) 0.02 % nebulizer solution   Nebulization   Take 2.5 mLs (500 mcg total) by nebulization 4 (four) times daily.   75 mL   12   . Liraglutide (VICTOZA) 18 MG/3ML SOLN   Subcutaneous   Inject 1.8 mLs into the skin every morning.         . loratadine (CLARITIN) 10 MG tablet   Oral   Take 10 mg by mouth daily as needed. For allergies         . meclizine (ANTIVERT) 25 MG tablet   Oral   Take 25 mg by mouth daily.         . metolazone (ZAROXOLYN) 5 MG tablet   Oral   Take 5 mg by mouth 3 (three) times a week.          . Multiple Vitamin (MULTIVITAMIN) capsule   Oral   Take 1 capsule by mouth daily.           . nebivolol (BYSTOLIC) 10 MG tablet   Oral   Take 10 mg by mouth daily.           [name] omeprazole (PRILOSEC) 40 MG capsule   Oral   Take 40 mg by mouth 2 (two) times daily.          . polyethylene glycol (MIRALAX / GLYCOLAX) packet   Oral   Take 17 g by mouth daily as needed (for constipation). For constipation.         . potassium chloride SA (K-DUR,KLOR-CON) 20 MEQ tablet   Oral   Take 20 mEq by mouth 3 (three) times daily.          . primidone (MYSOLINE) 50 MG tablet   Oral   Take 25-75 mg by mouth 2 (two) times daily. Take 25 MG in the morning and 75 MG at bedtime.         [name] PROBIOTIC CAPS   Oral   Take 1 capsule by mouth daily.          . promethazine (PHENERGAN) 25 MG tablet   Oral   Take 25 mg by mouth every 6 (six) hours as needed. For nausea/vomiting         . senna-docusate (SENOKOT-S) 8.6-50 MG per tablet   Oral   Take 1 tablet by mouth daily as needed. For constipation         . topiramate (TOPAMAX) 25 MG tablet   Oral   Take 1 tablet (25 mg total) by mouth daily.         . valsartan (DIOVAN) 160 MG  tablet   Oral  Take 160 mg by mouth daily.           [name] warfarin (COUMADIN) 5 MG tablet   Oral   Take 5-7.5 mg by mouth daily. Take 1 tablet (5mg ) on Monday, Tuesday, Thursday, Friday.   Take 1.5 tablet (7.5mg ) on Sunday, Wednesday, and saturday           BP 119/57  Pulse 111  Temp(Src) 100.5 F (38.1 C) (Oral)  Resp 24  SpO2 96%  Physical Exam  Nursing note and vitals reviewed. Constitutional:  Sitting straight up in bed. Mildly uncomfortable appearing. Morbidly obese the  HENT:  Head: Normocephalic and atraumatic.  Eyes: Conjunctivae are normal. Right eye exhibits no discharge. Left eye exhibits no discharge.  Neck: Neck supple.  Cardiovascular: Regular rhythm and normal heart sounds.  Exam reveals no gallop and no friction rub.   No murmur heard. Tachycardic  Pulmonary/Chest: Effort normal and breath sounds normal. No respiratory distress.  trach  Abdominal: Soft. She exhibits no distension. There is no tenderness.  Abdominal exam is limited by patient's body habitus, but she does not seem to have any tenderness.  Genitourinary:  Left CVA tenderness  Musculoskeletal: She exhibits no edema and no tenderness.  Neurological: She is alert.  Skin: Skin is warm and dry.  Psychiatric: She has a normal mood and affect. Her behavior is normal. Thought content normal.    ED Course  Procedures (including critical care time)  CRITICAL CARE Performed by: Raeford Razor   Total critical care time: 35 minutes   Critical care time was exclusive of separately billable procedures and treating other patients.  Critical care was necessary to treat or prevent imminent or life-threatening deterioration.  Critical care was time spent personally by me on the following activities: development of treatment plan with patient and/or surrogate as well as nursing, discussions with consultants, evaluation of patient's response to treatment, examination of patient, obtaining history  from patient or surrogate, ordering and performing treatments and interventions, ordering and review of laboratory studies, ordering and review of radiographic studies, pulse oximetry and re-evaluation of patient's condition.   Labs Reviewed  URINALYSIS, ROUTINE W REFLEX MICROSCOPIC - Abnormal; Notable for the following:    Color, Urine AMBER (*)    APPearance CLOUDY (*)    Bilirubin Urine SMALL (*)    Ketones, ur 15 (*)    All other components within normal limits  CBC - Abnormal; Notable for the following:    WBC 15.2 (*)    Platelets 113 (*)    All other components within normal limits  BASIC METABOLIC PANEL - Abnormal; Notable for the following:    Potassium 2.9 (*)    Chloride 92 (*)    Glucose, Bld 298 (*)    GFR calc non Af Amer 79 (*)    All other components within normal limits  LACTIC ACID, PLASMA - Abnormal; Notable for the following:    Lactic Acid, Venous 4.2 (*)    All other components within normal limits  CULTURE, BLOOD (ROUTINE X 2)  CULTURE, BLOOD (ROUTINE X 2)   Dg Chest Portable 1 View  08/08/2012  *RADIOLOGY REPORT*  Clinical Data: Shortness of breath, cough and fever.  PORTABLE CHEST - 1 VIEW  Comparison: PA and lateral chest 03/24/2012 and portable chest 01/01/2012.  Findings: Lungs are clear.  Heart size is mildly enlarged.  No pneumothorax or pleural fluid.  Tracheostomy tube noted.  IMPRESSION: No acute disease.   Original Report Authenticated By: Holley Dexter,  [name]      1. SIRS (systemic inflammatory response syndrome)   2. Hypokalemia   3. Elevated lactic acid level       MDM  52 year old female with right lower back pain, urinary symptoms and fever. Clinically suspect pyelonephritis. Will check urine and labs. Antipyretics and pain medication. We'll continue to reassess.  Based on pt's symptoms, she likely has pyelo, UA not particularly suggestive of though. Culture sent. Pt with leukocytosis and elevated lactic acid. Will started empiric abx,  although not exactly clear what treating at this time. Allergy listed to augmentin, but I discussed with pt and says she has taken it without problem. Zosyn ordered. Additional IVF.  Will check CT A/P. Abdomen seems benign but exam limited because of large body habitus. Will need admit.         Raeford Razor, MD 08/08/12 [phone]

## 2012-08-08 NOTE — Progress Notes (Signed)
ANTICOAGULATION CONSULT NOTE - Initial Consult  Pharmacy Consult for Coumadin Indication: Clotting disorder  Allergies  Allergen Reactions  . Molds & Smuts Shortness Of Breath and Rash  . Nitroglycerin Er Nausea And Vomiting  . Accolate (Zafirlukast) Other (See Comments)    Reaction unknown  . Allopurinol     GI  . Amoxicillin-Pot Clavulanate     unknown  . Ciprofloxacin     GI problems  . Codeine     Heart problems  . Fexofenadine     Hurts joints  . Morphine     unknown  . Sulfa Drugs Cross Reactors Nausea And Vomiting    nausea  . Sulfonamide Derivatives     nausea    Patient Measurements: Height: 5' 8.11" (173 cm) Weight: 404 lb 12.2 oz (183.6 kg) IBW/kg (Calculated) : 64.15 Heparin Dosing Weight:   Vital Signs: Temp: 98.8 F (37.1 C) (02/14 1942) Temp src: Oral (02/14 1942) BP: 107/39 mmHg (02/14 1900) Pulse Rate: 99 (02/14 1900)  Labs:  Recent Labs  08/08/12 1437 08/08/12 1913  HGB 12.5  --   HCT 36.0  --   PLT 113*  --   LABPROT  --  29.6*  INR  --  3.01*  CREATININE 0.85  --     Estimated Creatinine Clearance: 140 ml/min (by C-G formula based on Cr of 0.85).   Medical History: Past Medical History  Diagnosis Date  . OSA (obstructive sleep apnea)   . Morbid obesity   . Depressive disorder, not elsewhere classified   . Diabetes mellitus   . Asthma   . Dyslipidemia   . Clostridium difficile enterocolitis 2012  . Splenomegaly 06/01/2011  . Thrombocytopenia 06/01/2011  . Hepatosplenomegaly   . Gout   . GERD (gastroesophageal reflux disease)   . Osteoarthritis   . Polyneuropathy   . Urinary incontinence   . Amenorrhea   . CHF (congestive heart failure)   . Hyperlipidemia   . Leg swelling   . Nausea   . Weakness   . Palpitations   . PONV (postoperative nausea and vomiting)   . Anginal pain     no chest pain in years  . Dysrhythmia     heart palpations  . Anxiety   . Pneumonia   . Shortness of breath   . Chronic kidney disease      overactive bladder  . Peripheral neuropathy     Medications:  Scheduled:  . sodium chloride   Intravenous Once  . [COMPLETED] acetaminophen  650 mg Oral Once  . [COMPLETED] fentaNYL  75 mcg Intravenous Once  . [START ON 08/09/2012] insulin aspart  0-20 Units Subcutaneous TID WC  . [EXPIRED] iohexol  25 mL Oral Q1 Hr x 2  . [COMPLETED] ondansetron (ZOFRAN) IV  4 mg Intravenous Once  . piperacillin-tazobactam  3.375 g Intravenous Once  . [COMPLETED] potassium chloride  10 mEq Intravenous Once  . [COMPLETED] potassium chloride  40 mEq Oral Once  . [COMPLETED] sodium chloride  1,000 mL Intravenous Once  . [COMPLETED] sodium chloride  500 mL Intravenous Once  . warfarin  5 mg Oral Once  . [START ON 08/09/2012] Warfarin - Pharmacist Dosing Inpatient   Does not apply q1800    Assessment: 52 yr old female with a complicated past medical history presents to the ED with lower back pain, dysuria and fever. She is thought to have pyelonephritis based on her symptoms. She was started on Zosyn and pharmacy was asked to dose her coumadin  which she was on PTA. Home dose is 5 mg on M,Tu, Th,F. And 7.5 mg on Wed, Sat, Sun.  Her INR is 3.01. Goal of Therapy:  INR 2-3   Plan:  Since the pt's INR is at the top of the desired range, will continue her home dosing and give 5 mg Coumadin today. Daily INR ordered.  Eugene Garnet 08/08/2012,8:17 PM

## 2012-08-08 NOTE — Progress Notes (Signed)
RT spoke with patient about what she uses at night when she is at home.  Patient stated that she uses a ventilator but did not know her settings.  Patient gave RT her home care therapist's number.  RT placed a call to home care therapist to attempt to figure out patients settings but was unable to reach her.

## 2012-08-09 DIAGNOSIS — E876 Hypokalemia: Secondary | ICD-10-CM

## 2012-08-09 DIAGNOSIS — Z930 Tracheostomy status: Secondary | ICD-10-CM

## 2012-08-09 LAB — BASIC METABOLIC PANEL
BUN: 23 mg/dL (ref 6–23)
CO2: 30 mEq/L (ref 19–32)
Calcium: 8.2 mg/dL — ABNORMAL LOW (ref 8.4–10.5)
Chloride: 93 mEq/L — ABNORMAL LOW (ref 96–112)
Creatinine, Ser: 1.03 mg/dL (ref 0.50–1.10)
GFR calc Af Amer: 72 mL/min — ABNORMAL LOW (ref >90)
GFR calc non Af Amer: 62 mL/min — ABNORMAL LOW (ref >90)
Glucose, Bld: 234 mg/dL — ABNORMAL HIGH (ref 70–99)
Potassium: 2.9 mEq/L — ABNORMAL LOW (ref 3.5–5.1)
Sodium: 136 mEq/L (ref 135–145)

## 2012-08-09 LAB — PROTIME-INR
INR: 2.43 — ABNORMAL HIGH (ref 0.00–1.49)
Prothrombin Time: 25.3 seconds — ABNORMAL HIGH (ref 11.6–15.2)

## 2012-08-09 LAB — HEPATIC FUNCTION PANEL
ALT: 36 U/L — ABNORMAL HIGH (ref 0–35)
AST: 40 U/L — ABNORMAL HIGH (ref 0–37)
Albumin: 3.1 g/dL — ABNORMAL LOW (ref 3.5–5.2)
Alkaline Phosphatase: 47 U/L (ref 39–117)
Bilirubin, Direct: 0.1 mg/dL (ref 0.0–0.3)
Indirect Bilirubin: 0.2 mg/dL — ABNORMAL LOW (ref 0.3–0.9)
Total Bilirubin: 0.3 mg/dL (ref 0.3–1.2)
Total Protein: 7.4 g/dL (ref 6.0–8.3)

## 2012-08-09 LAB — URINE CULTURE
Colony Count: NO GROWTH
Culture: NO GROWTH

## 2012-08-09 LAB — HEMOGLOBIN A1C
Hgb A1c MFr Bld: 8.9 % — ABNORMAL HIGH (ref <5.7)
Mean Plasma Glucose: 209 mg/dL — ABNORMAL HIGH (ref <117)

## 2012-08-09 LAB — GLUCOSE, CAPILLARY: Glucose-Capillary: 209 mg/dL — ABNORMAL HIGH (ref 70–99)

## 2012-08-09 LAB — MAGNESIUM: Magnesium: 1.3 mg/dL — ABNORMAL LOW (ref 1.5–2.5)

## 2012-08-09 LAB — CBC
HCT: 33.5 % — ABNORMAL LOW (ref 36.0–46.0)
Hemoglobin: 11.2 g/dL — ABNORMAL LOW (ref 12.0–15.0)
MCH: 31.2 pg (ref 26.0–34.0)
MCHC: 33.4 g/dL (ref 30.0–36.0)
MCV: 93.3 fL (ref 78.0–100.0)
Platelets: 111 10*3/uL — ABNORMAL LOW (ref 150–400)
RBC: 3.59 MIL/uL — ABNORMAL LOW (ref 3.87–5.11)
RDW: 14.6 % (ref 11.5–15.5)
WBC: 12.4 10*3/uL — ABNORMAL HIGH (ref 4.0–10.5)

## 2012-08-09 LAB — CLOSTRIDIUM DIFFICILE BY PCR: Toxigenic C. Difficile by PCR: NEGATIVE

## 2012-08-09 LAB — PHOSPHORUS: Phosphorus: 3.6 mg/dL (ref 2.3–4.6)

## 2012-08-09 MED ORDER — POTASSIUM CHLORIDE CRYS ER 20 MEQ PO TBCR
40.0000 meq | EXTENDED_RELEASE_TABLET | ORAL | Status: AC
  Administered 2012-08-09 (×2): 40 meq via ORAL
  Filled 2012-08-09: qty 2

## 2012-08-09 MED ORDER — POTASSIUM CHLORIDE 10 MEQ/100ML IV SOLN
10.0000 meq | INTRAVENOUS | Status: AC
  Filled 2012-08-09: qty 100

## 2012-08-09 MED ORDER — MAGNESIUM SULFATE 40 MG/ML IJ SOLN
2.0000 g | Freq: Once | INTRAMUSCULAR | Status: AC
  Administered 2012-08-09: 2 g via INTRAVENOUS
  Filled 2012-08-09: qty 50

## 2012-08-09 MED ORDER — SODIUM CHLORIDE 0.9 % IV SOLN
INTRAVENOUS | Status: DC
  Administered 2012-08-09: via INTRAVENOUS
  Administered 2012-08-10: 1000 mL via INTRAVENOUS
  Administered 2012-08-10: 11:00:00 via INTRAVENOUS

## 2012-08-09 MED ORDER — WARFARIN SODIUM 7.5 MG PO TABS
7.5000 mg | ORAL_TABLET | Freq: Once | ORAL | Status: AC
  Administered 2012-08-09: 7.5 mg via ORAL
  Filled 2012-08-09: qty 1

## 2012-08-09 NOTE — Progress Notes (Signed)
Pt. Admitted to Rm. 3304 from ED; vss; patient is alert and oriented X3.  Blood cultures were drawn in ED; zofran and Fent. Were administered in ED as well; 1 1/2 liter bolus' were given before arrival to 3304.  Pt. Oriented to room; will continue to monitor.   Vivi Martens RN

## 2012-08-09 NOTE — Progress Notes (Signed)
Hypokalemia   K replaced  

## 2012-08-09 NOTE — Progress Notes (Signed)
Utilization Review Completed.Athan Casalino T2/15/2014  

## 2012-08-09 NOTE — Progress Notes (Signed)
Pt. Transferred to 2608 via bed; Pt. Stable, transferred so that patient can be on vent while sleeping for her sleep apnea.  VSS at transfer; pt belongings with pt.    Vivi Martens RN

## 2012-08-09 NOTE — Progress Notes (Signed)
[facility] PROGRESS NOTE  [name]:[id number] DOB: 1961-03-13 DOA: 08/08/2012 PCP: Astrid Divine, MD  Assessment/Plan: 1-SIRS (systemic inflammatory response syndrome): secondary to UTI/Pyelonephritis most likely. Other considerations is panniculitis on right side of her abdomen and also cellulitis of her left leg/toes..  -Patient lactic acid is coming down with treatment and currently is 3. -CT abdomen and pelvis showing panniculitis; but not other acute abnormalities. -Follow Blood cx;  urine culture without growth   -Continue empiric treatment with zosyn and vanc  -supportive care and IVF's resuscitation.  -patient improving and getting better. -wound care consult for left foot wound in her toes.  2-Morbid obesity: chronic; patient with BMI > 40 and > 410 pounds. Discussed about low calorie diet; patient contemplating bariatric surgery and diet.[name]   3-OBESITY HYPOVENTILATION SYNDROME: continue oxygen supplementation and BIPAP/ventilator support at bedtime through tracheostomy   4-Splenomegaly: with ongoing anemia and thrombocytopenia. Avoid heparin products; monitor level. Stable and currently at baseline. CT abdomen do not demonstrated any new abnormality other than enlargement.  5-Diabetes mellitus type 2 in obese: continue SSI and lantus   6-GERD (gastroesophageal reflux disease): continue PPI   7-Chronic resp failure s/p Tracheostomy dependence: will continue QHS oxygen and BIPAP/ventilator use; RT to help.   8-Urgency incontinence: due to presumed UTI. Culture w/o growth. Will finish empiric symptomatic treatment  9-Lactic acidosis:due to ongoing SIRS/early sepsis; will continue IVF's and repeat lactic acid in am; is trending down.  10-Leukocytosis: due to infection. Continue empiric abx's; WBC"S trending down.  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    13-Anxiety: continue buspar and PRN Xanax. Stable.   Code Status: Full Family Communication: no family at bedside Disposition Plan: home when medically stable; remains in step down for now.   Consultants:  none  Procedures:  CT abd and pelvis: no abnormalities on her kidneys; findings suggesting panniculitis; also hepatosplenomegaly seen.  Antibiotics:  vanc and zosyn  HPI/Subjective: Afebrile, no CP, no SOB; reports feeling better.  Objective: Filed Vitals:   08/09/12 1136 08/09/12 1411 08/09/12 1530 08/09/12 1548  BP: 111/53  98/57 98/57  Pulse: 90   77  Temp:   98.6 F (37 C) 98.5 F (36.9 C)  TempSrc:   Oral Oral  Resp: 20   18  Height:      Weight:      SpO2: 93% 100%  97%    Intake/Output Summary (Last 24 hours) at 08/09/12 1928 Last data filed at 08/09/12 1801  Gross per 24 hour  Intake   2545 ml  Output   1500 ml  Net   1045 ml   Filed Weights   08/08/12 1700 08/08/12 2045 08/09/12 0145  Weight: 183.6 kg (404 lb 12.2 oz) 192.1 kg (423 lb 8.1 oz) 193.5 kg (426 lb 9.4 oz)    Exam:   General:  Afebrile; feeling better  Cardiovascular: mild tachycardia, no rubs or gallops  Respiratory: CTA  Abdomen: obese, positive distant BS, no tenderness to palpation  Data Reviewed: Basic Metabolic Panel:  Recent Labs Lab 08/08/12 1437 08/08/12 2330 08/09/12 0500  NA 137  --  136  K 2.9*  --  2.9*  CL 92*  --  93*  CO2 29  --  30  GLUCOSE 298*  --  234*  BUN 20  --  23  CREATININE 0.85  --  1.03  CALCIUM 8.6  --  8.2*  MG  --  1.3*  --   PHOS  --  3.6  --    Liver Function Tests:  Recent Labs Lab 08/08/12 2330  AST 40*  ALT 36*  ALKPHOS 47  BILITOT 0.3  PROT 7.4  ALBUMIN 3.1*   CBC:  Recent Labs Lab 08/08/12 1437 08/09/12 0500  WBC 15.2* 12.4*  HGB 12.5 11.2*  HCT 36.0 33.5*  MCV 91.6 93.3  PLT 113* 111*   CBG:  Recent Labs Lab 08/08/12 2124 08/09/12 0143 08/09/12 0806 08/09/12 1232 08/09/12 1741   GLUCAP 269* 239* 209* 271* 267*    Recent Results (from the past 240 hour(s))  CULTURE, BLOOD (ROUTINE X 2)     Status: None   Collection Time    08/08/12  2:55 PM      Result Value Range Status   Specimen Description BLOOD ARM RIGHT   Final   Special Requests BOTTLES DRAWN AEROBIC ONLY 10CC   Final   Culture  Setup Time 08/08/2012 20:05   Final   Culture     Final   Value:        BLOOD CULTURE RECEIVED NO GROWTH TO DATE CULTURE WILL BE HELD FOR 5 DAYS BEFORE ISSUING A FINAL NEGATIVE REPORT   Report Status PENDING   Incomplete  CULTURE, BLOOD (ROUTINE X 2)     Status: None   Collection Time    08/08/12  3:00 PM      Result Value Range Status   Specimen Description BLOOD ARM LEFT   Final   Special Requests BOTTLES DRAWN AEROBIC ONLY 10CC   Final   Culture  Setup Time 08/08/2012 20:05   Final   Culture     Final   Value:        BLOOD CULTURE RECEIVED NO GROWTH TO DATE CULTURE WILL BE HELD FOR 5 DAYS BEFORE ISSUING A FINAL NEGATIVE REPORT   Report Status PENDING   Incomplete  URINE CULTURE     Status: None   Collection Time    08/08/12  3:18 PM      Result Value Range Status   Specimen Description URINE, CATHETERIZED   Final   Special Requests ADDED AT 1548 ON [id number]   Final   Culture  Setup Time 08/08/2012 18:30   Final   Colony Count NO GROWTH   Final   Culture NO GROWTH   Final   Report Status 08/09/2012 FINAL   Final  MRSA PCR SCREENING     Status: None   Collection Time    08/09/12 12:05 AM      Result Value Range Status   MRSA by PCR NEGATIVE  NEGATIVE Final   Comment:            The GeneXpert MRSA Assay (FDA     approved for NASAL specimens     only), is one component of a     comprehensive MRSA colonization     surveillance program. It is not     intended to diagnose MRSA     infection nor to guide or     monitor treatment for     MRSA infections.  CLOSTRIDIUM DIFFICILE BY PCR     Status: None   Collection Time    08/09/12  1:33 PM      Result Value Range  Status   C difficile by pcr NEGATIVE  NEGATIVE Final     Studies: Ct Abdomen Pelvis W Contrast  08/08/2012  *RADIOLOGY REPORT*  Clinical Data: Short of breath.  Splenomegaly. Abdominal pain.  CT ABDOMEN AND PELVIS WITH CONTRAST  Technique:  Multidetector CT imaging of the abdomen and pelvis was performed following the standard protocol during bolus administration of intravenous contrast.  Contrast: OMNIPAQUE IOHEXOL 300 MG/ML  SOLN  Comparison: 12/23/2010 CT.  Findings: Lung Bases: Collateral flow is identified associated with contrast injection in the left chest wall, suggesting stenosis of the left upper extremity venous return.  Dependent atelectasis is present at the lung bases.  Liver:  Diffuse low attenuation compatible with hepatic steatosis. Hepatomegaly is present with a 25 cm liver span.  Portal vein appears patent.  Spleen:  Splenomegaly with 23 cm span.  The tip of the spleen reaches the level of the iliac crest.  No focal mass lesions.  Gallbladder:  Cholecystectomy.  Common bile duct:  Normal appearance of the common bile duct.  Pancreas:  Normal.  Adrenal glands:  Normal.  Kidneys:  Nonspecific bilateral perinephric stranding.  Normal enhancement and excretion of contrast.  The ureters appear within normal limits.  Stomach:  Mostly decompressed.  No mass lesions or inflammatory changes.  Small bowel:  No obstruction.  No mesenteric adenopathy.  No free fluid.  Colon:   Rectum is decompressed.  Sigmoid colon is within normal limits.  Normal appendix.  Pelvic Genitourinary:  Physiologic appearance of the uterus and adnexa.  29 mm right adnexal cyst with benign features.  Bones:  There are no aggressive osseous lesions.  Lumbar spondylosis and facet arthrosis.  Vasculature: Atherosclerosis.  No aneurysm.  There is marked laxity of abdominal wall.  The patient's pannus shows inflammatory changes suggesting panniculitis.  Portions of the right flank contact the detector tube, producing  artifact.  IMPRESSION: 1.  Hepatosplenomegaly with low attenuation of the liver suggesting hepatic steatosis.  Portal vein is patent.  Cause of Hepatosplenomegaly is unclear. 2.  No acute abdominal findings. 3.   Nonspecific stranding in the patient's pannus may represent panniculitis.   Original Report Authenticated By: Andreas Newport, [name]    Dg Chest Portable 1 View  08/08/2012  *RADIOLOGY REPORT*  Clinical Data: Shortness of breath, cough and fever.  PORTABLE CHEST - 1 VIEW  Comparison: PA and lateral chest 03/24/2012 and portable chest 01/01/2012.  Findings: Lungs are clear.  Heart size is mildly enlarged.  No pneumothorax or pleural fluid.  Tracheostomy tube noted.  IMPRESSION: No acute disease.   Original Report Authenticated By: Holley Dexter, [name]     Scheduled Meds: . acidophilus  1 capsule Oral Daily  . atorvastatin  10 mg Oral QHS  . bumetanide  2 mg Oral BID  . busPIRone  7.5 mg Oral BID  . colchicine  0.6 mg Oral Daily  . dicyclomine  20 mg Oral BID  . DULoxetine  30 mg Oral BID  . febuxostat  80 mg Oral QHS  . fesoterodine  4 mg Oral Daily  . gabapentin  100 mg Oral QID  . guaiFENesin  600 mg Oral BID  . insulin aspart  0-20 Units Subcutaneous TID WC  . insulin glargine  90 Units Subcutaneous BID  . ipratropium  500 mcg Nebulization QID  . irbesartan  150 mg Oral Daily  . [START ON 08/11/2012] metolazone  5 mg Oral 3 times weekly  . multivitamin with minerals  1 tablet Oral Daily  . nebivolol  10 mg Oral Daily  . pantoprazole  40 mg Oral BID  . piperacillin-tazobactam  3.375 g Intravenous Once  .  piperacillin-tazobactam (ZOSYN)  IV  3.375 g Intravenous Q8H  . potassium chloride SA  20 mEq Oral TID  . primidone  25 mg Oral Daily  . primidone  75 mg Oral QHS  . topiramate  25 mg Oral Daily  . vancomycin  1,250 mg Intravenous Q12H  . Warfarin - Pharmacist Dosing Inpatient   Does not apply q1800   Continuous Infusions: . sodium chloride      Principal Problem:    SIRS (systemic inflammatory response syndrome) Active Problems:   Morbid obesity   OBESITY HYPOVENTILATION SYNDROME   Splenomegaly   Diabetes mellitus type 2 in obese   GERD (gastroesophageal reflux disease)   Tracheostomy dependence   Urgency incontinence   Lactic acidosis   Leukocytosis    Time spent: >30 minutes  Kylan Liberati  [facility] Pager [phone]. If 8PM-8AM, please contact night-coverage at [email or web address], password [facility] 08/09/2012, 7:28 PM  LOS: 1 day

## 2012-08-09 NOTE — Progress Notes (Signed)
ANTICOAGULATION CONSULT NOTE - Follow Up Consult  Pharmacy Consult for Coumadin Indication: history of VTE  Allergies  Allergen Reactions  . Molds & Smuts Shortness Of Breath and Rash  . Nitroglycerin Er Nausea And Vomiting  . Accolate (Zafirlukast) Other (See Comments)    Reaction unknown  . Allopurinol     GI  . Amoxicillin-Pot Clavulanate     unknown  . Ciprofloxacin     GI problems  . Codeine     Heart problems  . Fexofenadine     Hurts joints  . Morphine     unknown  . Sulfa Drugs Cross Reactors Nausea And Vomiting    nausea  . Sulfonamide Derivatives     nausea    Patient Measurements: Height: 5\' 8"  (172.7 cm) Weight: 426 lb 9.4 oz (193.5 kg) IBW/kg (Calculated) : 63.9  Vital Signs: Temp: 98.2 F (36.8 C) (02/15 1131) Temp src: Oral (02/15 1131) BP: 111/53 mmHg (02/15 1136) Pulse Rate: 90 (02/15 1136)  Labs:  Recent Labs  08/08/12 1437 08/08/12 1913 08/09/12 0500  HGB 12.5  --  11.2*  HCT 36.0  --  33.5*  PLT 113*  --  111*  LABPROT  --  29.6* 25.3*  INR  --  3.01* 2.43*  CREATININE 0.85  --  1.03    Estimated Creatinine Clearance: 119.4 ml/min (by C-G formula based on Cr of 1.03).   Medications:  Scheduled:  . [COMPLETED] acetaminophen  650 mg Oral Once  . acidophilus  1 capsule Oral Daily  . atorvastatin  10 mg Oral QHS  . bumetanide  2 mg Oral BID  . busPIRone  7.5 mg Oral BID  . colchicine  0.6 mg Oral Daily  . dicyclomine  20 mg Oral BID  . DULoxetine  30 mg Oral BID  . febuxostat  80 mg Oral QHS  . [COMPLETED] fentaNYL  75 mcg Intravenous Once  . fesoterodine  4 mg Oral Daily  . gabapentin  100 mg Oral QID  . guaiFENesin  600 mg Oral BID  . insulin aspart  0-20 Units Subcutaneous TID WC  . insulin glargine  90 Units Subcutaneous BID  . [EXPIRED] iohexol  25 mL Oral Q1 Hr x 2  . ipratropium  500 mcg Nebulization QID  . irbesartan  150 mg Oral Daily  . [COMPLETED] magnesium sulfate 1 - 4 g bolus IVPB  2 g Intravenous Once  .  [START ON 08/11/2012] metolazone  5 mg Oral 3 times weekly  . multivitamin with minerals  1 tablet Oral Daily  . nebivolol  10 mg Oral Daily  . [COMPLETED] ondansetron (ZOFRAN) IV  4 mg Intravenous Once  . pantoprazole  40 mg Oral BID  . piperacillin-tazobactam  3.375 g Intravenous Once  . piperacillin-tazobactam (ZOSYN)  IV  3.375 g Intravenous Q8H  . [COMPLETED] potassium chloride  10 mEq Intravenous Once  . [EXPIRED] potassium chloride  10 mEq Intravenous Q1 Hr x 2  . potassium chloride SA  20 mEq Oral TID  . [COMPLETED] potassium chloride  40 mEq Oral Once  . potassium chloride  40 mEq Oral Q4H  . primidone  25 mg Oral Daily  . primidone  75 mg Oral QHS  . [COMPLETED] sodium chloride  1,000 mL Intravenous Once  . [COMPLETED] sodium chloride  500 mL Intravenous Once  . topiramate  25 mg Oral Daily  . vancomycin  1,250 mg Intravenous Q12H  . [COMPLETED] vancomycin  2,000 mg Intravenous Once  . [COMPLETED]  warfarin  5 mg Oral Once  . Warfarin - Pharmacist Dosing Inpatient   Does not apply q1800  . [COMPLETED] sodium chloride   Intravenous Once  . [DISCONTINUED] multivitamin  1 capsule Oral Daily  . [DISCONTINUED] primidone  25-75 mg Oral BID  . [DISCONTINUED] PROBIOTIC  1 capsule Oral Daily    Assessment: 52 yr old female with a complicated past medical history presents to the ED with lower back pain, dysuria and fever. Pharmacy to dose Coumadin for history of clotting disorder and history of VTE. Patient's home dose is 5 mg on M,Tu, Th,F. And 7.5 mg on Wed, Sat, Sun. Her INR on admission was 3.01. Today's INR is 2.43.  CBC is slightly low. Platelets 111 but appear stable. No bleeding noted.  Goal of Therapy:   INR 2-3 Monitor platelets by anticoagulation protocol: Yes   Plan:  - Coumadin 7.5mg  tonight at 1800  - F/u daily INR  Lillia Pauls, PharmD Clinical Pharmacist Pager: [phone] Phone: [phone] 08/09/2012 3:18 PM

## 2012-08-10 DIAGNOSIS — L0390 Cellulitis, unspecified: Secondary | ICD-10-CM

## 2012-08-10 DIAGNOSIS — L0291 Cutaneous abscess, unspecified: Secondary | ICD-10-CM

## 2012-08-10 LAB — BASIC METABOLIC PANEL
BUN: 27 mg/dL — ABNORMAL HIGH (ref 6–23)
Calcium: 8 mg/dL — ABNORMAL LOW (ref 8.4–10.5)
Creatinine, Ser: 1.21 mg/dL — ABNORMAL HIGH (ref 0.50–1.10)
GFR calc Af Amer: 59 mL/min — ABNORMAL LOW (ref >90)
GFR calc non Af Amer: 51 mL/min — ABNORMAL LOW (ref >90)

## 2012-08-10 LAB — PROTIME-INR
INR: 2.62 — ABNORMAL HIGH (ref 0.00–1.49)
Prothrombin Time: 26.7 seconds — ABNORMAL HIGH (ref 11.6–15.2)

## 2012-08-10 LAB — LACTIC ACID, PLASMA: Lactic Acid, Venous: 2.5 mmol/L — ABNORMAL HIGH (ref 0.5–2.2)

## 2012-08-10 MED ORDER — INSULIN GLARGINE 100 UNIT/ML ~~LOC~~ SOLN
95.0000 [IU] | Freq: Two times a day (BID) | SUBCUTANEOUS | Status: DC
  Administered 2012-08-10 – 2012-08-11 (×2): 95 [IU] via SUBCUTANEOUS

## 2012-08-10 MED ORDER — LIRAGLUTIDE 18 MG/3ML ~~LOC~~ SOLN: 8.0000 mg | Freq: Every morning | SUBCUTANEOUS | Status: DC

## 2012-08-10 MED ORDER — WARFARIN SODIUM 7.5 MG PO TABS
7.5000 mg | ORAL_TABLET | Freq: Once | ORAL | Status: AC
  Administered 2012-08-10: 7.5 mg via ORAL
  Filled 2012-08-10 (×2): qty 1

## 2012-08-10 MED ORDER — DOXYCYCLINE HYCLATE 100 MG PO TABS
100.0000 mg | ORAL_TABLET | Freq: Two times a day (BID) | ORAL | Status: DC
  Administered 2012-08-10 – 2012-08-11 (×3): 100 mg via ORAL
  Filled 2012-08-10 (×4): qty 1

## 2012-08-10 NOTE — Progress Notes (Signed)
ANTICOAGULATION CONSULT NOTE - Follow Up Consult  Pharmacy Consult for Coumadin Indication: history of VTE  Allergies  Allergen Reactions  . Molds & Smuts Shortness Of Breath and Rash  . Nitroglycerin Er Nausea And Vomiting  . Accolate (Zafirlukast) Other (See Comments)    Reaction unknown  . Allopurinol     GI  . Amoxicillin-Pot Clavulanate     unknown  . Ciprofloxacin     GI problems  . Codeine     Heart problems  . Fexofenadine     Hurts joints  . Morphine     unknown  . Sulfa Drugs Cross Reactors Nausea And Vomiting    nausea  . Sulfonamide Derivatives     nausea    Patient Measurements: Height: 5\' 8"  (172.7 cm) Weight: 432 lb 1.6 oz (196 kg) IBW/kg (Calculated) : 63.9  Vital Signs: Temp: 98.2 F (36.8 C) (02/16 0440) Temp src: Oral (02/16 0440) BP: 103/41 mmHg (02/16 0440) Pulse Rate: 78 (02/16 0838)  Labs:  Recent Labs  08/08/12 1437 08/08/12 1913 08/09/12 0500 08/10/12 0500  HGB 12.5  --  11.2*  --   HCT 36.0  --  33.5*  --   PLT 113*  --  111*  --   LABPROT  --  29.6* 25.3* 26.7*  INR  --  3.01* 2.43* 2.62*  CREATININE 0.85  --  1.03 1.21*    Estimated Creatinine Clearance: 102.5 ml/min (by C-G formula based on Cr of 1.21).   Medications:  Scheduled:  . acidophilus  1 capsule Oral Daily  . atorvastatin  10 mg Oral QHS  . bumetanide  2 mg Oral BID  . busPIRone  7.5 mg Oral BID  . colchicine  0.6 mg Oral Daily  . dicyclomine  20 mg Oral BID  . DULoxetine  30 mg Oral BID  . febuxostat  80 mg Oral QHS  . fesoterodine  4 mg Oral Daily  . gabapentin  100 mg Oral QID  . guaiFENesin  600 mg Oral BID  . insulin aspart  0-20 Units Subcutaneous TID WC  . insulin glargine  90 Units Subcutaneous BID  . ipratropium  500 mcg Nebulization QID  . irbesartan  150 mg Oral Daily  . [START ON 08/11/2012] metolazone  5 mg Oral 3 times weekly  . multivitamin with minerals  1 tablet Oral Daily  . nebivolol  10 mg Oral Daily  . pantoprazole  40 mg Oral  BID  . piperacillin-tazobactam  3.375 g Intravenous Once  . piperacillin-tazobactam (ZOSYN)  IV  3.375 g Intravenous Q8H  . potassium chloride SA  20 mEq Oral TID  . [COMPLETED] potassium chloride  40 mEq Oral Q4H  . primidone  25 mg Oral Daily  . primidone  75 mg Oral QHS  . topiramate  25 mg Oral Daily  . vancomycin  1,250 mg Intravenous Q12H  . [COMPLETED] warfarin  7.5 mg Oral ONCE-1800  . Warfarin - Pharmacist Dosing Inpatient   Does not apply q1800    Assessment: 52 yr old female with a complicated past medical history presents to the ED with lower back pain, dysuria and fever. Pharmacy to dose Coumadin for history of clotting disorder and history of VTE. Patient's home dose is 5 mg on M,Tu, Th, F and 7.5 mg on Wed, Sat, Sun. Her INR on admission was 3.01. Today's INR is 2.62.   CBC is slightly low. Platelets 111 but appear stable. No bleeding noted.  Goal of Therapy:  INR  2-3 Monitor platelets by anticoagulation protocol: Yes   Plan:  - Coumadin 7.5mg  tonight at 1800  - F/u daily INR  Lillia Pauls, PharmD Clinical Pharmacist Pager: [phone] Phone: [phone] 08/10/2012 9:41 AM

## 2012-08-10 NOTE — Progress Notes (Signed)
[facility] PROGRESS NOTE  [name] ZOX:[id number] DOB: Apr 22, 1961 DOA: 08/08/2012 PCP: Astrid Divine, MD  Assessment/Plan: 1-SIRS (systemic inflammatory response syndrome): secondary to panniculitis on right side of her abdomen and also cellulitis of her left leg/toes..  -Patient lactic acid is coming down with treatment and currently is 3. -CT abdomen and pelvis showing panniculitis; but not other acute abnormalities.  -will change antibiotic treatment to doxycyline for cellulitis and panniculitis; urine cx neg. No growth in her blood cx either. -supportive care and IVF's resuscitation (will start decreasing IVF's).  -patient improving and getting better. -wound care consult for left foot wound in her toes.  2-Morbid obesity: chronic; patient with BMI > 40 and > 410 pounds. Discussed about low calorie diet; patient contemplating bariatric surgery and diet.[name]   3-OBESITY HYPOVENTILATION SYNDROME: continue oxygen supplementation and BIPAP/ventilator support at bedtime through tracheostomy   4-Splenomegaly: with ongoing anemia and thrombocytopenia. Avoid heparin products; monitor level. Stable and currently at baseline. CT abdomen do not demonstrated any new abnormality other than enlargement.  5-Diabetes mellitus type 2 in obese: continue SSI and lantus; CBG's still high; will resume victoza  6-GERD (gastroesophageal reflux disease): continue PPI   7-Chronic resp failure s/p Tracheostomy dependence: will continue QHS oxygen and BIPAP/ventilator use; RT to help.   8-Urgency incontinence: due to presumed UTI. Culture w/o growth. Patient finished empiric symptomatic treatment with 3 days of IV zosyn; will now start tx with doxycycline. Denies dysuria.  9-Lactic acidosis:due to ongoing SIRS/early sepsis; will continue IVF's.  10-Leukocytosis: due to infection. Continue empiric abx's; WBC's trending down.  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  13-Anxiety: continue buspar and PRN Xanax. Stable.  14- acute renal insufficiency: probably with dehydration and use of vanc. Will d/c vancomycin, continue IVF's and will follow Cr trend.    Code Status: Full Family Communication: no family at bedside Disposition Plan: home when medically stable; remains in step down for now (for ventilatory support at night).   Consultants: Wound care  Procedures:  CT abd and pelvis: no abnormalities on her kidneys; findings suggesting panniculitis; also hepatosplenomegaly seen.  Antibiotics:  vanc and zosyn (2/14 to 2/16)  Doxycycline 2/16  HPI/Subjective: Afebrile, no CP, no SOB; reports feeling better.  Objective: Filed Vitals:   08/10/12 0033 08/10/12 0440 08/10/12 0838 08/10/12 0939  BP: 112/62 103/41    Pulse: 73 71 78   Temp: 98.1 F (36.7 C) 98.2 F (36.8 C)    TempSrc: Oral Oral    Resp: 18 10 21    Height:      Weight:  196 kg (432 lb 1.6 oz)    SpO2: 97% 100% 98% 98%    Intake/Output Summary (Last 24 hours) at 08/10/12 1121 Last data filed at 08/10/12 1040  Gross per 24 hour  Intake 3676.67 ml  Output   2800 ml  Net 876.67 ml   Filed Weights   08/08/12 2045 08/09/12 0145 08/10/12 0440  Weight: 192.1 kg (423 lb 8.1 oz) 193.5 kg (426 lb 9.4 oz) 196 kg (432 lb 1.6 oz)    Exam:   General:  Afebrile; feeling better; NAD  Cardiovascular: mild tachycardia, no rubs or gallops  Respiratory: CTA  Abdomen: obese, positive distant BS, no tenderness to palpation  Extremities: with 2++ edema bilateral; LLE with erythematous changes and also with wound affecting her middle and fourth toes.  Data Reviewed: Basic Metabolic Panel:  Recent Labs Lab 08/08/12 1437 08/08/12 2330 08/09/12 0500 08/10/12 0500  NA 137  --  136 136  K 2.9*  --  2.9* 3.8  CL 92*  --  93* 95*  CO2 29  --  30 26  GLUCOSE 298*  --  234* 229*  BUN 20  --   23 27*  CREATININE 0.85  --  1.03 1.21*  CALCIUM 8.6  --  8.2* 8.0*  MG  --  1.3*  --  1.5  PHOS  --  3.6  --   --    Liver Function Tests:  Recent Labs Lab 08/08/12 2330  AST 40*  ALT 36*  ALKPHOS 47  BILITOT 0.3  PROT 7.4  ALBUMIN 3.1*   CBC:  Recent Labs Lab 08/08/12 1437 08/09/12 0500  WBC 15.2* 12.4*  HGB 12.5 11.2*  HCT 36.0 33.5*  MCV 91.6 93.3  PLT 113* 111*   CBG:  Recent Labs Lab 08/09/12 0806 08/09/12 1232 08/09/12 1741 08/09/12 2117 08/10/12 0808  GLUCAP 209* 271* 267* 274* 254*    Recent Results (from the past 240 hour(s))  CULTURE, BLOOD (ROUTINE X 2)     Status: None   Collection Time    08/08/12  2:55 PM      Result Value Range Status   Specimen Description BLOOD ARM RIGHT   Final   Special Requests BOTTLES DRAWN AEROBIC ONLY 10CC   Final   Culture  Setup Time 08/08/2012 20:05   Final   Culture     Final   Value:        BLOOD CULTURE RECEIVED NO GROWTH TO DATE CULTURE WILL BE HELD FOR 5 DAYS BEFORE ISSUING A FINAL NEGATIVE REPORT   Report Status PENDING   Incomplete  CULTURE, BLOOD (ROUTINE X 2)     Status: None   Collection Time    08/08/12  3:00 PM      Result Value Range Status   Specimen Description BLOOD ARM LEFT   Final   Special Requests BOTTLES DRAWN AEROBIC ONLY 10CC   Final   Culture  Setup Time 08/08/2012 20:05   Final   Culture     Final   Value:        BLOOD CULTURE RECEIVED NO GROWTH TO DATE CULTURE WILL BE HELD FOR 5 DAYS BEFORE ISSUING A FINAL NEGATIVE REPORT   Report Status PENDING   Incomplete  URINE CULTURE     Status: None   Collection Time    08/08/12  3:18 PM      Result Value Range Status   Specimen Description URINE, CATHETERIZED   Final   Special Requests ADDED AT 1548 ON [id number]   Final   Culture  Setup Time 08/08/2012 18:30   Final   Colony Count NO GROWTH   Final   Culture NO GROWTH   Final   Report Status 08/09/2012 FINAL   Final  MRSA PCR SCREENING     Status: None   Collection Time    08/09/12  12:05 AM      Result Value Range Status   MRSA by PCR NEGATIVE  NEGATIVE Final   Comment:            The GeneXpert MRSA Assay (FDA     approved for NASAL specimens     only), is one component of a     comprehensive MRSA colonization     surveillance program. It is not     intended to diagnose  MRSA     infection nor to guide or     monitor treatment for     MRSA infections.  CLOSTRIDIUM DIFFICILE BY PCR     Status: None   Collection Time    08/09/12  1:33 PM      Result Value Range Status   C difficile by pcr NEGATIVE  NEGATIVE Final     Studies: Ct Abdomen Pelvis W Contrast  08/08/2012  *RADIOLOGY REPORT*  Clinical Data: Short of breath.  Splenomegaly. Abdominal pain.  CT ABDOMEN AND PELVIS WITH CONTRAST  Technique:  Multidetector CT imaging of the abdomen and pelvis was performed following the standard protocol during bolus administration of intravenous contrast.  Contrast: OMNIPAQUE IOHEXOL 300 MG/ML  SOLN  Comparison: 12/23/2010 CT.  Findings: Lung Bases: Collateral flow is identified associated with contrast injection in the left chest wall, suggesting stenosis of the left upper extremity venous return.  Dependent atelectasis is present at the lung bases.  Liver:  Diffuse low attenuation compatible with hepatic steatosis. Hepatomegaly is present with a 25 cm liver span.  Portal vein appears patent.  Spleen:  Splenomegaly with 23 cm span.  The tip of the spleen reaches the level of the iliac crest.  No focal mass lesions.  Gallbladder:  Cholecystectomy.  Common bile duct:  Normal appearance of the common bile duct.  Pancreas:  Normal.  Adrenal glands:  Normal.  Kidneys:  Nonspecific bilateral perinephric stranding.  Normal enhancement and excretion of contrast.  The ureters appear within normal limits.  Stomach:  Mostly decompressed.  No mass lesions or inflammatory changes.  Small bowel:  No obstruction.  No mesenteric adenopathy.  No free fluid.  Colon:   Rectum is decompressed.   Sigmoid colon is within normal limits.  Normal appendix.  Pelvic Genitourinary:  Physiologic appearance of the uterus and adnexa.  29 mm right adnexal cyst with benign features.  Bones:  There are no aggressive osseous lesions.  Lumbar spondylosis and facet arthrosis.  Vasculature: Atherosclerosis.  No aneurysm.  There is marked laxity of abdominal wall.  The patient's pannus shows inflammatory changes suggesting panniculitis.  Portions of the right flank contact the detector tube, producing artifact.  IMPRESSION: 1.  Hepatosplenomegaly with low attenuation of the liver suggesting hepatic steatosis.  Portal vein is patent.  Cause of Hepatosplenomegaly is unclear. 2.  No acute abdominal findings. 3.   Nonspecific stranding in the patient's pannus may represent panniculitis.   Original Report Authenticated By: Andreas Newport, [name]    Dg Chest Portable 1 View  08/08/2012  *RADIOLOGY REPORT*  Clinical Data: Shortness of breath, cough and fever.  PORTABLE CHEST - 1 VIEW  Comparison: PA and lateral chest 03/24/2012 and portable chest 01/01/2012.  Findings: Lungs are clear.  Heart size is mildly enlarged.  No pneumothorax or pleural fluid.  Tracheostomy tube noted.  IMPRESSION: No acute disease.   Original Report Authenticated By: Holley Dexter, [name]     Scheduled Meds: . acidophilus  1 capsule Oral Daily  . atorvastatin  10 mg Oral QHS  . bumetanide  2 mg Oral BID  . busPIRone  7.5 mg Oral BID  . colchicine  0.6 mg Oral Daily  . dicyclomine  20 mg Oral BID  . doxycycline  100 mg Oral Q12H  . DULoxetine  30 mg Oral BID  . febuxostat  80 mg Oral QHS  . fesoterodine  4 mg Oral Daily  . gabapentin  100 mg Oral QID  . guaiFENesin  600 mg Oral BID  . insulin aspart  0-20 Units Subcutaneous TID WC  . insulin glargine  90 Units Subcutaneous BID  . ipratropium  500 mcg Nebulization QID  . irbesartan  150 mg Oral Daily  . [START ON 08/11/2012] metolazone  5 mg Oral 3 times weekly  . multivitamin with  minerals  1 tablet Oral Daily  . nebivolol  10 mg Oral Daily  . pantoprazole  40 mg Oral BID  . piperacillin-tazobactam  3.375 g Intravenous Once  . potassium chloride SA  20 mEq Oral TID  . primidone  25 mg Oral Daily  . primidone  75 mg Oral QHS  . topiramate  25 mg Oral Daily  . warfarin  7.5 mg Oral ONCE-1800  . Warfarin - Pharmacist Dosing Inpatient   Does not apply q1800   Continuous Infusions: . sodium chloride 1,000 mL (08/10/12 0644)    Principal Problem:   SIRS (systemic inflammatory response syndrome) Active Problems:   Morbid obesity   OBESITY HYPOVENTILATION SYNDROME   Splenomegaly   Diabetes mellitus type 2 in obese   GERD (gastroesophageal reflux disease)   Tracheostomy dependence   Urgency incontinence   Lactic acidosis   Leukocytosis    Time spent: >30 minutes  Keiondra Brookover  [facility] Pager [phone]. If 8PM-8AM, please contact night-coverage at [email or web address], password [facility] 08/10/2012, 11:21 AM  LOS: 2 days

## 2012-08-11 ENCOUNTER — Ambulatory Visit: Payer: Medicare Other | Admitting: Physical Therapy

## 2012-08-11 DIAGNOSIS — J309 Allergic rhinitis, unspecified: Secondary | ICD-10-CM

## 2012-08-11 LAB — BASIC METABOLIC PANEL
BUN: 26 mg/dL — ABNORMAL HIGH (ref 6–23)
CO2: 26 mEq/L (ref 19–32)
Chloride: 95 mEq/L — ABNORMAL LOW (ref 96–112)
Creatinine, Ser: 1.08 mg/dL (ref 0.50–1.10)
GFR calc Af Amer: 68 mL/min — ABNORMAL LOW (ref >90)
Glucose, Bld: 223 mg/dL — ABNORMAL HIGH (ref 70–99)
Potassium: 3.3 mEq/L — ABNORMAL LOW (ref 3.5–5.1)

## 2012-08-11 LAB — GLUCOSE, CAPILLARY
Glucose-Capillary: 282 mg/dL — ABNORMAL HIGH (ref 70–99)
Glucose-Capillary: 252 mg/dL — ABNORMAL HIGH (ref 70–99)

## 2012-08-11 LAB — CBC
HCT: 32.3 % — ABNORMAL LOW (ref 36.0–46.0)
Hemoglobin: 10.8 g/dL — ABNORMAL LOW (ref 12.0–15.0)
MCHC: 33.4 g/dL (ref 30.0–36.0)
MCV: 93.1 fL (ref 78.0–100.0)
RBC: 3.47 MIL/uL — ABNORMAL LOW (ref 3.87–5.11)
RDW: 14.9 % (ref 11.5–15.5)

## 2012-08-11 LAB — PROTIME-INR: Prothrombin Time: 28.9 seconds — ABNORMAL HIGH (ref 11.6–15.2)

## 2012-08-11 MED ORDER — LIRAGLUTIDE 18 MG/3ML ~~LOC~~ SOLN: 12.0000 mg | Freq: Every morning | SUBCUTANEOUS | Status: DC

## 2012-08-11 MED ORDER — INSULIN GLARGINE 100 UNIT/ML ~~LOC~~ SOLN: 95.0000 [IU] | Freq: Two times a day (BID) | SUBCUTANEOUS | Status: DC

## 2012-08-11 MED ORDER — DOXYCYCLINE HYCLATE 100 MG PO TABS: 100.0000 mg | ORAL_TABLET | Freq: Two times a day (BID) | ORAL | Status: AC

## 2012-08-11 MED ORDER — WARFARIN SODIUM 5 MG PO TABS
5.0000 mg | ORAL_TABLET | Freq: Once | ORAL | Status: AC
  Administered 2012-08-11: 5 mg via ORAL
  Filled 2012-08-11: qty 1

## 2012-08-11 NOTE — Progress Notes (Signed)
Inpatient Diabetes Program Recommendations  AACE/ADA: New Consensus Statement on Inpatient Glycemic Control (2013)  Target Ranges:  Prepandial:   less than 140 mg/dL      Peak postprandial:   less than 180 mg/dL (1-2 hours)      Critically ill patients:  140 - 180 mg/dL  Results for [name], [name] (MRN [id number]) as of 08/11/2012 15:14  Ref. Range 08/10/2012 12:35 08/10/2012 17:17 08/10/2012 21:25 08/11/2012 08:15 08/11/2012 11:55  Glucose-Capillary Latest Range: 70-99 mg/dL 409 (H) 811 (H) 914 (H) 204 (H) 281 (H)   Inpatient Diabetes Program Recommendations Insulin - Meal Coverage: add Novolog 10 units TID with meals  Thank you  Piedad Climes BSN, RN,CDE Inpatient Diabetes Coordinator [phone] (team pager)

## 2012-08-11 NOTE — Progress Notes (Signed)
ANTICOAGULATION CONSULT NOTE - Follow Up Consult  Pharmacy Consult for Coumadin Indication: history of VTE  Allergies  Allergen Reactions  . Molds & Smuts Shortness Of Breath and Rash  . Nitroglycerin Er Nausea And Vomiting  . Accolate (Zafirlukast) Other (See Comments)    Reaction unknown  . Allopurinol     GI  . Amoxicillin-Pot Clavulanate     unknown  . Ciprofloxacin     GI problems  . Codeine     Heart problems  . Fexofenadine     Hurts joints  . Morphine     unknown  . Sulfa Drugs Cross Reactors Nausea And Vomiting    nausea  . Sulfonamide Derivatives     nausea    Patient Measurements: Height: 5\' 8"  (172.7 cm) Weight: 399 lb 0.5 oz (181 kg) IBW/kg (Calculated) : 63.9  Vital Signs: Temp: 98.2 F (36.8 C) (02/17 0846) Temp src: Oral (02/17 0846) BP: 123/67 mmHg (02/17 0846) Pulse Rate: 77 (02/17 0846)  Labs:  Recent Labs  08/08/12 1437  08/09/12 0500 08/10/12 0500 08/11/12 0505  HGB 12.5  --  11.2*  --  10.8*  HCT 36.0  --  33.5*  --  32.3*  PLT 113*  --  111*  --  119*  LABPROT  --   < > 25.3* 26.7* 28.9*  INR  --   < > 2.43* 2.62* 2.91*  CREATININE 0.85  --  1.03 1.21* 1.08  < > = values in this interval not displayed.  Estimated Creatinine Clearance: 108.9 ml/min (by C-G formula based on Cr of 1.08).   Medications:  Scheduled:  . acidophilus  1 capsule Oral Daily  . atorvastatin  10 mg Oral QHS  . bumetanide  2 mg Oral BID  . busPIRone  7.5 mg Oral BID  . colchicine  0.6 mg Oral Daily  . dicyclomine  20 mg Oral BID  . doxycycline  100 mg Oral Q12H  . DULoxetine  30 mg Oral BID  . febuxostat  80 mg Oral QHS  . fesoterodine  4 mg Oral Daily  . gabapentin  100 mg Oral QID  . guaiFENesin  600 mg Oral BID  . insulin aspart  0-20 Units Subcutaneous TID WC  . insulin glargine  95 Units Subcutaneous BID  . ipratropium  500 mcg Nebulization QID  . irbesartan  150 mg Oral Daily  . Liraglutide  7.8 mg Subcutaneous q morning - 10a  .  metolazone  5 mg Oral 3 times weekly  . multivitamin with minerals  1 tablet Oral Daily  . nebivolol  10 mg Oral Daily  . pantoprazole  40 mg Oral BID  . potassium chloride SA  20 mEq Oral TID  . primidone  25 mg Oral Daily  . primidone  75 mg Oral QHS  . topiramate  25 mg Oral Daily  . [COMPLETED] warfarin  7.5 mg Oral ONCE-1800  . Warfarin - Pharmacist Dosing Inpatient   Does not apply q1800  . [DISCONTINUED] insulin glargine  90 Units Subcutaneous BID  . [DISCONTINUED] piperacillin-tazobactam  3.375 g Intravenous Once  . [DISCONTINUED] piperacillin-tazobactam (ZOSYN)  IV  3.375 g Intravenous Q8H  . [DISCONTINUED] vancomycin  1,250 mg Intravenous Q12H    Assessment: 52 yr old female with a complicated past medical history presents to the ED with lower back pain, dysuria and fever. Pharmacy to dose Coumadin for history of clotting disorder and history of VTE. Patient's home dose is 5 mg on M,Tu, Th,  F and 7.5 mg on Wed, Sat, Sun. Her INR on admission was 3.01. Today's INR is 2.91.   Goal of Therapy:  INR 2-3 Monitor platelets by anticoagulation protocol: Yes   Plan:  - Coumadin 5mg  PO x1 - Daily INR

## 2012-08-11 NOTE — Consult Note (Signed)
WOC consult Note Reason for Consult: eval left foot wounds. Pt reports she lacerated her 3rd and 4th toes at home aprox 2 wks ago, requested follow up for wound care now that she has been admitted. Lacerations appear well approximated and closed, no active drainage, no associated erythema .  She has a caregiver at home that has been dressing this area, however I do not see any open wounds at this time in need of topical care. She is noted to have small bulla of the left pretibial area, with associated cellulitis.  Wound type: bulla 1x1, serous filled Periwound: circumferential erythema of the LLE  Dressing procedure/placement/frequency: silicone foam dressing added to cover the bulla, change every 3 days and PRN soilage.   Re consult if needed, will not follow at this time. Thanks  Kamarius Buckbee [employer], CWOCN [phone])

## 2012-08-11 NOTE — Discharge Summary (Signed)
Physician Discharge Summary  [name] ZOX:[id number] DOB: [birth year] DOA: 08/08/2012  PCP: Astrid Divine, MD  Admit date: 08/08/2012 Discharge date: 08/11/2012  Time spent: >30  minutes  Recommendations for Outpatient Follow-up:  -BMET to follow electrolytes and kidney function -Check LLE for resolution of cellulitis and healing process of her toes wounds -close follow up to her DM; meds slightly adjusted during hsopitalization for better control of her CBG's.  Discharge Diagnoses:  Principal Problem:   SIRS (systemic inflammatory response syndrome) Active Problems:   Morbid obesity   OBESITY HYPOVENTILATION SYNDROME   Splenomegaly   Diabetes mellitus type 2 in obese   GERD (gastroesophageal reflux disease)   Tracheostomy dependence   Urgency incontinence   Lactic acidosis   Leukocytosis   Discharge Condition: stable and improved. No CP, no SOB, no fever and her WBC's and lactic acid back to normal at discharge. Will be discharge home with instructions to finish antibiotics and follow discharge recommendations as prescribed. Will arrange follow up with PCP in 2 weeks.  Diet recommendation: low carbohydrates and low sodium diet  Filed Weights   08/09/12 0145 08/10/12 0440 08/11/12 0018  Weight: 193.5 kg (426 lb 9.4 oz) 196 kg (432 lb 1.6 oz) 181 kg (399 lb 0.5 oz)    History of present illness:  52 y.o. female with significant and complex pmh as mentioned below; came to ED complaining of about 2 days of abdominal/CVA tenderness, fever, urgency and dysuria. Patient also with some erythema affecting her pannus. She endorses some nausea but not vomiting. Denies CP, SOB, melena, hematemesis, hematochezia or any other acute complaints. In the ED found with SIRS (tachycardia, febrile, elevated WBC's); also with elevated lactic acid. Patient received IVF's in the ED, also had pan-cultures taken and started on empiric abx's. [facility] called to admit patient for further evaluation  and treatment.  Hospital Course:  1-SIRS (systemic inflammatory response syndrome): secondary to panniculitis on right side of her abdomen and also cellulitis of her left leg/toes..  -Patient lactic acid is WNL at discharge; same with her WBC's. No fever.  -CT abdomen and pelvis showing panniculitis; but not other acute abnormalities.  -will change antibiotic treatment to doxycyline for cellulitis and panniculitis; urine cx neg. No growth in her blood cx either.  -patient improving and getting better; will discharge home.   2-Morbid obesity: chronic; patient with BMI > 40 and > 410 pounds. Discussed about low calorie diet; patient contemplating bariatric surgery and diet.[name]   3-OBESITY HYPOVENTILATION SYNDROME: continue oxygen supplementation and BIPAP/ventilator support at bedtime through tracheostomy   4-Splenomegaly: with ongoing anemia and thrombocytopenia. Heparin products avoided during admission. Levels remains stable and currently at baseline. CT abdomen do not demonstrated any new abnormality other than spleen/liver enlargement.   5-Diabetes mellitus type 2 in obese: continue novolog meal coverage; lantus dose adjusted for better control and to simplify regimen. Will also continue victoza and low carb diet. A1C 8.9   6-GERD (gastroesophageal reflux disease): continue PPI   7-Chronic resp failure s/p Tracheostomy dependence: will continue QHS oxygen and BIPAP/ventilator use at home. Denies SOB   8-Urgency incontinence: due to presumed UTI. Culture w/o growth. Patient finished empiric symptomatic treatment with 3 days of IV zosyn; will now start tx with doxycycline as part of therapy for her cellulitis. Denies dysuria or urgency. Afebrile.   9-Lactic acidosis:due to ongoing SIRS/early sepsis; resolved with IVF's and abx's. Lactic acid WNL at discharge.    10-Leukocytosis: due to infection. Continue abx's as mentioned above.  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 INR therapeutic at discharge.  12-Hypokalemia and hypomagnesemia: Repleted and WNL at discharge. Will continue daily maintenance supplementation for her potassium.  13-Anxiety: continue buspar and PRN Xanax. Stable.   14- acute renal insufficiency: probably with dehydration and use of vanc. After IVF's given and Vancomycin discontinue; patient renal function returns to normal. BMET as an outpatient during follow up.   Rest of medical problems remains stable; will continue home medications regimen and patient will follow with PCP for further medication adjustments.  Procedures:  See below fro x-ray reports  Consultations:  WOC  Discharge Exam: Filed Vitals:   08/11/12 0407 08/11/12 0536 08/11/12 0846 08/11/12 0920  BP:  113/62 123/67 123/67  Pulse: 72 73 77 82  Temp:  97.8 F (36.6 C) 98.2 F (36.8 C)   TempSrc:  Oral Oral   Resp: 24 21 17 20   Height:      Weight:      SpO2: 99% 100% 99% 100%   General: Afebrile; feeling better; NAD  Cardiovascular: mild tachycardia, no rubs or gallops  Respiratory: CTA  Abdomen: obese, positive distant BS, no tenderness to palpation  Extremities: with 2++ edema bilateral; LLE with improving erythematous changes and also with wound affecting her third and fourth toes; no drainage or with  significant sings of infection today.. Neuro" non focal.    Discharge Instructions  Discharge Orders   Future Appointments Provider Department Dept Phone   08/18/2012 9:15 AM [id number] [address] [phone]   Patient to arrive 15 minutes prior to appointment time.   10/06/2012 10:00 AM Delcie Roch [facility] [phone]   10/06/2012 10:30 AM Victorino December, MD [facility] [phone]   Future Orders Complete By Expires     Diet - low sodium heart healthy  As directed     Discharge instructions  As directed     Comments:      Take  medications as prescribed Keep left leg elevated as much as possible Keep yourself hydrated Follow a low sodium and low carb diet Arrange follow up with PCP in 7-10 days        Medication List    STOP taking these medications       HYDROcodone-acetaminophen 5-325 MG per tablet  Commonly known as:  NORCO/VICODIN      TAKE these medications       acetaminophen 500 MG tablet  Commonly known as:  TYLENOL  Take 1,000 mg by mouth every 6 (six) hours as needed. For pain     albuterol (2.5 MG/3ML) 0.083% nebulizer solution  Commonly known as:  PROVENTIL  Take 2.5 mg by nebulization every 4 (four) hours as needed. For shortness of breath     ALPRAZolam 0.5 MG tablet  Commonly known as:  XANAX  Take 0.5 mg by mouth 2 (two) times daily as needed. For anxiety     ANTIVERT 25 MG tablet  Generic drug:  meclizine  Take 25 mg by mouth daily.     atorvastatin 10 MG tablet  Commonly known as:  LIPITOR  Take 10 mg by mouth at bedtime.     BENTYL 20 MG tablet  Generic drug:  dicyclomine  Take 20 mg by mouth 2 (two) times daily.     budesonide 0.25 MG/2ML nebulizer solution  Commonly known as:  PULMICORT  Take 0.25 mg  by nebulization 3 (three) times daily as needed. For shortness of breath.     bumetanide 1 MG tablet  Commonly known as:  BUMEX  Take 2 mg by mouth 2 (two) times daily.     busPIRone 5 MG tablet  Commonly known as:  BUSPAR  Take 7.5 mg by mouth 2 (two) times daily. \     BYSTOLIC 10 MG tablet  Generic drug:  nebivolol  Take 10 mg by mouth daily.     cholestyramine 4 G packet  Commonly known as:  QUESTRAN  Take 1 packet by mouth as needed. For loose stools, IBS.     clotrimazole 1 % cream  Commonly known as:  LOTRIMIN  Apply 1 application topically daily. To stomach     colchicine 0.6 MG tablet  Take 0.6 mg by mouth daily.     doxycycline 100 MG tablet  Commonly known as:  VIBRA-TABS  Take 1 tablet (100 mg total) by mouth 2 (two) times daily.      DULoxetine 30 MG capsule  Commonly known as:  CYMBALTA  Take 30 mg by mouth 2 (two) times daily.     ergocalciferol 50000 UNITS capsule  Commonly known as:  VITAMIN D2  Take 50,000 Units by mouth once a week. Take on Fridays     fish oil-omega-3 fatty acids 1000 MG capsule  Take 1 g by mouth 2 (two) times daily.     gabapentin 100 MG capsule  Commonly known as:  NEURONTIN  Take 100 mg by mouth 4 (four) times daily.     guaiFENesin 600 MG 12 hr tablet  Commonly known as:  MUCINEX  Take 600 mg by mouth 2 (two) times daily.     HYDROcodone-acetaminophen 5-500 MG per tablet  Commonly known as:  VICODIN  Take 1 tablet by mouth every 6 (six) hours as needed. For pain     insulin aspart 100 UNIT/ML injection  Commonly known as:  novoLOG  Inject 16 Units into the skin 3 (three) times daily before meals.     insulin glargine 100 UNIT/ML injection  Commonly known as:  LANTUS  Inject 95 Units into the skin 2 (two) times daily. Sliding scale     ipratropium 0.02 % nebulizer solution  Commonly known as:  ATROVENT  Take 2.5 mLs (500 mcg total) by nebulization 4 (four) times daily.     Liraglutide 18 MG/3ML Soln injection  Commonly known as:  VICTOZA  Inject 2 mLs (12 mg total) into the skin every morning.     loratadine 10 MG tablet  Commonly known as:  CLARITIN  Take 10 mg by mouth daily as needed. For allergies     metolazone 5 MG tablet  Commonly known as:  ZAROXOLYN  Take 5 mg by mouth 3 (three) times a week.     multivitamin capsule  Take 1 capsule by mouth daily.     omeprazole 40 MG capsule  Commonly known as:  PRILOSEC  Take 40 mg by mouth 2 (two) times daily.     polyethylene glycol packet  Commonly known as:  MIRALAX / GLYCOLAX  Take 17 g by mouth daily as needed (for constipation). For constipation.     potassium chloride SA 20 MEQ tablet  Commonly known as:  K-DUR,KLOR-CON  Take 20 mEq by mouth 3 (three) times daily.     primidone 50 MG tablet  Commonly  known as:  MYSOLINE  Take 25-75 mg by mouth 2 (two) times daily.  Take 25 MG in the morning and 75 MG at bedtime.     PROBIOTIC Caps  Take 1 capsule by mouth daily.     promethazine 25 MG tablet  Commonly known as:  PHENERGAN  Take 25 mg by mouth every 6 (six) hours as needed. For nausea/vomiting     senna-docusate 8.6-50 MG per tablet  Commonly known as:  Senokot-S  Take 1 tablet by mouth daily as needed. For constipation     topiramate 25 MG tablet  Commonly known as:  TOPAMAX  Take 1 tablet (25 mg total) by mouth daily.     TOVIAZ 4 MG Tb24  Generic drug:  fesoterodine  Take 4 mg by mouth daily.     ULORIC 80 MG Tabs  Generic drug:  Febuxostat  Take 80 mg by mouth at bedtime.     valsartan 160 MG tablet  Commonly known as:  DIOVAN  Take 160 mg by mouth daily.     VITAMIN B COMPLEX PO  Take 1 tablet by mouth daily.     warfarin 5 MG tablet  Commonly known as:  COUMADIN  Take 5-7.5 mg by mouth daily. Take 1 tablet (5mg ) on Monday, Tuesday, Thursday, Friday.    Take 1.5 tablet (7.5mg ) on Sunday, Wednesday, and saturday           Follow-up Information   Follow up with Astrid Divine, MD. Schedule an appointment as soon as possible for a visit in 10 days.   Contact information:   [address], [address] [place] [address] [phone]        The results of significant diagnostics from this hospitalization (including imaging, microbiology, ancillary and laboratory) are listed below for reference.    Significant Diagnostic Studies: Ct Abdomen Pelvis W Contrast  08/08/2012  *RADIOLOGY REPORT*  Clinical Data: Short of breath.  Splenomegaly. Abdominal pain.  CT ABDOMEN AND PELVIS WITH CONTRAST  Technique:  Multidetector CT imaging of the abdomen and pelvis was performed following the standard protocol during bolus administration of intravenous contrast.  Contrast: OMNIPAQUE IOHEXOL 300 MG/ML  SOLN  Comparison: 12/23/2010 CT.  Findings: Lung  Bases: Collateral flow is identified associated with contrast injection in the left chest wall, suggesting stenosis of the left upper extremity venous return.  Dependent atelectasis is present at the lung bases.  Liver:  Diffuse low attenuation compatible with hepatic steatosis. Hepatomegaly is present with a 25 cm liver span.  Portal vein appears patent.  Spleen:  Splenomegaly with 23 cm span.  The tip of the spleen reaches the level of the iliac crest.  No focal mass lesions.  Gallbladder:  Cholecystectomy.  Common bile duct:  Normal appearance of the common bile duct.  Pancreas:  Normal.  Adrenal glands:  Normal.  Kidneys:  Nonspecific bilateral perinephric stranding.  Normal enhancement and excretion of contrast.  The ureters appear within normal limits.  Stomach:  Mostly decompressed.  No mass lesions or inflammatory changes.  Small bowel:  No obstruction.  No mesenteric adenopathy.  No free fluid.  Colon:   Rectum is decompressed.  Sigmoid colon is within normal limits.  Normal appendix.  Pelvic Genitourinary:  Physiologic appearance of the uterus and adnexa.  29 mm right adnexal cyst with benign features.  Bones:  There are no aggressive osseous lesions.  Lumbar spondylosis and facet arthrosis.  Vasculature: Atherosclerosis.  No aneurysm.  There is marked laxity of abdominal wall.  The patient's pannus shows inflammatory changes suggesting panniculitis.  Portions  of the right flank contact the detector tube, producing artifact.  IMPRESSION: 1.  Hepatosplenomegaly with low attenuation of the liver suggesting hepatic steatosis.  Portal vein is patent.  Cause of Hepatosplenomegaly is unclear. 2.  No acute abdominal findings. 3.   Nonspecific stranding in the patient's pannus may represent panniculitis.   Original Report Authenticated By: Andreas Newport, [name]    Dg Chest Portable 1 View  08/08/2012  *RADIOLOGY REPORT*  Clinical Data: Shortness of breath, cough and fever.  PORTABLE CHEST - 1 VIEW  Comparison:  PA and lateral chest 03/24/2012 and portable chest 01/01/2012.  Findings: Lungs are clear.  Heart size is mildly enlarged.  No pneumothorax or pleural fluid.  Tracheostomy tube noted.  IMPRESSION: No acute disease.   Original Report Authenticated By: Holley Dexter, [name]    Dg Foot Complete Left  07/27/2012  *RADIOLOGY REPORT*  Clinical Data: Post fall, now with left foot pain, primarily within the third to the fifth metatarsals.  LEFT FOOT - COMPLETE 3+ VIEW  Comparison: None.  Findings:  The AP radiograph is degraded secondary to obliquity.  No definite fracture or dislocation.  There is a mild hallux valgus deformity without associated degenerative change.  Mild degenerative change of several mid foot articulations.  No definite erosions.  Small plantar calcaneal spur.  Minimal enthesopathic change of Achilles tendon insertion site.  Dermal calcifications about the lower leg.  IMPRESSION: 1.  No definite fracture or dislocation. 2.  Mild hallux valgus deformity without associated degenerative change. 3.  Small plantar calcaneal spur.   Original Report Authenticated By: Tacey Ruiz, MD     Microbiology: Recent Results (from the past 240 hour(s))  CULTURE, BLOOD (ROUTINE X 2)     Status: None   Collection Time    08/08/12  2:55 PM      Result Value Range Status   Specimen Description BLOOD ARM RIGHT   Final   Special Requests BOTTLES DRAWN AEROBIC ONLY 10CC   Final   Culture  Setup Time 08/08/2012 20:05   Final   Culture     Final   Value:        BLOOD CULTURE RECEIVED NO GROWTH TO DATE CULTURE WILL BE HELD FOR 5 DAYS BEFORE ISSUING A FINAL NEGATIVE REPORT   Report Status PENDING   Incomplete  CULTURE, BLOOD (ROUTINE X 2)     Status: None   Collection Time    08/08/12  3:00 PM      Result Value Range Status   Specimen Description BLOOD ARM LEFT   Final   Special Requests BOTTLES DRAWN AEROBIC ONLY 10CC   Final   Culture  Setup Time 08/08/2012 20:05   Final   Culture     Final   Value:         BLOOD CULTURE RECEIVED NO GROWTH TO DATE CULTURE WILL BE HELD FOR 5 DAYS BEFORE ISSUING A FINAL NEGATIVE REPORT   Report Status PENDING   Incomplete  URINE CULTURE     Status: None   Collection Time    08/08/12  3:18 PM      Result Value Range Status   Specimen Description URINE, CATHETERIZED   Final   Special Requests ADDED AT 1548 ON 161096   Final   Culture  Setup Time 08/08/2012 18:30   Final   Colony Count NO GROWTH   Final   Culture NO GROWTH   Final   Report Status 08/09/2012 FINAL   Final  MRSA PCR  SCREENING     Status: None   Collection Time    08/09/12 12:05 AM      Result Value Range Status   MRSA by PCR NEGATIVE  NEGATIVE Final   Comment:            The GeneXpert MRSA Assay (FDA     approved for NASAL specimens     only), is one component of a     comprehensive MRSA colonization     surveillance program. It is not     intended to diagnose MRSA     infection nor to guide or     monitor treatment for     MRSA infections.  CLOSTRIDIUM DIFFICILE BY PCR     Status: None   Collection Time    08/09/12  1:33 PM      Result Value Range Status   C difficile by pcr NEGATIVE  NEGATIVE Final     Labs: Basic Metabolic Panel:  Recent Labs Lab 08/08/12 1437 08/08/12 2330 08/09/12 0500 08/10/12 0500 08/11/12 0505  NA 137  --  136 136 135  K 2.9*  --  2.9* 3.8 3.3*  CL 92*  --  93* 95* 95*  CO2 29  --  30 26 26   GLUCOSE 298*  --  234* 229* 223*  BUN 20  --  23 27* 26*  CREATININE 0.85  --  1.03 1.21* 1.08  CALCIUM 8.6  --  8.2* 8.0* 7.8*  MG  --  1.3*  --  1.5  --   PHOS  --  3.6  --   --   --    Liver Function Tests:  Recent Labs Lab 08/08/12 2330  AST 40*  ALT 36*  ALKPHOS 47  BILITOT 0.3  PROT 7.4  ALBUMIN 3.1*   CBC:  Recent Labs Lab 08/08/12 1437 08/09/12 0500 08/11/12 0505  WBC 15.2* 12.4* 7.2  HGB 12.5 11.2* 10.8*  HCT 36.0 33.5* 32.3*  MCV 91.6 93.3 93.1  PLT 113* 111* 119*   CBG:  Recent Labs Lab 08/10/12 0808 08/10/12 1235  08/10/12 1717 08/10/12 2125 08/11/12 0815  GLUCAP 254* 273* 270* 282* 204*    Signed:  Ryler Laskowski  [facility] 08/11/2012, 9:51 AM

## 2012-08-14 LAB — CULTURE, BLOOD (ROUTINE X 2)
Culture: NO GROWTH
Culture: NO GROWTH

## 2012-08-18 ENCOUNTER — Ambulatory Visit
Admission: RE | Admit: 2012-08-18 | Discharge: 2012-08-18 | Disposition: A | Discharge status: Home / Self Care | Payer: Medicare Other | Source: Ambulatory Visit | Attending: Neurology | Admitting: Neurology

## 2012-08-18 DIAGNOSIS — M542 Cervicalgia: Secondary | ICD-10-CM

## 2012-08-18 MED ORDER — GADOBENATE DIMEGLUMINE 529 MG/ML IV SOLN
20.0000 mL | Freq: Once | INTRAVENOUS | Status: AC | PRN
  Administered 2012-08-18: 20 mL via INTRAVENOUS

## 2012-09-08 ENCOUNTER — Encounter (HOSPITAL_BASED_OUTPATIENT_CLINIC_OR_DEPARTMENT_OTHER): Payer: Medicare Other | Attending: General Surgery

## 2012-09-08 DIAGNOSIS — G609 Hereditary and idiopathic neuropathy, unspecified: Secondary | ICD-10-CM | POA: Insufficient documentation

## 2012-09-08 DIAGNOSIS — Z7901 Long term (current) use of anticoagulants: Secondary | ICD-10-CM | POA: Insufficient documentation

## 2012-09-08 DIAGNOSIS — E119 Type 2 diabetes mellitus without complications: Secondary | ICD-10-CM | POA: Insufficient documentation

## 2012-09-08 DIAGNOSIS — G4730 Sleep apnea, unspecified: Secondary | ICD-10-CM | POA: Insufficient documentation

## 2012-09-08 DIAGNOSIS — M1990 Unspecified osteoarthritis, unspecified site: Secondary | ICD-10-CM | POA: Insufficient documentation

## 2012-09-08 DIAGNOSIS — L97509 Non-pressure chronic ulcer of other part of unspecified foot with unspecified severity: Secondary | ICD-10-CM | POA: Insufficient documentation

## 2012-09-08 DIAGNOSIS — E1169 Type 2 diabetes mellitus with other specified complication: Secondary | ICD-10-CM | POA: Insufficient documentation

## 2012-09-08 DIAGNOSIS — M109 Gout, unspecified: Secondary | ICD-10-CM | POA: Insufficient documentation

## 2012-09-08 DIAGNOSIS — E669 Obesity, unspecified: Secondary | ICD-10-CM | POA: Insufficient documentation

## 2012-09-08 DIAGNOSIS — I82409 Acute embolism and thrombosis of unspecified deep veins of unspecified lower extremity: Secondary | ICD-10-CM | POA: Insufficient documentation

## 2012-09-08 DIAGNOSIS — Z794 Long term (current) use of insulin: Secondary | ICD-10-CM | POA: Insufficient documentation

## 2012-09-08 LAB — GLUCOSE, CAPILLARY: Glucose-Capillary: 250 mg/dL — ABNORMAL HIGH (ref 70–99)

## 2012-09-09 NOTE — Progress Notes (Signed)
[facility]  NAME:  LAVONIA, EAGER NO.:  [id number]  MEDICAL RECORD NO.:  [id number]      DATE OF BIRTH:  1961/04/10  PHYSICIAN:  Ardath Sax, [name]           VISIT DATE:                                  OFFICE VISIT   [name] is a 52 year old female who was kindly sent here by Dr. Maurice Small, because of chronic blisters on her 3rd, 4th, and 5th toes of the left foot, and a blister on the anterior aspect of her left leg.  This lady has many medical problems.  The most significant is that she weighs over 400 pounds.  She is 5 feet 8 inches and she is diabetic and has peripheral neuropathy.  She takes 95 units of Lantus insulin twice a day.  She says she is attempting to lose weight, but has been unsuccessful.  She has terrible pulmonary problems and has a permanent tracheostomy because of her COPD and sleep apnea.  She has high blood pressure and takes Zaroxolyn 5 mg 3 times a week.  She takes Bumex 2 mg twice a day, MiraLAX, Mucinex, loratadine 10 mg p.r.n., Topamax 50 mg at bedtime, albuterol inhaler 3 times a day, Mysoline 50 mg a day, vitamins, and she also takes vitamin D every day.  She takes Xanax for anxiety, promethazine for nausea.  She takes hydrocodone for pain.  She is also on warfarin 5 mg every day, apparently because of history of phlebitis in the past.  Her diagnoses other than diabetes and obesity and sleep apnea are osteoarthritis, peripheral neuropathy, gout and chronic deep venous thrombosis.  She today her blood pressure is 119/74, respirations are 23, pulse 73, temperature 98.6.  On examination, I saw blisters and ulcerations on the dorsal aspect and the volar aspect of the 3rd, 4th, and 5th toes on the left foot.  She also had blisters on the anterior aspect of her left leg.  These, I am classifying as [address] diabetic foot ulcers, and after I debrided them, I am treating her with silver alginate dressings  twice a week, and will have her come back here in a week.     Ardath Sax, [name]     PP/MEDQ  D:  09/08/2012  T:  09/09/2012  Job:  [id number]

## 2012-09-23 ENCOUNTER — Telehealth: Payer: Self-pay | Admitting: *Deleted

## 2012-09-23 NOTE — Telephone Encounter (Signed)
sw pt gv appt d/t for 12/19/12 @9pm . Pt is aware of these changes.

## 2012-10-01 ENCOUNTER — Encounter (HOSPITAL_BASED_OUTPATIENT_CLINIC_OR_DEPARTMENT_OTHER): Payer: Medicare Other | Attending: General Surgery

## 2012-10-01 DIAGNOSIS — Z794 Long term (current) use of insulin: Secondary | ICD-10-CM | POA: Insufficient documentation

## 2012-10-01 DIAGNOSIS — L97509 Non-pressure chronic ulcer of other part of unspecified foot with unspecified severity: Secondary | ICD-10-CM | POA: Insufficient documentation

## 2012-10-01 DIAGNOSIS — E1169 Type 2 diabetes mellitus with other specified complication: Secondary | ICD-10-CM | POA: Insufficient documentation

## 2012-10-06 ENCOUNTER — Other Ambulatory Visit: Payer: Medicare Other | Admitting: Lab

## 2012-10-06 ENCOUNTER — Ambulatory Visit: Payer: Medicare Other | Admitting: Oncology

## 2012-10-13 ENCOUNTER — Encounter (HOSPITAL_COMMUNITY): Payer: Self-pay | Admitting: Dietician

## 2012-10-13 ENCOUNTER — Encounter (HOSPITAL_BASED_OUTPATIENT_CLINIC_OR_DEPARTMENT_OTHER): Payer: Self-pay | Admitting: *Deleted

## 2012-10-15 ENCOUNTER — Encounter (HOSPITAL_COMMUNITY): Payer: Self-pay | Admitting: Nurse Practitioner

## 2012-10-15 ENCOUNTER — Emergency Department (HOSPITAL_COMMUNITY)
Admission: EM | Admit: 2012-10-15 | Discharge: 2012-10-16 | Disposition: A | Discharge status: Home / Self Care | Payer: Medicare Other | Attending: Emergency Medicine | Admitting: Emergency Medicine

## 2012-10-15 DIAGNOSIS — S91109A Unspecified open wound of unspecified toe(s) without damage to nail, initial encounter: Secondary | ICD-10-CM | POA: Insufficient documentation

## 2012-10-15 DIAGNOSIS — E1129 Type 2 diabetes mellitus with other diabetic kidney complication: Secondary | ICD-10-CM | POA: Insufficient documentation

## 2012-10-15 DIAGNOSIS — Z8739 Personal history of other diseases of the musculoskeletal system and connective tissue: Secondary | ICD-10-CM | POA: Insufficient documentation

## 2012-10-15 DIAGNOSIS — Z794 Long term (current) use of insulin: Secondary | ICD-10-CM | POA: Insufficient documentation

## 2012-10-15 DIAGNOSIS — F411 Generalized anxiety disorder: Secondary | ICD-10-CM | POA: Insufficient documentation

## 2012-10-15 DIAGNOSIS — S91119A Laceration without foreign body of unspecified toe without damage to nail, initial encounter: Secondary | ICD-10-CM

## 2012-10-15 DIAGNOSIS — K219 Gastro-esophageal reflux disease without esophagitis: Secondary | ICD-10-CM | POA: Insufficient documentation

## 2012-10-15 DIAGNOSIS — W010XXA Fall on same level from slipping, tripping and stumbling without subsequent striking against object, initial encounter: Secondary | ICD-10-CM | POA: Insufficient documentation

## 2012-10-15 DIAGNOSIS — Z8742 Personal history of other diseases of the female genital tract: Secondary | ICD-10-CM | POA: Insufficient documentation

## 2012-10-15 DIAGNOSIS — E1142 Type 2 diabetes mellitus with diabetic polyneuropathy: Secondary | ICD-10-CM | POA: Insufficient documentation

## 2012-10-15 DIAGNOSIS — Z7901 Long term (current) use of anticoagulants: Secondary | ICD-10-CM | POA: Insufficient documentation

## 2012-10-15 DIAGNOSIS — F3289 Other specified depressive episodes: Secondary | ICD-10-CM | POA: Insufficient documentation

## 2012-10-15 DIAGNOSIS — S92302A Fracture of unspecified metatarsal bone(s), left foot, initial encounter for closed fracture: Secondary | ICD-10-CM

## 2012-10-15 DIAGNOSIS — Z8701 Personal history of pneumonia (recurrent): Secondary | ICD-10-CM | POA: Insufficient documentation

## 2012-10-15 DIAGNOSIS — Z8719 Personal history of other diseases of the digestive system: Secondary | ICD-10-CM | POA: Insufficient documentation

## 2012-10-15 DIAGNOSIS — Y929 Unspecified place or not applicable: Secondary | ICD-10-CM | POA: Insufficient documentation

## 2012-10-15 DIAGNOSIS — G909 Disorder of the autonomic nervous system, unspecified: Secondary | ICD-10-CM | POA: Insufficient documentation

## 2012-10-15 DIAGNOSIS — Y9389 Activity, other specified: Secondary | ICD-10-CM | POA: Insufficient documentation

## 2012-10-15 DIAGNOSIS — S92309A Fracture of unspecified metatarsal bone(s), unspecified foot, initial encounter for closed fracture: Secondary | ICD-10-CM | POA: Insufficient documentation

## 2012-10-15 DIAGNOSIS — Z79899 Other long term (current) drug therapy: Secondary | ICD-10-CM | POA: Insufficient documentation

## 2012-10-15 DIAGNOSIS — Z87448 Personal history of other diseases of urinary system: Secondary | ICD-10-CM | POA: Insufficient documentation

## 2012-10-15 DIAGNOSIS — J45909 Unspecified asthma, uncomplicated: Secondary | ICD-10-CM | POA: Insufficient documentation

## 2012-10-15 DIAGNOSIS — E1149 Type 2 diabetes mellitus with other diabetic neurological complication: Secondary | ICD-10-CM | POA: Insufficient documentation

## 2012-10-15 DIAGNOSIS — E785 Hyperlipidemia, unspecified: Secondary | ICD-10-CM | POA: Insufficient documentation

## 2012-10-15 DIAGNOSIS — Z8639 Personal history of other endocrine, nutritional and metabolic disease: Secondary | ICD-10-CM | POA: Insufficient documentation

## 2012-10-15 DIAGNOSIS — Z8679 Personal history of other diseases of the circulatory system: Secondary | ICD-10-CM | POA: Insufficient documentation

## 2012-10-15 DIAGNOSIS — Z8669 Personal history of other diseases of the nervous system and sense organs: Secondary | ICD-10-CM | POA: Insufficient documentation

## 2012-10-15 DIAGNOSIS — I509 Heart failure, unspecified: Secondary | ICD-10-CM | POA: Insufficient documentation

## 2012-10-15 DIAGNOSIS — Z8619 Personal history of other infectious and parasitic diseases: Secondary | ICD-10-CM | POA: Insufficient documentation

## 2012-10-15 DIAGNOSIS — N189 Chronic kidney disease, unspecified: Secondary | ICD-10-CM | POA: Insufficient documentation

## 2012-10-15 DIAGNOSIS — Z862 Personal history of diseases of the blood and blood-forming organs and certain disorders involving the immune mechanism: Secondary | ICD-10-CM | POA: Insufficient documentation

## 2012-10-15 DIAGNOSIS — F329 Major depressive disorder, single episode, unspecified: Secondary | ICD-10-CM | POA: Insufficient documentation

## 2012-10-15 NOTE — ED Provider Notes (Signed)
History     CSN: [id number]  Arrival date & time 10/15/12  1478   First MD Initiated Contact with Patient 10/15/12 2000      Chief Complaint  Patient presents with  . Fall    (Consider location/radiation/quality/duration/timing/severity/associated sxs/prior treatment) HPI Patient presents to the emergency department with lacerations to her fourth and fifth toes after she stumbled while using her walker.  Patient, states, that she is unsure what her foot hit against.  Patient, states she had a similar episode in the past. The patient denies head injury, weakness, syncope, chest pain,dizziness, vomiting, nausea, shortness of breath, and abdominal pain. Patient states that the laceration came from a sharp object. Past Medical History  Diagnosis Date  . OSA (obstructive sleep apnea)   . Morbid obesity   . Depressive disorder, not elsewhere classified   . Diabetes mellitus   . Asthma   . Dyslipidemia   . Clostridium difficile enterocolitis 2012  . Splenomegaly 06/01/2011  . Thrombocytopenia 06/01/2011  . Hepatosplenomegaly   . Gout   . GERD (gastroesophageal reflux disease)   . Osteoarthritis   . Polyneuropathy   . Urinary incontinence   . Amenorrhea   . CHF (congestive heart failure)   . Hyperlipidemia   . Leg swelling   . Nausea   . Weakness   . Palpitations   . PONV (postoperative nausea and vomiting)   . Anginal pain     no chest pain in years  . Dysrhythmia     heart palpations  . Anxiety   . Pneumonia   . Shortness of breath   . Chronic kidney disease     overactive bladder  . Peripheral neuropathy     Past Surgical History  Procedure Laterality Date  . Cholecystectomy    . Tracheostomy    . Endometrial ablation    . Skin graft    . Finger surgery      ring finger on right hand  . Esophageal dilation  2003  . Breast biopsy      needle core, left  . Dilation and curettage of uterus      times 2  . Tracheal dilitation  01/24/2012    Procedure: TRACHEAL  DILITATION;  Surgeon: [name], MD;  Location: [facility] OR;  Service: ENT;  Laterality: N/A;  Trache change with possible dilation, from size 6 to size 7 uncuffed    Family History  Problem Relation Age of Onset  . Diabetes Father   . Hypertension Father   . Dementia Father   . Pneumonia Father   . Heart disease Mother   . Clotting disorder Mother   . Hypertension Mother   . Heart attack Mother   . Multiple myeloma Paternal Grandmother   . Cancer Paternal Grandmother     multiple myoloma  . Heart disease Paternal Grandfather   . Kidney disease Maternal Grandmother   . Hypertension Sister   . Cancer Paternal Aunt     breast  . Cancer Cousin     breast - all three    History  Substance Use Topics  . Smoking status: Never Smoker   . Smokeless tobacco: Never Used  . Alcohol Use: No     Comment: occasional - once or twice a year    OB History   Grav Para Term Preterm Abortions TAB SAB Ect Mult Living   0               Review of Systems All other  systems negative except as documented in the HPI. All pertinent positives and negatives as reviewed in the HPI.  Allergies  Molds & smuts; Nitroglycerin er; Accolate; Allopurinol; Amoxicillin-pot clavulanate; Ciprofloxacin; Codeine; Doxycycline; Fexofenadine; Morphine; Sulfa drugs cross reactors; and Sulfonamide derivatives  Home Medications   Current Outpatient Rx  Name  Route  Sig  Dispense  Refill  . acetaminophen (TYLENOL) 500 MG tablet   Oral   Take 1,000 mg by mouth every 6 (six) hours as needed. For pain         . albuterol (PROVENTIL) (2.5 MG/3ML) 0.083% nebulizer solution   Nebulization   Take 2.5 mg by nebulization every 4 (four) hours as needed. For shortness of breath         . ALPRAZolam (XANAX) 0.5 MG tablet   Oral   Take 0.5 mg by mouth 2 (two) times daily as needed. For anxiety         . atorvastatin (LIPITOR) 10 MG tablet   Oral   Take 10 mg by mouth at bedtime.          . B Complex Vitamins  (VITAMIN B COMPLEX PO)   Oral   Take 1 tablet by mouth daily.          . budesonide (PULMICORT) 0.25 MG/2ML nebulizer solution   Nebulization   Take 0.25 mg by nebulization 3 (three) times daily as needed. For shortness of breath.         . bumetanide (BUMEX) 1 MG tablet   Oral   Take 2 mg by mouth 2 (two) times daily.          . busPIRone (BUSPAR) 5 MG tablet   Oral   Take 7.5 mg by mouth 2 (two) times daily.          . Canagliflozin (INVOKANA) 100 MG TABS   Oral   Take 1 tablet by mouth daily before breakfast.         . cholestyramine (QUESTRAN) 4 G packet   Oral   Take 1 packet by mouth as needed. For loose stools, IBS.         . clotrimazole (LOTRIMIN) 1 % cream   Topical   Apply 1 application topically daily. To stomach         . colchicine 0.6 MG tablet   Oral   Take 0.6 mg by mouth daily.         [name] dicyclomine (BENTYL) 20 MG tablet   Oral   Take 20 mg by mouth 2 (two) times daily.           . DULoxetine (CYMBALTA) 30 MG capsule   Oral   Take 30 mg by mouth 2 (two) times daily.           . ergocalciferol (VITAMIN D2) 50000 UNITS capsule   Oral   Take 50,000 Units by mouth once a week. Take on Fridays         . Febuxostat (ULORIC) 80 MG TABS   Oral   Take 80 mg by mouth at bedtime.          . fesoterodine (TOVIAZ) 4 MG TB24   Oral   Take 4 mg by mouth daily.           . fish oil-omega-3 fatty acids 1000 MG capsule   Oral   Take 1 g by mouth 2 (two) times daily.          [name] gabapentin (NEURONTIN) 100 MG capsule  Oral   Take 100 mg by mouth 4 (four) times daily.          [name] guaiFENesin (MUCINEX) 600 MG 12 hr tablet   Oral   Take 600 mg by mouth 2 (two) times daily.          [name] HYDROcodone-acetaminophen (VICODIN) 5-500 MG per tablet   Oral   Take 1 tablet by mouth every 6 (six) hours as needed. For pain         . insulin regular human CONCENTRATED (HUMULIN R) 500 UNIT/ML SOLN injection   Subcutaneous   Inject 20  Units into the skin 2 (two) times daily before a meal.         . ipratropium (ATROVENT) 0.02 % nebulizer solution   Nebulization   Take 2.5 mLs (500 mcg total) by nebulization 4 (four) times daily.   75 mL   12   . Liraglutide (VICTOZA) 18 MG/3ML SOLN injection   Subcutaneous   Inject 1.8 mg into the skin daily.         [name] loratadine (CLARITIN) 10 MG tablet   Oral   Take 10 mg by mouth daily as needed. For allergies         . losartan (COZAAR) 100 MG tablet   Oral   Take 100 mg by mouth daily.         . meclizine (ANTIVERT) 25 MG tablet   Oral   Take 25 mg by mouth at bedtime.          . metolazone (ZAROXOLYN) 5 MG tablet   Oral   Take 5 mg by mouth 3 (three) times a week.          . Multiple Vitamin (MULTIVITAMIN) capsule   Oral   Take 1 capsule by mouth daily.           . nebivolol (BYSTOLIC) 10 MG tablet   Oral   Take 10 mg by mouth daily.           [name] omeprazole (PRILOSEC) 40 MG capsule   Oral   Take 40 mg by mouth 2 (two) times daily.          [name] OVER THE COUNTER MEDICATION   Apply externally   Apply 1 application topically daily. Anti fungal medication OTC         . potassium chloride SA (K-DUR,KLOR-CON) 20 MEQ tablet   Oral   Take 20 mEq by mouth 3 (three) times daily.          . primidone (MYSOLINE) 50 MG tablet   Oral   Take 25-75 mg by mouth 2 (two) times daily. Take 25 MG in the morning and 75 MG at bedtime.         [name] PROBIOTIC CAPS   Oral   Take 1 capsule by mouth daily.          . promethazine (PHENERGAN) 25 MG tablet   Oral   Take 25 mg by mouth every 6 (six) hours as needed. For nausea/vomiting         . topiramate (TOPAMAX) 25 MG tablet   Oral   Take 1 tablet (25 mg total) by mouth daily.         [name] warfarin (COUMADIN) 5 MG tablet   Oral   Take 5-7.5 mg by mouth daily. Take 1 tablet (5mg ) on Monday, Tuesday, Thursday, Friday.   Take 1.5 tablet (7.5mg ) on Sunday, Wednesday, and saturday  BP  117/67  Pulse 84  Temp(Src) 97.8 F (36.6 C) (Oral)  Resp 20  Ht 5\' 3"  (1.6 m)  Wt 424 lb (192.325 kg)  BMI 75.13 kg/m2  SpO2 95%  Physical Exam  Nursing note and vitals reviewed. Constitutional: She is oriented to person, place, and time. She appears well-developed and well-nourished. No distress.  HENT:  Head: Normocephalic and atraumatic.  Neck: Normal range of motion. Neck supple.  Cardiovascular: Normal rate, regular rhythm and normal heart sounds.  Exam reveals no gallop and no friction rub.   No murmur heard. Pulmonary/Chest: Effort normal and breath sounds normal.  Musculoskeletal:       Left foot: She exhibits laceration. She exhibits normal range of motion, no tenderness, no bony tenderness, no swelling, no crepitus and no deformity.       Feet:  Neurological: She is alert and oriented to person, place, and time.  Skin: Skin is warm and dry. No erythema.    ED Course  Procedures (including critical care time)   Patient is not tender of the base of the 5th. Follow up with ortho. Return here as needed. Follow up with the wound care clinic.    MDM          Carlyle Dolly, PA-C 10/16/12 [phone]

## 2012-10-15 NOTE — ED Notes (Signed)
Per EMS pt was turning with walker and lost balance.  2 lacerations on 4th and 5th digit of left foot. Pt just dc'd from wound center for lac on 4th toe. Bleeding is controlled. Pt has CBG 134

## 2012-10-16 ENCOUNTER — Emergency Department (HOSPITAL_COMMUNITY): Payer: Medicare Other

## 2012-10-16 MED ORDER — CEPHALEXIN 500 MG PO CAPS: 500.0000 mg | ORAL_CAPSULE | Freq: Four times a day (QID) | ORAL | Status: DC

## 2012-10-16 NOTE — Progress Notes (Signed)
Orthopedic Tech Progress Note Patient Details:  [name] 1961-05-11 [id number]  Ortho Devices Type of Ortho Device: Postop shoe/boot and not cam walker because of size   Haskell Flirt 10/16/2012, 1:59 AM

## 2012-10-16 NOTE — ED Provider Notes (Signed)
Medical screening examination/treatment/procedure(s) were performed by non-physician practitioner and as supervising physician I was immediately available for consultation/collaboration.   Jayna Mulnix, MD 10/16/12 2154 

## 2012-11-06 ENCOUNTER — Ambulatory Visit: Payer: Self-pay | Admitting: Nurse Practitioner

## 2012-12-01 ENCOUNTER — Encounter: Payer: Medicare Other | Attending: *Deleted | Admitting: *Deleted

## 2012-12-01 ENCOUNTER — Ambulatory Visit: Payer: Medicare Other | Admitting: *Deleted

## 2012-12-01 ENCOUNTER — Encounter: Payer: Self-pay | Admitting: *Deleted

## 2012-12-01 VITALS — Ht 68.0 in | Wt >= 6400 oz

## 2012-12-01 DIAGNOSIS — Z713 Dietary counseling and surveillance: Secondary | ICD-10-CM | POA: Insufficient documentation

## 2012-12-01 DIAGNOSIS — E119 Type 2 diabetes mellitus without complications: Secondary | ICD-10-CM | POA: Insufficient documentation

## 2012-12-01 NOTE — Progress Notes (Signed)
Medical Nutrition Therapy:  Appt start time: 1015 end time:  1115.  Assessment:  Primary concerns today: patient here for diabetes and obesity. She is here with her caregiver, she is walking with a walker with great difficulty due to bad balance and neuropathy in feet and legs and has a trach tube but is able to speak clearly. She states she has had 6 falls since December, 2013 and with last one she broke her foot. All of the above limit her ability to have any activity on a daily basis. She states her endocrinologist recent increased her dose of U-500 from 20 to 24 units twice daily and her avereage BGs have improved from around 200 down to around 150 +/- 50 mg/dl. She had what felt like hypoglycemia a couple of days ago and drank 12 oz Coke to treat. This resulted in her BG after treatment of 150.  She states she SMBG 3 or more times a day, She continues to express concern over her kidney function.    MEDICATIONS: Completed review of medications.  DM meds: U-500 @ 24 units in the AM and  PM.  Also taking Victoza and Invocana.  DIETARY INTAKE:  24-hr recall:  B (9:00 AM): 2 eggs, various types of meat, starch bread (1) with small tsp jelly  OR  Unsweetened cereal and 4 oz whole milk, water to drink Snk ( AM) :None  L (mid PM): 1:30-2:00 pack of crackers and PB and a cheese stick. And a piece of fruit on some days, water or flavored water Snk (Mid- PM): None D (6:00 PM): meat,vegetable x 1-2, occasionally a starch and canned fruit  Snk (HS- PM): Maybe a piece of fruit Beverages: Water and diet beverages  Recent physical activity: Limited due to balance and injured foot. States she has a couple of Arm Chair CDs at home that she expresses interest in using.  Estimated energy needs:   1200-1300 calories for weight loss 135 g carbohydrates 90 g protein 35 g fat  Progress Towards Goal(s):  In progress.   Nutritional Diagnosis:  Paul-3.3 Overweight/obesity as evidenced by weight at 406 lb, BMI  at 61.9 kg/m2, increased blood glucose with periods of increased stress, fasting blood glucose levels now improved to 150 mg/dl, B1Y is now down to 7.8%.    Intervention:  Patient has worked with Seward Grater May, RD, CDE in the past, this is her first visit with me. She expressed good verbal understanding of basic diabetes facts including Carb counting and states that she has been most successful with Carb Counting in the past with weight loss efforts. She is very limited in activity options due to morbid obesity, risk of falling and recent foot injury. Discusses Arm Chair exercises starting out very slowly but frequently each day and she expressed interest in this option. Also recommended she test her BG after exercise to make her aware of improved BG as a result. Also encouraged her to call her MD if she has recurrent low BG so her insulin dose can be decreased without risking hypoglycemia.  Plan:  Aim for 2 Carb Choices per meal (30 grams) +/- 1 either way  Aim for 0-1 Carbs per snack if hungry plus small amount of protein as able Continue reading food labels for Total Carbohydrate of foods Consider  increasing your activity level by Armchair Exercise CDs for 1-2 minutes several times a day as tolerated Continue checking BG 3 times per day as directed by MD  after exercising to see  results Consider checking BG after exercising to see results! Weight today was 406 pounds    Handouts given during visit include:  Carb Counting handout  Monitoring/Evaluation:  Dietary intake, exercise, blood glucose levels, and body weight in 3 months after next A1c is drawn.  Patient to phone me  to see how the carb regimen is working in 7 days.[name]

## 2012-12-01 NOTE — Patient Instructions (Addendum)
Plan:  Aim for 2 Carb Choices per meal (30 grams) +/- 1 either way  Aim for 0-1 Carbs per snack if hungry plus small amount of protein as able Continue reading food labels for Total Carbohydrate of foods Consider  increasing your activity level by Armchair Exercise CDs for 1-2 minutes several times a day as tolerated Continue checking BG 3 times per day as directed by MD  after exercising to see results Consider checking BG after exercising to see results! Weight today was 406 pounds

## 2012-12-04 ENCOUNTER — Other Ambulatory Visit: Payer: Self-pay | Admitting: Cardiovascular Disease

## 2012-12-05 ENCOUNTER — Ambulatory Visit: Payer: Self-pay | Admitting: Oncology

## 2012-12-05 ENCOUNTER — Other Ambulatory Visit: Payer: Self-pay | Admitting: Lab

## 2012-12-05 ENCOUNTER — Emergency Department (HOSPITAL_COMMUNITY): Payer: Medicare Other

## 2012-12-05 ENCOUNTER — Emergency Department (HOSPITAL_COMMUNITY)
Admission: EM | Admit: 2012-12-05 | Discharge: 2012-12-05 | Disposition: A | Discharge status: Home / Self Care | Payer: Medicare Other | Attending: Emergency Medicine | Admitting: Emergency Medicine

## 2012-12-05 DIAGNOSIS — N189 Chronic kidney disease, unspecified: Secondary | ICD-10-CM | POA: Insufficient documentation

## 2012-12-05 DIAGNOSIS — Z8739 Personal history of other diseases of the musculoskeletal system and connective tissue: Secondary | ICD-10-CM | POA: Insufficient documentation

## 2012-12-05 DIAGNOSIS — E785 Hyperlipidemia, unspecified: Secondary | ICD-10-CM | POA: Insufficient documentation

## 2012-12-05 DIAGNOSIS — Z87448 Personal history of other diseases of urinary system: Secondary | ICD-10-CM | POA: Insufficient documentation

## 2012-12-05 DIAGNOSIS — Z794 Long term (current) use of insulin: Secondary | ICD-10-CM | POA: Insufficient documentation

## 2012-12-05 DIAGNOSIS — F411 Generalized anxiety disorder: Secondary | ICD-10-CM | POA: Insufficient documentation

## 2012-12-05 DIAGNOSIS — F329 Major depressive disorder, single episode, unspecified: Secondary | ICD-10-CM | POA: Insufficient documentation

## 2012-12-05 DIAGNOSIS — Z8669 Personal history of other diseases of the nervous system and sense organs: Secondary | ICD-10-CM | POA: Insufficient documentation

## 2012-12-05 DIAGNOSIS — Z8679 Personal history of other diseases of the circulatory system: Secondary | ICD-10-CM | POA: Insufficient documentation

## 2012-12-05 DIAGNOSIS — E119 Type 2 diabetes mellitus without complications: Secondary | ICD-10-CM | POA: Insufficient documentation

## 2012-12-05 DIAGNOSIS — K219 Gastro-esophageal reflux disease without esophagitis: Secondary | ICD-10-CM | POA: Insufficient documentation

## 2012-12-05 DIAGNOSIS — J45909 Unspecified asthma, uncomplicated: Secondary | ICD-10-CM | POA: Insufficient documentation

## 2012-12-05 DIAGNOSIS — IMO0002 Reserved for concepts with insufficient information to code with codable children: Secondary | ICD-10-CM | POA: Insufficient documentation

## 2012-12-05 DIAGNOSIS — Z862 Personal history of diseases of the blood and blood-forming organs and certain disorders involving the immune mechanism: Secondary | ICD-10-CM | POA: Insufficient documentation

## 2012-12-05 DIAGNOSIS — Z7901 Long term (current) use of anticoagulants: Secondary | ICD-10-CM | POA: Insufficient documentation

## 2012-12-05 DIAGNOSIS — Z8742 Personal history of other diseases of the female genital tract: Secondary | ICD-10-CM | POA: Insufficient documentation

## 2012-12-05 DIAGNOSIS — Z8619 Personal history of other infectious and parasitic diseases: Secondary | ICD-10-CM | POA: Insufficient documentation

## 2012-12-05 DIAGNOSIS — M1710 Unilateral primary osteoarthritis, unspecified knee: Secondary | ICD-10-CM | POA: Insufficient documentation

## 2012-12-05 DIAGNOSIS — Z79899 Other long term (current) drug therapy: Secondary | ICD-10-CM | POA: Insufficient documentation

## 2012-12-05 DIAGNOSIS — M1990 Unspecified osteoarthritis, unspecified site: Secondary | ICD-10-CM

## 2012-12-05 DIAGNOSIS — M25569 Pain in unspecified knee: Secondary | ICD-10-CM | POA: Insufficient documentation

## 2012-12-05 DIAGNOSIS — I509 Heart failure, unspecified: Secondary | ICD-10-CM | POA: Insufficient documentation

## 2012-12-05 DIAGNOSIS — G609 Hereditary and idiopathic neuropathy, unspecified: Secondary | ICD-10-CM | POA: Insufficient documentation

## 2012-12-05 DIAGNOSIS — M25562 Pain in left knee: Secondary | ICD-10-CM

## 2012-12-05 DIAGNOSIS — Z8701 Personal history of pneumonia (recurrent): Secondary | ICD-10-CM | POA: Insufficient documentation

## 2012-12-05 DIAGNOSIS — F3289 Other specified depressive episodes: Secondary | ICD-10-CM | POA: Insufficient documentation

## 2012-12-05 MED ORDER — OXYCODONE-ACETAMINOPHEN 5-325 MG PO TABS
1.0000 | ORAL_TABLET | ORAL | Status: DC | PRN
Start: 2012-12-05 — End: 2013-02-06

## 2012-12-05 MED ORDER — MELOXICAM 15 MG PO TABS: 15.0000 mg | ORAL_TABLET | Freq: Every day | ORAL | Status: DC

## 2012-12-05 MED ORDER — KETOROLAC TROMETHAMINE 60 MG/2ML IM SOLN
60.0000 mg | Freq: Once | INTRAMUSCULAR | Status: AC
  Administered 2012-12-05: 60 mg via INTRAMUSCULAR
  Filled 2012-12-05: qty 2

## 2012-12-05 NOTE — ED Notes (Signed)
ptar called to transport pt home 

## 2012-12-05 NOTE — ED Provider Notes (Signed)
History     CSN: [id number]  Arrival date & time 12/05/12  1017   First MD Initiated Contact with Patient 12/05/12 1112      Chief Complaint  Patient presents with  . Knee Pain    (Consider location/radiation/quality/duration/timing/severity/associated sxs/prior treatment) HPI Comments: Patient with extensive PMH presents to the ED for left knee pain. Patient states she has a diagnosis of osteoarthritis. She was previously seen by orthopedics and undergone series of cortisone and hyaluronic acid joint injections with some relief of sx, however, her insurance will not pay to have these injections repeated until July. She endorses increased pain over the past few days, worse with weight bearing and ambulation.  No recent injury, trauma, or fall.  Denies any numbness or paresthesias of left lower extremity.  Pt takes daily norco which she states is not helping her sx.   The history is provided by the patient.    Past Medical History  Diagnosis Date  . OSA (obstructive sleep apnea)   . Morbid obesity   . Depressive disorder, not elsewhere classified   . Diabetes mellitus   . Asthma   . Dyslipidemia   . Clostridium difficile enterocolitis 2012  . Splenomegaly 06/01/2011  . Thrombocytopenia 06/01/2011  . Hepatosplenomegaly   . Gout   . GERD (gastroesophageal reflux disease)   . Osteoarthritis   . Polyneuropathy   . Urinary incontinence   . Amenorrhea   . CHF (congestive heart failure)   . Hyperlipidemia   . Leg swelling   . Nausea   . Weakness   . Palpitations   . PONV (postoperative nausea and vomiting)   . Anginal pain     no chest pain in years  . Dysrhythmia     heart palpations  . Anxiety   . Pneumonia   . Shortness of breath   . Chronic kidney disease     overactive bladder  . Peripheral neuropathy     Past Surgical History  Procedure Laterality Date  . Cholecystectomy    . Tracheostomy    . Endometrial ablation    . Skin graft    . Finger surgery     ring finger on right hand  . Esophageal dilation  2003  . Breast biopsy      needle core, left  . Dilation and curettage of uterus      times 2  . Tracheal dilitation  01/24/2012    Procedure: TRACHEAL DILITATION;  Surgeon: [name], MD;  Location: [facility] OR;  Service: ENT;  Laterality: N/A;  Trache change with possible dilation, from size 6 to size 7 uncuffed    Family History  Problem Relation Age of Onset  . Diabetes Father   . Hypertension Father   . Dementia Father   . Pneumonia Father   . Heart disease Mother   . Clotting disorder Mother   . Hypertension Mother   . Heart attack Mother   . Multiple myeloma Paternal Grandmother   . Cancer Paternal Grandmother     multiple myoloma  . Heart disease Paternal Grandfather   . Kidney disease Maternal Grandmother   . Hypertension Sister   . Cancer Paternal Aunt     breast  . Cancer Cousin     breast - all three    History  Substance Use Topics  . Smoking status: Never Smoker   . Smokeless tobacco: Never Used  . Alcohol Use: No     Comment: occasional - once or twice a  year    OB History   Grav Para Term Preterm Abortions TAB SAB Ect Mult Living   0               Review of Systems  Musculoskeletal: Positive for arthralgias.  All other systems reviewed and are negative.    Allergies  Molds & smuts; Nitroglycerin er; Accolate; Allopurinol; Ciprofloxacin; Codeine; Doxycycline; Fexofenadine; Morphine; Sulfa drugs cross reactors; and Sulfonamide derivatives  Home Medications   Current Outpatient Rx  Name  Route  Sig  Dispense  Refill  . acetaminophen (TYLENOL) 500 MG tablet   Oral   Take 1,000 mg by mouth every 6 (six) hours as needed. For pain         . albuterol (PROVENTIL) (2.5 MG/3ML) 0.083% nebulizer solution   Nebulization   Take 2.5 mg by nebulization every 4 (four) hours as needed. For shortness of breath         . ALPRAZolam (XANAX) 0.5 MG tablet   Oral   Take 0.5 mg by mouth 2 (two) times daily as  needed. For anxiety         . atorvastatin (LIPITOR) 10 MG tablet   Oral   Take 10 mg by mouth at bedtime.          . B Complex Vitamins (VITAMIN B COMPLEX PO)   Oral   Take 1 tablet by mouth daily.          . budesonide (PULMICORT) 0.25 MG/2ML nebulizer solution   Nebulization   Take 0.25 mg by nebulization 3 (three) times daily as needed. For shortness of breath.         . bumetanide (BUMEX) 1 MG tablet   Oral   Take 2 mg by mouth 2 (two) times daily.          . busPIRone (BUSPAR) 5 MG tablet   Oral   Take 7.5 mg by mouth 2 (two) times daily.          . Canagliflozin (INVOKANA) 100 MG TABS   Oral   Take 1 tablet by mouth daily before breakfast.         . cephALEXin (KEFLEX) 500 MG capsule   Oral   Take 1 capsule (500 mg total) by mouth 4 (four) times daily.   40 capsule   0   . cholestyramine (QUESTRAN) 4 G packet   Oral   Take 1 packet by mouth as needed. For loose stools, IBS.         . clotrimazole (LOTRIMIN) 1 % cream   Topical   Apply 1 application topically daily. To stomach         . colchicine 0.6 MG tablet   Oral   Take 0.6 mg by mouth daily.         [name] dicyclomine (BENTYL) 20 MG tablet   Oral   Take 20 mg by mouth 2 (two) times daily.           . DULoxetine (CYMBALTA) 30 MG capsule   Oral   Take 30 mg by mouth 2 (two) times daily.           . ergocalciferol (VITAMIN D2) 50000 UNITS capsule   Oral   Take 50,000 Units by mouth once a week. Take on Fridays         . Febuxostat (ULORIC) 80 MG TABS   Oral   Take 80 mg by mouth at bedtime.          [name]  fesoterodine (TOVIAZ) 4 MG TB24   Oral   Take 4 mg by mouth daily.           . fish oil-omega-3 fatty acids 1000 MG capsule   Oral   Take 1 g by mouth 2 (two) times daily.          [name] gabapentin (NEURONTIN) 100 MG capsule   Oral   Take 100 mg by mouth 4 (four) times daily.          [name] guaiFENesin (MUCINEX) 600 MG 12 hr tablet   Oral   Take 600 mg by mouth 2  (two) times daily.          [name] HYDROcodone-acetaminophen (VICODIN) 5-500 MG per tablet   Oral   Take 1 tablet by mouth every 6 (six) hours as needed. For pain         . insulin regular human CONCENTRATED (HUMULIN R) 500 UNIT/ML SOLN injection   Subcutaneous   Inject 20 Units into the skin 2 (two) times daily before a meal.         . ipratropium (ATROVENT) 0.02 % nebulizer solution   Nebulization   Take 2.5 mLs (500 mcg total) by nebulization 4 (four) times daily.   75 mL   12   . Liraglutide (VICTOZA) 18 MG/3ML SOLN injection   Subcutaneous   Inject 1.8 mg into the skin daily.         [name] loratadine (CLARITIN) 10 MG tablet   Oral   Take 10 mg by mouth daily as needed. For allergies         . losartan (COZAAR) 100 MG tablet   Oral   Take 100 mg by mouth daily.         . meclizine (ANTIVERT) 25 MG tablet   Oral   Take 25 mg by mouth at bedtime.          . metolazone (ZAROXOLYN) 5 MG tablet   Oral   Take 5 mg by mouth 3 (three) times a week.          . Multiple Vitamin (MULTIVITAMIN) capsule   Oral   Take 1 capsule by mouth daily.           . nebivolol (BYSTOLIC) 10 MG tablet   Oral   Take 10 mg by mouth daily.           [name] omeprazole (PRILOSEC) 40 MG capsule   Oral   Take 40 mg by mouth 2 (two) times daily.          [name] OVER THE COUNTER MEDICATION   Apply externally   Apply 1 application topically daily. Anti fungal medication OTC         . potassium chloride SA (K-DUR,KLOR-CON) 20 MEQ tablet   Oral   Take 20 mEq by mouth 3 (three) times daily.          . primidone (MYSOLINE) 50 MG tablet   Oral   Take 25-75 mg by mouth 2 (two) times daily. Take 25 MG in the morning and 75 MG at bedtime.         [name] PROBIOTIC CAPS   Oral   Take 1 capsule by mouth daily.          . promethazine (PHENERGAN) 25 MG tablet   Oral   Take 25 mg by mouth every 6 (six) hours as needed. For nausea/vomiting         . topiramate (TOPAMAX) 25 MG tablet  Oral   Take 1 tablet (25 mg total) by mouth daily.         [name] warfarin (COUMADIN) 5 MG tablet   Oral   Take 5-7.5 mg by mouth daily. Take 1 tablet (5mg ) on Monday, Tuesday, Thursday, Friday.   Take 1.5 tablet (7.5mg ) on Sunday, Wednesday, and saturday           There were no vitals taken for this visit.  Physical Exam  Nursing note and vitals reviewed. Constitutional: She is oriented to person, place, and time.  Morbidly obese  HENT:  Head: Normocephalic and atraumatic.  Eyes: Conjunctivae and EOM are normal.  Neck: Normal range of motion. Neck supple.  Cardiovascular: Normal rate, regular rhythm and normal heart sounds.   Pulmonary/Chest: Effort normal and breath sounds normal.  Musculoskeletal:       Left knee: She exhibits decreased range of motion. She exhibits no effusion, no ecchymosis, no deformity, no laceration, no erythema and normal alignment. Tenderness found. Medial joint line, lateral joint line and patellar tendon tenderness noted.  Diffuse TTP of knee, medial > lateral joint line  Neurological: She is alert and oriented to person, place, and time.  Skin: Skin is warm and dry.  Psychiatric: She has a normal mood and affect.    ED Course  Procedures (including critical care time)  Labs Reviewed - No data to display Dg Knee 2 Views Left  12/05/2012   *RADIOLOGY REPORT*  Clinical Data: Left knee pain.  LEFT KNEE - 1-2 VIEW  Comparison: No priors.  Findings: Three views of the left knee demonstrate no definite acute displaced fracture, subluxation or dislocation.  There is joint space narrowing, subchondral sclerosis and osteophyte formation throughout the knee joint, most pronounced in the medial and patellofemoral compartments, compatible with moderate to severe osteoarthritis.  IMPRESSION: 1.  No acute radiographic abnormality of the left knee. 2.  Moderate to severe osteoarthritis, most severe in the medial and patellofemoral compartments.   Original Report  Authenticated By: Trudie Reed, [name]     1. Osteoarthritis   2. Knee pain, left       MDM   X-ray negative for acute findings- OA.  Toradol given in the ED.  Recent kidney function was normal and appropriate for admin of anti-inflammatories.  Rx mobic and percocet.  FU with ortho as soon as possible.  Discussed plan with pt, she agreed.        Garlon Hatchet, PA-C 12/05/12 1616

## 2012-12-05 NOTE — ED Notes (Signed)
Pt has trach for sleep apnea

## 2012-12-05 NOTE — ED Notes (Addendum)
Per ems pt has had left knee pain since yesterday 5pm.  Pt unable to put normal amount of weight on left knee as normal.  Pt took vicodin at 3am today with no real relief.  Pt alert oriented X4, Vital 134 palp. 18, 84 reg, pain 7/12 pain.  Pt states she called her orthopedic office but they are out of the office.  Pt has taken blood sugar meds but no others.

## 2012-12-05 NOTE — Telephone Encounter (Signed)
Sent refill

## 2012-12-06 NOTE — ED Provider Notes (Signed)
Medical screening examination/treatment/procedure(s) were performed by non-physician practitioner and as supervising physician I was immediately available for consultation/collaboration.   Joya Gaskins, MD 12/06/12 [phone]

## 2012-12-18 ENCOUNTER — Other Ambulatory Visit: Payer: Self-pay | Admitting: Medical Oncology

## 2012-12-18 ENCOUNTER — Telehealth: Payer: Self-pay | Admitting: Oncology

## 2012-12-19 ENCOUNTER — Ambulatory Visit: Payer: Self-pay | Admitting: Oncology

## 2012-12-19 ENCOUNTER — Other Ambulatory Visit: Payer: Self-pay | Admitting: Lab

## 2012-12-31 ENCOUNTER — Other Ambulatory Visit: Payer: Self-pay | Admitting: Cardiovascular Disease

## 2012-12-31 NOTE — Telephone Encounter (Signed)
Rx was sent to pharmacy electronically. 

## 2013-01-14 ENCOUNTER — Telehealth: Payer: Self-pay | Admitting: Pulmonary Disease

## 2013-01-14 NOTE — Telephone Encounter (Signed)
Returning call.

## 2013-01-14 NOTE — Telephone Encounter (Signed)
Received paperwork for renewal of DME. Per VS - pt needs to be seen before we can do this.  lmtcb

## 2013-01-14 NOTE — Telephone Encounter (Signed)
Appointment has been scheduled for 01/23/13 at 9am.

## 2013-01-23 ENCOUNTER — Ambulatory Visit: Payer: Medicare Other | Admitting: Pulmonary Disease

## 2013-01-27 ENCOUNTER — Telehealth: Payer: Self-pay | Admitting: Pulmonary Disease

## 2013-01-27 NOTE — Telephone Encounter (Signed)
Patient has been rescheduled for 8/15 @915am  with TP Nothing further needed at this time

## 2013-01-28 ENCOUNTER — Ambulatory Visit: Payer: Self-pay | Admitting: Pulmonary Disease

## 2013-02-03 ENCOUNTER — Ambulatory Visit: Payer: Self-pay | Admitting: Nurse Practitioner

## 2013-02-06 ENCOUNTER — Encounter: Payer: Self-pay | Admitting: Adult Health

## 2013-02-06 ENCOUNTER — Ambulatory Visit (INDEPENDENT_AMBULATORY_CARE_PROVIDER_SITE_OTHER): Payer: Medicare Other | Admitting: Adult Health

## 2013-02-06 VITALS — BP 134/82 | HR 74 | Temp 97.3°F

## 2013-02-06 DIAGNOSIS — E678 Other specified hyperalimentation: Secondary | ICD-10-CM

## 2013-02-06 DIAGNOSIS — J45909 Unspecified asthma, uncomplicated: Secondary | ICD-10-CM

## 2013-02-06 NOTE — Assessment & Plan Note (Signed)
Trach dependent with nocuturnal vent support  Doing well , cont on vent support Cont w/ trach care and vent support At bedtime   DME paperwork completed.

## 2013-02-06 NOTE — Progress Notes (Signed)
  Subjective:    Patient ID: [name], female    DOB: 20-Nov-1960, 52 y.o.   MRN: [id number]  HPI [name] is a 52 y.o. female with OSA/OHS s/p trach on nocturnal vent, Asthma. Never smoker   02/06/2013 Follow up  Pt returns for follow up for OHS and Asthma  Patient says, that she's been doing well from a breathing, standpoint. Continues on vent support at bedtime. Does use her Pulmicort and Atrovent nebulizers. Most days Has had some difficulty with knee problems, and lower extremity cellulitis, which is being followed by her family doctor an orthopod. She denies any hemoptysis, orthopnea, PND, or fever.   Review of Systems Constitutional:   No  weight loss, night sweats,  Fevers, chills,  +fatigue, or  lassitude.  HEENT:   No headaches,  Difficulty swallowing,  Tooth/dental problems, or  Sore throat,                No sneezing, itching, ear ache, nasal congestion, post nasal drip,   CV:  No chest pain,  Orthopnea, PND, swelling in lower extremities, anasarca, dizziness, palpitations, syncope.   GI  No heartburn, indigestion, abdominal pain, nausea, vomiting, diarrhea, change in bowel habits, loss of appetite, bloody stools.   Resp:  No chest wall deformity  Skin: no rash or lesions.  GU: no dysuria, change in color of urine, no urgency or frequency.  No flank pain, no hematuria   MS:  No joint pain or swelling.  No decreased range of motion.  No back pain.  Psych:  No change in mood or affect. No depression or anxiety.  No memory loss.         Objective:   Physical Exam GEN: A/Ox3; pleasant , NAD  HEENT:  [place]/AT,  EACs-clear, TMs-wnl, NOSE-clear, THROAT-clear, no lesions, no postnasal drip or exudate noted.   NECK:  Supple w/ fair ROM; no JVD; normal carotid impulses w/o bruits; no thyromegaly or nodules palpated; no lymphadenopathy.trach clean and dry   RESP  Diminshed BS in bases . no accessory muscle use, no dullness to percussion  CARD:  RRR, no m/r/g  ,  no peripheral edema, pulses intact, no cyanosis or clubbing.  GI:   Soft & nt; nml bowel sounds; no organomegaly or masses detected.morbidly obese with large panniculus   Musco: Warm bil, no deformities or joint swelling noted.   Neuro: alert, no focal deficits noted.    Skin: Warm, no lesions or rashes         Assessment & Plan:

## 2013-02-06 NOTE — Patient Instructions (Addendum)
Take Pulmicort Neb Twice daily   Take Ipratropium Three times a day   May use Albuterol Neb every 4hrs as needed.  Continue on vent support at night to sleep.  Get Flu shot this fall when available  follow up Dr. Craige Cotta  In 6 months and As needed

## 2013-02-06 NOTE — Assessment & Plan Note (Signed)
Cont on pulmicort Twice daily  And atrovent neb Three times a day   W/ As needed  Albuterol

## 2013-02-14 ENCOUNTER — Other Ambulatory Visit: Payer: Self-pay

## 2013-02-14 MED ORDER — TOPIRAMATE 50 MG PO TABS: 50.0000 mg | ORAL_TABLET | Freq: Every day | ORAL | Status: DC

## 2013-03-02 ENCOUNTER — Other Ambulatory Visit: Payer: Self-pay | Admitting: Cardiovascular Disease

## 2013-03-03 ENCOUNTER — Other Ambulatory Visit: Payer: Self-pay | Admitting: Cardiovascular Disease

## 2013-03-03 NOTE — Telephone Encounter (Signed)
Rx was sent to pharmacy electronically. 

## 2013-03-05 ENCOUNTER — Other Ambulatory Visit: Payer: Self-pay

## 2013-03-05 MED ORDER — TOPIRAMATE 50 MG PO TABS: 50.0000 mg | ORAL_TABLET | Freq: Every day | ORAL | Status: DC

## 2013-03-05 MED ORDER — GABAPENTIN 100 MG PO CAPS: 100.0000 mg | ORAL_CAPSULE | Freq: Four times a day (QID) | ORAL | Status: DC

## 2013-03-05 MED ORDER — DULOXETINE HCL 30 MG PO CPEP: 30.0000 mg | ORAL_CAPSULE | Freq: Two times a day (BID) | ORAL | Status: DC

## 2013-03-05 NOTE — Telephone Encounter (Signed)
Former [facility] patient.  Auth refills via [facility].  Has appt with CM scheduled.

## 2013-03-06 ENCOUNTER — Other Ambulatory Visit: Payer: Self-pay

## 2013-03-06 MED ORDER — PRIMIDONE 50 MG PO TABS: ORAL_TABLET | ORAL | Status: DC

## 2013-03-09 ENCOUNTER — Other Ambulatory Visit: Payer: Self-pay | Admitting: Neurology

## 2013-03-10 ENCOUNTER — Ambulatory Visit: Payer: Medicare Other | Admitting: *Deleted

## 2013-03-27 ENCOUNTER — Ambulatory Visit: Payer: Self-pay | Admitting: Nurse Practitioner

## 2013-04-06 ENCOUNTER — Ambulatory Visit: Payer: Medicare Other | Admitting: Cardiovascular Disease

## 2013-04-06 ENCOUNTER — Other Ambulatory Visit: Payer: Self-pay | Admitting: Neurology

## 2013-04-16 ENCOUNTER — Other Ambulatory Visit: Payer: Self-pay | Admitting: Neurology

## 2013-04-21 ENCOUNTER — Ambulatory Visit: Payer: Medicare Other | Admitting: *Deleted

## 2013-04-22 ENCOUNTER — Emergency Department (HOSPITAL_COMMUNITY): Payer: Medicare Other

## 2013-04-22 ENCOUNTER — Inpatient Hospital Stay (HOSPITAL_COMMUNITY)
Admission: EM | Admit: 2013-04-22 | Discharge: 2013-04-26 | DRG: 690 | Disposition: A | Discharge status: Home / Self Care | Payer: Medicare Other | Attending: Internal Medicine | Admitting: Internal Medicine

## 2013-04-22 ENCOUNTER — Encounter (HOSPITAL_COMMUNITY): Payer: Self-pay | Admitting: Emergency Medicine

## 2013-04-22 DIAGNOSIS — Z7901 Long term (current) use of anticoagulants: Secondary | ICD-10-CM

## 2013-04-22 DIAGNOSIS — N10 Acute tubulo-interstitial nephritis: Secondary | ICD-10-CM | POA: Diagnosis present

## 2013-04-22 DIAGNOSIS — Z86718 Personal history of other venous thrombosis and embolism: Secondary | ICD-10-CM

## 2013-04-22 DIAGNOSIS — E876 Hypokalemia: Secondary | ICD-10-CM | POA: Diagnosis present

## 2013-04-22 DIAGNOSIS — F411 Generalized anxiety disorder: Secondary | ICD-10-CM | POA: Diagnosis present

## 2013-04-22 DIAGNOSIS — N318 Other neuromuscular dysfunction of bladder: Secondary | ICD-10-CM | POA: Diagnosis present

## 2013-04-22 DIAGNOSIS — M109 Gout, unspecified: Secondary | ICD-10-CM | POA: Diagnosis present

## 2013-04-22 DIAGNOSIS — Z22322 Carrier or suspected carrier of Methicillin resistant Staphylococcus aureus: Secondary | ICD-10-CM

## 2013-04-22 DIAGNOSIS — I129 Hypertensive chronic kidney disease with stage 1 through stage 4 chronic kidney disease, or unspecified chronic kidney disease: Secondary | ICD-10-CM | POA: Diagnosis present

## 2013-04-22 DIAGNOSIS — F3289 Other specified depressive episodes: Secondary | ICD-10-CM | POA: Diagnosis present

## 2013-04-22 DIAGNOSIS — E678 Other specified hyperalimentation: Secondary | ICD-10-CM | POA: Diagnosis present

## 2013-04-22 DIAGNOSIS — M1990 Unspecified osteoarthritis, unspecified site: Secondary | ICD-10-CM | POA: Diagnosis present

## 2013-04-22 DIAGNOSIS — I509 Heart failure, unspecified: Secondary | ICD-10-CM | POA: Diagnosis present

## 2013-04-22 DIAGNOSIS — K219 Gastro-esophageal reflux disease without esophagitis: Secondary | ICD-10-CM | POA: Diagnosis present

## 2013-04-22 DIAGNOSIS — Z6841 Body Mass Index (BMI) 40.0 and over, adult: Secondary | ICD-10-CM

## 2013-04-22 DIAGNOSIS — N12 Tubulo-interstitial nephritis, not specified as acute or chronic: Secondary | ICD-10-CM | POA: Diagnosis present

## 2013-04-22 DIAGNOSIS — E785 Hyperlipidemia, unspecified: Secondary | ICD-10-CM | POA: Diagnosis present

## 2013-04-22 DIAGNOSIS — E119 Type 2 diabetes mellitus without complications: Secondary | ICD-10-CM | POA: Diagnosis present

## 2013-04-22 DIAGNOSIS — J9610 Chronic respiratory failure, unspecified whether with hypoxia or hypercapnia: Secondary | ICD-10-CM

## 2013-04-22 DIAGNOSIS — E669 Obesity, unspecified: Secondary | ICD-10-CM

## 2013-04-22 DIAGNOSIS — Z930 Tracheostomy status: Secondary | ICD-10-CM

## 2013-04-22 DIAGNOSIS — G4733 Obstructive sleep apnea (adult) (pediatric): Secondary | ICD-10-CM | POA: Diagnosis present

## 2013-04-22 DIAGNOSIS — Z794 Long term (current) use of insulin: Secondary | ICD-10-CM

## 2013-04-22 DIAGNOSIS — E662 Morbid (severe) obesity with alveolar hypoventilation: Secondary | ICD-10-CM | POA: Diagnosis present

## 2013-04-22 DIAGNOSIS — E1169 Type 2 diabetes mellitus with other specified complication: Secondary | ICD-10-CM | POA: Diagnosis present

## 2013-04-22 DIAGNOSIS — G609 Hereditary and idiopathic neuropathy, unspecified: Secondary | ICD-10-CM | POA: Diagnosis present

## 2013-04-22 DIAGNOSIS — F329 Major depressive disorder, single episode, unspecified: Secondary | ICD-10-CM | POA: Diagnosis present

## 2013-04-22 DIAGNOSIS — N189 Chronic kidney disease, unspecified: Secondary | ICD-10-CM | POA: Diagnosis present

## 2013-04-22 DIAGNOSIS — I4891 Unspecified atrial fibrillation: Secondary | ICD-10-CM | POA: Diagnosis present

## 2013-04-22 DIAGNOSIS — J45909 Unspecified asthma, uncomplicated: Secondary | ICD-10-CM | POA: Diagnosis present

## 2013-04-22 LAB — CBC WITH DIFFERENTIAL/PLATELET
Basophils Absolute: 0 10*3/uL (ref 0.0–0.1)
Basophils Relative: 0 % (ref 0–1)
Eosinophils Absolute: 0 10*3/uL (ref 0.0–0.7)
Eosinophils Relative: 1 % (ref 0–5)
HCT: 36.5 % (ref 36.0–46.0)
Hemoglobin: 12.5 g/dL (ref 12.0–15.0)
Lymphocytes Relative: 14 % (ref 12–46)
Lymphs Abs: 1.2 10*3/uL (ref 0.7–4.0)
MCH: 32.3 pg (ref 26.0–34.0)
MCHC: 34.2 g/dL (ref 30.0–36.0)
MCV: 94.3 fL (ref 78.0–100.0)
Monocytes Absolute: 1.5 K/uL — ABNORMAL HIGH (ref 0.1–1.0)
Monocytes Relative: 17 % — ABNORMAL HIGH (ref 3–12)
Neutro Abs: 5.9 K/uL (ref 1.7–7.7)
Neutrophils Relative %: 69 % (ref 43–77)
Platelets: 127 10*3/uL — ABNORMAL LOW (ref 150–400)
RBC: 3.87 MIL/uL (ref 3.87–5.11)
RDW: 15.2 % (ref 11.5–15.5)
WBC: 8.6 10*3/uL (ref 4.0–10.5)

## 2013-04-22 LAB — COMPREHENSIVE METABOLIC PANEL WITH GFR
Albumin: 3.3 g/dL — ABNORMAL LOW (ref 3.5–5.2)
BUN: 11 mg/dL (ref 6–23)
Calcium: 8.9 mg/dL (ref 8.4–10.5)
Chloride: 98 meq/L (ref 96–112)
Creatinine, Ser: 1.06 mg/dL (ref 0.50–1.10)
GFR calc non Af Amer: 60 mL/min — ABNORMAL LOW (ref >90)
Total Bilirubin: 0.5 mg/dL (ref 0.3–1.2)

## 2013-04-22 LAB — COMPREHENSIVE METABOLIC PANEL
ALT: 26 U/L (ref 0–35)
AST: 44 U/L — ABNORMAL HIGH (ref 0–37)
Alkaline Phosphatase: 56 U/L (ref 39–117)
CO2: 26 mEq/L (ref 19–32)
GFR calc Af Amer: 70 mL/min — ABNORMAL LOW (ref >90)
Glucose, Bld: 128 mg/dL — ABNORMAL HIGH (ref 70–99)
Potassium: 3.5 mEq/L (ref 3.5–5.1)
Sodium: 137 mEq/L (ref 135–145)
Total Protein: 8 g/dL (ref 6.0–8.3)

## 2013-04-22 LAB — URINALYSIS, ROUTINE W REFLEX MICROSCOPIC
Bilirubin Urine: NEGATIVE
Glucose, UA: >1000 mg/dL — AB
Hgb urine dipstick: NEGATIVE
Ketones, ur: NEGATIVE mg/dL
Protein, ur: NEGATIVE mg/dL
pH: 6.5 (ref 5.0–8.0)

## 2013-04-22 LAB — URINE MICROSCOPIC-ADD ON

## 2013-04-22 LAB — CG4 I-STAT (LACTIC ACID): Lactic Acid, Venous: 1.55 mmol/L (ref 0.5–2.2)

## 2013-04-22 MED ORDER — SODIUM CHLORIDE 0.9 % IV BOLUS (SEPSIS)
1000.0000 mL | Freq: Once | INTRAVENOUS | Status: AC
  Administered 2013-04-22: 1000 mL via INTRAVENOUS

## 2013-04-22 MED ORDER — KETOROLAC TROMETHAMINE 30 MG/ML IJ SOLN
30.0000 mg | Freq: Once | INTRAMUSCULAR | Status: AC
  Administered 2013-04-22: 30 mg via INTRAVENOUS
  Filled 2013-04-22: qty 1

## 2013-04-22 MED ORDER — ACETAMINOPHEN 325 MG PO TABS: 650.0000 mg | ORAL_TABLET | Freq: Four times a day (QID) | ORAL | Status: DC | PRN

## 2013-04-22 MED ORDER — DEXTROSE 5 % IV SOLN
1.0000 g | Freq: Once | INTRAVENOUS | Status: AC
  Administered 2013-04-22: 1 g via INTRAVENOUS
  Filled 2013-04-22: qty 10

## 2013-04-22 NOTE — ED Notes (Signed)
Erin RN on 2C is getting a bariatric bed for Pt.  Will call when they are ready for pt.

## 2013-04-22 NOTE — ED Notes (Signed)
Per EMS: pt c/o generalized body aches, new onset incontinence, back pain, and fever of 102.1. sbp 130 palpated, HR 80, O2 96% with O2 on trach.

## 2013-04-22 NOTE — Consult Note (Signed)
PULMONARY  / CRITICAL CARE MEDICINE  Name: [name] MRN: [id number] DOB: 25-Oct-1960    ADMISSION DATE:  04/22/2013 CONSULTATION DATE:  04/22/2013  REFERRING MD :  Bonney Leitz PRIMARY SERVICE: [facility]  CHIEF COMPLAINT:  fever  BRIEF PATIENT DESCRIPTION: 52 y/o female with chronic nocturnal vent for obesity hypoventilation was admitted on 10/29 for pyelonephritis, PCCM consulted to assist with home vent settings.  SIGNIFICANT EVENTS / STUDIES:    LINES / TUBES:   CULTURES: 10/29 blood >> 10/29 urine >>  ANTIBIOTICS:   HISTORY OF PRESENT ILLNESS:  52 y/o female with chronic nocturnal vent for obesity hypoventilation was admitted on 10/29 for pyelonephritis, PCCM consulted to assist with home vent settings. She stated that she only had some mild dyspnea when she had a fever, but otherwise has been breathing well lately without difficulty.  She noted that on prior admission she typically is not able to tolerate our vents.  She does not believe that she can have her home vent brought in.  PAST MEDICAL HISTORY :  Past Medical History  Diagnosis Date  . OSA (obstructive sleep apnea)   . Morbid obesity   . Depressive disorder, not elsewhere classified   . Diabetes mellitus   . Asthma   . Dyslipidemia   . Clostridium difficile enterocolitis 2012  . Splenomegaly 06/01/2011  . Thrombocytopenia 06/01/2011  . Hepatosplenomegaly   . Gout   . GERD (gastroesophageal reflux disease)   . Osteoarthritis   . Polyneuropathy   . Urinary incontinence   . Amenorrhea   . CHF (congestive heart failure)   . Hyperlipidemia   . Leg swelling   . Nausea   . Weakness   . Palpitations   . PONV (postoperative nausea and vomiting)   . Anginal pain     no chest pain in years  . Dysrhythmia     heart palpations  . Anxiety   . Pneumonia   . Shortness of breath   . Chronic kidney disease     overactive bladder  . Peripheral neuropathy    Past Surgical History  Procedure Laterality Date  .  Cholecystectomy    . Tracheostomy    . Endometrial ablation    . Skin graft    . Finger surgery      ring finger on right hand  . Esophageal dilation  2003  . Breast biopsy      needle core, left  . Dilation and curettage of uterus      times 2  . Tracheal dilitation  01/24/2012    Procedure: TRACHEAL DILITATION;  Surgeon: [name], MD;  Location: [facility] OR;  Service: ENT;  Laterality: N/A;  Trache change with possible dilation, from size 6 to size 7 uncuffed   Prior to Admission medications   Medication Sig Start Date End Date Taking? Authorizing Provider  acetaminophen (TYLENOL) 500 MG tablet Take 1,000 mg by mouth every 6 (six) hours as needed. For pain   Yes Historical Provider, MD  albuterol (PROVENTIL) (2.5 MG/3ML) 0.083% nebulizer solution Take 2.5 mg by nebulization every 4 (four) hours as needed. For shortness of breath 05/28/11  Yes Coralyn Helling, MD  ALPRAZolam Prudy Feeler) 0.5 MG tablet Take 0.5 mg by mouth 2 (two) times daily as needed. For anxiety   Yes Historical Provider, MD  aspirin 81 MG chewable tablet Chew 81 mg by mouth daily.   Yes Historical Provider, MD  atorvastatin (LIPITOR) 10 MG tablet Take 10 mg by mouth at bedtime.  Yes Historical Provider, MD  B Complex Vitamins (VITAMIN B COMPLEX PO) Take 1 tablet by mouth daily.    Yes Historical Provider, MD  budesonide (PULMICORT) 0.25 MG/2ML nebulizer solution Take 0.25 mg by nebulization 2 (two) times daily as needed. For shortness of breath. 05/28/11  Yes Coralyn Helling, MD  bumetanide (BUMEX) 2 MG tablet Take 2 mg by mouth daily.  12/31/12  Yes Mihai Croitoru, MD  busPIRone (BUSPAR) 5 MG tablet Take 7.5 mg by mouth 2 (two) times daily.    Yes Historical Provider, MD  Canagliflozin (INVOKANA) 100 MG TABS Take 100 mg by mouth daily.    Yes Historical Provider, MD  cholestyramine Lanetta Inch) 4 G packet Take 1 packet by mouth as needed. For loose stools, IBS.   Yes Historical Provider, MD  clotrimazole (LOTRIMIN) 1 % cream Apply 1  application topically daily. To stomach   Yes Historical Provider, MD  colchicine 0.6 MG tablet Take 0.6 mg by mouth daily.   Yes Historical Provider, MD  DULoxetine (CYMBALTA) 30 MG capsule Take 30 mg by mouth 2 (two) times daily.   Yes Historical Provider, MD  ergocalciferol (VITAMIN D2) 50000 UNITS capsule Take 50,000 Units by mouth once a week. Take on Fridays   Yes Historical Provider, MD  Febuxostat (ULORIC) 80 MG TABS Take 80 mg by mouth at bedtime.    Yes Historical Provider, MD  fesoterodine (TOVIAZ) 4 MG TB24 Take 4 mg by mouth daily.     Yes Historical Provider, MD  fish oil-omega-3 fatty acids 1000 MG capsule Take 1 g by mouth 2 (two) times daily.    Yes Historical Provider, MD  gabapentin (NEURONTIN) 100 MG capsule Take 1 capsule (100 mg total) by mouth 4 (four) times daily. 04/16/13  Yes Huston Foley, MD  guaiFENesin (MUCINEX) 600 MG 12 hr tablet Take 600 mg by mouth 2 (two) times daily.    Yes Historical Provider, MD  HYDROcodone-acetaminophen (NORCO/VICODIN) 5-325 MG per tablet Take 1 tablet by mouth every 6 (six) hours as needed for pain.   Yes Historical Provider, MD  insulin regular human CONCENTRATED (HUMULIN R) 500 UNIT/ML SOLN injection Inject 28 Units into the skin 2 (two) times daily before a meal.    Yes Historical Provider, MD  ipratropium (ATROVENT) 0.02 % nebulizer solution Take 2.5 mLs (500 mcg total) by nebulization 4 (four) times daily. 03/27/12  Yes Coralyn Helling, MD  Liraglutide (VICTOZA) 18 MG/3ML SOLN injection Inject 1.8 mg into the skin daily.   Yes Historical Provider, MD  loratadine (CLARITIN) 10 MG tablet Take 10 mg by mouth daily as needed. For allergies   Yes Historical Provider, MD  losartan (COZAAR) 100 MG tablet Take 100 mg by mouth daily.   Yes Historical Provider, MD  meclizine (ANTIVERT) 25 MG tablet Take 25 mg by mouth at bedtime.    Yes Historical Provider, MD  metolazone (ZAROXOLYN) 5 MG tablet Take 5 mg by mouth See admin instructions. 4 times weekly as  needed for edema   Yes Historical Provider, MD  Multiple Vitamin (MULTIVITAMIN) capsule Take 1 capsule by mouth daily.     Yes Historical Provider, MD  nebivolol (BYSTOLIC) 10 MG tablet Take 10 mg by mouth daily.   Yes Historical Provider, MD  nitrofurantoin, macrocrystal-monohydrate, (MACROBID) 100 MG capsule Take 100 mg by mouth 2 (two) times daily. 04/21/13  Yes Historical Provider, MD  omeprazole (PRILOSEC) 40 MG capsule Take 40 mg by mouth 2 (two) times daily.    Yes Historical Provider,  MD  potassium chloride SA (K-DUR,KLOR-CON) 20 MEQ tablet Take 40 mEq by mouth 3 (three) times daily.    Yes Historical Provider, MD  primidone (MYSOLINE) 50 MG tablet Take 25-75 mg by mouth 2 (two) times daily. 25mg  in the morning; 75mg  in the evening   Yes Historical Provider, MD  PROBIOTIC CAPS Take 1 capsule by mouth 2 (two) times daily.    Yes Historical Provider, MD  promethazine (PHENERGAN) 25 MG tablet Take 25 mg by mouth every 6 (six) hours as needed. For nausea/vomiting   Yes Historical Provider, MD  pyridOXINE (VITAMIN B-6) 100 MG tablet Take 100 mg by mouth daily.   Yes Historical Provider, MD  topiramate (TOPAMAX) 50 MG tablet Take 1 tablet (50 mg total) by mouth daily. 03/05/13  Yes Levert Feinstein, MD  warfarin (COUMADIN) 5 MG tablet Take 5-7.5 mg by mouth daily. Take 1 tablet (5mg ) on Monday, Tuesday, Thursday, Friday.   Take 1.5 tablet (7.5mg ) on Sunday, Wednesday, and saturday   Yes Historical Provider, MD   Allergies  Allergen Reactions  . Molds & Smuts Shortness Of Breath and Rash  . Nitroglycerin Er Nausea And Vomiting  . Accolate [Zafirlukast] Other (See Comments)    Reaction unknown  . Allopurinol Other (See Comments)    GI problems   . Ciprofloxacin Other (See Comments)    GI problems  . Codeine Other (See Comments)    Heart problems  . Doxycycline Other (See Comments)    Gastric problems  . Fexofenadine Other (See Comments)    Hurts joints/pain  . Morphine Other (See Comments)     unknown  . Sulfa Drugs Cross Reactors Nausea And Vomiting    nausea  . Sulfonamide Derivatives Nausea Only    FAMILY HISTORY:  Family History  Problem Relation Age of Onset  . Diabetes Father   . Hypertension Father   . Dementia Father   . Pneumonia Father   . Heart disease Mother   . Clotting disorder Mother   . Hypertension Mother   . Heart attack Mother   . Multiple myeloma Paternal Grandmother   . Cancer Paternal Grandmother     multiple myoloma  . Heart disease Paternal Grandfather   . Kidney disease Maternal Grandmother   . Hypertension Sister   . Cancer Paternal Aunt     breast  . Cancer Cousin     breast - all three   SOCIAL HISTORY:  reports that she has never smoked. She has never used smokeless tobacco. She reports that she does not drink alcohol or use illicit drugs.  REVIEW OF SYSTEMS:   Gen: + fever, + chills, denies weight change, fatigue, night sweats HEENT: Denies blurred vision, double vision, hearing loss, tinnitus, sinus congestion, rhinorrhea, sore throat, neck stiffness, dysphagia PULM: per HPI CV: Denies chest pain, edema, orthopnea, paroxysmal nocturnal dyspnea, palpitations GI: Denies abdominal pain, nausea, vomiting, diarrhea, hematochezia, melena, constipation, change in bowel habits GU: Denies dysuria, hematuria, polyuria, oliguria, urethral discharge Endocrine: Denies hot or cold intolerance, polyuria, polyphagia or appetite change Derm: Denies rash, dry skin, scaling or peeling skin change Heme: Denies easy bruising, bleeding, bleeding gums Neuro: Denies headache, numbness, weakness, slurred speech, loss of memory or consciousness   SUBJECTIVE:   VITAL SIGNS: Temp:  [100.2 F (37.9 C)-102.2 F (39 C)] 100.2 F (37.9 C) (10/29 2032) Pulse Rate:  [72-81] 72 (10/29 2100) Resp:  [15-26] 15 (10/29 2100) BP: (97-126)/(42-63) 110/50 mmHg (10/29 2100) SpO2:  [97 %-100 %] 97 % (10/29  2100) FiO2 (%):  [28 %] 28 % (10/29 2147) HEMODYNAMICS:    VENTILATOR SETTINGS: Vent Mode:  [-]  FiO2 (%):  [28 %] 28 % INTAKE / OUTPUT: Intake/Output   None     PHYSICAL EXAMINATION:  Gen: morbidly obese, no acute distress HEENT: NCAT, EOMi, Trach site c/d/i, PMV in place PULM: CTA B,  CV: RRR, distant heart sounds, no JVD AB: BS+, soft, nontender, no hsm, large panus Ext: warm, no edema, no clubbing, no cyanosis Derm: chronic venous stasis changes bilateral legs Neuro: A&Ox4, maew  LABS:  CBC Recent Labs     04/22/13  1651  WBC  8.6  HGB  12.5  HCT  36.5  PLT  127*   Coag's No results found for this basename: APTT, INR,  in the last 72 hours BMET Recent Labs     04/22/13  1651  NA  137  K  3.5  CL  98  CO2  26  BUN  11  CREATININE  1.06  GLUCOSE  128*   Electrolytes Recent Labs     04/22/13  1651  CALCIUM  8.9   Sepsis Markers No results found for this basename: LACTICACIDVEN, PROCALCITON, O2SATVEN,  in the last 72 hours ABG No results found for this basename: PHART, PCO2ART, PO2ART,  in the last 72 hours Liver Enzymes Recent Labs     04/22/13  1651  AST  44*  ALT  26  ALKPHOS  56  BILITOT  0.5  ALBUMIN  3.3*   Cardiac Enzymes No results found for this basename: TROPONINI, PROBNP,  in the last 72 hours Glucose No results found for this basename: GLUCAP,  in the last 72 hours     CXR: trach tube in place, no infiltrate  ASSESSMENT / PLAN:  PULMONARY A: Obesity hypoventilation syndrome Asthma Chronic nocturnal vent through trach  P:   -continue pulmicort bid -prn duoneb -will attempt home vent settings > SIMV Rate 12, TVol 510cc, PS 15, PEEP 5, FiO2 30% -if intolerant of vent (as she has been in past), then OK to use humidified O2 alone  INFECTIOUS A:  Pyelonephritis P:   -per [facility]  Laaibah Wartman,MD Pulmonary and Critical Care Medicine [facility] Pager: [phone]  04/22/2013, 10:45 PM

## 2013-04-22 NOTE — ED Provider Notes (Signed)
Medical screening examination/treatment/procedure(s) were conducted as a shared visit with non-physician practitioner(s) and myself.  I personally evaluated the patient during the encounter.  EKG Interpretation   None       Pt is febrile, had transient hypotension in the ED, however lactic acid is normal.  Meets sepsis and SIRS criteria. Has been on oral outpt abx for 24 hours, seems to be worsening.  Pt also with morbid obesity, DM and other systemic risks for rapid decompensation, would be better served inpatient in my opinion with supportive care, IVF's, and monitoring as well as IV abx.  Pt's sats are ok on trach collar O2, baseline mentation, no evidence of active hypoventilation.    Gavin Pound. Oletta Lamas, MD 04/22/13 2022

## 2013-04-22 NOTE — ED Notes (Signed)
Patient is resting comfortably. 

## 2013-04-22 NOTE — H&P (Signed)
PCP:  Astrid Divine, MD    Chief Complaint:  fever  HPI: [name] is a 52 y.o. female   has a past medical history of OSA (obstructive sleep apnea); Morbid obesity; Depressive disorder, not elsewhere classified; Diabetes mellitus; Asthma; Dyslipidemia; Clostridium difficile enterocolitis (2012); Splenomegaly (06/01/2011); Thrombocytopenia (06/01/2011); Hepatosplenomegaly; Gout; GERD (gastroesophageal reflux disease); Osteoarthritis; Polyneuropathy; Urinary incontinence; Amenorrhea; CHF (congestive heart failure); Hyperlipidemia; Leg swelling; Nausea; Weakness; Palpitations; PONV (postoperative nausea and vomiting); Anginal pain; Dysrhythmia; Anxiety; Pneumonia; Shortness of breath; Chronic kidney disease; and Peripheral neuropathy.   Presented with  3 day hx of urinary symptoms, urgency and frequency, foul urine and fever up 102. She went to her PCP yesterday and was started on Macrobid for UTI. Her symptoms have persisted and she was directed to go to ER. In ER she was febrile up to 102 transiently hypotensive to 97/42 although accuracy is questionable.  She has had some severe back pain and nausea no vomiting. Hospitalist was called ofr admission for pyelonephritis. Patient states she is vent dependent at night for hx of sleep apnea due to morbid obesity now sp tracheostomy followed by Dr. Heloise Purpura. She is on coumadin chronically for hx of a.fib.  Review of Systems:    Pertinent positives include:Fevers, chills, fatigue,  nausea, Left leg chronic swelling, back pain  Constitutional:  No weight loss, night sweats, weight loss  HEENT:  No headaches, Difficulty swallowing,Tooth/dental problems,Sore throat,  No sneezing, itching, ear ache, nasal congestion, post nasal drip,  Cardio-vascular:  No chest pain, Orthopnea, PND, anasarca, dizziness, palpitations.no Bilateral lower extremity swelling  GI:  No heartburn, indigestion, abdominal pain,vomiting, diarrhea, change in bowel  habits, loss of appetite, melena, blood in stool, hematemesis Resp:  no shortness of breath at rest. No dyspnea on exertion, No excess mucus, no productive cough, No non-productive cough, No coughing up of blood.No change in color of mucus.No wheezing. Skin:  no rash or lesions. No jaundice GU:  no dysuria, change in color of urine, no urgency or frequency. No straining to urinate.  No flank pain.  Musculoskeletal:  No joint pain or no joint swelling. No decreased range of motion. No back pain.  Psych:  No change in mood or affect. No depression or anxiety. No memory loss.  Neuro: no localizing neurological complaints, no tingling, no weakness, no double vision, no gait abnormality, no slurred speech, no confusion  Otherwise ROS are negative except for above, 10 systems were reviewed  Past Medical History: Past Medical History  Diagnosis Date  . OSA (obstructive sleep apnea)   . Morbid obesity   . Depressive disorder, not elsewhere classified   . Diabetes mellitus   . Asthma   . Dyslipidemia   . Clostridium difficile enterocolitis 2012  . Splenomegaly 06/01/2011  . Thrombocytopenia 06/01/2011  . Hepatosplenomegaly   . Gout   . GERD (gastroesophageal reflux disease)   . Osteoarthritis   . Polyneuropathy   . Urinary incontinence   . Amenorrhea   . CHF (congestive heart failure)   . Hyperlipidemia   . Leg swelling   . Nausea   . Weakness   . Palpitations   . PONV (postoperative nausea and vomiting)   . Anginal pain     no chest pain in years  . Dysrhythmia     heart palpations  . Anxiety   . Pneumonia   . Shortness of breath   . Chronic kidney disease     overactive bladder  . Peripheral neuropathy  Past Surgical History  Procedure Laterality Date  . Cholecystectomy    . Tracheostomy    . Endometrial ablation    . Skin graft    . Finger surgery      ring finger on right hand  . Esophageal dilation  2003  . Breast biopsy      needle core, left  . Dilation  and curettage of uterus      times 2  . Tracheal dilitation  01/24/2012    Procedure: TRACHEAL DILITATION;  Surgeon: [name], MD;  Location: [facility] OR;  Service: ENT;  Laterality: N/A;  Trache change with possible dilation, from size 6 to size 7 uncuffed     Medications: Prior to Admission medications   Medication Sig Start Date End Date Taking? Authorizing Provider  acetaminophen (TYLENOL) 500 MG tablet Take 1,000 mg by mouth every 6 (six) hours as needed. For pain   Yes Historical Provider, MD  albuterol (PROVENTIL) (2.5 MG/3ML) 0.083% nebulizer solution Take 2.5 mg by nebulization every 4 (four) hours as needed. For shortness of breath 05/28/11  Yes Coralyn Helling, MD  ALPRAZolam Prudy Feeler) 0.5 MG tablet Take 0.5 mg by mouth 2 (two) times daily as needed. For anxiety   Yes Historical Provider, MD  aspirin 81 MG chewable tablet Chew 81 mg by mouth daily.   Yes Historical Provider, MD  atorvastatin (LIPITOR) 10 MG tablet Take 10 mg by mouth at bedtime.    Yes Historical Provider, MD  B Complex Vitamins (VITAMIN B COMPLEX PO) Take 1 tablet by mouth daily.    Yes Historical Provider, MD  budesonide (PULMICORT) 0.25 MG/2ML nebulizer solution Take 0.25 mg by nebulization 2 (two) times daily as needed. For shortness of breath. 05/28/11  Yes Coralyn Helling, MD  bumetanide (BUMEX) 2 MG tablet Take 2 mg by mouth daily.  12/31/12  Yes Mihai Croitoru, MD  busPIRone (BUSPAR) 5 MG tablet Take 7.5 mg by mouth 2 (two) times daily.    Yes Historical Provider, MD  Canagliflozin (INVOKANA) 100 MG TABS Take 100 mg by mouth daily.    Yes Historical Provider, MD  cholestyramine Lanetta Inch) 4 G packet Take 1 packet by mouth as needed. For loose stools, IBS.   Yes Historical Provider, MD  clotrimazole (LOTRIMIN) 1 % cream Apply 1 application topically daily. To stomach   Yes Historical Provider, MD  colchicine 0.6 MG tablet Take 0.6 mg by mouth daily.   Yes Historical Provider, MD  DULoxetine (CYMBALTA) 30 MG capsule Take 30 mg by  mouth 2 (two) times daily.   Yes Historical Provider, MD  ergocalciferol (VITAMIN D2) 50000 UNITS capsule Take 50,000 Units by mouth once a week. Take on Fridays   Yes Historical Provider, MD  Febuxostat (ULORIC) 80 MG TABS Take 80 mg by mouth at bedtime.    Yes Historical Provider, MD  fesoterodine (TOVIAZ) 4 MG TB24 Take 4 mg by mouth daily.     Yes Historical Provider, MD  fish oil-omega-3 fatty acids 1000 MG capsule Take 1 g by mouth 2 (two) times daily.    Yes Historical Provider, MD  gabapentin (NEURONTIN) 100 MG capsule Take 1 capsule (100 mg total) by mouth 4 (four) times daily. 04/16/13  Yes Huston Foley, MD  guaiFENesin (MUCINEX) 600 MG 12 hr tablet Take 600 mg by mouth 2 (two) times daily.    Yes Historical Provider, MD  HYDROcodone-acetaminophen (NORCO/VICODIN) 5-325 MG per tablet Take 1 tablet by mouth every 6 (six) hours as needed for pain.  Yes Historical Provider, MD  insulin regular human CONCENTRATED (HUMULIN R) 500 UNIT/ML SOLN injection Inject 28 Units into the skin 2 (two) times daily before a meal.    Yes Historical Provider, MD  ipratropium (ATROVENT) 0.02 % nebulizer solution Take 2.5 mLs (500 mcg total) by nebulization 4 (four) times daily. 03/27/12  Yes Coralyn Helling, MD  Liraglutide (VICTOZA) 18 MG/3ML SOLN injection Inject 1.8 mg into the skin daily.   Yes Historical Provider, MD  loratadine (CLARITIN) 10 MG tablet Take 10 mg by mouth daily as needed. For allergies   Yes Historical Provider, MD  losartan (COZAAR) 100 MG tablet Take 100 mg by mouth daily.   Yes Historical Provider, MD  meclizine (ANTIVERT) 25 MG tablet Take 25 mg by mouth at bedtime.    Yes Historical Provider, MD  metolazone (ZAROXOLYN) 5 MG tablet Take 5 mg by mouth See admin instructions. 4 times weekly as needed for edema   Yes Historical Provider, MD  Multiple Vitamin (MULTIVITAMIN) capsule Take 1 capsule by mouth daily.     Yes Historical Provider, MD  nebivolol (BYSTOLIC) 10 MG tablet Take 10 mg by  mouth daily.   Yes Historical Provider, MD  nitrofurantoin, macrocrystal-monohydrate, (MACROBID) 100 MG capsule Take 100 mg by mouth 2 (two) times daily. 04/21/13  Yes Historical Provider, MD  omeprazole (PRILOSEC) 40 MG capsule Take 40 mg by mouth 2 (two) times daily.    Yes Historical Provider, MD  potassium chloride SA (K-DUR,KLOR-CON) 20 MEQ tablet Take 40 mEq by mouth 3 (three) times daily.    Yes Historical Provider, MD  primidone (MYSOLINE) 50 MG tablet Take 25-75 mg by mouth 2 (two) times daily. 25mg  in the morning; 75mg  in the evening   Yes Historical Provider, MD  PROBIOTIC CAPS Take 1 capsule by mouth 2 (two) times daily.    Yes Historical Provider, MD  promethazine (PHENERGAN) 25 MG tablet Take 25 mg by mouth every 6 (six) hours as needed. For nausea/vomiting   Yes Historical Provider, MD  pyridOXINE (VITAMIN B-6) 100 MG tablet Take 100 mg by mouth daily.   Yes Historical Provider, MD  topiramate (TOPAMAX) 50 MG tablet Take 1 tablet (50 mg total) by mouth daily. 03/05/13  Yes Levert Feinstein, MD  warfarin (COUMADIN) 5 MG tablet Take 5-7.5 mg by mouth daily. Take 1 tablet (5mg ) on Monday, Tuesday, Thursday, Friday.   Take 1.5 tablet (7.5mg ) on Sunday, Wednesday, and saturday   Yes Historical Provider, MD    Allergies:   Allergies  Allergen Reactions  . Molds & Smuts Shortness Of Breath and Rash  . Nitroglycerin Er Nausea And Vomiting  . Accolate [Zafirlukast] Other (See Comments)    Reaction unknown  . Allopurinol Other (See Comments)    GI problems   . Ciprofloxacin Other (See Comments)    GI problems  . Codeine Other (See Comments)    Heart problems  . Doxycycline Other (See Comments)    Gastric problems  . Fexofenadine Other (See Comments)    Hurts joints/pain  . Morphine Other (See Comments)    unknown  . Sulfa Drugs Cross Reactors Nausea And Vomiting    nausea  . Sulfonamide Derivatives Nausea Only    Social History:  Ambulatory   walker   Lives at  Home alone    reports that she has never smoked. She has never used smokeless tobacco. She reports that she does not drink alcohol or use illicit drugs.   Family History: family history includes Cancer  in her cousin, paternal aunt, and paternal grandmother; Clotting disorder in her mother; Dementia in her father; Diabetes in her father; Heart attack in her mother; Heart disease in her mother and paternal grandfather; Hypertension in her father, mother, and sister; Kidney disease in her maternal grandmother; Multiple myeloma in her paternal grandmother; Pneumonia in her father.    Physical Exam: Patient Vitals for the past 24 hrs:  BP Temp Temp src Pulse Resp SpO2  04/22/13 2032 103/55 mmHg 100.2 F (37.9 C) Oral 72 18 98 %  04/22/13 2000 111/47 mmHg - - 73 21 97 %  04/22/13 1900 121/56 mmHg - - 74 18 100 %  04/22/13 1847 120/58 mmHg 102 F (38.9 C) Oral 75 20 100 %  04/22/13 1815 126/63 mmHg - - 74 19 100 %  04/22/13 1800 125/59 mmHg - - 77 20 100 %  04/22/13 1700 105/54 mmHg - - 80 20 98 %  04/22/13 1645 97/42 mmHg - - 81 26 97 %  04/22/13 1644 107/49 mmHg 102.2 F (39 C) Oral 80 19 97 %    1. General:  in No Acute distress 2. Psychological: Alert and   Oriented 3. Head/ENT:   Moist   Mucous Membranes                          Head Non traumatic, neck supple                          Normal  Dentition 4. SKIN: normal  Skin turgor,  Skin clean Dry and intact no rash 5. Heart: Regular rate and rhythm no Murmur, Rub or gallop 6. Lungs: no wheezes or crackles  Difficult to assess given morbid obesity  7. Abdomen: Soft, non-tender, Non distended, severely obese 8. Lower extremities: no clubbing, cyanosis,  no pitting  Edema. Morbidly obese  9. Neurologically Grossly intact, moving all 4 extremities equally 10. MSK: Normal range of motion  body mass index is unknown because there is no weight on file.   Labs on Admission:   Recent Labs  04/22/13 1651  NA 137  K 3.5  CL 98  CO2 26  GLUCOSE  128*  BUN 11  CREATININE 1.06  CALCIUM 8.9    Recent Labs  04/22/13 1651  AST 44*  ALT 26  ALKPHOS 56  BILITOT 0.5  PROT 8.0  ALBUMIN 3.3*   No results found for this basename: LIPASE, AMYLASE,  in the last 72 hours  Recent Labs  04/22/13 1651  WBC 8.6  NEUTROABS 5.9  HGB 12.5  HCT 36.5  MCV 94.3  PLT 127*   No results found for this basename: CKTOTAL, CKMB, CKMBINDEX, TROPONINI,  in the last 72 hours No results found for this basename: TSH, T4TOTAL, FREET3, T3FREE, THYROIDAB,  in the last 72 hours No results found for this basename: VITAMINB12, FOLATE, FERRITIN, TIBC, IRON, RETICCTPCT,  in the last 72 hours Lab Results  Component Value Date   HGBA1C 8.9* 08/08/2012    The CrCl is unknown because both a height and weight (above a minimum accepted value) are required for this calculation. ABG    Component Value Date/Time   PHART 7.381 02/06/2007 0919   HCO3 27.7* 02/06/2007 0919   TCO2 28 03/31/2009 1544   O2SAT 86.8 02/06/2007 0919     Lab Results  Component Value Date   DDIMER  Value: 0.76  AT THE INHOUSE ESTABLISHED CUTOFF VALUE OF 0.48 ug/mL FEU, THIS ASSAY HAS BEEN DOCUMENTED IN THE LITERATURE TO HAVE* 02/06/2007     UA 3-6 WBC nitrite positive   Cultures:    Component Value Date/Time   SDES URINE, CATHETERIZED 08/08/2012 1518   SPECREQUEST ADDED AT 1548 ON [id number] 08/08/2012 1518   CULT NO GROWTH 08/08/2012 1518   REPTSTATUS 08/09/2012 FINAL 08/08/2012 1518       Radiological Exams on Admission: Dg Chest Port 1 View  (if Code Sepsis Called)  04/22/2013   CLINICAL DATA:  Sepsis, CHF, hypertension, diabetes  EXAM: PORTABLE CHEST - 1 VIEW  COMPARISON:  08/08/2012  FINDINGS: Tracheostomy 5 cm above the carina. Cardiomegaly evident with vascular congestion and basilar atelectasis. No definite CHF or pneumonia. No effusion or pneumothorax.  IMPRESSION: Stable cardiomegaly and vascular congestion. No significant interval change.   Electronically Signed   By:  Ruel Favors [name]   On: 04/22/2013 17:16    Chart has been reviewed  Assessment/Plan  52 year old female with history of obesity hypoventilation syndrome status post tracheostomy on ventilator at night. As well as history of hypertension and diabetes here with likely SIRS due to pyelonephritis.  Present on Admission:  . SIRS (systemic inflammatory response syndrome) - blood pressure as unreliable again it has been checked on her foot but she does not appear to be toxic at this point we'll continue IV antibiotics hold her blood pressure meds and diuretics for tonight he blood pressure stabilizes or increases would restart tomorrow  . OSA (obstructive sleep apnea) - spoke with pulmonology who will see patient and help [name] with drusen correct ventilator settings for her night  . Diabetes mellitus type 2 in obese - while in the hospital will put her on Lantus 30 units at night and sliding scale  . Pyelonephritis - Rocephin await results of urine and blood cultures obtained renal ultrasound Morbid obesity - this is chronic results and in complications such as hypoventilation syndrome diabetes hypertension. This is the further addressed on outpatient basis History of atrial fibrillation will monitor on telemetry continue Coumadin and bisoprolol  Prophylaxis: on coumadin Protonix  CODE STATUS: FULL CODE  Other plan as per orders.  I have spent a total of 65 min on this admission, Time taken to discuss care with pulmonology  [facility] 04/22/2013, 8:49 PM

## 2013-04-22 NOTE — ED Provider Notes (Signed)
CSN: [id number]     Arrival date & time 04/22/13  1632 History   First MD Initiated Contact with Patient 04/22/13 1644     Chief Complaint  Patient presents with  . Code Sepsis  . Generalized Body Aches   (Consider location/radiation/quality/duration/timing/severity/associated sxs/prior Treatment) HPI Comments: Patient with a history of OSA, DM, and obesity hypoventilation syndrome who currently has a trach in place presents today with a chief complain to bilateral flank pain, increased urinary urgency, and urinary frequency.  She reports that her symptoms have been present for the past two days.  She has also had a fever for the past 2 days.  She reports that her symptoms are similar to when she has had a kidney infection in the past.  When asked about urinary incontinence she states that she has the urgency to urinate and is unable to make it to the restroom quick enough.  She states that she was seen by her PCP yesterday for these symptoms.  She was diagnosed with a UTI and was given a prescription for Macrobid, which she began taking yesterday.  She denies nausea or vomiting.  She is also complaining of a headache that has been present for the past two days.  Denies any stiffness in the neck.  She also reports that she has been having some shortness of breath for the past couple of days.  She denies chest pain.  She reports that her cough is "minimal." and non productive.  The history is provided by the patient.    Past Medical History  Diagnosis Date  . OSA (obstructive sleep apnea)   . Morbid obesity   . Depressive disorder, not elsewhere classified   . Diabetes mellitus   . Asthma   . Dyslipidemia   . Clostridium difficile enterocolitis 2012  . Splenomegaly 06/01/2011  . Thrombocytopenia 06/01/2011  . Hepatosplenomegaly   . Gout   . GERD (gastroesophageal reflux disease)   . Osteoarthritis   . Polyneuropathy   . Urinary incontinence   . Amenorrhea   . CHF (congestive heart  failure)   . Hyperlipidemia   . Leg swelling   . Nausea   . Weakness   . Palpitations   . PONV (postoperative nausea and vomiting)   . Anginal pain     no chest pain in years  . Dysrhythmia     heart palpations  . Anxiety   . Pneumonia   . Shortness of breath   . Chronic kidney disease     overactive bladder  . Peripheral neuropathy    Past Surgical History  Procedure Laterality Date  . Cholecystectomy    . Tracheostomy    . Endometrial ablation    . Skin graft    . Finger surgery      ring finger on right hand  . Esophageal dilation  2003  . Breast biopsy      needle core, left  . Dilation and curettage of uterus      times 2  . Tracheal dilitation  01/24/2012    Procedure: TRACHEAL DILITATION;  Surgeon: [name], MD;  Location: [facility] OR;  Service: ENT;  Laterality: N/A;  Trache change with possible dilation, from size 6 to size 7 uncuffed   Family History  Problem Relation Age of Onset  . Diabetes Father   . Hypertension Father   . Dementia Father   . Pneumonia Father   . Heart disease Mother   . Clotting disorder Mother   .  Hypertension Mother   . Heart attack Mother   . Multiple myeloma Paternal Grandmother   . Cancer Paternal Grandmother     multiple myoloma  . Heart disease Paternal Grandfather   . Kidney disease Maternal Grandmother   . Hypertension Sister   . Cancer Paternal Aunt     breast  . Cancer Cousin     breast - all three   History  Substance Use Topics  . Smoking status: Never Smoker   . Smokeless tobacco: Never Used  . Alcohol Use: No     Comment: occasional - once or twice a year   OB History   Grav Para Term Preterm Abortions TAB SAB Ect Mult Living   0              Review of Systems  All other systems reviewed and are negative.    Allergies  Molds & smuts; Nitroglycerin er; Accolate; Allopurinol; Ciprofloxacin; Codeine; Doxycycline; Fexofenadine; Morphine; Sulfa drugs cross reactors; and Sulfonamide derivatives  Home  Medications   Current Outpatient Rx  Name  Route  Sig  Dispense  Refill  . acetaminophen (TYLENOL) 500 MG tablet   Oral   Take 1,000 mg by mouth every 6 (six) hours as needed. For pain         . albuterol (PROVENTIL) (2.5 MG/3ML) 0.083% nebulizer solution   Nebulization   Take 2.5 mg by nebulization every 4 (four) hours as needed. For shortness of breath         . ALPRAZolam (XANAX) 0.5 MG tablet   Oral   Take 0.5 mg by mouth 2 (two) times daily as needed. For anxiety         . aspirin 81 MG chewable tablet   Oral   Chew 81 mg by mouth daily.         [name] atorvastatin (LIPITOR) 10 MG tablet   Oral   Take 10 mg by mouth at bedtime.          . B Complex Vitamins (VITAMIN B COMPLEX PO)   Oral   Take 1 tablet by mouth daily.          . budesonide (PULMICORT) 0.25 MG/2ML nebulizer solution   Nebulization   Take 0.25 mg by nebulization 2 (two) times daily as needed. For shortness of breath.         . bumetanide (BUMEX) 2 MG tablet   Oral   Take 2 mg by mouth daily.          . busPIRone (BUSPAR) 5 MG tablet   Oral   Take 7.5 mg by mouth 2 (two) times daily.          . Canagliflozin (INVOKANA) 100 MG TABS   Oral   Take 100 mg by mouth daily.          . cholestyramine (QUESTRAN) 4 G packet   Oral   Take 1 packet by mouth as needed. For loose stools, IBS.         . clotrimazole (LOTRIMIN) 1 % cream   Topical   Apply 1 application topically daily. To stomach         . colchicine 0.6 MG tablet   Oral   Take 0.6 mg by mouth daily.         . DULoxetine (CYMBALTA) 30 MG capsule   Oral   Take 30 mg by mouth 2 (two) times daily.         . ergocalciferol (VITAMIN D2)  50000 UNITS capsule   Oral   Take 50,000 Units by mouth once a week. Take on Fridays         . Febuxostat (ULORIC) 80 MG TABS   Oral   Take 80 mg by mouth at bedtime.          . fesoterodine (TOVIAZ) 4 MG TB24   Oral   Take 4 mg by mouth daily.           . fish  oil-omega-3 fatty acids 1000 MG capsule   Oral   Take 1 g by mouth 2 (two) times daily.          [name] gabapentin (NEURONTIN) 100 MG capsule   Oral   Take 1 capsule (100 mg total) by mouth 4 (four) times daily.   120 capsule   0   . guaiFENesin (MUCINEX) 600 MG 12 hr tablet   Oral   Take 600 mg by mouth 2 (two) times daily.          [name] HYDROcodone-acetaminophen (NORCO/VICODIN) 5-325 MG per tablet   Oral   Take 1 tablet by mouth every 6 (six) hours as needed for pain.         [name] insulin regular human CONCENTRATED (HUMULIN R) 500 UNIT/ML SOLN injection   Subcutaneous   Inject 140 Units into the skin 2 (two) times daily before a meal.          . ipratropium (ATROVENT) 0.02 % nebulizer solution   Nebulization   Take 2.5 mLs (500 mcg total) by nebulization 4 (four) times daily.   75 mL   12   . Liraglutide (VICTOZA) 18 MG/3ML SOLN injection   Subcutaneous   Inject 1.8 mg into the skin daily.         [name] loratadine (CLARITIN) 10 MG tablet   Oral   Take 10 mg by mouth daily as needed. For allergies         . losartan (COZAAR) 100 MG tablet   Oral   Take 100 mg by mouth daily.         . meclizine (ANTIVERT) 25 MG tablet   Oral   Take 25 mg by mouth at bedtime.          . metolazone (ZAROXOLYN) 5 MG tablet   Oral   Take 5 mg by mouth See admin instructions. 4 times weekly as needed for edema         . Multiple Vitamin (MULTIVITAMIN) capsule   Oral   Take 1 capsule by mouth daily.           . nebivolol (BYSTOLIC) 10 MG tablet   Oral   Take 10 mg by mouth daily.         . nitrofurantoin, macrocrystal-monohydrate, (MACROBID) 100 MG capsule   Oral   Take 100 mg by mouth 2 (two) times daily.         [name] omeprazole (PRILOSEC) 40 MG capsule   Oral   Take 40 mg by mouth 2 (two) times daily.          . potassium chloride SA (K-DUR,KLOR-CON) 20 MEQ tablet   Oral   Take 40 mEq by mouth 3 (three) times daily.          . primidone (MYSOLINE) 50 MG  tablet   Oral   Take 25-75 mg by mouth 2 (two) times daily. 25mg  in the morning; 75mg  in the evening         .  PROBIOTIC CAPS   Oral   Take 1 capsule by mouth 2 (two) times daily.          . promethazine (PHENERGAN) 25 MG tablet   Oral   Take 25 mg by mouth every 6 (six) hours as needed. For nausea/vomiting         . pyridOXINE (VITAMIN B-6) 100 MG tablet   Oral   Take 100 mg by mouth daily.         [name] topiramate (TOPAMAX) 50 MG tablet   Oral   Take 1 tablet (50 mg total) by mouth daily.   30 tablet   0   . warfarin (COUMADIN) 5 MG tablet   Oral   Take 5-7.5 mg by mouth daily. Take 1 tablet (5mg ) on Monday, Tuesday, Thursday, Friday.   Take 1.5 tablet (7.5mg ) on Sunday, Wednesday, and saturday          BP 120/58  Pulse 75  Temp(Src) 102 F (38.9 C) (Oral)  Resp 20  SpO2 100% Physical Exam  Nursing note and vitals reviewed. Constitutional: She appears well-developed and well-nourished. No distress.  Patient morbidly obese  HENT:  Head: Normocephalic and atraumatic.  Mouth/Throat: Oropharynx is clear and moist.  Neck: Normal range of motion. Neck supple.  Cardiovascular: Normal rate, regular rhythm and normal heart sounds.   Pulmonary/Chest: Effort normal and breath sounds normal.  Abdominal: Soft. Bowel sounds are normal. There is no tenderness. There is CVA tenderness. There is no rebound and no guarding.  Bilateral CVA tenderness  Neurological: She is alert.  Skin: Skin is warm and dry. She is not diaphoretic.  Psychiatric: She has a normal mood and affect.    ED Course  Procedures (including critical care time) Labs Review Labs Reviewed  CBC WITH DIFFERENTIAL - Abnormal; Notable for the following:    Platelets 127 (*)    Monocytes Relative 17 (*)    Monocytes Absolute 1.5 (*)    All other components within normal limits  COMPREHENSIVE METABOLIC PANEL - Abnormal; Notable for the following:    Glucose, Bld 128 (*)    Albumin 3.3 (*)    AST 44  (*)    GFR calc non Af Amer 60 (*)    GFR calc Af Amer 70 (*)    All other components within normal limits  CULTURE, BLOOD (ROUTINE X 2)  CULTURE, BLOOD (ROUTINE X 2)  URINE CULTURE  URINALYSIS, ROUTINE W REFLEX MICROSCOPIC  CG4 I-STAT (LACTIC ACID)   Imaging Review Dg Chest Port 1 View  (if Code Sepsis Called)  04/22/2013   CLINICAL DATA:  Sepsis, CHF, hypertension, diabetes  EXAM: PORTABLE CHEST - 1 VIEW  COMPARISON:  08/08/2012  FINDINGS: Tracheostomy 5 cm above the carina. Cardiomegaly evident with vascular congestion and basilar atelectasis. No definite CHF or pneumonia. No effusion or pneumothorax.  IMPRESSION: Stable cardiomegaly and vascular congestion. No significant interval change.   Electronically Signed   By: Ruel Favors [name]   On: 04/22/2013 17:16    EKG Interpretation   None       MDM  No diagnosis found. Patient presents to the ED today with bilateral flank pain, fever, increased urinary frequency and urgency.  She was seen by her PCP for this yesterday and started on Macrobid, which she has been taking.  Patient febrile upon arrival in the ED.  Lactate WNL.  Blood cultures were sent.  No acute findings on CXR.  Urine did show UTI.  Therefore, feel that symptoms  are most likely related to Pyelonephritis.  Patient given IV Rocephin in the ED.  Due to the fact that the patient is diabetic and also has been on Macrobid with worsening symptoms, feel that the patient requires admission.  [facility] has agreed to admit the patient.    [name], PA-C 04/25/13 [phone]

## 2013-04-23 ENCOUNTER — Inpatient Hospital Stay (HOSPITAL_COMMUNITY): Payer: Medicare Other

## 2013-04-23 DIAGNOSIS — E669 Obesity, unspecified: Secondary | ICD-10-CM

## 2013-04-23 DIAGNOSIS — E119 Type 2 diabetes mellitus without complications: Secondary | ICD-10-CM

## 2013-04-23 LAB — GLUCOSE, CAPILLARY
Glucose-Capillary: 153 mg/dL — ABNORMAL HIGH (ref 70–99)
Glucose-Capillary: 213 mg/dL — ABNORMAL HIGH (ref 70–99)
Glucose-Capillary: 188 mg/dL — ABNORMAL HIGH (ref 70–99)
Glucose-Capillary: 193 mg/dL — ABNORMAL HIGH (ref 70–99)
Glucose-Capillary: 202 mg/dL — ABNORMAL HIGH (ref 70–99)

## 2013-04-23 LAB — COMPREHENSIVE METABOLIC PANEL
AST: 29 U/L (ref 0–37)
Albumin: 2.9 g/dL — ABNORMAL LOW (ref 3.5–5.2)
Calcium: 8.5 mg/dL (ref 8.4–10.5)
Creatinine, Ser: 1.12 mg/dL — ABNORMAL HIGH (ref 0.50–1.10)
Sodium: 136 mEq/L (ref 135–145)

## 2013-04-23 LAB — URINE CULTURE: Culture: NO GROWTH

## 2013-04-23 LAB — CBC
HCT: 32.8 % — ABNORMAL LOW (ref 36.0–46.0)
Hemoglobin: 11 g/dL — ABNORMAL LOW (ref 12.0–15.0)
MCH: 31.3 pg (ref 26.0–34.0)
MCHC: 33.5 g/dL (ref 30.0–36.0)
MCV: 93.2 fL (ref 78.0–100.0)
RBC: 3.52 MIL/uL — ABNORMAL LOW (ref 3.87–5.11)
RDW: 15.2 % (ref 11.5–15.5)

## 2013-04-23 LAB — MRSA PCR SCREENING: MRSA by PCR: POSITIVE — AB

## 2013-04-23 LAB — TSH: TSH: 1.657 u[IU]/mL (ref 0.350–4.500)

## 2013-04-23 LAB — MAGNESIUM: Magnesium: 1.8 mg/dL (ref 1.5–2.5)

## 2013-04-23 LAB — PROTIME-INR
INR: 1.91 — ABNORMAL HIGH (ref 0.00–1.49)
INR: 1.96 — ABNORMAL HIGH (ref 0.00–1.49)

## 2013-04-23 LAB — PHOSPHORUS: Phosphorus: 4.4 mg/dL (ref 2.3–4.6)

## 2013-04-23 MED ORDER — GUAIFENESIN ER 600 MG PO TB12
600.0000 mg | ORAL_TABLET | Freq: Two times a day (BID) | ORAL | Status: DC
  Administered 2013-04-23 – 2013-04-26 (×8): 600 mg via ORAL
  Filled 2013-04-23 (×10): qty 1

## 2013-04-23 MED ORDER — INSULIN ASPART 100 UNIT/ML ~~LOC~~ SOLN
0.0000 [IU] | Freq: Every day | SUBCUTANEOUS | Status: DC
  Administered 2013-04-23 – 2013-04-25 (×2): 2 [IU] via SUBCUTANEOUS

## 2013-04-23 MED ORDER — ACETAMINOPHEN 325 MG PO TABS
650.0000 mg | ORAL_TABLET | Freq: Four times a day (QID) | ORAL | Status: DC | PRN
  Administered 2013-04-23: 650 mg via ORAL
  Filled 2013-04-23: qty 2

## 2013-04-23 MED ORDER — WARFARIN SODIUM 5 MG PO TABS
5.0000 mg | ORAL_TABLET | ORAL | Status: DC
  Filled 2013-04-23: qty 1

## 2013-04-23 MED ORDER — ONDANSETRON HCL 4 MG/2ML IJ SOLN: 4.0000 mg | Freq: Four times a day (QID) | INTRAMUSCULAR | Status: DC | PRN

## 2013-04-23 MED ORDER — WARFARIN - PHARMACIST DOSING INPATIENT
Freq: Every day | Status: DC
  Administered 2013-04-23: 1

## 2013-04-23 MED ORDER — SODIUM CHLORIDE 0.9 % IJ SOLN
3.0000 mL | Freq: Two times a day (BID) | INTRAMUSCULAR | Status: DC
  Administered 2013-04-23 – 2013-04-24 (×4): 3 mL via INTRAVENOUS

## 2013-04-23 MED ORDER — ASPIRIN 81 MG PO CHEW
81.0000 mg | CHEWABLE_TABLET | Freq: Every day | ORAL | Status: DC
  Administered 2013-04-23 – 2013-04-26 (×4): 81 mg via ORAL
  Filled 2013-04-23 (×4): qty 1

## 2013-04-23 MED ORDER — FESOTERODINE FUMARATE ER 4 MG PO TB24
4.0000 mg | ORAL_TABLET | Freq: Every day | ORAL | Status: DC
Start: 2013-04-23 — End: 2013-04-26
  Administered 2013-04-23 – 2013-04-26 (×4): 4 mg via ORAL
  Filled 2013-04-23 (×4): qty 1

## 2013-04-23 MED ORDER — WARFARIN SODIUM 7.5 MG PO TABS
7.5000 mg | ORAL_TABLET | Freq: Once | ORAL | Status: AC
  Administered 2013-04-23: 7.5 mg via ORAL
  Filled 2013-04-23: qty 1

## 2013-04-23 MED ORDER — GABAPENTIN 100 MG PO CAPS
100.0000 mg | ORAL_CAPSULE | Freq: Four times a day (QID) | ORAL | Status: DC
  Administered 2013-04-23 – 2013-04-26 (×16): 100 mg via ORAL
  Filled 2013-04-23 (×17): qty 1

## 2013-04-23 MED ORDER — PRIMIDONE 50 MG PO TABS
75.0000 mg | ORAL_TABLET | Freq: Every day | ORAL | Status: DC
  Administered 2013-04-23 – 2013-04-25 (×4): 75 mg via ORAL
  Filled 2013-04-23 (×5): qty 1.5

## 2013-04-23 MED ORDER — DOCUSATE SODIUM 100 MG PO CAPS
100.0000 mg | ORAL_CAPSULE | Freq: Two times a day (BID) | ORAL | Status: DC
  Administered 2013-04-23 – 2013-04-24 (×3): 100 mg via ORAL
  Filled 2013-04-23 (×9): qty 1

## 2013-04-23 MED ORDER — ACETAMINOPHEN 650 MG RE SUPP: 650.0000 mg | Freq: Four times a day (QID) | RECTAL | Status: DC | PRN

## 2013-04-23 MED ORDER — SODIUM CHLORIDE 0.9 % IJ SOLN: 3.0000 mL | INTRAMUSCULAR | Status: DC | PRN

## 2013-04-23 MED ORDER — INSULIN GLARGINE 100 UNIT/ML ~~LOC~~ SOLN
35.0000 [IU] | Freq: Every day | SUBCUTANEOUS | Status: DC
  Administered 2013-04-23: 35 [IU] via SUBCUTANEOUS
  Filled 2013-04-23 (×2): qty 0.35

## 2013-04-23 MED ORDER — DULOXETINE HCL 30 MG PO CPEP
30.0000 mg | ORAL_CAPSULE | Freq: Two times a day (BID) | ORAL | Status: DC
  Administered 2013-04-23 – 2013-04-26 (×8): 30 mg via ORAL
  Filled 2013-04-23 (×10): qty 1

## 2013-04-23 MED ORDER — CANAGLIFLOZIN 100 MG PO TABS
100.0000 mg | ORAL_TABLET | Freq: Every day | ORAL | Status: DC
  Administered 2013-04-24 – 2013-04-26 (×3): 100 mg via ORAL
  Filled 2013-04-23 (×4): qty 1

## 2013-04-23 MED ORDER — NEBIVOLOL HCL 10 MG PO TABS
10.0000 mg | ORAL_TABLET | Freq: Every day | ORAL | Status: DC
  Administered 2013-04-23 – 2013-04-26 (×4): 10 mg via ORAL
  Filled 2013-04-23 (×5): qty 1

## 2013-04-23 MED ORDER — COLCHICINE 0.6 MG PO TABS
0.6000 mg | ORAL_TABLET | Freq: Every day | ORAL | Status: DC
  Administered 2013-04-23 – 2013-04-26 (×4): 0.6 mg via ORAL
  Filled 2013-04-23 (×4): qty 1

## 2013-04-23 MED ORDER — IPRATROPIUM BROMIDE 0.02 % IN SOLN
500.0000 ug | Freq: Four times a day (QID) | RESPIRATORY_TRACT | Status: DC
  Administered 2013-04-23: 500 ug via RESPIRATORY_TRACT
  Filled 2013-04-23 (×2): qty 2.5

## 2013-04-23 MED ORDER — CHLORHEXIDINE GLUCONATE CLOTH 2 % EX PADS
6.0000 | MEDICATED_PAD | Freq: Every day | CUTANEOUS | Status: DC
  Administered 2013-04-23 – 2013-04-26 (×4): 6 via TOPICAL

## 2013-04-23 MED ORDER — ALPRAZOLAM 0.25 MG PO TABS: 0.5000 mg | ORAL_TABLET | Freq: Two times a day (BID) | ORAL | Status: DC | PRN

## 2013-04-23 MED ORDER — GLUCERNA SHAKE PO LIQD
237.0000 mL | Freq: Three times a day (TID) | ORAL | Status: DC
  Administered 2013-04-24 – 2013-04-25 (×2): 237 mL via ORAL

## 2013-04-23 MED ORDER — PANTOPRAZOLE SODIUM 40 MG PO TBEC
40.0000 mg | DELAYED_RELEASE_TABLET | Freq: Every day | ORAL | Status: DC
  Administered 2013-04-23 – 2013-04-25 (×3): 40 mg via ORAL
  Filled 2013-04-23 (×3): qty 1

## 2013-04-23 MED ORDER — BUSPIRONE HCL 15 MG PO TABS
7.5000 mg | ORAL_TABLET | Freq: Two times a day (BID) | ORAL | Status: DC
  Administered 2013-04-23 – 2013-04-26 (×8): 7.5 mg via ORAL
  Filled 2013-04-23 (×10): qty 1

## 2013-04-23 MED ORDER — INSULIN ASPART 100 UNIT/ML ~~LOC~~ SOLN
0.0000 [IU] | Freq: Three times a day (TID) | SUBCUTANEOUS | Status: DC
  Administered 2013-04-23: 4 [IU] via SUBCUTANEOUS
  Administered 2013-04-23: 7 [IU] via SUBCUTANEOUS
  Administered 2013-04-23: 4 [IU] via SUBCUTANEOUS
  Administered 2013-04-24 (×3): 7 [IU] via SUBCUTANEOUS
  Administered 2013-04-25 – 2013-04-26 (×6): 4 [IU] via SUBCUTANEOUS

## 2013-04-23 MED ORDER — POTASSIUM CHLORIDE CRYS ER 20 MEQ PO TBCR
40.0000 meq | EXTENDED_RELEASE_TABLET | Freq: Once | ORAL | Status: AC
  Administered 2013-04-23: 40 meq via ORAL
  Filled 2013-04-23: qty 2

## 2013-04-23 MED ORDER — MECLIZINE HCL 25 MG PO TABS
25.0000 mg | ORAL_TABLET | Freq: Every day | ORAL | Status: DC
  Administered 2013-04-23 – 2013-04-25 (×4): 25 mg via ORAL
  Filled 2013-04-23 (×5): qty 1

## 2013-04-23 MED ORDER — WARFARIN SODIUM 7.5 MG PO TABS: 7.5000 mg | ORAL_TABLET | ORAL | Status: DC

## 2013-04-23 MED ORDER — INSULIN GLARGINE 100 UNIT/ML ~~LOC~~ SOLN
30.0000 [IU] | Freq: Every day | SUBCUTANEOUS | Status: DC
  Filled 2013-04-23 (×2): qty 0.3

## 2013-04-23 MED ORDER — PRIMIDONE 50 MG PO TABS
25.0000 mg | ORAL_TABLET | Freq: Every day | ORAL | Status: DC
  Administered 2013-04-23 – 2013-04-26 (×4): 25 mg via ORAL
  Filled 2013-04-23 (×4): qty 0.5

## 2013-04-23 MED ORDER — IPRATROPIUM BROMIDE 0.02 % IN SOLN
500.0000 ug | Freq: Four times a day (QID) | RESPIRATORY_TRACT | Status: DC
  Administered 2013-04-23 – 2013-04-26 (×13): 500 ug via RESPIRATORY_TRACT
  Filled 2013-04-23 (×13): qty 2.5

## 2013-04-23 MED ORDER — ONDANSETRON HCL 4 MG PO TABS: 4.0000 mg | ORAL_TABLET | Freq: Four times a day (QID) | ORAL | Status: DC | PRN

## 2013-04-23 MED ORDER — DEXTROSE 5 % IV SOLN
1.0000 g | INTRAVENOUS | Status: DC
  Administered 2013-04-23 – 2013-04-25 (×3): 1 g via INTRAVENOUS
  Filled 2013-04-23 (×4): qty 10

## 2013-04-23 MED ORDER — TOPIRAMATE 25 MG PO TABS
50.0000 mg | ORAL_TABLET | Freq: Every day | ORAL | Status: DC
  Administered 2013-04-23 – 2013-04-26 (×4): 50 mg via ORAL
  Filled 2013-04-23 (×4): qty 2

## 2013-04-23 MED ORDER — HYDROCODONE-ACETAMINOPHEN 5-325 MG PO TABS
1.0000 | ORAL_TABLET | ORAL | Status: DC | PRN
Start: 2013-04-23 — End: 2013-04-26
  Administered 2013-04-24: 1 via ORAL
  Filled 2013-04-23: qty 1

## 2013-04-23 MED ORDER — MUPIROCIN 2 % EX OINT
1.0000 "application " | TOPICAL_OINTMENT | Freq: Two times a day (BID) | CUTANEOUS | Status: DC
  Administered 2013-04-23 – 2013-04-26 (×7): 1 via NASAL
  Filled 2013-04-23: qty 22

## 2013-04-23 MED ORDER — FEBUXOSTAT 40 MG PO TABS
80.0000 mg | ORAL_TABLET | Freq: Every day | ORAL | Status: DC
  Administered 2013-04-23 – 2013-04-25 (×4): 80 mg via ORAL
  Filled 2013-04-23 (×5): qty 2

## 2013-04-23 MED ORDER — ATORVASTATIN CALCIUM 10 MG PO TABS
10.0000 mg | ORAL_TABLET | Freq: Every day | ORAL | Status: DC
  Administered 2013-04-23 – 2013-04-25 (×4): 10 mg via ORAL
  Filled 2013-04-23 (×6): qty 1

## 2013-04-23 MED ORDER — BUDESONIDE 0.25 MG/2ML IN SUSP
0.2500 mg | Freq: Every day | RESPIRATORY_TRACT | Status: DC
  Administered 2013-04-23 – 2013-04-26 (×4): 0.25 mg via RESPIRATORY_TRACT
  Filled 2013-04-23 (×5): qty 2

## 2013-04-23 MED ORDER — GLUCERNA PO LIQD: 237.0000 mL | Freq: Three times a day (TID) | ORAL | Status: DC

## 2013-04-23 MED ORDER — SODIUM CHLORIDE 0.9 % IV SOLN: 250.0000 mL | INTRAVENOUS | Status: DC | PRN

## 2013-04-23 MED ORDER — ALBUTEROL SULFATE (5 MG/ML) 0.5% IN NEBU
2.5000 mg | INHALATION_SOLUTION | RESPIRATORY_TRACT | Status: DC | PRN
  Administered 2013-04-23: 2.5 mg via RESPIRATORY_TRACT
  Filled 2013-04-23: qty 0.5

## 2013-04-23 MED ORDER — BIOTENE DRY MOUTH MT LIQD
15.0000 mL | Freq: Two times a day (BID) | OROMUCOSAL | Status: DC
  Administered 2013-04-23 – 2013-04-26 (×7): 15 mL via OROMUCOSAL

## 2013-04-23 NOTE — Progress Notes (Signed)
Utilization Review Completed.  

## 2013-04-23 NOTE — Progress Notes (Signed)
PULMONARY  / CRITICAL CARE MEDICINE  Name: [name] MRN: [id number] DOB: 1961/01/29    ADMISSION DATE:  04/22/2013 CONSULTATION DATE:  04/22/2013  REFERRING MD :  Bonney Leitz PRIMARY SERVICE: [facility]  CHIEF COMPLAINT:  fever  BRIEF PATIENT DESCRIPTION: 52 y/o female with chronic nocturnal vent (followed by [facility]) for obesity hypoventilation was admitted on 10/29 for pyelonephritis, PCCM consulted to assist with home vent settings.  SIGNIFICANT EVENTS / STUDIES:    LINES / TUBES:   CULTURES: 10/29 blood >> 10/29 urine >>  ANTIBIOTICS: Rocephin 10/29>>>  SUBJECTIVE:  Tolerated vent overnight although not on it long.  Denies SOB.  C/o mild back pain, general malaise.   VITAL SIGNS: Temp:  [98.8 F (37.1 C)-102.2 F (39 C)] 98.8 F (37.1 C) (10/30 0729) Pulse Rate:  [66-87] 87 (10/30 0800) Resp:  [12-27] 22 (10/30 0800) BP: (97-137)/(42-65) 137/64 mmHg (10/30 0800) SpO2:  [93 %-100 %] 93 % (10/30 0800) FiO2 (%):  [28 %-30 %] 28 % (10/30 0729) Weight:  [421 lb 1.3 oz (191 kg)] 421 lb 1.3 oz (191 kg) (10/30 0423) HEMODYNAMICS:   VENTILATOR SETTINGS: Vent Mode:  [-] SIMV FiO2 (%):  [28 %-30 %] 28 % Set Rate:  [12 bmp] 12 bmp Vt Set:  [510 mL] 510 mL PEEP:  [5 cmH20] 5 cmH20 Pressure Support:  [15 cmH20] 15 cmH20 INTAKE / OUTPUT: Intake/Output     10/29 0701 - 10/30 0700 10/30 0701 - 10/31 0700   P.O. 240    I.V. (mL/kg) 3 (0)    Total Intake(mL/kg) 243 (1.3)    Urine (mL/kg/hr) 650 450 (0.8)   Total Output 650 450   Net -407 -450          PHYSICAL EXAMINATION: Gen: morbidly obese, no acute distress HEENT: NCAT, EOMi, Trach site c/d/i, PMV in place PULM: diminished but ess clear  CV: RRR, distant heart sounds, no JVD AB: BS+, soft, nontender, no hsm, large panus Ext: warm, no edema, no clubbing, no cyanosis Derm: chronic venous stasis changes bilateral legs Neuro: A&Ox4, maew  LABS:  CBC Recent Labs     04/22/13  1651  04/23/13  0415  WBC  8.6  6.5  HGB   12.5  11.0*  HCT  36.5  32.8*  PLT  127*  112*   Coag's Recent Labs     04/22/13  2352  04/23/13  0415  INR  1.96*  1.91*   BMET Recent Labs     04/22/13  1651  04/23/13  0415  NA  137  136  K  3.5  3.0*  CL  98  100  CO2  26  23  BUN  11  14  CREATININE  1.06  1.12*  GLUCOSE  128*  211*   Electrolytes Recent Labs     04/22/13  1651  04/23/13  0415  CALCIUM  8.9  8.5  MG   --   1.8  PHOS   --   4.4   Sepsis Markers No results found for this basename: LACTICACIDVEN, PROCALCITON, O2SATVEN,  in the last 72 hours ABG No results found for this basename: PHART, PCO2ART, PO2ART,  in the last 72 hours Liver Enzymes Recent Labs     04/22/13  1651  04/23/13  0415  AST  44*  29  ALT  26  22  ALKPHOS  56  49  BILITOT  0.5  0.6  ALBUMIN  3.3*  2.9*   Cardiac Enzymes No results  found for this basename: TROPONINI, PROBNP,  in the last 72 hours Glucose Recent Labs     04/23/13  0131  04/23/13  0906  GLUCAP  153*  213*     US Renal  04/23/2013   CLINICAL DATA:  Pyelonephritis  EXAM: RENAL/URINARY TRACT ULTRASOUND COMPLETE  COMPARISON:  03/25/2006  FINDINGS: Right Kidney  Length: 12 cm. Normal echogenicity. There is lobulation which may be congenital or from mild scarring. No hydronephrosis or evidence stone.  Left Kidney  Length: 13 cm.Normal echogenicity. There is lobulation which may be congenital or from mild scarring. No hydronephrosis or evidence stone.  Bladder  Obscured/decompressed.  IMPRESSION: No hydronephrosis or evidence of abscess.   Electronically Signed   By: Tiburcio Pea [name]   On: 04/23/2013 05:09   Dg Chest Port 1 View  (if Code Sepsis Called)  04/22/2013   CLINICAL DATA:  Sepsis, CHF, hypertension, diabetes  EXAM: PORTABLE CHEST - 1 VIEW  COMPARISON:  08/08/2012  FINDINGS: Tracheostomy 5 cm above the carina. Cardiomegaly evident with vascular congestion and basilar atelectasis. No definite CHF or pneumonia. No effusion or pneumothorax.   IMPRESSION: Stable cardiomegaly and vascular congestion. No significant interval change.   Electronically Signed   By: Ruel Favors [name]   On: 04/22/2013 17:16    ASSESSMENT / PLAN:  PULMONARY A: Obesity hypoventilation syndrome Asthma Chronic nocturnal vent through trach  P:   -continue pulmicort bid -prn duoneb -cont home vent settings as tol qhs > SIMV Rate 12, TVol 510cc, PS 15, PEEP 5, FiO2 30% -if intolerant of vent (as she has been in past), then OK to use humidified O2 alone  INFECTIOUS A:  Pyelonephritis P:   -per [facility]  WHITEHEART,KATHRYN, NP 04/23/2013  9:54 AM Pager: [phone] or [phone]  *Care during the described time interval was provided by me and/or other providers on the critical care team. I have reviewed this patient's available data, including medical history, events of note, physical examination and test results as part of my evaluation.  Alyson Reedy, [name] [facility] Pulmonary/Critical Care Medicine. Pager: [phone]. After hours pager: [phone].

## 2013-04-23 NOTE — Progress Notes (Signed)
Nutrition Brief Note  Patient identified on the Malnutrition Screening Tool (MST) Report for recent weight lost without trying and eating poorly because of a decreased appetite.  Per readings below, patient's weight has trended up since June 2014.  Wt Readings from Last 15 Encounters:  04/23/13 421 lb 1.3 oz (191 kg)  12/01/12 406 lb (184.16 kg)  10/15/12 424 lb (192.325 kg)  08/11/12 399 lb 0.5 oz (181 kg)  06/10/12 404 lb 11.2 oz (183.571 kg)  04/30/12 421 lb (190.964 kg)  03/27/12 412 lb 12.8 oz (187.245 kg)  03/04/12 417 lb 9.6 oz (189.422 kg)  02/14/12 410 lb 12.8 oz (186.338 kg)  02/04/12 411 lb 3.2 oz (186.519 kg)  01/21/12 417 lb (189.15 kg)  01/16/12 417 lb (189.15 kg)  01/01/12 426 lb 9.4 oz (193.5 kg)  12/13/11 426 lb 12.8 oz (193.595 kg)  12/05/11 420 lb (190.511 kg)    Body mass index is 65.93 kg/(m^2). Patient meets criteria for Obesity Class III based on current BMI.   Current diet order is Carbohydrate Modified Medium Calorie, patient is consuming approximately 100% of meals at this time. Labs and medications reviewed.   No nutrition interventions warranted at this time. If nutrition issues arise, please consult RD.   Maureen Chatters, RD, LDN Pager #: [phone] After-Hours Pager #: [phone]

## 2013-04-23 NOTE — Progress Notes (Signed)
Unable to obtain CBG reading with the glucometer. Tried 3 different glucometers and NT and RN checked > 10 times. MD notification in process.

## 2013-04-23 NOTE — Progress Notes (Signed)
ANTICOAGULATION CONSULT NOTE - Initial Consult  Pharmacy Consult for Warfarin  Indication: atrial fibrillation  Allergies  Allergen Reactions  . Molds & Smuts Shortness Of Breath and Rash  . Nitroglycerin Er Nausea And Vomiting  . Accolate [Zafirlukast] Other (See Comments)    Reaction unknown  . Allopurinol Other (See Comments)    GI problems   . Ciprofloxacin Other (See Comments)    GI problems  . Codeine Other (See Comments)    Heart problems  . Doxycycline Other (See Comments)    Gastric problems  . Fexofenadine Other (See Comments)    Hurts joints/pain  . Morphine Other (See Comments)    unknown  . Sulfa Drugs Cross Reactors Nausea And Vomiting    nausea  . Sulfonamide Derivatives Nausea Only    Patient Measurements: Height: 5\' 7"  (170.2 cm) Weight: 421 lb 1.3 oz (191 kg) IBW/kg (Calculated) : 61.6  Vital Signs: Temp: 99.1 F (37.3 C) (10/30 0026) Temp src: Oral (10/30 0026) BP: 114/54 mmHg (10/30 0026) Pulse Rate: 67 (10/30 0026)  Labs:  Recent Labs  04/22/13 1651 04/22/13 2352  HGB 12.5  --   HCT 36.5  --   PLT 127*  --   LABPROT  --  21.7*  INR  --  1.96*  CREATININE 1.06  --     Estimated Creatinine Clearance: 113.7 ml/min (by C-G formula based on Cr of 1.06).   Medical History: Past Medical History  Diagnosis Date  . OSA (obstructive sleep apnea)   . Morbid obesity   . Depressive disorder, not elsewhere classified   . Diabetes mellitus   . Asthma   . Dyslipidemia   . Clostridium difficile enterocolitis 2012  . Splenomegaly 06/01/2011  . Thrombocytopenia 06/01/2011  . Hepatosplenomegaly   . Gout   . GERD (gastroesophageal reflux disease)   . Osteoarthritis   . Polyneuropathy   . Urinary incontinence   . Amenorrhea   . CHF (congestive heart failure)   . Hyperlipidemia   . Leg swelling   . Nausea   . Weakness   . Palpitations   . PONV (postoperative nausea and vomiting)   . Anginal pain     no chest pain in years  .  Dysrhythmia     heart palpations  . Anxiety   . Pneumonia   . Shortness of breath   . Chronic kidney disease     overactive bladder  . Peripheral neuropathy     Medications:  Warfarin PTA Dose:  5mg  Mon/Tues/Thurs/Fri 7.5mg  Wed/Sat/Sun  Assessment: 52 y/o F on chronic warfarin for hx afib here with fever/SIRS. INR is 1.96, but pt has not had warfarin in >24 hours, so would likely be therapeutic on home regimen. Hgb 12.5, Scr 1.06, no overt bleeding noted.   Goal of Therapy:  INR 2-3 Monitor platelets by anticoagulation protocol: Yes   Plan:  -Warfarin per home regimen as above (will give dose NOW, as pt didn't have warfarin on 10/29) -Daily PT/INR -Adjust warfarin as needed -Monitor for bleeding   Thank you for allowing me to take part in this patient's care,  Abran Duke, PharmD Clinical Pharmacist Phone: [phone] Pager: [phone] 04/23/2013 12:45 AM

## 2013-04-23 NOTE — Progress Notes (Signed)
[facility] Progress Note [place] TEAM 1 - Stepdown/ICU TEAM   [name] WGN:[id number] DOB: 09/19/1960 DOA: 04/22/2013 PCP: Astrid Divine, MD  Brief narrative: 52 y/o morbidly obese female with trach for OSA, morbid obestityDM, urinary incontinence du eto overactive bladder, hepatosplenomegaly, gout and h/o C diff presents with fever, lower back pain, foul smelling urine. PCP gave her Bactrim which she took 2 doses of 12 hrs apart but did not find relief of symptoms.  PCP also sent urine cultures.    Assessment/Plan: Principal Problem:   Acute pyelonephritis - cont rocephin - f/u on cultures- if nothing grows , will need to see what outpt cultures grew.   Active Problems:   Morbid obesity - ambulates with walker- states she has lost some weight lately    OBESITY HYPOVENTILATION SYNDROME   OSA (obstructive sleep apnea)   Tracheostomy dependence  Chronic respiratory failure - managed by [facility] - cont trach and vent during sleep    Diabetes mellitus type 2 in obese - cont home meds- will need to increase Lantus as AM sugars high - med rec states she uses R 500 at home which the hospital does not carry    Current use of long term anticoagulation - not sure of the reason for Coumadin  Hypokalemia - replace   Code Status: full  Family Communication: none Disposition Plan: SDU   Consultants: pulm  Procedures: none  Antibiotics: Rocephin 10/29  DVT prophylaxis: Coumadin  HPI/Subjective: Feels back pain is present but much better. No appetite but able to drink liquids. Had e. Coli infection of her "kidneys" in the past.    Objective: Blood pressure 125/50, pulse 72, temperature 98.7 F (37.1 C), temperature source Oral, resp. rate 18, height 5\' 7"  (1.702 m), weight 191 kg (421 lb 1.3 oz), SpO2 92.00%.  Intake/Output Summary (Last 24 hours) at 04/23/13 1318 Last data filed at 04/23/13 1215  Gross per 24 hour  Intake    243 ml   Output   1700 ml  Net  -1457 ml     Exam: General: No acute respiratory distress Lungs: Clear to auscultation bilaterally without wheezes or crackles Cardiovascular: Regular rate and rhythm without murmur gallop or rub normal S1 and S2 Abdomen: Nontender, nondistended, soft, bowel sounds positive, no rebound, no ascites, no appreciable mass Extremities: No significant cyanosis, clubbing, or edema bilateral lower extremities  Data Reviewed: Basic Metabolic Panel:  Recent Labs Lab 04/22/13 1651 04/23/13 0415  NA 137 136  K 3.5 3.0*  CL 98 100  CO2 26 23  GLUCOSE 128* 211*  BUN 11 14  CREATININE 1.06 1.12*  CALCIUM 8.9 8.5  MG  --  1.8  PHOS  --  4.4   Liver Function Tests:  Recent Labs Lab 04/22/13 1651 04/23/13 0415  AST 44* 29  ALT 26 22  ALKPHOS 56 49  BILITOT 0.5 0.6  PROT 8.0 7.2  ALBUMIN 3.3* 2.9*   No results found for this basename: LIPASE, AMYLASE,  in the last 168 hours No results found for this basename: AMMONIA,  in the last 168 hours CBC:  Recent Labs Lab 04/22/13 1651 04/23/13 0415  WBC 8.6 6.5  NEUTROABS 5.9  --   HGB 12.5 11.0*  HCT 36.5 32.8*  MCV 94.3 93.2  PLT 127* 112*   Cardiac Enzymes: No results found for this basename: CKTOTAL, CKMB, CKMBINDEX, TROPONINI,  in the last 168 hours BNP (last 3 results) No results found for this basename: PROBNP,  in the last 8760 hours CBG:  Recent Labs Lab 04/23/13 0131 04/23/13 0906 04/23/13 1208  GLUCAP 153* 213* 188*    Recent Results (from the past 240 hour(s))  MRSA PCR SCREENING     Status: Abnormal   Collection Time    04/23/13 12:27 AM      Result Value Range Status   MRSA by PCR POSITIVE (*) NEGATIVE Final   Comment:            The GeneXpert MRSA Assay (FDA     approved for NASAL specimens     only), is one component of a     comprehensive MRSA colonization     surveillance program. It is not     intended to diagnose MRSA     infection nor to guide or     monitor  treatment for     MRSA infections.     RESULT CALLED TO, READ BACK BY AND VERIFIED WITH:     CALLED TO RN Lesli Albee [id number] @0447  THANEY     Studies:  Recent x-ray studies have been reviewed in detail by the Attending Physician  Scheduled Meds:  Scheduled Meds: . antiseptic oral rinse  15 mL Mouth Rinse BID  . aspirin  81 mg Oral Daily  . atorvastatin  10 mg Oral QHS  . budesonide  0.25 mg Nebulization Daily  . busPIRone  7.5 mg Oral BID  . Canagliflozin  100 mg Oral Daily  . cefTRIAXone (ROCEPHIN) IVPB 1 gram/50 mL D5W  1 g Intravenous Q24H  . Chlorhexidine Gluconate Cloth  6 each Topical Q0600  . colchicine  0.6 mg Oral Daily  . docusate sodium  100 mg Oral BID  . DULoxetine  30 mg Oral BID  . febuxostat  80 mg Oral QHS  . fesoterodine  4 mg Oral Daily  . gabapentin  100 mg Oral QID  . GLUCERNA  237 mL Oral TID BM  . guaiFENesin  600 mg Oral BID  . insulin aspart  0-20 Units Subcutaneous TID WC  . insulin aspart  0-5 Units Subcutaneous QHS  . insulin glargine  30 Units Subcutaneous QHS  . ipratropium  500 mcg Nebulization QID  . meclizine  25 mg Oral QHS  . mupirocin ointment  1 application Nasal BID  . nebivolol  10 mg Oral Daily  . pantoprazole  40 mg Oral Daily  . potassium chloride  40 mEq Oral Once  . primidone  25 mg Oral Daily  . primidone  75 mg Oral QHS  . sodium chloride  3 mL Intravenous Q12H  . sodium chloride  3 mL Intravenous Q12H  . topiramate  50 mg Oral Daily  . warfarin  7.5 mg Oral ONCE-1800  . Warfarin - Pharmacist Dosing Inpatient   Does not apply q1800   Continuous Infusions:   Time spent on care of this patient: 35 min   Calvert Cantor, MD  [facility] Office  [phone] Pager - Text Page per Loretha Stapler as per below:  On-Call/Text Page:      [email or web address]      password [email or web address]  If 7PM-7AM, please contact night-coverage [email or web address] Password [email or web address] 04/23/2013, 1:18 PM   LOS: 1 day

## 2013-04-23 NOTE — Progress Notes (Signed)
ANTICOAGULATION CONSULT NOTE - Follow Up Consult  Pharmacy Consult for coumadin Indication: atrial fibrillation  Allergies  Allergen Reactions  . Molds & Smuts Shortness Of Breath and Rash  . Nitroglycerin Er Nausea And Vomiting  . Accolate [Zafirlukast] Other (See Comments)    Reaction unknown  . Allopurinol Other (See Comments)    GI problems   . Ciprofloxacin Other (See Comments)    GI problems  . Codeine Other (See Comments)    Heart problems  . Doxycycline Other (See Comments)    Gastric problems  . Fexofenadine Other (See Comments)    Hurts joints/pain  . Morphine Other (See Comments)    unknown  . Sulfa Drugs Cross Reactors Nausea And Vomiting    nausea  . Sulfonamide Derivatives Nausea Only    Patient Measurements: Height: 5\' 7"  (170.2 cm) Weight: 421 lb 1.3 oz (191 kg) IBW/kg (Calculated) : 61.6   Vital Signs: Temp: 98.8 F (37.1 C) (10/30 0729) Temp src: Oral (10/30 0729) BP: 137/64 mmHg (10/30 0800) Pulse Rate: 87 (10/30 0800)  Labs:  Recent Labs  04/22/13 1651 04/22/13 2352 04/23/13 0415  HGB 12.5  --  11.0*  HCT 36.5  --  32.8*  PLT 127*  --  112*  LABPROT  --  21.7* 21.3*  INR  --  1.96* 1.91*  CREATININE 1.06  --  1.12*    Estimated Creatinine Clearance: 107.6 ml/min (by C-G formula based on Cr of 1.12).   Medications:  Scheduled:  . antiseptic oral rinse  15 mL Mouth Rinse BID  . aspirin  81 mg Oral Daily  . atorvastatin  10 mg Oral QHS  . budesonide  0.25 mg Nebulization Daily  . busPIRone  7.5 mg Oral BID  . Canagliflozin  100 mg Oral Daily  . cefTRIAXone (ROCEPHIN) IVPB 1 gram/50 mL D5W  1 g Intravenous Q24H  . Chlorhexidine Gluconate Cloth  6 each Topical Q0600  . colchicine  0.6 mg Oral Daily  . docusate sodium  100 mg Oral BID  . DULoxetine  30 mg Oral BID  . febuxostat  80 mg Oral QHS  . fesoterodine  4 mg Oral Daily  . gabapentin  100 mg Oral QID  . guaiFENesin  600 mg Oral BID  . insulin aspart  0-20 Units  Subcutaneous TID WC  . insulin aspart  0-5 Units Subcutaneous QHS  . insulin glargine  30 Units Subcutaneous QHS  . ipratropium  500 mcg Nebulization QID  . meclizine  25 mg Oral QHS  . mupirocin ointment  1 application Nasal BID  . nebivolol  10 mg Oral Daily  . pantoprazole  40 mg Oral Daily  . primidone  25 mg Oral Daily  . primidone  75 mg Oral QHS  . sodium chloride  3 mL Intravenous Q12H  . sodium chloride  3 mL Intravenous Q12H  . topiramate  50 mg Oral Daily  . warfarin  5 mg Oral Custom  . [START ON 04/25/2013] warfarin  7.5 mg Oral Custom  . Warfarin - Pharmacist Dosing Inpatient   Does not apply q1800    Assessment: 52 yo female here with SIRS and on coumadin for history of afib. INR today is 1.91.   Warfarin PTA Dose: 5mg  Mon/Tues/Thurs/Fri, 7.5mg  Wed/Sat/Sun  Goal of Therapy:  INR 2-3 Monitor platelets by anticoagulation protocol: Yes   Plan:  -Coumadin 7.5mg  po today -Daily PT/INR  Harland German, Pharm D 04/23/2013 9:43 AM

## 2013-04-24 LAB — GLUCOSE, CAPILLARY
Glucose-Capillary: 202 mg/dL — ABNORMAL HIGH (ref 70–99)
Glucose-Capillary: 234 mg/dL — ABNORMAL HIGH (ref 70–99)
Glucose-Capillary: 165 mg/dL — ABNORMAL HIGH (ref 70–99)

## 2013-04-24 LAB — PROTIME-INR: INR: 2.14 — ABNORMAL HIGH (ref 0.00–1.49)

## 2013-04-24 MED ORDER — WARFARIN SODIUM 5 MG PO TABS
5.0000 mg | ORAL_TABLET | Freq: Once | ORAL | Status: AC
  Administered 2013-04-24: 5 mg via ORAL
  Filled 2013-04-24: qty 1

## 2013-04-24 MED ORDER — INSULIN GLARGINE 100 UNIT/ML ~~LOC~~ SOLN
28.0000 [IU] | Freq: Two times a day (BID) | SUBCUTANEOUS | Status: DC
  Administered 2013-04-24 – 2013-04-25 (×2): 28 [IU] via SUBCUTANEOUS
  Filled 2013-04-24 (×3): qty 0.28

## 2013-04-24 NOTE — Progress Notes (Signed)
Pt does not want to be put on vent at this time. Pt wants to wear ATC tonight instead. Pt is now on 28% ATC and oxygen saturation is 98%.

## 2013-04-24 NOTE — Progress Notes (Signed)
[facility] Progress Note [place] TEAM 1 - Stepdown/ICU TEAM   [name] ZOX:[id number] DOB: [birth year] DOA: 04/22/2013 PCP: Astrid Divine, MD  Brief narrative: 52 y/o morbidly obese female with trach for OSA, DM, chronic urinary incontinence due to overactive bladder, hepatosplenomegaly, gout and h/o C diff presented with fever, lower back pain, foul smelling urine. PCP gave her Bactrim which she took 2 doses of 12 hrs apart but did not find relief of symptoms.   Assessment/Plan:  Acute pyelonephritis - remains on empiric abx - no growth on blood or urine cultures thus far - clinically improving  Morbid obesity - Body mass index is 65.93 kg/(m^2). 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  She has no new complaints today.  She feels that her back pain is much improved.  She denies fever nausea vomiting or abdominal pain.  Objective: Blood pressure 154/89, pulse 68, temperature 98.4 F (36.9 C), temperature source Oral, resp. rate 14, height 5\' 7"  (1.702 m), weight 191 kg (421 lb 1.3 oz), SpO2 100.00%.  Intake/Output Summary (Last 24 hours) at 04/24/13 1325 Last data filed at 04/24/13  0900  Gross per 24 hour  Intake    426 ml  Output   1850 ml  Net  -1424 ml   Exam: General: No acute respiratory distress Lungs: Clear to auscultation bilaterally without wheezes or crackles - breath sounds very distant Cardiovascular: Regular rate and rhythm - heart sounds very distant Abdomen: Morbidly obese, nontender, nondistended, soft, bowel sounds positive, no rebound, no ascites, no appreciable mass Extremities: No significant cyanosis, clubbing bilateral lower extremities - 1+ chronic appearing bilateral lower extremity edema w/ change of venous stasis dermatitis  Data Reviewed: Basic Metabolic Panel:  Recent Labs Lab 04/22/13 1651 04/23/13 0415  NA 137 136  K 3.5 3.0*  CL 98 100  CO2 26 23  GLUCOSE 128* 211*  BUN 11 14  CREATININE 1.06 1.12*  CALCIUM 8.9 8.5  MG  --  1.8  PHOS  --  4.4   Liver Function Tests:  Recent Labs Lab 04/22/13 1651 04/23/13 0415  AST 44* 29  ALT 26 22  ALKPHOS 56 49  BILITOT 0.5 0.6  PROT 8.0 7.2  ALBUMIN 3.3* 2.9*   CBC:  Recent Labs Lab 04/22/13 1651 04/23/13 0415  WBC 8.6 6.5  NEUTROABS 5.9  --   HGB 12.5 11.0*  HCT 36.5 32.8*  MCV 94.3 93.2  PLT 127* 112*   CBG:  Recent Labs Lab 04/23/13 1208 04/23/13 1709 04/23/13 2135 04/24/13 0741 04/24/13 1239  GLUCAP 188* 193* 202* 202* 234*    Recent Results (from the past 240 hour(s))  CULTURE, BLOOD (ROUTINE X 2)     Status:  None   Collection Time    04/22/13  5:40 PM      Result Value Range Status   Specimen Description BLOOD RIGHT FOREARM   Final   Special Requests BOTTLES DRAWN AEROBIC AND ANAEROBIC [facility]   Final   Culture  Setup Time     Final   Value: 04/23/2013 00:47     Performed at [facility]   Culture     Final   Value:        BLOOD CULTURE RECEIVED NO GROWTH TO DATE CULTURE WILL BE HELD FOR 5 DAYS BEFORE ISSUING A FINAL NEGATIVE REPORT     Performed at [facility]   Report Status PENDING   Incomplete  CULTURE, BLOOD (ROUTINE X  2)     Status: None   Collection Time    04/22/13  6:25 PM      Result Value Range Status   Specimen Description BLOOD ARM LEFT   Final   Special Requests BOTTLES DRAWN AEROBIC AND ANAEROBIC 5CC   Final   Culture  Setup Time     Final   Value: 04/23/2013 02:38     Performed at [facility]   Culture     Final   Value:        BLOOD CULTURE RECEIVED NO GROWTH TO DATE CULTURE WILL BE HELD FOR 5 DAYS BEFORE ISSUING A FINAL NEGATIVE REPORT     Performed at [facility]   Report Status PENDING   Incomplete  URINE CULTURE     Status: None   Collection Time    04/22/13  6:40 PM      Result Value Range Status   Specimen Description URINE, CLEAN CATCH   Final   Special Requests NONE   Final   Culture  Setup Time     Final   Value: 04/23/2013 03:04     Performed at [facility] Count     Final   Value: NO GROWTH     Performed at [facility]   Culture     Final   Value: NO GROWTH     Performed at [facility]   Report Status 04/23/2013 FINAL   Final  MRSA PCR SCREENING     Status: Abnormal   Collection Time    04/23/13 12:27 AM      Result Value Range Status   MRSA by PCR POSITIVE (*) NEGATIVE Final   Comment:            The GeneXpert MRSA Assay (FDA     approved for NASAL specimens     only), is one component of a     comprehensive MRSA colonization     surveillance program. It is not     intended to diagnose MRSA     infection nor to guide or     monitor treatment for     MRSA infections.     RESULT CALLED TO, READ BACK BY AND VERIFIED WITH:     CALLED TO RN Lesli Albee [id number] @0447  THANEY     Studies:  Recent x-ray studies have been reviewed in detail by the Attending Physician  Scheduled Meds:  Scheduled Meds: . antiseptic oral rinse  15 mL Mouth Rinse BID  . aspirin  81 mg Oral Daily  . atorvastatin  10 mg Oral QHS  . budesonide  0.25 mg Nebulization Daily  . busPIRone  7.5 mg Oral BID  .  Canagliflozin  100 mg Oral  Daily  . cefTRIAXone (ROCEPHIN) IVPB 1 gram/50 mL D5W  1 g Intravenous Q24H  . Chlorhexidine Gluconate Cloth  6 each Topical Q0600  . colchicine  0.6 mg Oral Daily  . docusate sodium  100 mg Oral BID  . DULoxetine  30 mg Oral BID  . febuxostat  80 mg Oral QHS  . feeding supplement (GLUCERNA SHAKE)  237 mL Oral TID BM  . fesoterodine  4 mg Oral Daily  . gabapentin  100 mg Oral QID  . guaiFENesin  600 mg Oral BID  . insulin aspart  0-20 Units Subcutaneous TID WC  . insulin aspart  0-5 Units Subcutaneous QHS  . insulin glargine  35 Units Subcutaneous QHS  . ipratropium  500 mcg Nebulization QID  . meclizine  25 mg Oral QHS  . mupirocin ointment  1 application Nasal BID  . nebivolol  10 mg Oral Daily  . pantoprazole  40 mg Oral Daily  . primidone  25 mg Oral Daily  . primidone  75 mg Oral QHS  . sodium chloride  3 mL Intravenous Q12H  . sodium chloride  3 mL Intravenous Q12H  . topiramate  50 mg Oral Daily  . warfarin  5 mg Oral ONCE-1800  . Warfarin - Pharmacist Dosing Inpatient   Does not apply q1800    Time spent on care of this patient: 35 min   Nghia Mcentee T, MD  [facility] Office  [phone] Pager - Text Page per Loretha Stapler as per below:  On-Call/Text Page:      [email or web address]      password [email or web address]  If 7PM-7AM, please contact night-coverage [email or web address] Password [email or web address] 04/24/2013, 1:25 PM   LOS: 2 days

## 2013-04-24 NOTE — Progress Notes (Signed)
ANTICOAGULATION CONSULT NOTE - Follow Up Consult  Pharmacy Consult for coumadin Indication: atrial fibrillation  Vital Signs: Temp: 98.3 F (36.8 C) (10/31 0745) Temp src: Oral (10/31 0745) BP: 140/76 mmHg (10/31 0745) Pulse Rate: 59 (10/31 0745)  Labs:  Recent Labs  04/22/13 1651 04/22/13 2352 04/23/13 0415 04/24/13 0430  HGB 12.5  --  11.0*  --   HCT 36.5  --  32.8*  --   PLT 127*  --  112*  --   LABPROT  --  21.7* 21.3* 23.2*  INR  --  1.96* 1.91* 2.14*  CREATININE 1.06  --  1.12*  --     Estimated Creatinine Clearance: 107.6 ml/min (by C-G formula based on Cr of 1.12).  Assessment: 52 yo female here with SIRS and on coumadin for history of afib. INR today is 2.1. No bleeding issues noted.  Warfarin PTA Dose: 5mg  Mon/Tues/Thurs/Fri, 7.5mg  Wed/Sat/Sun  Goal of Therapy:  INR 2-3 Monitor platelets by anticoagulation protocol: Yes   Plan:  -Coumadin 5mg  po today -Daily PT/INR  Sheppard Coil PharmD., BCPS Clinical Pharmacist Pager [phone] 04/24/2013 8:37 AM

## 2013-04-24 NOTE — Progress Notes (Signed)
Physical Therapy Evaluation Patient Details Name: [name] MRN: [id number] DOB: February 22, 1961 Today's Date: 04/24/2013 Time: 1027-2536 PT Time Calculation (min): 19 min  PT Assessment / Plan / Recommendation History of Present Illness  Pt admit for pyelonephritis with trach.  Clinical Impression  Pt admitted with above. Pt currently with functional limitations due to the deficits listed below (see PT Problem List). Able to ambulate today short distance with steadying assist only.   Pt will benefit from skilled PT to increase their independence and safety with mobility to allow discharge to the venue listed below.      PT Assessment  Patient needs continued PT services    Follow Up Recommendations  Home health PT;Supervision/Assistance - 24 hour                Equipment Recommendations  Other (comment) (tub bench)         Frequency Min 3X/week    Precautions / Restrictions Precautions Precautions: Fall Restrictions Weight Bearing Restrictions: No   Pertinent Vitals/Pain VSS, no pain      Mobility  Bed Mobility Bed Mobility: Not assessed Details for Bed Mobility Assistance: Sitting on EOB on arrival. Transfers Transfers: Sit to Stand;Stand to Sit Sit to Stand: 4: Min assist;With upper extremity assist;From bed Stand to Sit: 4: Min assist;With upper extremity assist;To bed Details for Transfer Assistance: Pt needed cues for hand placement.  Pt needed steadying assist upon initial stand.  Pt widens BOS for stability.  Assisted pt to sit on bed after treatment and then pt stated she needed to get on 3N1.  Assisted pt to 3N1 with min steadying assist stand pivot without RW.   Ambulation/Gait Ambulation/Gait Assistance: 4: Min assist Ambulation Distance (Feet): 35 Feet Assistive device: Rolling walker Ambulation/Gait Assistance Details: Pt ambulated with RW with slow gait.  Noted fatigue last 10 feet where pt was losing postural stability and leaning forward and  struggling to take full steps as well as needed assist and cues for walker placement.  Needed cues to step back close enough to bed as well.   Gait Pattern: Step-to pattern;Decreased stride length;Wide base of support;Trunk flexed Gait velocity: decreased Stairs: No Wheelchair Mobility Wheelchair Mobility: No         PT Diagnosis: Generalized weakness  PT Problem List: Decreased activity tolerance;Decreased mobility;Decreased balance;Decreased knowledge of use of DME;Decreased safety awareness;Decreased knowledge of precautions PT Treatment Interventions: DME instruction;Gait training;Functional mobility training;Therapeutic activities;Therapeutic exercise;Balance training;Patient/family education     PT Goals(Current goals can be found in the care plan section) Acute Rehab PT Goals Patient Stated Goal: to go home PT Goal Formulation: With patient Time For Goal Achievement: 05/01/13 Potential to Achieve Goals: Good  Visit Information  Last PT Received On: 04/24/13 Assistance Needed: +2 (for chair follow) History of Present Illness: Pt admit for pyelonephritis with trach.       Prior Functioning  Home Living Family/patient expects to be discharged to:: Private residence Living Arrangements: Alone Available Help at Discharge: Personal care attendant;Available PRN/intermittently (5 hours M-F, 3 hours Sat and SUn) Type of Home: House Home Access: Level entry Home Layout: One level Home Equipment: Walker - 2 wheels;Walker - 4 wheels;Bedside commode;Shower seat;Wheelchair - manual Additional Comments: sleeps in recliner Prior Function Level of Independence: Needs assistance ADL's / Homemaking Assistance Needed: needs assist with cooking, cleaning and bathing Communication Communication: No difficulties;Tracheostomy;Passy-Muir valve Dominant Hand: Right    Cognition  Cognition Arousal/Alertness: Awake/alert Behavior During Therapy: WFL for tasks assessed/performed Overall  Cognitive Status:  Within Functional Limits for tasks assessed    Extremity/Trunk Assessment Upper Extremity Assessment Upper Extremity Assessment: Defer to OT evaluation Lower Extremity Assessment Lower Extremity Assessment: Generalized weakness   Balance Balance Balance Assessed: Yes Static Sitting Balance Static Sitting - Balance Support: No upper extremity supported;Feet unsupported Static Sitting - Level of Assistance: 5: Stand by assistance Static Sitting - Comment/# of Minutes: 5 Static Standing Balance Static Standing - Balance Support: Bilateral upper extremity supported;During functional activity Static Standing - Level of Assistance: 4: Min assist Static Standing - Comment/# of Minutes: 2  End of Session PT - End of Session Equipment Utilized During Treatment: Gait belt Activity Tolerance: Patient tolerated treatment well Patient left: Other (comment);with call bell/phone within reach (on 3N1) Nurse Communication: Mobility status       INGOLD,Larinda Herter 04/24/2013, 1:41 PM [facility] [phone] (pager)

## 2013-04-24 NOTE — Progress Notes (Signed)
PULMONARY  / CRITICAL CARE MEDICINE  Name: [name] MRN: [id number] DOB: 04/16/1961    ADMISSION DATE:  04/22/2013 CONSULTATION DATE:  04/22/2013  REFERRING MD :  Bonney Leitz PRIMARY SERVICE: [facility]  CHIEF COMPLAINT:  fever  BRIEF PATIENT DESCRIPTION: 52 y/o female with chronic nocturnal vent (followed by VS) for obesity hypoventilation was admitted on 10/29 for pyelonephritis, PCCM consulted to assist with home vent settings.  SIGNIFICANT EVENTS / STUDIES:    LINES / TUBES:   CULTURES: 10/29 blood >> 10/29 urine >>  ANTIBIOTICS: Rocephin 10/29>>>  SUBJECTIVE:  Tolerated vent overnight although not on it long.  Denies SOB.  C/o mild back pain, general malaise.   VITAL SIGNS: Temp:  [98.3 F (36.8 C)-99.7 F (37.6 C)] 98.3 F (36.8 C) (10/31 0745) Pulse Rate:  [59-88] 63 (10/31 0839) Resp:  [17-32] 18 (10/31 0839) BP: (124-140)/(50-76) 140/76 mmHg (10/31 0745) SpO2:  [92 %-100 %] 100 % (10/31 0839) FiO2 (%):  [28 %-35 %] 28 % (10/31 0839) HEMODYNAMICS:   VENTILATOR SETTINGS: Vent Mode:  [-] SIMV FiO2 (%):  [28 %-35 %] 28 % Set Rate:  [12 bmp] 12 bmp Vt Set:  [510 mL] 510 mL PEEP:  [5 cmH20] 5 cmH20 Pressure Support:  [15 cmH20] 15 cmH20 INTAKE / OUTPUT: Intake/Output     10/30 0701 - 10/31 0700 10/31 0701 - 11/01 0700   P.O. 710 300   I.V. (mL/kg)  6 (0)   Total Intake(mL/kg) 710 (3.7) 306 (1.6)   Urine (mL/kg/hr) 2900 (0.6)    Total Output 2900     Net -2190 +306        Stool Occurrence  1 x     PHYSICAL EXAMINATION: Gen: morbidly obese, no acute distress HEENT: NCAT, EOMi, Trach site c/d/i, PMV in place PULM: diminished but ess clear  CV: RRR, distant heart sounds, no JVD AB: BS+, soft, nontender, no hsm, large panus Ext: warm, no edema, no clubbing, no cyanosis Derm: chronic venous stasis changes bilateral legs Neuro: A&Ox4, maew  LABS:  CBC Recent Labs     04/22/13  1651  04/23/13  0415  WBC  8.6  6.5  HGB  12.5  11.0*  HCT  36.5  32.8*   PLT  127*  112*   Coag's Recent Labs     04/22/13  2352  04/23/13  0415  04/24/13  0430  INR  1.96*  1.91*  2.14*   BMET Recent Labs     04/22/13  1651  04/23/13  0415  NA  137  136  K  3.5  3.0*  CL  98  100  CO2  26  23  BUN  11  14  CREATININE  1.06  1.12*  GLUCOSE  128*  211*   Electrolytes Recent Labs     04/22/13  1651  04/23/13  0415  CALCIUM  8.9  8.5  MG   --   1.8  PHOS   --   4.4   Sepsis Markers No results found for this basename: LACTICACIDVEN, PROCALCITON, O2SATVEN,  in the last 72 hours ABG No results found for this basename: PHART, PCO2ART, PO2ART,  in the last 72 hours Liver Enzymes Recent Labs     04/22/13  1651  04/23/13  0415  AST  44*  29  ALT  26  22  ALKPHOS  56  49  BILITOT  0.5  0.6  ALBUMIN  3.3*  2.9*   Cardiac Enzymes No results found  for this basename: TROPONINI, PROBNP,  in the last 72 hours Glucose Recent Labs     04/23/13  0131  04/23/13  0906  04/23/13  1208  04/23/13  1709  04/23/13  2135  04/24/13  0741  GLUCAP  153*  213*  188*  193*  202*  202*     US Renal  04/23/2013   CLINICAL DATA:  Pyelonephritis  EXAM: RENAL/URINARY TRACT ULTRASOUND COMPLETE  COMPARISON:  03/25/2006  FINDINGS: Right Kidney  Length: 12 cm. Normal echogenicity. There is lobulation which may be congenital or from mild scarring. No hydronephrosis or evidence stone.  Left Kidney  Length: 13 cm.Normal echogenicity. There is lobulation which may be congenital or from mild scarring. No hydronephrosis or evidence stone.  Bladder  Obscured/decompressed.  IMPRESSION: No hydronephrosis or evidence of abscess.   Electronically Signed   By: Tiburcio Pea [name]   On: 04/23/2013 05:09   Dg Chest Port 1 View  (if Code Sepsis Called)  04/22/2013   CLINICAL DATA:  Sepsis, CHF, hypertension, diabetes  EXAM: PORTABLE CHEST - 1 VIEW  COMPARISON:  08/08/2012  FINDINGS: Tracheostomy 5 cm above the carina. Cardiomegaly evident with vascular congestion and  basilar atelectasis. No definite CHF or pneumonia. No effusion or pneumothorax.  IMPRESSION: Stable cardiomegaly and vascular congestion. No significant interval change.   Electronically Signed   By: Ruel Favors [name]   On: 04/22/2013 17:16    ASSESSMENT / PLAN: Obesity hypoventilation syndrome Asthma Chronic nocturnal vent through trach  P:   -continue pulmicort bid -prn duoneb -cont home vent settings as tol qhs  -if intolerant of vent (as she has been in past), then OK to use humidified O2 alone  Pyelonephritis, DM P:   -per [facility]  BABCOCK,PETE, NP 04/24/2013  10:39 AM  Patient seen and examined, agree with above note.  I dictated the care and orders written for this patient under my direction.  Alyson Reedy, MD [phone]

## 2013-04-25 LAB — GLUCOSE, CAPILLARY
Glucose-Capillary: 181 mg/dL — ABNORMAL HIGH (ref 70–99)
Glucose-Capillary: 220 mg/dL — ABNORMAL HIGH (ref 70–99)
Glucose-Capillary: 214 mg/dL — ABNORMAL HIGH (ref 70–99)

## 2013-04-25 LAB — BASIC METABOLIC PANEL
BUN: 17 mg/dL (ref 6–23)
CO2: 26 mEq/L (ref 19–32)
Calcium: 8.9 mg/dL (ref 8.4–10.5)
Creatinine, Ser: 1 mg/dL (ref 0.50–1.10)
GFR calc Af Amer: 75 mL/min — ABNORMAL LOW (ref >90)
Glucose, Bld: 193 mg/dL — ABNORMAL HIGH (ref 70–99)
Potassium: 3.5 mEq/L (ref 3.5–5.1)
Sodium: 136 mEq/L (ref 135–145)

## 2013-04-25 LAB — CBC
HCT: 33.5 % — ABNORMAL LOW (ref 36.0–46.0)
MCH: 31.3 pg (ref 26.0–34.0)
MCV: 92.8 fL (ref 78.0–100.0)
Platelets: 119 10*3/uL — ABNORMAL LOW (ref 150–400)
RDW: 14.7 % (ref 11.5–15.5)
WBC: 6.7 10*3/uL (ref 4.0–10.5)

## 2013-04-25 MED ORDER — SACCHAROMYCES BOULARDII 250 MG PO CAPS
250.0000 mg | ORAL_CAPSULE | Freq: Two times a day (BID) | ORAL | Status: DC
  Administered 2013-04-25 – 2013-04-26 (×3): 250 mg via ORAL
  Filled 2013-04-25 (×5): qty 1

## 2013-04-25 MED ORDER — WARFARIN SODIUM 7.5 MG PO TABS
7.5000 mg | ORAL_TABLET | Freq: Once | ORAL | Status: AC
  Administered 2013-04-25: 7.5 mg via ORAL
  Filled 2013-04-25: qty 1

## 2013-04-25 MED ORDER — INSULIN GLARGINE 100 UNIT/ML ~~LOC~~ SOLN
32.0000 [IU] | Freq: Two times a day (BID) | SUBCUTANEOUS | Status: DC
  Administered 2013-04-25 – 2013-04-26 (×2): 32 [IU] via SUBCUTANEOUS
  Filled 2013-04-25 (×3): qty 0.32

## 2013-04-25 MED ORDER — FAMOTIDINE 40 MG PO TABS
40.0000 mg | ORAL_TABLET | Freq: Two times a day (BID) | ORAL | Status: DC
  Administered 2013-04-25 – 2013-04-26 (×2): 40 mg via ORAL
  Filled 2013-04-25 (×3): qty 1

## 2013-04-25 NOTE — Progress Notes (Signed)
ANTICOAGULATION CONSULT NOTE - Follow Up Consult  Pharmacy Consult for warfarin Indication: atrial fibrillation  Allergies  Allergen Reactions  . Molds & Smuts Shortness Of Breath and Rash  . Nitroglycerin Er Nausea And Vomiting  . Accolate [Zafirlukast] Other (See Comments)    Reaction unknown  . Allopurinol Other (See Comments)    GI problems   . Ciprofloxacin Other (See Comments)    GI problems  . Codeine Other (See Comments)    Heart problems  . Doxycycline Other (See Comments)    Gastric problems  . Fexofenadine Other (See Comments)    Hurts joints/pain  . Morphine Other (See Comments)    unknown  . Sulfa Drugs Cross Reactors Nausea And Vomiting    nausea  . Sulfonamide Derivatives Nausea Only    Patient Measurements: Height: 5\' 7"  (170.2 cm) Weight: 421 lb 1.3 oz (191 kg) IBW/kg (Calculated) : 61.6   Vital Signs: Temp: 97.6 F (36.4 C) (11/01 0438) Temp src: Oral (11/01 0438) BP: 135/71 mmHg (11/01 0438) Pulse Rate: 57 (11/01 0438)  Labs:  Recent Labs  04/22/13 1651  04/23/13 0415 04/24/13 0430 04/25/13 0535  HGB 12.5  --  11.0*  --  11.3*  HCT 36.5  --  32.8*  --  33.5*  PLT 127*  --  112*  --  119*  LABPROT  --   < > 21.3* 23.2* 26.0*  INR  --   < > 1.91* 2.14* 2.48*  CREATININE 1.06  --  1.12*  --  1.00  < > = values in this interval not displayed.  Estimated Creatinine Clearance: 120.5 ml/min (by C-G formula based on Cr of 1).   Medications:  Scheduled:  . antiseptic oral rinse  15 mL Mouth Rinse BID  . aspirin  81 mg Oral Daily  . atorvastatin  10 mg Oral QHS  . budesonide  0.25 mg Nebulization Daily  . busPIRone  7.5 mg Oral BID  . Canagliflozin  100 mg Oral Daily  . cefTRIAXone (ROCEPHIN) IVPB 1 gram/50 mL D5W  1 g Intravenous Q24H  . Chlorhexidine Gluconate Cloth  6 each Topical Q0600  . colchicine  0.6 mg Oral Daily  . docusate sodium  100 mg Oral BID  . DULoxetine  30 mg Oral BID  . febuxostat  80 mg Oral QHS  . feeding  supplement (GLUCERNA SHAKE)  237 mL Oral TID BM  . fesoterodine  4 mg Oral Daily  . gabapentin  100 mg Oral QID  . guaiFENesin  600 mg Oral BID  . insulin aspart  0-20 Units Subcutaneous TID WC  . insulin aspart  0-5 Units Subcutaneous QHS  . insulin glargine  28 Units Subcutaneous BID  . ipratropium  500 mcg Nebulization QID  . meclizine  25 mg Oral QHS  . mupirocin ointment  1 application Nasal BID  . nebivolol  10 mg Oral Daily  . pantoprazole  40 mg Oral Daily  . primidone  25 mg Oral Daily  . primidone  75 mg Oral QHS  . topiramate  50 mg Oral Daily  . Warfarin - Pharmacist Dosing Inpatient   Does not apply q1800    Assessment: 62 YOF who continues on warfarin for AFib. Her INR remains at goal this morning at 2.48. Her home regimen is 5mg  daily except 7.5mg  on Wednesdays, Saturdays, and Sundays. Hgb and plts are low but stable, no bleeding noted.  Goal of Therapy:  INR 2-3 Monitor platelets by anticoagulation protocol: Yes  Plan:  1. Warfarin 7.5mg  tonight as per home regimen 2. Daily PT/INR 3. Follow CBC and any signs of bleeding  Ozan Maclay D. Hardeep Reetz, PharmD Clinical Pharmacist Pager: [phone] 04/25/2013 7:39 AM

## 2013-04-25 NOTE — Progress Notes (Signed)
[facility] Progress Note [name]:[id number] DOB: 01/24/1961 DOA: 04/22/2013 PCP: Astrid Divine, MD  Brief narrative: 52 y/o morbidly obese female with trach for OSA, DM, chronic urinary incontinence due to overactive bladder, hepatosplenomegaly, gout and h/o C diff presented with fever, lower back pain, foul smelling urine. PCP gave her Bactrim which she took 2 doses of 12 hrs apart but did not find relief of symptoms.   Assessment/Plan:  Acute pyelonephritis - cont empiric abx - neither blood cultures nor urine cultures successful in identifying the offending pathogen - clinically improving - with some persistent nausea I will monitor as inpatient one additional night   Morbid obesity - Body mass index is 65.93 kg/(m^2). 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  She is nauseated, but has not been  vomiting.  She reports that her flank/back pain is improved.    Objective: Blood pressure 132/55, pulse 65, temperature 98 F (36.7 C), temperature source Oral, resp. rate 14, height 5\' 7"  (1.702 m), weight 191 kg (421 lb 1.3 oz), SpO2 98.00%.  Intake/Output Summary (Last 24 hours) at 04/25/13 1122 Last data filed at 04/25/13 0444  Gross per 24 hour  Intake   1180 ml  Output   3050 ml  Net  -1870 ml   Exam: General: No acute respiratory distress at rest  Lungs: Clear to auscultation bilaterally without wheezes or crackles - breath sounds very distant th/o  Cardiovascular: Regular rate and rhythm - heart sounds very distant Abdomen: Morbidly obese, nontender, nondistended, soft, bowel sounds positive, no rebound, no ascites, no appreciable mass Extremities: No significant cyanosis, clubbing bilateral lower extremities - 1+ chronic appearing bilateral lower extremity edema w/ change of venous stasis dermatitis  Data Reviewed: Basic Metabolic Panel:  Recent Labs Lab 04/22/13 1651 04/23/13 0415 04/25/13 0535  NA 137 136 136  K 3.5 3.0* 3.5  CL 98 100 99  CO2 26 23 26   GLUCOSE 128* 211* 193*  BUN 11 14 17   CREATININE 1.06 1.12* 1.00  CALCIUM 8.9 8.5 8.9  MG  --  1.8 1.9  PHOS  --  4.4  --    Liver Function Tests:  Recent Labs Lab 04/22/13 1651 04/23/13 0415  AST 44* 29  ALT 26 22  ALKPHOS 56 49  BILITOT 0.5 0.6  PROT 8.0 7.2  ALBUMIN 3.3* 2.9*   CBC:  Recent  Labs Lab 04/22/13 1651 04/23/13 0415 04/25/13 0535  WBC 8.6 6.5 6.7  NEUTROABS 5.9  --   --   HGB 12.5 11.0* 11.3*  HCT 36.5 32.8* 33.5*  MCV 94.3 93.2 92.8  PLT 127* 112* 119*   CBG:  Recent Labs Lab 04/24/13 0741 04/24/13 1239 04/24/13 1709 04/24/13 2157 04/25/13 0751  GLUCAP 202* 234* 181* 165* 181*    Recent Results (from the past 240 hour(s))  CULTURE, BLOOD (ROUTINE X 2)     Status: None   Collection Time    04/22/13  5:40 PM      Result Value Range Status   Specimen Description  BLOOD RIGHT FOREARM   Final   Special Requests BOTTLES DRAWN AEROBIC AND ANAEROBIC 6CC   Final   Culture  Setup Time     Final   Value: 04/23/2013 00:47     Performed at [facility]   Culture     Final   Value:        BLOOD CULTURE RECEIVED NO GROWTH TO DATE CULTURE WILL BE HELD FOR 5 DAYS BEFORE ISSUING A FINAL NEGATIVE REPORT     Performed at [facility]   Report Status PENDING   Incomplete  CULTURE, BLOOD (ROUTINE X 2)     Status: None   Collection Time    04/22/13  6:25 PM      Result Value Range Status   Specimen Description BLOOD ARM LEFT   Final   Special Requests BOTTLES DRAWN AEROBIC AND ANAEROBIC 5CC   Final   Culture  Setup Time     Final   Value: 04/23/2013 02:38     Performed at [facility]   Culture     Final   Value:        BLOOD CULTURE RECEIVED NO GROWTH TO DATE CULTURE WILL BE HELD FOR 5 DAYS BEFORE ISSUING A FINAL NEGATIVE REPORT     Performed at [facility]   Report Status PENDING   Incomplete  URINE CULTURE     Status: None   Collection Time    04/22/13  6:40 PM      Result Value Range Status   Specimen Description URINE, CLEAN CATCH   Final   Special Requests NONE   Final   Culture  Setup Time     Final   Value: 04/23/2013 03:04     Performed at [facility] Count     Final   Value: NO GROWTH     Performed at [facility]   Culture     Final   Value: NO GROWTH     Performed at [facility]   Report Status 04/23/2013 FINAL   Final  MRSA PCR SCREENING     Status: Abnormal   Collection Time    04/23/13 12:27 AM      Result Value Range Status   MRSA by PCR POSITIVE (*) NEGATIVE Final   Comment:            The GeneXpert MRSA Assay (FDA     approved for NASAL specimens     only), is one component of a     comprehensive MRSA colonization     surveillance program. It is not     intended to diagnose MRSA     infection nor to guide or     monitor treatment for     MRSA infections.      RESULT  CALLED TO, READ BACK BY AND VERIFIED WITH:     CALLED TO RN Lesli Albee [phone] @[id number]  THANEY     Studies:  Recent x-ray studies have been reviewed in detail by the Attending Physician  Scheduled Meds:  Scheduled Meds: . antiseptic oral rinse  15 mL Mouth Rinse BID  . aspirin  81 mg Oral Daily  . atorvastatin  10 mg Oral QHS  . budesonide  0.25 mg Nebulization Daily  . busPIRone  7.5 mg Oral BID  . Canagliflozin  100 mg Oral Daily  . cefTRIAXone (ROCEPHIN) IVPB 1 gram/50 mL D5W  1 g Intravenous Q24H  . Chlorhexidine Gluconate Cloth  6 each Topical Q0600  . colchicine  0.6 mg Oral Daily  . docusate sodium  100 mg Oral BID  . DULoxetine  30 mg Oral BID  . febuxostat  80 mg Oral QHS  . feeding supplement (GLUCERNA SHAKE)  237 mL Oral TID BM  . fesoterodine  4 mg Oral Daily  . gabapentin  100 mg Oral QID  . guaiFENesin  600 mg Oral BID  . insulin aspart  0-20 Units Subcutaneous TID WC  . insulin aspart  0-5 Units Subcutaneous QHS  . insulin glargine  28 Units Subcutaneous BID  . ipratropium  500 mcg Nebulization QID  . meclizine  25 mg Oral QHS  . mupirocin ointment  1 application Nasal BID  . nebivolol  10 mg Oral Daily  . pantoprazole  40 mg Oral Daily  . primidone  25 mg Oral Daily  . primidone  75 mg Oral QHS  . topiramate  50 mg Oral Daily  . warfarin  7.5 mg Oral ONCE-1800  . Warfarin - Pharmacist Dosing Inpatient   Does not apply q1800    Time spent on care of this patient: 25 min   [facility] T, MD  [facility] Office  [phone] Pager - Text Page per Loretha Stapler as per below:  On-Call/Text Page:      [email or web address]      password [email or web address]  If 7PM-7AM, please contact night-coverage [email or web address] Password [email or web address] 04/25/2013, 11:22 AM   LOS: 3 days

## 2013-04-26 LAB — CBC
HCT: 35 % — ABNORMAL LOW (ref 36.0–46.0)
MCHC: 34 g/dL (ref 30.0–36.0)
MCV: 91.9 fL (ref 78.0–100.0)
Platelets: 133 10*3/uL — ABNORMAL LOW (ref 150–400)
RDW: 14.6 % (ref 11.5–15.5)
WBC: 6.9 10*3/uL (ref 4.0–10.5)

## 2013-04-26 LAB — PROTIME-INR: INR: 2.52 — ABNORMAL HIGH (ref 0.00–1.49)

## 2013-04-26 LAB — GLUCOSE, CAPILLARY: Glucose-Capillary: 163 mg/dL — ABNORMAL HIGH (ref 70–99)

## 2013-04-26 MED ORDER — SACCHAROMYCES BOULARDII 250 MG PO CAPS: 250.0000 mg | ORAL_CAPSULE | Freq: Two times a day (BID) | ORAL | Status: DC

## 2013-04-26 MED ORDER — DEXTROSE 5 % IV SOLN
1.0000 g | INTRAVENOUS | Status: DC
  Administered 2013-04-26: 1 g via INTRAVENOUS
  Filled 2013-04-26: qty 10

## 2013-04-26 MED ORDER — CEFUROXIME AXETIL 500 MG PO TABS: 500.0000 mg | ORAL_TABLET | Freq: Two times a day (BID) | ORAL | Status: DC

## 2013-04-26 MED ORDER — CEFUROXIME AXETIL 500 MG PO TABS
500.0000 mg | ORAL_TABLET | Freq: Two times a day (BID) | ORAL | Status: DC
  Filled 2013-04-26: qty 1

## 2013-04-26 MED ORDER — WARFARIN SODIUM 7.5 MG PO TABS
7.5000 mg | ORAL_TABLET | Freq: Once | ORAL | Status: AC
  Administered 2013-04-26: 7.5 mg via ORAL
  Filled 2013-04-26: qty 1

## 2013-04-26 MED ORDER — BUDESONIDE 0.25 MG/2ML IN SUSP: 0.2500 mg | Freq: Two times a day (BID) | RESPIRATORY_TRACT | Status: DC

## 2013-04-26 NOTE — Discharge Instructions (Signed)

## 2013-04-26 NOTE — Progress Notes (Signed)
ANTICOAGULATION CONSULT NOTE - Follow Up Consult  Pharmacy Consult for warfarin Indication: atrial fibrillation  Allergies  Allergen Reactions  . Molds & Smuts Shortness Of Breath and Rash  . Nitroglycerin Er Nausea And Vomiting  . Accolate [Zafirlukast] Other (See Comments)    Reaction unknown  . Allopurinol Other (See Comments)    GI problems   . Ciprofloxacin Other (See Comments)    GI problems  . Codeine Other (See Comments)    Heart problems  . Doxycycline Other (See Comments)    Gastric problems  . Fexofenadine Other (See Comments)    Hurts joints/pain  . Morphine Other (See Comments)    unknown  . Sulfa Drugs Cross Reactors Nausea And Vomiting    nausea  . Sulfonamide Derivatives Nausea Only    Patient Measurements: Height: 5\' 7"  (170.2 cm) Weight: 421 lb 1.3 oz (191 kg) IBW/kg (Calculated) : 61.6   Vital Signs: Temp: 98 F (36.7 C) (11/02 0432) Temp src: Oral (11/02 0432) BP: 156/76 mmHg (11/02 0432) Pulse Rate: 75 (11/02 0432)  Labs:  Recent Labs  04/24/13 0430 04/25/13 0535 04/26/13 0520  HGB  --  11.3* 11.9*  HCT  --  33.5* 35.0*  PLT  --  119* 133*  LABPROT 23.2* 26.0* 26.3*  INR 2.14* 2.48* 2.52*  CREATININE  --  1.00  --     Estimated Creatinine Clearance: 120.5 ml/min (by C-G formula based on Cr of 1).   Medications:  Scheduled:  . antiseptic oral rinse  15 mL Mouth Rinse BID  . aspirin  81 mg Oral Daily  . atorvastatin  10 mg Oral QHS  . budesonide  0.25 mg Nebulization Daily  . busPIRone  7.5 mg Oral BID  . Canagliflozin  100 mg Oral Daily  . cefTRIAXone (ROCEPHIN) IVPB 1 gram/50 mL D5W  1 g Intravenous Q24H  . Chlorhexidine Gluconate Cloth  6 each Topical Q0600  . colchicine  0.6 mg Oral Daily  . docusate sodium  100 mg Oral BID  . DULoxetine  30 mg Oral BID  . famotidine  40 mg Oral BID  . febuxostat  80 mg Oral QHS  . feeding supplement (GLUCERNA SHAKE)  237 mL Oral TID BM  . fesoterodine  4 mg Oral Daily  . gabapentin   100 mg Oral QID  . guaiFENesin  600 mg Oral BID  . insulin aspart  0-20 Units Subcutaneous TID WC  . insulin aspart  0-5 Units Subcutaneous QHS  . insulin glargine  32 Units Subcutaneous BID  . ipratropium  500 mcg Nebulization QID  . meclizine  25 mg Oral QHS  . mupirocin ointment  1 application Nasal BID  . nebivolol  10 mg Oral Daily  . primidone  25 mg Oral Daily  . primidone  75 mg Oral QHS  . saccharomyces boulardii  250 mg Oral BID  . topiramate  50 mg Oral Daily  . Warfarin - Pharmacist Dosing Inpatient   Does not apply q1800    Assessment: 52 y/o F on chronic warfarin for hx afib admitted 04/22/2013   with fever/SIRS. Admit INR 1.96, but pt has not had warfarin in >24 hours, so would likely be therapeutic on home regimen.   PMH: OSA, DM, asthma, thrombocytopenia, gout, GERD, OA, HF, HLP, CKD, afib  Anticoagulation: afib on coumadin, INR remains at goal at 2.48, continue home dose for now Warfarin PTA Dose: 5mg  Mon/Tues/Thurs/Fri, 7.5mg  Wed/Sat/Sun  Infectious Disease: SIRS w/ Pyelonephritis. WBC= WNL, afeb; did  get Bactrim PTA from PCP  Rocephin 10/29>>  10/29 blood: ngtd 10/29 urine: neg  Goal of Therapy:  INR 2-3 Monitor platelets by anticoagulation protocol: Yes   Plan:  1. Warfarin 7.5mg  tonight as per home regimen 2. Daily PT/INR 3. Follow CBC and any signs of bleeding   Thank you for allowing pharmacy to be a part of this patients care team.  Lovenia Kim Pharm.D., BCPS Clinical Pharmacist 04/26/2013 7:43 AM Pager: [phone] Phone: [phone]

## 2013-04-26 NOTE — Discharge Summary (Signed)
DISCHARGE SUMMARY  [name]  MR#: [id number]  DOB:06/04/1961  Date of Admission: 04/22/2013 Date of Discharge: 04/26/2013  Attending Physician:MCCLUNG,JEFFREY T  Patient's YQM:VHQIONG,EXBMWU COLLINS, MD  Consults:  Pulmonary  Disposition: D/C home   Follow-up Appts:     Follow-up Information   Follow up with Astrid Divine, MD. Schedule an appointment as soon as possible for a visit in 5 days.   Specialty:  Family Medicine   Contact information:   [address] [place] [address] [phone]       Follow up with Keep your regular scheduled visits for the monitoring of your warfarin (coumdain) dosing .     Tests Needing Follow-up: -) recheck of potassium is suggested   Discharge Diagnoses: Acute pyelonephritis  Morbid obesity - Body mass index is 65.93 kg/(m^2).  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 PCP gave her Bactrim which she took 2 doses of 12 hrs apart but did not find relief of symptoms.   Hospital Course:  Acute pyelonephritis  - to complete a 10 day course of empiric abx - neither blood cultures nor urine cultures successful in identifying the offending pathogen - clinically improved and able to tolerate oral intake and meds  Morbid obesity - Body mass index is 65.93 kg/(m^2).  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 For pain     albuterol (2.5 MG/3ML) 0.083% nebulizer solution  Commonly known as:  PROVENTIL  Take 2.5 mg by nebulization every 4 (four) hours as needed. For shortness of breath     ALPRAZolam 0.5 MG tablet  Commonly known as:  XANAX  Take 0.5 mg by mouth 2 (two) times daily as needed. For anxiety     ANTIVERT 25 MG tablet  Generic drug:  meclizine  Take 25 mg by mouth at bedtime.     aspirin 81 MG  chewable tablet  Chew 81 mg by mouth daily.     atorvastatin 10 MG tablet  Commonly known as:  LIPITOR  Take 10 mg by mouth at bedtime.     budesonide 0.25 MG/2ML nebulizer solution  Commonly known as:  PULMICORT  Take 2 mLs (0.25 mg total) by nebulization 2 (two) times daily. For shortness of breath.     bumetanide 2 MG tablet  Commonly known as:  BUMEX  Take 2 mg by mouth daily.     busPIRone 5 MG tablet  Commonly known as:  BUSPAR  Take 7.5 mg by mouth 2 (two) times daily.     cefUROXime 500 MG tablet  Commonly known as:  CEFTIN  Take 1 tablet (500 mg total) by mouth 2 (two) times daily with a meal.  Start taking on:   04/27/2013     cholestyramine 4 G packet  Commonly known as:  QUESTRAN  Take 1 packet by mouth as needed. For loose stools, IBS.     clotrimazole 1 % cream  Commonly known as:  LOTRIMIN  Apply 1 application topically daily. To stomach     colchicine 0.6 MG tablet  Take 0.6 mg by mouth daily.     DULoxetine 30 MG capsule  Commonly known as:  CYMBALTA  Take 30 mg by mouth 2 (two) times daily.     ergocalciferol 50000 UNITS capsule  Commonly known as:  VITAMIN D2  Take 50,000 Units by mouth once a week. Take on Fridays     fish oil-omega-3 fatty acids 1000 MG capsule  Take 1 g by mouth 2 (two) times daily.     gabapentin 100 MG capsule  Commonly known as:  NEURONTIN  Take 1 capsule (100 mg total) by mouth 4 (four) times daily.     guaiFENesin 600 MG 12 hr tablet  Commonly known as:  MUCINEX  Take 600 mg by mouth 2 (two) times daily.     HUMULIN R 500 UNIT/ML Soln injection  Generic drug:  insulin regular human CONCENTRATED  Inject 28 Units into the skin 2 (two) times daily before a meal.     HYDROcodone-acetaminophen 5-325 MG per tablet  Commonly known as:  NORCO/VICODIN  Take 1 tablet by mouth every 6 (six) hours as needed for pain.     INVOKANA 100 MG Tabs  Generic drug:  Canagliflozin  Take 100 mg by mouth daily.     ipratropium 0.02 % nebulizer solution  Commonly known as:  ATROVENT  Take 2.5 mLs (500 mcg total) by nebulization 4 (four) times daily.     loratadine 10 MG tablet  Commonly known as:  CLARITIN  Take 10 mg by mouth daily as needed. For allergies     losartan 100 MG tablet  Commonly known as:  COZAAR  Take 100 mg by mouth daily.     metolazone 5 MG tablet  Commonly known as:  ZAROXOLYN  Take 5 mg by mouth See admin instructions. 4 times weekly as needed for edema     multivitamin capsule  Take 1 capsule by mouth daily.     nebivolol 10 MG tablet  Commonly known as:  BYSTOLIC  Take 10 mg by mouth daily.     omeprazole 40 MG capsule   Commonly known as:  PRILOSEC  Take 40 mg by mouth 2 (two) times daily.     potassium chloride SA 20 MEQ tablet  Commonly known as:  K-DUR,KLOR-CON  Take 40 mEq by mouth 3 (  three) times daily.     primidone 50 MG tablet  Commonly known as:  MYSOLINE  Take 25-75 mg by mouth 2 (two) times daily. 25mg  in the morning; 75mg  in the evening     promethazine 25 MG tablet  Commonly known as:  PHENERGAN  Take 25 mg by mouth every 6 (six) hours as needed. For nausea/vomiting     pyridOXINE 100 MG tablet  Commonly known as:  VITAMIN B-6  Take 100 mg by mouth daily.     saccharomyces boulardii 250 MG capsule  Commonly known as:  FLORASTOR  Take 1 capsule (250 mg total) by mouth 2 (two) times daily.     topiramate 50 MG tablet  Commonly known as:  TOPAMAX  Take 1 tablet (50 mg total) by mouth daily.     TOVIAZ 4 MG Tb24 tablet  Generic drug:  fesoterodine  Take 4 mg by mouth daily.     ULORIC 80 MG Tabs  Generic drug:  Febuxostat  Take 80 mg by mouth at bedtime.     VICTOZA 18 MG/3ML Soln injection  Generic drug:  Liraglutide  Inject 1.8 mg into the skin daily.     VITAMIN B COMPLEX PO  Take 1 tablet by mouth daily.     warfarin 5 MG tablet  Commonly known as:  COUMADIN  - Take 5-7.5 mg by mouth daily. Take 1 tablet (5mg ) on Monday, Tuesday, Thursday, Friday.    - Take 1.5 tablet (7.5mg ) on Sunday, Wednesday, and saturday       Day of Discharge BP 112/83  Pulse 66  Temp(Src) 98.4 F (36.9 C) (Oral)  Resp 14  Ht 5\' 7"  (1.702 m)  Wt 191 kg (421 lb 1.3 oz)  BMI 65.93 kg/m2  SpO2 100%  Physical Exam: General: No acute respiratory distress - breath sounds very distant Lungs: Clear to auscultation bilaterally without wheezes or crackles  Cardiovascular: Regular rate and rhythm - very distant heart sounds  Abdomen: morbidly obese, nontender, nondistended, soft, bowel sounds positive, no rebound, no ascites, no appreciable mass Extremities: No significant cyanosis,  clubbing  PROTIME-INR     Status: Abnormal   Collection Time    04/26/13  5:20 AM      Result Value Range   Prothrombin Time 26.3 (*) 11.6 - 15.2 seconds   INR 2.52 (*) 0.00 - 1.49  CBC     Status: Abnormal   Collection Time    04/26/13  5:20 AM      Result Value Range   WBC 6.9  4.0 - 10.5 K/uL   RBC 3.81 (*) 3.87 - 5.11 MIL/uL   Hemoglobin 11.9 (*) 12.0 - 15.0 g/dL   HCT 16.1 (*) 09.6 - 04.5 %   MCV 91.9  78.0 - 100.0 fL   MCH 31.2  26.0 - 34.0 pg   MCHC 34.0  30.0 - 36.0 g/dL   RDW 40.9  81.1 - 91.4 %   Platelets 133 (*) 150 - 400 K/uL    Time spent in discharge (includes decision making & examination of pt): >30  minutes  04/26/2013, 12:32 PM   Lonia Blood, MD [facility] Office  [phone] Pager [phone]  On-Call/Text Page:      [email or web address]      password [facility]

## 2013-04-29 ENCOUNTER — Ambulatory Visit: Payer: Self-pay | Admitting: Nurse Practitioner

## 2013-04-29 LAB — CULTURE, BLOOD (ROUTINE X 2)
Culture: NO GROWTH
Culture: NO GROWTH

## 2013-04-30 ENCOUNTER — Ambulatory Visit: Payer: Medicare Other | Admitting: *Deleted

## 2013-05-10 ENCOUNTER — Other Ambulatory Visit: Payer: Self-pay | Admitting: Neurology

## 2013-05-11 ENCOUNTER — Ambulatory Visit: Payer: Medicare Other | Admitting: Cardiovascular Disease

## 2013-05-11 ENCOUNTER — Other Ambulatory Visit: Payer: Self-pay

## 2013-05-29 ENCOUNTER — Ambulatory Visit: Payer: Medicare Other | Admitting: Neurology

## 2013-06-09 ENCOUNTER — Other Ambulatory Visit: Payer: Self-pay | Admitting: Neurology

## 2013-06-09 ENCOUNTER — Encounter: Payer: Self-pay | Admitting: Neurology

## 2013-06-09 ENCOUNTER — Ambulatory Visit (INDEPENDENT_AMBULATORY_CARE_PROVIDER_SITE_OTHER): Payer: Medicare Other | Admitting: Neurology

## 2013-06-09 VITALS — BP 120/60 | HR 88 | Ht 68.0 in | Wt >= 6400 oz

## 2013-06-09 DIAGNOSIS — E1142 Type 2 diabetes mellitus with diabetic polyneuropathy: Secondary | ICD-10-CM

## 2013-06-09 DIAGNOSIS — E1149 Type 2 diabetes mellitus with other diabetic neurological complication: Secondary | ICD-10-CM

## 2013-06-09 DIAGNOSIS — G250 Essential tremor: Secondary | ICD-10-CM

## 2013-06-09 MED ORDER — PRIMIDONE 50 MG PO TABS: ORAL_TABLET | ORAL | Status: DC

## 2013-06-09 MED ORDER — DULOXETINE HCL 30 MG PO CPEP: 30.0000 mg | ORAL_CAPSULE | Freq: Two times a day (BID) | ORAL | Status: DC

## 2013-06-09 MED ORDER — GABAPENTIN 100 MG PO CAPS: ORAL_CAPSULE | ORAL | Status: DC

## 2013-06-09 MED ORDER — TOPIRAMATE 50 MG PO TABS: 50.0000 mg | ORAL_TABLET | Freq: Every day | ORAL | Status: DC

## 2013-06-09 NOTE — Progress Notes (Signed)
faxed

## 2013-06-09 NOTE — Progress Notes (Signed)
[facility] Clinic Note - Initial Visit   Date: 06/09/2013    ENGLAND GREB MRN: [id number] DOB: Nov 01, 1960   Dear Dr [name]:  Thank you for your kind referral of [name] for consultation of neuropathy. Although her history is well known to you, please allow [name] to reiterate it for the purpose of our medical record. The patient was accompanied to the clinic by self.   History of Present Illness: [name] is a 52 y.o. 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    She was previously a patient of Dr. Sandria Manly, whom she has seen for tremors and peripheral neuropathy for at least the past 10-15 years.  She first started having heaviness of her leg left with spells of numbness/tingling since the early 1990s.  Her numbness/tingling involves the areas distal to the left knee and right foot. She has more numbness and heaviness than tingling.  She does not have burning sensation.  She takes Norco which sometimes helps the pain.  Last winter (December 2013 - April 2014), she fell six times, but has not fallen since then.  She has been ambulating with a rollator since 2012.  She started using a cane in 2009 and prior to that was walking independently.  Currently, she takes Cymbalta 30mg  BID, neurontin 100mg  QID (9am, noon, 5pm, 10pm), topamax 50mg  daily (remote history of kidney stones), and fish oil.  She tried neurontin 600mg , but became very groggy and sleepy.  She has tried Lyrica, but developed joint pain and vision problems.  She feels the most benefit from Cymbalta because it helps with depression.  She reports having decreased  sweating, early satiety, dry eyes and dry mouth.  Also about 10+ years ago, she started having tremors of her hands, worse when she is trying to use her hands.  Symptoms have been present for at least the past 10 years.  She is currently taking Mysoline 50mg  0.5 tablet in the morning and 1.5 tablet in the evening.  She does not feel that it helps her tremors.    Over the past 36-months, she developed bilateral achy joint pain, swelling, numbness/tingling and weakness of her hands.  She has difficulty holding things, cooking, and writing.  Warmth helps her symptoms.  Cold sensation worsened her pain.  She denies any color change.  She saw her Rheumatologist (Dr. Titus Dubin) who says that her symptoms are related to gout.   She was evaluated by Dr. Mikal Plane for neck pain in 2007 and found not to be a surgical candidate.  She has steroid injections in the past.  She has a caregiver that comes 7-days a week for ~5 hours.  She has been on disability due to morbid obesity since 1999.  Out-side paper records, electronic medical record, and images have been reviewed where available and summarized as:  Lab Results  Component Value Date   HGBA1C 6.1* 04/23/2013   Lab Results  Component Value Date   TSH 1.657 04/23/2013     Past Medical History  Diagnosis Date  . OSA (obstructive sleep apnea)   . Morbid obesity   . Depressive disorder, not elsewhere classified   . Diabetes mellitus   . Asthma   . Dyslipidemia   . Clostridium  difficile enterocolitis 2012  . Splenomegaly 06/01/2011  . Thrombocytopenia 06/01/2011  . Hepatosplenomegaly   . Gout   . GERD (gastroesophageal reflux disease)   . Osteoarthritis   . Polyneuropathy   . Urinary incontinence   . Amenorrhea   . CHF (congestive heart failure)   . Hyperlipidemia   . Leg swelling   . Nausea   . Weakness   . Palpitations   . PONV (postoperative nausea and vomiting)   . Anginal pain     no chest pain in years  . Dysrhythmia     heart  palpations  . Anxiety   . Pneumonia   . Shortness of breath   . Chronic kidney disease     overactive bladder  . Peripheral neuropathy   . DVT (deep venous thrombosis)     Past Surgical History  Procedure Laterality Date  . Cholecystectomy    . Tracheostomy  2001  . Endometrial ablation    . Skin graft    . Finger surgery      ring finger on right hand  . Esophageal dilation  2003  . Breast biopsy      needle core, left  . Dilation and curettage of uterus      times 2  . Tracheal dilitation  01/24/2012    Procedure: TRACHEAL DILITATION;  Surgeon: [name], MD;  Location[facility] OR;  Service: ENT;  Laterality: N/A;  Trache change with possible dilation, from size 6 to size 7 uncuffed     Medications:  Current Outpatient Prescriptions on File Prior to Visit  Medication Sig Dispense Refill  . acetaminophen (TYLENOL) 500 MG tablet Take 1,000 mg by mouth every 6 (six) hours as needed. For pain      . albuterol (PROVENTIL) (2.5 MG/3ML) 0.083% nebulizer solution Take 2.5 mg by nebulization every 4 (four) hours as needed. For shortness of breath      . ALPRAZolam (XANAX) 0.5 MG tablet Take 0.5 mg by mouth 2 (two) times daily as needed. For anxiety      . aspirin 81 MG chewable tablet Chew 81 mg by mouth daily.      [name] atorvastatin (LIPITOR) 10 MG tablet Take 10 mg by mouth at bedtime.       . B Complex Vitamins (VITAMIN B COMPLEX PO) Take 1 tablet by mouth daily.       . budesonide (PULMICORT) 0.25 MG/2ML nebulizer solution Take 2 mLs (0.25 mg total) by nebulization 2 (two) times daily. For shortness of breath.  60 mL  12  . bumetanide (BUMEX) 2 MG tablet Take 2 mg by mouth daily.       . busPIRone (BUSPAR) 5 MG tablet Take 7.5 mg by mouth 2 (two) times daily.       . Canagliflozin (INVOKANA) 100 MG TABS Take 100 mg by mouth daily.       . cefUROXime (CEFTIN) 500 MG tablet Take 1 tablet (500 mg total) by mouth 2 (two) times daily with a meal.  10 tablet  0  . cholestyramine (QUESTRAN) 4  G packet Take 1 packet by mouth as needed. For loose stools, IBS.      . clotrimazole (LOTRIMIN) 1 % cream Apply 1 application topically daily. To stomach      . colchicine 0.6 MG tablet Take 0.6 mg by mouth daily.      . DULoxetine (CYMBALTA) 30 MG capsule Take 30 mg by mouth 2 (two) times daily.      [name]  DULoxetine (CYMBALTA) 30 MG capsule TAKE 1 CAPSULE (30 MG TOTAL) BY MOUTH 2 (TWO) TIMES DAILY.  60 capsule  1  . ergocalciferol (VITAMIN D2) 50000 UNITS capsule Take 50,000 Units by mouth once a week. Take on Fridays      . Febuxostat (ULORIC) 80 MG TABS Take 80 mg by mouth at bedtime.       . fesoterodine (TOVIAZ) 4 MG TB24 Take 4 mg by mouth daily.        . fish oil-omega-3 fatty acids 1000 MG capsule Take 1 g by mouth 2 (two) times daily.       [name] gabapentin (NEURONTIN) 100 MG capsule TAKE 1 CAPSULE (100 MG TOTAL) BY MOUTH 4 (FOUR) TIMES DAILY.  120 capsule  1  . guaiFENesin (MUCINEX) 600 MG 12 hr tablet Take 600 mg by mouth 2 (two) times daily.       [name] HYDROcodone-acetaminophen (NORCO/VICODIN) 5-325 MG per tablet Take 1 tablet by mouth every 6 (six) hours as needed for pain.      [name] insulin regular human CONCENTRATED (HUMULIN R) 500 UNIT/ML SOLN injection Inject 28 Units into the skin 2 (two) times daily before a meal.       . ipratropium (ATROVENT) 0.02 % nebulizer solution Take 2.5 mLs (500 mcg total) by nebulization 4 (four) times daily.  75 mL  12  . Liraglutide (VICTOZA) 18 MG/3ML SOLN injection Inject 1.8 mg into the skin daily.      [name] loratadine (CLARITIN) 10 MG tablet Take 10 mg by mouth daily as needed. For allergies      . losartan (COZAAR) 100 MG tablet Take 100 mg by mouth daily.      . meclizine (ANTIVERT) 25 MG tablet Take 25 mg by mouth at bedtime.       . metolazone (ZAROXOLYN) 5 MG tablet Take 5 mg by mouth See admin instructions. 4 times weekly as needed for edema      . Multiple Vitamin (MULTIVITAMIN) capsule Take 1 capsule by mouth daily.        . nebivolol (BYSTOLIC) 10 MG  tablet Take 10 mg by mouth daily.      [name] omeprazole (PRILOSEC) 40 MG capsule Take 40 mg by mouth 2 (two) times daily.       . potassium chloride SA (K-DUR,KLOR-CON) 20 MEQ tablet Take 40 mEq by mouth 3 (three) times daily.       . primidone (MYSOLINE) 50 MG tablet Take 25-75 mg by mouth 2 (two) times daily. 25mg  in the morning; 75mg  in the evening      . primidone (MYSOLINE) 50 MG tablet TAKE ONE-HALF TABLET BY MOUTH EVERY MORNING AND ONE & ONE-HALF TABLETS EVERY EVENING  60 tablet  1  . promethazine (PHENERGAN) 25 MG tablet Take 25 mg by mouth every 6 (six) hours as needed. For nausea/vomiting      . pyridOXINE (VITAMIN B-6) 100 MG tablet Take 100 mg by mouth daily.      [name] saccharomyces boulardii (FLORASTOR) 250 MG capsule Take 1 capsule (250 mg total) by mouth 2 (two) times daily.  30 capsule  0  . topiramate (TOPAMAX) 50 MG tablet Take 1 tablet (50 mg total) by mouth daily.  30 tablet  0  . warfarin (COUMADIN) 5 MG tablet Take 5-7.5 mg by mouth daily. Take 1 tablet (5mg ) on Monday, Tuesday, Thursday, Friday.   Take 1.5 tablet (7.5mg ) on Sunday, Wednesday, and saturday       No current facility-administered medications  on file prior to visit.    Allergies:  Allergies  Allergen Reactions  . Molds & Smuts Shortness Of Breath and Rash  . Nitroglycerin Er Nausea And Vomiting  . Accolate [Zafirlukast] Other (See Comments)    Reaction unknown  . Allopurinol Other (See Comments)    GI problems   . Ciprofloxacin Other (See Comments)    GI problems  . Codeine Other (See Comments)    Heart problems  . Doxycycline Other (See Comments)    Gastric problems  . Fexofenadine Other (See Comments)    Hurts joints/pain  . Morphine Other (See Comments)    unknown  . Sulfa Drugs Cross Reactors Nausea And Vomiting    nausea  . Sulfonamide Derivatives Nausea Only    Family History: Family History  Problem Relation Age of Onset  . Diabetes Father   . Hypertension Father   . Dementia Father      Deceased, 39  . Pneumonia Father   . Heart disease Mother   . Clotting disorder Mother   . Hypertension Mother   . Heart attack Mother     Deceased, 32  . Multiple myeloma Paternal Grandmother   . Cancer Paternal Grandmother     multiple myoloma  . Heart disease Paternal Grandfather   . Kidney disease Maternal Grandmother   . Hypertension Sister   . Cancer Paternal Aunt     breast  . Cancer Cousin     breast - all three    Social History: History   Social History  . Marital Status: Single    Spouse Name: N/A    Number of Children: 0  . Years of Education: N/A   Occupational History  . disabled    Social History Main Topics  . Smoking status: Never Smoker   . Smokeless tobacco: Never Used  . Alcohol Use: No     Comment: occasional - once or twice a year  . Drug Use: No  . Sexual Activity: Yes    Birth Control/ Protection: Condom   Other Topics Concern  . Not on file   Social History Narrative   Lives alone.  She has been on disability since 1999 for morbid obesity.    Single, no children.   She has a caregiver that comes 7-days a week, ~5 hours each day.    Review of Systems:  CONSTITUTIONAL: No fevers, chills, night sweats, or weight loss.   EYES: No visual changes or eye pain ENT: No hearing changes.  No history of nose bleeds.   RESPIRATORY: No cough, wheezing +shortness of breath.   CARDIOVASCULAR: Negative for chest pain, + palpitations.   GI: +abdominal discomfort, blood in stools or black stools.     GU:  + overactive bladder .   MUSCLOSKELETAL: +No history of joint pain or swelling.  +myalgias.   SKIN: +for lesions, rash, and itching.   HEMATOLOGY/ONCOLOGY: Negative for prolonged bleeding, bruising easily, and swollen nodes. ENDOCRINE: Negative for cold or heat intolerance, polydipsia or goiter.   PSYCH:  +depression or anxiety symptoms.   NEURO: As Above.   Vital Signs:  BP 120/60  Pulse 88  Ht 5\' 8"  (1.727 m)  Wt 421 lb (190.964 kg)  BMI  64.03 kg/m2 Pain Scale: 8-9 on a scale of 0-10   General Medical Exam:   General:  Morbidly obese, well appearing, comfortable.   Extremities:  Bilateral leg edema with venostasis skin changes   Neurological Exam: MENTAL STATUS including orientation to time, place,  person, recent and remote memory, attention span and concentration, language, and fund of knowledge is normal.  Speech is not dysarthric.  CRANIAL NERVES: II:  No visual field defects.  Unremarkable fundi.   III-IV-VI: Pupils equal round and reactive to light.  Normal conjugate, extra-ocular eye movements in all directions of gaze.  No nystagmus.  No ptosis. V:  Normal facial sensation.   VII:  Normal facial symmetry and movements.  No palmomental reflex bilaterally.  VIII:  Normal hearing and vestibular function.   IX-X:  Normal palatal movement.   XI:  Normal shoulder shrug and head rotation.   XII:  Normal tongue strength and range of motion, no deviation or fasciculation.  MOTOR: Mild bilateral intention tremor of the hands.   No atrophy or fasciculations.  No pronator drift.  Tone is normal.    Right Upper Extremity:    Left Upper Extremity:    Deltoid  5/5   Deltoid  5/5   Biceps  5/5   Biceps  5/5   Triceps  5/5   Triceps  5/5   Wrist extensors  5/5   Wrist extensors  5/5   Wrist flexors  5/5   Wrist flexors  5/5   Finger extensors  5/5   Finger extensors  5/5   Finger flexors  5/5   Finger flexors  5/5   Dorsal interossei  4/5   Dorsal interossei  4/5   Abductor pollicis  5/5   Abductor pollicis  5/5   Tone (Ashworth scale)  0  Tone (Ashworth scale)  0   Right Lower Extremity:    Left Lower Extremity:    Hip flexors  5/5   Hip flexors  5/5   Hip extensors  5/5   Hip extensors  5/5   Knee flexors  5/5   Knee flexors  5/5   Knee extensors  5/5   Knee extensors  5/5   Dorsiflexors  4-/5   Dorsiflexors  4-/5   Plantarflexors  4/5   Plantarflexors  4/5   Toe extensors  2/5   Toe extensors  2/5   Toe flexors   2/5   Toe flexors  2/5   Tone (Ashworth scale)  0  Tone (Ashworth scale)  0   MSRs:  Right                                                                 Left brachioradialis 2+  brachioradialis 2+  biceps 1+  biceps 1+  triceps 1+  triceps 1+  patellar 0  Patellar 0  ankle jerk 0  ankle jerk 0  Hoffman no  Hoffman no  plantar response mute  plantar response mute   SENSORY:  All sensory modalities are reduced in gradient pattern distal to the knees and mild changes in the hands.  Reduced vibration at knees and absent distal to ankles bilaterally. Pin prick is reduced in the hands and absent distal to mid-calf bilaterally.  Impaired proprioception at the great toe.    COORDINATION/GAIT: Normal finger-to- nose-finger.  Moderately slowing of toe tapping bilaterally.  Wide-based gait, assisted with rollator.    IMPRESSION: 1.  Length-dependent generalized sensorimotor peripheral neuropathy due to diabetes  - Predominately large fiber > small fiber, at the  levels of the knees with mild sensory loss in the hands also and mild bilateral foot drop  - Currently taking neurontin 400mg /d, cymbalta 60mg /d, TPM 50mg /d.  Previously tried Lyrica (vision changes, swelling).  Will try to optimize neurontin.  - Given her cardiac co-morbidities and morbid obesity, do not feel she is a good candidate for TCAs.  Going forward, may consider trial of alpha lipoic acid  - I had extensive discussion regarding the pathophysiology of neuropathy, clinical course, management options, and prognosis  - Encouraged patient to continue tight glycemic control 2.  Benign essential tremors  - Moderately controlled on primidone 100mg /d  - Not a candidate for propranolol due to history of asthma and 3.  Hypoventilation syndrome due to obesity  - s/p trach, vent-dependent at night 4.  Depression 5.  HTN 6.  Asthma 7.  Gout 8.  DVT on coumadin   PLAN/RECOMMENDATIONS:  1.  Will try to optimize neurontin for daytime  symptoms. Take neurontin as follows:    9AM  3PM  8pm  Week 1: 100mg   100mg   200mg   Week 2: 100mg   200mg   200mg   2.  Start taking mysoline 50mg  twice daily 3.  Continue Cymbalta 30mg  BID 4.  Continue Topamax 50mg  daily 5.  As I am making medication changes, I am ok with her fish oils and B-complex vitamins, but may try to streamline her medications going forward 5.  Return to clinic in 6-weeks  The duration of this appointment visit was 45 minutes of face-to-face time with the patient.  Greater than 50% of this time was spent in counseling, explanation of diagnosis, planning of further management, and coordination of care.   Thank you for allowing me to participate in patient's care.  If I can answer any additional questions, I would be pleased to do so.    Sincerely,    Tishanna Dunford K. Allena Katz, DO

## 2013-06-09 NOTE — Patient Instructions (Addendum)
1.   Take neurontin as follows:    9AM  3PM  8pm  Week1: 100mg   100mg   200mg   Week 2: 100mg   200mg   200mg   2.  Continue Cymbalta 30mg  twice daily 3.  Continue Topamax 50mg  daily 4.  In two weeks, start taking Mysoline 50mg  twice daily 5.  Return to clinic in 6-weeks

## 2013-06-10 ENCOUNTER — Other Ambulatory Visit: Payer: Self-pay | Admitting: *Deleted

## 2013-06-10 MED ORDER — LOSARTAN POTASSIUM 100 MG PO TABS: ORAL_TABLET | ORAL | Status: DC

## 2013-06-10 MED ORDER — NEBIVOLOL HCL 10 MG PO TABS: 10.0000 mg | ORAL_TABLET | Freq: Every day | ORAL | Status: DC

## 2013-06-10 NOTE — Telephone Encounter (Signed)
Rx was sent to pharmacy electronically. 

## 2013-06-10 NOTE — Telephone Encounter (Signed)
Is it ok to refill? 

## 2013-06-10 NOTE — Telephone Encounter (Signed)
We did not talk about meclizine at her last visit.  Not sure why she takes this.  She should ask her prescribing physician for a refill since I did not start her on this.  Donika K. Allena Katz, DO

## 2013-07-08 ENCOUNTER — Other Ambulatory Visit: Payer: Self-pay | Admitting: Cardiovascular Disease

## 2013-07-08 NOTE — Telephone Encounter (Signed)
Rx was sent to pharmacy electronically. 

## 2013-07-21 ENCOUNTER — Ambulatory Visit: Payer: Medicare Other | Admitting: Neurology

## 2013-07-23 ENCOUNTER — Encounter: Payer: Self-pay | Admitting: Interventional Cardiology

## 2013-07-28 ENCOUNTER — Ambulatory Visit: Payer: Medicare Other | Admitting: Cardiovascular Disease

## 2013-07-29 ENCOUNTER — Ambulatory Visit: Payer: Medicare Other | Admitting: Interventional Cardiology

## 2013-07-29 ENCOUNTER — Encounter: Payer: Self-pay | Admitting: Interventional Cardiology

## 2013-07-29 ENCOUNTER — Ambulatory Visit (INDEPENDENT_AMBULATORY_CARE_PROVIDER_SITE_OTHER): Payer: Medicare Other | Admitting: Interventional Cardiology

## 2013-07-29 VITALS — BP 147/79 | HR 73 | Ht 68.0 in | Wt >= 6400 oz

## 2013-07-29 DIAGNOSIS — I5032 Chronic diastolic (congestive) heart failure: Secondary | ICD-10-CM

## 2013-07-29 DIAGNOSIS — Z930 Tracheostomy status: Secondary | ICD-10-CM

## 2013-07-29 DIAGNOSIS — E782 Mixed hyperlipidemia: Secondary | ICD-10-CM

## 2013-07-29 DIAGNOSIS — Z7901 Long term (current) use of anticoagulants: Secondary | ICD-10-CM

## 2013-07-29 DIAGNOSIS — I10 Essential (primary) hypertension: Secondary | ICD-10-CM

## 2013-07-29 NOTE — Progress Notes (Signed)
Patient ID: [name], female   DOB: 06-Aug-1960, 53 y.o.   MRN: [id number]     Patient ID: [name] MRN: [id number] DOB/AGE: 08/17/1960 53 y.o.   Referring Physician  Dr. Kelton Pillar   Reason for Consultation  CHF  HPI: 53 y/o who has obesity -hypoventilation syndrome.  She is trach dependent.  She has morbid obesity, hypertension, DM, asthma.  She has had diastolic dysfunction as well.  She had an echo in 2010 showing normal LV function.  She has never smoked.  Mother had MI.  Father had CVA.  Sister with HTN.  She is on multiple meds for DM.  Last A1C was 6.2 with Dr. Laurann Montana.  Overall, she is feeling at baseline. She does have chronic lower extremity swelling but is stable. Her INR has been well-controlled. She states she is getting checked about every 2 months. Her primary care doctor typically checks her INR. She follows a strict diet to keep her Coumadin therapeutic.  She was hospitalized a few months ago for pyelonephritis. She was in the hospital for 5-6 days. She states she has not had any cardiac issues requiring hospitalization for several years. She does have a significant effect from the diuretics that she takes.   Current Outpatient Prescriptions  Medication Sig Dispense Refill  . acetaminophen (TYLENOL) 500 MG tablet Take 1,000 mg by mouth every 6 (six) hours as needed. For pain      . albuterol (PROVENTIL) (2.5 MG/3ML) 0.083% nebulizer solution Take 2.5 mg by nebulization every 4 (four) hours as needed. For shortness of breath      . ALPRAZolam (XANAX) 0.5 MG tablet Take 0.5 mg by mouth 2 (two) times daily as needed. For anxiety      . aspirin 81 MG chewable tablet Chew 81 mg by mouth daily.      . bumetanide (BUMEX) 2 MG tablet Take 2 mg by mouth daily.       . busPIRone (BUSPAR) 5 MG tablet Take 7.5 mg by mouth 2 (two) times daily.       [name] BYSTOLIC 10 MG tablet TAKE 1 TABLET (10 MG TOTAL) BY MOUTH DAILY. <PLEASE SCHEDULE APPOINTMENT>  15 tablet  0  .  Canagliflozin (INVOKANA) 100 MG TABS Take 100 mg by mouth daily.       . cefUROXime (CEFTIN) 500 MG tablet Take 1 tablet (500 mg total) by mouth 2 (two) times daily with a meal.  10 tablet  0  . cholestyramine (QUESTRAN) 4 G packet Take 1 packet by mouth as needed. For loose stools, IBS.      . clotrimazole (LOTRIMIN) 1 % cream Apply 1 application topically daily. To stomach      . colchicine 0.6 MG tablet Take 0.6 mg by mouth daily.      . DULoxetine (CYMBALTA) 30 MG capsule Take 1 capsule (30 mg total) by mouth 2 (two) times daily.  180 capsule  3  . ergocalciferol (VITAMIN D2) 50000 UNITS capsule Take 50,000 Units by mouth once a week. Take on Fridays      . Febuxostat (ULORIC) 80 MG TABS Take 80 mg by mouth at bedtime.       . fesoterodine (TOVIAZ) 4 MG TB24 Take 4 mg by mouth daily.        . fish oil-omega-3 fatty acids 1000 MG capsule Take 1 g by mouth 2 (two) times daily.       [name] gabapentin (NEURONTIN) 100 MG capsule Take one tablet in  the morning, 2 tablets in the afternoon, and 2 tablets at bedtime  480 capsule  3  . guaiFENesin (MUCINEX) 600 MG 12 hr tablet Take 600 mg by mouth 2 (two) times daily.       [name] HYDROcodone-acetaminophen (NORCO/VICODIN) 5-325 MG per tablet Take 1 tablet by mouth every 6 (six) hours as needed for pain.      [name] insulin regular human CONCENTRATED (HUMULIN R) 500 UNIT/ML SOLN injection Inject 28 Units into the skin 2 (two) times daily before a meal.       . ipratropium (ATROVENT) 0.02 % nebulizer solution Take 2.5 mLs (500 mcg total) by nebulization 4 (four) times daily.  75 mL  12  . Liraglutide (VICTOZA) 18 MG/3ML SOLN injection Inject 1.8 mg into the skin daily.      [name] loratadine (CLARITIN) 10 MG tablet Take 10 mg by mouth daily as needed. For allergies      . losartan (COZAAR) 100 MG tablet Take 1 tablet (149m) by mouth daily. <please schedule appointment>  30 tablet  0  . meclizine (ANTIVERT) 25 MG tablet Take 25 mg by mouth at bedtime.       . metolazone  (ZAROXOLYN) 5 MG tablet Take 5 mg by mouth See admin instructions. 4 times weekly as needed for edema      . Multiple Vitamin (MULTIVITAMIN) capsule Take 1 capsule by mouth daily.        .Marland Kitchenomeprazole (PRILOSEC) 40 MG capsule Take 40 mg by mouth 2 (two) times daily.       . potassium chloride SA (K-DUR,KLOR-CON) 20 MEQ tablet Take 40 mEq by mouth 3 (three) times daily.       . primidone (MYSOLINE) 50 MG tablet Take one tablet twice daily  240 tablet  3  . promethazine (PHENERGAN) 25 MG tablet Take 25 mg by mouth every 6 (six) hours as needed. For nausea/vomiting      . pyridOXINE (VITAMIN B-6) 100 MG tablet Take 100 mg by mouth daily.      .Marland Kitchensaccharomyces boulardii (FLORASTOR) 250 MG capsule Take 1 capsule (250 mg total) by mouth 2 (two) times daily.  30 capsule  0  . topiramate (TOPAMAX) 50 MG tablet Take 1 tablet (50 mg total) by mouth daily.  90 tablet  3  . warfarin (COUMADIN) 5 MG tablet Take 5-7.5 mg by mouth daily. Take 1 tablet (5578m on Monday, Tuesday, Thursday, Friday.   Take 1.5 tablet (7.78m83mon Sunday, Wednesday, and saturday      . atorvastatin (LIPITOR) 10 MG tablet Take 10 mg by mouth at bedtime.       . B Complex Vitamins (VITAMIN B COMPLEX PO) Take 1 tablet by mouth daily.       . budesonide (PULMICORT) 0.25 MG/2ML nebulizer solution Take 2 mLs (0.25 mg total) by nebulization 2 (two) times daily. For shortness of breath.  60 mL  12   No current facility-administered medications for this visit.   Past Medical History  Diagnosis Date  . OSA (obstructive sleep apnea)   . Morbid obesity   . Depressive disorder, not elsewhere classified   . Diabetes mellitus   . Asthma   . Dyslipidemia   . Clostridium difficile enterocolitis 2012  . Splenomegaly 06/01/2011  . Thrombocytopenia 06/01/2011  . Hepatosplenomegaly   . Gout   . GERD (gastroesophageal reflux disease)   . Osteoarthritis   . Polyneuropathy   . Urinary incontinence   . Amenorrhea   . CHF (congestive  heart failure)     . Hyperlipidemia   . Leg swelling   . Nausea   . Weakness   . Palpitations   . PONV (postoperative nausea and vomiting)   . Anginal pain     no chest pain in years  . Dysrhythmia     heart palpations  . Anxiety   . Pneumonia   . Shortness of breath   . Chronic kidney disease     overactive bladder  . Peripheral neuropathy   . DVT (deep venous thrombosis)     Family History  Problem Relation Age of Onset  . Diabetes Father   . Hypertension Father   . Dementia Father     Deceased, 67  . Pneumonia Father   . Heart disease Mother   . Clotting disorder Mother   . Hypertension Mother   . Heart attack Mother     Deceased, 19  . Multiple myeloma Paternal Grandmother   . Cancer Paternal Grandmother     multiple myoloma  . Heart disease Paternal Grandfather   . Kidney disease Maternal Grandmother   . Hypertension Sister   . Cancer Paternal Aunt     breast  . Cancer Cousin     breast - all three    History   Social History  . Marital Status: Single    Spouse Name: N/A    Number of Children: 0  . Years of Education: N/A   Occupational History  . disabled    Social History Main Topics  . Smoking status: Never Smoker   . Smokeless tobacco: Never Used  . Alcohol Use: No     Comment: occasional - once or twice a year  . Drug Use: No  . Sexual Activity: Yes    Birth Control/ Protection: Condom   Other Topics Concern  . Not on file   Social History Narrative   Lives alone.  She has been on disability since 1999 for morbid obesity.    Single, no children.   She has a caregiver that comes 7-days a week, ~5 hours each day.    Past Surgical History  Procedure Laterality Date  . Cholecystectomy    . Tracheostomy  2001  . Endometrial ablation    . Skin graft    . Finger surgery      ring finger on right hand  . Esophageal dilation  2003  . Breast biopsy      needle core, left  . Dilation and curettage of uterus      times 2  . Tracheal dilitation  01/24/2012     Procedure: TRACHEAL DILITATION;  Surgeon: Melissa Montane, MD;  Location: [facility];  Service: ENT;  Laterality: N/A;  Trache change with possible dilation, from size 6 to size 7 uncuffed      (Not in a hospital admission)  Review of systems complete and found to be negative unless listed above .  No nausea, vomiting.  No fever chills, No focal weakness,  No palpitations.  Physical Exam: Filed Vitals:   07/29/13 0815  BP: 147/79  Pulse: 73    Weight: 419 lb (190.057 kg)  Physical exam:  [place]/AT, orbidly obese EOMI; trach No JVD, No carotid bruit RRR S1S2  No wheezing Soft. NT, nondistended Bilateral pitting edema L>R, discoloration, . No focal motor or sensory deficits Normal affect  Labs:   Lab Results  Component Value Date   WBC 6.9 04/26/2013   HGB 11.9* 04/26/2013   HCT 35.0*  04/26/2013   MCV 91.9 04/26/2013   PLT 133* 04/26/2013   No results found for this basename: NA, K, CL, CO2, BUN, CREATININE, CALCIUM, LABALBU, PROT, BILITOT, ALKPHOS, ALT, AST, GLUCOSE,  in the last 168 hours Lab Results  Component Value Date   CKTOTAL 95 02/16/2009   CKMB 1.3 02/16/2009   TROPONINI  Value: 0.02        NO INDICATION OF MYOCARDIAL INJURY. 02/16/2009    Lab Results  Component Value Date   CHOL  Value: 138        ATP III CLASSIFICATION:  <200     mg/dL   Desirable  200-239  mg/dL   Borderline High  >=240    mg/dL   High        02/15/2009   CHOL  Value: 107        ATP III CLASSIFICATION:  <200     mg/dL   Desirable  200-239  mg/dL   Borderline High  >=240    mg/dL   High 02/07/2007   Lab Results  Component Value Date   HDL 28* 02/15/2009   HDL 20* 02/07/2007   Lab Results  Component Value Date   LDLCALC  Value: 41        Total Cholesterol/HDL:CHD Risk Coronary Heart Disease Risk Table                     Men   Women  1/2 Average Risk   3.4   3.3  Average Risk       5.0   4.4  2 X Average Risk   9.6   7.1  3 X Average Risk  23.4   11.0        Use the calculated Patient Ratio above and the CHD  Risk Table to determine the patient's CHD Risk.        ATP III CLASSIFICATION (LDL):  <100     mg/dL   Optimal  100-129  mg/dL   Near or Above                    Optimal  130-159  mg/dL   Borderline  160-189  mg/dL   High  >190     mg/dL   Very High 02/15/2009   LDLCALC  Value: 44        Total Cholesterol/HDL:CHD Risk Coronary Heart Disease Risk Table                     Men   Women  1/2 Average Risk   3.4   3.3 02/07/2007   Lab Results  Component Value Date   TRIG 343* 02/15/2009   TRIG 216* 02/07/2007   Lab Results  Component Value Date   CHOLHDL 4.9 02/15/2009   CHOLHDL 5.4 02/07/2007   No results found for this basename: LDLDIRECT       EKG: 2013: PRWP; todAy NSR, NSST changes  ASSESSMENT AND PLAN:  Diastolic heart failure: She feels that she is euvolemic at this time. Continue current dose of diuretics. Creatinine was last checked at the end of January, 2015 and was 1.16. This has been stable for her.    DVT: On chronic warfarin. She does not need aspirin. Will stop at this time.  HTN: Mildly elevated today. She states is usually better controlled outside the doctor's office. Target blood pressure less than 130/80.  Hyperlipidemia: Continue Lipitor. Last HDL was 36. LDL 46. Triglycerides 299 in  January 2015.  Triglycerides are high. Consider increasing dose of fish oil to 2 g twice a day.  Signed:   Mina Marble, MD, [facility] 07/29/2013, 9:08 AM

## 2013-07-29 NOTE — Patient Instructions (Addendum)
Your physician has recommended you make the following change in your medication:   1. Stop ASA 81 mg.  Continue all other medications as directed.   Your physician wants you to follow-up in: 1 year with Dr. Irish Lack. You will receive a reminder letter in the mail two months in advance. If you don't receive a letter, please call our office to schedule the follow-up appointment.

## 2013-08-01 ENCOUNTER — Emergency Department (HOSPITAL_COMMUNITY)
Admission: EM | Admit: 2013-08-01 | Discharge: 2013-08-02 | Disposition: A | Discharge status: Home / Self Care | Payer: Medicare Other | Attending: Emergency Medicine | Admitting: Emergency Medicine

## 2013-08-01 ENCOUNTER — Emergency Department (HOSPITAL_COMMUNITY): Payer: Medicare Other

## 2013-08-01 ENCOUNTER — Encounter (HOSPITAL_COMMUNITY): Payer: Self-pay | Admitting: Emergency Medicine

## 2013-08-01 DIAGNOSIS — Z8701 Personal history of pneumonia (recurrent): Secondary | ICD-10-CM | POA: Diagnosis not present

## 2013-08-01 DIAGNOSIS — Z7901 Long term (current) use of anticoagulants: Secondary | ICD-10-CM | POA: Insufficient documentation

## 2013-08-01 DIAGNOSIS — I209 Angina pectoris, unspecified: Secondary | ICD-10-CM | POA: Diagnosis not present

## 2013-08-01 DIAGNOSIS — M1990 Unspecified osteoarthritis, unspecified site: Secondary | ICD-10-CM | POA: Diagnosis not present

## 2013-08-01 DIAGNOSIS — Z8619 Personal history of other infectious and parasitic diseases: Secondary | ICD-10-CM | POA: Insufficient documentation

## 2013-08-01 DIAGNOSIS — G4733 Obstructive sleep apnea (adult) (pediatric): Secondary | ICD-10-CM | POA: Diagnosis not present

## 2013-08-01 DIAGNOSIS — E785 Hyperlipidemia, unspecified: Secondary | ICD-10-CM | POA: Diagnosis not present

## 2013-08-01 DIAGNOSIS — I129 Hypertensive chronic kidney disease with stage 1 through stage 4 chronic kidney disease, or unspecified chronic kidney disease: Secondary | ICD-10-CM | POA: Diagnosis not present

## 2013-08-01 DIAGNOSIS — F411 Generalized anxiety disorder: Secondary | ICD-10-CM | POA: Diagnosis not present

## 2013-08-01 DIAGNOSIS — K219 Gastro-esophageal reflux disease without esophagitis: Secondary | ICD-10-CM | POA: Insufficient documentation

## 2013-08-01 DIAGNOSIS — N189 Chronic kidney disease, unspecified: Secondary | ICD-10-CM | POA: Insufficient documentation

## 2013-08-01 DIAGNOSIS — G609 Hereditary and idiopathic neuropathy, unspecified: Secondary | ICD-10-CM | POA: Diagnosis not present

## 2013-08-01 DIAGNOSIS — E876 Hypokalemia: Secondary | ICD-10-CM | POA: Insufficient documentation

## 2013-08-01 DIAGNOSIS — IMO0002 Reserved for concepts with insufficient information to code with codable children: Secondary | ICD-10-CM | POA: Diagnosis not present

## 2013-08-01 DIAGNOSIS — Z86718 Personal history of other venous thrombosis and embolism: Secondary | ICD-10-CM | POA: Diagnosis not present

## 2013-08-01 DIAGNOSIS — J45909 Unspecified asthma, uncomplicated: Secondary | ICD-10-CM | POA: Insufficient documentation

## 2013-08-01 DIAGNOSIS — E119 Type 2 diabetes mellitus without complications: Secondary | ICD-10-CM | POA: Diagnosis not present

## 2013-08-01 DIAGNOSIS — M109 Gout, unspecified: Secondary | ICD-10-CM | POA: Insufficient documentation

## 2013-08-01 DIAGNOSIS — F329 Major depressive disorder, single episode, unspecified: Secondary | ICD-10-CM | POA: Diagnosis not present

## 2013-08-01 DIAGNOSIS — F3289 Other specified depressive episodes: Secondary | ICD-10-CM | POA: Diagnosis not present

## 2013-08-01 DIAGNOSIS — I8310 Varicose veins of unspecified lower extremity with inflammation: Secondary | ICD-10-CM | POA: Diagnosis not present

## 2013-08-01 DIAGNOSIS — R509 Fever, unspecified: Secondary | ICD-10-CM | POA: Diagnosis present

## 2013-08-01 DIAGNOSIS — I509 Heart failure, unspecified: Secondary | ICD-10-CM | POA: Diagnosis not present

## 2013-08-01 DIAGNOSIS — Z8742 Personal history of other diseases of the female genital tract: Secondary | ICD-10-CM | POA: Insufficient documentation

## 2013-08-01 DIAGNOSIS — R350 Frequency of micturition: Secondary | ICD-10-CM | POA: Diagnosis not present

## 2013-08-01 DIAGNOSIS — R300 Dysuria: Secondary | ICD-10-CM | POA: Diagnosis not present

## 2013-08-01 DIAGNOSIS — B9789 Other viral agents as the cause of diseases classified elsewhere: Secondary | ICD-10-CM | POA: Diagnosis not present

## 2013-08-01 DIAGNOSIS — Z794 Long term (current) use of insulin: Secondary | ICD-10-CM | POA: Diagnosis not present

## 2013-08-01 DIAGNOSIS — B349 Viral infection, unspecified: Secondary | ICD-10-CM

## 2013-08-01 DIAGNOSIS — Z79899 Other long term (current) drug therapy: Secondary | ICD-10-CM | POA: Diagnosis not present

## 2013-08-01 DIAGNOSIS — Z862 Personal history of diseases of the blood and blood-forming organs and certain disorders involving the immune mechanism: Secondary | ICD-10-CM | POA: Insufficient documentation

## 2013-08-01 LAB — BASIC METABOLIC PANEL
BUN: 14 mg/dL (ref 6–23)
CO2: 25 meq/L (ref 19–32)
CREATININE: 1.05 mg/dL (ref 0.50–1.10)
Calcium: 8.6 mg/dL (ref 8.4–10.5)
Chloride: 95 mEq/L — ABNORMAL LOW (ref 96–112)
GFR calc non Af Amer: 60 mL/min — ABNORMAL LOW (ref >90)
GFR, EST AFRICAN AMERICAN: 70 mL/min — AB (ref >90)
Glucose, Bld: 227 mg/dL — ABNORMAL HIGH (ref 70–99)
Potassium: 3 mEq/L — ABNORMAL LOW (ref 3.7–5.3)
Sodium: 137 mEq/L (ref 137–147)

## 2013-08-01 LAB — URINALYSIS, ROUTINE W REFLEX MICROSCOPIC
Bilirubin Urine: NEGATIVE
Hgb urine dipstick: NEGATIVE
KETONES UR: NEGATIVE mg/dL
LEUKOCYTES UA: NEGATIVE
Nitrite: NEGATIVE
PROTEIN: NEGATIVE mg/dL
Specific Gravity, Urine: 1.025 (ref 1.005–1.030)
Urobilinogen, UA: 0.2 mg/dL (ref 0.0–1.0)
pH: 5.5 (ref 5.0–8.0)

## 2013-08-01 LAB — CBC WITH DIFFERENTIAL/PLATELET
BASOS ABS: 0 10*3/uL (ref 0.0–0.1)
Basophils Relative: 0 % (ref 0–1)
Eosinophils Absolute: 0 10*3/uL (ref 0.0–0.7)
Eosinophils Relative: 0 % (ref 0–5)
HCT: 39.1 % (ref 36.0–46.0)
Hemoglobin: 13.4 g/dL (ref 12.0–15.0)
Lymphocytes Relative: 6 % — ABNORMAL LOW (ref 12–46)
Lymphs Abs: 0.6 10*3/uL — ABNORMAL LOW (ref 0.7–4.0)
MCH: 31.4 pg (ref 26.0–34.0)
MCHC: 34.3 g/dL (ref 30.0–36.0)
MCV: 91.6 fL (ref 78.0–100.0)
MONO ABS: 0.8 10*3/uL (ref 0.1–1.0)
Monocytes Relative: 8 % (ref 3–12)
Neutro Abs: 9.1 10*3/uL — ABNORMAL HIGH (ref 1.7–7.7)
Neutrophils Relative %: 86 % — ABNORMAL HIGH (ref 43–77)
Platelets: 150 10*3/uL (ref 150–400)
RBC: 4.27 MIL/uL (ref 3.87–5.11)
RDW: 15.1 % (ref 11.5–15.5)
WBC: 10.5 10*3/uL (ref 4.0–10.5)

## 2013-08-01 LAB — PROTIME-INR
INR: 2.53 — ABNORMAL HIGH (ref 0.00–1.49)
Prothrombin Time: 26.4 seconds — ABNORMAL HIGH (ref 11.6–15.2)

## 2013-08-01 LAB — URINE MICROSCOPIC-ADD ON

## 2013-08-01 LAB — LACTIC ACID, PLASMA: Lactic Acid, Venous: 2.6 mmol/L — ABNORMAL HIGH (ref 0.5–2.2)

## 2013-08-01 MED ORDER — POTASSIUM CHLORIDE 20 MEQ/15ML (10%) PO LIQD
40.0000 meq | Freq: Once | ORAL | Status: DC
  Filled 2013-08-01: qty 30

## 2013-08-01 MED ORDER — IBUPROFEN 400 MG PO TABS
400.0000 mg | ORAL_TABLET | Freq: Once | ORAL | Status: AC
  Administered 2013-08-01: 400 mg via ORAL
  Filled 2013-08-01: qty 2

## 2013-08-01 MED ORDER — SODIUM CHLORIDE 0.9 % IV BOLUS (SEPSIS)
500.0000 mL | Freq: Once | INTRAVENOUS | Status: AC
  Administered 2013-08-01: 500 mL via INTRAVENOUS

## 2013-08-01 NOTE — ED Notes (Signed)
Per ems pt from home. Around 4pm this afternoon pt started having chills, and felt hot took some tylenol. Call PCP around 8pm, was told to call 911. Concern for either flu, or UTI, having a small amount of burning, odor, and frequency with urination. 102 Temp by EMS.

## 2013-08-01 NOTE — ED Notes (Signed)
Pt took tylenol at 2000 today.

## 2013-08-01 NOTE — ED Provider Notes (Signed)
CSN: [id number]     Arrival date & time 08/01/13  2112 History   First MD Initiated Contact with Patient 08/01/13 2137     Chief Complaint  Patient presents with  . Fever    HPI: [name] is a 53 yo F with history of OSA requiring trach, morbid obesity, DM, CHF and HLD, who presents via EMS for evaluation of chills, fever and urinary symptoms. She was in her normal state of health until 4 pm when she developed chills and fever to 102. Further endorses diffuse myalgias and fatigue. She also endorses urinary frequency and malodor for several days. Does endorse non-productive cough, no increase in her respiratory secretions or change in color. No nausea, vomiting, diarrhea, abdominal pain, skin redness, joint pain, chest pain or SOB. No recent travel or sick contacts. She is not currently taking any antibiotics.    Past Medical History  Diagnosis Date  . OSA (obstructive sleep apnea)   . Morbid obesity   . Depressive disorder, not elsewhere classified   . Diabetes mellitus   . Asthma   . Dyslipidemia   . Clostridium difficile enterocolitis 2012  . Splenomegaly 06/01/2011  . Thrombocytopenia 06/01/2011  . Hepatosplenomegaly   . Gout   . GERD (gastroesophageal reflux disease)   . Osteoarthritis   . Polyneuropathy   . Urinary incontinence   . Amenorrhea   . CHF (congestive heart failure)   . Hyperlipidemia   . Leg swelling   . Nausea   . Weakness   . Palpitations   . PONV (postoperative nausea and vomiting)   . Anginal pain     no chest pain in years  . Dysrhythmia     heart palpations  . Anxiety   . Pneumonia   . Shortness of breath   . Chronic kidney disease     overactive bladder  . Peripheral neuropathy   . DVT (deep venous thrombosis)    Past Surgical History  Procedure Laterality Date  . Cholecystectomy    . Tracheostomy  2001  . Endometrial ablation    . Skin graft    . Finger surgery      ring finger on right hand  . Esophageal dilation  2003  . Breast biopsy       needle core, left  . Dilation and curettage of uterus      times 2  . Tracheal dilitation  01/24/2012    Procedure: TRACHEAL DILITATION;  Surgeon: Melissa Montane, MD;  Location: [facility] OR;  Service: ENT;  Laterality: N/A;  Trache change with possible dilation, from size 6 to size 7 uncuffed   Family History  Problem Relation Age of Onset  . Diabetes Father   . Hypertension Father   . Dementia Father     Deceased, 3  . Pneumonia Father   . Heart disease Mother   . Clotting disorder Mother   . Hypertension Mother   . Heart attack Mother     Deceased, 40  . Multiple myeloma Paternal Grandmother   . Cancer Paternal Grandmother     multiple myoloma  . Heart disease Paternal Grandfather   . Kidney disease Maternal Grandmother   . Hypertension Sister   . Cancer Paternal Aunt     breast  . Cancer Cousin     breast - all three   History  Substance Use Topics  . Smoking status: Never Smoker   . Smokeless tobacco: Never Used  . Alcohol Use: No  Comment: occasional - once or twice a year   OB History   Grav Para Term Preterm Abortions TAB SAB Ect Mult Living   0              Review of Systems  Constitutional: Positive for fever, chills and fatigue. Negative for appetite change.  Eyes: Negative for photophobia and visual disturbance.  Respiratory: Positive for cough. Negative for shortness of breath.   Cardiovascular: Negative for chest pain and leg swelling.  Gastrointestinal: Negative for nausea, vomiting, abdominal pain, diarrhea and constipation.  Genitourinary: Positive for dysuria and frequency. Negative for decreased urine volume.  Musculoskeletal: Positive for myalgias. Negative for arthralgias, back pain and gait problem.  Skin: Negative for color change and wound.  Neurological: Negative for dizziness, syncope, light-headedness and headaches.  Psychiatric/Behavioral: Negative for confusion and agitation.  All other systems reviewed and are negative.    Allergies   Molds & smuts; Nitroglycerin er; Accolate; Allopurinol; Ciprofloxacin; Codeine; Doxycycline; Fexofenadine; Morphine; Sulfa drugs cross reactors; and Sulfonamide derivatives  Home Medications   Current Outpatient Rx  Name  Route  Sig  Dispense  Refill  . acetaminophen (TYLENOL) 500 MG tablet   Oral   Take 1,000 mg by mouth every 6 (six) hours as needed. For pain         . albuterol (PROVENTIL) (2.5 MG/3ML) 0.083% nebulizer solution   Nebulization   Take 2.5 mg by nebulization every 4 (four) hours as needed. For shortness of breath         . ALPRAZolam (XANAX) 0.5 MG tablet   Oral   Take 0.5 mg by mouth 2 (two) times daily as needed. For anxiety         . atorvastatin (LIPITOR) 10 MG tablet   Oral   Take 10 mg by mouth at bedtime.          . B Complex Vitamins (VITAMIN B COMPLEX PO)   Oral   Take 1 tablet by mouth daily.          . budesonide (PULMICORT) 0.25 MG/2ML nebulizer solution   Nebulization   Take 2 mLs (0.25 mg total) by nebulization 2 (two) times daily. For shortness of breath.   60 mL   12   . bumetanide (BUMEX) 2 MG tablet   Oral   Take 2 mg by mouth daily.          . busPIRone (BUSPAR) 5 MG tablet   Oral   Take 7.5 mg by mouth 2 (two) times daily.          [name] BYSTOLIC 10 MG tablet      TAKE 1 TABLET (10 MG TOTAL) BY MOUTH DAILY. <PLEASE SCHEDULE APPOINTMENT>   15 tablet   0     PATIENT NEEDS AN APPOINTMENT FOR FUTURE REFILLS.   . Canagliflozin (INVOKANA) 100 MG TABS   Oral   Take 100 mg by mouth daily.          . cholestyramine (QUESTRAN) 4 G packet   Oral   Take 1 packet by mouth as needed. For loose stools, IBS.         . clotrimazole (LOTRIMIN) 1 % cream   Topical   Apply 1 application topically daily. To stomach         . colchicine 0.6 MG tablet   Oral   Take 0.6 mg by mouth daily.         . DULoxetine (CYMBALTA) 30 MG capsule   Oral  Take 1 capsule (30 mg total) by mouth 2 (two) times daily.   180 capsule    3   . ergocalciferol (VITAMIN D2) 50000 UNITS capsule   Oral   Take 50,000 Units by mouth once a week. Take on Fridays         . Febuxostat (ULORIC) 80 MG TABS   Oral   Take 80 mg by mouth at bedtime.          . fesoterodine (TOVIAZ) 4 MG TB24   Oral   Take 4 mg by mouth daily.           . fish oil-omega-3 fatty acids 1000 MG capsule   Oral   Take 1 g by mouth 2 (two) times daily.          [name] gabapentin (NEURONTIN) 100 MG capsule      Take one tablet in the morning, 2 tablets in the afternoon, and 2 tablets at bedtime   480 capsule   3     PATIENT NEEDS TO SCHEDULE APPT   . guaiFENesin (MUCINEX) 600 MG 12 hr tablet   Oral   Take 600 mg by mouth 2 (two) times daily.          [name] HYDROcodone-acetaminophen (NORCO/VICODIN) 5-325 MG per tablet   Oral   Take 1 tablet by mouth every 6 (six) hours as needed for pain.         [name] insulin regular human CONCENTRATED (HUMULIN R) 500 UNIT/ML SOLN injection   Subcutaneous   Inject 28 Units into the skin 2 (two) times daily before a meal.          . ipratropium (ATROVENT) 0.02 % nebulizer solution   Nebulization   Take 2.5 mLs (500 mcg total) by nebulization 4 (four) times daily.   75 mL   12   . Liraglutide (VICTOZA) 18 MG/3ML SOLN injection   Subcutaneous   Inject 1.8 mg into the skin daily.         [name] loratadine (CLARITIN) 10 MG tablet   Oral   Take 10 mg by mouth daily as needed. For allergies         . losartan (COZAAR) 100 MG tablet      Take 1 tablet (178m) by mouth daily. <please schedule appointment>   30 tablet   0   . meclizine (ANTIVERT) 25 MG tablet   Oral   Take 25 mg by mouth at bedtime.          . metolazone (ZAROXOLYN) 5 MG tablet   Oral   Take 5 mg by mouth See admin instructions. 3 times weekly as needed for edema         . Multiple Vitamin (MULTIVITAMIN) capsule   Oral   Take 1 capsule by mouth daily.           .Marland Kitchenomeprazole (PRILOSEC) 40 MG capsule   Oral   Take 40 mg by  mouth 2 (two) times daily.          . potassium chloride SA (K-DUR,KLOR-CON) 20 MEQ tablet   Oral   Take 40 mEq by mouth 3 (three) times daily.          . primidone (MYSOLINE) 50 MG tablet      Take one tablet twice daily   240 tablet   3   . Probiotic Product (PROBIOTIC PO)   Oral   Take 1 tablet by mouth 2 (two) times daily.         .[name]  promethazine (PHENERGAN) 25 MG tablet   Oral   Take 25 mg by mouth every 6 (six) hours as needed. For nausea/vomiting         . pyridOXINE (VITAMIN B-6) 100 MG tablet   Oral   Take 100 mg by mouth daily.         [name] topiramate (TOPAMAX) 50 MG tablet   Oral   Take 1 tablet (50 mg total) by mouth daily.   90 tablet   3   . warfarin (COUMADIN) 5 MG tablet   Oral   Take 5-7.5 mg by mouth daily. Take 1 tablet (20m) on Monday, Tuesday, Thursday, Friday.   Take 1.5 tablet (7.580m on Sunday, Wednesday, and saturday          BP 130/64  Pulse 91  Temp(Src) 100.6 F (38.1 C) (Oral)  Resp 18  SpO2 96% Physical Exam  Nursing note and vitals reviewed. Constitutional: She is oriented to person, place, and time. No distress.  Morbidly obese female, sitting up in bed, well appearing, no distress.   HENT:  Head: Normocephalic and atraumatic.  Mouth/Throat: Oropharynx is clear and moist and mucous membranes are normal.  Eyes: Conjunctivae and EOM are normal. Pupils are equal, round, and reactive to light.  Neck: Normal range of motion. Neck supple.  Trach in place  Cardiovascular: Normal rate, regular rhythm, normal heart sounds and intact distal pulses.   Pulmonary/Chest: Effort normal and breath sounds normal. No respiratory distress.  Abdominal: Soft. Bowel sounds are normal. There is no tenderness. There is no rebound and no guarding.  Musculoskeletal: Normal range of motion. She exhibits no edema and no tenderness.  Stasis dermatitis to both legs, no significant swelling  Neurological: She is alert and oriented to person, place,  and time. No cranial nerve deficit. Coordination normal.  Skin: Skin is warm and dry. No rash noted.  Psychiatric: She has a normal mood and affect. Her behavior is normal.    ED Course  Procedures (including critical care time) Labs Review Labs Reviewed  CBC WITH DIFFERENTIAL - Abnormal; Notable for the following:    Neutrophils Relative % 86 (*)    Neutro Abs 9.1 (*)    Lymphocytes Relative 6 (*)    Lymphs Abs 0.6 (*)    All other components within normal limits  BASIC METABOLIC PANEL - Abnormal; Notable for the following:    Potassium 3.0 (*)    Chloride 95 (*)    Glucose, Bld 227 (*)    GFR calc non Af Amer 60 (*)    GFR calc Af Amer 70 (*)    All other components within normal limits  URINALYSIS, ROUTINE W REFLEX MICROSCOPIC - Abnormal; Notable for the following:    Glucose, UA >1000 (*)    All other components within normal limits  LACTIC ACID, PLASMA - Abnormal; Notable for the following:    Lactic Acid, Venous 2.6 (*)    All other components within normal limits  PROTIME-INR - Abnormal; Notable for the following:    Prothrombin Time 26.4 (*)    INR 2.53 (*)    All other components within normal limits  CULTURE, BLOOD (ROUTINE X 2)  CULTURE, BLOOD (ROUTINE X 2)  URINE MICROSCOPIC-ADD ON  INFLUENZA PANEL BY PCR (TYPE A & B, H1N1)   Imaging Review Dg Chest Portable 1 View  08/01/2013   CLINICAL DATA:  [age]ear old female with fever. Patient with tracheostomy.  EXAM: PORTABLE CHEST - 1 VIEW  COMPARISON:  04/22/2013  and prior chest radiographs dating back to 11/13/2006  FINDINGS: Upper limits normal heart size noted. Peribronchial thickening is unchanged.  A tracheostomy tube is again identified.  Mild elevation of the right hemidiaphragm is stable.  There is no evidence of focal airspace disease, pulmonary edema, suspicious pulmonary nodule/mass, pleural effusion, or pneumothorax. No acute bony abnormalities are identified.  IMPRESSION: No evidence of acute cardiopulmonary  disease.  Chronic peribronchial thickening.   Electronically Signed   By: Hassan Rowan [name]   On: 08/01/2013 22:16    EKG Interpretation    Date/Time:  Saturday August 01 2013 21:20:34 EST Ventricular Rate:  88 PR Interval:  171 QRS Duration: 109 QT Interval:  401 QTC Calculation: 485 R Axis:   6 Text Interpretation:  Age not entered, assumed to be  53 years old for purpose of ECG interpretation Sinus rhythm Low voltage, precordial leads Consider anterior infarct No significant change since last tracing Confirmed by DOCHERTY  MD, MEGAN [phone]) on 08/01/2013 10:15:49 PM            MDM   53 yo F with history of OSA requiring trach, morbid obesity, DM, CHF and HLD, who presents via EMS for evaluation of chills, fever and urinary symptoms. Febrile to 102 by EMS. On arrival she is well appearing, no respiratory distress. Doubt ACS as she has no chest pain, ECG without acute ischemic changes. Low suspicion for PNA as he cough is non-productive, no increase in trach secretions, lungs clear, not hypoxic, CXR without consolidation. UA without evidence of infection. Labs show K of 3.0, replaced orally, other lytes and cr normal. Glucose 227, CO2 25. Lactic acid mildly elevated to 2.6, treated with 500 cc bolus NS. Low suspicion for PE given lack of chest pain, SOB and INR of 2.5. Constellation of symptoms c/w influenza or other viral process. Will treat empirically with tamiflu given pulmonary history. Advised continued fluid intake and Tylenol for myalgias. Advised her to f/u with her PCP in two days. Strict return precautions given. She was in agreement with plan and voiced understanding.   Reviewed imaging, labs, previous medical records and ECG, utilized in MDM   Discussed case with Dr. Tawnya Crook  Clinical Impression 1. Viral illness 2. Hypokalemia    Louretta Shorten, MD 08/02/13 [phone]

## 2013-08-02 DIAGNOSIS — B9789 Other viral agents as the cause of diseases classified elsewhere: Secondary | ICD-10-CM | POA: Diagnosis not present

## 2013-08-02 LAB — INFLUENZA PANEL BY PCR (TYPE A & B)
H1N1FLUPCR: NOT DETECTED
Influenza A By PCR: NEGATIVE
Influenza B By PCR: NEGATIVE

## 2013-08-02 MED ORDER — OSELTAMIVIR PHOSPHATE 75 MG PO CAPS: 75.0000 mg | ORAL_CAPSULE | Freq: Two times a day (BID) | ORAL | Status: DC

## 2013-08-02 MED ORDER — OSELTAMIVIR PHOSPHATE 75 MG PO CAPS
75.0000 mg | ORAL_CAPSULE | Freq: Once | ORAL | Status: AC
  Administered 2013-08-02: 75 mg via ORAL
  Filled 2013-08-02: qty 1

## 2013-08-02 MED ORDER — POTASSIUM CHLORIDE CRYS ER 20 MEQ PO TBCR
40.0000 meq | EXTENDED_RELEASE_TABLET | Freq: Once | ORAL | Status: AC
  Administered 2013-08-02: 40 meq via ORAL
  Filled 2013-08-02: qty 2

## 2013-08-02 NOTE — ED Provider Notes (Signed)
Medical screening examination/treatment/procedure(s) were conducted as a shared visit with resident-physician practitioner(s) and myself.  I personally evaluated the patient during the encounter.  Pt is a 53 y.o. female with pmhx as above presenting with fever, chills, malaise.  Pt found to have no acute CXR findings, clean urine. Lactate mildly elevated.  Pt treated w/ NS bolus.  Suspect flu-like illness.  As pt HDS, non-toxic appearing, I feel she is safe for d/c home with Rx for Tamiflu.     Neta Ehlers, MD 08/02/13 1139

## 2013-08-02 NOTE — ED Notes (Signed)
Spoke with patient, will have caregiver arrive at 0800 this morning, states she does not have 24 hour care and does not need it. Patient states it is ok to go home alone at this time. Dr. Zenia Resides aware, ok with discharging pt.

## 2013-08-02 NOTE — ED Notes (Signed)
PTAR notified.  ?

## 2013-08-02 NOTE — Discharge Instructions (Signed)
Take Tamiflu as prescribed. Your chest x-ray was negative for pneumonia. Please see your doctor on Monday. If you have worsening symptoms, return to the ED.   Influenza A (H1N1) H1N1 formerly called "swine flu" is a new influenza virus causing sickness in people. The H1N1 virus is different from seasonal influenza viruses. However, the H1N1 symptoms are similar to seasonal influenza and it is spread from person to person. You may be at higher risk for serious problems if you have underlying serious medical conditions. The CDC and the [facility] are following reported cases around the world. CAUSES   The flu is thought to spread mainly person-to-person through coughing or sneezing of infected people.  A person may become infected by touching something with the virus on it and then touching their mouth or nose. SYMPTOMS   Fever.  Headache.  Tiredness.  Cough.  Sore throat.  Runny or stuffy nose.  Body aches.  Diarrhea and vomiting These symptoms are referred to as "flu-like symptoms." A lot of different illnesses, including the common cold, may have similar symptoms. DIAGNOSIS   There are tests that can tell if you have the H1N1 virus.  Confirmed cases of H1N1 will be reported to the state or local health department.  A doctor's exam may be needed to tell whether you have an infection that is a complication of the flu. HOME CARE INSTRUCTIONS   Stay informed. Visit the [facility] website for current recommendations. Visit [employer]. You may also call 1-800-CDC-INFO (713) 457-0667).  Get help early if you develop any of the above symptoms.  If you are at high risk from complications of the flu, talk to your caregiver as soon as you develop flu-like symptoms. Those at higher risk for complications include:  People 65 years or older.  People with chronic medical conditions.  Pregnant women.  Young children.  Your caregiver may recommend antiviral  medicine to help treat the flu.  If you get the flu, get plenty of rest, drink enough water and fluids to keep your urine clear or pale yellow, and avoid using alcohol or tobacco.  You may take over-the-counter medicine to relieve the symptoms of the flu if your caregiver approves. (Never give aspirin to children or teenagers who have flu-like symptoms, particularly fever). TREATMENT  If you do get sick, antiviral drugs are available. These drugs can make your illness milder and make you feel better faster. Treatment should start soon after illness starts. It is only effective if taken within the first day of becoming ill. Only your caregiver can prescribe antiviral medication.  PREVENTION   Cover your nose and mouth with a tissue or your arm when you cough or sneeze. Throw the tissue away.  Wash your hands often with soap and warm water, especially after you cough or sneeze. Alcohol-based cleaners are also effective against germs.  Avoid touching your eyes, nose or mouth. This is one way germs spread.  Try to avoid contact with sick people. Follow public health advice regarding school closures. Avoid crowds.  Stay home if you get sick. Limit contact with others to keep from infecting them. People infected with the H1N1 virus may be able to infect others anywhere from 1 day before feeling sick to 5-7 days after getting flu symptoms.  An H1N1 vaccine is available to help protect against the virus. In addition to the H1N1 vaccine, you will need to be vaccinated for seasonal influenza. The H1N1 and seasonal vaccines may be given on the same  day. The CDC especially recommends the H1N1 vaccine for:  Pregnant women.  People who live with or care for children younger than 27 months of age.  Health care and emergency services personnel.  Persons between the ages of 32 months through 78 years of age.  People from ages 75 through 3 years who are at higher risk for H1N1 because of chronic health  disorders or immune system problems. FACEMASKS In community and home settings, the use of facemasks and N95 respirators are not normally recommended. In certain circumstances, a facemask or N95 respirator may be used for persons at increased risk of severe illness from influenza. Your caregiver can give additional recommendations for facemask use. IN CHILDREN, EMERGENCY WARNING SIGNS THAT NEED URGENT MEDICAL CARE:  Fast breathing or trouble breathing.  Bluish skin color.  Not drinking enough fluids.  Not waking up or not interacting normally.  Being so fussy that the child does not want to be held.  Your child has an oral temperature above 102 F (38.9 C), not controlled by medicine.  Your baby is older than 3 months with a rectal temperature of 102 F (38.9 C) or higher.  Your baby is 60 months old or younger with a rectal temperature of 100.4 F (38 C) or higher.  Flu-like symptoms improve but then return with fever and worse cough. IN ADULTS, EMERGENCY WARNING SIGNS THAT NEED URGENT MEDICAL CARE:  Difficulty breathing or shortness of breath.  Pain or pressure in the chest or abdomen.  Sudden dizziness.  Confusion.  Severe or persistent vomiting.  Bluish color.  You have a oral temperature above 102 F (38.9 C), not controlled by medicine.  Flu-like symptoms improve but return with fever and worse cough. SEEK IMMEDIATE MEDICAL CARE IF:  You or someone you know is experiencing any of the above symptoms. When you arrive at the emergency center, report that you think you have the flu. You may be asked to wear a mask and/or sit in a secluded area to protect others from getting sick. MAKE SURE YOU:   Understand these instructions.  Will watch your condition.  Will get help right away if you are not doing well or get worse. Some of this information courtesy of the CDC.  Document Released: 11/28/2007 Document Revised: 09/03/2011 Document Reviewed: 11/28/2007 [facility]  Patient Information 2014 [place], [place].

## 2013-08-05 ENCOUNTER — Other Ambulatory Visit: Payer: Self-pay | Admitting: *Deleted

## 2013-08-05 ENCOUNTER — Other Ambulatory Visit: Payer: Self-pay | Admitting: Cardiovascular Disease

## 2013-08-05 MED ORDER — NEBIVOLOL HCL 10 MG PO TABS: ORAL_TABLET | ORAL | Status: DC

## 2013-08-05 MED ORDER — METOLAZONE 5 MG PO TABS: 5.0000 mg | ORAL_TABLET | ORAL | Status: DC

## 2013-08-05 MED ORDER — LOSARTAN POTASSIUM 100 MG PO TABS: ORAL_TABLET | ORAL | Status: DC

## 2013-08-08 LAB — CULTURE, BLOOD (ROUTINE X 2)
CULTURE: NO GROWTH
Culture: NO GROWTH

## 2013-08-21 ENCOUNTER — Ambulatory Visit: Payer: Medicare Other | Admitting: Neurology

## 2013-09-08 ENCOUNTER — Ambulatory Visit: Payer: Medicare Other | Admitting: Neurology

## 2013-09-29 ENCOUNTER — Ambulatory Visit: Payer: Medicare Other | Admitting: Neurology

## 2013-10-02 ENCOUNTER — Other Ambulatory Visit: Payer: Self-pay

## 2013-10-02 MED ORDER — LOSARTAN POTASSIUM 100 MG PO TABS: ORAL_TABLET | ORAL | Status: DC

## 2013-10-02 MED ORDER — NEBIVOLOL HCL 10 MG PO TABS: ORAL_TABLET | ORAL | Status: DC

## 2013-10-02 MED ORDER — BUMETANIDE 2 MG PO TABS: 2.0000 mg | ORAL_TABLET | Freq: Every day | ORAL | Status: DC

## 2013-11-04 ENCOUNTER — Other Ambulatory Visit: Payer: Self-pay | Admitting: Obstetrics and Gynecology

## 2013-11-04 DIAGNOSIS — R109 Unspecified abdominal pain: Secondary | ICD-10-CM

## 2013-11-10 ENCOUNTER — Other Ambulatory Visit: Payer: Medicare Other

## 2013-11-12 ENCOUNTER — Ambulatory Visit
Admission: RE | Admit: 2013-11-12 | Discharge: 2013-11-12 | Disposition: A | Discharge status: Home / Self Care | Payer: Medicare Other | Source: Ambulatory Visit | Attending: Obstetrics and Gynecology | Admitting: Obstetrics and Gynecology

## 2013-11-12 DIAGNOSIS — R109 Unspecified abdominal pain: Secondary | ICD-10-CM

## 2013-11-12 MED ORDER — IOHEXOL 300 MG/ML  SOLN
125.0000 mL | Freq: Once | INTRAMUSCULAR | Status: AC | PRN
  Administered 2013-11-12: 125 mL via INTRAVENOUS

## 2013-11-19 ENCOUNTER — Encounter (HOSPITAL_BASED_OUTPATIENT_CLINIC_OR_DEPARTMENT_OTHER): Payer: Self-pay | Admitting: Certified Registered"

## 2013-11-19 ENCOUNTER — Encounter (HOSPITAL_BASED_OUTPATIENT_CLINIC_OR_DEPARTMENT_OTHER): Payer: Medicare Other | Admitting: Certified Registered"

## 2013-11-19 ENCOUNTER — Encounter (HOSPITAL_BASED_OUTPATIENT_CLINIC_OR_DEPARTMENT_OTHER)
Admission: RE | Disposition: A | Discharge status: Home / Self Care | Payer: Self-pay | Source: Ambulatory Visit | Attending: Orthopedic Surgery

## 2013-11-19 ENCOUNTER — Other Ambulatory Visit: Payer: Self-pay | Admitting: Orthopedic Surgery

## 2013-11-19 ENCOUNTER — Ambulatory Visit (HOSPITAL_BASED_OUTPATIENT_CLINIC_OR_DEPARTMENT_OTHER): Payer: Medicare Other | Admitting: Certified Registered"

## 2013-11-19 ENCOUNTER — Ambulatory Visit (HOSPITAL_BASED_OUTPATIENT_CLINIC_OR_DEPARTMENT_OTHER)
Admission: RE | Admit: 2013-11-19 | Discharge: 2013-11-19 | Disposition: A | Discharge status: Home / Self Care | Payer: Medicare Other | Source: Ambulatory Visit | Attending: Orthopedic Surgery | Admitting: Orthopedic Surgery

## 2013-11-19 DIAGNOSIS — I509 Heart failure, unspecified: Secondary | ICD-10-CM | POA: Diagnosis not present

## 2013-11-19 DIAGNOSIS — I499 Cardiac arrhythmia, unspecified: Secondary | ICD-10-CM | POA: Insufficient documentation

## 2013-11-19 DIAGNOSIS — F329 Major depressive disorder, single episode, unspecified: Secondary | ICD-10-CM | POA: Diagnosis not present

## 2013-11-19 DIAGNOSIS — Z9911 Dependence on respirator [ventilator] status: Secondary | ICD-10-CM | POA: Insufficient documentation

## 2013-11-19 DIAGNOSIS — K219 Gastro-esophageal reflux disease without esophagitis: Secondary | ICD-10-CM | POA: Insufficient documentation

## 2013-11-19 DIAGNOSIS — E669 Obesity, unspecified: Secondary | ICD-10-CM | POA: Diagnosis not present

## 2013-11-19 DIAGNOSIS — F411 Generalized anxiety disorder: Secondary | ICD-10-CM | POA: Insufficient documentation

## 2013-11-19 DIAGNOSIS — G4730 Sleep apnea, unspecified: Secondary | ICD-10-CM | POA: Diagnosis not present

## 2013-11-19 DIAGNOSIS — Z6841 Body Mass Index (BMI) 40.0 and over, adult: Secondary | ICD-10-CM | POA: Insufficient documentation

## 2013-11-19 DIAGNOSIS — F3289 Other specified depressive episodes: Secondary | ICD-10-CM | POA: Diagnosis not present

## 2013-11-19 DIAGNOSIS — I96 Gangrene, not elsewhere classified: Secondary | ICD-10-CM | POA: Insufficient documentation

## 2013-11-19 DIAGNOSIS — J45909 Unspecified asthma, uncomplicated: Secondary | ICD-10-CM | POA: Diagnosis not present

## 2013-11-19 DIAGNOSIS — E785 Hyperlipidemia, unspecified: Secondary | ICD-10-CM | POA: Diagnosis not present

## 2013-11-19 DIAGNOSIS — M86649 Other chronic osteomyelitis, unspecified hand: Secondary | ICD-10-CM | POA: Insufficient documentation

## 2013-11-19 DIAGNOSIS — E109 Type 1 diabetes mellitus without complications: Secondary | ICD-10-CM | POA: Insufficient documentation

## 2013-11-19 DIAGNOSIS — IMO0002 Reserved for concepts with insufficient information to code with codable children: Secondary | ICD-10-CM | POA: Diagnosis not present

## 2013-11-19 DIAGNOSIS — M109 Gout, unspecified: Secondary | ICD-10-CM | POA: Insufficient documentation

## 2013-11-19 DIAGNOSIS — Z930 Tracheostomy status: Secondary | ICD-10-CM | POA: Insufficient documentation

## 2013-11-19 DIAGNOSIS — N189 Chronic kidney disease, unspecified: Secondary | ICD-10-CM | POA: Insufficient documentation

## 2013-11-19 DIAGNOSIS — I129 Hypertensive chronic kidney disease with stage 1 through stage 4 chronic kidney disease, or unspecified chronic kidney disease: Secondary | ICD-10-CM | POA: Diagnosis not present

## 2013-11-19 DIAGNOSIS — E662 Morbid (severe) obesity with alveolar hypoventilation: Secondary | ICD-10-CM | POA: Diagnosis not present

## 2013-11-19 DIAGNOSIS — Z794 Long term (current) use of insulin: Secondary | ICD-10-CM | POA: Diagnosis not present

## 2013-11-19 DIAGNOSIS — G609 Hereditary and idiopathic neuropathy, unspecified: Secondary | ICD-10-CM | POA: Diagnosis not present

## 2013-11-19 HISTORY — PX: AMPUTATION: SHX166

## 2013-11-19 LAB — GLUCOSE, CAPILLARY
GLUCOSE-CAPILLARY: 166 mg/dL — AB (ref 70–99)
Glucose-Capillary: 168 mg/dL — ABNORMAL HIGH (ref 70–99)

## 2013-11-19 SURGERY — AMPUTATION DIGIT
Anesthesia: Monitor Anesthesia Care | Site: Finger | Laterality: Right

## 2013-11-19 MED ORDER — LIDOCAINE HCL 2 % IJ SOLN
INTRAMUSCULAR | Status: DC | PRN
  Administered 2013-11-19: 10 mL

## 2013-11-19 MED ORDER — FENTANYL CITRATE 0.05 MG/ML IJ SOLN
INTRAMUSCULAR | Status: AC
  Filled 2013-11-19: qty 4

## 2013-11-19 MED ORDER — LACTATED RINGERS IV SOLN
INTRAVENOUS | Status: DC
  Administered 2013-11-19: 15:00:00 via INTRAVENOUS

## 2013-11-19 MED ORDER — LACTATED RINGERS IV SOLN: INTRAVENOUS | Status: DC

## 2013-11-19 MED ORDER — PROPOFOL 10 MG/ML IV BOLUS
INTRAVENOUS | Status: AC
  Filled 2013-11-19: qty 20

## 2013-11-19 MED ORDER — MIDAZOLAM HCL 2 MG/2ML IJ SOLN
INTRAMUSCULAR | Status: AC
  Filled 2013-11-19: qty 2

## 2013-11-19 MED ORDER — LIDOCAINE HCL 2 % IJ SOLN
INTRAMUSCULAR | Status: AC
  Filled 2013-11-19: qty 20

## 2013-11-19 MED ORDER — AMOXICILLIN-POT CLAVULANATE 875-125 MG PO TABS: 1.0000 | ORAL_TABLET | Freq: Two times a day (BID) | ORAL | Status: DC

## 2013-11-19 SURGICAL SUPPLY — 51 items
BANDAGE ADH SHEER 1  50/CT (GAUZE/BANDAGES/DRESSINGS) IMPLANT
BANDAGE ELASTIC 3 VELCRO ST LF (GAUZE/BANDAGES/DRESSINGS) ×3 IMPLANT
BLADE 15 SAFETY STRL DISP (BLADE) IMPLANT
BLADE MINI RND TIP GREEN BEAV (BLADE) IMPLANT
BLADE SURG 15 STRL LF DISP TIS (BLADE) ×2 IMPLANT
BLADE SURG 15 STRL SS (BLADE) ×4
BNDG COHESIVE 3X5 TAN STRL LF (GAUZE/BANDAGES/DRESSINGS) IMPLANT
BNDG CONFORM 3 STRL LF (GAUZE/BANDAGES/DRESSINGS) IMPLANT
BNDG ESMARK 4X9 LF (GAUZE/BANDAGES/DRESSINGS) ×3 IMPLANT
BNDG GAUZE ELAST 4 BULKY (GAUZE/BANDAGES/DRESSINGS) IMPLANT
BRUSH SCRUB EZ PLAIN DRY (MISCELLANEOUS) ×3 IMPLANT
CLOSURE WOUND 1/2 X4 (GAUZE/BANDAGES/DRESSINGS)
CORDS BIPOLAR (ELECTRODE) ×3 IMPLANT
COVER MAYO STAND STRL (DRAPES) ×3 IMPLANT
COVER TABLE BACK 60X90 (DRAPES) ×3 IMPLANT
CUFF TOURNIQUET SINGLE 18IN (TOURNIQUET CUFF) IMPLANT
DECANTER SPIKE VIAL GLASS SM (MISCELLANEOUS) ×3 IMPLANT
DRAIN PENROSE 1/2X12 LTX STRL (WOUND CARE) ×3 IMPLANT
DRAPE EXTREMITY T 121X128X90 (DRAPE) ×3 IMPLANT
DRAPE SURG 17X23 STRL (DRAPES) ×3 IMPLANT
GAUZE SPONGE 4X4 12PLY STRL (GAUZE/BANDAGES/DRESSINGS) ×3 IMPLANT
GAUZE XEROFORM 1X8 LF (GAUZE/BANDAGES/DRESSINGS) ×3 IMPLANT
GLOVE BIOGEL M STRL SZ7.5 (GLOVE) IMPLANT
GLOVE ORTHO TXT STRL SZ7.5 (GLOVE) ×3 IMPLANT
GOWN STRL REUS W/ TWL LRG LVL3 (GOWN DISPOSABLE) IMPLANT
GOWN STRL REUS W/ TWL XL LVL3 (GOWN DISPOSABLE) ×2 IMPLANT
GOWN STRL REUS W/TWL LRG LVL3 (GOWN DISPOSABLE)
GOWN STRL REUS W/TWL XL LVL3 (GOWN DISPOSABLE) ×4
LOOP VESSEL MAXI BLUE (MISCELLANEOUS) IMPLANT
NEEDLE 27GAX1X1/2 (NEEDLE) ×6 IMPLANT
NEEDLE HYPO 25X1 1.5 SAFETY (NEEDLE) IMPLANT
NS IRRIG 1000ML POUR BTL (IV SOLUTION) ×3 IMPLANT
PACK BASIN DAY SURGERY FS (CUSTOM PROCEDURE TRAY) ×3 IMPLANT
PAD CAST 3X4 CTTN HI CHSV (CAST SUPPLIES) ×1 IMPLANT
PADDING CAST ABS 4INX4YD NS (CAST SUPPLIES)
PADDING CAST ABS COTTON 4X4 ST (CAST SUPPLIES) IMPLANT
PADDING CAST COTTON 3X4 STRL (CAST SUPPLIES) ×2
SPLINT PLASTER CAST XFAST 3X15 (CAST SUPPLIES) IMPLANT
SPLINT PLASTER XTRA FASTSET 3X (CAST SUPPLIES)
STOCKINETTE 4X48 STRL (DRAPES) ×3 IMPLANT
STRIP CLOSURE SKIN 1/2X4 (GAUZE/BANDAGES/DRESSINGS) IMPLANT
SUT ETHILON 4 0 P 3 18 (SUTURE) ×3 IMPLANT
SUT PROLENE 3 0 PS 2 (SUTURE) IMPLANT
SUT VIC AB 4-0 P2 18 (SUTURE) IMPLANT
SYR 3ML 23GX1 SAFETY (SYRINGE) IMPLANT
SYR BULB 3OZ (MISCELLANEOUS) IMPLANT
SYR CONTROL 10ML LL (SYRINGE) ×6 IMPLANT
TOWEL OR 17X24 6PK STRL BLUE (TOWEL DISPOSABLE) ×6 IMPLANT
TRAY DSU PREP LF (CUSTOM PROCEDURE TRAY) ×3 IMPLANT
TUBE ANAEROBIC SPECIMEN COL (MISCELLANEOUS) ×3 IMPLANT
UNDERPAD 30X30 INCONTINENT (UNDERPADS AND DIAPERS) ×3 IMPLANT

## 2013-11-19 NOTE — Brief Op Note (Signed)
11/19/2013  3:45 PM  PATIENT:  Shirlean Schlein  53 y.o. 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  PROCEDURE:  Procedure(s) with comments: AMPUTATION DIGIT (Right) - Partial amputation right long finger, Debridement right ring finger   SURGEON:  Surgeon(s) and Role:    * Cammie Sickle, MD - Primary  PHYSICIAN ASSISTANT:   ASSISTANTS: nurse   ANESTHESIA:   local  EBL:  Total I/O In: 500 [I.V.:500] Out: -   BLOOD ADMINISTERED:none  DRAINS: none   LOCAL MEDICATIONS USED:  XYLOCAINE   SPECIMEN:  Excision  DISPOSITION OF SPECIMEN:  PATHOLOGY  COUNTS:  YES  TOURNIQUET:    DICTATION: .Other Dictation: Dictation Number [id number]  PLAN OF CARE: Discharge to home after PACU  PATIENT DISPOSITION:  PACU - hemodynamically stable.   Delay start of Pharmacological VTE agent (>24hrs) due to surgical blood loss or risk of bleeding: not applicable

## 2013-11-19 NOTE — H&P (Signed)
[name] is an 53 y.o. female.   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 [name] has a history of biting her cuticles and has severe diabetes with neuropathy leading to paronychia and severe infection of her nail folds.  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 The distal phalangeal diaphysis appears to be a sequestrum and 75% of the distal phalanx has eroded due to chronic infection. Has multiple antibiotic intolerances which will complicate treatment however she reports she has been able to tolerate oral Augmentin. We have discussed with her the need to use probiotics and/or yogurt while taking Augmentin to try to keep her gut flora aligned correctly. He does have a history of gout and it is remotely possible that a severe tophus is also involved in this clinical presentation.  Due to the destruction of her distal phalanx either the diagnosis of abscess or gout will indicate the same treatment i.e. shortening of the finger with a DIP joint disarticulation hand closure with fishmouth flaps in a delayed manner.  Past Medical History  Diagnosis Date  . OSA (obstructive sleep apnea)   . Morbid obesity   . Depressive disorder, not elsewhere classified   . Diabetes mellitus   . Asthma   . Dyslipidemia   . Clostridium difficile enterocolitis 2012  . Splenomegaly 06/01/2011  . Thrombocytopenia 06/01/2011  . Hepatosplenomegaly   . Gout   . GERD (gastroesophageal reflux disease)   . Osteoarthritis   . Polyneuropathy   . Urinary incontinence   . Amenorrhea   . CHF (congestive heart failure)   . Hyperlipidemia   . Leg swelling   . Nausea   . Weakness   . Palpitations   . PONV (postoperative nausea and vomiting)   .  Anginal pain     no chest pain in years  . Dysrhythmia     heart palpations  . Anxiety   . Pneumonia   . Shortness of breath   . Chronic kidney disease     overactive bladder  . Peripheral neuropathy   . DVT (deep venous thrombosis)     Past Surgical History  Procedure Laterality Date  . Cholecystectomy    . Tracheostomy  2001  . Endometrial ablation    . Skin graft    . Finger surgery      ring finger on right hand  . Esophageal dilation  2003  . Breast biopsy      needle core, left  . Dilation and curettage of uterus      times 2  . Tracheal dilitation  01/24/2012    Procedure: TRACHEAL DILITATION;  Surgeon: Melissa Montane, MD;  Location: [facility] OR;  Service: ENT;  Laterality: N/A;  Trache change with possible dilation, from size 6 to size 7 uncuffed    Family History  Problem Relation Age of Onset  . Diabetes Father   . Hypertension Father   . Dementia Father     Deceased, 68  . Pneumonia Father   . Heart disease Mother   . Clotting disorder Mother   . Hypertension Mother   . Heart attack Mother     Deceased, 24  . Multiple myeloma Paternal Grandmother   . Cancer Paternal Grandmother  multiple myoloma  . Heart disease Paternal Grandfather   . Kidney disease Maternal Grandmother   . Hypertension Sister   . Cancer Paternal Aunt     breast  . Cancer Cousin     breast - all three   Social History:  reports that she has never smoked. She has never used smokeless tobacco. She reports that she does not drink alcohol or use illicit drugs.  Allergies:  Allergies  Allergen Reactions  . Molds & Smuts Shortness Of Breath and Rash  . Nitroglycerin Er Nausea And Vomiting  . Accolate [Zafirlukast] Other (See Comments)    Reaction unknown  . Allopurinol Other (See Comments)    GI problems   . Ciprofloxacin Other (See Comments)    GI problems  . Codeine Other (See Comments)    Heart problems  . Doxycycline Other (See Comments)    Gastric problems  . Fexofenadine  Other (See Comments)    Hurts joints/pain  . Keflex [Cephalexin] Diarrhea and Nausea Only  . Morphine Other (See Comments)    unknown  . Sulfa Drugs Cross Reactors Nausea And Vomiting    nausea  . Sulfonamide Derivatives Nausea Only    Medications Prior to Admission  Medication Sig Dispense Refill  . acetaminophen (TYLENOL) 500 MG tablet Take 1,000 mg by mouth every 6 (six) hours as needed. For pain      . albuterol (PROVENTIL) (2.5 MG/3ML) 0.083% nebulizer solution Take 2.5 mg by nebulization every 4 (four) hours as needed. For shortness of breath      . ALPRAZolam (XANAX) 0.5 MG tablet Take 0.5 mg by mouth 2 (two) times daily as needed. For anxiety      . atorvastatin (LIPITOR) 10 MG tablet Take 10 mg by mouth at bedtime.       . B Complex Vitamins (VITAMIN B COMPLEX PO) Take 1 tablet by mouth daily.       . budesonide (PULMICORT) 0.25 MG/2ML nebulizer solution Take 2 mLs (0.25 mg total) by nebulization 2 (two) times daily. For shortness of breath.  60 mL  12  . bumetanide (BUMEX) 2 MG tablet Take 1 tablet (2 mg total) by mouth daily.  90 tablet  3  . busPIRone (BUSPAR) 5 MG tablet Take 7.5 mg by mouth 2 (two) times daily.       . Canagliflozin (INVOKANA) 100 MG TABS Take 100 mg by mouth daily.       . cholestyramine (QUESTRAN) 4 G packet Take 1 packet by mouth as needed. For loose stools, IBS.      . clotrimazole (LOTRIMIN) 1 % cream Apply 1 application topically daily. To stomach      . colchicine 0.6 MG tablet Take 0.6 mg by mouth daily.      . DULoxetine (CYMBALTA) 30 MG capsule Take 1 capsule (30 mg total) by mouth 2 (two) times daily.  180 capsule  3  . ergocalciferol (VITAMIN D2) 50000 UNITS capsule Take 50,000 Units by mouth once a week. Take on Fridays      . Febuxostat (ULORIC) 80 MG TABS Take 80 mg by mouth at bedtime.       . fesoterodine (TOVIAZ) 4 MG TB24 Take 4 mg by mouth daily.        . fish oil-omega-3 fatty acids 1000 MG capsule Take 1 g by mouth 2 (two) times daily.        [name] gabapentin (NEURONTIN) 100 MG capsule Take one tablet in the morning, 2 tablets in the  afternoon, and 2 tablets at bedtime  480 capsule  3  . guaiFENesin (MUCINEX) 600 MG 12 hr tablet Take 600 mg by mouth 2 (two) times daily.       [name] HYDROcodone-acetaminophen (NORCO/VICODIN) 5-325 MG per tablet Take 1 tablet by mouth every 6 (six) hours as needed for pain.      [name] insulin regular human CONCENTRATED (HUMULIN R) 500 UNIT/ML SOLN injection Inject 28 Units into the skin 2 (two) times daily before a meal.       . ipratropium (ATROVENT) 0.02 % nebulizer solution Take 2.5 mLs (500 mcg total) by nebulization 4 (four) times daily.  75 mL  12  . Liraglutide (VICTOZA) 18 MG/3ML SOLN injection Inject 1.8 mg into the skin daily.      [name] loratadine (CLARITIN) 10 MG tablet Take 10 mg by mouth daily as needed. For allergies      . losartan (COZAAR) 100 MG tablet Take 1 tablet (160m) by mouth daily.  90 tablet  3  . meclizine (ANTIVERT) 25 MG tablet Take 25 mg by mouth at bedtime.       . metolazone (ZAROXOLYN) 5 MG tablet Take 1 tablet (5 mg total) by mouth See admin instructions. 3 times weekly as needed for edema  15 tablet  5  . Multiple Vitamin (MULTIVITAMIN) capsule Take 1 capsule by mouth daily.        . nebivolol (BYSTOLIC) 10 MG tablet TAKE 1 TABLET (10 MG TOTAL) BY MOUTH DAILY.  90 tablet  3  . omeprazole (PRILOSEC) 40 MG capsule Take 40 mg by mouth 2 (two) times daily.       . potassium chloride SA (K-DUR,KLOR-CON) 20 MEQ tablet Take 40 mEq by mouth 3 (three) times daily.       . primidone (MYSOLINE) 50 MG tablet Take one tablet twice daily  240 tablet  3  . Probiotic Product (PROBIOTIC PO) Take 1 tablet by mouth 2 (two) times daily.      . promethazine (PHENERGAN) 25 MG tablet Take 25 mg by mouth every 6 (six) hours as needed. For nausea/vomiting      . topiramate (TOPAMAX) 50 MG tablet Take 1 tablet (50 mg total) by mouth daily.  90 tablet  3  . warfarin (COUMADIN) 5 MG tablet Take 5-7.5 mg by  mouth daily. Take 1 tablet (571m on Monday, Tuesday, Thursday, Friday.   Take 1.5 tablet (7.71m63mon Sunday, Wednesday, and saturday      . oseltamivir (TAMIFLU) 75 MG capsule Take 1 capsule (75 mg total) by mouth every 12 (twelve) hours.  9 capsule  0  . pyridOXINE (VITAMIN B-6) 100 MG tablet Take 100 mg by mouth daily.        Results for orders placed during the hospital encounter of 11/19/13 (from the past 48 hour(s))  GLUCOSE, CAPILLARY     Status: Abnormal   Collection Time    11/19/13  1:59 PM      Result Value Ref Range   Glucose-Capillary 166 (*) 70 - 99 mg/dL   No results found.  Review of Systems  Constitutional: Positive for malaise/fatigue.  Eyes: Negative.   Respiratory: Positive for stridor.   Cardiovascular: Positive for chest pain, palpitations, orthopnea and leg swelling.       Chronic congestive heart failure  Gastrointestinal: Positive for heartburn and diarrhea.       History of C. diff  Genitourinary: Negative.   Musculoskeletal:       One month history of  abscess of right long finger distal segment  Skin: Positive for rash.  Neurological: Positive for tingling, weakness and headaches.  Endo/Heme/Allergies: Negative.   Psychiatric/Behavioral: Positive for depression.    Blood pressure 125/71, pulse 73, temperature 98.6 F (37 C), temperature source Oral, resp. rate 20, SpO2 96.00%. Physical Exam  Constitutional: She is oriented to person, place, and time. She appears well-nourished.  Massively obese (403 pounds)  HENT:  Head: Normocephalic and atraumatic.  Eyes: EOM are normal. Pupils are equal, round, and reactive to light.  Neck: Normal range of motion.  Tracheostomy performed for chronic obstructive sleep apnea  Cardiovascular: Normal rate and regular rhythm.   Respiratory: She is in respiratory distress. She has wheezes.  GI: Soft. Bowel sounds are normal.  Musculoskeletal: She exhibits edema and tenderness.  Severe rubor and swelling of the right  long finger distal segment.  Is likely a chronic abscess. There is a remote possibility that this is an atypical presentation of an infected tophus.  Neurological: She is alert and oriented to person, place, and time.  Chronic peripheral neuropathy due to insulin-dependent diabetes  Psychiatric: She has a normal mood and affect. Her behavior is normal. Judgment and thought content normal.     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  The distal phalanx is noted to be destroyed by either infection tophus or combination of both on the plain x-ray.  The proper treatment for this predicament is amputation of the distal phalanx with appropriate cultures and pathology. We'll perform a PIP disarticulation and culture of the resected segment as well as pathologic evaluation. Anticipate use of Augmentin 875 mg by mouth twice a day as a postoperative antibiotic. Anesthesia consultation has been obtained by Dr. Oren Bracket attending anesthesiologist. Due to multiple complex medical problems Dr. Ola Spurr is not comfortable sedation. We will proceed with IV access monitored anesthesia care and wrist block/digital block for a salvage operation urgently at this time. Patient has been n.p.o.the entire day. We may have very significant challenges with antibiotic management postoperatively due to her GI intolerance of antibiotics and history of C. difficile diarrhea.  Rush Landmark V 11/19/2013, 2:20 PM

## 2013-11-19 NOTE — Anesthesia Postprocedure Evaluation (Signed)
  Anesthesia Post-op Note  Patient: [name]  Procedure(s) Performed: Procedure(s) with comments: AMPUTATION DIGIT (Right) - Partial amputation right long finger, Debridement right ring finger   Patient Location: PACU  Anesthesia Type: MAC  Level of Consciousness: awake and alert   Airway and Oxygen Therapy: Patient Spontanous Breathing  Post-op Pain: none  Post-op Assessment: Post-op Vital signs reviewed, Patient's Cardiovascular Status Stable and Respiratory Function Stable  Post-op Vital Signs: Reviewed  Filed Vitals:   11/19/13 1545  BP: 101/48  Pulse: 71  Temp: 36.9 C  Resp: 21    Complications: No apparent anesthesia complications

## 2013-11-19 NOTE — Discharge Instructions (Addendum)
Elevate the right hand for at least 3 days following surgery. Take Augmentin 875 mg morning and evening. Do not take Keflex. Contact our office if you have difficulties with the dressing including bleeding damage to the dressing or other predicaments. Return to our office Monday, 11/23/2013 for dressing change, review of cultures and possible whirlpool therapy. Continue to take all your routine medications.    Post Anesthesia Home Care Instructions  Activity: Get plenty of rest for the remainder of the day. A responsible adult should stay with you for 24 hours following the procedure.  For the next 24 hours, DO NOT: -Drive a car -Paediatric nurse -Drink alcoholic beverages -Take any medication unless instructed by your physician -Make any legal decisions or sign important papers.  Meals: Start with liquid foods such as gelatin or soup. Progress to regular foods as tolerated. Avoid greasy, spicy, heavy foods. If nausea and/or vomiting occur, drink only clear liquids until the nausea and/or vomiting subsides. Call your physician if vomiting continues.  Special Instructions/Symptoms: Your throat may feel dry or sore from the anesthesia or the breathing tube placed in your throat during surgery. If this causes discomfort, gargle with warm salt water. The discomfort should disappear within 24 hours.

## 2013-11-19 NOTE — Op Note (Signed)
[id number] 

## 2013-11-19 NOTE — Transfer of Care (Signed)
Immediate Anesthesia Transfer of Care Note  Patient: [name]  Procedure(s) Performed: Procedure(s) with comments: AMPUTATION DIGIT (Right) - Partial amputation right long finger, Debridement right ring finger   Patient Location: PACU  Anesthesia Type:MAC  Level of Consciousness: awake, alert , oriented and patient cooperative  Airway & Oxygen Therapy: Patient Spontanous Breathing  Post-op Assessment: Report given to PACU RN, Post -op Vital signs reviewed and stable and Patient moving all extremities  Post vital signs: Reviewed and stable  Complications: No apparent anesthesia complications

## 2013-11-19 NOTE — Anesthesia Preprocedure Evaluation (Signed)
Anesthesia Evaluation  Patient identified by MRN, date of birth, ID band Patient awake    Reviewed: Allergy & Precautions, H&P , NPO status , Patient's Chart, lab work & pertinent test results, reviewed documented beta blocker date and time   Airway      Comment: Trach in place 7.0 uncuffed trach Dental no notable dental hx. (+) Teeth Intact, Dental Advisory Given   Pulmonary shortness of breath, lying and Long-Term Oxygen Therapy, asthma , sleep apnea ,  Pt on ventilator at night.  Trached due to obesity hypoventilation syndrome. breath sounds clear to auscultation  Pulmonary exam normal       Cardiovascular hypertension, Pt. on medications and Pt. on home beta blockers + angina +CHF + dysrhythmias Rhythm:Regular Rate:Normal     Neuro/Psych Anxiety Depression negative neurological ROS     GI/Hepatic Neg liver ROS, GERD-  Medicated and Controlled,  Endo/Other  diabetes, Type 1, Insulin DependentMorbid obesity  Renal/GU Renal InsufficiencyRenal disease  negative genitourinary   Musculoskeletal   Abdominal (+) + obese,   Peds  Hematology negative hematology ROS (+)   Anesthesia Other Findings   Reproductive/Obstetrics negative OB ROS                           Anesthesia Physical Anesthesia Plan  ASA: IV  Anesthesia Plan: MAC   Post-op Pain Management:    Induction: Intravenous  Airway Management Planned: Simple Face Mask and Tracheostomy  Additional Equipment:   Intra-op Plan:   Post-operative Plan:   Informed Consent: I have reviewed the patients History and Physical, chart, labs and discussed the procedure including the risks, benefits and alternatives for the proposed anesthesia with the patient or authorized representative who has indicated his/her understanding and acceptance.   Dental advisory given  Plan Discussed with: CRNA  Anesthesia Plan Comments:          Anesthesia Quick Evaluation

## 2013-11-20 ENCOUNTER — Encounter (HOSPITAL_BASED_OUTPATIENT_CLINIC_OR_DEPARTMENT_OTHER): Payer: Self-pay | Admitting: Orthopedic Surgery

## 2013-11-20 NOTE — Op Note (Signed)
[name], [name]                 ACCOUNT NO.:  [id number]  MEDICAL RECORD NO.:  [id number]  LOCATION:                                 FACILITY:  PHYSICIAN:  Youlanda Mighty. Breylon Sherrow, [name] DATE OF BIRTH:  [birth year]  DATE OF PROCEDURE:  11/19/2013 DATE OF DISCHARGE:  11/19/2013                              OPERATIVE REPORT   PREOPERATIVE DIAGNOSES:  Chronic osteomyelitis, right long finger distal phalangeal segment with background insulin-dependent diabetes, peripheral neuropathy, and nail fold biting habit, also possible osteomyelitis of right ring finger distal phalanx, status post prior partial loss of distal phalanx due to traumatic amputation sustained more than two decades prior.  POSTOPERATIVE DIAGNOSES:  Infectious gangrene/necrosis of distal phalanx, right long finger due to nail fold abscess leading to chronic osteomyelitis of distal phalanx, and osteomyelitis of distal phalanx, right ring finger due to the nail fold biting behavior.  OPERATIONS: 1. Revision amputation with DIP disarticulation of right long finger     with aerobic and anaerobic cultures of distal phalanx. 2. Incision and drainage of distal phalanx right ring finger, with     debridement of sinus tract and curettage of distal phalanx.  OPERATING SURGEON:  Youlanda Mighty. Valita Righter, [name]  ASSISTANT:  Registered nurse.  ANESTHESIA:  2% lidocaine metacarpal head level block of right long and ring fingers.  SUPERVISING ANESTHESIOLOGIST:  Soledad Gerlach, MD.  This was a local anesthesia case, but due to [name]'s multiple medical problems, IV access was obtained and observed by Irwin Brakeman, CRNA for emergency support as needed.  INDICATIONS:  [name] is a 53 year old very ill woman referred by Dr. Kelton Pillar for evaluation of a chronic right long finger infection.  [name] has a very lengthy and complex past medical history including morbid obesity, insulin-dependent diabetes,  peripheral neuropathy, obstructive airways disease, ambulatory impairment, chronic respiratory failure, congestive heart failure, chronic diastolic heart failure, and mixed hyperlipidemia as well as other significant predicaments.  Dr. Laurann Montana has been treating an infected right long finger for the past week with IV Rocephin and oral Keflex.  [name] has multiple drug intolerances, specifically antibiotics, which cause GI upset.  She had an obvious abscess of her right long finger distal phalangeal segment.  I was contacted at 12 noon today and asked if we would see an infected fingernail.  The indication on the phone was that this was likely a paronychia.  We advised [name] to present to our office for evaluation.  Upon presentation to our office, it was apparent that she is an extraordinarily ill individual with multiple medical problems, severely morbid obesity weighing more than 400 pounds, ambulatory impairment, insulin-dependent diabetes, respiratory failure, heart failure, severe skin rashes and venous stasis, and chronic warfarin anticoagulation.  None of these issues were shared by her primary care physician's staff in the referral.  We noted that she had chronic abscess of her right long finger and drainage from the nail fold of her ring finger.  X-rays of the hand were obtained with specific attention to the long and ring fingers.  The x- rays revealed necrosis of the distal phalanx due to chronic osteomyelitis with a  probable sequestrum of the diaphysis and loss of the proximal and distal metaphysis as well as a pathologic fracture into the DIP joint for the long finger.  The ring finger had loss of bone, but with the prior trauma, it was uncertain whether or not this was due to current infection or old injury.  We had a very detailed informed consent with [name].  We pointed out that it is likely that she has a chronic severe osteomyelitis and due to her nail  biting behavior, this could be a mixed gram-negative and gram- positive infection.  The only appropriate treatment for this infection is revision amputation of the finger distal segment resecting the distal phalanx.  This will allow [name] to obtain appropriate cultures.  She has been on oral Keflex and IM Rocephin during the past week.  We will also carefully examine the ring finger and if we find signs of purulence, we will debride the sinus tract that appeared to be forming in the ring finger and debride the distal phalanx.  After detailed informed consent, [name] was brought to the operating room at this time.  DESCRIPTION OF PROCEDURE:  [name] was interviewed by Dr. Ola Spurr of Anesthesia.  Due to her multiple medical problems, Dr. Ola Spurr indicated that she was not a candidate for sedation.  I agreed 100% with his assessment.  I did, however, ask for IV access and a CRNA to standby for safe administration of local anesthesia with her multiple medical problems.  Her right hand was marked as the proper surgical site, and after detailed informed consent, questions were invited and answered in detail.  She was transferred to room #1 of the [facility].  Due to her morbid obesity, we could not use an operating room table.  She was kept on her bed and her arm was placed on a well-padded Mayo stand.  After Betadine prep of her hand, we proceeded to infiltrate 2% lidocaine at metacarpal head level for the retained digital blocks of the right long and ring fingers.  After few moments, complete anesthesia of both fingers was achieved. The right hand and arm were then prepped with Betadine soap and solution, sterilely draped.  A gauze wrap was used to exsanguinate the right long finger followed by placement of a half-inch Penrose drain over the proximal phalangeal segment.  Fishmouth flaps were planned to allow DIP disarticulation.  For the ring finger, our plan  was to perform a thorough debridement of the distal phalanx.  Appropriate cultures will be obtained for aerobic and anaerobic growth directly from the bone.  Following routine surgical time-out, procedure commenced with fishmouth incisions of the long finger followed by disarticulation of the DIP joint by release of the collateral ligaments, flexor and extensor tendons.  The entire nail mechanism was removed with the distal phalanx. The skin flaps were undermined, the neurovascular structure was identified, the digital arteries were electrocauterized with bipolar current, and the proper digital nerves were transected with a sharp 15 scalpel blade.  Hemostasis was achieved along the wound margins with bipolar cautery and was challenging due to her warfarin anticoagulation.  The ring finger was similarly exsanguinated and a half-inch Penrose drain placed over the proximal phalangeal segment.  I then used a scalpel to excise the sinus tract and curetted it recovering purulent material.  The distal phalanx was softened and appeared to have chronic osteomyelitis as well.  It appears that some of the bone loss noted on the preoperative x-rays  is due to likely osteomyelitis of the ring finger.  After curettage of the distal phalanx, we obtained very thorough aerobic and anaerobic cultures from the necrotic distal phalanges and sent the long finger specimen to Pathology for proper histopathologic evaluation.  The long finger wound was then repaired with trauma sutures of 4-0 nylon and simple interrupted sutures.  The ring finger was dressed open.  The tourniquet was released and hemostasis was obtained by holding direct pressure for 10 minutes.  There was copious bleeding due to the warfarin anticoagulation.  The hand was then placed in a voluminous gauze dressing with Xeroflo directly on the wound margins, sterile Kling followed by [place].  We will encourage [name] to elevate her hand  for the next 3 days. She may use her walker to ambulate.  She was placed on Augmentin 875 mg p.o. b.i.d.  She was cautioned to take yogurt as a daily dietary supplement to help with the gut flora and to report difficulties with her GI floor to Dr. Laurann Montana.  She has hydrocodone and she will this for postoperative analgesia.  There were no apparent surgical complications.     Youlanda Mighty Jasim Harari, [name]     RVS/MEDQ  D:  11/19/2013  T:  11/20/2013  Job:  [id number]  cc:   Milford Cage. Laurann Montana, [name]

## 2013-11-23 ENCOUNTER — Ambulatory Visit: Payer: Medicare Other | Admitting: Neurology

## 2013-11-23 LAB — TISSUE CULTURE

## 2013-11-24 LAB — ANAEROBIC CULTURE

## 2013-12-03 ENCOUNTER — Ambulatory Visit (INDEPENDENT_AMBULATORY_CARE_PROVIDER_SITE_OTHER): Payer: Medicare Other | Admitting: Internal Medicine

## 2013-12-03 ENCOUNTER — Encounter: Payer: Self-pay | Admitting: Internal Medicine

## 2013-12-03 ENCOUNTER — Ambulatory Visit: Payer: Medicare Other | Admitting: Internal Medicine

## 2013-12-03 VITALS — BP 126/71 | HR 82 | Temp 98.3°F | Wt >= 6400 oz

## 2013-12-03 DIAGNOSIS — M8660 Other chronic osteomyelitis, unspecified site: Secondary | ICD-10-CM

## 2013-12-03 NOTE — Progress Notes (Signed)
   Subjective:    Patient ID: [name], female    DOB: 17-Nov-1960, 53 y.o.   MRN: [id number]  HPI Here for a new patient visit.  Has a history of right hand ring finger osteomyelitis remotely and recently has had issues with her right hand long finger.  She was initially being seen by her PCP for infection of the nail bed of the long finger and pus and started on empiric antibiotics including ceftriaxone and keflex.  It continued to worsen and he was seen by Dr. Daylene Katayama of hand surgery at the end of May for the infection and was quite ill and taken to OR for debridement (with local finger block).  There was necrosis and gangrene of finger and she underwent amputation of with DIP disarticulation of finger and cultures from OR on bone grew MRSA, doxycycline and Bactrim sensitive.  She though was continued on Augmentin.  I believe from the operative report that the infected portion was removed and the surgical pathology report show that the infected portion margin was hisologicaly viable so infected portion removed.  No fever, no chills, wound has healed.  No drainage, sutures removed. No pain.     Review of Systems  Constitutional: Negative for fever and chills.  Musculoskeletal:       Finger with no pain, erythema  Skin: Positive for wound.  Neurological: Negative for dizziness.  Hematological: Negative for adenopathy.       Objective:   Physical Exam  Constitutional:  Morbidly obese  HENT:  + tracheostomy  Eyes: No scleral icterus.  Musculoskeletal:  Right long finger s/p amputation.  No drainage, no erythema, no tenderness.  Dried scab.    Skin: No rash noted.  Psychiatric: She has a normal mood and affect.          Assessment & Plan:

## 2013-12-03 NOTE — Assessment & Plan Note (Addendum)
Now s/p amputation.  Healing well.  Completely removed so cured.  At this time I do not suspect any residual infection based on path, history and being off antibiotics directed at MRSA.  I will check as well for ESR and CrP to be sure no concerns on labs and will consider MRI if they are significantly elevated (like ESR > 100).   Return PRN unless concerns.

## 2013-12-04 LAB — CBC WITH DIFFERENTIAL/PLATELET
BASOS PCT: 1 % (ref 0–1)
Basophils Absolute: 0.1 10*3/uL (ref 0.0–0.1)
EOS ABS: 0.1 10*3/uL (ref 0.0–0.7)
Eosinophils Relative: 2 % (ref 0–5)
HEMATOCRIT: 40 % (ref 36.0–46.0)
Hemoglobin: 13.5 g/dL (ref 12.0–15.0)
Lymphocytes Relative: 28 % (ref 12–46)
Lymphs Abs: 1.8 10*3/uL (ref 0.7–4.0)
MCH: 30.4 pg (ref 26.0–34.0)
MCHC: 33.8 g/dL (ref 30.0–36.0)
MCV: 90.1 fL (ref 78.0–100.0)
MONO ABS: 1.2 10*3/uL — AB (ref 0.1–1.0)
Monocytes Relative: 18 % — ABNORMAL HIGH (ref 3–12)
Neutro Abs: 3.3 10*3/uL (ref 1.7–7.7)
Neutrophils Relative %: 51 % (ref 43–77)
Platelets: 145 10*3/uL — ABNORMAL LOW (ref 150–400)
RBC: 4.44 MIL/uL (ref 3.87–5.11)
RDW: 15.5 % (ref 11.5–15.5)
WBC: 6.5 10*3/uL (ref 4.0–10.5)

## 2013-12-04 LAB — C-REACTIVE PROTEIN: CRP: 1.7 mg/dL — ABNORMAL HIGH (ref <0.60)

## 2013-12-04 LAB — SEDIMENTATION RATE: Sed Rate: 36 mm/hr — ABNORMAL HIGH (ref 0–22)

## 2013-12-11 ENCOUNTER — Ambulatory Visit (INDEPENDENT_AMBULATORY_CARE_PROVIDER_SITE_OTHER): Payer: Medicare Other | Admitting: Neurology

## 2013-12-11 ENCOUNTER — Encounter: Payer: Self-pay | Admitting: Neurology

## 2013-12-11 VITALS — BP 104/60 | HR 69 | Temp 98.3°F | Wt >= 6400 oz

## 2013-12-11 DIAGNOSIS — E1149 Type 2 diabetes mellitus with other diabetic neurological complication: Secondary | ICD-10-CM

## 2013-12-11 DIAGNOSIS — E1349 Other specified diabetes mellitus with other diabetic neurological complication: Secondary | ICD-10-CM

## 2013-12-11 DIAGNOSIS — G988 Other disorders of nervous system: Secondary | ICD-10-CM

## 2013-12-11 DIAGNOSIS — G250 Essential tremor: Secondary | ICD-10-CM

## 2013-12-11 DIAGNOSIS — E0849 Diabetes mellitus due to underlying condition with other diabetic neurological complication: Secondary | ICD-10-CM

## 2013-12-11 DIAGNOSIS — G252 Other specified forms of tremor: Secondary | ICD-10-CM

## 2013-12-11 DIAGNOSIS — E1142 Type 2 diabetes mellitus with diabetic polyneuropathy: Secondary | ICD-10-CM

## 2013-12-11 NOTE — Patient Instructions (Addendum)
1.  Reduce topamax to 25mg  (one-half tablet) for 7 days, then stop. 2.  Take neurontin 100mg  in the morning, 200mg  in the afternoon, 200mg  at bedtime 3.  Continue Cymbalta 30mg  twice daily 4.  Continue Mysoline 50mg  twice daily 5.  Return to clinic in 82-months

## 2013-12-11 NOTE — Progress Notes (Signed)
Note faxed.

## 2013-12-11 NOTE — Progress Notes (Signed)
Follow-up Visit   Date: 12/11/2013    [name] MRN: [id number] DOB: Nov 10, 1960   Interim History: [name] is a 53 y.o. 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  The patient was accompanied to the clinic by caregiver Marcy Salvo) who also provides collateral information.    History of present illness: She was previously a patient of Dr. Erling Cruz, whom she has seen for tremors and peripheral neuropathy for at least the past 10-15 years. She first started having heaviness of her leg left with spells of numbness/tingling since the early 1990s. Her numbness/tingling involves the areas distal to the left knee and right foot. She has more numbness and heaviness than tingling. She does not have burning sensation. She takes Norco which sometimes helps the pain. Last winter (December 2013 - April 2014), she fell six times, but has not fallen since then. She has been ambulating with a rollator since 2012. She started using a cane in 2009 and prior to that was walking independently. When she initially presented to me, she was taking Cymbalta 30mg  BID, neurontin 100mg  QID (9am, noon, 5pm, 10pm), topamax 50mg  daily (remote history of kidney stones), and fish oil. She tried neurontin 600mg , but became very groggy and sleepy. She has tried Lyrica, but developed joint pain and vision problems. She feels the most benefit from Cymbalta because it helps with depression. She reports having decreased sweating, early satiety, dry eyes and dry mouth.   Also about 10+ years ago, she started having tremors of her hands, worse when she is trying to use her hands.  Symptoms have been present for at least the past 10 years. She is currently taking Mysoline 50mg  0.5 tablet in the morning and 1.5 tablet in the evening.  She was evaluated by Dr. Cyndy Freeze for neck pain in 2007 and found not to be a surgical candidate. She has steroid injections in the past.   She has a caregiver that comes 7-days a week for ~5 hours. She has been on disability due to morbid obesity since 1999.  - Follow-up 12/11/2013:  At her last visit, her neurontin was increased to 100/200/200 (9am, 2pm 7pm) which tends to keep her pain adequately controlled.  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  Tremors are much improved since started primidone 50 mg twice daily. She continues to feel lightheaded at times especially making positional changes, denies any world spinning sensation. She has no new neurological complaints.   Medications:  Current Outpatient Prescriptions on File Prior to Visit  Medication Sig Dispense Refill  . acetaminophen (TYLENOL) 500 MG tablet Take 1,000 mg by mouth every 6 (six) hours as needed. For pain      . albuterol (PROVENTIL) (2.5 MG/3ML) 0.083% nebulizer solution Take 2.5 mg by nebulization every 4 (four) hours as needed. For shortness of breath      . ALPRAZolam (XANAX) 0.5 MG tablet Take 0.5 mg by mouth 2 (two) times daily as needed. For anxiety      . amoxicillin-clavulanate (AUGMENTIN) 875-125 MG per tablet Take 1 tablet by mouth 2 (two) times daily.  28 tablet  0  . atorvastatin (LIPITOR) 10 MG tablet Take 10 mg by mouth  at bedtime.       . B Complex Vitamins (VITAMIN B COMPLEX PO) Take 1 tablet by mouth daily.       . budesonide (PULMICORT) 0.25 MG/2ML nebulizer solution Take 2 mLs (0.25 mg total) by nebulization 2 (two) times daily. For shortness of breath.  60 mL  12  . bumetanide (BUMEX) 2 MG tablet Take 1 tablet (2 mg total) by mouth daily.  90 tablet  3  . busPIRone (BUSPAR) 5 MG tablet Take 7.5 mg by mouth 2  (two) times daily.       . Canagliflozin (INVOKANA) 100 MG TABS Take 100 mg by mouth daily.       . cholestyramine (QUESTRAN) 4 G packet Take 1 packet by mouth as needed. For loose stools, IBS.      . clotrimazole (LOTRIMIN) 1 % cream Apply 1 application topically daily. To stomach      . colchicine 0.6 MG tablet Take 0.6 mg by mouth daily.      . DULoxetine (CYMBALTA) 30 MG capsule Take 1 capsule (30 mg total) by mouth 2 (two) times daily.  180 capsule  3  . ergocalciferol (VITAMIN D2) 50000 UNITS capsule Take 50,000 Units by mouth once a week. Take on Fridays      . Febuxostat (ULORIC) 80 MG TABS Take 80 mg by mouth at bedtime.       . fesoterodine (TOVIAZ) 4 MG TB24 Take 4 mg by mouth daily.        . fish oil-omega-3 fatty acids 1000 MG capsule Take 1 g by mouth 2 (two) times daily.       [name] gabapentin (NEURONTIN) 100 MG capsule Take one tablet in the morning, 2 tablets in the afternoon, and 2 tablets at bedtime  480 capsule  3  . guaiFENesin (MUCINEX) 600 MG 12 hr tablet Take 600 mg by mouth 2 (two) times daily.       [name] HYDROcodone-acetaminophen (NORCO/VICODIN) 5-325 MG per tablet Take 1 tablet by mouth every 6 (six) hours as needed for pain.      [name] insulin regular human CONCENTRATED (HUMULIN R) 500 UNIT/ML SOLN injection Inject 28 Units into the skin 2 (two) times daily before a meal.       . ipratropium (ATROVENT) 0.02 % nebulizer solution Take 2.5 mLs (500 mcg total) by nebulization 4 (four) times daily.  75 mL  12  . Liraglutide (VICTOZA) 18 MG/3ML SOLN injection Inject 1.8 mg into the skin daily.      [name] loratadine (CLARITIN) 10 MG tablet Take 10 mg by mouth daily as needed. For allergies      . losartan (COZAAR) 100 MG tablet Take 1 tablet (100mg ) by mouth daily.  90 tablet  3  . meclizine (ANTIVERT) 25 MG tablet Take 25 mg by mouth at bedtime.       . metolazone (ZAROXOLYN) 5 MG tablet Take 1 tablet (5 mg total) by mouth See admin instructions. 3 times weekly as needed for edema  15  tablet  5  . Multiple Vitamin (MULTIVITAMIN) capsule Take 1 capsule by mouth daily.        . nebivolol (BYSTOLIC) 10 MG tablet TAKE 1 TABLET (10 MG TOTAL) BY MOUTH DAILY.  90 tablet  3  . omeprazole (PRILOSEC) 40 MG capsule Take 40 mg by mouth 2 (two) times daily.       [name] oseltamivir (TAMIFLU) 75 MG capsule Take 1 capsule (75 mg total) by mouth every 12 (twelve) hours.  9 capsule  0  . potassium chloride SA (K-DUR,KLOR-CON) 20 MEQ tablet Take 40 mEq by mouth 3 (three) times daily.       . primidone (MYSOLINE) 50 MG tablet Take one tablet twice daily  240 tablet  3  . Probiotic Product (PROBIOTIC PO) Take 1 tablet by mouth 2 (two) times daily.      . promethazine (PHENERGAN) 25 MG tablet Take 25 mg by mouth every 6 (six) hours as needed. For nausea/vomiting      . pyridOXINE (VITAMIN B-6) 100 MG tablet Take 100 mg by mouth daily.      [name] topiramate (TOPAMAX) 50 MG tablet Take 1 tablet (50 mg total) by mouth daily.  90 tablet  3  . warfarin (COUMADIN) 5 MG tablet Take 5-7.5 mg by mouth daily. Take 1 tablet (5mg ) on Monday, Tuesday, Thursday, Friday.   Take 1.5 tablet (7.5mg ) on Sunday, Wednesday, and saturday       No current facility-administered medications on file prior to visit.    Allergies:  Allergies  Allergen Reactions  . Molds & Smuts Shortness Of Breath and Rash  . Nitroglycerin Er Nausea And Vomiting  . Accolate [Zafirlukast] Other (See Comments)    Reaction unknown  . Allopurinol Other (See Comments)    GI problems   . Amoxicillin-Pot Clavulanate     Other reaction(s): Unknown  . Ciprofloxacin Other (See Comments)    GI problems  . Codeine Other (See Comments)    Heart problems  . Doxycycline Other (See Comments)    Gastric problems  . Fexofenadine Other (See Comments)    Hurts joints/pain  . Keflex [Cephalexin] Diarrhea and Nausea Only  . Morphine Other (See Comments)    unknown  . Sulfa Drugs Cross Reactors Nausea And Vomiting    nausea  . Sulfonamide Derivatives  Nausea Only     Review of Systems:  CONSTITUTIONAL: No fevers, chills, night sweats, or weight loss.  + Generalized tiredness EYES: No visual changes or eye pain ENT: No hearing changes.  No history of nose bleeds.   RESPIRATORY: No cough, wheezing and shortness of breath.   CARDIOVASCULAR: Negative for chest pain, and palpitations.   GI: Negative for abdominal discomfort, blood in stools or black stools.  No recent change in bowel habits.   GU:  No history of incontinence.   MUSCLOSKELETAL: + history of joint pain or swelling.  No myalgias.   SKIN: Negative for lesions, rash, and itching.   ENDOCRINE: Negative for cold or heat intolerance, polydipsia or goiter.   PSYCH:  + depression or anxiety symptoms.   NEURO: As Above.   Vital Signs:  BP 104/60  Pulse 69  Temp(Src) 98.3 F (36.8 C) (Oral)  Wt 413 lb (187.336 kg)  SpO2 96%  Neurological Exam: MENTAL STATUS including orientation to time, place, person, recent and remote memory, attention span and concentration, language, and fund of knowledge is normal.  Speech is not dysarthric.  CRANIAL NERVES:  Pupils equal round and reactive to light.  Normal conjugate, extra-ocular eye movements in all directions of gaze.  No ptosis. Normal facial sensation.  Face is symmetric. Palate elevates symmetrically.  Tongue is midline.  MOTOR: Mild bilateral intention tremor of the hands. No atrophy or fasciculations. No pronator drift. Tone is normal.   Right Upper Extremity:    Left Upper Extremity:    Deltoid  5/5   Deltoid  5/5   Biceps  5/5   Biceps  5/5   Triceps  5/5   Triceps  5/5   Wrist extensors  5/5   Wrist extensors  5/5   Wrist flexors  5/5   Wrist flexors  5/5   Finger extensors  5/5   Finger extensors  5/5   Finger flexors  5/5   Finger flexors  5/5   Dorsal interossei  4/5   Dorsal interossei  4/5   Abductor pollicis  5/5   Abductor pollicis  5/5   Tone (Ashworth scale)  0   Tone (Ashworth scale)  0    Right Lower  Extremity:    Left Lower Extremity:    Hip flexors  4+/5   Hip flexors  4+/5   Hip extensors  5/5   Hip extensors  5/5   Knee flexors  5/5   Knee flexors  5/5   Knee extensors  5/5   Knee extensors  5/5   Dorsiflexors  4-/5   Dorsiflexors  4-/5   Plantarflexors  4/5   Plantarflexors  4/5   Toe extensors  2/5   Toe extensors  2/5   Toe flexors  2/5   Toe flexors  2/5   Tone (Ashworth scale)  0   Tone (Ashworth scale)  0    MSRs:  Right      Left  brachioradialis  2+   brachioradialis  2+   biceps  1+   biceps  1+   triceps  1+   triceps  1+   patellar  0   Patellar  0   ankle jerk  0   ankle jerk  0    SENSORY: All sensory modalities are reduced in gradient pattern distal to the knees and mild changes in the hands.   GAIT: Not assessed, wheelchair-bound  Data: Lab Results  Component Value Date   HGBA1C 6.1* 04/23/2013   Lab Results  Component Value Date   TSH 1.657 04/23/2013     IMPRESSION: 1. Length-dependent generalized sensorimotor peripheral neuropathy due to diabetes   - Predominately large fiber > small fiber, at the levels of the knees with mild sensory loss in the hands also and mild bilateral foot drop   - Currently taking neurontin 100/200/200, cymbalta 60mg /d, TPM 50mg /d.  Previously tried Lyrica (vision changes, swelling).  - Try to wean her off Topamax as I am not sure how much of an added benefit she is experiencing  - Given her cardiac co-morbidities and morbid obesity, do not feel she is a good candidate for TCAs.   - Encouraged patient to continue tight glycemic control, Which she is doing  2. Benign essential tremors   -  Well-controlled on primidone 100mg /d   -  Not a candidate for propranolol due to history of asthma  3. Hypoventilation syndrome due to obesity s/p trach, vent-dependent at night  4. Depression  5. HTN  6. Asthma  7. Gout  8. DVT on coumadin    PLAN/RECOMMENDATIONS:  1.  Reduce topamax to 25mg  (one-half tablet) for 7 days, then  stop. If she develops worsening neuropathy, okay to restart 2.  Take neurontin 100mg  in the morning, 200mg  in the afternoon, 200mg  at bedtime 3.  Continue Cymbalta 30mg  twice daily 4.  Continue Mysoline 50mg  twice daily 5.  Consider occupational therapy for hand weakness, patient declined at today's visit.  6.  Return to clinic in 3-months    The duration of this appointment visit was 30 minutes of face-to-face time with the patient.  Greater than 50% of  this time was spent in counseling, explanation of diagnosis, planning of further management, and coordination of care.   Thank you for allowing me to participate in patient's care.  If I can answer any additional questions, I would be pleased to do so.    Sincerely,    Donika K. Posey Pronto, DO

## 2013-12-16 LAB — FUNGUS CULTURE W SMEAR: FUNGAL SMEAR: NONE SEEN

## 2013-12-30 ENCOUNTER — Encounter (HOSPITAL_COMMUNITY): Payer: Self-pay | Admitting: Emergency Medicine

## 2013-12-30 ENCOUNTER — Emergency Department (HOSPITAL_COMMUNITY): Payer: Medicare Other

## 2013-12-30 ENCOUNTER — Emergency Department (HOSPITAL_COMMUNITY)
Admission: EM | Admit: 2013-12-30 | Discharge: 2013-12-30 | Disposition: A | Discharge status: Home / Self Care | Payer: Medicare Other | Attending: Emergency Medicine | Admitting: Emergency Medicine

## 2013-12-30 DIAGNOSIS — F3289 Other specified depressive episodes: Secondary | ICD-10-CM | POA: Diagnosis not present

## 2013-12-30 DIAGNOSIS — E876 Hypokalemia: Secondary | ICD-10-CM | POA: Diagnosis not present

## 2013-12-30 DIAGNOSIS — E785 Hyperlipidemia, unspecified: Secondary | ICD-10-CM | POA: Diagnosis not present

## 2013-12-30 DIAGNOSIS — Z7901 Long term (current) use of anticoagulants: Secondary | ICD-10-CM | POA: Diagnosis not present

## 2013-12-30 DIAGNOSIS — I129 Hypertensive chronic kidney disease with stage 1 through stage 4 chronic kidney disease, or unspecified chronic kidney disease: Secondary | ICD-10-CM | POA: Diagnosis not present

## 2013-12-30 DIAGNOSIS — Z8701 Personal history of pneumonia (recurrent): Secondary | ICD-10-CM | POA: Diagnosis not present

## 2013-12-30 DIAGNOSIS — J45901 Unspecified asthma with (acute) exacerbation: Secondary | ICD-10-CM | POA: Insufficient documentation

## 2013-12-30 DIAGNOSIS — M109 Gout, unspecified: Secondary | ICD-10-CM | POA: Insufficient documentation

## 2013-12-30 DIAGNOSIS — D696 Thrombocytopenia, unspecified: Secondary | ICD-10-CM | POA: Diagnosis not present

## 2013-12-30 DIAGNOSIS — Z8669 Personal history of other diseases of the nervous system and sense organs: Secondary | ICD-10-CM | POA: Insufficient documentation

## 2013-12-30 DIAGNOSIS — Z8742 Personal history of other diseases of the female genital tract: Secondary | ICD-10-CM | POA: Diagnosis not present

## 2013-12-30 DIAGNOSIS — Z880 Allergy status to penicillin: Secondary | ICD-10-CM | POA: Diagnosis not present

## 2013-12-30 DIAGNOSIS — Z794 Long term (current) use of insulin: Secondary | ICD-10-CM | POA: Diagnosis not present

## 2013-12-30 DIAGNOSIS — K219 Gastro-esophageal reflux disease without esophagitis: Secondary | ICD-10-CM | POA: Diagnosis not present

## 2013-12-30 DIAGNOSIS — I509 Heart failure, unspecified: Secondary | ICD-10-CM | POA: Diagnosis not present

## 2013-12-30 DIAGNOSIS — N189 Chronic kidney disease, unspecified: Secondary | ICD-10-CM | POA: Insufficient documentation

## 2013-12-30 DIAGNOSIS — E119 Type 2 diabetes mellitus without complications: Secondary | ICD-10-CM | POA: Diagnosis not present

## 2013-12-30 DIAGNOSIS — Z8739 Personal history of other diseases of the musculoskeletal system and connective tissue: Secondary | ICD-10-CM | POA: Diagnosis not present

## 2013-12-30 DIAGNOSIS — F329 Major depressive disorder, single episode, unspecified: Secondary | ICD-10-CM | POA: Diagnosis not present

## 2013-12-30 DIAGNOSIS — R1031 Right lower quadrant pain: Secondary | ICD-10-CM

## 2013-12-30 DIAGNOSIS — Z86718 Personal history of other venous thrombosis and embolism: Secondary | ICD-10-CM | POA: Diagnosis not present

## 2013-12-30 DIAGNOSIS — F411 Generalized anxiety disorder: Secondary | ICD-10-CM | POA: Diagnosis not present

## 2013-12-30 LAB — URINALYSIS, ROUTINE W REFLEX MICROSCOPIC
Bilirubin Urine: NEGATIVE
Hgb urine dipstick: NEGATIVE
Ketones, ur: NEGATIVE mg/dL
LEUKOCYTES UA: NEGATIVE
Nitrite: NEGATIVE
PROTEIN: NEGATIVE mg/dL
Specific Gravity, Urine: 1.025 (ref 1.005–1.030)
Urobilinogen, UA: 0.2 mg/dL (ref 0.0–1.0)
pH: 5.5 (ref 5.0–8.0)

## 2013-12-30 LAB — CBC WITH DIFFERENTIAL/PLATELET
BASOS ABS: 0 10*3/uL (ref 0.0–0.1)
Basophils Relative: 0 % (ref 0–1)
EOS PCT: 2 % (ref 0–5)
Eosinophils Absolute: 0.1 10*3/uL (ref 0.0–0.7)
HEMATOCRIT: 39.4 % (ref 36.0–46.0)
Hemoglobin: 12.8 g/dL (ref 12.0–15.0)
LYMPHS PCT: 26 % (ref 12–46)
Lymphs Abs: 1.7 10*3/uL (ref 0.7–4.0)
MCH: 30.5 pg (ref 26.0–34.0)
MCHC: 32.5 g/dL (ref 30.0–36.0)
MCV: 94 fL (ref 78.0–100.0)
MONO ABS: 1 10*3/uL (ref 0.1–1.0)
Monocytes Relative: 15 % — ABNORMAL HIGH (ref 3–12)
Neutro Abs: 3.6 10*3/uL (ref 1.7–7.7)
Neutrophils Relative %: 57 % (ref 43–77)
Platelets: 117 10*3/uL — ABNORMAL LOW (ref 150–400)
RBC: 4.19 MIL/uL (ref 3.87–5.11)
RDW: 15 % (ref 11.5–15.5)
WBC: 6.4 10*3/uL (ref 4.0–10.5)

## 2013-12-30 LAB — URINE MICROSCOPIC-ADD ON

## 2013-12-30 LAB — I-STAT CHEM 8, ED
BUN: 13 mg/dL (ref 6–23)
CALCIUM ION: 1.1 mmol/L — AB (ref 1.12–1.23)
Chloride: 98 mEq/L (ref 96–112)
Creatinine, Ser: 0.9 mg/dL (ref 0.50–1.10)
Glucose, Bld: 200 mg/dL — ABNORMAL HIGH (ref 70–99)
HCT: 41 % (ref 36.0–46.0)
Hemoglobin: 13.9 g/dL (ref 12.0–15.0)
Potassium: 3.1 mEq/L — ABNORMAL LOW (ref 3.7–5.3)
Sodium: 138 mEq/L (ref 137–147)
TCO2: 27 mmol/L (ref 0–100)

## 2013-12-30 MED ORDER — POTASSIUM CHLORIDE CRYS ER 20 MEQ PO TBCR
40.0000 meq | EXTENDED_RELEASE_TABLET | Freq: Once | ORAL | Status: AC
  Administered 2013-12-30: 40 meq via ORAL
  Filled 2013-12-30: qty 2

## 2013-12-30 MED ORDER — SODIUM CHLORIDE 0.9 % IV BOLUS (SEPSIS)
1000.0000 mL | Freq: Once | INTRAVENOUS | Status: AC
  Administered 2013-12-30: 1000 mL via INTRAVENOUS

## 2013-12-30 MED ORDER — IOHEXOL 300 MG/ML  SOLN
25.0000 mL | Freq: Once | INTRAMUSCULAR | Status: AC | PRN
  Administered 2013-12-30: 25 mL via ORAL

## 2013-12-30 MED ORDER — ONDANSETRON HCL 4 MG PO TABS: 4.0000 mg | ORAL_TABLET | Freq: Four times a day (QID) | ORAL | Status: DC

## 2013-12-30 MED ORDER — ONDANSETRON HCL 4 MG/2ML IJ SOLN
4.0000 mg | Freq: Once | INTRAMUSCULAR | Status: AC
  Administered 2013-12-30: 4 mg via INTRAVENOUS
  Filled 2013-12-30: qty 2

## 2013-12-30 MED ORDER — FENTANYL CITRATE 0.05 MG/ML IJ SOLN
50.0000 ug | Freq: Once | INTRAMUSCULAR | Status: AC
  Administered 2013-12-30: 50 ug via INTRAVENOUS
  Filled 2013-12-30: qty 2

## 2013-12-30 MED ORDER — IOHEXOL 300 MG/ML  SOLN: 100.0000 mL | Freq: Once | INTRAMUSCULAR | Status: DC | PRN

## 2013-12-30 MED ORDER — OXYCODONE-ACETAMINOPHEN 5-325 MG PO TABS: 1.0000 | ORAL_TABLET | Freq: Four times a day (QID) | ORAL | Status: DC | PRN

## 2013-12-30 NOTE — Discharge Instructions (Signed)

## 2013-12-30 NOTE — ED Notes (Signed)
Patient presents to ED via [place]. Patient c/o of abdominal pain RLQ x2 days. Patient states that she is nauseous, denies any vomiting. RLQ tender upon palpation. Pt also feels that she is "constipated" last BM yesterday. Pt A&Ox4. No acute distress noted at this time.

## 2013-12-30 NOTE — ED Notes (Signed)
Pt placed in bed. Pt monitored by 5-lead, bp cuff, and pulse ox.

## 2013-12-30 NOTE — ED Notes (Signed)
Phlebotomy at bedside.

## 2013-12-30 NOTE — ED Provider Notes (Signed)
CSN: [id number]     Arrival date & time 12/30/13  0035 History   First MD Initiated Contact with Patient 12/30/13 0155     Chief Complaint  Patient presents with  . Abdominal Pain    (Consider location/radiation/quality/duration/timing/severity/associated sxs/prior Treatment) HPI Comments: Patient is a 53 year old female with an extensive past medical history including diabetes mellitus, hepatosplenomegaly, CHF, CAD, and DVT who presents to the emergency department today for right lower quadrant abdominal pain. Patient states that pain started at noon yesterday and has been gradually worsening since onset. Pain radiates intermittently across to her left lower abdomen. She states that she took Norco for pain without relief. She states that when the pain worsens she feels as though she splints when inhaling. Symptoms associated with nausea. Patient denies emesis, but states she cannot vomit secondary to her trach placement. She has had a tracheostomy since 2001 for sleep apnea. Abdominal surgical history significant for cholecystectomy. Patient denies associated fever, chest pain, shortness of breath, urinary symptoms, melena or hematochezia, vaginal complaints, and numbness/weakness.  Patient is a 53 y.o. female presenting with abdominal pain. The history is provided by the patient. No language interpreter was used.  Abdominal Pain Associated symptoms: nausea   Associated symptoms: no chest pain, no dysuria, no fever, no hematuria and no shortness of breath     Past Medical History  Diagnosis Date  . OSA (obstructive sleep apnea)   . Morbid obesity   . Depressive disorder, not elsewhere classified   . Diabetes mellitus   . Asthma   . Dyslipidemia   . Clostridium difficile enterocolitis 2012  . Splenomegaly 06/01/2011  . Thrombocytopenia 06/01/2011  . Hepatosplenomegaly   . Gout   . GERD (gastroesophageal reflux disease)   . Osteoarthritis   . Polyneuropathy   . Urinary incontinence   .  Amenorrhea   . CHF (congestive heart failure)   . Hyperlipidemia   . Leg swelling   . Nausea   . Weakness   . Palpitations   . PONV (postoperative nausea and vomiting)   . Anginal pain     no chest pain in years  . Dysrhythmia     heart palpations  . Anxiety   . Pneumonia   . Shortness of breath   . Chronic kidney disease     overactive bladder  . Peripheral neuropathy   . DVT (deep venous thrombosis)    Past Surgical History  Procedure Laterality Date  . Cholecystectomy    . Tracheostomy  2001  . Endometrial ablation    . Skin graft    . Finger surgery      ring finger on right hand  . Esophageal dilation  2003  . Breast biopsy      needle core, left  . Dilation and curettage of uterus      times 2  . Tracheal dilitation  01/24/2012    Procedure: TRACHEAL DILITATION;  Surgeon: Melissa Montane, MD;  Location: [facility];  Service: ENT;  Laterality: N/A;  Trache change with possible dilation, from size 6 to size 7 uncuffed  . Amputation Right 11/19/2013    Procedure: AMPUTATION DIGIT;  Surgeon: Cammie Sickle, MD;  Location: [facility];  Service: Orthopedics;  Laterality: Right;  Partial amputation right long finger, Debridement right ring finger    Family History  Problem Relation Age of Onset  . Diabetes Father   . Hypertension Father   . Dementia Father     Deceased, 74  .  Pneumonia Father   . Heart disease Mother   . Clotting disorder Mother   . Hypertension Mother   . Heart attack Mother     Deceased, 2  . Multiple myeloma Paternal Grandmother   . Cancer Paternal Grandmother     multiple myoloma  . Heart disease Paternal Grandfather   . Kidney disease Maternal Grandmother   . Hypertension Sister   . Cancer Paternal Aunt     breast  . Cancer Cousin     breast - all three   History  Substance Use Topics  . Smoking status: Never Smoker   . Smokeless tobacco: Never Used  . Alcohol Use: No     Comment: occasional - once or twice a year   OB  History   Grav Para Term Preterm Abortions TAB SAB Ect Mult Living   0               Review of Systems  Constitutional: Negative for fever.  Respiratory: Negative for shortness of breath.   Cardiovascular: Negative for chest pain.  Gastrointestinal: Positive for nausea and abdominal pain. Negative for blood in stool.  Genitourinary: Negative for dysuria and hematuria.  All other systems reviewed and are negative.    Allergies  Molds & smuts; Nitroglycerin er; Accolate; Allopurinol; Amoxicillin-pot clavulanate; Ciprofloxacin; Codeine; Doxycycline; Fexofenadine; Keflex; Morphine; Sulfa drugs cross reactors; and Sulfonamide derivatives  Home Medications   Prior to Admission medications   Medication Sig Start Date End Date Taking? Authorizing Provider  acetaminophen (TYLENOL) 500 MG tablet Take 1,000 mg by mouth every 6 (six) hours as needed for mild pain. For pain   Yes Historical Provider, MD  albuterol (PROVENTIL) (2.5 MG/3ML) 0.083% nebulizer solution Take 2.5 mg by nebulization every 4 (four) hours as needed. For shortness of breath 05/28/11  Yes Chesley Mires, MD  ALPRAZolam Duanne Moron) 0.5 MG tablet Take 0.5 mg by mouth 2 (two) times daily as needed. For anxiety   Yes Historical Provider, MD  atorvastatin (LIPITOR) 10 MG tablet Take 10 mg by mouth at bedtime.    Yes Historical Provider, MD  B Complex Vitamins (VITAMIN B COMPLEX PO) Take 1 tablet by mouth daily.    Yes Historical Provider, MD  budesonide (PULMICORT) 0.25 MG/2ML nebulizer solution Take 0.25 mg by nebulization 2 (two) times daily as needed (shortness of breath).   Yes Historical Provider, MD  bumetanide (BUMEX) 2 MG tablet Take 1 tablet (2 mg total) by mouth daily. 10/02/13  Yes Jettie Booze, MD  busPIRone (BUSPAR) 5 MG tablet Take 7.5 mg by mouth 2 (two) times daily.    Yes Historical Provider, MD  Canagliflozin (INVOKANA) 100 MG TABS Take 100 mg by mouth daily.    Yes Historical Provider, MD  cholestyramine  Lucrezia Starch) 4 G packet Take 1 packet by mouth as needed. For loose stools, IBS.   Yes Historical Provider, MD  clotrimazole (LOTRIMIN) 1 % cream Apply 1 application topically daily. To stomach   Yes Historical Provider, MD  colchicine 0.6 MG tablet Take 0.6 mg by mouth daily.   Yes Historical Provider, MD  DULoxetine (CYMBALTA) 30 MG capsule Take 1 capsule (30 mg total) by mouth 2 (two) times daily. 06/09/13  Yes Alda Berthold, MD  ergocalciferol (VITAMIN D2) 50000 UNITS capsule Take 50,000 Units by mouth once a week. Take on Fridays   Yes Historical Provider, MD  Febuxostat (ULORIC) 80 MG TABS Take 80 mg by mouth at bedtime.    Yes Historical Provider, MD  fesoterodine (TOVIAZ) 4 MG TB24 Take 4 mg by mouth daily.     Yes Historical Provider, MD  fish oil-omega-3 fatty acids 1000 MG capsule Take 1 g by mouth 2 (two) times daily.    Yes Historical Provider, MD  gabapentin (NEURONTIN) 100 MG capsule Take one tablet in the morning, 2 tablets in the afternoon, and 2 tablets at bedtime 06/09/13  Yes Alda Berthold, MD  guaiFENesin (MUCINEX) 600 MG 12 hr tablet Take 600 mg by mouth 2 (two) times daily.    Yes Historical Provider, MD  HYDROcodone-acetaminophen (NORCO/VICODIN) 5-325 MG per tablet Take 1 tablet by mouth every 6 (six) hours as needed for moderate pain.    Yes Historical Provider, MD  insulin regular human CONCENTRATED (HUMULIN R) 500 UNIT/ML SOLN injection Inject 28 Units into the skin 2 (two) times daily before a meal.    Yes Historical Provider, MD  ipratropium (ATROVENT) 0.02 % nebulizer solution Take 0.5 mg by nebulization 4 (four) times daily as needed for wheezing or shortness of breath.   Yes Historical Provider, MD  Liraglutide (VICTOZA) 18 MG/3ML SOLN injection Inject 1.8 mg into the skin daily.   Yes Historical Provider, MD  loratadine (CLARITIN) 10 MG tablet Take 10 mg by mouth daily as needed. For allergies   Yes Historical Provider, MD  losartan (COZAAR) 100 MG tablet Take 1 tablet  (132m) by mouth daily. 10/02/13  Yes JJettie Booze MD  meclizine (ANTIVERT) 25 MG tablet Take 25 mg by mouth at bedtime.    Yes Historical Provider, MD  metolazone (ZAROXOLYN) 5 MG tablet Take 1 tablet (5 mg total) by mouth See admin instructions. 3 times weekly as needed for edema 08/05/13  Yes JJettie Booze MD  Multiple Vitamin (MULTIVITAMIN) capsule Take 1 capsule by mouth daily.     Yes Historical Provider, MD  nebivolol (BYSTOLIC) 10 MG tablet Take 10 mg by mouth daily.   Yes Historical Provider, MD  omeprazole (PRILOSEC) 40 MG capsule Take 40 mg by mouth 2 (two) times daily.    Yes Historical Provider, MD  potassium chloride SA (K-DUR,KLOR-CON) 20 MEQ tablet Take 40 mEq by mouth 3 (three) times daily.    Yes Historical Provider, MD  primidone (MYSOLINE) 50 MG tablet Take one tablet twice daily 06/09/13  Yes DAlda Berthold MD  Probiotic Product (PROBIOTIC PO) Take 1 tablet by mouth 2 (two) times daily.   Yes Historical Provider, MD  promethazine (PHENERGAN) 25 MG tablet Take 25 mg by mouth every 6 (six) hours as needed. For nausea/vomiting   Yes Historical Provider, MD  warfarin (COUMADIN) 5 MG tablet Take 5 mg by mouth daily.    Yes Historical Provider, MD   BP 134/64  Pulse 72  Temp(Src) 99.1 F (37.3 C) (Oral)  Resp 15  Ht _0  (1.727 m)  Wt 413 lb (187.336 kg)  BMI 62.81 kg/m2  SpO2 96%  Physical Exam  Nursing note and vitals reviewed. Constitutional: She is oriented to person, place, and time. She appears well-developed and well-nourished. No distress.  Nontoxic/nonseptic appearing  HENT:  Head: Normocephalic and atraumatic.  Eyes: Conjunctivae and EOM are normal. No scleral icterus.  Neck: Normal range of motion. Neck supple.  Trach in place.  Cardiovascular: Normal rate, regular rhythm and intact distal pulses.   Pulmonary/Chest: Effort normal and breath sounds normal. No respiratory distress. She has no wheezes. She has no rales.  Chest expansion symmetric   Abdominal: Soft. There is tenderness (TTP in  RLQ). There is no rebound and no guarding.  Super morbidly obese abdomen with tenderness to palpation in the right lower cautery. Exam limited secondary to body habitus. No peritoneal signs appreciated.  Musculoskeletal: Normal range of motion.  Neurological: She is alert and oriented to person, place, and time.  GCS 15. Patient moves extremities without ataxia.  Skin: Skin is warm and dry. No rash noted. She is not diaphoretic. No erythema. No pallor.  Psychiatric: She has a normal mood and affect. Her behavior is normal.    ED Course  Procedures (including critical care time) Labs Review Labs Reviewed  CBC WITH DIFFERENTIAL - Abnormal; Notable for the following:    Platelets 117 (*)    Monocytes Relative 15 (*)    All other components within normal limits  URINALYSIS, ROUTINE W REFLEX MICROSCOPIC - Abnormal; Notable for the following:    Glucose, UA >1000 (*)    All other components within normal limits  URINE MICROSCOPIC-ADD ON - Abnormal; Notable for the following:    Bacteria, UA FEW (*)    All other components within normal limits  I-STAT CHEM 8, ED - Abnormal; Notable for the following:    Potassium 3.1 (*)    Glucose, Bld 200 (*)    Calcium, Ion 1.10 (*)    All other components within normal limits    Imaging Review Ct Abdomen Pelvis W Contrast  12/30/2013   CLINICAL DATA:  Abdominal pain in the right lower quadrant for 2 days. Nausea. Tender upon palpation. Constipation.  EXAM: CT ABDOMEN AND PELVIS WITH CONTRAST  TECHNIQUE: Multidetector CT imaging of the abdomen and pelvis was performed using the standard protocol following bolus administration of intravenous contrast.  CONTRAST:  100 mL Omnipaque 300  COMPARISON:  11/12/2013  FINDINGS: Probable dependent changes in the lung bases.  Surgical absence of the gallbladder. The liver, spleen, pancreas, adrenal glands, kidneys, abdominal aorta, inferior vena cava, and retroperitoneal  lymph nodes are unremarkable. Stomach, small bowel, and colon are not abnormally distended. No free air or free fluid in the abdomen. Laxity of the and abdominal wall musculature without hernia. Prominent visceral adipose tissues.  Pelvis: Uterus and ovaries are not enlarged. Bladder wall is not thickened. No free or loculated pelvic fluid collections. No evidence of diverticulitis. Appendix is normal. Degenerative changes in the lumbar spine. No destructive bone lesions.  IMPRESSION: No acute process demonstrated in the abdomen or pelvis. Appendix is normal.   Electronically Signed   By: Lucienne Capers [name]   On: 12/30/2013 04:32     EKG Interpretation None      MDM   Final diagnoses:  Right lower quadrant abdominal pain  Thrombocytopenia  Hypokalemia    Patient presents to the emergency department for right lower quadrant abdominal pain. Patient super morbidly obese with tenderness to palpation in her right lower quadrant. No peritoneal signs appreciated, but exam limited secondary to body habitus. Labs today revealed no leukocytosis, anemia, or electrolyte abnormalities; hypokalemia c/w baseline. Anion gap 13. Thrombocytopenia also at baseline. Kidney function preserved. UA negative for infection.  CT abdomen and pelvis ordered for further evaluation of symptoms which shows no evidence of acute abdominopelvic process. No evidence of appendicitis. Pain treated in ED with fentanyl with some improvement. Believe further evaluation of symptoms may be completed as an outpatient. Will manage pain with Percocet and manage nausea with Zofran. Return precautions provided and patient agreeable to plan with no unaddressed concerns.   Filed Vitals:   12/30/13 0430  12/30/13 0445 12/30/13 0500 12/30/13 0515  BP: 115/98 132/60 136/77 119/59  Pulse:  84 89 95  Temp:      TempSrc:      Resp:  _0 Height:      Weight:      SpO2:  96% 95% 93%     [name], PA-C 01/05/14 2035

## 2014-01-01 LAB — AFB CULTURE WITH SMEAR (NOT AT ARMC): Acid Fast Smear: NONE SEEN

## 2014-01-05 NOTE — ED Provider Notes (Signed)
Medical screening examination/treatment/procedure(s) were performed by non-physician practitioner and as supervising physician I was immediately available for consultation/collaboration.   EKG Interpretation None       Sloane Junkin K Etha Stambaugh-Rasch, MD 01/05/14 2306

## 2014-02-06 ENCOUNTER — Encounter (HOSPITAL_COMMUNITY): Payer: Self-pay | Admitting: Emergency Medicine

## 2014-02-06 ENCOUNTER — Emergency Department (HOSPITAL_COMMUNITY): Payer: Medicare Other

## 2014-02-06 ENCOUNTER — Observation Stay (HOSPITAL_COMMUNITY)
Admission: EM | Admit: 2014-02-06 | Discharge: 2014-02-08 | Disposition: A | Discharge status: Home / Self Care | Payer: Medicare Other | Attending: Internal Medicine | Admitting: Internal Medicine

## 2014-02-06 DIAGNOSIS — R161 Splenomegaly, not elsewhere classified: Secondary | ICD-10-CM | POA: Diagnosis not present

## 2014-02-06 DIAGNOSIS — N179 Acute kidney failure, unspecified: Secondary | ICD-10-CM | POA: Insufficient documentation

## 2014-02-06 DIAGNOSIS — Z9911 Dependence on respirator [ventilator] status: Secondary | ICD-10-CM | POA: Diagnosis not present

## 2014-02-06 DIAGNOSIS — J45909 Unspecified asthma, uncomplicated: Secondary | ICD-10-CM | POA: Diagnosis not present

## 2014-02-06 DIAGNOSIS — G4733 Obstructive sleep apnea (adult) (pediatric): Secondary | ICD-10-CM | POA: Insufficient documentation

## 2014-02-06 DIAGNOSIS — E872 Acidosis, unspecified: Secondary | ICD-10-CM | POA: Insufficient documentation

## 2014-02-06 DIAGNOSIS — L0291 Cutaneous abscess, unspecified: Secondary | ICD-10-CM

## 2014-02-06 DIAGNOSIS — M109 Gout, unspecified: Secondary | ICD-10-CM | POA: Insufficient documentation

## 2014-02-06 DIAGNOSIS — Z7901 Long term (current) use of anticoagulants: Secondary | ICD-10-CM | POA: Diagnosis not present

## 2014-02-06 DIAGNOSIS — S92911B Unspecified fracture of right toe(s), initial encounter for open fracture: Secondary | ICD-10-CM

## 2014-02-06 DIAGNOSIS — F3289 Other specified depressive episodes: Secondary | ICD-10-CM | POA: Insufficient documentation

## 2014-02-06 DIAGNOSIS — E119 Type 2 diabetes mellitus without complications: Secondary | ICD-10-CM | POA: Diagnosis present

## 2014-02-06 DIAGNOSIS — X58XXXA Exposure to other specified factors, initial encounter: Secondary | ICD-10-CM | POA: Insufficient documentation

## 2014-02-06 DIAGNOSIS — I5032 Chronic diastolic (congestive) heart failure: Secondary | ICD-10-CM | POA: Diagnosis present

## 2014-02-06 DIAGNOSIS — K219 Gastro-esophageal reflux disease without esophagitis: Secondary | ICD-10-CM | POA: Diagnosis not present

## 2014-02-06 DIAGNOSIS — D696 Thrombocytopenia, unspecified: Secondary | ICD-10-CM | POA: Diagnosis not present

## 2014-02-06 DIAGNOSIS — Z881 Allergy status to other antibiotic agents status: Secondary | ICD-10-CM | POA: Insufficient documentation

## 2014-02-06 DIAGNOSIS — Z882 Allergy status to sulfonamides status: Secondary | ICD-10-CM | POA: Diagnosis not present

## 2014-02-06 DIAGNOSIS — R1319 Other dysphagia: Secondary | ICD-10-CM

## 2014-02-06 DIAGNOSIS — F329 Major depressive disorder, single episode, unspecified: Secondary | ICD-10-CM | POA: Insufficient documentation

## 2014-02-06 DIAGNOSIS — Z885 Allergy status to narcotic agent status: Secondary | ICD-10-CM | POA: Insufficient documentation

## 2014-02-06 DIAGNOSIS — R002 Palpitations: Secondary | ICD-10-CM | POA: Diagnosis not present

## 2014-02-06 DIAGNOSIS — E876 Hypokalemia: Secondary | ICD-10-CM

## 2014-02-06 DIAGNOSIS — E669 Obesity, unspecified: Secondary | ICD-10-CM

## 2014-02-06 DIAGNOSIS — E1149 Type 2 diabetes mellitus with other diabetic neurological complication: Secondary | ICD-10-CM | POA: Diagnosis not present

## 2014-02-06 DIAGNOSIS — N3941 Urge incontinence: Secondary | ICD-10-CM

## 2014-02-06 DIAGNOSIS — E662 Morbid (severe) obesity with alveolar hypoventilation: Secondary | ICD-10-CM | POA: Insufficient documentation

## 2014-02-06 DIAGNOSIS — E1142 Type 2 diabetes mellitus with diabetic polyneuropathy: Secondary | ICD-10-CM | POA: Diagnosis not present

## 2014-02-06 DIAGNOSIS — I10 Essential (primary) hypertension: Secondary | ICD-10-CM

## 2014-02-06 DIAGNOSIS — S68118A Complete traumatic metacarpophalangeal amputation of other finger, initial encounter: Secondary | ICD-10-CM | POA: Insufficient documentation

## 2014-02-06 DIAGNOSIS — R197 Diarrhea, unspecified: Secondary | ICD-10-CM

## 2014-02-06 DIAGNOSIS — D72829 Elevated white blood cell count, unspecified: Secondary | ICD-10-CM

## 2014-02-06 DIAGNOSIS — I509 Heart failure, unspecified: Secondary | ICD-10-CM | POA: Diagnosis not present

## 2014-02-06 DIAGNOSIS — J309 Allergic rhinitis, unspecified: Secondary | ICD-10-CM

## 2014-02-06 DIAGNOSIS — Z6841 Body Mass Index (BMI) 40.0 and over, adult: Secondary | ICD-10-CM | POA: Insufficient documentation

## 2014-02-06 DIAGNOSIS — E785 Hyperlipidemia, unspecified: Secondary | ICD-10-CM | POA: Diagnosis not present

## 2014-02-06 DIAGNOSIS — Z888 Allergy status to other drugs, medicaments and biological substances status: Secondary | ICD-10-CM | POA: Insufficient documentation

## 2014-02-06 DIAGNOSIS — Z930 Tracheostomy status: Secondary | ICD-10-CM

## 2014-02-06 DIAGNOSIS — D62 Acute posthemorrhagic anemia: Secondary | ICD-10-CM | POA: Diagnosis not present

## 2014-02-06 DIAGNOSIS — E1169 Type 2 diabetes mellitus with other specified complication: Secondary | ICD-10-CM | POA: Diagnosis present

## 2014-02-06 DIAGNOSIS — F411 Generalized anxiety disorder: Secondary | ICD-10-CM | POA: Insufficient documentation

## 2014-02-06 DIAGNOSIS — E782 Mixed hyperlipidemia: Secondary | ICD-10-CM

## 2014-02-06 DIAGNOSIS — M8660 Other chronic osteomyelitis, unspecified site: Secondary | ICD-10-CM

## 2014-02-06 DIAGNOSIS — S92919B Unspecified fracture of unspecified toe(s), initial encounter for open fracture: Principal | ICD-10-CM | POA: Diagnosis present

## 2014-02-06 DIAGNOSIS — R042 Hemoptysis: Secondary | ICD-10-CM

## 2014-02-06 DIAGNOSIS — E678 Other specified hyperalimentation: Secondary | ICD-10-CM | POA: Diagnosis present

## 2014-02-06 DIAGNOSIS — L0390 Cellulitis, unspecified: Secondary | ICD-10-CM

## 2014-02-06 DIAGNOSIS — Z8619 Personal history of other infectious and parasitic diseases: Secondary | ICD-10-CM

## 2014-02-06 DIAGNOSIS — K7689 Other specified diseases of liver: Secondary | ICD-10-CM

## 2014-02-06 LAB — BASIC METABOLIC PANEL
Anion gap: 16 — ABNORMAL HIGH (ref 5–15)
BUN: 23 mg/dL (ref 6–23)
CO2: 29 meq/L (ref 19–32)
Calcium: 8.5 mg/dL (ref 8.4–10.5)
Chloride: 94 mEq/L — ABNORMAL LOW (ref 96–112)
Creatinine, Ser: 1.18 mg/dL — ABNORMAL HIGH (ref 0.50–1.10)
GFR calc Af Amer: 61 mL/min — ABNORMAL LOW (ref >90)
GFR calc non Af Amer: 52 mL/min — ABNORMAL LOW (ref >90)
GLUCOSE: 106 mg/dL — AB (ref 70–99)
POTASSIUM: 3.1 meq/L — AB (ref 3.7–5.3)
SODIUM: 139 meq/L (ref 137–147)

## 2014-02-06 LAB — CBC WITH DIFFERENTIAL/PLATELET
Basophils Absolute: 0 10*3/uL (ref 0.0–0.1)
Basophils Relative: 1 % (ref 0–1)
Eosinophils Absolute: 0.1 10*3/uL (ref 0.0–0.7)
Eosinophils Relative: 2 % (ref 0–5)
HEMATOCRIT: 39.5 % (ref 36.0–46.0)
Hemoglobin: 13.1 g/dL (ref 12.0–15.0)
LYMPHS PCT: 36 % (ref 12–46)
Lymphs Abs: 2 10*3/uL (ref 0.7–4.0)
MCH: 30.7 pg (ref 26.0–34.0)
MCHC: 33.2 g/dL (ref 30.0–36.0)
MCV: 92.5 fL (ref 78.0–100.0)
MONOS PCT: 20 % — AB (ref 3–12)
Monocytes Absolute: 1.1 10*3/uL — ABNORMAL HIGH (ref 0.1–1.0)
NEUTROS ABS: 2.3 10*3/uL (ref 1.7–7.7)
Neutrophils Relative %: 42 % — ABNORMAL LOW (ref 43–77)
Platelets: 116 10*3/uL — ABNORMAL LOW (ref 150–400)
RBC: 4.27 MIL/uL (ref 3.87–5.11)
RDW: 15 % (ref 11.5–15.5)
WBC: 5.6 10*3/uL (ref 4.0–10.5)

## 2014-02-06 LAB — PROTIME-INR
INR: 3.23 — ABNORMAL HIGH (ref 0.00–1.49)
Prothrombin Time: 33 seconds — ABNORMAL HIGH (ref 11.6–15.2)

## 2014-02-06 LAB — GLUCOSE, CAPILLARY: Glucose-Capillary: 178 mg/dL — ABNORMAL HIGH (ref 70–99)

## 2014-02-06 MED ORDER — FEBUXOSTAT 40 MG PO TABS
80.0000 mg | ORAL_TABLET | Freq: Every day | ORAL | Status: DC
  Administered 2014-02-07: 80 mg via ORAL
  Filled 2014-02-06 (×2): qty 2

## 2014-02-06 MED ORDER — MECLIZINE HCL 25 MG PO TABS
25.0000 mg | ORAL_TABLET | Freq: Every day | ORAL | Status: DC
  Administered 2014-02-07 (×2): 25 mg via ORAL
  Filled 2014-02-06 (×3): qty 1

## 2014-02-06 MED ORDER — DEXTROSE 5 % IV SOLN
3.0000 g | Freq: Once | INTRAVENOUS | Status: AC
  Administered 2014-02-06: 3 g via INTRAVENOUS
  Filled 2014-02-06 (×2): qty 3000

## 2014-02-06 MED ORDER — CHLORHEXIDINE GLUCONATE 4 % EX LIQD
60.0000 mL | Freq: Once | CUTANEOUS | Status: AC
  Administered 2014-02-07: 4 via TOPICAL
  Filled 2014-02-06: qty 60

## 2014-02-06 MED ORDER — ACETAMINOPHEN 325 MG PO TABS
650.0000 mg | ORAL_TABLET | Freq: Four times a day (QID) | ORAL | Status: DC | PRN
  Administered 2014-02-07: 650 mg via ORAL
  Filled 2014-02-06: qty 2

## 2014-02-06 MED ORDER — INSULIN ASPART 100 UNIT/ML ~~LOC~~ SOLN
0.0000 [IU] | Freq: Every day | SUBCUTANEOUS | Status: DC
  Administered 2014-02-07: 3 [IU] via SUBCUTANEOUS

## 2014-02-06 MED ORDER — POTASSIUM CHLORIDE CRYS ER 20 MEQ PO TBCR
40.0000 meq | EXTENDED_RELEASE_TABLET | Freq: Once | ORAL | Status: AC
  Administered 2014-02-07: 40 meq via ORAL
  Filled 2014-02-06: qty 2

## 2014-02-06 MED ORDER — ACETAMINOPHEN 650 MG RE SUPP: 650.0000 mg | Freq: Four times a day (QID) | RECTAL | Status: DC | PRN

## 2014-02-06 MED ORDER — IPRATROPIUM BROMIDE 0.02 % IN SOLN: 0.5000 mg | Freq: Four times a day (QID) | RESPIRATORY_TRACT | Status: DC | PRN

## 2014-02-06 MED ORDER — ALBUTEROL SULFATE (2.5 MG/3ML) 0.083% IN NEBU: 2.5000 mg | INHALATION_SOLUTION | RESPIRATORY_TRACT | Status: DC | PRN

## 2014-02-06 MED ORDER — ATORVASTATIN CALCIUM 10 MG PO TABS
10.0000 mg | ORAL_TABLET | Freq: Every day | ORAL | Status: DC
  Administered 2014-02-07 (×2): 10 mg via ORAL
  Filled 2014-02-06 (×3): qty 1

## 2014-02-06 MED ORDER — GABAPENTIN 100 MG PO CAPS
100.0000 mg | ORAL_CAPSULE | Freq: Every day | ORAL | Status: DC
  Administered 2014-02-07 – 2014-02-08 (×2): 100 mg via ORAL
  Filled 2014-02-06 (×2): qty 1

## 2014-02-06 MED ORDER — FESOTERODINE FUMARATE ER 4 MG PO TB24
4.0000 mg | ORAL_TABLET | Freq: Every day | ORAL | Status: DC
  Administered 2014-02-07 – 2014-02-08 (×2): 4 mg via ORAL
  Filled 2014-02-06 (×2): qty 1

## 2014-02-06 MED ORDER — BUSPIRONE HCL 15 MG PO TABS
7.5000 mg | ORAL_TABLET | Freq: Two times a day (BID) | ORAL | Status: DC
  Administered 2014-02-07 – 2014-02-08 (×4): 7.5 mg via ORAL
  Filled 2014-02-06 (×6): qty 1

## 2014-02-06 MED ORDER — COLCHICINE 0.6 MG PO TABS
0.6000 mg | ORAL_TABLET | Freq: Every day | ORAL | Status: DC
  Administered 2014-02-07 – 2014-02-08 (×2): 0.6 mg via ORAL
  Filled 2014-02-06 (×2): qty 1

## 2014-02-06 MED ORDER — KCL IN DEXTROSE-NACL 40-5-0.45 MEQ/L-%-% IV SOLN
INTRAVENOUS | Status: DC
  Administered 2014-02-07: 02:00:00 via INTRAVENOUS
  Filled 2014-02-06 (×3): qty 1000

## 2014-02-06 MED ORDER — PANTOPRAZOLE SODIUM 40 MG PO TBEC: 40.0000 mg | DELAYED_RELEASE_TABLET | Freq: Every day | ORAL | Status: DC

## 2014-02-06 MED ORDER — DULOXETINE HCL 30 MG PO CPEP
30.0000 mg | ORAL_CAPSULE | Freq: Two times a day (BID) | ORAL | Status: DC
  Administered 2014-02-07 – 2014-02-08 (×4): 30 mg via ORAL
  Filled 2014-02-06 (×5): qty 1

## 2014-02-06 MED ORDER — ALPRAZOLAM 0.5 MG PO TABS
0.5000 mg | ORAL_TABLET | Freq: Two times a day (BID) | ORAL | Status: DC | PRN
  Administered 2014-02-07: 0.5 mg via ORAL
  Filled 2014-02-06: qty 1

## 2014-02-06 MED ORDER — INSULIN ASPART 100 UNIT/ML ~~LOC~~ SOLN
0.0000 [IU] | Freq: Three times a day (TID) | SUBCUTANEOUS | Status: DC
  Administered 2014-02-07 (×3): 3 [IU] via SUBCUTANEOUS
  Administered 2014-02-08: 5 [IU] via SUBCUTANEOUS
  Administered 2014-02-08: 3 [IU] via SUBCUTANEOUS
  Administered 2014-02-08: 5 [IU] via SUBCUTANEOUS

## 2014-02-06 MED ORDER — BUDESONIDE 0.25 MG/2ML IN SUSP
0.2500 mg | Freq: Two times a day (BID) | RESPIRATORY_TRACT | Status: DC | PRN
  Filled 2014-02-06: qty 2

## 2014-02-06 MED ORDER — GABAPENTIN 100 MG PO CAPS
200.0000 mg | ORAL_CAPSULE | Freq: Every day | ORAL | Status: DC
  Administered 2014-02-07 – 2014-02-08 (×2): 200 mg via ORAL
  Filled 2014-02-06 (×2): qty 2

## 2014-02-06 MED ORDER — PRIMIDONE 50 MG PO TABS
50.0000 mg | ORAL_TABLET | Freq: Two times a day (BID) | ORAL | Status: DC
  Administered 2014-02-07 – 2014-02-08 (×4): 50 mg via ORAL
  Filled 2014-02-06 (×5): qty 1

## 2014-02-06 MED ORDER — GABAPENTIN 100 MG PO CAPS
200.0000 mg | ORAL_CAPSULE | Freq: Every day | ORAL | Status: DC
  Administered 2014-02-07 (×2): 200 mg via ORAL
  Filled 2014-02-06 (×3): qty 2

## 2014-02-06 NOTE — ED Notes (Signed)
Pressure dressing placed over right great toe per EDP verbal orders.

## 2014-02-06 NOTE — ED Provider Notes (Signed)
CSN: [id number]     Arrival date & time 02/06/14  1718 History   First MD Initiated Contact with Patient 02/06/14 1735     Chief Complaint  Patient presents with  . Toe Injury     (Consider location/radiation/quality/duration/timing/severity/associated sxs/prior Treatment) HPI Comments: Patient is a 53-year-old female with history of diabetes, peripheral neuropathy, dyslipidemia, urinary incontinence, CHF, hyperlipidemia, chronic kidney disease, DVT who presents to the emergency department after stubbing her toe on the carpet. She reports that she was waking up from a nap when "her toe but caught up under her". She had a significant amount of bleeding. She is on Coumadin and last had her INR checked approximately a month and a half ago. She does not have feeling in her feet secondary to diabetic peripheral neuropathy. She has minimal throbbing pain. She otherwise feels well.   The history is provided by the patient. No language interpreter was used.    Past Medical History  Diagnosis Date  . OSA (obstructive sleep apnea)   . Morbid obesity   . Depressive disorder, not elsewhere classified   . Diabetes mellitus   . Asthma   . Dyslipidemia   . Clostridium difficile enterocolitis 2012  . Splenomegaly 06/01/2011  . Thrombocytopenia 06/01/2011  . Hepatosplenomegaly   . Gout   . GERD (gastroesophageal reflux disease)   . Osteoarthritis   . Polyneuropathy   . Urinary incontinence   . Amenorrhea   . CHF (congestive heart failure)   . Hyperlipidemia   . Leg swelling   . Nausea   . Weakness   . Palpitations   . PONV (postoperative nausea and vomiting)   . Anginal pain     no chest pain in years  . Dysrhythmia     heart palpations  . Anxiety   . Pneumonia   . Shortness of breath   . Chronic kidney disease     overactive bladder  . Peripheral neuropathy   . DVT (deep venous thrombosis)    Past Surgical History  Procedure Laterality Date  . Cholecystectomy    . Tracheostomy   2001  . Endometrial ablation    . Skin graft    . Finger surgery      ring finger on right hand  . Esophageal dilation  2003  . Breast biopsy      needle core, left  . Dilation and curettage of uterus      times 2  . Tracheal dilitation  01/24/2012    Procedure: TRACHEAL DILITATION;  Surgeon: John Byers, MD;  Location: [facility] OR;  Service: ENT;  Laterality: N/A;  Trache change with possible dilation, from size 6 to size 7 uncuffed  . Amputation Right 11/19/2013    Procedure: AMPUTATION DIGIT;  Surgeon: Robert Sypher Jr V, MD;  Location: [facility];  Service: Orthopedics;  Laterality: Right;  Partial amputation right long finger, Debridement right ring finger    Family History  Problem Relation Age of Onset  . Diabetes Father   . Hypertension Father   . Dementia Father     Deceased, 77  . Pneumonia Father   . Heart disease Mother   . Clotting disorder Mother   . Hypertension Mother   . Heart attack Mother     Deceased, 70  . Multiple myeloma Paternal Grandmother   . Cancer Paternal Grandmother     multiple myoloma  . Heart disease Paternal Grandfather   . Kidney disease Maternal Grandmother   . Hypertension Sister   .   Cancer Paternal Aunt     breast  . Cancer Cousin     breast - all three   History  Substance Use Topics  . Smoking status: Never Smoker   . Smokeless tobacco: Never Used  . Alcohol Use: No     Comment: occasional - once or twice a year   OB History   Grav Para Term Preterm Abortions TAB SAB Ect Mult Living   0              Review of Systems  Constitutional: Negative for fever and chills.  Respiratory: Negative for shortness of breath.   Cardiovascular: Negative for chest pain.  Gastrointestinal: Negative for nausea, vomiting and abdominal pain.  Musculoskeletal: Positive for gait problem.  Skin: Positive for wound.  All other systems reviewed and are negative.     Allergies  Molds & smuts; Nitroglycerin er; Accolate; Allopurinol;  Amoxicillin-pot clavulanate; Ciprofloxacin; Codeine; Doxycycline; Fexofenadine; Keflex; Morphine; Sulfa drugs cross reactors; and Sulfonamide derivatives  Home Medications   Prior to Admission medications   Medication Sig Start Date End Date Taking? Authorizing Provider  acetaminophen (TYLENOL) 500 MG tablet Take 1,000 mg by mouth every 6 (six) hours as needed for mild pain. For pain   Yes Historical Provider, MD  albuterol (PROVENTIL) (2.5 MG/3ML) 0.083% nebulizer solution Take 2.5 mg by nebulization every 4 (four) hours as needed. For shortness of breath 05/28/11  Yes Vineet Sood, MD  ALPRAZolam (XANAX) 0.5 MG tablet Take 0.5 mg by mouth 2 (two) times daily as needed. For anxiety   Yes Historical Provider, MD  atorvastatin (LIPITOR) 10 MG tablet Take 10 mg by mouth at bedtime.    Yes Historical Provider, MD  B Complex Vitamins (VITAMIN B COMPLEX PO) Take 1 tablet by mouth daily.    Yes Historical Provider, MD  budesonide (PULMICORT) 0.25 MG/2ML nebulizer solution Take 0.25 mg by nebulization 2 (two) times daily as needed (shortness of breath).   Yes Historical Provider, MD  bumetanide (BUMEX) 2 MG tablet Take 1 tablet (2 mg total) by mouth daily. 10/02/13  Yes Jayadeep S Varanasi, MD  busPIRone (BUSPAR) 5 MG tablet Take 7.5 mg by mouth 2 (two) times daily.    Yes Historical Provider, MD  Canagliflozin (INVOKANA) 100 MG TABS Take 100 mg by mouth daily.    Yes Historical Provider, MD  cholestyramine (QUESTRAN) 4 G packet Take 1 packet by mouth as needed. For loose stools, IBS.   Yes Historical Provider, MD  clotrimazole (LOTRIMIN) 1 % cream Apply 1 application topically daily. To stomach   Yes Historical Provider, MD  colchicine 0.6 MG tablet Take 0.6 mg by mouth daily.   Yes Historical Provider, MD  DULoxetine (CYMBALTA) 30 MG capsule Take 1 capsule (30 mg total) by mouth 2 (two) times daily. 06/09/13  Yes Donika K Patel, DO  ergocalciferol (VITAMIN D2) 50000 UNITS capsule Take 50,000 Units by  mouth once a week. Take on Fridays   Yes Historical Provider, MD  Febuxostat (ULORIC) 80 MG TABS Take 80 mg by mouth at bedtime.    Yes Historical Provider, MD  fesoterodine (TOVIAZ) 4 MG TB24 Take 4 mg by mouth daily.     Yes Historical Provider, MD  fish oil-omega-3 fatty acids 1000 MG capsule Take 1 g by mouth 2 (two) times daily.    Yes Historical Provider, MD  gabapentin (NEURONTIN) 100 MG capsule Take 100 mg by mouth See admin instructions. Take one tablet in the morning, 2 tablets in the   afternoon, and 2 tablets at bedtime 06/09/13  Yes Donika K Patel, DO  guaiFENesin (MUCINEX) 600 MG 12 hr tablet Take 600 mg by mouth 2 (two) times daily.    Yes Historical Provider, MD  HYDROcodone-acetaminophen (NORCO/VICODIN) 5-325 MG per tablet Take 1 tablet by mouth every 6 (six) hours as needed for moderate pain.    Yes Historical Provider, MD  insulin regular human CONCENTRATED (HUMULIN R) 500 UNIT/ML SOLN injection Inject 28 Units into the skin 2 (two) times daily before a meal.    Yes Historical Provider, MD  ipratropium (ATROVENT) 0.02 % nebulizer solution Take 0.5 mg by nebulization 4 (four) times daily as needed for wheezing or shortness of breath.   Yes Historical Provider, MD  Liraglutide (VICTOZA) 18 MG/3ML SOLN injection Inject 1.8 mg into the skin daily.   Yes Historical Provider, MD  loratadine (CLARITIN) 10 MG tablet Take 10 mg by mouth daily as needed. For allergies   Yes Historical Provider, MD  losartan (COZAAR) 100 MG tablet Take 1 tablet (170m) by mouth daily. 10/02/13  Yes JJettie Booze MD  meclizine (ANTIVERT) 25 MG tablet Take 25 mg by mouth at bedtime.    Yes Historical Provider, MD  metolazone (ZAROXOLYN) 5 MG tablet Take 1 tablet (5 mg total) by mouth See admin instructions. 3 times weekly as needed for edema 08/05/13  Yes JJettie Booze MD  Multiple Vitamin (MULTIVITAMIN) capsule Take 1 capsule by mouth daily.     Yes Historical Provider, MD  nebivolol (BYSTOLIC) 10 MG  tablet Take 10 mg by mouth daily.   Yes Historical Provider, MD  omeprazole (PRILOSEC) 40 MG capsule Take 40 mg by mouth 2 (two) times daily.    Yes Historical Provider, MD  ondansetron (ZOFRAN) 4 MG tablet Take 4 mg by mouth every 8 (eight) hours as needed for nausea. 12/30/13  Yes KAntonietta Breach PA-C  oxyCODONE-acetaminophen (PERCOCET/ROXICET) 5-325 MG per tablet Take 1-2 tablets by mouth every 6 (six) hours as needed for moderate pain or severe pain. 12/30/13  Yes KAntonietta Breach PA-C  potassium chloride SA (K-DUR,KLOR-CON) 20 MEQ tablet Take 20 mEq by mouth 3 (three) times daily. 1 in AM, 2 at lunch, 1 at supper and 2 at bedtime   Yes Historical Provider, MD  primidone (MYSOLINE) 50 MG tablet Take 50 mg by mouth 2 (two) times daily. Take one tablet twice daily 06/09/13  Yes Donika K Patel, DO  Probiotic Product (PROBIOTIC PO) Take 1 tablet by mouth daily.    Yes Historical Provider, MD  warfarin (COUMADIN) 5 MG tablet Take 5 mg by mouth daily.    Yes Historical Provider, MD   BP 107/54  Pulse 66  Temp(Src) 98 F (36.7 C) (Oral)  Resp 18  Ht 5' 8" (1.727 m)  Wt 413 lb (187.336 kg)  BMI 62.81 kg/m2  SpO2 100% Physical Exam  Nursing note and vitals reviewed. Constitutional: She is oriented to person, place, and time. She appears well-developed and well-nourished. No distress.  Morbidly obese  HENT:  Head: Normocephalic and atraumatic.  Right Ear: External ear normal.  Left Ear: External ear normal.  Nose: Nose normal.  Mouth/Throat: Oropharynx is clear and moist.  Eyes: Conjunctivae are normal.  Neck: Normal range of motion.  Trach in place. No erythema.   Cardiovascular: Normal rate, regular rhythm and normal heart sounds.   Pulmonary/Chest: Effort normal and breath sounds normal. No respiratory distress. She has no wheezes. She has no rales.  Abdominal: Soft. She exhibits  no distension.  Musculoskeletal: Normal range of motion.       Feet:  3 cm laceration to dorsal aspect of right  great toe. Very mobile. Capillary refill < 3 seconds in great toe.   Neurological: She is alert and oriented to person, place, and time. She has normal strength.  Skin: Skin is warm and dry. She is not diaphoretic. No erythema.  Psychiatric: She has a normal mood and affect. Her behavior is normal.    ED Course  Procedures (including critical care time) Labs Review Labs Reviewed  CBC WITH DIFFERENTIAL - Abnormal; Notable for the following:    Platelets 116 (*)    Neutrophils Relative % 42 (*)    Monocytes Relative 20 (*)    Monocytes Absolute 1.1 (*)    All other components within normal limits  PROTIME-INR - Abnormal; Notable for the following:    Prothrombin Time 33.0 (*)    INR 3.23 (*)    All other components within normal limits  BASIC METABOLIC PANEL - Abnormal; Notable for the following:    Potassium 3.1 (*)    Chloride 94 (*)    Glucose, Bld 106 (*)    Creatinine, Ser 1.18 (*)    GFR calc non Af Amer 52 (*)    GFR calc Af Amer 61 (*)    Anion gap 16 (*)    All other components within normal limits  BASIC METABOLIC PANEL - Abnormal; Notable for the following:    Potassium 2.9 (*)    Chloride 94 (*)    Glucose, Bld 191 (*)    Creatinine, Ser 1.13 (*)    Calcium 8.2 (*)    GFR calc non Af Amer 55 (*)    GFR calc Af Amer 64 (*)    All other components within normal limits  GLUCOSE, CAPILLARY - Abnormal; Notable for the following:    Glucose-Capillary 178 (*)    All other components within normal limits  SURGICAL PCR SCREEN  COMPREHENSIVE METABOLIC PANEL  CBC  PROTIME-INR  APTT  BASIC METABOLIC PANEL  BASIC METABOLIC PANEL  BASIC METABOLIC PANEL  BASIC METABOLIC PANEL    Imaging Review Dg Toe Great Right  02/06/2014   CLINICAL DATA:  Injury right great toe.  EXAM: RIGHT GREAT TOE  COMPARISON:  None.  FINDINGS: The patient has a transverse fracture through the proximal aspect of the distal phalanx of the right great toe. The fracture extends to the articular  surface of the IP joint on the lateral side. Bony fragment off of the proximal, lateral corner of the distal phalanx measures 0.6 x 0.4 cm. No other acute bony or joint abnormality is seen. Bandaging is present about the great toe.  IMPRESSION: Mildly comminuted fracture distal phalanx right great toe as described above.   Electronically Signed   By: Thomas  Dalessio [name]   On: 02/06/2014 19:19     EKG Interpretation None      MDM   Final diagnoses:  Open fracture of toe, right, initial encounter    Patient presents to ED with open fracture of right great toe. Discussed this initially with Dr. Duda who recommends 3g of Ancef and discharge home with Keflex due to doxycycline allergy. He recommends nonstick dressing and post op shoe. Patient is very uncomfortable with post op shoe, with high level of concern of fall with this shoe. Patient is morbidly obese, diabetic, and currently has INR of 3.23 with multiple medical problems. BP 103/60. Will admit to   medicine. Admission and consultation appreciated. Vital signs stable at this time. Discussed case with Dr. Thurnell Garbe who agrees with plan. Patient / Family / Caregiver informed of clinical course, understand medical decision-making process, and agree with plan.    Elwyn Lade, PA-C 02/07/14 [phone]

## 2014-02-06 NOTE — Progress Notes (Signed)
Patient arrives to [facility], with 7.5 shil;ey extended length trach with passi muir valve in place. Resp status given in report, may need pulse ox. Room not set up for trach pt. Respiratory therapy notified. [facility] ED charge nurse and 5N charge nurse [facility] aware and at bedside

## 2014-02-06 NOTE — Progress Notes (Signed)
ANTICOAGULATION CONSULT NOTE - Initial Consult  Pharmacy Consult for heparin Indication: VTE hx  Allergies  Allergen Reactions  . Molds & Smuts Shortness Of Breath and Rash  . Nitroglycerin Er Nausea And Vomiting  . Accolate [Zafirlukast] Other (See Comments)    Reaction unknown  . Allopurinol Other (See Comments)    GI problems   . Amoxicillin-Pot Clavulanate     Other reaction(s): Unknown  . Ciprofloxacin Other (See Comments)    GI problems  . Codeine Other (See Comments)    Heart problems  . Doxycycline Other (See Comments)    Gastric problems  . Fexofenadine Other (See Comments)    Hurts joints/pain  . Keflex [Cephalexin] Diarrhea and Nausea Only  . Morphine Other (See Comments)    unknown  . Sulfa Drugs Cross Reactors Nausea And Vomiting    nausea  . Sulfonamide Derivatives Nausea Only    Patient Measurements: Height: 5\' 8"  (172.7 cm) Weight: 402 lb 9 oz (182.6 kg) IBW/kg (Calculated) : 63.9 Heparin Dosing Weight:   Vital Signs: Temp: 98.7 F (37.1 C) (08/15 2242) Temp src: Oral (08/15 2242) BP: 112/53 mmHg (08/15 2242) Pulse Rate: 74 (08/15 2305)  Labs:  Recent Labs  02/06/14 1900  HGB 13.1  HCT 39.5  PLT 116*  LABPROT 33.0*  INR 3.23*  CREATININE 1.18*    Estimated Creatinine Clearance: 99.2 ml/min (by C-G formula based on Cr of 1.18).   Medical History: Past Medical History  Diagnosis Date  . OSA (obstructive sleep apnea)   . Morbid obesity   . Depressive disorder, not elsewhere classified   . Diabetes mellitus   . Asthma   . Dyslipidemia   . Clostridium difficile enterocolitis 2012  . Splenomegaly 06/01/2011  . Thrombocytopenia 06/01/2011  . Hepatosplenomegaly   . Gout   . GERD (gastroesophageal reflux disease)   . Osteoarthritis   . Polyneuropathy   . Urinary incontinence   . Amenorrhea   . CHF (congestive heart failure)   . Hyperlipidemia   . Leg swelling   . Nausea   . Weakness   . Palpitations   . PONV (postoperative  nausea and vomiting)   . Anginal pain     no chest pain in years  . Dysrhythmia     heart palpations  . Anxiety   . Pneumonia   . Shortness of breath   . Chronic kidney disease     overactive bladder  . Peripheral neuropathy   . DVT (deep venous thrombosis)     Medications:  Prescriptions prior to admission  Medication Sig Dispense Refill  . acetaminophen (TYLENOL) 500 MG tablet Take 1,000 mg by mouth every 6 (six) hours as needed for mild pain. For pain      . albuterol (PROVENTIL) (2.5 MG/3ML) 0.083% nebulizer solution Take 2.5 mg by nebulization every 4 (four) hours as needed. For shortness of breath      . ALPRAZolam (XANAX) 0.5 MG tablet Take 0.5 mg by mouth 2 (two) times daily as needed. For anxiety      . atorvastatin (LIPITOR) 10 MG tablet Take 10 mg by mouth at bedtime.       . B Complex Vitamins (VITAMIN B COMPLEX PO) Take 1 tablet by mouth daily.       . budesonide (PULMICORT) 0.25 MG/2ML nebulizer solution Take 0.25 mg by nebulization 2 (two) times daily as needed (shortness of breath).      . bumetanide (BUMEX) 2 MG tablet Take 1 tablet (2 mg total)  by mouth daily.  90 tablet  3  . busPIRone (BUSPAR) 5 MG tablet Take 7.5 mg by mouth 2 (two) times daily.       . Canagliflozin (INVOKANA) 100 MG TABS Take 100 mg by mouth daily.       . cholestyramine (QUESTRAN) 4 G packet Take 1 packet by mouth as needed. For loose stools, IBS.      . clotrimazole (LOTRIMIN) 1 % cream Apply 1 application topically daily. To stomach      . colchicine 0.6 MG tablet Take 0.6 mg by mouth daily.      . DULoxetine (CYMBALTA) 30 MG capsule Take 1 capsule (30 mg total) by mouth 2 (two) times daily.  180 capsule  3  . ergocalciferol (VITAMIN D2) 50000 UNITS capsule Take 50,000 Units by mouth once a week. Take on Fridays      . Febuxostat (ULORIC) 80 MG TABS Take 80 mg by mouth at bedtime.       . fesoterodine (TOVIAZ) 4 MG TB24 Take 4 mg by mouth daily.        . fish oil-omega-3 fatty acids 1000 MG  capsule Take 1 g by mouth 2 (two) times daily.       [name] gabapentin (NEURONTIN) 100 MG capsule Take 100 mg by mouth See admin instructions. Take one tablet in the morning, 2 tablets in the afternoon, and 2 tablets at bedtime      . guaiFENesin (MUCINEX) 600 MG 12 hr tablet Take 600 mg by mouth 2 (two) times daily.       [name] HYDROcodone-acetaminophen (NORCO/VICODIN) 5-325 MG per tablet Take 1 tablet by mouth every 6 (six) hours as needed for moderate pain.       [name] insulin regular human CONCENTRATED (HUMULIN R) 500 UNIT/ML SOLN injection Inject 28 Units into the skin 2 (two) times daily before a meal.       . ipratropium (ATROVENT) 0.02 % nebulizer solution Take 0.5 mg by nebulization 4 (four) times daily as needed for wheezing or shortness of breath.      . Liraglutide (VICTOZA) 18 MG/3ML SOLN injection Inject 1.8 mg into the skin daily.      [name] loratadine (CLARITIN) 10 MG tablet Take 10 mg by mouth daily as needed. For allergies      . losartan (COZAAR) 100 MG tablet Take 1 tablet (100mg ) by mouth daily.  90 tablet  3  . meclizine (ANTIVERT) 25 MG tablet Take 25 mg by mouth at bedtime.       . metolazone (ZAROXOLYN) 5 MG tablet Take 1 tablet (5 mg total) by mouth See admin instructions. 3 times weekly as needed for edema  15 tablet  5  . Multiple Vitamin (MULTIVITAMIN) capsule Take 1 capsule by mouth daily.        . nebivolol (BYSTOLIC) 10 MG tablet Take 10 mg by mouth daily.      [name] omeprazole (PRILOSEC) 40 MG capsule Take 40 mg by mouth 2 (two) times daily.       . ondansetron (ZOFRAN) 4 MG tablet Take 4 mg by mouth every 8 (eight) hours as needed for nausea.      [name] oxyCODONE-acetaminophen (PERCOCET/ROXICET) 5-325 MG per tablet Take 1-2 tablets by mouth every 6 (six) hours as needed for moderate pain or severe pain.  15 tablet  0  . potassium chloride SA (K-DUR,KLOR-CON) 20 MEQ tablet Take 20 mEq by mouth 3 (three) times daily. 1 in AM, 2 at lunch, 1 at  supper and 2 at bedtime      . primidone  (MYSOLINE) 50 MG tablet Take 50 mg by mouth 2 (two) times daily. Take one tablet twice daily      . Probiotic Product (PROBIOTIC PO) Take 1 tablet by mouth daily.       [name] warfarin (COUMADIN) 5 MG tablet Take 5 mg by mouth daily.         Assessment: 53 yo diabetic pt with severe neuropathy and PVD with fracture to right great to possibly requiring surgical intervention in am. Will be reassessed. Heparin for Hx VTE has been ordered however current INR is 3.23 with no vitamin K given at this time. Heparin when INR<2 Goal of Therapy:  Heparin level 0.3-0.7 units/ml when INR <2 Monitor platelets by anticoagulation protocol: Yes   Plan:  Hold Heparin for now until INR <2 +/- vitamin K ?   Curlene Dolphin 02/06/2014,11:51 PM

## 2014-02-06 NOTE — Progress Notes (Signed)
RT called to patient bedside at this time due to new admission being a trach patient. Patient says she has had the trach for 15 years and has a #7 XL shiley with disposable inner cannulas. Patient has already changed the inner cannula for today (she changes it once a day) and has a clean gauze around trach. Patient also has in her PMV from home. I set the patient up a humidity bottle at 28% and 5 lpm per patient home regimen of wearing oxygen at night. RN is to order a spare #7 cuffed trach, spare disposable inner cannulas, an ambu bag, and a continuous pulse ox. RN has also placed extra gauze, suctioning, and trach cleaning kit in patient room. Patient is currently comfortable, in no apparent distress. RT will continue to assist as needed.

## 2014-02-06 NOTE — H&P (Signed)
[facility] History and Physical  ANIHYA TUMA ELF:[id number] DOB: 11-Oct-1960 DOA: 02/06/2014  Referring physician: Cleatrice Burke, PA - [facility] PCP: [name], MD  Specialists: Dr Sharol Given  - Ortho  Chief Complaint: R big toe injury  Assessment/Plan Active Problems:   Open fracture of toe DVT DM w/ peripheral neuropathy and kidney impairment dCHF HLD Depression/anxiety GERD Severe OSA.  AKI Thrombocytopenia Gout   R Great toe open fracture: devastating injury to R great toe. Fortunately vascular structures intact as end of toe w/o evidence of ischemia. 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 Pt likely w/ PVD adn no h/o ABI. Obesity make palpating a pulse very difficult. Discussed case w/ Dr Sharol Given who has agreed to see the patient in the morning as there is no emergent need for surgery a this time.  - Admit to [facility] - f/u Dr Sharol Given recommendations - ABI - Sterile pressure dressings - tylenol PRN pain - amazingly pt not in pain - No surgery indicated at this point per Dr Sharol Given, will reassess need for surgical intervention in the am - Ancef per Dr Eldridge Abrahams [place] acidosis: likely secondary to diabetic ketosis though TypeII and not spilling ketones in urine. Compounded by AKI.  - D5 1/2NS 69mq KCl at 1275mhr - SSI w/ HS coverage - BMET Q4 until gap closes x2.  - Kdur 4024m(anticipate further hypokalemia as gap closes)  AKI: Cr 1.18 on admission. Baseline of 1. Likely multifactorial. home diuretics vs diabetes vs dehydration vs medications (Victoza??) - IVF as above - BMET in am.   DM w/ severe peripheral neuropathy: Hgb A1c 6.3. Humalin 28 BID at home per pt w/ home CBG aroun 150. Pt well controlled to over controlled based on A1c. Avoid hypoglycemia.  Insulin regimen as above.  - Continue home gabapentin adn primidone  Ventilator dependent OSA: trach placed in 2001. Uses nightly humidified ventilator. Likely obesity  hypoventilation most significant factor for this pts OSA - CPAP per Respiratory therapy - Daily trach care - Continue home pulmicort, atrovent, albuterol  DVT: pt supra therapeutic w/ INR 3.23. No evidence of acute bleed outside of injured toe. Plt 116 - Heparin during admission due to possible surgical intervention for toe  Morbid Obesity: 187kg on admission. This is likely the main reason for fracture of toe and many other co-morbidities  - nutrition consult  dCHF: Last Echo 2010 w/ EF 50-58-52%d diastolic failure. No evidence of acute exacerbation.  - continue home diuretics.  - Hold home Bumex, losartan, bystolic in setting of hypotension. Consider restart after IV hydration - continue home lipitor  MSK Pain: chronic pain per pt. Likely from obesity.  - continue home toviaz  Gout: no active or recent flare - contineu colchicine and uloric  Depression/Anxiety: Stable per pt - continue home cymbalta, buspar, xanax  Code Status: FULL Family Communication: No family present during admission  Disposition Plan: pending consult and intervention for toe and resolution of metabolic disorders.    HPI: [name] a [age]o. female came to [facility] 02/06/2014 with  R big toe injury today around 16:00 when getting out of recliner. Toe buckled under pts weight. Did not strike any objects. Non-painful but immediately started to bleed and toe in odd direction. Now throbbing a little per pt. Denies CP, SOB, rash, syncope, LE swelling, lightheadedness. Denies frequency or dysuria.   Insulin - Humalin 28 units BID w/ meals. CBG in the 150. Last  A1c per pt 6.3 09/2013 per pt.    Review of Systems: Per HPI w/ all other systems negative.   Past Medical History  Diagnosis Date  . OSA (obstructive sleep apnea)   . Morbid obesity   . Depressive disorder, not elsewhere classified   . Diabetes mellitus   . Asthma   . Dyslipidemia   . Clostridium difficile enterocolitis 2012  . Splenomegaly  06/01/2011  . Thrombocytopenia 06/01/2011  . Hepatosplenomegaly   . Gout   . GERD (gastroesophageal reflux disease)   . Osteoarthritis   . Polyneuropathy   . Urinary incontinence   . Amenorrhea   . CHF (congestive heart failure)   . Hyperlipidemia   . Leg swelling   . Nausea   . Weakness   . Palpitations   . PONV (postoperative nausea and vomiting)   . Anginal pain     no chest pain in years  . Dysrhythmia     heart palpations  . Anxiety   . Pneumonia   . Shortness of breath   . Chronic kidney disease     overactive bladder  . Peripheral neuropathy   . DVT (deep venous thrombosis)    Past Surgical History  Procedure Laterality Date  . Cholecystectomy    . Tracheostomy  2001  . Endometrial ablation    . Skin graft    . Finger surgery      ring finger on right hand  . Esophageal dilation  2003  . Breast biopsy      needle core, left  . Dilation and curettage of uterus      times 2  . Tracheal dilitation  01/24/2012    Procedure: TRACHEAL DILITATION;  Surgeon: Melissa Montane, MD;  Location: [place];  Service: ENT;  Laterality: N/A;  Trache change with possible dilation, from size 6 to size 7 uncuffed  . Amputation Right 11/19/2013    Procedure: AMPUTATION DIGIT;  Surgeon: Cammie Sickle, MD;  Location: [place];  Service: Orthopedics;  Laterality: Right;  Partial amputation right long finger, Debridement right ring finger    Social History:  History   Social History Narrative   Lives alone.  She has been on disability since 1999 for morbid obesity.    Single, no children.   She has a caregiver that comes 7-days a week, ~5 hours each day.    Allergies  Allergen Reactions  . Molds & Smuts Shortness Of Breath and Rash  . Nitroglycerin Er Nausea And Vomiting  . Accolate [Zafirlukast] Other (See Comments)    Reaction unknown  . Allopurinol Other (See Comments)    GI problems   . Amoxicillin-Pot Clavulanate     Other reaction(s): Unknown  .  Ciprofloxacin Other (See Comments)    GI problems  . Codeine Other (See Comments)    Heart problems  . Doxycycline Other (See Comments)    Gastric problems  . Fexofenadine Other (See Comments)    Hurts joints/pain  . Keflex [Cephalexin] Diarrhea and Nausea Only  . Morphine Other (See Comments)    unknown  . Sulfa Drugs Cross Reactors Nausea And Vomiting    nausea  . Sulfonamide Derivatives Nausea Only    Family History  Problem Relation Age of Onset  . Diabetes Father   . Hypertension Father   . Dementia Father     Deceased, 53  . Pneumonia Father   . Heart disease Mother   . Clotting disorder Mother   . Hypertension  Mother   . Heart attack Mother     Deceased, 30  . Multiple myeloma Paternal Grandmother   . Cancer Paternal Grandmother     multiple myoloma  . Heart disease Paternal Grandfather   . Kidney disease Maternal Grandmother   . Hypertension Sister   . Cancer Paternal Aunt     breast  . Cancer Cousin     breast - all three    Prior to Admission medications   Medication Sig Start Date End Date Taking? Authorizing Provider  acetaminophen (TYLENOL) 500 MG tablet Take 1,000 mg by mouth every 6 (six) hours as needed for mild pain. For pain   Yes Historical Provider, MD  albuterol (PROVENTIL) (2.5 MG/3ML) 0.083% nebulizer solution Take 2.5 mg by nebulization every 4 (four) hours as needed. For shortness of breath 05/28/11  Yes Chesley Mires, MD  ALPRAZolam Duanne Moron) 0.5 MG tablet Take 0.5 mg by mouth 2 (two) times daily as needed. For anxiety   Yes Historical Provider, MD  atorvastatin (LIPITOR) 10 MG tablet Take 10 mg by mouth at bedtime.    Yes Historical Provider, MD  B Complex Vitamins (VITAMIN B COMPLEX PO) Take 1 tablet by mouth daily.    Yes Historical Provider, MD  budesonide (PULMICORT) 0.25 MG/2ML nebulizer solution Take 0.25 mg by nebulization 2 (two) times daily as needed (shortness of breath).   Yes Historical Provider, MD  bumetanide (BUMEX) 2 MG tablet Take  1 tablet (2 mg total) by mouth daily. 10/02/13  Yes Jettie Booze, MD  busPIRone (BUSPAR) 5 MG tablet Take 7.5 mg by mouth 2 (two) times daily.    Yes Historical Provider, MD  Canagliflozin (INVOKANA) 100 MG TABS Take 100 mg by mouth daily.    Yes Historical Provider, MD  cholestyramine Lucrezia Starch) 4 G packet Take 1 packet by mouth as needed. For loose stools, IBS.   Yes Historical Provider, MD  clotrimazole (LOTRIMIN) 1 % cream Apply 1 application topically daily. To stomach   Yes Historical Provider, MD  colchicine 0.6 MG tablet Take 0.6 mg by mouth daily.   Yes Historical Provider, MD  DULoxetine (CYMBALTA) 30 MG capsule Take 1 capsule (30 mg total) by mouth 2 (two) times daily. 06/09/13  Yes Donika K Patel, DO  ergocalciferol (VITAMIN D2) 50000 UNITS capsule Take 50,000 Units by mouth once a week. Take on Fridays   Yes Historical Provider, MD  Febuxostat (ULORIC) 80 MG TABS Take 80 mg by mouth at bedtime.    Yes Historical Provider, MD  fesoterodine (TOVIAZ) 4 MG TB24 Take 4 mg by mouth daily.     Yes Historical Provider, MD  fish oil-omega-3 fatty acids 1000 MG capsule Take 1 g by mouth 2 (two) times daily.    Yes Historical Provider, MD  gabapentin (NEURONTIN) 100 MG capsule Take 100 mg by mouth See admin instructions. Take one tablet in the morning, 2 tablets in the afternoon, and 2 tablets at bedtime 06/09/13  Yes Donika K Patel, DO  guaiFENesin (MUCINEX) 600 MG 12 hr tablet Take 600 mg by mouth 2 (two) times daily.    Yes Historical Provider, MD  HYDROcodone-acetaminophen (NORCO/VICODIN) 5-325 MG per tablet Take 1 tablet by mouth every 6 (six) hours as needed for moderate pain.    Yes Historical Provider, MD  insulin regular human CONCENTRATED (HUMULIN R) 500 UNIT/ML SOLN injection Inject 28 Units into the skin 2 (two) times daily before a meal.    Yes Historical Provider, MD  ipratropium (ATROVENT) 0.02 %  nebulizer solution Take 0.5 mg by nebulization 4 (four) times daily as needed for  wheezing or shortness of breath.   Yes Historical Provider, MD  Liraglutide (VICTOZA) 18 MG/3ML SOLN injection Inject 1.8 mg into the skin daily.   Yes Historical Provider, MD  loratadine (CLARITIN) 10 MG tablet Take 10 mg by mouth daily as needed. For allergies   Yes Historical Provider, MD  losartan (COZAAR) 100 MG tablet Take 1 tablet (132m) by mouth daily. 10/02/13  Yes JJettie Booze MD  meclizine (ANTIVERT) 25 MG tablet Take 25 mg by mouth at bedtime.    Yes Historical Provider, MD  metolazone (ZAROXOLYN) 5 MG tablet Take 1 tablet (5 mg total) by mouth See admin instructions. 3 times weekly as needed for edema 08/05/13  Yes JJettie Booze MD  Multiple Vitamin (MULTIVITAMIN) capsule Take 1 capsule by mouth daily.     Yes Historical Provider, MD  nebivolol (BYSTOLIC) 10 MG tablet Take 10 mg by mouth daily.   Yes Historical Provider, MD  omeprazole (PRILOSEC) 40 MG capsule Take 40 mg by mouth 2 (two) times daily.    Yes Historical Provider, MD  ondansetron (ZOFRAN) 4 MG tablet Take 4 mg by mouth every 8 (eight) hours as needed for nausea. 12/30/13  Yes KAntonietta Breach PA-C  oxyCODONE-acetaminophen (PERCOCET/ROXICET) 5-325 MG per tablet Take 1-2 tablets by mouth every 6 (six) hours as needed for moderate pain or severe pain. 12/30/13  Yes KAntonietta Breach PA-C  potassium chloride SA (K-DUR,KLOR-CON) 20 MEQ tablet Take 20 mEq by mouth 3 (three) times daily. 1 in AM, 2 at lunch, 1 at supper and 2 at bedtime   Yes Historical Provider, MD  primidone (MYSOLINE) 50 MG tablet Take 50 mg by mouth 2 (two) times daily. Take one tablet twice daily 06/09/13  Yes Donika K Patel, DO  Probiotic Product (PROBIOTIC PO) Take 1 tablet by mouth daily.    Yes Historical Provider, MD  warfarin (COUMADIN) 5 MG tablet Take 5 mg by mouth daily.    Yes Historical Provider, MD   Physical Exam: Filed Vitals:   02/06/14 1731 02/06/14 1815 02/06/14 1845 02/06/14 1919  BP: 107/54 112/52 125/60 104/49  Pulse:  66 64 68   Temp:      TempSrc:      Resp:      Height:      Weight:      SpO2:  100% 100% 100%     General:  Morbidly obese  Eyes: EOMI, PERRL  ENT: dry mm,   Neck: Trach in place, nml ROM  Cardiovascular: RRR, no m/r/g  Respiratory: CTAB, nml effort  Abdomen: soft. Non-ttp  Skin: no rash, <2 sec cap refill  Musculoskeletal: R great toe completely open laterally, medially, and on the dorsum and mobile and free from distal phalanx distally w/ laceration to the skin. Copious blood present but no pulsatile bleeding. Tip of toe pink and w/ <2 sec cap refill. Toe tip freely moved w/o pain per pt.   Psychiatric: nnml affect  Neurologic: CN 2-12 grossly intact, moves all extremities spontaneously  Labs on Admission:  Basic Metabolic Panel:  Recent Labs Lab 02/06/14 1900  NA 139  K 3.1*  CL 94*  CO2 29  GLUCOSE 106*  BUN 23  CREATININE 1.18*  CALCIUM 8.5   Liver Function Tests: No results found for this basename: AST, ALT, ALKPHOS, BILITOT, PROT, ALBUMIN,  in the last 168 hours No results found for this basename: LIPASE, AMYLASE,  in the last 168 hours No results found for this basename: AMMONIA,  in the last 168 hours CBC:  Recent Labs Lab 02/06/14 1900  WBC 5.6  NEUTROABS 2.3  HGB 13.1  HCT 39.5  MCV 92.5  PLT 116*   Cardiac Enzymes: No results found for this basename: CKTOTAL, CKMB, CKMBINDEX, TROPONINI,  in the last 168 hours  BNP (last 3 results) No results found for this basename: PROBNP,  in the last 8760 hours CBG: No results found for this basename: GLUCAP,  in the last 168 hours  Radiological Exams on Admission: Dg Toe Great Right  02/06/2014   CLINICAL DATA:  Injury right great toe.  EXAM: RIGHT GREAT TOE  COMPARISON:  None.  FINDINGS: The patient has a transverse fracture through the proximal aspect of the distal phalanx of the right great toe. The fracture extends to the articular surface of the IP joint on the lateral side. Bony fragment off of the  proximal, lateral corner of the distal phalanx measures 0.6 x 0.4 cm. No other acute bony or joint abnormality is seen. Bandaging is present about the great toe.  IMPRESSION: Mildly comminuted fracture distal phalanx right great toe as described above.   Electronically Signed   By: Inge Rise [name]   On: 02/06/2014 19:19       Time spent: spent >70 min in direct pt care and coordination   Ebbie Sorenson J, MD [facility] [email or web address] Password [facility] 02/06/2014, 9:11 PM

## 2014-02-06 NOTE — ED Notes (Addendum)
Pt reports to the ED for eval of toe injury. Pt stood up and her right great toe got caught underneath her and the skin split open at the joint. Bleeding controlled and clean dressing in place. Pt denies falling, head injury, or LOC. VSS en route. Pt is taking blood thinners. Pt A&Ox4, resp e/u, and skin warm and dry. HR- 80 and BP- 120/palpated. Cap refill < 3 secs. Pt is diabetic. Denies pain. States she has neuropathy and cannot feel her feet.

## 2014-02-06 NOTE — Progress Notes (Signed)
[facility] paged by Amion and 15 min after paged by # pager for hosp admissions

## 2014-02-07 DIAGNOSIS — S92919B Unspecified fracture of unspecified toe(s), initial encounter for open fracture: Secondary | ICD-10-CM

## 2014-02-07 DIAGNOSIS — I5032 Chronic diastolic (congestive) heart failure: Secondary | ICD-10-CM

## 2014-02-07 DIAGNOSIS — G4733 Obstructive sleep apnea (adult) (pediatric): Secondary | ICD-10-CM

## 2014-02-07 DIAGNOSIS — M79609 Pain in unspecified limb: Secondary | ICD-10-CM

## 2014-02-07 LAB — COMPREHENSIVE METABOLIC PANEL
ALBUMIN: 2.9 g/dL — AB (ref 3.5–5.2)
ALK PHOS: 56 U/L (ref 39–117)
ALT: 21 U/L (ref 0–35)
AST: 24 U/L (ref 0–37)
Anion gap: 13 (ref 5–15)
BUN: 20 mg/dL (ref 6–23)
CHLORIDE: 96 meq/L (ref 96–112)
CO2: 27 mEq/L (ref 19–32)
Calcium: 7.8 mg/dL — ABNORMAL LOW (ref 8.4–10.5)
Creatinine, Ser: 0.96 mg/dL (ref 0.50–1.10)
GFR calc Af Amer: 78 mL/min — ABNORMAL LOW (ref >90)
GFR calc non Af Amer: 67 mL/min — ABNORMAL LOW (ref >90)
Glucose, Bld: 222 mg/dL — ABNORMAL HIGH (ref 70–99)
POTASSIUM: 3.4 meq/L — AB (ref 3.7–5.3)
Sodium: 136 mEq/L — ABNORMAL LOW (ref 137–147)
Total Bilirubin: 0.3 mg/dL (ref 0.3–1.2)
Total Protein: 6.7 g/dL (ref 6.0–8.3)

## 2014-02-07 LAB — CBC
HEMATOCRIT: 37.3 % (ref 36.0–46.0)
Hemoglobin: 12.1 g/dL (ref 12.0–15.0)
MCH: 30.6 pg (ref 26.0–34.0)
MCHC: 32.4 g/dL (ref 30.0–36.0)
MCV: 94.2 fL (ref 78.0–100.0)
Platelets: 98 10*3/uL — ABNORMAL LOW (ref 150–400)
RBC: 3.96 MIL/uL (ref 3.87–5.11)
RDW: 14.9 % (ref 11.5–15.5)
WBC: 4.7 10*3/uL (ref 4.0–10.5)

## 2014-02-07 LAB — GLUCOSE, CAPILLARY
GLUCOSE-CAPILLARY: 249 mg/dL — AB (ref 70–99)
GLUCOSE-CAPILLARY: 222 mg/dL — AB (ref 70–99)
Glucose-Capillary: 195 mg/dL — ABNORMAL HIGH (ref 70–99)
Glucose-Capillary: 262 mg/dL — ABNORMAL HIGH (ref 70–99)

## 2014-02-07 LAB — BASIC METABOLIC PANEL
Anion gap: 12 (ref 5–15)
BUN: 23 mg/dL (ref 6–23)
CHLORIDE: 94 meq/L — AB (ref 96–112)
CO2: 31 mEq/L (ref 19–32)
CREATININE: 1.13 mg/dL — AB (ref 0.50–1.10)
Calcium: 8.2 mg/dL — ABNORMAL LOW (ref 8.4–10.5)
GFR calc Af Amer: 64 mL/min — ABNORMAL LOW (ref >90)
GFR calc non Af Amer: 55 mL/min — ABNORMAL LOW (ref >90)
GLUCOSE: 191 mg/dL — AB (ref 70–99)
Potassium: 2.9 mEq/L — CL (ref 3.7–5.3)
Sodium: 137 mEq/L (ref 137–147)

## 2014-02-07 LAB — SURGICAL PCR SCREEN
MRSA, PCR: NEGATIVE
Staphylococcus aureus: NEGATIVE

## 2014-02-07 LAB — MAGNESIUM: Magnesium: 1.7 mg/dL (ref 1.5–2.5)

## 2014-02-07 LAB — PROTIME-INR
INR: 2.8 — ABNORMAL HIGH (ref 0.00–1.49)
Prothrombin Time: 29.5 seconds — ABNORMAL HIGH (ref 11.6–15.2)

## 2014-02-07 LAB — APTT: aPTT: 36 seconds (ref 24–37)

## 2014-02-07 MED ORDER — PANTOPRAZOLE SODIUM 40 MG PO TBEC
40.0000 mg | DELAYED_RELEASE_TABLET | Freq: Two times a day (BID) | ORAL | Status: DC
Start: 2014-02-07 — End: 2014-02-08
  Administered 2014-02-07 – 2014-02-08 (×3): 40 mg via ORAL
  Filled 2014-02-07 (×4): qty 1

## 2014-02-07 MED ORDER — CEFAZOLIN SODIUM-DEXTROSE 2-3 GM-% IV SOLR
2.0000 g | Freq: Three times a day (TID) | INTRAVENOUS | Status: DC
  Administered 2014-02-07 – 2014-02-08 (×4): 2 g via INTRAVENOUS
  Filled 2014-02-07 (×9): qty 50

## 2014-02-07 MED ORDER — CETYLPYRIDINIUM CHLORIDE 0.05 % MT LIQD
7.0000 mL | Freq: Two times a day (BID) | OROMUCOSAL | Status: DC
  Administered 2014-02-08: 7 mL via OROMUCOSAL

## 2014-02-07 MED ORDER — CHLORHEXIDINE GLUCONATE 0.12 % MT SOLN
15.0000 mL | Freq: Two times a day (BID) | OROMUCOSAL | Status: DC
  Administered 2014-02-07 – 2014-02-08 (×2): 15 mL via OROMUCOSAL
  Filled 2014-02-07 (×5): qty 15

## 2014-02-07 MED ORDER — HYDROCODONE-ACETAMINOPHEN 5-325 MG PO TABS
1.0000 | ORAL_TABLET | Freq: Four times a day (QID) | ORAL | Status: DC | PRN
  Administered 2014-02-07: 1 via ORAL
  Filled 2014-02-07 (×2): qty 1

## 2014-02-07 MED ORDER — POTASSIUM CHLORIDE CRYS ER 20 MEQ PO TBCR
40.0000 meq | EXTENDED_RELEASE_TABLET | Freq: Once | ORAL | Status: AC
  Administered 2014-02-07: 40 meq via ORAL
  Filled 2014-02-07: qty 2

## 2014-02-07 NOTE — Progress Notes (Signed)
Report given to RN Lavella Lemons on [place] room 3

## 2014-02-07 NOTE — Progress Notes (Signed)
Attempting to give report to [facility] in MICU, after multiple times of being told I was being quoted when in fact I was misquoted, I asked to charge nurse to please put the phone call on speaker, Gaspar Bidding states he is going to report to multiple people upset regarding bariatric bed, orders received during time with paging physicians and attempting to obtain orders for transfer. Meanwhile Lab phones with potassium level of 2.9, will ask Charge to facilitate  MICU needs while I attend to patient and medication needs.

## 2014-02-07 NOTE — Progress Notes (Signed)
Dr Marily Memos spoken to via phone. Orders obtained for transfer to higher level of care. D/T night ventilator settings. MD order for patient to continue bedrest with bed pan use secondary to r foot fracture, ventilator, and safer solution at present level of care.Lisette Grinder phoned Dr Sharol Given through the answering service and demanded BRP orders. Awaiting bed assignment, RT Erica phoned for support transporting.

## 2014-02-07 NOTE — Progress Notes (Signed)
02/07/14 0406  RN discusses potential need of urinary catheter for pt since pt is currently continent but on bed rest and due to anatomy urinates in bed. RN stated concern of pt skin due to the urine. [facility] NP Donnal Debar stated that if pt is still bedrest after AM rounds then urinary catheter would be a good idea.   No new orders at this time.   Wyn Quaker RN

## 2014-02-07 NOTE — Progress Notes (Signed)
Orthopedic Tech Progress Note Patient Details:  [name] 22-Apr-1961 [id number] Post op shoe applied Ortho Devices Type of Ortho Device: Postop shoe/boot Ortho Device/Splint Location: RLE Ortho Device/Splint Interventions: Application   Asia R Thompson 02/07/2014, 1:01 PM

## 2014-02-07 NOTE — Progress Notes (Signed)
ANTIBIOTIC CONSULT NOTE - INITIAL  Pharmacy Consult for cefazolin Indication: cellulitis  Allergies  Allergen Reactions  . Molds & Smuts Shortness Of Breath and Rash  . Nitroglycerin Er Nausea And Vomiting  . Accolate [Zafirlukast] Other (See Comments)    Reaction unknown  . Allopurinol Other (See Comments)    GI problems   . Amoxicillin-Pot Clavulanate     Other reaction(s): Unknown  . Ciprofloxacin Other (See Comments)    GI problems  . Codeine Other (See Comments)    Heart problems  . Doxycycline Other (See Comments)    Gastric problems  . Fexofenadine Other (See Comments)    Hurts joints/pain  . Keflex [Cephalexin] Diarrhea and Nausea Only  . Morphine Other (See Comments)    unknown  . Sulfa Drugs Cross Reactors Nausea And Vomiting    nausea  . Sulfonamide Derivatives Nausea Only    Patient Measurements: Height: 5\' 8"  (172.7 cm) Weight: 402 lb 9 oz (182.6 kg) IBW/kg (Calculated) : 63.9 Adjusted Body Weight: 112kg  Vital Signs: Temp: 98.5 F (36.9 C) (08/16 0400) Temp src: Oral (08/16 0400) BP: 111/49 mmHg (08/16 0400) Pulse Rate: 74 (08/16 0400) Intake/Output from previous day: 08/15 0701 - 08/16 0700 In: 277.1 [I.V.:277.1] Out: -  Intake/Output from this shift:    Labs:  Recent Labs  02/06/14 1900 02/07/14 0034  WBC 5.6  --   HGB 13.1  --   PLT 116*  --   CREATININE 1.18* 1.13*   Estimated Creatinine Clearance: 103.6 ml/min (by C-G formula based on Cr of 1.13). No results found for this basename: VANCOTROUGH, Corlis Leak, VANCORANDOM, GENTTROUGH, GENTPEAK, GENTRANDOM, TOBRATROUGH, TOBRAPEAK, TOBRARND, AMIKACINPEAK, AMIKACINTROU, AMIKACIN,  in the last 72 hours   Microbiology: Recent Results (from the past 720 hour(s))  SURGICAL PCR SCREEN     Status: None   Collection Time    02/07/14 12:19 AM      Result Value Ref Range Status   MRSA, PCR NEGATIVE  NEGATIVE Final   Staphylococcus aureus NEGATIVE  NEGATIVE Final   Comment:            The  Xpert SA Assay (FDA     approved for NASAL specimens     in patients over 52 years of age),     is one component of     a comprehensive surveillance     program.  Test performance has     been validated by [employer] for patients greater     than or equal to 84 year old.     It is not intended     to diagnose infection nor to     guide or monitor treatment.    Medical History: Past Medical History  Diagnosis Date  . OSA (obstructive sleep apnea)   . Morbid obesity   . Depressive disorder, not elsewhere classified   . Diabetes mellitus   . Asthma   . Dyslipidemia   . Clostridium difficile enterocolitis 2012  . Splenomegaly 06/01/2011  . Thrombocytopenia 06/01/2011  . Hepatosplenomegaly   . Gout   . GERD (gastroesophageal reflux disease)   . Osteoarthritis   . Polyneuropathy   . Urinary incontinence   . Amenorrhea   . CHF (congestive heart failure)   . Hyperlipidemia   . Leg swelling   . Nausea   . Weakness   . Palpitations   . PONV (postoperative nausea and vomiting)   . Anginal pain     no chest pain  in years  . Dysrhythmia     heart palpations  . Anxiety   . Pneumonia   . Shortness of breath   . Chronic kidney disease     overactive bladder  . Peripheral neuropathy   . DVT (deep venous thrombosis)     Assessment: 53 morbidly obese female admitted after stubbing her toe which caused bleeding and open fracture. Patient does not feel feet d/t neuropathy from diabetes. She was given 3g cefazolin last evening in the ED at recommendation of Dr. Sharol Given. Pharmacy asked to continue dosing. SCr 1.13 with est CrCl 65-75mL/min using NORMALIZED CrCl calculation- calculation in EPIC is affected by her large body weight and is likely not accurate. WBC normal and she is currently afebrile.  Goal of Therapy:  Proper dosing based on weight, renal function and hepatic function  Plan:  1. Cefazolin 2g IV q8h 2. Follow renal function, clinical progression, any c/s,  LOT  Kenika Sahm D. Treysean Petruzzi, PharmD, BCPS Clinical Pharmacist Pager: [phone] 02/07/2014 7:40 AM

## 2014-02-07 NOTE — Progress Notes (Signed)
Utilization Review Completed.[name] T8/16/2015  

## 2014-02-07 NOTE — Progress Notes (Signed)
VASCULAR LAB PRELIMINARY  ARTERIAL  ABI completed:    RIGHT    LEFT    PRESSURE WAVEFORM  PRESSURE WAVEFORM  BRACHIAL 122 Triphasic BRACHIAL IV SITE Triphasic  DP 106 Triphasic DP 126 Triphasic  PT 118 Triphasic PT 113 Triphasic    RIGHT LEFT  ABI 0.97 1.03   ABIs and Doppler waveforms indicate normal arterial flow bilaterally at rest.  Barack Nicodemus, RVS 02/07/2014, 11:06 AM

## 2014-02-07 NOTE — Evaluation (Signed)
Physical Therapy Evaluation Patient Details Name: [name] MRN: [id number] DOB: [birth year] Today's Date: 02/07/2014   History of Present Illness  Admitted with open fracture of R great toe; PMH includes:  Past Medical History  Diagnosis Date  . OSA (obstructive sleep apnea)   . Morbid obesity   . Depressive disorder, not elsewhere classified   . Diabetes mellitus   . Asthma   . Dyslipidemia   . Clostridium difficile enterocolitis 2012  . Splenomegaly 06/01/2011  . Thrombocytopenia 06/01/2011  . Hepatosplenomegaly   . Gout   . GERD (gastroesophageal reflux disease)   . Osteoarthritis   . Polyneuropathy   . Urinary incontinence   . Amenorrhea   . CHF (congestive heart failure)   . Hyperlipidemia   . Leg swelling   . Nausea   . Weakness   . Palpitations   . PONV (postoperative nausea and vomiting)   . Anginal pain     no chest pain in years  . Dysrhythmia     heart palpations  . Anxiety   . Pneumonia   . Shortness of breath   . Chronic kidney disease     overactive bladder  . Peripheral neuropathy   . DVT (deep venous thrombosis)        Clinical Impression  Pt admitted with above. Pt currently with functional limitations due to the deficits listed below (see PT Problem List).  Pt will benefit from skilled PT to increase their independence and safety with mobility to allow discharge to the venue listed below.       Follow Up Recommendations Home health PT    Equipment Recommendations  None recommended by PT (pretty well-equipped)    Recommendations for Other Services       Precautions / Restrictions Precautions Precautions: Fall Required Braces or Orthoses: Other Brace/Splint Other Brace/Splint: postop shoe Restrictions Weight Bearing Restrictions: Yes RLE Weight Bearing: Weight bearing as tolerated      Mobility  Bed Mobility               General bed mobility comments: on BSC upon arrival  Transfers Overall transfer level: Needs  assistance Equipment used: None Transfers: Stand Pivot Transfers   Stand pivot transfers: Min assist       General transfer comment: transferred [facility] to bari chair with min assist to help with rise and cues for hand placement; took a few pivotal steps to bari recliner on her right  Ambulation/Gait   Ambulation Distance (Feet):  (pivot steps to recliner)         General Gait Details: Pt requested that we work on progressive amb Hydrographic surveyor Rankin (Stroke Patients Only)       Balance                                             Pertinent Vitals/Pain Pain Assessment: 0-10 Pain Score: 7  Pain Location: R foot Pain Descriptors / Indicators: Aching Pain Intervention(s): Limited activity within patient's tolerance;Monitored during session;Repositioned    Home Living Family/patient expects to be discharged to:: Private residence Living Arrangements: Alone Available Help at Discharge: Personal care attendant;Available PRN/intermittently (5 hours M-F, 3 hours Sat and SUn) Type of Home: House Home Access: Level entry     Home Layout: One  level Home Equipment: Walker - 2 wheels;Walker - 4 wheels;Bedside commode;Shower seat;Wheelchair - manual Additional Comments: sleeps in recliner    Prior Function Level of Independence: Needs assistance      ADL's / Homemaking Assistance Needed: needs assist with cooking, cleaning and bathing        Hand Dominance   Dominant Hand: Right    Extremity/Trunk Assessment   Upper Extremity Assessment: Overall WFL for tasks assessed           Lower Extremity Assessment: Generalized weakness (Painful RLE with WBing)         Communication   Communication: No difficulties;Tracheostomy;Passy-Muir valve  Cognition Arousal/Alertness: Awake/alert Behavior During Therapy: WFL for tasks assessed/performed Overall Cognitive Status: Within Functional Limits for  tasks assessed                      General Comments      Exercises        Assessment/Plan    PT Assessment Patient needs continued PT services  PT Diagnosis Difficulty walking;Acute pain   PT Problem List Decreased strength;Decreased activity tolerance;Decreased balance;Decreased mobility;Decreased knowledge of use of DME;Pain  PT Treatment Interventions DME instruction;Gait training;Functional mobility training;Therapeutic activities;Balance training;Patient/family education   PT Goals (Current goals can be found in the Care Plan section) Acute Rehab PT Goals Patient Stated Goal: wants to go home tomorrow PT Goal Formulation: With patient Time For Goal Achievement: 02/14/14 Potential to Achieve Goals: Good    Frequency Min 6X/week   Barriers to discharge Decreased caregiver support Pt needs to be modified independent to be able to safely dc home    Co-evaluation               End of Session Equipment Utilized During Treatment: Gait belt (sheet roll as gait belt) Activity Tolerance: Patient tolerated treatment well Patient left: in chair;with call bell/phone within reach Nurse Communication: Mobility status    Functional Assessment Tool Used: clinical judgement Functional Limitation: Mobility: Walking and moving around Mobility: Walking and Moving Around Current Status (Q9450): At least 1 percent but less than 20 percent impaired, limited or restricted Mobility: Walking and Moving Around Goal Status [phone]): 0 percent impaired, limited or restricted    Time: 1642-1700 PT Time Calculation (min): 18 min   Charges:   PT Evaluation $Initial PT Evaluation Tier I: 1 Procedure PT Treatments $Therapeutic Activity: 8-22 mins   PT G Codes:   Functional Assessment Tool Used: clinical judgement Functional Limitation: Mobility: Walking and moving around    Dillard, [place] 02/07/2014, 5:57 PM  Roney Marion, [place] Pager  [phone] Office [phone]

## 2014-02-07 NOTE — Progress Notes (Signed)
[facility] PROGRESS NOTE  [name] IFO:[id number] DOB: 09/04/1960 DOA: 02/06/2014 PCP: [name], MD  Assessment/Plan  R Great toe transverse comminuted open fracture  -  Await Dr. Sharol Given recommendations -  ABI  - Sterile pressure dressings  -  Tylenol PRN   -  Continue Ancef day 1 -  If planning surgery, would need foley and trach replaced so she could be placed on CPAP   Anion gap acidosis likely due to ketosis dehydration and resolved with IVF  Hypokalemia  -  Continue potassium BID -  Check magnesium level -  Repeat potassium in AM  Mild elevation in creatinine without AKI, likely due to mild dehydration and trending down with IVF -  Repeat BMP in AM  DM w/ severe peripheral neuropathy:   -  Check A1c -  Continue low dose SSI  Peripheral neuropathy and MSK pain, stable, continue gabapentin, primidone  Ventilator dependent OSA and possible COPD: trach placed in 2001. Uses nightly humidified ventilator. Likely obesity hypoventilation most significant factor for this pts OSA  - CPAP per Respiratory therapy  - Daily trach care  - Continue home pulmicort, atrovent, albuterol   DVT many years ago: pt supra therapeutic w/ INR 3.23. No evidence of acute bleed outside of injured toe. Plt 116  - Heparin during admission due to possible surgical intervention for toe   Morbid Obesity: 187kg on admission. This is likely the main reason for fracture of toe and many other co-morbidities  -  Nutrition consult  -  Recommend bariatric surgery center referral  dCHF: Last Echo 2010 w/ EF 67-67% and diastolic failure. No evidence of acute exacerbation.  - Hold bumex, losartan, bystolic due to hypotension - continue home lipitor   Presumptive bladder spasms, continue toviaz   Gout, stable.  Continue colchicine and uloric   Depression/Anxiety, stable.  Continue home cymbalta, buspar, xanax  Thrombocytopenia, mild, chronic.  Trend platelets  Diet:  NPO   Access:  PIV IVF:  off Proph:  Heparin once INR subtherapeutic, currently supratherapeutic INR  Code Status: full Family Communication: patient alone Disposition Plan: pending ortho evaluation   Consultants:  Dr. Sharol Given, Orthopedics  Procedures:  XR great toe  Antibiotics:  Ancef 8/15 (late at night) >>   HPI/Subjective:  Pain controlled with tylenol.  Denies cough, fever, nausea, vomiting, diarrhea, lightheadedness.  Objective: Filed Vitals:   02/06/14 2242 02/06/14 2305 02/07/14 0339 02/07/14 0400  BP: 112/53   111/49  Pulse: 74 74 79 74  Temp: 98.7 F (37.1 C)   98.5 F (36.9 C)  TempSrc: Oral   Oral  Resp: 16 16 23  0  Height:      Weight: 182.6 kg (402 lb 9 oz)     SpO2: 94% 94% 97% 95%    Intake/Output Summary (Last 24 hours) at 02/07/14 0715 Last data filed at 02/07/14 0400  Gross per 24 hour  Intake 277.08 ml  Output      0 ml  Net 277.08 ml   Filed Weights   02/06/14 1729 02/06/14 2242  Weight: 187.336 kg (413 lb) 182.6 kg (402 lb 9 oz)    Exam:   General:  Obese WF, No acute distress  HEENT:  NCAT, MMM  Cardiovascular:  RRR, nl S1, S2 no mrg, warm extremities  Respiratory:  CTAB, no increased WOB  Abdomen:   NABS, soft, NT/ND  MSK:   Normal tone and bulk, no LEE.  Right foot great toe with < 2 sec CR  at tip, warm, transverse bleeding laceration along the cuticle line and loose.  Still bleeding a little.    Neuro:  Grossly intact  Data Reviewed: Basic Metabolic Panel:  Recent Labs Lab 02/06/14 1900 02/07/14 0034  NA 139 137  K 3.1* 2.9*  CL 94* 94*  CO2 29 31  GLUCOSE 106* 191*  BUN 23 23  CREATININE 1.18* 1.13*  CALCIUM 8.5 8.2*   Liver Function Tests: No results found for this basename: AST, ALT, ALKPHOS, BILITOT, PROT, ALBUMIN,  in the last 168 hours No results found for this basename: LIPASE, AMYLASE,  in the last 168 hours No results found for this basename: AMMONIA,  in the last 168 hours CBC:  Recent Labs Lab  02/06/14 1900  WBC 5.6  NEUTROABS 2.3  HGB 13.1  HCT 39.5  MCV 92.5  PLT 116*   Cardiac Enzymes: No results found for this basename: CKTOTAL, CKMB, CKMBINDEX, TROPONINI,  in the last 168 hours BNP (last 3 results) No results found for this basename: PROBNP,  in the last 8760 hours CBG:  Recent Labs Lab 02/06/14 2335 02/07/14 0418  GLUCAP 178* 195*    Recent Results (from the past 240 hour(s))  SURGICAL PCR SCREEN     Status: None   Collection Time    02/07/14 12:19 AM      Result Value Ref Range Status   MRSA, PCR NEGATIVE  NEGATIVE Final   Staphylococcus aureus NEGATIVE  NEGATIVE Final   Comment:            The Xpert SA Assay (FDA     approved for NASAL specimens     in patients over 34 years of age),     is one component of     a comprehensive surveillance     program.  Test performance has     been validated by [employer] for patients greater     than or equal to 56 year old.     It is not intended     to diagnose infection nor to     guide or monitor treatment.     Studies: Dg Toe Great Right  02/06/2014   CLINICAL DATA:  Injury right great toe.  EXAM: RIGHT GREAT TOE  COMPARISON:  None.  FINDINGS: The patient has a transverse fracture through the proximal aspect of the distal phalanx of the right great toe. The fracture extends to the articular surface of the IP joint on the lateral side. Bony fragment off of the proximal, lateral corner of the distal phalanx measures 0.6 x 0.4 cm. No other acute bony or joint abnormality is seen. Bandaging is present about the great toe.  IMPRESSION: Mildly comminuted fracture distal phalanx right great toe as described above.   Electronically Signed   By: Inge Rise [name]   On: 02/06/2014 19:19    Scheduled Meds: . antiseptic oral rinse  7 mL Mouth Rinse q12n4p  . atorvastatin  10 mg Oral QHS  . busPIRone  7.5 mg Oral BID  . chlorhexidine  15 mL Mouth Rinse BID  . colchicine  0.6 mg Oral Daily  . DULoxetine  30  mg Oral BID  . febuxostat  80 mg Oral QHS  . fesoterodine  4 mg Oral Daily  . gabapentin  100 mg Oral Daily  . gabapentin  200 mg Oral QHS  . gabapentin  200 mg Oral Q1200  . insulin aspart  0-5 Units Subcutaneous QHS  .  insulin aspart  0-9 Units Subcutaneous TID WC  . meclizine  25 mg Oral QHS  . pantoprazole  40 mg Oral BID AC  . primidone  50 mg Oral BID   Continuous Infusions: . dextrose 5 % and 0.45 % NaCl with KCl 40 mEq/L 125 mL/hr at 02/07/14 0147    Active Problems:   Open fracture of toe    Time spent: 30 min    Sennie Borden, [facility]  [facility] Pager [phone]. If 7PM-7AM, please contact night-coverage at [email or web address], password [facility] 02/07/2014, 7:15 AM  LOS: 1 day

## 2014-02-07 NOTE — Consult Note (Signed)
Reason for Consult: Laceration right great toe with nondisplaced fracture Referring Physician: Dr. Allene Dillon is an 53 y.o. female.  HPI: Patient is a [name] with multiple medical problems including morbid obesity 2 states she stubbed her toe sustaining a nondisplaced fracture with laceration essentially an open fracture. Patient states that she cannot go home from the emergency room and was admitted medically and is seen today in consultation for the open fracture right great toe.  Past Medical History  Diagnosis Date  . OSA (obstructive sleep apnea)   . Morbid obesity   . Depressive disorder, not elsewhere classified   . Diabetes mellitus   . Asthma   . Dyslipidemia   . Clostridium difficile enterocolitis 2012  . Splenomegaly 06/01/2011  . Thrombocytopenia 06/01/2011  . Hepatosplenomegaly   . Gout   . GERD (gastroesophageal reflux disease)   . Osteoarthritis   . Polyneuropathy   . Urinary incontinence   . Amenorrhea   . CHF (congestive heart failure)   . Hyperlipidemia   . Leg swelling   . Nausea   . Weakness   . Palpitations   . PONV (postoperative nausea and vomiting)   . Anginal pain     no chest pain in years  . Dysrhythmia     heart palpations  . Anxiety   . Pneumonia   . Shortness of breath   . Chronic kidney disease     overactive bladder  . Peripheral neuropathy   . DVT (deep venous thrombosis)     Past Surgical History  Procedure Laterality Date  . Cholecystectomy    . Tracheostomy  2001  . Endometrial ablation    . Skin graft    . Finger surgery      ring finger on right hand  . Esophageal dilation  2003  . Breast biopsy      needle core, left  . Dilation and curettage of uterus      times 2  . Tracheal dilitation  01/24/2012    Procedure: TRACHEAL DILITATION;  Surgeon: Melissa Montane, MD;  Location: [place];  Service: ENT;  Laterality: N/A;  Trache change with possible dilation, from size 6 to size 7 uncuffed  . Amputation Right  11/19/2013    Procedure: AMPUTATION DIGIT;  Surgeon: Cammie Sickle, MD;  Location: [place];  Service: Orthopedics;  Laterality: Right;  Partial amputation right long finger, Debridement right ring finger     Family History  Problem Relation Age of Onset  . Diabetes Father   . Hypertension Father   . Dementia Father     Deceased, 41  . Pneumonia Father   . Heart disease Mother   . Clotting disorder Mother   . Hypertension Mother   . Heart attack Mother     Deceased, 40  . Multiple myeloma Paternal Grandmother   . Cancer Paternal Grandmother     multiple myoloma  . Heart disease Paternal Grandfather   . Kidney disease Maternal Grandmother   . Hypertension Sister   . Cancer Paternal Aunt     breast  . Cancer Cousin     breast - all three    Social History:  reports that she has never smoked. She has never used smokeless tobacco. She reports that she does not drink alcohol or use illicit drugs.  Allergies:  Allergies  Allergen Reactions  . Molds & Smuts Shortness Of Breath and Rash  . Nitroglycerin Er Nausea And Vomiting  .  Accolate [Zafirlukast] Other (See Comments)    Reaction unknown  . Allopurinol Other (See Comments)    GI problems   . Amoxicillin-Pot Clavulanate     Other reaction(s): Unknown  . Ciprofloxacin Other (See Comments)    GI problems  . Codeine Other (See Comments)    Heart problems  . Doxycycline Other (See Comments)    Gastric problems  . Fexofenadine Other (See Comments)    Hurts joints/pain  . Keflex [Cephalexin] Diarrhea and Nausea Only  . Morphine Other (See Comments)    unknown  . Sulfa Drugs Cross Reactors Nausea And Vomiting    nausea  . Sulfonamide Derivatives Nausea Only    Medications: I have reviewed the patient's current medications.  Results for orders placed during the hospital encounter of 02/06/14 (from the past 48 hour(s))  CBC WITH DIFFERENTIAL     Status: Abnormal   Collection Time    02/06/14   7:00 PM      Result Value Ref Range   WBC 5.6  4.0 - 10.5 K/uL   RBC 4.27  3.87 - 5.11 MIL/uL   Hemoglobin 13.1  12.0 - 15.0 g/dL   HCT 39.5  36.0 - 46.0 %   MCV 92.5  78.0 - 100.0 fL   MCH 30.7  26.0 - 34.0 pg   MCHC 33.2  30.0 - 36.0 g/dL   RDW 15.0  11.5 - 15.5 %   Platelets 116 (*) 150 - 400 K/uL   Comment: REPEATED TO VERIFY     SPECIMEN CHECKED FOR CLOTS     PLATELET COUNT CONFIRMED BY SMEAR   Neutrophils Relative % 42 (*) 43 - 77 %   Neutro Abs 2.3  1.7 - 7.7 K/uL   Lymphocytes Relative 36  12 - 46 %   Lymphs Abs 2.0  0.7 - 4.0 K/uL   Monocytes Relative 20 (*) 3 - 12 %   Monocytes Absolute 1.1 (*) 0.1 - 1.0 K/uL   Eosinophils Relative 2  0 - 5 %   Eosinophils Absolute 0.1  0.0 - 0.7 K/uL   Basophils Relative 1  0 - 1 %   Basophils Absolute 0.0  0.0 - 0.1 K/uL  PROTIME-INR     Status: Abnormal   Collection Time    02/06/14  7:00 PM      Result Value Ref Range   Prothrombin Time 33.0 (*) 11.6 - 15.2 seconds   INR 3.23 (*) 0.00 - 5.36  BASIC METABOLIC PANEL     Status: Abnormal   Collection Time    02/06/14  7:00 PM      Result Value Ref Range   Sodium 139  137 - 147 mEq/L   Potassium 3.1 (*) 3.7 - 5.3 mEq/L   Chloride 94 (*) 96 - 112 mEq/L   CO2 29  19 - 32 mEq/L   Glucose, Bld 106 (*) 70 - 99 mg/dL   BUN 23  6 - 23 mg/dL   Creatinine, Ser 1.18 (*) 0.50 - 1.10 mg/dL   Calcium 8.5  8.4 - 10.5 mg/dL   GFR calc non Af Amer 52 (*) >90 mL/min   GFR calc Af Amer 61 (*) >90 mL/min   Comment: (NOTE)     The eGFR has been calculated using the CKD EPI equation.     This calculation has not been validated in all clinical situations.     eGFR's persistently <90 mL/min signify possible Chronic Kidney     Disease.   Anion  gap 16 (*) 5 - 15  GLUCOSE, CAPILLARY     Status: Abnormal   Collection Time    02/06/14 11:35 PM      Result Value Ref Range   Glucose-Capillary 178 (*) 70 - 99 mg/dL  SURGICAL PCR SCREEN     Status: None   Collection Time    02/07/14 12:19 AM       Result Value Ref Range   MRSA, PCR NEGATIVE  NEGATIVE   Staphylococcus aureus NEGATIVE  NEGATIVE   Comment:            The Xpert SA Assay (FDA     approved for NASAL specimens     in patients over 47 years of age),     is one component of     a comprehensive surveillance     program.  Test performance has     been validated by [employer] for patients greater     than or equal to 6 year old.     It is not intended     to diagnose infection nor to     guide or monitor treatment.  BASIC METABOLIC PANEL     Status: Abnormal   Collection Time    02/07/14 12:34 AM      Result Value Ref Range   Sodium 137  137 - 147 mEq/L   Potassium 2.9 (*) 3.7 - 5.3 mEq/L   Comment: CRITICAL RESULT CALLED TO, READ BACK BY AND VERIFIED WITH:     H PENG,RN [id number] WILDERK   Chloride 94 (*) 96 - 112 mEq/L   CO2 31  19 - 32 mEq/L   Glucose, Bld 191 (*) 70 - 99 mg/dL   BUN 23  6 - 23 mg/dL   Creatinine, Ser 1.13 (*) 0.50 - 1.10 mg/dL   Calcium 8.2 (*) 8.4 - 10.5 mg/dL   GFR calc non Af Amer 55 (*) >90 mL/min   GFR calc Af Amer 64 (*) >90 mL/min   Comment: (NOTE)     The eGFR has been calculated using the CKD EPI equation.     This calculation has not been validated in all clinical situations.     eGFR's persistently <90 mL/min signify possible Chronic Kidney     Disease.   Anion gap 12  5 - 15  GLUCOSE, CAPILLARY     Status: Abnormal   Collection Time    02/07/14  4:18 AM      Result Value Ref Range   Glucose-Capillary 195 (*) 70 - 99 mg/dL   Comment 1 Notify RN    COMPREHENSIVE METABOLIC PANEL     Status: Abnormal   Collection Time    02/07/14  7:45 AM      Result Value Ref Range   Sodium 136 (*) 137 - 147 mEq/L   Potassium 3.4 (*) 3.7 - 5.3 mEq/L   Chloride 96  96 - 112 mEq/L   CO2 27  19 - 32 mEq/L   Glucose, Bld 222 (*) 70 - 99 mg/dL   BUN 20  6 - 23 mg/dL   Creatinine, Ser 0.96  0.50 - 1.10 mg/dL   Calcium 7.8 (*) 8.4 - 10.5 mg/dL   Total Protein 6.7  6.0 - 8.3 g/dL    Albumin 2.9 (*) 3.5 - 5.2 g/dL   AST 24  0 - 37 U/L   ALT 21  0 - 35 U/L   Alkaline Phosphatase 56  39 -  117 U/L   Total Bilirubin 0.3  0.3 - 1.2 mg/dL   GFR calc non Af Amer 67 (*) >90 mL/min   GFR calc Af Amer 78 (*) >90 mL/min   Comment: (NOTE)     The eGFR has been calculated using the CKD EPI equation.     This calculation has not been validated in all clinical situations.     eGFR's persistently <90 mL/min signify possible Chronic Kidney     Disease.   Anion gap 13  5 - 15  CBC     Status: Abnormal   Collection Time    02/07/14  7:45 AM      Result Value Ref Range   WBC 4.7  4.0 - 10.5 K/uL   RBC 3.96  3.87 - 5.11 MIL/uL   Hemoglobin 12.1  12.0 - 15.0 g/dL   HCT 37.3  36.0 - 46.0 %   MCV 94.2  78.0 - 100.0 fL   MCH 30.6  26.0 - 34.0 pg   MCHC 32.4  30.0 - 36.0 g/dL   RDW 14.9  11.5 - 15.5 %   Platelets 98 (*) 150 - 400 K/uL   Comment: CONSISTENT WITH PREVIOUS RESULT  PROTIME-INR     Status: Abnormal   Collection Time    02/07/14  7:45 AM      Result Value Ref Range   Prothrombin Time 29.5 (*) 11.6 - 15.2 seconds   INR 2.80 (*) 0.00 - 1.49  APTT     Status: None   Collection Time    02/07/14  7:45 AM      Result Value Ref Range   aPTT 36  24 - 37 seconds  MAGNESIUM     Status: None   Collection Time    02/07/14  7:45 AM      Result Value Ref Range   Magnesium 1.7  1.5 - 2.5 mg/dL    Dg Toe Great Right  02/06/2014   CLINICAL DATA:  Injury right great toe.  EXAM: RIGHT GREAT TOE  COMPARISON:  None.  FINDINGS: The patient has a transverse fracture through the proximal aspect of the distal phalanx of the right great toe. The fracture extends to the articular surface of the IP joint on the lateral side. Bony fragment off of the proximal, lateral corner of the distal phalanx measures 0.6 x 0.4 cm. No other acute bony or joint abnormality is seen. Bandaging is present about the great toe.  IMPRESSION: Mildly comminuted fracture distal phalanx right great toe as described  above.   Electronically Signed   By: Inge Rise [name]   On: 02/06/2014 19:19    Review of Systems  All other systems reviewed and are negative.  Blood pressure 122/51, pulse 83, temperature 98.1 F (36.7 C), temperature source Oral, resp. rate 21, height 5' 8"  (1.727 m), weight 182.6 kg (402 lb 9 oz), SpO2 97.00%. Physical Exam On examination patient is alert and oriented there is no adenopathy no cellulitis she has a transverse laceration over the dorsum of the great toe the toe was straight the laceration is well approximated. Review of the radiograph shows a nondisplaced fracture of the distal phalanx of the right great toe. Assessment/Plan: Assessment: Nondisplaced fracture right great toe with open laceration open fracture.  Plan: Recommended a postoperative shoe weightbearing as tolerated dressing changes daily. Patient's wound was cleansed on initial presentation. We'll order a postoperative shoe and physical therapy. Discussed with the patient is unable to ambulate with the  postoperative shoe with therapy she would need to be discharged to skilled nursing. I will followup in the office in 2 weeks. Discussed that she is at increased risk of possible infection possible amputation of the great toe. I will followup as outpatient.   Levie Wages V 02/07/2014, 9:29 AM

## 2014-02-07 NOTE — Progress Notes (Signed)
[name] in Medical ICU states he will report me,( as primary nurse) for not having placed patient on Bariatric bed. [name] was informed patient refused to be placed on Bariatric bed in ED and upon arrival to floor. Charge nurse aware_(Heuy)

## 2014-02-07 NOTE — Progress Notes (Signed)
Patient moved to MICU room 12. Update given to [facility] at bedside

## 2014-02-07 NOTE — Progress Notes (Signed)
02/07/14 0330  Pt arrives on unit from 5N around 0230. RN pages on [facility] NP M. Lynch on admission regarding questions about CPAP/ Vent orders. MLynch recommends that RN call Dr. Marily Memos (MD who admitted pt). RN paged Dr. Marily Memos at [phone] and [phone] with no response. RN then tried to page Dr. Joslyn Devon at [phone] with no response. RN then had to page MLynch again to discuss current status of the pt. RN spoke with eLink MD Deterding to inform MD of the arrival of the pt as a stepdown overflow from 5N. RN discussed the fact that pt has cuffless trach and needs cuffed trach to be hooked to ventilator on CPAP mode. eLink MD Deterding stated that pt O2 Sats are 100% on room air and if currently breathing fine, there is no need to change trach tonight. RN informed [facility] NP Lynch of update.   RN will continue to monitor.  Wyn Quaker RN

## 2014-02-07 NOTE — Progress Notes (Signed)
02/07/14 0454  Dr. Joslyn Devon with [facility] returns RN's page (from [phone]). RN updated MD about current plan of care and discussion with MLynch.    No new orders given.   Wyn Quaker RN

## 2014-02-07 NOTE — Progress Notes (Signed)
Phoned nurse practionioner, M. Lynch regarding orders for cpap via trach and need for higher level of care transfer. 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

## 2014-02-07 NOTE — Discharge Instructions (Signed)
Dry dressing change daily right great toe. Darco shoe for ambulation. Weightbearing as tolerated.

## 2014-02-08 DIAGNOSIS — S92919B Unspecified fracture of unspecified toe(s), initial encounter for open fracture: Secondary | ICD-10-CM | POA: Diagnosis not present

## 2014-02-08 DIAGNOSIS — D62 Acute posthemorrhagic anemia: Secondary | ICD-10-CM

## 2014-02-08 DIAGNOSIS — E876 Hypokalemia: Secondary | ICD-10-CM

## 2014-02-08 LAB — BASIC METABOLIC PANEL
Anion gap: 11 (ref 5–15)
BUN: 17 mg/dL (ref 6–23)
CALCIUM: 8 mg/dL — AB (ref 8.4–10.5)
CO2: 28 mEq/L (ref 19–32)
Chloride: 97 mEq/L (ref 96–112)
Creatinine, Ser: 0.82 mg/dL (ref 0.50–1.10)
GFR, EST NON AFRICAN AMERICAN: 81 mL/min — AB (ref >90)
Glucose, Bld: 217 mg/dL — ABNORMAL HIGH (ref 70–99)
Potassium: 3 mEq/L — ABNORMAL LOW (ref 3.7–5.3)
SODIUM: 136 meq/L — AB (ref 137–147)

## 2014-02-08 LAB — CBC
HCT: 35.8 % — ABNORMAL LOW (ref 36.0–46.0)
Hemoglobin: 11.5 g/dL — ABNORMAL LOW (ref 12.0–15.0)
MCH: 29.9 pg (ref 26.0–34.0)
MCHC: 32.1 g/dL (ref 30.0–36.0)
MCV: 93.2 fL (ref 78.0–100.0)
PLATELETS: 93 10*3/uL — AB (ref 150–400)
RBC: 3.84 MIL/uL — ABNORMAL LOW (ref 3.87–5.11)
RDW: 15 % (ref 11.5–15.5)
WBC: 4.6 10*3/uL (ref 4.0–10.5)

## 2014-02-08 LAB — GLUCOSE, CAPILLARY
Glucose-Capillary: 210 mg/dL — ABNORMAL HIGH (ref 70–99)
Glucose-Capillary: 280 mg/dL — ABNORMAL HIGH (ref 70–99)

## 2014-02-08 LAB — HEMOGLOBIN A1C
Hgb A1c MFr Bld: 6.8 % — ABNORMAL HIGH (ref <5.7)
Mean Plasma Glucose: 148 mg/dL — ABNORMAL HIGH (ref <117)

## 2014-02-08 MED ORDER — POTASSIUM CHLORIDE CRYS ER 20 MEQ PO TBCR
40.0000 meq | EXTENDED_RELEASE_TABLET | Freq: Once | ORAL | Status: AC
  Administered 2014-02-08: 40 meq via ORAL
  Filled 2014-02-08: qty 2

## 2014-02-08 NOTE — Progress Notes (Signed)
Physical Therapy Treatment Patient Details Name: [name] MRN: [id number] DOB: Nov 11, 1960 Today's Date: 02/08/2014    History of Present Illness Admitted with open fracture of R great toe;    PT Comments    Patient agreeable to ambulation in the room today. Patient is planning to DC via ambulance later today. Patient has caregiver at home with her. Patient motivated to work with therapy and educated for safe mobility at home.   Follow Up Recommendations  Home health PT     Equipment Recommendations  None recommended by PT    Recommendations for Other Services       Precautions / Restrictions Precautions Precautions: Fall Required Braces or Orthoses: Other Brace/Splint Other Brace/Splint: postop shoe Restrictions RLE Weight Bearing: Weight bearing as tolerated    Mobility  Bed Mobility Overal bed mobility: Modified Independent                Transfers Overall transfer level: Needs assistance Equipment used: Rolling walker (2 wheeled) Transfers: Sit to/from Stand Sit to Stand: Min guard         General transfer comment: MinGuard for safety. CUes for postioning prior to sitting  Ambulation/Gait Ambulation/Gait assistance: Min guard Ambulation Distance (Feet): 15 Feet Assistive device: Rolling walker (2 wheeled) Gait Pattern/deviations: Step-through pattern;Decreased stride length;Wide base of support     General Gait Details: patient able to walk in room with no LOB. Patient with safe technique.    Stairs            Wheelchair Mobility    Modified Rankin (Stroke Patients Only)       Balance                                    Cognition Arousal/Alertness: Awake/alert Behavior During Therapy: WFL for tasks assessed/performed Overall Cognitive Status: Within Functional Limits for tasks assessed                      Exercises      General Comments        Pertinent Vitals/Pain Pain Assessment: No/denies  pain    Home Living                      Prior Function            PT Goals (current goals can now be found in the care plan section) Progress towards PT goals: Progressing toward goals    Frequency  Min 6X/week    PT Plan Current plan remains appropriate    Co-evaluation             End of Session   Activity Tolerance: Patient tolerated treatment well Patient left: in chair;with call bell/phone within reach     Time: 1335-1408 PT Time Calculation (min): 33 min  Charges:  $Gait Training: 8-22 mins $Therapeutic Activity: 8-22 mins                    G Codes:      Jacqualyn Posey 02/08/2014, 2:54 PM 02/08/2014 Jacqualyn Posey PTA [phone] pager [phone] office

## 2014-02-08 NOTE — Discharge Summary (Signed)
Physician Discharge Summary  ANANI GU ZWC:[id number] DOB: 1960/08/26 DOA: 02/06/2014  PCP: [name], MD  Admit date: 02/06/2014 Discharge date: 02/08/2014  Recommendations for Outpatient Follow-up:  1. F/u with Dr. Sharol Given in 2 weeks 2. Home health PT/RN  3. PCP within 2 weeks.  pls f/u on A1c.  CBGs were elevated during this admission.    Discharge Diagnoses:  Active Problems:   Morbid obesity   OBESITY HYPOVENTILATION SYNDROME   ASTHMA, MILD   Thrombocytopenia   Diabetes mellitus type 2 in obese   Current use of long term anticoagulation   Chronic diastolic heart failure   Open fracture of toe   Hypokalemia   Acute blood loss anemia   Discharge Condition: stable, improved  Diet recommendation: diabetic/healthy heart  Wt Readings from Last 3 Encounters:  02/06/14 182.6 kg (402 lb 9 oz)  12/30/13 187.336 kg (413 lb)  12/11/13 187.336 kg (413 lb)    History of present illness:   53 yo F with hx of DM type 2 with peripheral neuropathy, DVT many years ago on chronic A/C, chronic diastolic heart failure, gout, depression/anxiety, thrombocytopenia, obesity, OSA with trach and nightly CPAP p/w right big toe injury while getting out of recliner causing open transverse fracture.    Hospital Course:   Right great toe transverse comminuted fracture.  She was seen by Dr. Sharol Given who recommended daily dry dressing changes and ortho shoe.  She received prophylactic antibiotics for 48 hours and she already has pain medication at home.  She worked with PT who recommended home health PT and she will have an RN assist with examining the foot and doing dressing changes.  ABI were obtained because of difficult to find dorsalis pedis pulse but were normal bilaterally.    Anion gap acidosis likely due to ketosis dehydration and resolved with IVF.    Hypokalemia due to diuretics.  Given oral potassium repletion and should continue home diuretic and potassium regimen.    Mild  elevation in creatinine without AKI, likely due to mild dehydration and trended down with IVF.  DM w/ severe peripheral neuropathy, A1c pending.  She should continue her previous diabetes regimen.    Peripheral neuropathy and MSK pain, stable, continued gabapentin, primidone and prn narcotics.  Ventilator dependent OSA/OHS and possible COPD: trach placed in 2001. Uses nightly humidified ventilator.  She would have needed her trach replaced with a cuffed trach to use our ventilator system but since she was breathing all right without, this was deferred.  She should resume her previous nightly CPAP as before.  Continued home pulmicort, atrovent, albuterol.  DVT many years ago: pt supra therapeutic w/ INR 3.23 but trended down to 2.8 on 8/16. No evidence of acute bleed outside of injured toe. Plt  93.  Warfarin was held during admission and she may resume her warfarin tomorrow.    Morbid Obesity: 187 kg on admission.  Defer management to PCP.  Recommend bariatric surgery center referral if not already complete.    dCHF: Last Echo 2010 w/ EF 23-53% and diastolic failure. No evidence of acute exacerbation.  Initially held bumex, losartan, bystolic due to hypotension, but may resume at discharge.    Presumptive bladder spasms, continued toviaz  Gout, stable. Continued colchicine and uloric  Depression/Anxiety, stable. Continued home cymbalta, buspar, xanax  Mild acute blood loss anemia.  PCP to repeat hgb in 2 weeks. Thrombocytopenia, mild, chronic.  May have cirrhosis from NASH.  Trend platelets  Consultants:  Dr. Sharol Given, Orthopedics  Procedures:  XR great toe ABI Antibiotics:  Ancef 8/15 >> 8/17   Discharge Exam: Filed Vitals:   02/08/14 0813  BP:   Pulse: 75  Temp:   Resp: 20   Filed Vitals:   02/07/14 2325 02/08/14 0313 02/08/14 0623 02/08/14 0813  BP:   104/81   Pulse: 90 99 65 75  Temp:   97.8 F (36.6 C)   TempSrc:   Oral   Resp: 18 20 20 20   Height:      Weight:       SpO2: 99% 98% 100% 99%    General: Obese WF, No acute distress  HEENT: NCAT, MMM  Cardiovascular: RRR, nl S1, S2 no mrg, warm extremities  Respiratory: CTAB, no increased WOB  Abdomen: NABS, soft, NT/ND  MSK: Normal tone and bulk, no LEE. Right foot great toe recently bandaged by RN so did not remove this AM.  Dressing c/d/i.    Neuro: Grossly intact   Discharge Instructions      Discharge Instructions   (HEART FAILURE PATIENTS) Call MD:  Anytime you have any of the following symptoms: 1) 3 pound weight gain in 24 hours or 5 pounds in 1 week 2) shortness of breath, with or without a dry hacking cough 3) swelling in the hands, feet or stomach 4) if you have to sleep on extra pillows at night in order to breathe.    Complete by:  As directed      Call MD for:  difficulty breathing, headache or visual disturbances    Complete by:  As directed      Call MD for:  extreme fatigue    Complete by:  As directed      Call MD for:  hives    Complete by:  As directed      Call MD for:  persistant dizziness or light-headedness    Complete by:  As directed      Call MD for:  persistant nausea and vomiting    Complete by:  As directed      Call MD for:  redness, tenderness, or signs of infection (pain, swelling, redness, odor or green/yellow discharge around incision site)    Complete by:  As directed      Call MD for:  severe uncontrolled pain    Complete by:  As directed      Call MD for:  temperature >100.4    Complete by:  As directed      Diet - low sodium heart healthy    Complete by:  As directed      Diet Carb Modified    Complete by:  As directed      Discharge instructions    Complete by:  As directed   You were hospitalized with right big toe fracture.  Please change dressing with dry dressing material once daily or more often if needed.  Please call Dr. Jess Barters office immediately if you notice worsening bleeding or signs of infection.  Follow up with Dr. Sharol Given in two weeks.  Wear  your shoe when up and about during the day.  Home health nurse and physical therapist will be arranged to assist you.  Please follow up with your primary care doctor for diabetes management.     Increase activity slowly    Complete by:  As directed             Medication List         acetaminophen 500 MG tablet  Commonly known as:  TYLENOL  Take 1,000 mg by mouth every 6 (six) hours as needed for mild pain. For pain     albuterol (2.5 MG/3ML) 0.083% nebulizer solution  Commonly known as:  PROVENTIL  Take 2.5 mg by nebulization every 4 (four) hours as needed. For shortness of breath     ALPRAZolam 0.5 MG tablet  Commonly known as:  XANAX  Take 0.5 mg by mouth 2 (two) times daily as needed. For anxiety     ANTIVERT 25 MG tablet  Generic drug:  meclizine  Take 25 mg by mouth at bedtime.     atorvastatin 10 MG tablet  Commonly known as:  LIPITOR  Take 10 mg by mouth at bedtime.     budesonide 0.25 MG/2ML nebulizer solution  Commonly known as:  PULMICORT  Take 0.25 mg by nebulization 2 (two) times daily as needed (shortness of breath).     bumetanide 2 MG tablet  Commonly known as:  BUMEX  Take 1 tablet (2 mg total) by mouth daily.     busPIRone 5 MG tablet  Commonly known as:  BUSPAR  Take 7.5 mg by mouth 2 (two) times daily.     cholestyramine 4 G packet  Commonly known as:  QUESTRAN  Take 1 packet by mouth as needed. For loose stools, IBS.     clotrimazole 1 % cream  Commonly known as:  LOTRIMIN  Apply 1 application topically daily. To stomach     colchicine 0.6 MG tablet  Take 0.6 mg by mouth daily.     DULoxetine 30 MG capsule  Commonly known as:  CYMBALTA  Take 1 capsule (30 mg total) by mouth 2 (two) times daily.     ergocalciferol 50000 UNITS capsule  Commonly known as:  VITAMIN D2  Take 50,000 Units by mouth once a week. Take on Fridays     fish oil-omega-3 fatty acids 1000 MG capsule  Take 1 g by mouth 2 (two) times daily.     gabapentin 100 MG  capsule  Commonly known as:  NEURONTIN  Take 100 mg by mouth See admin instructions. Take one tablet in the morning, 2 tablets in the afternoon, and 2 tablets at bedtime     guaiFENesin 600 MG 12 hr tablet  Commonly known as:  MUCINEX  Take 600 mg by mouth 2 (two) times daily.     HUMULIN R 500 UNIT/ML Soln injection  Generic drug:  insulin regular human CONCENTRATED  Inject 28 Units into the skin 2 (two) times daily before a meal.     HYDROcodone-acetaminophen 5-325 MG per tablet  Commonly known as:  NORCO/VICODIN  Take 1 tablet by mouth every 6 (six) hours as needed for moderate pain.     INVOKANA 100 MG Tabs  Generic drug:  Canagliflozin  Take 100 mg by mouth daily.     ipratropium 0.02 % nebulizer solution  Commonly known as:  ATROVENT  Take 0.5 mg by nebulization 4 (four) times daily as needed for wheezing or shortness of breath.     loratadine 10 MG tablet  Commonly known as:  CLARITIN  Take 10 mg by mouth daily as needed. For allergies     losartan 100 MG tablet  Commonly known as:  COZAAR  Take 1 tablet (100mg ) by mouth daily.     metolazone 5 MG tablet  Commonly known as:  ZAROXOLYN  Take 1 tablet (5 mg total) by mouth See admin instructions. 3 times weekly as needed  for edema     multivitamin capsule  Take 1 capsule by mouth daily.     nebivolol 10 MG tablet  Commonly known as:  BYSTOLIC  Take 10 mg by mouth daily.     omeprazole 40 MG capsule  Commonly known as:  PRILOSEC  Take 40 mg by mouth 2 (two) times daily.     ondansetron 4 MG tablet  Commonly known as:  ZOFRAN  Take 4 mg by mouth every 8 (eight) hours as needed for nausea.     oxyCODONE-acetaminophen 5-325 MG per tablet  Commonly known as:  PERCOCET/ROXICET  Take 1-2 tablets by mouth every 6 (six) hours as needed for moderate pain or severe pain.     potassium chloride SA 20 MEQ tablet  Commonly known as:  K-DUR,KLOR-CON  Take 20 mEq by mouth 3 (three) times daily. 1 in AM, 2 at lunch, 1 at  supper and 2 at bedtime     primidone 50 MG tablet  Commonly known as:  MYSOLINE  Take 50 mg by mouth 2 (two) times daily. Take one tablet twice daily     PROBIOTIC PO  Take 1 tablet by mouth daily.     TOVIAZ 4 MG Tb24 tablet  Generic drug:  fesoterodine  Take 4 mg by mouth daily.     ULORIC 80 MG Tabs  Generic drug:  Febuxostat  Take 80 mg by mouth at bedtime.     VICTOZA 18 MG/3ML Soln injection  Generic drug:  Liraglutide  Inject 1.8 mg into the skin daily.     VITAMIN B COMPLEX PO  Take 1 tablet by mouth daily.     warfarin 5 MG tablet  Commonly known as:  COUMADIN  Take 5 mg by mouth daily.       Follow-up Information   Follow up with DUDA,MARCUS V, MD In 2 weeks.   Specialty:  Orthopedic Surgery   Contact information:   [place] [address] [phone]       Follow up with [name], MD. Schedule an appointment as soon as possible for a visit in 2 weeks. (As needed)    Specialty:  Family Medicine   Contact information:   [address] [place] [address] [phone]        The results of significant diagnostics from this hospitalization (including imaging, microbiology, ancillary and laboratory) are listed below for reference.    Significant Diagnostic Studies: Dg Toe Great Right  02/06/2014   CLINICAL DATA:  Injury right great toe.  EXAM: RIGHT GREAT TOE  COMPARISON:  None.  FINDINGS: The patient has a transverse fracture through the proximal aspect of the distal phalanx of the right great toe. The fracture extends to the articular surface of the IP joint on the lateral side. Bony fragment off of the proximal, lateral corner of the distal phalanx measures 0.6 x 0.4 cm. No other acute bony or joint abnormality is seen. Bandaging is present about the great toe.  IMPRESSION: Mildly comminuted fracture distal phalanx right great toe as described above.   Electronically Signed   By: Inge Rise [name]    On: 02/06/2014 19:19    Microbiology: Recent Results (from the past 240 hour(s))  SURGICAL PCR SCREEN     Status: None   Collection Time    02/07/14 12:19 AM      Result Value Ref Range Status   MRSA, PCR NEGATIVE  NEGATIVE Final   Staphylococcus aureus  NEGATIVE  NEGATIVE Final   Comment:            The Xpert SA Assay (FDA     approved for NASAL specimens     in patients over 27 years of age),     is one component of     a comprehensive surveillance     program.  Test performance has     been validated by [employer] for patients greater     than or equal to 41 year old.     It is not intended     to diagnose infection nor to     guide or monitor treatment.     Labs: Basic Metabolic Panel:  Recent Labs Lab 02/06/14 1900 02/07/14 0034 02/07/14 0745 02/08/14 0610  NA 139 137 136* 136*  K 3.1* 2.9* 3.4* 3.0*  CL 94* 94* 96 97  CO2 29 31 27 28   GLUCOSE 106* 191* 222* 217*  BUN 23 23 20 17   CREATININE 1.18* 1.13* 0.96 0.82  CALCIUM 8.5 8.2* 7.8* 8.0*  MG  --   --  1.7  --    Liver Function Tests:  Recent Labs Lab 02/07/14 0745  AST 24  ALT 21  ALKPHOS 56  BILITOT 0.3  PROT 6.7  ALBUMIN 2.9*   No results found for this basename: LIPASE, AMYLASE,  in the last 168 hours No results found for this basename: AMMONIA,  in the last 168 hours CBC:  Recent Labs Lab 02/06/14 1900 02/07/14 0745 02/08/14 0610  WBC 5.6 4.7 4.6  NEUTROABS 2.3  --   --   HGB 13.1 12.1 11.5*  HCT 39.5 37.3 35.8*  MCV 92.5 94.2 93.2  PLT 116* 98* 93*   Cardiac Enzymes: No results found for this basename: CKTOTAL, CKMB, CKMBINDEX, TROPONINI,  in the last 168 hours BNP: BNP (last 3 results) No results found for this basename: PROBNP,  in the last 8760 hours CBG:  Recent Labs Lab 02/07/14 0418 02/07/14 1145 02/07/14 1705 02/07/14 2123 02/08/14 0621  GLUCAP 195* 249* 222* 262* 210*    Time coordinating discharge: 35 minutes  Signed:  Antionne Enrique  [facility] 02/08/2014, 8:56 AM

## 2014-02-08 NOTE — Care Management Note (Signed)
CARE MANAGEMENT NOTE 02/08/2014  Patient:  [name], [name]   Account Number:  [id number]  Date Initiated:  02/08/2014  Documentation initiated by:  Ricki Miller  Subjective/Objective Assessment:   53 yr old female admitted with laceration of right great toe.     Action/Plan:   Case manager spoke with patient concerning home health needs at discharge. Choice offered. Patient states she uses [facility]. Referral called to Pearletha Forge, [facility] liaison. Patient has walker and care providers at home.will need ambulance.   Anticipated DC Date:  02/08/2014   Anticipated DC Plan:  [place]  In-house referral  Clinical Social Worker      DC Planning Services  CM consult      [facility] Choice  HOME HEALTH   Choice offered to / List presented to:  C-1 Patient   DME arranged  NA        [place] arranged  HH-1 RN  [place]      [place].   Status of service:  Completed, signed off Medicare Important Message given?   (If response is "NO", the following Medicare IM given date fields will be blank) Date Medicare IM given:   Medicare IM given by:   Date Additional Medicare IM given:   Additional Medicare IM given by:    Discharge Disposition:  [place]  Per UR Regulation:  Reviewed for med. necessity/level of care/duration of stay

## 2014-02-08 NOTE — Progress Notes (Signed)
Nutrition Brief Note  Reason for assessment: MD Consult for diet education  Pt with discharge summary. Spoke with MD, Janece Canterbury, regarding consult for diet education. Per MD, consult to be cancelled. If any nutrition issues arise prior to time of pt discharge, please consult RD.  Kallie Locks, MS, Provisional LDN Pager # [phone] After hours/ weekend pager # [phone]

## 2014-02-08 NOTE — Progress Notes (Signed)
Note/chart reviewed.  [name], RD, LDN Pager #: [phone] After-Hours Pager #: [phone]  

## 2014-02-09 LAB — GLUCOSE, CAPILLARY: Glucose-Capillary: 246 mg/dL — ABNORMAL HIGH (ref 70–99)

## 2014-02-09 NOTE — ED Provider Notes (Signed)
Medical screening examination/treatment/procedure(s) were conducted as a shared visit with non-physician practitioner(s) and myself.  I personally evaluated the patient during the encounter. 53yo F, c/o right great toe injury while getting out of a chair today. VSS, trach in place, RRR, +right great toe open fx. Concern regarding pt's ability to care for wound and ambulate at home alone. Td update, IV abx. Ortho consult, medicine admit.      Francine Graven, DO 02/09/14 1725

## 2014-05-13 ENCOUNTER — Other Ambulatory Visit: Payer: Self-pay | Admitting: Neurology

## 2014-05-13 MED ORDER — GABAPENTIN 100 MG PO CAPS: 100.0000 mg | ORAL_CAPSULE | ORAL | Status: DC

## 2014-05-13 MED ORDER — GABAPENTIN 100 MG PO CAPS: ORAL_CAPSULE | ORAL | Status: DC

## 2014-05-13 NOTE — Telephone Encounter (Signed)
Rx sent in

## 2014-05-18 ENCOUNTER — Emergency Department (HOSPITAL_COMMUNITY): Payer: Medicare Other

## 2014-05-18 ENCOUNTER — Encounter (HOSPITAL_COMMUNITY): Payer: Self-pay

## 2014-05-18 ENCOUNTER — Emergency Department (HOSPITAL_COMMUNITY)
Admission: EM | Admit: 2014-05-18 | Discharge: 2014-05-18 | Disposition: A | Discharge status: Home / Self Care | Payer: Medicare Other | Attending: Emergency Medicine | Admitting: Emergency Medicine

## 2014-05-18 DIAGNOSIS — R110 Nausea: Secondary | ICD-10-CM | POA: Diagnosis not present

## 2014-05-18 DIAGNOSIS — Z8701 Personal history of pneumonia (recurrent): Secondary | ICD-10-CM | POA: Insufficient documentation

## 2014-05-18 DIAGNOSIS — I509 Heart failure, unspecified: Secondary | ICD-10-CM | POA: Insufficient documentation

## 2014-05-18 DIAGNOSIS — M109 Gout, unspecified: Secondary | ICD-10-CM | POA: Diagnosis not present

## 2014-05-18 DIAGNOSIS — R05 Cough: Secondary | ICD-10-CM | POA: Insufficient documentation

## 2014-05-18 DIAGNOSIS — Z862 Personal history of diseases of the blood and blood-forming organs and certain disorders involving the immune mechanism: Secondary | ICD-10-CM | POA: Diagnosis not present

## 2014-05-18 DIAGNOSIS — Z8619 Personal history of other infectious and parasitic diseases: Secondary | ICD-10-CM | POA: Diagnosis not present

## 2014-05-18 DIAGNOSIS — Z86718 Personal history of other venous thrombosis and embolism: Secondary | ICD-10-CM | POA: Diagnosis not present

## 2014-05-18 DIAGNOSIS — R509 Fever, unspecified: Secondary | ICD-10-CM

## 2014-05-18 DIAGNOSIS — E119 Type 2 diabetes mellitus without complications: Secondary | ICD-10-CM | POA: Diagnosis not present

## 2014-05-18 DIAGNOSIS — R0602 Shortness of breath: Secondary | ICD-10-CM

## 2014-05-18 DIAGNOSIS — R531 Weakness: Secondary | ICD-10-CM | POA: Diagnosis not present

## 2014-05-18 DIAGNOSIS — K219 Gastro-esophageal reflux disease without esophagitis: Secondary | ICD-10-CM | POA: Insufficient documentation

## 2014-05-18 DIAGNOSIS — M1990 Unspecified osteoarthritis, unspecified site: Secondary | ICD-10-CM | POA: Insufficient documentation

## 2014-05-18 DIAGNOSIS — Z880 Allergy status to penicillin: Secondary | ICD-10-CM | POA: Diagnosis not present

## 2014-05-18 DIAGNOSIS — R058 Other specified cough: Secondary | ICD-10-CM

## 2014-05-18 DIAGNOSIS — G629 Polyneuropathy, unspecified: Secondary | ICD-10-CM | POA: Diagnosis not present

## 2014-05-18 DIAGNOSIS — N189 Chronic kidney disease, unspecified: Secondary | ICD-10-CM | POA: Diagnosis not present

## 2014-05-18 DIAGNOSIS — F329 Major depressive disorder, single episode, unspecified: Secondary | ICD-10-CM | POA: Diagnosis not present

## 2014-05-18 DIAGNOSIS — J45901 Unspecified asthma with (acute) exacerbation: Secondary | ICD-10-CM | POA: Diagnosis not present

## 2014-05-18 DIAGNOSIS — M791 Myalgia: Secondary | ICD-10-CM | POA: Insufficient documentation

## 2014-05-18 DIAGNOSIS — R51 Headache: Secondary | ICD-10-CM | POA: Diagnosis not present

## 2014-05-18 DIAGNOSIS — F419 Anxiety disorder, unspecified: Secondary | ICD-10-CM | POA: Insufficient documentation

## 2014-05-18 DIAGNOSIS — R609 Edema, unspecified: Secondary | ICD-10-CM | POA: Insufficient documentation

## 2014-05-18 DIAGNOSIS — E785 Hyperlipidemia, unspecified: Secondary | ICD-10-CM | POA: Diagnosis not present

## 2014-05-18 DIAGNOSIS — Z79899 Other long term (current) drug therapy: Secondary | ICD-10-CM | POA: Diagnosis not present

## 2014-05-18 LAB — CBC WITH DIFFERENTIAL/PLATELET
Basophils Absolute: 0 10*3/uL (ref 0.0–0.1)
Basophils Relative: 0 % (ref 0–1)
Eosinophils Absolute: 0 10*3/uL (ref 0.0–0.7)
Eosinophils Relative: 0 % (ref 0–5)
HCT: 38.8 % (ref 36.0–46.0)
Hemoglobin: 12.6 g/dL (ref 12.0–15.0)
Lymphocytes Relative: 10 % — ABNORMAL LOW (ref 12–46)
Lymphs Abs: 1 10*3/uL (ref 0.7–4.0)
MCH: 30.7 pg (ref 26.0–34.0)
MCHC: 32.5 g/dL (ref 30.0–36.0)
MCV: 94.6 fL (ref 78.0–100.0)
Monocytes Absolute: 1.1 10*3/uL — ABNORMAL HIGH (ref 0.1–1.0)
Monocytes Relative: 11 % (ref 3–12)
Neutro Abs: 8 10*3/uL — ABNORMAL HIGH (ref 1.7–7.7)
Neutrophils Relative %: 79 % — ABNORMAL HIGH (ref 43–77)
Platelets: 85 10*3/uL — ABNORMAL LOW (ref 150–400)
RBC: 4.1 MIL/uL (ref 3.87–5.11)
RDW: 14.8 % (ref 11.5–15.5)
WBC: 10.2 10*3/uL (ref 4.0–10.5)

## 2014-05-18 LAB — URINALYSIS, ROUTINE W REFLEX MICROSCOPIC
Bilirubin Urine: NEGATIVE
Glucose, UA: >1000 mg/dL — AB
Ketones, ur: NEGATIVE mg/dL
Leukocytes, UA: NEGATIVE
Nitrite: NEGATIVE
Protein, ur: NEGATIVE mg/dL
Specific Gravity, Urine: 1.031 — ABNORMAL HIGH (ref 1.005–1.030)
Urobilinogen, UA: 0.2 mg/dL (ref 0.0–1.0)
pH: 6 (ref 5.0–8.0)

## 2014-05-18 LAB — BASIC METABOLIC PANEL
Anion gap: 14 (ref 5–15)
BUN: 14 mg/dL (ref 6–23)
CO2: 25 mEq/L (ref 19–32)
Calcium: 8.5 mg/dL (ref 8.4–10.5)
Chloride: 96 mEq/L (ref 96–112)
Creatinine, Ser: 0.96 mg/dL (ref 0.50–1.10)
GFR calc Af Amer: 77 mL/min — ABNORMAL LOW (ref >90)
GFR calc non Af Amer: 67 mL/min — ABNORMAL LOW (ref >90)
Glucose, Bld: 290 mg/dL — ABNORMAL HIGH (ref 70–99)
Potassium: 4.1 mEq/L (ref 3.7–5.3)
Sodium: 135 mEq/L — ABNORMAL LOW (ref 137–147)

## 2014-05-18 LAB — PRO B NATRIURETIC PEPTIDE: Pro B Natriuretic peptide (BNP): 927.1 pg/mL — ABNORMAL HIGH (ref 0–125)

## 2014-05-18 LAB — I-STAT TROPONIN, ED: Troponin i, poc: 0 ng/mL (ref 0.00–0.08)

## 2014-05-18 LAB — URINE MICROSCOPIC-ADD ON

## 2014-05-18 MED ORDER — KETOROLAC TROMETHAMINE 30 MG/ML IJ SOLN
30.0000 mg | Freq: Once | INTRAMUSCULAR | Status: AC
  Administered 2014-05-18: 30 mg via INTRAVENOUS
  Filled 2014-05-18: qty 1

## 2014-05-18 MED ORDER — ONDANSETRON HCL 4 MG PO TABS: 4.0000 mg | ORAL_TABLET | Freq: Three times a day (TID) | ORAL | Status: DC | PRN

## 2014-05-18 NOTE — ED Notes (Signed)
Per EMS, Patient started to have cough, fever, and generalized weakness. Cough is productive with yellow sputum. Patient had a fever of 101.2 at home. Patient has a history of CHF and currently has a trachestomy. Patient alert and oriented x4. Vitals per EMS: 118/68, 96 HR, 24 RR, 96 % on 4L.

## 2014-05-18 NOTE — ED Notes (Signed)
Patient asked to stay in gown because she is hot. Patient affirmed that is okay. Patient waiting on transportation.

## 2014-05-18 NOTE — ED Provider Notes (Signed)
CSN: [id number]     Arrival date & time 05/18/14  1156 History   First MD Initiated Contact with Patient 05/18/14 1237     Chief Complaint  Patient presents with  . Cough  . Fatigue  . Fever     (Consider location/radiation/quality/duration/timing/severity/associated sxs/prior Treatment) HPI Pt is a morbidly obese female with hx of OSA for which she has a tracheostomy tube in place, DM, asthma, dyslipidemia, hepatosplenomegaly, thrombocytopenia, GERD, polyneuropathy, urinary incontinence, CHF, hyperlipidemia, leg swelling, and DVT presenting to ED via EMS with reports of productive cough with yellow sputum, fever, myalgias, and generalized weakness that started yesterday. Tmax 101.2 at home.  Pt states she has a home health nurse that comes to help, started feeling bad last night.  Pt also c/o chest tightness and SOB but denies increase in leg swelling. States she thinks she may have a UTI as this is how she felt last time.  Denies sick contacts or recent travel. Denies chest pain or abdominal pain. Pt does have nausea w/o vomiting or diarrhea.   Past Medical History  Diagnosis Date  . OSA (obstructive sleep apnea)   . Morbid obesity   . Depressive disorder, not elsewhere classified   . Diabetes mellitus   . Asthma   . Dyslipidemia   . Clostridium difficile enterocolitis 2012  . Splenomegaly 06/01/2011  . Thrombocytopenia 06/01/2011  . Hepatosplenomegaly   . Gout   . GERD (gastroesophageal reflux disease)   . Osteoarthritis   . Polyneuropathy   . Urinary incontinence   . Amenorrhea   . CHF (congestive heart failure)   . Hyperlipidemia   . Leg swelling   . Nausea   . Weakness   . Palpitations   . PONV (postoperative nausea and vomiting)   . Anginal pain     no chest pain in years  . Dysrhythmia     heart palpations  . Anxiety   . Pneumonia   . Shortness of breath   . Chronic kidney disease     overactive bladder  . Peripheral neuropathy   . DVT (deep venous thrombosis)     Past Surgical History  Procedure Laterality Date  . Cholecystectomy    . Tracheostomy  2001  . Endometrial ablation    . Skin graft    . Finger surgery      ring finger on right hand  . Esophageal dilation  2003  . Breast biopsy      needle core, left  . Dilation and curettage of uterus      times 2  . Tracheal dilitation  01/24/2012    Procedure: TRACHEAL DILITATION;  Surgeon: Melissa Montane, MD;  Location: [place];  Service: ENT;  Laterality: N/A;  Trache change with possible dilation, from size 6 to size 7 uncuffed  . Amputation Right 11/19/2013    Procedure: AMPUTATION DIGIT;  Surgeon: Cammie Sickle, MD;  Location: [place];  Service: Orthopedics;  Laterality: Right;  Partial amputation right long finger, Debridement right ring finger    Family History  Problem Relation Age of Onset  . Diabetes Father   . Hypertension Father   . Dementia Father     Deceased, 39  . Pneumonia Father   . Heart disease Mother   . Clotting disorder Mother   . Hypertension Mother   . Heart attack Mother     Deceased, 59  . Multiple myeloma Paternal Grandmother   . Cancer Paternal Grandmother  multiple myoloma  . Heart disease Paternal Grandfather   . Kidney disease Maternal Grandmother   . Hypertension Sister   . Cancer Paternal Aunt     breast  . Cancer Cousin     breast - all three   History  Substance Use Topics  . Smoking status: Never Smoker   . Smokeless tobacco: Never Used  . Alcohol Use: No     Comment: occasional - once or twice a year   OB History    Gravida Para Term Preterm AB TAB SAB Ectopic Multiple Living   0              Review of Systems  Constitutional: Positive for fever (Tmax 101.2), chills and appetite change. Negative for diaphoresis.  HENT: Positive for congestion. Negative for sore throat.   Respiratory: Positive for cough, chest tightness and shortness of breath.   Cardiovascular: Negative for chest pain, palpitations and leg  swelling ( "no more than usual").  Gastrointestinal: Positive for nausea. Negative for vomiting, abdominal pain, diarrhea and constipation.  Genitourinary: Positive for frequency. Negative for dysuria, urgency, hematuria, flank pain and pelvic pain.  Musculoskeletal: Positive for myalgias and back pain.  Neurological: Positive for weakness and headaches. Negative for dizziness and light-headedness.  All other systems reviewed and are negative.     Allergies  Molds & smuts; Nitroglycerin er; Accolate; Allopurinol; Amoxicillin-pot clavulanate; Ciprofloxacin; Codeine; Doxycycline; Fexofenadine; Keflex; Morphine; Sulfa drugs cross reactors; and Sulfonamide derivatives  Home Medications   Prior to Admission medications   Medication Sig Start Date End Date Taking? Authorizing Provider  acetaminophen (TYLENOL) 500 MG tablet Take 1,000 mg by mouth every 6 (six) hours as needed for mild pain. For pain    Historical Provider, MD  albuterol (PROVENTIL) (2.5 MG/3ML) 0.083% nebulizer solution Take 2.5 mg by nebulization every 4 (four) hours as needed. For shortness of breath 05/28/11   Chesley Mires, MD  ALPRAZolam Duanne Moron) 0.5 MG tablet Take 0.5 mg by mouth 2 (two) times daily as needed. For anxiety    Historical Provider, MD  atorvastatin (LIPITOR) 10 MG tablet Take 10 mg by mouth at bedtime.     Historical Provider, MD  B Complex Vitamins (VITAMIN B COMPLEX PO) Take 1 tablet by mouth daily.     Historical Provider, MD  budesonide (PULMICORT) 0.25 MG/2ML nebulizer solution Take 0.25 mg by nebulization 2 (two) times daily as needed (shortness of breath).    Historical Provider, MD  bumetanide (BUMEX) 2 MG tablet Take 1 tablet (2 mg total) by mouth daily. 10/02/13   Jettie Booze, MD  busPIRone (BUSPAR) 5 MG tablet Take 7.5 mg by mouth 2 (two) times daily.     Historical Provider, MD  Canagliflozin (INVOKANA) 100 MG TABS Take 100 mg by mouth daily.     Historical Provider, MD  cholestyramine Lucrezia Starch)  4 G packet Take 1 packet by mouth as needed. For loose stools, IBS.    Historical Provider, MD  clotrimazole (LOTRIMIN) 1 % cream Apply 1 application topically daily. To stomach    Historical Provider, MD  colchicine 0.6 MG tablet Take 0.6 mg by mouth daily.    Historical Provider, MD  DULoxetine (CYMBALTA) 30 MG capsule Take 1 capsule (30 mg total) by mouth 2 (two) times daily. 06/09/13   Donika Keith Rake, DO  ergocalciferol (VITAMIN D2) 50000 UNITS capsule Take 50,000 Units by mouth once a week. Take on Fridays    Historical Provider, MD  Febuxostat (ULORIC) 80 MG TABS  Take 80 mg by mouth at bedtime.     Historical Provider, MD  fesoterodine (TOVIAZ) 4 MG TB24 Take 4 mg by mouth daily.      Historical Provider, MD  fish oil-omega-3 fatty acids 1000 MG capsule Take 1 g by mouth 2 (two) times daily.     Historical Provider, MD  gabapentin (NEURONTIN) 100 MG capsule Take 1 capsule (100 mg total) by mouth See admin instructions. Take one tablet in the morning, 2 tablets in the afternoon, and 2 tablets at bedtime 05/13/14   Donika K Patel, DO  gabapentin (NEURONTIN) 100 MG capsule TAKE ONE CAPSULE BY MOUTH EVERY MORNING AND 2 CAPS IN THE AFTERNOON AND 2 CAPS AT BEDTIME 05/13/14   Donika K Patel, DO  guaiFENesin (MUCINEX) 600 MG 12 hr tablet Take 600 mg by mouth 2 (two) times daily.     Historical Provider, MD  HYDROcodone-acetaminophen (NORCO/VICODIN) 5-325 MG per tablet Take 1 tablet by mouth every 6 (six) hours as needed for moderate pain.     Historical Provider, MD  insulin regular human CONCENTRATED (HUMULIN R) 500 UNIT/ML SOLN injection Inject 28 Units into the skin 2 (two) times daily before a meal.     Historical Provider, MD  ipratropium (ATROVENT) 0.02 % nebulizer solution Take 0.5 mg by nebulization 4 (four) times daily as needed for wheezing or shortness of breath.    Historical Provider, MD  Liraglutide (VICTOZA) 18 MG/3ML SOLN injection Inject 1.8 mg into the skin daily.    Historical  Provider, MD  loratadine (CLARITIN) 10 MG tablet Take 10 mg by mouth daily as needed. For allergies    Historical Provider, MD  losartan (COZAAR) 100 MG tablet Take 1 tablet ($RemoveB'100mg'[id number]$ ) by mouth daily. 10/02/13   Jettie Booze, MD  meclizine (ANTIVERT) 25 MG tablet Take 25 mg by mouth at bedtime.     Historical Provider, MD  metolazone (ZAROXOLYN) 5 MG tablet Take 1 tablet (5 mg total) by mouth See admin instructions. 3 times weekly as needed for edema 08/05/13   Jettie Booze, MD  Multiple Vitamin (MULTIVITAMIN) capsule Take 1 capsule by mouth daily.      Historical Provider, MD  nebivolol (BYSTOLIC) 10 MG tablet Take 10 mg by mouth daily.    Historical Provider, MD  omeprazole (PRILOSEC) 40 MG capsule Take 40 mg by mouth 2 (two) times daily.     Historical Provider, MD  ondansetron (ZOFRAN) 4 MG tablet Take 1 tablet (4 mg total) by mouth every 8 (eight) hours as needed for nausea. 05/18/14   Noland Fordyce, PA-C  oxyCODONE-acetaminophen (PERCOCET/ROXICET) 5-325 MG per tablet Take 1-2 tablets by mouth every 6 (six) hours as needed for moderate pain or severe pain. 12/30/13   [name], PA-C  potassium chloride SA (K-DUR,KLOR-CON) 20 MEQ tablet Take 20 mEq by mouth 3 (three) times daily. 1 in AM, 2 at lunch, 1 at supper and 2 at bedtime    Historical Provider, MD  primidone (MYSOLINE) 50 MG tablet Take 50 mg by mouth 2 (two) times daily. Take one tablet twice daily 06/09/13   Alda Berthold, DO  Probiotic Product (PROBIOTIC PO) Take 1 tablet by mouth daily.     Historical Provider, MD  warfarin (COUMADIN) 5 MG tablet Take 5 mg by mouth daily.     Historical Provider, MD   BP 103/51 mmHg  Pulse 80  Temp(Src) 99.1 F (37.3 C) (Oral)  Resp 19  SpO2 91% Physical Exam  Constitutional: She appears  well-developed and well-nourished. No distress.  Morbidly obese female with tracheostomy tube in place, NAD.  HENT:  Head: Normocephalic and atraumatic.  Eyes: Conjunctivae are normal. No scleral  icterus.  Neck: Normal range of motion.  Cardiovascular: Normal rate, regular rhythm and normal heart sounds.   Pulmonary/Chest: Effort normal and breath sounds normal. No respiratory distress. She has no wheezes. She has no rales. She exhibits no tenderness.  Tracheostomy tube in place. No respiratory distress. Decreased lung sounds throughout, exam limited due to body habitus.   Abdominal: Soft. Bowel sounds are normal. She exhibits no distension and no mass. There is no tenderness. There is no rebound and no guarding.  Soft, non-tender.  Musculoskeletal: Normal range of motion. She exhibits edema.  2+ pitting edema, bilateral lower extremities.   Neurological: She is alert.  Skin: Skin is warm and dry. She is not diaphoretic.  Hyperpigmented skin in LE bilaterally.   Nursing note and vitals reviewed.   ED Course  Procedures (including critical care time) Labs Review Labs Reviewed  CBC WITH DIFFERENTIAL - Abnormal; Notable for the following:    Platelets 85 (*)    Neutrophils Relative % 79 (*)    Neutro Abs 8.0 (*)    Lymphocytes Relative 10 (*)    Monocytes Absolute 1.1 (*)    All other components within normal limits  BASIC METABOLIC PANEL - Abnormal; Notable for the following:    Sodium 135 (*)    Glucose, Bld 290 (*)    GFR calc non Af Amer 67 (*)    GFR calc Af Amer 77 (*)    All other components within normal limits  PRO B NATRIURETIC PEPTIDE - Abnormal; Notable for the following:    Pro B Natriuretic peptide (BNP) 927.1 (*)    All other components within normal limits  URINALYSIS, ROUTINE W REFLEX MICROSCOPIC - Abnormal; Notable for the following:    Specific Gravity, Urine 1.031 (*)    Glucose, UA >1000 (*)    Hgb urine dipstick TRACE (*)    All other components within normal limits  URINE MICROSCOPIC-ADD ON - Abnormal; Notable for the following:    Squamous Epithelial / LPF FEW (*)    All other components within normal limits  URINE CULTURE  Randolm Idol, ED     Imaging Review Dg Chest Port 1 View  05/18/2014   CLINICAL DATA:  53 year old female with shortness of breath wheezing for the past 2 days.  EXAM: PORTABLE CHEST - 1 VIEW  COMPARISON:  Prior chest x-ray 08/01/2013  FINDINGS: Tracheostomy tube remains in stable position with the tip midline at the level of the clavicles. Stable cardiomegaly. Mildly increased pulmonary vascular congestion without overt edema. No focal airspace consolidation, pneumothorax or large effusion. No acute osseous abnormality.  IMPRESSION: 1. Slightly increased pulmonary vascular congestion without overt edema. 2. Stable cardiomegaly. 3. Stable position of tracheostomy tube.   Electronically Signed   By: Jacqulynn Cadet [name]   On: 05/18/2014 13:50     EKG Interpretation None      MDM   Final diagnoses:  SOB (shortness of breath)  Fever and chills  Productive cough    Pt is a 53yo female c/o fever, productive cough and myalgias that started last night. Pt is chronically ill with tracheostomy, hx of CHF.  Temp of 99.1 in ED.  Pt is non-toxic appearing.  Labs: consistent with previous. CXR: slight increased pulmonary vascular congestion w/o overt edema, otherwise unremarkable. Discussed pt with Dr. Wilson Singer,  pt may be discharged home to f/u with PCP.  No emergent process taking place at this time, including but not limited to: ACS, CHF exacerbation, pneumonia, UTI/pyelonephrosis.  Will tx as URI. Discussed use of antibiotics with Dr. Wilson Singer, will refrain for now. Advised to take acetaminophen and ibuprofen as needed for fever and pain. Advised to f/u with PCP in 2-3 days for recheck of symptoms. Return precautions provided. Pt verbalized understanding and agreement with tx plan.   Noland Fordyce, PA-C 05/18/14 1634  Virgel Manifold, MD 05/19/14 [phone]

## 2014-05-18 NOTE — ED Notes (Signed)
Patient waiting on PTAR. Gave report to [place], Therapist, sports

## 2014-05-20 LAB — URINE CULTURE: Colony Count: >=100000

## 2014-05-21 ENCOUNTER — Telehealth (HOSPITAL_BASED_OUTPATIENT_CLINIC_OR_DEPARTMENT_OTHER): Payer: Self-pay | Admitting: Emergency Medicine

## 2014-05-21 NOTE — Telephone Encounter (Signed)
Post ED Visit - Positive Culture Follow-up: Successful Patient Follow-Up  Culture assessed and recommendations reviewed by: []  Wes [place], Pharm.D., BCPS []  Heide Guile, Pharm.D., BCPS []  Alycia Rossetti, Pharm.D., BCPS []  [place], [place].D., BCPS, AAHIVP [x]  Legrand Como, Pharm.D., BCPS, AAHIVP []  Hassie Bruce, Pharm.D. []  Milus Glazier, Pharm.D.  Positive urine culture Klebsiella  [x]  Patient discharged without antimicrobial prescription and treatment is now indicated []  Organism is resistant to prescribed ED discharge antimicrobial []  Patient with positive blood cultures  Changes discussed with ED provider: Kenton Kingfisher PA New antibiotic prescription was prescribed macrobid but pt deferred to keflex prescribed by another care provider Called to n/a  Contacted patient, date 05/21/14  1425, pt declined need for macrobid @ present, states had antibiotic prescribed by another care provider   Hazle Nordmann 05/21/2014, 2:27 PM

## 2014-05-21 NOTE — Progress Notes (Signed)
ED Antimicrobial Stewardship Positive Culture Follow Up   [name] is an 53 y.o. female who presented to [facility] on 05/18/2014 with a chief complaint of  Chief Complaint  Patient presents with  . Cough  . Fatigue  . Fever    Recent Results (from the past 720 hour(s))  Urine culture     Status: None   Collection Time: 05/18/14  1:06 PM  Result Value Ref Range Status   Specimen Description URINE, CLEAN CATCH  Final   Special Requests NONE  Final   Culture  Setup Time   Final    05/18/2014 13:50 Performed at [place]   Final    >=100,000 COLONIES/ML Performed at [place] Performed at [facility]    Report Status 05/20/2014 FINAL  Final   Organism ID, Bacteria KLEBSIELLA PNEUMONIAE  Final      Susceptibility   Klebsiella pneumoniae - MIC*    AMPICILLIN RESISTANT      CEFAZOLIN <=4 SENSITIVE Sensitive     CEFTRIAXONE <=1 SENSITIVE Sensitive     CIPROFLOXACIN <=0.25 SENSITIVE Sensitive     GENTAMICIN <=1 SENSITIVE Sensitive     LEVOFLOXACIN <=0.12 SENSITIVE Sensitive     NITROFURANTOIN 32 SENSITIVE Sensitive     TOBRAMYCIN <=1 SENSITIVE Sensitive     TRIMETH/SULFA <=20 SENSITIVE Sensitive     PIP/TAZO <=4 SENSITIVE Sensitive     * KLEBSIELLA PNEUMONIAE    [x]  Patient discharged originally without antimicrobial agent and treatment is now indicated  New antibiotic prescription: Macrobid 100mg  PO BID x 5 days  ED Provider: Margarita Mail, PA-C   Norva Riffle 05/21/2014, 12:05 PM Infectious Diseases Pharmacist Phone# [phone]

## 2014-06-01 ENCOUNTER — Other Ambulatory Visit: Payer: Self-pay | Admitting: Neurology

## 2014-06-01 NOTE — Telephone Encounter (Signed)
Rx sent 

## 2014-06-02 ENCOUNTER — Encounter (HOSPITAL_BASED_OUTPATIENT_CLINIC_OR_DEPARTMENT_OTHER): Payer: Self-pay | Admitting: *Deleted

## 2014-06-02 ENCOUNTER — Other Ambulatory Visit: Payer: Self-pay | Admitting: Orthopedic Surgery

## 2014-06-02 NOTE — Progress Notes (Signed)
Pt was here 5/15-reviewed notes with dr crews

## 2014-06-03 ENCOUNTER — Ambulatory Visit (HOSPITAL_BASED_OUTPATIENT_CLINIC_OR_DEPARTMENT_OTHER): Payer: Medicare Other | Admitting: Anesthesiology

## 2014-06-03 ENCOUNTER — Ambulatory Visit (HOSPITAL_BASED_OUTPATIENT_CLINIC_OR_DEPARTMENT_OTHER)
Admission: RE | Admit: 2014-06-03 | Discharge: 2014-06-03 | Disposition: A | Discharge status: Home / Self Care | Payer: Medicare Other | Source: Ambulatory Visit | Attending: Orthopedic Surgery | Admitting: Orthopedic Surgery

## 2014-06-03 ENCOUNTER — Encounter (HOSPITAL_BASED_OUTPATIENT_CLINIC_OR_DEPARTMENT_OTHER): Payer: Self-pay | Admitting: Orthopedic Surgery

## 2014-06-03 ENCOUNTER — Encounter (HOSPITAL_BASED_OUTPATIENT_CLINIC_OR_DEPARTMENT_OTHER)
Admission: RE | Disposition: A | Discharge status: Home / Self Care | Payer: Self-pay | Source: Ambulatory Visit | Attending: Orthopedic Surgery

## 2014-06-03 DIAGNOSIS — G4733 Obstructive sleep apnea (adult) (pediatric): Secondary | ICD-10-CM | POA: Insufficient documentation

## 2014-06-03 DIAGNOSIS — I129 Hypertensive chronic kidney disease with stage 1 through stage 4 chronic kidney disease, or unspecified chronic kidney disease: Secondary | ICD-10-CM | POA: Insufficient documentation

## 2014-06-03 DIAGNOSIS — K219 Gastro-esophageal reflux disease without esophagitis: Secondary | ICD-10-CM | POA: Diagnosis not present

## 2014-06-03 DIAGNOSIS — M868X4 Other osteomyelitis, hand: Secondary | ICD-10-CM | POA: Diagnosis present

## 2014-06-03 DIAGNOSIS — M109 Gout, unspecified: Secondary | ICD-10-CM | POA: Insufficient documentation

## 2014-06-03 DIAGNOSIS — E119 Type 2 diabetes mellitus without complications: Secondary | ICD-10-CM | POA: Insufficient documentation

## 2014-06-03 DIAGNOSIS — Z930 Tracheostomy status: Secondary | ICD-10-CM | POA: Insufficient documentation

## 2014-06-03 DIAGNOSIS — Z833 Family history of diabetes mellitus: Secondary | ICD-10-CM | POA: Insufficient documentation

## 2014-06-03 DIAGNOSIS — N189 Chronic kidney disease, unspecified: Secondary | ICD-10-CM | POA: Diagnosis not present

## 2014-06-03 DIAGNOSIS — Z6841 Body Mass Index (BMI) 40.0 and over, adult: Secondary | ICD-10-CM | POA: Diagnosis not present

## 2014-06-03 DIAGNOSIS — Z841 Family history of disorders of kidney and ureter: Secondary | ICD-10-CM | POA: Diagnosis not present

## 2014-06-03 DIAGNOSIS — J45909 Unspecified asthma, uncomplicated: Secondary | ICD-10-CM | POA: Insufficient documentation

## 2014-06-03 DIAGNOSIS — K589 Irritable bowel syndrome without diarrhea: Secondary | ICD-10-CM | POA: Diagnosis not present

## 2014-06-03 DIAGNOSIS — Z86718 Personal history of other venous thrombosis and embolism: Secondary | ICD-10-CM | POA: Insufficient documentation

## 2014-06-03 DIAGNOSIS — F329 Major depressive disorder, single episode, unspecified: Secondary | ICD-10-CM | POA: Insufficient documentation

## 2014-06-03 DIAGNOSIS — M1990 Unspecified osteoarthritis, unspecified site: Secondary | ICD-10-CM | POA: Insufficient documentation

## 2014-06-03 DIAGNOSIS — Z8249 Family history of ischemic heart disease and other diseases of the circulatory system: Secondary | ICD-10-CM | POA: Diagnosis not present

## 2014-06-03 DIAGNOSIS — I509 Heart failure, unspecified: Secondary | ICD-10-CM | POA: Diagnosis not present

## 2014-06-03 DIAGNOSIS — G629 Polyneuropathy, unspecified: Secondary | ICD-10-CM | POA: Diagnosis not present

## 2014-06-03 DIAGNOSIS — Z794 Long term (current) use of insulin: Secondary | ICD-10-CM | POA: Diagnosis not present

## 2014-06-03 DIAGNOSIS — G43909 Migraine, unspecified, not intractable, without status migrainosus: Secondary | ICD-10-CM | POA: Diagnosis not present

## 2014-06-03 DIAGNOSIS — E785 Hyperlipidemia, unspecified: Secondary | ICD-10-CM | POA: Diagnosis not present

## 2014-06-03 HISTORY — PX: AMPUTATION: SHX166

## 2014-06-03 LAB — GLUCOSE, CAPILLARY: Glucose-Capillary: 137 mg/dL — ABNORMAL HIGH (ref 70–99)

## 2014-06-03 SURGERY — AMPUTATION DIGIT
Anesthesia: Monitor Anesthesia Care | Site: Finger | Laterality: Right

## 2014-06-03 MED ORDER — MIDAZOLAM HCL 2 MG/2ML IJ SOLN: 1.0000 mg | INTRAMUSCULAR | Status: DC | PRN

## 2014-06-03 MED ORDER — LACTATED RINGERS IV SOLN
INTRAVENOUS | Status: DC
  Administered 2014-06-03: 13:00:00 via INTRAVENOUS

## 2014-06-03 MED ORDER — BUPIVACAINE HCL (PF) 0.25 % IJ SOLN
INTRAMUSCULAR | Status: DC | PRN
  Administered 2014-06-03: 5 mL

## 2014-06-03 MED ORDER — VANCOMYCIN HCL IN DEXTROSE 500-5 MG/100ML-% IV SOLN
INTRAVENOUS | Status: AC
  Filled 2014-06-03: qty 100

## 2014-06-03 MED ORDER — FENTANYL CITRATE 0.05 MG/ML IJ SOLN
INTRAMUSCULAR | Status: AC
  Filled 2014-06-03: qty 4

## 2014-06-03 MED ORDER — CHLORHEXIDINE GLUCONATE 4 % EX LIQD: 60.0000 mL | Freq: Once | CUTANEOUS | Status: DC

## 2014-06-03 MED ORDER — MIDAZOLAM HCL 2 MG/2ML IJ SOLN
INTRAMUSCULAR | Status: AC
  Filled 2014-06-03: qty 2

## 2014-06-03 MED ORDER — FENTANYL CITRATE 0.05 MG/ML IJ SOLN: 50.0000 ug | INTRAMUSCULAR | Status: DC | PRN

## 2014-06-03 MED ORDER — OXYCODONE-ACETAMINOPHEN 10-325 MG PO TABS: 1.0000 | ORAL_TABLET | ORAL | Status: DC | PRN

## 2014-06-03 MED ORDER — LIDOCAINE HCL (PF) 1 % IJ SOLN
INTRAMUSCULAR | Status: DC | PRN
  Administered 2014-06-03: 5 mL

## 2014-06-03 MED ORDER — VANCOMYCIN HCL 10 G IV SOLR
1500.0000 mg | INTRAVENOUS | Status: AC
  Administered 2014-06-03: 1500 mg via INTRAVENOUS

## 2014-06-03 MED ORDER — VANCOMYCIN HCL IN DEXTROSE 1-5 GM/200ML-% IV SOLN
INTRAVENOUS | Status: AC
  Filled 2014-06-03: qty 200

## 2014-06-03 MED ORDER — AMOXICILLIN-POT CLAVULANATE 875-125 MG PO TABS
1.0000 | ORAL_TABLET | Freq: Two times a day (BID) | ORAL | Status: DC
Start: 2014-06-03 — End: 2014-07-15

## 2014-06-03 MED ORDER — SODIUM CHLORIDE 0.9 % IV SOLN
1500.0000 mg | INTRAVENOUS | Status: DC
Start: 2014-06-04 — End: 2014-06-03

## 2014-06-03 MED ORDER — PROPOFOL 10 MG/ML IV EMUL
INTRAVENOUS | Status: AC
  Filled 2014-06-03: qty 50

## 2014-06-03 SURGICAL SUPPLY — 50 items
BLADE MINI RND TIP GREEN BEAV (BLADE) IMPLANT
BLADE OSC/SAG .038X5.5 CUT EDG (BLADE) IMPLANT
BLADE SURG 15 STRL LF DISP TIS (BLADE) ×1 IMPLANT
BLADE SURG 15 STRL SS (BLADE) ×2
BNDG COHESIVE 1X5 TAN STRL LF (GAUZE/BANDAGES/DRESSINGS) ×3 IMPLANT
BNDG ESMARK 4X9 LF (GAUZE/BANDAGES/DRESSINGS) IMPLANT
CHLORAPREP W/TINT 26ML (MISCELLANEOUS) ×3 IMPLANT
CORDS BIPOLAR (ELECTRODE) IMPLANT
COVER BACK TABLE 60X90IN (DRAPES) ×3 IMPLANT
COVER MAYO STAND STRL (DRAPES) ×3 IMPLANT
CUFF TOURNIQUET SINGLE 18IN (TOURNIQUET CUFF) IMPLANT
DECANTER SPIKE VIAL GLASS SM (MISCELLANEOUS) IMPLANT
DRAIN PENROSE 1/2X12 LTX STRL (WOUND CARE) ×3 IMPLANT
DRAIN PENROSE 1/4X12 LTX STRL (WOUND CARE) IMPLANT
DRAPE EXTREMITY T 121X128X90 (DRAPE) ×3 IMPLANT
DRAPE OEC MINIVIEW 54X84 (DRAPES) IMPLANT
DRAPE SURG 17X23 STRL (DRAPES) ×3 IMPLANT
GAUZE SPONGE 4X4 12PLY STRL (GAUZE/BANDAGES/DRESSINGS) ×3 IMPLANT
GAUZE XEROFORM 1X8 LF (GAUZE/BANDAGES/DRESSINGS) ×3 IMPLANT
GLOVE BIO SURGEON STRL SZ 6.5 (GLOVE) ×2 IMPLANT
GLOVE BIO SURGEONS STRL SZ 6.5 (GLOVE) ×1
GLOVE BIOGEL PI IND STRL 7.0 (GLOVE) ×1 IMPLANT
GLOVE BIOGEL PI IND STRL 8.5 (GLOVE) ×1 IMPLANT
GLOVE BIOGEL PI INDICATOR 7.0 (GLOVE) ×2
GLOVE BIOGEL PI INDICATOR 8.5 (GLOVE) ×2
GLOVE EXAM NITRILE PF MED BLUE (GLOVE) ×3 IMPLANT
GLOVE SURG ORTHO 8.0 STRL STRW (GLOVE) ×3 IMPLANT
GOWN STRL REUS W/ TWL LRG LVL3 (GOWN DISPOSABLE) ×1 IMPLANT
GOWN STRL REUS W/TWL LRG LVL3 (GOWN DISPOSABLE) ×2
GOWN STRL REUS W/TWL XL LVL3 (GOWN DISPOSABLE) ×3 IMPLANT
NS IRRIG 1000ML POUR BTL (IV SOLUTION) ×3 IMPLANT
PACK BASIN DAY SURGERY FS (CUSTOM PROCEDURE TRAY) ×3 IMPLANT
PADDING CAST ABS 4INX4YD NS (CAST SUPPLIES)
PADDING CAST ABS COTTON 4X4 ST (CAST SUPPLIES) IMPLANT
SPLINT FINGER 3.25 BULB 911905 (SOFTGOODS) ×3 IMPLANT
SPLINT FINGER 4.25 BULB 911906 (SOFTGOODS) IMPLANT
STOCKINETTE 4X48 STRL (DRAPES) ×3 IMPLANT
SUT CHROMIC 4 0 P 3 18 (SUTURE) IMPLANT
SUT ETHILON 5 0 P 3 18 (SUTURE) ×2
SUT MERSILENE 4 0 P 3 (SUTURE) IMPLANT
SUT NYLON ETHILON 5-0 P-3 1X18 (SUTURE) ×1 IMPLANT
SUT VIC AB 4-0 P-3 18XBRD (SUTURE) IMPLANT
SUT VIC AB 4-0 P3 18 (SUTURE)
SUT VICRYL RAPID 5 0 P 3 (SUTURE) IMPLANT
SWAB COLLECTION DEVICE MRSA (MISCELLANEOUS) ×3 IMPLANT
SYR BULB 3OZ (MISCELLANEOUS) ×3 IMPLANT
SYR CONTROL 10ML LL (SYRINGE) ×3 IMPLANT
TOWEL OR 17X24 6PK STRL BLUE (TOWEL DISPOSABLE) ×3 IMPLANT
TUBE ANAEROBIC SPECIMEN COL (MISCELLANEOUS) ×3 IMPLANT
UNDERPAD 30X30 INCONTINENT (UNDERPADS AND DIAPERS) ×3 IMPLANT

## 2014-06-03 NOTE — H&P (Signed)
[name] is a 53 year old right hand dominant female former patient of Dr. Rhona Raider who comes in with problems with her right ring finger. This dates back to 1988 when she suffered partial amputations to the tips of the right middle and right ring finger. She was originally treated by Dr. Randel Pigg for this and went on to not heal. She recently had revisions on 11-19-13 for osteomyelitis. She was placed on Augmentin which has allowed her middle finger to settle but her ring finger continues to be problematic for her. She is an insulin dependent diabetic. She has sleep apnea, history of prior finger infections. She has had multiple breakdowns of her long finger and subsequently her ring finger with grafting.  She has had nonhealing of her ring finger. She has distal phalanx left beneath this. She says Dr. Daylene Katayama amputated her middle finger removing the distal phalanx in May and this has healed over.  PAST MEDICAL HISTORY:  She is allergic to Acrylate, Allopurinol, Cipro, Dicloxacillin, Doxycycline, Keflex, Lopid, Morphine, Niaspan, Septra, and nitroglycerin.  She has a history of congestive heart failure and allergic rhinitis, esophageal reflux, IBS, low back pain, vitamin D deficiency, hyperlipidemia, migraines, embolisms, gout, essential hypertension. She has a chronic tracheostomy. She is on insulin, Humulin, Loratadine, vitamin D, Mysoline, Bumetanide, Bystolic, Uloric, Colcrys, Zostrix, Losartan, Cholestyramine, Topamax, Cymbalta, Albuterol inhaler, Pulmicort, Lipitor, oxygen, Mucinex, Neurontin, Norco, Warfarin, Toviaz, potassium, Macrobid, Victoza, Invokana, Meclizine, Promethazine, alprazolam and omeprazole.   SOCIAL HISTORY:  She does not smoke or drink. She is not married.  FAMILY MEDICAL HISTORY: Positive for diabetes, high BP, coronary artery disease, and arthritis.  REVIEW OF SYSTEMS: Positive for corrective lenses, chronic congestive heart failure, asthma, chronic cough, pneumonia, exertional  shortness of breath, dysuria, hematuria, skin ulcers, rash, depression, anxiety, sleep impairment, easy bleeding and bruising. [name] is an 53 y.o. female.   Chief Complaint: osteomyelitis right ring finger HPI: see above  Past Medical History  Diagnosis Date  . OSA (obstructive sleep apnea)   . Morbid obesity   . Depressive disorder, not elsewhere classified   . Diabetes mellitus   . Asthma   . Dyslipidemia   . Clostridium difficile enterocolitis 2012  . Splenomegaly 06/01/2011  . Thrombocytopenia 06/01/2011  . Hepatosplenomegaly   . Gout   . GERD (gastroesophageal reflux disease)   . Osteoarthritis   . Polyneuropathy   . Urinary incontinence   . Amenorrhea   . CHF (congestive heart failure)   . Hyperlipidemia   . Leg swelling   . Nausea   . Weakness   . Palpitations   . PONV (postoperative nausea and vomiting)   . Anginal pain     no chest pain in years  . Dysrhythmia     heart palpations  . Anxiety   . Pneumonia   . Shortness of breath   . Chronic kidney disease     overactive bladder  . Peripheral neuropathy   . DVT (deep venous thrombosis)     Past Surgical History  Procedure Laterality Date  . Cholecystectomy    . Tracheostomy  2001  . Endometrial ablation    . Skin graft    . Finger surgery      ring finger on right hand  . Esophageal dilation  2003  . Breast biopsy      needle core, left  . Dilation and curettage of uterus      times 2  . Tracheal dilitation  01/24/2012    Procedure: TRACHEAL  DILITATION;  Surgeon: Melissa Montane, MD;  Location: [place];  Service: ENT;  Laterality: N/A;  Trache change with possible dilation, from size 6 to size 7 uncuffed  . Amputation Right 11/19/2013    Procedure: AMPUTATION DIGIT;  Surgeon: Cammie Sickle, MD;  Location: [place];  Service: Orthopedics;  Laterality: Right;  Partial amputation right long finger, Debridement right ring finger     Family History  Problem Relation Age of Onset  .  Diabetes Father   . Hypertension Father   . Dementia Father     Deceased, 58  . Pneumonia Father   . Heart disease Mother   . Clotting disorder Mother   . Hypertension Mother   . Heart attack Mother     Deceased, 57  . Multiple myeloma Paternal Grandmother   . Cancer Paternal Grandmother     multiple myoloma  . Heart disease Paternal Grandfather   . Kidney disease Maternal Grandmother   . Hypertension Sister   . Cancer Paternal Aunt     breast  . Cancer Cousin     breast - all three   Social History:  reports that she has never smoked. She has never used smokeless tobacco. She reports that she does not drink alcohol or use illicit drugs.  Allergies:  Allergies  Allergen Reactions  . Molds & Smuts Shortness Of Breath and Rash  . Nitroglycerin Er Nausea And Vomiting  . Accolate [Zafirlukast] Other (See Comments)    Reaction unknown  . Allopurinol Other (See Comments)    GI problems   . Amoxicillin-Pot Clavulanate     Other reaction(s): Unknown  . Ciprofloxacin Other (See Comments)    GI problems  . Codeine Other (See Comments)    Heart problems  . Doxycycline Other (See Comments)    Gastric problems  . Fexofenadine Other (See Comments)    Hurts joints/pain  . Keflex [Cephalexin] Diarrhea and Nausea Only  . Morphine Other (See Comments)    unknown  . Sulfa Drugs Cross Reactors Nausea And Vomiting    nausea  . Sulfonamide Derivatives Nausea Only    No prescriptions prior to admission    No results found for this or any previous visit (from the past 48 hour(s)).  No results found.   Pertinent items are noted in HPI.  Height 5' 8"  (1.727 m), weight 181.439 kg (400 lb).  General appearance: cooperative and appears stated age Head: Normocephalic, without obvious abnormality Neck: no JVD Resp: clear to auscultation bilaterally Cardio: regular rate and rhythm, S1, S2 normal, no murmur, click, rub or gallop GI: soft, non-tender; bowel sounds normal; no masses,   no organomegaly Extremities: open swollen right ring finger Pulses: 2+ and symmetric Skin: Skin color, texture, turgor normal. No rashes or lesions Neurologic: Grossly normal Incision/Wound: Open RRF tip  Assessment/Plan X-rays reveal a distal phalanx present with changes.  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  We would recommend revision of her amputation and attempt at closure. We will place her on Augmentin afterwards. This will be scheduled as an outpatient under metacarpal block.  Loan Oguin R 06/03/2014, 10:15 AM

## 2014-06-03 NOTE — Anesthesia Preprocedure Evaluation (Signed)
Anesthesia Evaluation    Reviewed: reviewed documented beta blocker date and time   History of Anesthesia Complications (+) PONV  Airway Mallampati: Trach  TM Distance: <3 FB Neck ROM: Full    Dental  (+) Teeth Intact, Dental Advisory Given   Pulmonary shortness of breath and at rest, asthma , sleep apnea ,  Trach in place On vent at night  breath sounds clear to auscultation        Cardiovascular hypertension, Pt. on medications and Pt. on home beta blockers + angina +CHF + dysrhythmias Rhythm:Regular Rate:Normal     Neuro/Psych Anxiety Depression    GI/Hepatic GERD-  Medicated and Controlled,  Endo/Other  diabetes, Type 1, Insulin DependentMorbid obesity  Renal/GU Renal disease     Musculoskeletal  (+) Arthritis -, Osteoarthritis,    Abdominal   Peds  Hematology   Anesthesia Other Findings   Reproductive/Obstetrics                             Anesthesia Physical Anesthesia Plan  ASA: IV  Anesthesia Plan: MAC   Post-op Pain Management:    Induction: Intravenous  Airway Management Planned: Tracheostomy  Additional Equipment:   Intra-op Plan:   Post-operative Plan:   Informed Consent: I have reviewed the patients History and Physical, chart, labs and discussed the procedure including the risks, benefits and alternatives for the proposed anesthesia with the patient or authorized representative who has indicated his/her understanding and acceptance.   Dental advisory given  Plan Discussed with: CRNA  Anesthesia Plan Comments:         Anesthesia Quick Evaluation

## 2014-06-03 NOTE — Brief Op Note (Signed)
06/03/2014  2:22 PM  PATIENT:  [name]  53 y.o. female  PRE-OPERATIVE DIAGNOSIS:  OSTEOMYELITIS RIGHT RING FINGER  POST-OPERATIVE DIAGNOSIS:  osteomyelitis right ring finger  PROCEDURE:  Procedure(s): REVISION AMPUTATION RIGHT RING FINGER (Right)  SURGEON:  Surgeon(s) and Role:    * Daryll Brod, MD - Primary  PHYSICIAN ASSISTANT:   ASSISTANTS: none   ANESTHESIA:   local  EBL:     BLOOD ADMINISTERED:none  DRAINS: none   LOCAL MEDICATIONS USED:  BUPIVICAINE  and XYLOCAINE   SPECIMEN:  Source of Specimen:  culture  DISPOSITION OF SPECIMEN:  PATHOLOGY  COUNTS:  YES  TOURNIQUET:    DICTATION: .Other Dictation: Dictation Number [id number]  PLAN OF CARE: Discharge to home after PACU  PATIENT DISPOSITION:  PACU - hemodynamically stable.

## 2014-06-03 NOTE — Transfer of Care (Signed)
Immediate Anesthesia Transfer of Care Note  Patient: [name]  Procedure(s) Performed: Procedure(s): REVISION AMPUTATION RIGHT RING FINGER (Right)  Patient Location: PACU  Anesthesia Type:MAC  Level of Consciousness: awake, alert , oriented and patient cooperative  Airway & Oxygen Therapy: Patient Spontanous Breathing and Patient connected to face mask oxygen  Post-op Assessment: Report given to PACU RN and Post -op Vital signs reviewed and stable  Post vital signs: Reviewed and stable  Complications: No apparent anesthesia complications

## 2014-06-03 NOTE — Discharge Instructions (Signed)

## 2014-06-03 NOTE — Op Note (Signed)
Dictation Number [id number]

## 2014-06-03 NOTE — Anesthesia Postprocedure Evaluation (Signed)
  Anesthesia Post-op Note  Patient: [name]  Procedure(s) Performed: Procedure(s): REVISION AMPUTATION RIGHT RING FINGER (Right)  Patient Location: PACU  Anesthesia Type: MAC  Level of Consciousness: awake and alert   Airway and Oxygen Therapy: Patient Spontanous Breathing  Post-op Pain: none  Post-op Assessment: Post-op Vital signs reviewed, Patient's Cardiovascular Status Stable and Respiratory Function Stable  Post-op Vital Signs: Reviewed  Filed Vitals:   06/03/14 1445  BP:   Pulse: 64  Temp:   Resp:     Complications: No apparent anesthesia complications

## 2014-06-04 ENCOUNTER — Encounter (HOSPITAL_BASED_OUTPATIENT_CLINIC_OR_DEPARTMENT_OTHER): Payer: Self-pay | Admitting: Orthopedic Surgery

## 2014-06-04 NOTE — Op Note (Signed)
NAMEJEWELIANA, [name]:  [id number]  MEDICAL RECORD NO.:  [id number]  LOCATION:                                 FACILITY:  PHYSICIAN:  Daryll Brod, [name]       DATE OF BIRTH:  1960-11-07  DATE OF PROCEDURE:  06/03/2014 DATE OF DISCHARGE:                              OPERATIVE REPORT   PREOPERATIVE DIAGNOSIS:  Osteomyelitis, right ring finger.  POSTOPERATIVE DIAGNOSIS:  Osteomyelitis, right ring finger.  OPERATION:  Revision amputation with volar flap, right ring finger.  SURGEON:  Daryll Brod, MD.  ANESTHESIA:  Metacarpal block.  HISTORY:  The patient is a 53 year old female with a long history of problems with the middle ring finger of her right hand following a crush injury.  This dates back to approximately 1988, has developed osteomyelitis with poor healing.  She is admitted now for revision amputation of her right ring finger and that she has a small portion distal phalanx intact with open area dorsal to it.  She is aware that there is no guarantee with the surgery, possibility of infection; recurrence of injury to arteries, nerves, tendons; incomplete relief of symptoms, dystrophy.  In preoperative area, the patient is seen, the extremity marked by both patient and surgeon.  Antibiotic given.  PROCEDURE IN DETAIL:  The patient was brought to the operating room.  A metacarpal block was given with 0.25% bupivacaine and 1% Xylocaine both without epinephrine.  She was prepped using Betadine scrub and solution with the right arm free.  A Penrose drain was used for tourniquet control.  After adequate anesthesia was afforded, the area of necrotic skin dorsally was excised and a large flap taken down to the distal phalanx.  This was disarticulated the distal interphalangeal joint. Cultures were taken.  The wound was copiously irrigated with saline. The distal end of the middle phalanx was smoothed with a rongeur removing all cartilage.  The flap was then  rotated dorsally, closed with a deep 4-0 chromic suture and a 5-0 nylon suture in the skin.  Sterile compressive dressing, splint to the finger applied.  A Penrose drain was removed.  The patient tolerated the procedure well, was taken to the recovery room for observation in satisfactory condition.  She will be discharged home to return to [facility] in 1 week on Vicodin and Augmentin.          ______________________________ Daryll Brod, [name]     GK/MEDQ  D:  06/03/2014  T:  06/04/2014  Job:  [id number]

## 2014-06-07 LAB — WOUND CULTURE

## 2014-06-08 ENCOUNTER — Telehealth: Payer: Self-pay | Admitting: Neurology

## 2014-06-08 LAB — ANAEROBIC CULTURE

## 2014-06-08 NOTE — Telephone Encounter (Signed)
Pt called and resch appt to 08-19-14

## 2014-06-11 ENCOUNTER — Encounter (HOSPITAL_COMMUNITY): Payer: Self-pay | Admitting: *Deleted

## 2014-06-11 ENCOUNTER — Inpatient Hospital Stay (HOSPITAL_COMMUNITY): Payer: Medicare Other

## 2014-06-11 ENCOUNTER — Inpatient Hospital Stay (HOSPITAL_COMMUNITY)
Admission: AD | Admit: 2014-06-11 | Discharge: 2014-06-11 | Disposition: A | Discharge status: Home / Self Care | Payer: Medicare Other | Source: Ambulatory Visit | Attending: Obstetrics and Gynecology | Admitting: Obstetrics and Gynecology

## 2014-06-11 DIAGNOSIS — Z9889 Other specified postprocedural states: Secondary | ICD-10-CM | POA: Diagnosis not present

## 2014-06-11 DIAGNOSIS — I4891 Unspecified atrial fibrillation: Secondary | ICD-10-CM | POA: Diagnosis not present

## 2014-06-11 DIAGNOSIS — N938 Other specified abnormal uterine and vaginal bleeding: Secondary | ICD-10-CM | POA: Diagnosis present

## 2014-06-11 DIAGNOSIS — D696 Thrombocytopenia, unspecified: Secondary | ICD-10-CM | POA: Diagnosis not present

## 2014-06-11 DIAGNOSIS — N939 Abnormal uterine and vaginal bleeding, unspecified: Secondary | ICD-10-CM

## 2014-06-11 DIAGNOSIS — Z7901 Long term (current) use of anticoagulants: Secondary | ICD-10-CM | POA: Insufficient documentation

## 2014-06-11 LAB — CBC
HCT: 37.9 % (ref 36.0–46.0)
Hemoglobin: 12.3 g/dL (ref 12.0–15.0)
MCH: 31.1 pg (ref 26.0–34.0)
MCHC: 32.5 g/dL (ref 30.0–36.0)
MCV: 95.7 fL (ref 78.0–100.0)
PLATELETS: 109 10*3/uL — AB (ref 150–400)
RBC: 3.96 MIL/uL (ref 3.87–5.11)
RDW: 15.2 % (ref 11.5–15.5)
WBC: 6.9 10*3/uL (ref 4.0–10.5)

## 2014-06-11 LAB — COMPREHENSIVE METABOLIC PANEL
ALK PHOS: 78 U/L (ref 39–117)
ALT: 21 U/L (ref 0–35)
AST: 24 U/L (ref 0–37)
Albumin: 3.1 g/dL — ABNORMAL LOW (ref 3.5–5.2)
Anion gap: 13 (ref 5–15)
BILIRUBIN TOTAL: 0.4 mg/dL (ref 0.3–1.2)
BUN: 12 mg/dL (ref 6–23)
CHLORIDE: 97 meq/L (ref 96–112)
CO2: 26 meq/L (ref 19–32)
CREATININE: 0.84 mg/dL (ref 0.50–1.10)
Calcium: 8.4 mg/dL (ref 8.4–10.5)
GFR calc Af Amer: >90 mL/min (ref >90)
GFR calc non Af Amer: 79 mL/min — ABNORMAL LOW (ref >90)
Glucose, Bld: 206 mg/dL — ABNORMAL HIGH (ref 70–99)
Potassium: 4.4 mEq/L (ref 3.7–5.3)
Sodium: 136 mEq/L — ABNORMAL LOW (ref 137–147)
Total Protein: 7.2 g/dL (ref 6.0–8.3)

## 2014-06-11 LAB — GLUCOSE, CAPILLARY: Glucose-Capillary: 184 mg/dL — ABNORMAL HIGH (ref 70–99)

## 2014-06-11 LAB — URINALYSIS, ROUTINE W REFLEX MICROSCOPIC
Bilirubin Urine: NEGATIVE
Ketones, ur: NEGATIVE mg/dL
Leukocytes, UA: NEGATIVE
Nitrite: NEGATIVE
Protein, ur: NEGATIVE mg/dL
SPECIFIC GRAVITY, URINE: 1.015 (ref 1.005–1.030)
UROBILINOGEN UA: 0.2 mg/dL (ref 0.0–1.0)
pH: 6 (ref 5.0–8.0)

## 2014-06-11 LAB — PROTIME-INR
INR: 3.77 — ABNORMAL HIGH (ref 0.00–1.49)
Prothrombin Time: 37.5 seconds — ABNORMAL HIGH (ref 11.6–15.2)

## 2014-06-11 LAB — APTT: aPTT: 49 seconds — ABNORMAL HIGH (ref 24–37)

## 2014-06-11 LAB — POCT PREGNANCY, URINE: Preg Test, Ur: NEGATIVE

## 2014-06-11 LAB — URINE MICROSCOPIC-ADD ON

## 2014-06-11 LAB — TSH: TSH: 1.33 u[IU]/mL (ref 0.350–4.500)

## 2014-06-11 NOTE — MAU Note (Signed)
Pt states she had endometrial ablation 8 years ago, started bleeding with clots last night.  Denies pain.

## 2014-06-11 NOTE — MAU Note (Signed)
Pt states vaginal bleeding started last night with passing quarter sized clots.  Pt is currently on a blood thinner.  Pt states she hasn't had any vaginal bleeding since a few days after the surgery.  Pt is currently treated at the wound center for right ring finger after amputation.  Culture on day of surgery is positive for MRSA.  Pt is currently on antibiotics and is questioning if it is the appropriate antibiotic for this treatment.  Prior to coming in the MAU, pt hadn't be informed of the positive culture.  Pt also C/O headache which started this morning.  Pt is currently sexually active with same partner for the last 7-8 years.  Last intercourse 1.5 months ago.

## 2014-06-11 NOTE — Discharge Instructions (Signed)
Abnormal Uterine Bleeding Abnormal uterine bleeding can affect women at various stages in life, including teenagers, women in their reproductive years, pregnant women, and women who have reached menopause. Several kinds of uterine bleeding are considered abnormal, including:  Bleeding or spotting between periods.   Bleeding after sexual intercourse.   Bleeding that is heavier or more than normal.   Periods that last longer than usual.  Bleeding after menopause.  Many cases of abnormal uterine bleeding are minor and simple to treat, while others are more serious. Any type of abnormal bleeding should be evaluated by your health care provider. Treatment will depend on the cause of the bleeding. HOME CARE INSTRUCTIONS Monitor your condition for any changes. The following actions may help to alleviate any discomfort you are experiencing:  Avoid the use of tampons and douches as directed by your health care provider.  Change your pads frequently. You should get regular pelvic exams and Pap tests. Keep all follow-up appointments for diagnostic tests as directed by your health care provider.  SEEK MEDICAL CARE IF:   Your bleeding lasts more than 1 week.   You feel dizzy at times.  SEEK IMMEDIATE MEDICAL CARE IF:   You pass out.   You are changing pads every 15 to 30 minutes.   You have abdominal pain.  You have a fever.   You become sweaty or weak.   You are passing large blood clots from the vagina.   You start to feel nauseous and vomit. MAKE SURE YOU:   Understand these instructions.  Will watch your condition.  Will get help right away if you are not doing well or get worse. Document Released: 06/11/2005 Document Revised: 06/16/2013 Document Reviewed: 01/08/2013 Exit[employer] Patient Information 2015 [employer]. This information is not intended to replace advice given to you by your health care provider. Make sure you discuss any questions you have with your  health care provider.  

## 2014-06-11 NOTE — MAU Provider Note (Signed)
History   53 yo non-pregnant patient presented unannounced c/o moderate bleeding last night.  S/P endometrial ablation, with no bleeding since.  Denies pain. Multiple co-morbidities, with patient having a trach due to obstructive sleep apnea, on anticoagulants due to hx of afib, diabetes, recent surgery on finger due to osteomyelitis, chronic heart failure, thrombocytopenia, massive obesity, chronic kidney dx.  Recent surgery on right ring finger for chronic osteomyelitis.  New dx of MRSA during that hospitalization, of which patient was unaware until informed during this MAU visit.  Placed on contact isolation on arrival.  Patient of Dr. Raphael Gibney, last seen 10/29/13, treated for UTI.  No upcoming appts.  Patient Active Problem List   Diagnosis Date Noted  . Vaginal bleeding 06/11/2014  . Hypokalemia 02/08/2014  . Acute blood loss anemia 02/08/2014  . Open fracture of toe 02/06/2014  . Chronic osteomyelitis 12/03/2013  . Chronic diastolic heart failure 78/67/6720  . Mixed hyperlipidemia 07/29/2013  . Chronic respiratory failure 04/22/2013  . Urgency incontinence 08/08/2012  . Lactic acidosis 08/08/2012  . Leukocytosis 08/08/2012  . Hemoptysis 03/27/2012  . Abscess and cellulitis 01/01/2012  . Diabetes mellitus type 2 in obese 01/01/2012  . Obesity (BMI 35.0-39.9 without comorbidity) 01/01/2012  . OSA (obstructive sleep apnea) 01/01/2012  . HTN (hypertension), benign 01/01/2012  . GERD (gastroesophageal reflux disease) 01/01/2012  . 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 Current use of long term anticoagulation 01/01/2012  . Hx of Clostridium difficile infection 11/16/2011  . Splenomegaly 06/01/2011  . Thrombocytopenia 06/01/2011  . ALLERGIC RHINITIS 10/31/2009  . ASTHMA, MILD 10/31/2009  . DYSPHAGIA 10/22/2008  . DIARRHEA 10/22/2008  . FATTY LIVER DISEASE 03/26/2008  . Morbid obesity 08/07/2007  . OBESITY HYPOVENTILATION SYNDROME 08/07/2007    Chief Complaint  Patient presents  with  . Vaginal Bleeding   HPI:  See above  OB History    Gravida Para Term Preterm AB TAB SAB Ectopic Multiple Living   0         0      Past Medical History  Diagnosis Date  . OSA (obstructive sleep apnea)   . Morbid obesity   . Depressive disorder, not elsewhere classified   . Diabetes mellitus   . Asthma   . Dyslipidemia   . Clostridium difficile enterocolitis 2012  . Splenomegaly 06/01/2011  . Thrombocytopenia 06/01/2011  . Hepatosplenomegaly   . Gout   . GERD (gastroesophageal reflux disease)   . Osteoarthritis   . Polyneuropathy   . Urinary incontinence   . Amenorrhea   . CHF (congestive heart failure)   . Hyperlipidemia   . Leg swelling   . Nausea   . Weakness   . Palpitations   . PONV (postoperative nausea and vomiting)   . Anginal pain     no chest pain in years  . Dysrhythmia     heart palpations  . Anxiety   . Pneumonia   . Shortness of breath   . Chronic kidney disease     overactive bladder  . Peripheral neuropathy   . DVT (deep venous thrombosis)     Past Surgical History  Procedure Laterality Date  . Cholecystectomy    . Tracheostomy  2001  . Endometrial ablation    . Skin graft    . Finger surgery      ring finger on right hand  . Esophageal dilation  2003  . Breast biopsy      needle core, left  . Dilation and curettage of uterus  times 2  . Tracheal dilitation  01/24/2012    Procedure: TRACHEAL DILITATION;  Surgeon: Melissa Montane, MD;  Location: [facility];  Service: ENT;  Laterality: N/A;  Trache change with possible dilation, from size 6 to size 7 uncuffed  . Amputation Right 11/19/2013    Procedure: AMPUTATION DIGIT;  Surgeon: Cammie Sickle, MD;  Location: [facility];  Service: Orthopedics;  Laterality: Right;  Partial amputation right long finger, Debridement right ring finger   . Amputation Right 06/03/2014    Procedure: REVISION AMPUTATION RIGHT RING FINGER;  Surgeon: Daryll Brod, MD;  Location: [facility];  Service: Orthopedics;  Laterality: Right;    Family History  Problem Relation Age of Onset  . Diabetes Father   . Hypertension Father   . Dementia Father     Deceased, 32  . Pneumonia Father   . Heart disease Mother   . Clotting disorder Mother   . Hypertension Mother   . Heart attack Mother     Deceased, 99  . Multiple myeloma Paternal Grandmother   . Cancer Paternal Grandmother     multiple myoloma  . Heart disease Paternal Grandfather   . Kidney disease Maternal Grandmother   . Hypertension Sister   . Cancer Paternal Aunt     breast  . Cancer Cousin     breast - all three    History  Substance Use Topics  . Smoking status: Never Smoker   . Smokeless tobacco: Never Used  . Alcohol Use: No     Comment: occasional - once or twice a year    Allergies:  Allergies  Allergen Reactions  . Molds & Smuts Shortness Of Breath and Rash  . Nitroglycerin Er Nausea And Vomiting  . Accolate [Zafirlukast] Other (See Comments)    Reaction unknown  . Allopurinol Other (See Comments)    GI problems   . Amoxicillin-Pot Clavulanate     Other reaction(s): Unknown  . Ciprofloxacin Other (See Comments)    GI problems  . Codeine Other (See Comments)    Heart problems  . Doxycycline Other (See Comments)    Gastric problems  . Fexofenadine Other (See Comments)    Hurts joints/pain  . Keflex [Cephalexin] Diarrhea and Nausea Only  . Morphine Other (See Comments)    unknown  . Sulfa Drugs Cross Reactors Nausea And Vomiting    nausea  . Sulfonamide Derivatives Nausea Only    Prescriptions prior to admission  Medication Sig Dispense Refill Last Dose  . acetaminophen (TYLENOL) 500 MG tablet Take 1,000 mg by mouth every 6 (six) hours as needed for mild pain. For pain   Past Week at Unknown time  . albuterol (PROVENTIL) (2.5 MG/3ML) 0.083% nebulizer solution Take 2.5 mg by nebulization every 4 (four) hours as needed. For shortness of breath   More than a month at Unknown time   . ALPRAZolam (XANAX) 0.5 MG tablet Take 0.5 mg by mouth 2 (two) times daily as needed. For anxiety   06/02/2014 at Unknown time  . amoxicillin-clavulanate (AUGMENTIN) 875-125 MG per tablet Take 1 tablet by mouth 2 (two) times daily. 29 tablet 0   . atorvastatin (LIPITOR) 10 MG tablet Take 10 mg by mouth at bedtime.    06/02/2014 at Unknown time  . B Complex Vitamins (VITAMIN B COMPLEX PO) Take 1 tablet by mouth daily.    06/02/2014 at Unknown time  . BD INSULIN SYRINGE ULTRAFINE 31G X 5/16" 0.3 ML MISC  5 06/02/2014 at Unknown time  . budesonide (PULMICORT) 0.25 MG/2ML nebulizer solution Take 0.25 mg by nebulization 2 (two) times daily as needed (shortness of breath).   More than a month at Unknown time  . bumetanide (BUMEX) 2 MG tablet Take 1 tablet (2 mg total) by mouth daily. 90 tablet 3 06/02/2014 at Unknown time  . busPIRone (BUSPAR) 5 MG tablet Take 7.5 mg by mouth 2 (two) times daily.    06/02/2014 at Unknown time  . Canagliflozin (INVOKANA) 100 MG TABS Take 100 mg by mouth daily.    06/02/2014 at Unknown time  . cholestyramine (QUESTRAN) 4 G packet Take 1 packet by mouth as needed. For loose stools, IBS.   06/03/2014 at 0330  . clotrimazole (LOTRIMIN) 1 % cream Apply 1 application topically daily. To stomach   02/05/2014 at Unknown time  . colchicine 0.6 MG tablet Take 0.6 mg by mouth daily.   06/02/2014 at Unknown time  . DULoxetine (CYMBALTA) 30 MG capsule TAKE 1 CAPSULE (30 MG TOTAL) BY MOUTH 2 (TWO) TIMES DAILY. 180 capsule 3 06/02/2014 at Unknown time  . ergocalciferol (VITAMIN D2) 50000 UNITS capsule Take 50,000 Units by mouth once a week. Take on Fridays   Past Week at Unknown time  . Febuxostat (ULORIC) 80 MG TABS Take 80 mg by mouth at bedtime.    06/02/2014 at Unknown time  . fesoterodine (TOVIAZ) 4 MG TB24 Take 4 mg by mouth daily.     06/02/2014 at Unknown time  . fish oil-omega-3 fatty acids 1000 MG capsule Take 1 g by mouth 2 (two) times daily.    06/02/2014 at Unknown time  . gabapentin  (NEURONTIN) 100 MG capsule Take 1 capsule (100 mg total) by mouth See admin instructions. Take one tablet in the morning, 2 tablets in the afternoon, and 2 tablets at bedtime 150 capsule 5 06/02/2014 at Unknown time  . gabapentin (NEURONTIN) 100 MG capsule TAKE ONE CAPSULE BY MOUTH EVERY MORNING AND 2 CAPS IN THE AFTERNOON AND 2 CAPS AT BEDTIME 480 capsule 3   . guaiFENesin (MUCINEX) 600 MG 12 hr tablet Take 600 mg by mouth 2 (two) times daily.    06/02/2014 at Unknown time  . HYDROcodone-acetaminophen (NORCO/VICODIN) 5-325 MG per tablet Take 1 tablet by mouth every 6 (six) hours as needed for moderate pain.    06/02/2014 at Unknown time  . insulin regular human CONCENTRATED (HUMULIN R) 500 UNIT/ML SOLN injection Inject 28 Units into the skin 2 (two) times daily before a meal.    06/02/2014 at Unknown time  . ipratropium (ATROVENT) 0.02 % nebulizer solution Take 0.5 mg by nebulization 4 (four) times daily as needed for wheezing or shortness of breath.   More than a month at Unknown time  . Liraglutide (VICTOZA) 18 MG/3ML SOLN injection Inject 1.8 mg into the skin daily.   Past Month at Unknown time  . loratadine (CLARITIN) 10 MG tablet Take 10 mg by mouth daily as needed. For allergies   02/05/2014 at Unknown time  . losartan (COZAAR) 100 MG tablet Take 1 tablet (115m) by mouth daily. 90 tablet 3 06/02/2014 at Unknown time  . meclizine (ANTIVERT) 25 MG tablet Take 25 mg by mouth at bedtime.    06/02/2014 at Unknown time  . metolazone (ZAROXOLYN) 5 MG tablet Take 1 tablet (5 mg total) by mouth See admin instructions. 3 times weekly as needed for edema 15 tablet 5 Past Week at Unknown time  . Multiple Vitamin (MULTIVITAMIN) capsule Take  1 capsule by mouth daily.     06/02/2014 at Unknown time  . nebivolol (BYSTOLIC) 10 MG tablet Take 10 mg by mouth daily.   06/02/2014 at Unknown time  . omeprazole (PRILOSEC) 40 MG capsule Take 40 mg by mouth 2 (two) times daily.    06/02/2014 at Unknown time  . ondansetron  (ZOFRAN) 4 MG tablet Take 1 tablet (4 mg total) by mouth every 8 (eight) hours as needed for nausea. 20 tablet 0 Unknown at Unknown time  . oxyCODONE-acetaminophen (PERCOCET) 10-325 MG per tablet Take 1 tablet by mouth every 4 (four) hours as needed for pain. 30 tablet 0   . oxyCODONE-acetaminophen (PERCOCET/ROXICET) 5-325 MG per tablet Take 1-2 tablets by mouth every 6 (six) hours as needed for moderate pain or severe pain. 15 tablet 0 Unknown at Unknown time  . potassium chloride SA (K-DUR,KLOR-CON) 20 MEQ tablet Take 20 mEq by mouth 3 (three) times daily. 1 in AM, 2 at lunch, 1 at supper and 2 at bedtime   06/02/2014 at Unknown time  . primidone (MYSOLINE) 50 MG tablet Take 50 mg by mouth 2 (two) times daily. Take one tablet twice daily   06/02/2014 at Unknown time  . Probiotic Product (PROBIOTIC PO) Take 1 tablet by mouth daily.    06/02/2014 at Unknown time  . VICTOZA 18 MG/3ML SOPN   2 Unknown at Unknown time  . warfarin (COUMADIN) 5 MG tablet Take 5 mg by mouth daily.    06/02/2014 at Unknown time    ROS:  Vaginal bleeding last night and this am, none now. Physical Exam   Blood pressure 111/56, pulse 83, temperature 98.1 F (36.7 C), temperature source Oral, resp. rate 20, height _0  (1.727 m), weight 400 lb (181.439 kg), last menstrual period 05/17/2014, SpO2 100 %, unknown if currently breastfeeding.  Physical Exam  Morbidly obese patient, unable to walk without walker--in NAD Trach in place, with cap Chest clear Heart RRR without murmur Abd--massive pannus noted, unable to assess abdomen adequately Pelvic--no bleeding at present.  Unable to perform adequate pelvic due to abdominal girth and habitus  Results for orders placed or performed during the hospital encounter of 06/11/14 (from the past 24 hour(s))  Glucose, capillary     Status: Abnormal   Collection Time: 06/11/14 12:44 PM  Result Value Ref Range   Glucose-Capillary 184 (H) 70 - 99 mg/dL  CBC     Status: Abnormal    Collection Time: 06/11/14 12:53 PM  Result Value Ref Range   WBC 6.9 4.0 - 10.5 K/uL   RBC 3.96 3.87 - 5.11 MIL/uL   Hemoglobin 12.3 12.0 - 15.0 g/dL   HCT 37.9 36.0 - 46.0 %   MCV 95.7 78.0 - 100.0 fL   MCH 31.1 26.0 - 34.0 pg   MCHC 32.5 30.0 - 36.0 g/dL   RDW 15.2 11.5 - 15.5 %   Platelets 109 (L) 150 - 400 K/uL  Comprehensive metabolic panel     Status: Abnormal   Collection Time: 06/11/14 12:53 PM  Result Value Ref Range   Sodium 136 (L) 137 - 147 mEq/L   Potassium 4.4 3.7 - 5.3 mEq/L   Chloride 97 96 - 112 mEq/L   CO2 26 19 - 32 mEq/L   Glucose, Bld 206 (H) 70 - 99 mg/dL   BUN 12 6 - 23 mg/dL   Creatinine, Ser 0.84 0.50 - 1.10 mg/dL   Calcium 8.4 8.4 - 10.5 mg/dL   Total Protein 7.2 6.0 -  8.3 g/dL   Albumin 3.1 (L) 3.5 - 5.2 g/dL   AST 24 0 - 37 U/L   ALT 21 0 - 35 U/L   Alkaline Phosphatase 78 39 - 117 U/L   Total Bilirubin 0.4 0.3 - 1.2 mg/dL   GFR calc non Af Amer 79 (L) >90 mL/min   GFR calc Af Amer >90 >90 mL/min   Anion gap 13 5 - 15  TSH     Status: None   Collection Time: 06/11/14 12:53 PM  Result Value Ref Range   TSH 1.330 0.350 - 4.500 uIU/mL  Urinalysis, Routine w reflex microscopic     Status: Abnormal   Collection Time: 06/11/14  1:53 PM  Result Value Ref Range   Color, Urine YELLOW YELLOW   APPearance CLEAR CLEAR   Specific Gravity, Urine 1.015 1.005 - 1.030   pH 6.0 5.0 - 8.0   Glucose, UA >1000 (A) NEGATIVE mg/dL   Hgb urine dipstick MODERATE (A) NEGATIVE   Bilirubin Urine NEGATIVE NEGATIVE   Ketones, ur NEGATIVE NEGATIVE mg/dL   Protein, ur NEGATIVE NEGATIVE mg/dL   Urobilinogen, UA 0.2 0.0 - 1.0 mg/dL   Nitrite NEGATIVE NEGATIVE   Leukocytes, UA NEGATIVE NEGATIVE  Urine microscopic-add on     Status: Abnormal   Collection Time: 06/11/14  1:53 PM  Result Value Ref Range   Squamous Epithelial / LPF FEW (A) RARE   RBC / HPF 7-10 <3 RBC/hpf   Bacteria, UA RARE RARE  Pregnancy, urine POC     Status: None   Collection Time: 06/11/14  1:54 PM   Result Value Ref Range   Preg Test, Ur NEGATIVE NEGATIVE  Protime-INR     Status: Abnormal   Collection Time: 06/11/14  2:40 PM  Result Value Ref Range   Prothrombin Time 37.5 (H) 11.6 - 15.2 seconds   INR 3.77 (H) 0.00 - 1.49  APTT     Status: Abnormal   Collection Time: 06/11/14  2:40 PM  Result Value Ref Range   aPTT 49 (H) 24 - 37 seconds   Pelvic US done at patient's bedside:   IMPRESSION: Extremely suboptimal visualization of the pelvic contents due to patient body habitus and immobility. A portion of the cervix was visualized on 1 image only which is not considered diagnostic due to artifact. CT would likely be suboptimal for delineation of the endometrium/myometrium, and the patient is above the weight limit for the CT scanner at [place], and also above the weight limit for MRI. CT abdomen/ pelvis 12/30/2013 demonstrated no gross abnormality of the uterus allowing for technique. Consider endometrial sampling if symptoms continue.   ED Course  Assessment: Episode of DUB, s/p ablation 2008 On chronic anticoagulation due to afib Thrombocytopenia Multiple co-morbidities  Plan: Consulted with Dr. Mancel Bale. D/C home with bleeding precautions. To f/u with Dr. Raphael Gibney in the office for further evaluation of bleeding. Support to patient for multiple issues.   Donnel Saxon CNM, MSN 06/11/2014 2:23 PM

## 2014-06-14 ENCOUNTER — Ambulatory Visit: Payer: Medicare Other | Admitting: Neurology

## 2014-06-16 ENCOUNTER — Other Ambulatory Visit: Payer: Self-pay | Admitting: Neurology

## 2014-06-19 ENCOUNTER — Encounter (HOSPITAL_COMMUNITY): Payer: Self-pay | Admitting: Emergency Medicine

## 2014-06-19 ENCOUNTER — Emergency Department (HOSPITAL_COMMUNITY)
Admission: EM | Admit: 2014-06-19 | Discharge: 2014-06-19 | Disposition: A | Discharge status: Home / Self Care | Payer: Medicare Other | Attending: Emergency Medicine | Admitting: Emergency Medicine

## 2014-06-19 DIAGNOSIS — Z86718 Personal history of other venous thrombosis and embolism: Secondary | ICD-10-CM | POA: Insufficient documentation

## 2014-06-19 DIAGNOSIS — E785 Hyperlipidemia, unspecified: Secondary | ICD-10-CM | POA: Diagnosis not present

## 2014-06-19 DIAGNOSIS — M86141 Other acute osteomyelitis, right hand: Secondary | ICD-10-CM | POA: Insufficient documentation

## 2014-06-19 DIAGNOSIS — I509 Heart failure, unspecified: Secondary | ICD-10-CM | POA: Diagnosis not present

## 2014-06-19 DIAGNOSIS — Z79899 Other long term (current) drug therapy: Secondary | ICD-10-CM | POA: Diagnosis not present

## 2014-06-19 DIAGNOSIS — E119 Type 2 diabetes mellitus without complications: Secondary | ICD-10-CM | POA: Diagnosis not present

## 2014-06-19 DIAGNOSIS — M109 Gout, unspecified: Secondary | ICD-10-CM | POA: Insufficient documentation

## 2014-06-19 DIAGNOSIS — Z8619 Personal history of other infectious and parasitic diseases: Secondary | ICD-10-CM | POA: Diagnosis not present

## 2014-06-19 DIAGNOSIS — G629 Polyneuropathy, unspecified: Secondary | ICD-10-CM | POA: Diagnosis not present

## 2014-06-19 DIAGNOSIS — I209 Angina pectoris, unspecified: Secondary | ICD-10-CM | POA: Diagnosis not present

## 2014-06-19 DIAGNOSIS — Z880 Allergy status to penicillin: Secondary | ICD-10-CM | POA: Insufficient documentation

## 2014-06-19 DIAGNOSIS — Z794 Long term (current) use of insulin: Secondary | ICD-10-CM | POA: Insufficient documentation

## 2014-06-19 DIAGNOSIS — Z8701 Personal history of pneumonia (recurrent): Secondary | ICD-10-CM | POA: Insufficient documentation

## 2014-06-19 DIAGNOSIS — M869 Osteomyelitis, unspecified: Secondary | ICD-10-CM

## 2014-06-19 DIAGNOSIS — Z7901 Long term (current) use of anticoagulants: Secondary | ICD-10-CM | POA: Insufficient documentation

## 2014-06-19 DIAGNOSIS — F329 Major depressive disorder, single episode, unspecified: Secondary | ICD-10-CM | POA: Insufficient documentation

## 2014-06-19 DIAGNOSIS — N189 Chronic kidney disease, unspecified: Secondary | ICD-10-CM | POA: Diagnosis not present

## 2014-06-19 DIAGNOSIS — D696 Thrombocytopenia, unspecified: Secondary | ICD-10-CM | POA: Insufficient documentation

## 2014-06-19 DIAGNOSIS — M1990 Unspecified osteoarthritis, unspecified site: Secondary | ICD-10-CM | POA: Insufficient documentation

## 2014-06-19 DIAGNOSIS — Z8742 Personal history of other diseases of the female genital tract: Secondary | ICD-10-CM | POA: Diagnosis not present

## 2014-06-19 DIAGNOSIS — F419 Anxiety disorder, unspecified: Secondary | ICD-10-CM | POA: Insufficient documentation

## 2014-06-19 DIAGNOSIS — Z89021 Acquired absence of right finger(s): Secondary | ICD-10-CM | POA: Diagnosis not present

## 2014-06-19 DIAGNOSIS — Z9889 Other specified postprocedural states: Secondary | ICD-10-CM | POA: Insufficient documentation

## 2014-06-19 DIAGNOSIS — K219 Gastro-esophageal reflux disease without esophagitis: Secondary | ICD-10-CM | POA: Diagnosis not present

## 2014-06-19 DIAGNOSIS — L089 Local infection of the skin and subcutaneous tissue, unspecified: Secondary | ICD-10-CM | POA: Diagnosis present

## 2014-06-19 MED ORDER — SODIUM CHLORIDE 0.9 % IV SOLN
750.0000 mg | Freq: Once | INTRAVENOUS | Status: AC
  Administered 2014-06-19: 750 mg via INTRAVENOUS
  Filled 2014-06-19: qty 15

## 2014-06-19 NOTE — Discharge Instructions (Signed)
Return tomorrow for another dose of IV antibiotics. Follow up on Monday with [name] as scheduled.

## 2014-06-19 NOTE — ED Notes (Signed)
Sent pt's antibiotic prescription down to pharmacy.

## 2014-06-19 NOTE — ED Provider Notes (Signed)
CSN: [id number]     Arrival date & time 06/19/14  1406 History   First MD Initiated Contact with Patient 06/19/14 1538     Chief Complaint  Patient presents with  . IV antibiotics      (Consider location/radiation/quality/duration/timing/severity/associated sxs/prior Treatment) HPI [name] is a 53 y.o. female with numerous medical problems, presents to ED with complaint of infection to the right ring finger. Pt states she had a surgical procedure on the finger, states has been on antibiotics for several weeks, and it was not improving. She reports "it grew MRSA." She was seen by Dr. Burney Gauze today, was sent here for IV antibiotics dose. She was given prescription for cubicin 42m/kg. Pt denies any fever, chills, malaise. Records show pt with Osteomyelitis of right ring finger, had amputation revision on 12/10 by Dr. KFredna Dow   Past Medical History  Diagnosis Date  . OSA (obstructive sleep apnea)   . Morbid obesity   . Depressive disorder, not elsewhere classified   . Diabetes mellitus   . Asthma   . Dyslipidemia   . Clostridium difficile enterocolitis 2012  . Splenomegaly 06/01/2011  . Thrombocytopenia 06/01/2011  . Hepatosplenomegaly   . Gout   . GERD (gastroesophageal reflux disease)   . Osteoarthritis   . Polyneuropathy   . Urinary incontinence   . Amenorrhea   . CHF (congestive heart failure)   . Hyperlipidemia   . Leg swelling   . Nausea   . Weakness   . Palpitations   . PONV (postoperative nausea and vomiting)   . Anginal pain     no chest pain in years  . Dysrhythmia     heart palpations  . Anxiety   . Pneumonia   . Shortness of breath   . Chronic kidney disease     overactive bladder  . Peripheral neuropathy   . DVT (deep venous thrombosis)    Past Surgical History  Procedure Laterality Date  . Cholecystectomy    . Tracheostomy  2001  . Endometrial ablation    . Skin graft    . Finger surgery      ring finger on right hand  . Esophageal dilation   2003  . Breast biopsy      needle core, left  . Dilation and curettage of uterus      times 2  . Tracheal dilitation  01/24/2012    Procedure: TRACHEAL DILITATION;  Surgeon: JMelissa Montane MD;  [place] [facility]  Service: ENT;  Laterality: N/A;  Trache change with possible dilation, from size 6 to size 7 uncuffed  . Amputation Right 11/19/2013    Procedure: AMPUTATION DIGIT;  Surgeon: RCammie Sickle MD;  [place] [facility]  Service: Orthopedics;  Laterality: Right;  Partial amputation right long finger, Debridement right ring finger   . Amputation Right 06/03/2014    Procedure: REVISION AMPUTATION RIGHT RING FINGER;  Surgeon: GDaryll Brod MD;  [place] [facility]  Service: Orthopedics;  Laterality: Right;   Family History  Problem Relation Age of Onset  . Diabetes Father   . Hypertension Father   . Dementia Father     Deceased, 736 . Pneumonia Father   . Heart disease Mother   . Clotting disorder Mother   . Hypertension Mother   . Heart attack Mother     Deceased, 758 . Multiple myeloma Paternal Grandmother   . Cancer Paternal Grandmother     multiple myoloma  . Heart  disease Paternal Grandfather   . Kidney disease Maternal Grandmother   . Hypertension Sister   . Cancer Paternal Aunt     breast  . Cancer Cousin     breast - all three   History  Substance Use Topics  . Smoking status: Never Smoker   . Smokeless tobacco: Never Used  . Alcohol Use: No     Comment: occasional - once or twice a year   OB History    Gravida Para Term Preterm AB TAB SAB Ectopic Multiple Living   0         0     Review of Systems  Constitutional: Negative for fever and chills.  Musculoskeletal: Positive for myalgias and joint swelling.  Skin: Positive for wound.      Allergies  Molds & smuts; Nitroglycerin er; Accolate; Allopurinol; Amoxicillin-pot clavulanate; Ciprofloxacin; Codeine; Doxycycline; Fexofenadine; Keflex; Morphine; Septra; Sulfa drugs  cross reactors; and Sulfonamide derivatives  Home Medications   Prior to Admission medications   Medication Sig Start Date End Date Taking? Authorizing Provider  acetaminophen (TYLENOL) 500 MG tablet Take 1,000 mg by mouth every 6 (six) hours as needed for mild pain. For pain   Yes Historical Provider, MD  albuterol (PROVENTIL) (2.5 MG/3ML) 0.083% nebulizer solution Take 2.5 mg by nebulization every 4 (four) hours as needed. For shortness of breath 05/28/11  Yes Chesley Mires, MD  ALPRAZolam Duanne Moron) 0.5 MG tablet Take 0.5 mg by mouth 2 (two) times daily as needed. For anxiety   Yes Historical Provider, MD  atorvastatin (LIPITOR) 10 MG tablet Take 10 mg by mouth at bedtime.    Yes Historical Provider, MD  B Complex Vitamins (VITAMIN B COMPLEX PO) Take 1 tablet by mouth daily.    Yes Historical Provider, MD  BD INSULIN SYRINGE ULTRAFINE 31G X 5/16" 0.3 ML MISC  05/17/14  Yes Historical Provider, MD  budesonide (PULMICORT) 0.25 MG/2ML nebulizer solution Take 0.25 mg by nebulization 2 (two) times daily as needed (shortness of breath).   Yes Historical Provider, MD  bumetanide (BUMEX) 2 MG tablet Take 1 tablet (2 mg total) by mouth daily. 10/02/13  Yes Jettie Booze, MD  busPIRone (BUSPAR) 5 MG tablet Take 7.5 mg by mouth 2 (two) times daily.    Yes Historical Provider, MD  Canagliflozin (INVOKANA) 100 MG TABS Take 100 mg by mouth daily.    Yes Historical Provider, MD  cholestyramine Lucrezia Starch) 4 G packet Take 1 packet by mouth as needed. For loose stools, IBS.   Yes Historical Provider, MD  clotrimazole (LOTRIMIN) 1 % cream Apply 1 application topically daily. To stomach   Yes Historical Provider, MD  colchicine 0.6 MG tablet Take 0.6 mg by mouth daily.   Yes Historical Provider, MD  DULoxetine (CYMBALTA) 30 MG capsule TAKE 1 CAPSULE (30 MG TOTAL) BY MOUTH 2 (TWO) TIMES DAILY. 06/01/14  Yes Donika K Patel, DO  ergocalciferol (VITAMIN D2) 50000 UNITS capsule Take 50,000 Units by mouth once a week.  Take on Fridays   Yes Historical Provider, MD  Febuxostat (ULORIC) 80 MG TABS Take 80 mg by mouth at bedtime.    Yes Historical Provider, MD  fesoterodine (TOVIAZ) 4 MG TB24 Take 4 mg by mouth daily.     Yes Historical Provider, MD  fish oil-omega-3 fatty acids 1000 MG capsule Take 1 g by mouth 2 (two) times daily.    Yes Historical Provider, MD  gabapentin (NEURONTIN) 100 MG capsule TAKE ONE CAPSULE BY MOUTH EVERY MORNING AND  2 CAPS IN THE AFTERNOON AND 2 CAPS AT BEDTIME Patient taking differently: Take 100 mg by mouth 4 (four) times daily. TAKE ONE CAPSULE BY MOUTH EVERY MORNING AND 1 CAPS AT LUNCH TIME, 1 AT [facility] AND 2 CAPS AT BEDTIME 05/13/14  Yes Donika K Patel, DO  guaiFENesin (MUCINEX) 600 MG 12 hr tablet Take 600 mg by mouth 2 (two) times daily.    Yes Historical Provider, MD  HYDROcodone-acetaminophen (NORCO/VICODIN) 5-325 MG per tablet Take 1 tablet by mouth every 6 (six) hours as needed for moderate pain.    Yes Historical Provider, MD  insulin regular human CONCENTRATED (HUMULIN R) 500 UNIT/ML SOLN injection Inject 28 Units into the skin 2 (two) times daily before a meal.    Yes Historical Provider, MD  ipratropium (ATROVENT) 0.02 % nebulizer solution Take 0.5 mg by nebulization 4 (four) times daily as needed for wheezing or shortness of breath.   Yes Historical Provider, MD  Liraglutide (VICTOZA) 18 MG/3ML SOLN injection Inject 1.8 mg into the skin daily.   Yes Historical Provider, MD  losartan (COZAAR) 100 MG tablet Take 1 tablet (166m) by mouth daily. 10/02/13  Yes JJettie Booze MD  meclizine (ANTIVERT) 25 MG tablet Take 25 mg by mouth at bedtime.    Yes Historical Provider, MD  metolazone (ZAROXOLYN) 5 MG tablet Take 1 tablet (5 mg total) by mouth See admin instructions. 3 times weekly as needed for edema 08/05/13  Yes JJettie Booze MD  Multiple Vitamin (MULTIVITAMIN) capsule Take 1 capsule by mouth daily.     Yes Historical Provider, MD  nebivolol (BYSTOLIC) 10 MG  tablet Take 10 mg by mouth daily.   Yes Historical Provider, MD  omeprazole (PRILOSEC) 40 MG capsule Take 40 mg by mouth 2 (two) times daily.    Yes Historical Provider, MD  ondansetron (ZOFRAN) 4 MG tablet Take 1 tablet (4 mg total) by mouth every 8 (eight) hours as needed for nausea. 05/18/14  Yes ENoland Fordyce PA-C  oxyCODONE-acetaminophen (PERCOCET) 10-325 MG per tablet Take 1 tablet by mouth every 4 (four) hours as needed for pain. 06/03/14  Yes GDaryll Brod MD  potassium chloride SA (K-DUR,KLOR-CON) 20 MEQ tablet Take 20 mEq by mouth 4 (four) times daily. 1 in AM, 2 at lunch, 1 at supper and 2 at bedtime   Yes Historical Provider, MD  primidone (MYSOLINE) 50 MG tablet TAKE ONE TABLET TWICE DAILY 06/16/14  Yes Donika KKeith Rake DO  Probiotic Product (PROBIOTIC PO) Take 1 tablet by mouth daily.    Yes Historical Provider, MD  warfarin (COUMADIN) 5 MG tablet Take 5 mg by mouth daily. Take 5 mg on Monday, Tuesday, Wednesday, Thursday, Friday, and Saturday. Take 2.574m(half a tablet) on Sunday.   Yes Historical Provider, MD  amoxicillin-clavulanate (AUGMENTIN) 875-125 MG per tablet Take 1 tablet by mouth 2 (two) times daily. Patient not taking: Reported on 06/11/2014 06/03/14   GaDaryll BrodMD   BP 102/62 mmHg  Pulse 70  Temp(Src) 98.2 F (36.8 C) (Oral)  Resp 20  SpO2 99%  LMP 05/17/2014 (Exact Date) Physical Exam  Constitutional: She appears well-developed and well-nourished. No distress.  Morbidly obese, appears chronically ill  Eyes: Conjunctivae are normal.  Neck:  Tracheostomy in place  Musculoskeletal:  Distal phalanx of the right ring finger erythematous, swollen, tender. Sutures in place. No active drainage.  Neurological: She is alert.  Skin: Skin is warm and dry.  Nursing note and vitals reviewed.   ED Course  Procedures (including critical care time) Labs Review Labs Reviewed - No data to display  Imaging Review No results found.   EKG Interpretation None       MDM   Final diagnoses:  Osteomyelitis of finger of right hand    Spoke with Dr. Burney Gauze, wants pt to received cubicin today and tomorrow IV, does not want admission. Pt has follow up on Monday. Pharmacy has prescription for pt's cubicin for today's and tomorrow's doses.   5:21 PM Pt finished antibiotics. No complaints. She is to return tomorrow for another dose.   Filed Vitals:   06/19/14 1419  BP: 102/62  Pulse: 70  Temp: 98.2 F (36.8 C)  Resp: [address] A Jaiceon Collister, PA-C 06/19/14 1721  Debby Freiberg, MD 06/26/14 1049

## 2014-06-19 NOTE — ED Notes (Signed)
Pt sent from hand center for IV antibiotics to be given today and tomorrow.  Cubicin 4mg /kg.

## 2014-06-20 ENCOUNTER — Encounter (HOSPITAL_COMMUNITY): Payer: Self-pay | Admitting: Emergency Medicine

## 2014-06-20 ENCOUNTER — Emergency Department (HOSPITAL_COMMUNITY)
Admission: EM | Admit: 2014-06-20 | Discharge: 2014-06-20 | Disposition: A | Discharge status: Home / Self Care | Payer: Medicare Other | Attending: Emergency Medicine | Admitting: Emergency Medicine

## 2014-06-20 DIAGNOSIS — Z86718 Personal history of other venous thrombosis and embolism: Secondary | ICD-10-CM | POA: Diagnosis not present

## 2014-06-20 DIAGNOSIS — Z8619 Personal history of other infectious and parasitic diseases: Secondary | ICD-10-CM | POA: Diagnosis not present

## 2014-06-20 DIAGNOSIS — J45909 Unspecified asthma, uncomplicated: Secondary | ICD-10-CM | POA: Diagnosis not present

## 2014-06-20 DIAGNOSIS — E785 Hyperlipidemia, unspecified: Secondary | ICD-10-CM | POA: Diagnosis not present

## 2014-06-20 DIAGNOSIS — Z794 Long term (current) use of insulin: Secondary | ICD-10-CM | POA: Insufficient documentation

## 2014-06-20 DIAGNOSIS — I509 Heart failure, unspecified: Secondary | ICD-10-CM | POA: Diagnosis not present

## 2014-06-20 DIAGNOSIS — Z7901 Long term (current) use of anticoagulants: Secondary | ICD-10-CM | POA: Insufficient documentation

## 2014-06-20 DIAGNOSIS — Z79899 Other long term (current) drug therapy: Secondary | ICD-10-CM | POA: Diagnosis not present

## 2014-06-20 DIAGNOSIS — F329 Major depressive disorder, single episode, unspecified: Secondary | ICD-10-CM | POA: Insufficient documentation

## 2014-06-20 DIAGNOSIS — E119 Type 2 diabetes mellitus without complications: Secondary | ICD-10-CM | POA: Insufficient documentation

## 2014-06-20 DIAGNOSIS — M1990 Unspecified osteoarthritis, unspecified site: Secondary | ICD-10-CM | POA: Insufficient documentation

## 2014-06-20 DIAGNOSIS — K219 Gastro-esophageal reflux disease without esophagitis: Secondary | ICD-10-CM | POA: Insufficient documentation

## 2014-06-20 DIAGNOSIS — G629 Polyneuropathy, unspecified: Secondary | ICD-10-CM | POA: Insufficient documentation

## 2014-06-20 DIAGNOSIS — M869 Osteomyelitis, unspecified: Secondary | ICD-10-CM

## 2014-06-20 DIAGNOSIS — M109 Gout, unspecified: Secondary | ICD-10-CM | POA: Diagnosis not present

## 2014-06-20 DIAGNOSIS — M868X4 Other osteomyelitis, hand: Secondary | ICD-10-CM | POA: Diagnosis not present

## 2014-06-20 DIAGNOSIS — F419 Anxiety disorder, unspecified: Secondary | ICD-10-CM | POA: Insufficient documentation

## 2014-06-20 DIAGNOSIS — Z792 Long term (current) use of antibiotics: Secondary | ICD-10-CM | POA: Insufficient documentation

## 2014-06-20 DIAGNOSIS — N189 Chronic kidney disease, unspecified: Secondary | ICD-10-CM | POA: Insufficient documentation

## 2014-06-20 DIAGNOSIS — Z880 Allergy status to penicillin: Secondary | ICD-10-CM | POA: Diagnosis not present

## 2014-06-20 MED ORDER — SODIUM CHLORIDE 0.9 % IV SOLN
750.0000 mg | Freq: Once | INTRAVENOUS | Status: AC
  Administered 2014-06-20: 750 mg via INTRAVENOUS
  Filled 2014-06-20: qty 15

## 2014-06-20 NOTE — ED Notes (Addendum)
Pt here for second antibiotics infusion of Cubicin for amputation of right ring finger.

## 2014-06-20 NOTE — ED Provider Notes (Signed)
CSN: [id number]     Arrival date & time 06/20/14  1215 History   First MD Initiated Contact with Patient 06/20/14 1329     Chief Complaint  Patient presents with  . IV Medication      HPI Patient is here to receive an IV dose of daptomycin for osteomyelitis of her right ring finger.  She was seen yesterday in the emergency department for the same.  She has no new complaints today.  She is scheduled to see her hand surgeon for follow-up tomorrow.  Daptomycin his antibiotic has been ordered by her hand team.   Past Medical History  Diagnosis Date  . OSA (obstructive sleep apnea)   . Morbid obesity   . Depressive disorder, not elsewhere classified   . Diabetes mellitus   . Asthma   . Dyslipidemia   . Clostridium difficile enterocolitis 2012  . Splenomegaly 06/01/2011  . Thrombocytopenia 06/01/2011  . Hepatosplenomegaly   . Gout   . GERD (gastroesophageal reflux disease)   . Osteoarthritis   . Polyneuropathy   . Urinary incontinence   . Amenorrhea   . CHF (congestive heart failure)   . Hyperlipidemia   . Leg swelling   . Nausea   . Weakness   . Palpitations   . PONV (postoperative nausea and vomiting)   . Anginal pain     no chest pain in years  . Dysrhythmia     heart palpations  . Anxiety   . Pneumonia   . Shortness of breath   . Chronic kidney disease     overactive bladder  . Peripheral neuropathy   . DVT (deep venous thrombosis)    Past Surgical History  Procedure Laterality Date  . Cholecystectomy    . Tracheostomy  2001  . Endometrial ablation    . Skin graft    . Finger surgery      ring finger on right hand  . Esophageal dilation  2003  . Breast biopsy      needle core, left  . Dilation and curettage of uterus      times 2  . Tracheal dilitation  01/24/2012    Procedure: TRACHEAL DILITATION;  Surgeon: Melissa Montane, MD;  Location: [place];  Service: ENT;  Laterality: N/A;  Trache change with possible dilation, from size 6 to size 7 uncuffed  . Amputation  Right 11/19/2013    Procedure: AMPUTATION DIGIT;  Surgeon: Cammie Sickle, MD;  Location: [place];  Service: Orthopedics;  Laterality: Right;  Partial amputation right long finger, Debridement right ring finger   . Amputation Right 06/03/2014    Procedure: REVISION AMPUTATION RIGHT RING FINGER;  Surgeon: Daryll Brod, MD;  Location: [place];  Service: Orthopedics;  Laterality: Right;   Family History  Problem Relation Age of Onset  . Diabetes Father   . Hypertension Father   . Dementia Father     Deceased, 63  . Pneumonia Father   . Heart disease Mother   . Clotting disorder Mother   . Hypertension Mother   . Heart attack Mother     Deceased, 81  . Multiple myeloma Paternal Grandmother   . Cancer Paternal Grandmother     multiple myoloma  . Heart disease Paternal Grandfather   . Kidney disease Maternal Grandmother   . Hypertension Sister   . Cancer Paternal Aunt     breast  . Cancer Cousin     breast - all three   History  Substance Use Topics  . Smoking status: Never Smoker   . Smokeless tobacco: Never Used  . Alcohol Use: No     Comment: occasional - once or twice a year   OB History    Gravida Para Term Preterm AB TAB SAB Ectopic Multiple Living   0         0     Review of Systems  Constitutional: Negative for fever and chills.  Respiratory: Negative for cough.       Allergies  Molds & smuts; Nitroglycerin er; Accolate; Allopurinol; Amoxicillin-pot clavulanate; Ciprofloxacin; Codeine; Doxycycline; Fexofenadine; Keflex; Morphine; Septra; Sulfa drugs cross reactors; and Sulfonamide derivatives  Home Medications   Prior to Admission medications   Medication Sig Start Date End Date Taking? Authorizing Provider  acetaminophen (TYLENOL) 500 MG tablet Take 1,000 mg by mouth every 6 (six) hours as needed for mild pain. For pain   Yes Historical Provider, MD  albuterol (PROVENTIL) (2.5 MG/3ML) 0.083% nebulizer solution Take 2.5 mg  by nebulization every 4 (four) hours as needed. For shortness of breath 05/28/11  Yes Chesley Mires, MD  ALPRAZolam Duanne Moron) 0.5 MG tablet Take 0.5 mg by mouth 2 (two) times daily as needed. For anxiety   Yes Historical Provider, MD  atorvastatin (LIPITOR) 10 MG tablet Take 10 mg by mouth at bedtime.    Yes Historical Provider, MD  B Complex Vitamins (VITAMIN B COMPLEX PO) Take 1 tablet by mouth daily.    Yes Historical Provider, MD  BD INSULIN SYRINGE ULTRAFINE 31G X 5/16" 0.3 ML MISC  05/17/14  Yes Historical Provider, MD  budesonide (PULMICORT) 0.25 MG/2ML nebulizer solution Take 0.25 mg by nebulization 2 (two) times daily as needed (shortness of breath).   Yes Historical Provider, MD  bumetanide (BUMEX) 2 MG tablet Take 1 tablet (2 mg total) by mouth daily. 10/02/13  Yes Jettie Booze, MD  busPIRone (BUSPAR) 5 MG tablet Take 7.5 mg by mouth 2 (two) times daily.    Yes Historical Provider, MD  Canagliflozin (INVOKANA) 100 MG TABS Take 100 mg by mouth daily.    Yes Historical Provider, MD  cholestyramine Lucrezia Starch) 4 G packet Take 1 packet by mouth as needed. For loose stools, IBS.   Yes Historical Provider, MD  clotrimazole (LOTRIMIN) 1 % cream Apply 1 application topically daily. To stomach   Yes Historical Provider, MD  colchicine 0.6 MG tablet Take 0.6 mg by mouth daily.   Yes Historical Provider, MD  DULoxetine (CYMBALTA) 30 MG capsule TAKE 1 CAPSULE (30 MG TOTAL) BY MOUTH 2 (TWO) TIMES DAILY. 06/01/14  Yes Donika K Patel, DO  ergocalciferol (VITAMIN D2) 50000 UNITS capsule Take 50,000 Units by mouth once a week. Take on Fridays   Yes Historical Provider, MD  Febuxostat (ULORIC) 80 MG TABS Take 80 mg by mouth at bedtime.    Yes Historical Provider, MD  fesoterodine (TOVIAZ) 4 MG TB24 Take 4 mg by mouth daily.     Yes Historical Provider, MD  fish oil-omega-3 fatty acids 1000 MG capsule Take 1 g by mouth 2 (two) times daily.    Yes Historical Provider, MD  gabapentin (NEURONTIN) 100 MG capsule  TAKE ONE CAPSULE BY MOUTH EVERY MORNING AND 2 CAPS IN THE AFTERNOON AND 2 CAPS AT BEDTIME Patient taking differently: Take 100 mg by mouth 4 (four) times daily. TAKE ONE CAPSULE BY MOUTH EVERY MORNING AND 1 CAPS AT LUNCH TIME, 1 AT [facility] AND 2 CAPS AT BEDTIME 05/13/14  Yes Alda Berthold,  DO  guaiFENesin (MUCINEX) 600 MG 12 hr tablet Take 600 mg by mouth 2 (two) times daily.    Yes Historical Provider, MD  HYDROcodone-acetaminophen (NORCO/VICODIN) 5-325 MG per tablet Take 1 tablet by mouth every 6 (six) hours as needed for moderate pain.    Yes Historical Provider, MD  insulin regular human CONCENTRATED (HUMULIN R) 500 UNIT/ML SOLN injection Inject 28 Units into the skin 2 (two) times daily before a meal.    Yes Historical Provider, MD  ipratropium (ATROVENT) 0.02 % nebulizer solution Take 0.5 mg by nebulization 4 (four) times daily as needed for wheezing or shortness of breath.   Yes Historical Provider, MD  Liraglutide (VICTOZA) 18 MG/3ML SOLN injection Inject 1.8 mg into the skin daily.   Yes Historical Provider, MD  losartan (COZAAR) 100 MG tablet Take 1 tablet (129m) by mouth daily. 10/02/13  Yes JJettie Booze MD  meclizine (ANTIVERT) 25 MG tablet Take 25 mg by mouth at bedtime.    Yes Historical Provider, MD  metolazone (ZAROXOLYN) 5 MG tablet Take 1 tablet (5 mg total) by mouth See admin instructions. 3 times weekly as needed for edema 08/05/13  Yes JJettie Booze MD  Multiple Vitamin (MULTIVITAMIN) capsule Take 1 capsule by mouth daily.     Yes Historical Provider, MD  nebivolol (BYSTOLIC) 10 MG tablet Take 10 mg by mouth daily.   Yes Historical Provider, MD  omeprazole (PRILOSEC) 40 MG capsule Take 40 mg by mouth 2 (two) times daily.    Yes Historical Provider, MD  ondansetron (ZOFRAN) 4 MG tablet Take 1 tablet (4 mg total) by mouth every 8 (eight) hours as needed for nausea. 05/18/14  Yes ENoland Fordyce PA-C  oxyCODONE-acetaminophen (PERCOCET) 10-325 MG per tablet Take 1 tablet  by mouth every 4 (four) hours as needed for pain. 06/03/14  Yes GDaryll Brod MD  potassium chloride SA (K-DUR,KLOR-CON) 20 MEQ tablet Take 20 mEq by mouth 4 (four) times daily. 1 in AM, 2 at lunch, 1 at supper and 2 at bedtime   Yes Historical Provider, MD  primidone (MYSOLINE) 50 MG tablet TAKE ONE TABLET TWICE DAILY 06/16/14  Yes Donika KKeith Rake DO  Probiotic Product (PROBIOTIC PO) Take 1 tablet by mouth daily.    Yes Historical Provider, MD  warfarin (COUMADIN) 5 MG tablet Take 5 mg by mouth daily. Take 5 mg on Monday, Tuesday, Wednesday, Thursday, Friday, and Saturday. Take 2.542m(half a tablet) on Sunday.   Yes Historical Provider, MD  amoxicillin-clavulanate (AUGMENTIN) 875-125 MG per tablet Take 1 tablet by mouth 2 (two) times daily. Patient not taking: Reported on 06/11/2014 06/03/14   GaDaryll BrodMD   BP 138/56 mmHg  Pulse 63  Temp(Src) 98.3 F (36.8 C) (Oral)  Resp 18  SpO2 97%  LMP 05/17/2014 (Exact Date) Physical Exam  Constitutional: She is oriented to person, place, and time. She appears well-developed and well-nourished.  HENT:  Head: Normocephalic.  Eyes: EOM are normal.  Neck: Normal range of motion.  Pulmonary/Chest: Effort normal.  Abdominal: She exhibits no distension.  Musculoskeletal: Normal range of motion.  Right ring finger is wrapped in a protective sterile dressing.  Able to range her right ring finger  Neurological: She is alert and oriented to person, place, and time.  Psychiatric: She has a normal mood and affect.  Nursing note and vitals reviewed.   ED Course  Procedures (including critical care time) Labs Review Labs Reviewed - No data to display  Imaging Review No results  found.   EKG Interpretation None      MDM   Final diagnoses:  Osteomyelitis    IV daptomycin was given.  Patient will follow-up with her hand surgery team tomorrow.  Vitals are normal.  Well appearing.  Discharge home.    Hoy Morn, MD 06/20/14 [phone]

## 2014-06-20 NOTE — ED Notes (Signed)
Pt IV left in place for visit to surgeon tomorrow per Dr Venora Maples

## 2014-06-21 ENCOUNTER — Other Ambulatory Visit: Payer: Self-pay | Admitting: Orthopedic Surgery

## 2014-06-21 ENCOUNTER — Ambulatory Visit (HOSPITAL_COMMUNITY)
Admission: RE | Admit: 2014-06-21 | Discharge: 2014-06-21 | Disposition: A | Discharge status: Home / Self Care | Payer: Medicare Other | Source: Ambulatory Visit | Attending: Orthopedic Surgery | Admitting: Orthopedic Surgery

## 2014-06-21 ENCOUNTER — Ambulatory Visit: Payer: Self-pay

## 2014-06-21 DIAGNOSIS — M86641 Other chronic osteomyelitis, right hand: Secondary | ICD-10-CM

## 2014-06-21 DIAGNOSIS — B9562 Methicillin resistant Staphylococcus aureus infection as the cause of diseases classified elsewhere: Secondary | ICD-10-CM | POA: Diagnosis present

## 2014-06-21 DIAGNOSIS — L089 Local infection of the skin and subcutaneous tissue, unspecified: Secondary | ICD-10-CM | POA: Insufficient documentation

## 2014-06-21 MED ORDER — SODIUM CHLORIDE 0.9 % IV SOLN
750.0000 mg | Freq: Once | INTRAVENOUS | Status: AC
  Administered 2014-06-21: 750 mg via INTRAVENOUS
  Filled 2014-06-21: qty 15

## 2014-06-21 NOTE — Progress Notes (Signed)
ANTIBIOTIC CONSULT NOTE - INITIAL  Pharmacy Consult for Cubicin (daptomycin) Indication: MRSA of R-ring finger  Allergies  Allergen Reactions  . Molds & Smuts Shortness Of Breath and Rash  . Nitroglycerin Er Nausea And Vomiting  . Accolate [Zafirlukast] Other (See Comments)    Reaction unknown  . Allopurinol Other (See Comments)    GI problems   . Amoxicillin-Pot Clavulanate     Other reaction(s): Unknown  . Ciprofloxacin Other (See Comments)    GI problems  . Codeine Other (See Comments)    Heart problems  . Doxycycline Other (See Comments)    Gastric problems  . Fexofenadine Other (See Comments)    Hurts joints/pain  . Keflex [Cephalexin] Diarrhea and Nausea Only  . Morphine Other (See Comments)    unknown  . Septra [Sulfamethoxazole-Trimethoprim]   . Sulfa Drugs Cross Reactors Nausea And Vomiting    nausea  . Sulfonamide Derivatives Nausea Only    Patient Measurements:  Weight 181.4 kg from 06/11/14 Height 5'8"  Vital Signs:   Intake/Output from previous day:   Intake/Output from this shift:    Labs: No results for input(s): WBC, HGB, PLT, LABCREA, CREATININE in the last 72 hours. Estimated Creatinine Clearance: 137.2 mL/min (by C-G formula based on Cr of 0.84). No results for input(s): VANCOTROUGH, VANCOPEAK, VANCORANDOM, GENTTROUGH, GENTPEAK, GENTRANDOM, TOBRATROUGH, TOBRAPEAK, TOBRARND, AMIKACINPEAK, AMIKACINTROU, AMIKACIN in the last 72 hours.   Microbiology: Recent Results (from the past 720 hour(s))  Wound culture     Status: None   Collection Time: 06/03/14  2:01 PM  Result Value Ref Range Status   Specimen Description WOUND RIGHT FINGER  Final   Special Requests POF VANCOMYCIN  Final   Gram Stain   Final    RARE WBC PRESENT, PREDOMINANTLY MONONUCLEAR NO SQUAMOUS EPITHELIAL CELLS SEEN RARE GRAM POSITIVE COCCI IN PAIRS IN CLUSTERS Performed at [facility]    Culture   Final    FEW METHICILLIN RESISTANT STAPHYLOCOCCUS AUREUS Note:  RIFAMPIN AND GENTAMICIN SHOULD NOT BE USED AS SINGLE DRUGS FOR TREATMENT OF STAPH INFECTIONS. This organism is presumed to be Clindamycin resistant based on detection of inducible Clindamycin resistance. GROUP B STREP(S.AGALACTIAE)ISOLATED Note: TESTING AGAINST S. AGALACTIAE NOT ROUTINELY PERFORMED DUE TO PREDICTABILITY OF AMP/PEN/VAN SUSCEPTIBILITY. Performed at [facility]    Report Status 06/07/2014 FINAL  Final   Organism ID, Bacteria METHICILLIN RESISTANT STAPHYLOCOCCUS AUREUS  Final      Susceptibility   Methicillin resistant staphylococcus aureus - MIC*    CLINDAMYCIN RESISTANT      ERYTHROMYCIN RESISTANT      GENTAMICIN <=0.5 SENSITIVE Sensitive     LEVOFLOXACIN >=8 RESISTANT Resistant     OXACILLIN >=4 RESISTANT Resistant     PENICILLIN >=0.5 RESISTANT Resistant     RIFAMPIN <=0.5 SENSITIVE Sensitive     TRIMETH/SULFA <=10 SENSITIVE Sensitive     VANCOMYCIN <=0.5 SENSITIVE Sensitive     TETRACYCLINE <=1 SENSITIVE Sensitive     * FEW METHICILLIN RESISTANT STAPHYLOCOCCUS AUREUS  Anaerobic culture     Status: None   Collection Time: 06/03/14  2:03 PM  Result Value Ref Range Status   Specimen Description WOUND RIGHT FINGER  Final   Special Requests POF VANCOMYCIN  Final   Gram Stain   Final    RARE WBC PRESENT, PREDOMINANTLY MONONUCLEAR NO SQUAMOUS EPITHELIAL CELLS SEEN RARE GRAM POSITIVE COCCI IN PAIRS IN CLUSTERS Performed at [facility]    Culture   Final    NO ANAEROBES ISOLATED Performed at  [facility]    Report Status 06/08/2014 FINAL  Final    Medical History: Past Medical History  Diagnosis Date  . OSA (obstructive sleep apnea)   . Morbid obesity   . Depressive disorder, not elsewhere classified   . Diabetes mellitus   . Asthma   . Dyslipidemia   . Clostridium difficile enterocolitis 2012  . Splenomegaly 06/01/2011  . Thrombocytopenia 06/01/2011  . Hepatosplenomegaly   . Gout   . GERD (gastroesophageal reflux disease)   .  Osteoarthritis   . Polyneuropathy   . Urinary incontinence   . Amenorrhea   . CHF (congestive heart failure)   . Hyperlipidemia   . Leg swelling   . Nausea   . Weakness   . Palpitations   . PONV (postoperative nausea and vomiting)   . Anginal pain     no chest pain in years  . Dysrhythmia     heart palpations  . Anxiety   . Pneumonia   . Shortness of breath   . Chronic kidney disease     overactive bladder  . Peripheral neuropathy   . DVT (deep venous thrombosis)     Medications:  Anti-infectives    None     Assessment: 53 year old female in the infusion center to receive daptomycin per pharmacy dosing as an add-on today.   Per records in [place]- last dose of daptomycin was received on 06/20/14 at 14:25 PM. Patient was afebrile.   Last CBC on 12/18 was within normal limits. No blood cultures populate for this patient in microbiology review. SCr at that time was 0.84 with estimated CrCl >100 mL/min.   Records show pt with Osteomyelitis of right ring finger, had amputation revision on 12/10 by [name].  Goal of Therapy:  Clinical resolution of infection  Plan:  -Daptomycin 4mg /kg (~750mg ) once today in short stay.  -(Typical duration of once daily therapy is for total of 7-14 days).  -CK should be checked weekly while on this medication.  Sloan Leiter, PharmD, BCPS Clinical Pharmacist [phone] 06/21/2014,12:37 PM

## 2014-06-22 ENCOUNTER — Ambulatory Visit (HOSPITAL_COMMUNITY)
Admission: RE | Admit: 2014-06-22 | Discharge: 2014-06-22 | Disposition: A | Discharge status: Home / Self Care | Payer: Medicare Other | Source: Ambulatory Visit | Attending: Orthopedic Surgery | Admitting: Orthopedic Surgery

## 2014-06-22 ENCOUNTER — Other Ambulatory Visit: Payer: Self-pay | Admitting: Orthopedic Surgery

## 2014-06-22 ENCOUNTER — Encounter (HOSPITAL_COMMUNITY)
Admission: RE | Admit: 2014-06-22 | Discharge: 2014-06-22 | Disposition: A | Discharge status: Home / Self Care | Payer: Medicare Other | Source: Ambulatory Visit | Attending: Orthopedic Surgery | Admitting: Orthopedic Surgery

## 2014-06-22 DIAGNOSIS — M86641 Other chronic osteomyelitis, right hand: Secondary | ICD-10-CM

## 2014-06-22 DIAGNOSIS — L03113 Cellulitis of right upper limb: Secondary | ICD-10-CM | POA: Insufficient documentation

## 2014-06-22 DIAGNOSIS — A4902 Methicillin resistant Staphylococcus aureus infection, unspecified site: Secondary | ICD-10-CM | POA: Diagnosis present

## 2014-06-22 DIAGNOSIS — Z5181 Encounter for therapeutic drug level monitoring: Secondary | ICD-10-CM | POA: Insufficient documentation

## 2014-06-22 MED ORDER — HEPARIN SOD (PORK) LOCK FLUSH 100 UNIT/ML IV SOLN
500.0000 [IU] | Freq: Once | INTRAVENOUS | Status: AC
  Administered 2014-06-22: 250 [IU] via INTRAVENOUS

## 2014-06-22 MED ORDER — HEPARIN SOD (PORK) LOCK FLUSH 100 UNIT/ML IV SOLN
INTRAVENOUS | Status: AC
  Administered 2014-06-22: 250 [IU] via INTRAVENOUS
  Filled 2014-06-22: qty 5

## 2014-06-22 MED ORDER — HEPARIN SOD (PORK) LOCK FLUSH 100 UNIT/ML IV SOLN
INTRAVENOUS | Status: AC
  Filled 2014-06-22: qty 5

## 2014-06-22 MED ORDER — SODIUM CHLORIDE 0.9 % IV SOLN
750.0000 mg | Freq: Once | INTRAVENOUS | Status: AC
  Administered 2014-06-22: 750 mg via INTRAVENOUS
  Filled 2014-06-22: qty 15

## 2014-06-22 MED ORDER — LIDOCAINE HCL 1 % IJ SOLN
INTRAMUSCULAR | Status: AC
  Filled 2014-06-22: qty 20

## 2014-06-22 NOTE — Procedures (Signed)
Interventional Radiology Procedure Note  Procedure: Placement of a right arm 44 cm dual lumen PowerPICC via the basilic vein.  Tips in distal SVC and ready for use.  Complications: None Recommendations: - Routine line care  Signed,  Criselda Peaches, MD

## 2014-07-11 ENCOUNTER — Encounter (HOSPITAL_COMMUNITY): Payer: Self-pay | Admitting: *Deleted

## 2014-07-11 ENCOUNTER — Inpatient Hospital Stay (HOSPITAL_COMMUNITY)
Admission: EM | Admit: 2014-07-11 | Discharge: 2014-07-15 | DRG: 872 | Disposition: A | Discharge status: Home / Self Care | Payer: Medicare Other | Attending: Internal Medicine | Admitting: Internal Medicine

## 2014-07-11 ENCOUNTER — Emergency Department (HOSPITAL_COMMUNITY): Payer: Medicare Other

## 2014-07-11 DIAGNOSIS — Z6841 Body Mass Index (BMI) 40.0 and over, adult: Secondary | ICD-10-CM

## 2014-07-11 DIAGNOSIS — I5032 Chronic diastolic (congestive) heart failure: Secondary | ICD-10-CM | POA: Diagnosis present

## 2014-07-11 DIAGNOSIS — Z930 Tracheostomy status: Secondary | ICD-10-CM

## 2014-07-11 DIAGNOSIS — Z885 Allergy status to narcotic agent status: Secondary | ICD-10-CM | POA: Diagnosis not present

## 2014-07-11 DIAGNOSIS — I129 Hypertensive chronic kidney disease with stage 1 through stage 4 chronic kidney disease, or unspecified chronic kidney disease: Secondary | ICD-10-CM | POA: Diagnosis present

## 2014-07-11 DIAGNOSIS — B9620 Unspecified Escherichia coli [E. coli] as the cause of diseases classified elsewhere: Secondary | ICD-10-CM | POA: Diagnosis present

## 2014-07-11 DIAGNOSIS — Z8701 Personal history of pneumonia (recurrent): Secondary | ICD-10-CM

## 2014-07-11 DIAGNOSIS — N189 Chronic kidney disease, unspecified: Secondary | ICD-10-CM | POA: Diagnosis present

## 2014-07-11 DIAGNOSIS — G629 Polyneuropathy, unspecified: Secondary | ICD-10-CM | POA: Diagnosis present

## 2014-07-11 DIAGNOSIS — M1990 Unspecified osteoarthritis, unspecified site: Secondary | ICD-10-CM | POA: Diagnosis present

## 2014-07-11 DIAGNOSIS — E119 Type 2 diabetes mellitus without complications: Secondary | ICD-10-CM | POA: Diagnosis present

## 2014-07-11 DIAGNOSIS — M868X8 Other osteomyelitis, other site: Secondary | ICD-10-CM | POA: Diagnosis present

## 2014-07-11 DIAGNOSIS — Z888 Allergy status to other drugs, medicaments and biological substances status: Secondary | ICD-10-CM | POA: Diagnosis not present

## 2014-07-11 DIAGNOSIS — R32 Unspecified urinary incontinence: Secondary | ICD-10-CM | POA: Diagnosis present

## 2014-07-11 DIAGNOSIS — J45909 Unspecified asthma, uncomplicated: Secondary | ICD-10-CM | POA: Diagnosis present

## 2014-07-11 DIAGNOSIS — E669 Obesity, unspecified: Secondary | ICD-10-CM

## 2014-07-11 DIAGNOSIS — Z86718 Personal history of other venous thrombosis and embolism: Secondary | ICD-10-CM

## 2014-07-11 DIAGNOSIS — Z794 Long term (current) use of insulin: Secondary | ICD-10-CM | POA: Diagnosis not present

## 2014-07-11 DIAGNOSIS — M109 Gout, unspecified: Secondary | ICD-10-CM | POA: Diagnosis present

## 2014-07-11 DIAGNOSIS — R002 Palpitations: Secondary | ICD-10-CM | POA: Diagnosis present

## 2014-07-11 DIAGNOSIS — Z9049 Acquired absence of other specified parts of digestive tract: Secondary | ICD-10-CM | POA: Diagnosis present

## 2014-07-11 DIAGNOSIS — E662 Morbid (severe) obesity with alveolar hypoventilation: Secondary | ICD-10-CM | POA: Diagnosis present

## 2014-07-11 DIAGNOSIS — R251 Tremor, unspecified: Secondary | ICD-10-CM | POA: Diagnosis present

## 2014-07-11 DIAGNOSIS — N390 Urinary tract infection, site not specified: Secondary | ICD-10-CM | POA: Diagnosis present

## 2014-07-11 DIAGNOSIS — K219 Gastro-esophageal reflux disease without esophagitis: Secondary | ICD-10-CM | POA: Diagnosis present

## 2014-07-11 DIAGNOSIS — E1169 Type 2 diabetes mellitus with other specified complication: Secondary | ICD-10-CM

## 2014-07-11 DIAGNOSIS — E785 Hyperlipidemia, unspecified: Secondary | ICD-10-CM | POA: Diagnosis present

## 2014-07-11 DIAGNOSIS — R509 Fever, unspecified: Secondary | ICD-10-CM | POA: Diagnosis present

## 2014-07-11 DIAGNOSIS — Z882 Allergy status to sulfonamides status: Secondary | ICD-10-CM

## 2014-07-11 DIAGNOSIS — M8660 Other chronic osteomyelitis, unspecified site: Secondary | ICD-10-CM | POA: Diagnosis present

## 2014-07-11 DIAGNOSIS — E876 Hypokalemia: Secondary | ICD-10-CM | POA: Diagnosis present

## 2014-07-11 DIAGNOSIS — F329 Major depressive disorder, single episode, unspecified: Secondary | ICD-10-CM | POA: Diagnosis present

## 2014-07-11 DIAGNOSIS — N3281 Overactive bladder: Secondary | ICD-10-CM | POA: Diagnosis present

## 2014-07-11 DIAGNOSIS — F419 Anxiety disorder, unspecified: Secondary | ICD-10-CM | POA: Diagnosis present

## 2014-07-11 DIAGNOSIS — R05 Cough: Secondary | ICD-10-CM

## 2014-07-11 DIAGNOSIS — Z7901 Long term (current) use of anticoagulants: Secondary | ICD-10-CM | POA: Diagnosis not present

## 2014-07-11 DIAGNOSIS — Z881 Allergy status to other antibiotic agents status: Secondary | ICD-10-CM | POA: Diagnosis not present

## 2014-07-11 DIAGNOSIS — R059 Cough, unspecified: Secondary | ICD-10-CM

## 2014-07-11 DIAGNOSIS — A419 Sepsis, unspecified organism: Principal | ICD-10-CM | POA: Diagnosis present

## 2014-07-11 DIAGNOSIS — Z89021 Acquired absence of right finger(s): Secondary | ICD-10-CM | POA: Diagnosis not present

## 2014-07-11 DIAGNOSIS — L03113 Cellulitis of right upper limb: Secondary | ICD-10-CM | POA: Diagnosis present

## 2014-07-11 LAB — I-STAT ARTERIAL BLOOD GAS, ED
Acid-Base Excess: 3 mmol/L — ABNORMAL HIGH (ref 0.0–2.0)
Bicarbonate: 25.6 mEq/L — ABNORMAL HIGH (ref 20.0–24.0)
O2 Saturation: 99 %
PCO2 ART: 32.6 mmHg — AB (ref 35.0–45.0)
PH ART: 7.502 — AB (ref 7.350–7.450)
Patient temperature: 98.6
TCO2: 27 mmol/L (ref 0–100)
pO2, Arterial: 107 mmHg — ABNORMAL HIGH (ref 80.0–100.0)

## 2014-07-11 LAB — COMPREHENSIVE METABOLIC PANEL WITH GFR
ALT: 29 U/L (ref 0–35)
AST: 37 U/L (ref 0–37)
Albumin: 3.6 g/dL (ref 3.5–5.2)
Alkaline Phosphatase: 70 U/L (ref 39–117)
Anion gap: 13 (ref 5–15)
BUN: 19 mg/dL (ref 6–23)
CO2: 25 mmol/L (ref 19–32)
Calcium: 9 mg/dL (ref 8.4–10.5)
Chloride: 96 meq/L (ref 96–112)
Creatinine, Ser: 1.22 mg/dL — ABNORMAL HIGH (ref 0.50–1.10)
GFR calc Af Amer: 58 mL/min — ABNORMAL LOW
GFR calc non Af Amer: 50 mL/min — ABNORMAL LOW
Glucose, Bld: 164 mg/dL — ABNORMAL HIGH (ref 70–99)
Potassium: 3.3 mmol/L — ABNORMAL LOW (ref 3.5–5.1)
Sodium: 134 mmol/L — ABNORMAL LOW (ref 135–145)
Total Bilirubin: 0.7 mg/dL (ref 0.3–1.2)
Total Protein: 7.9 g/dL (ref 6.0–8.3)

## 2014-07-11 LAB — URINE MICROSCOPIC-ADD ON

## 2014-07-11 LAB — CBC WITH DIFFERENTIAL/PLATELET
Basophils Absolute: 0 10*3/uL (ref 0.0–0.1)
Basophils Relative: 0 % (ref 0–1)
Eosinophils Absolute: 0 10*3/uL (ref 0.0–0.7)
Eosinophils Relative: 0 % (ref 0–5)
HEMATOCRIT: 40.9 % (ref 36.0–46.0)
Hemoglobin: 13.4 g/dL (ref 12.0–15.0)
LYMPHS ABS: 1 10*3/uL (ref 0.7–4.0)
Lymphocytes Relative: 7 % — ABNORMAL LOW (ref 12–46)
MCH: 30.9 pg (ref 26.0–34.0)
MCHC: 32.8 g/dL (ref 30.0–36.0)
MCV: 94.5 fL (ref 78.0–100.0)
MONOS PCT: 10 % (ref 3–12)
Monocytes Absolute: 1.6 10*3/uL — ABNORMAL HIGH (ref 0.1–1.0)
NEUTROS ABS: 13.1 10*3/uL — AB (ref 1.7–7.7)
Neutrophils Relative %: 83 % — ABNORMAL HIGH (ref 43–77)
Platelets: 93 10*3/uL — ABNORMAL LOW (ref 150–400)
RBC: 4.33 MIL/uL (ref 3.87–5.11)
RDW: 15.5 % (ref 11.5–15.5)
WBC: 15.7 10*3/uL — AB (ref 4.0–10.5)

## 2014-07-11 LAB — URINALYSIS, ROUTINE W REFLEX MICROSCOPIC
Bilirubin Urine: NEGATIVE
Glucose, UA: >1000 mg/dL — AB
Hgb urine dipstick: NEGATIVE
Ketones, ur: NEGATIVE mg/dL
Leukocytes, UA: NEGATIVE
Nitrite: POSITIVE — AB
Protein, ur: NEGATIVE mg/dL
Specific Gravity, Urine: 1.026 (ref 1.005–1.030)
Urobilinogen, UA: 0.2 mg/dL (ref 0.0–1.0)
pH: 5.5 (ref 5.0–8.0)

## 2014-07-11 LAB — PROTIME-INR
INR: 2.54 — ABNORMAL HIGH (ref 0.00–1.49)
PROTHROMBIN TIME: 27.5 s — AB (ref 11.6–15.2)

## 2014-07-11 LAB — GLUCOSE, CAPILLARY
Glucose-Capillary: 101 mg/dL — ABNORMAL HIGH (ref 70–99)
Glucose-Capillary: 153 mg/dL — ABNORMAL HIGH (ref 70–99)

## 2014-07-11 LAB — I-STAT CG4 LACTIC ACID, ED: Lactic Acid, Venous: 2.69 mmol/L — ABNORMAL HIGH (ref 0.5–2.2)

## 2014-07-11 MED ORDER — PRIMIDONE 50 MG PO TABS
50.0000 mg | ORAL_TABLET | Freq: Two times a day (BID) | ORAL | Status: DC
  Administered 2014-07-11 – 2014-07-15 (×8): 50 mg via ORAL
  Filled 2014-07-11 (×9): qty 1

## 2014-07-11 MED ORDER — SODIUM CHLORIDE 0.9 % IV SOLN: 1000.0000 mL | Freq: Once | INTRAVENOUS | Status: DC

## 2014-07-11 MED ORDER — BUDESONIDE 0.25 MG/2ML IN SUSP: 0.2500 mg | Freq: Two times a day (BID) | RESPIRATORY_TRACT | Status: DC | PRN

## 2014-07-11 MED ORDER — CETYLPYRIDINIUM CHLORIDE 0.05 % MT LIQD
7.0000 mL | Freq: Two times a day (BID) | OROMUCOSAL | Status: DC
  Administered 2014-07-12 – 2014-07-15 (×6): 7 mL via OROMUCOSAL

## 2014-07-11 MED ORDER — BUSPIRONE HCL 15 MG PO TABS
7.5000 mg | ORAL_TABLET | Freq: Two times a day (BID) | ORAL | Status: DC
  Administered 2014-07-11 – 2014-07-15 (×8): 7.5 mg via ORAL
  Filled 2014-07-11 (×9): qty 1

## 2014-07-11 MED ORDER — ATORVASTATIN CALCIUM 10 MG PO TABS: 10.0000 mg | ORAL_TABLET | Freq: Every day | ORAL | Status: DC

## 2014-07-11 MED ORDER — CHLORHEXIDINE GLUCONATE 0.12 % MT SOLN
15.0000 mL | Freq: Two times a day (BID) | OROMUCOSAL | Status: DC
  Administered 2014-07-11 – 2014-07-15 (×7): 15 mL via OROMUCOSAL
  Filled 2014-07-11 (×10): qty 15

## 2014-07-11 MED ORDER — WARFARIN SODIUM 2.5 MG PO TABS
2.5000 mg | ORAL_TABLET | Freq: Once | ORAL | Status: AC
  Administered 2014-07-11: 2.5 mg via ORAL
  Filled 2014-07-11: qty 1

## 2014-07-11 MED ORDER — GUAIFENESIN-DM 100-10 MG/5ML PO SYRP
5.0000 mL | ORAL_SOLUTION | ORAL | Status: DC | PRN
  Administered 2014-07-14: 5 mL via ORAL
  Filled 2014-07-11 (×2): qty 5

## 2014-07-11 MED ORDER — NEBIVOLOL HCL 10 MG PO TABS
10.0000 mg | ORAL_TABLET | Freq: Every day | ORAL | Status: DC
  Administered 2014-07-12 – 2014-07-15 (×4): 10 mg via ORAL
  Filled 2014-07-11 (×4): qty 1

## 2014-07-11 MED ORDER — HYDROCODONE-ACETAMINOPHEN 5-325 MG PO TABS
1.0000 | ORAL_TABLET | ORAL | Status: DC | PRN
  Administered 2014-07-11: 2 via ORAL
  Administered 2014-07-13: 1 via ORAL
  Administered 2014-07-13 – 2014-07-15 (×4): 2 via ORAL
  Filled 2014-07-11: qty 2
  Filled 2014-07-11: qty 1
  Filled 2014-07-11 (×4): qty 2

## 2014-07-11 MED ORDER — SODIUM CHLORIDE 0.9 % IV SOLN
1000.0000 mL | Freq: Once | INTRAVENOUS | Status: AC
  Administered 2014-07-11: 1000 mL via INTRAVENOUS

## 2014-07-11 MED ORDER — INSULIN ASPART 100 UNIT/ML ~~LOC~~ SOLN
0.0000 [IU] | Freq: Three times a day (TID) | SUBCUTANEOUS | Status: DC
  Administered 2014-07-12: 2 [IU] via SUBCUTANEOUS
  Administered 2014-07-12: 3 [IU] via SUBCUTANEOUS
  Administered 2014-07-12: 1 [IU] via SUBCUTANEOUS
  Administered 2014-07-13: 3 [IU] via SUBCUTANEOUS
  Administered 2014-07-13 – 2014-07-14 (×3): 2 [IU] via SUBCUTANEOUS
  Administered 2014-07-14: 3 [IU] via SUBCUTANEOUS
  Administered 2014-07-14: 2 [IU] via SUBCUTANEOUS
  Administered 2014-07-15: 1 [IU] via SUBCUTANEOUS
  Administered 2014-07-15: 2 [IU] via SUBCUTANEOUS

## 2014-07-11 MED ORDER — ONDANSETRON HCL 4 MG/2ML IJ SOLN: 4.0000 mg | Freq: Four times a day (QID) | INTRAMUSCULAR | Status: DC | PRN

## 2014-07-11 MED ORDER — CHOLESTYRAMINE 4 G PO PACK
1.0000 | PACK | Freq: Three times a day (TID) | ORAL | Status: DC
  Administered 2014-07-12 (×2): 1 via ORAL
  Filled 2014-07-11 (×6): qty 1

## 2014-07-11 MED ORDER — DULOXETINE HCL 30 MG PO CPEP
30.0000 mg | ORAL_CAPSULE | Freq: Every day | ORAL | Status: DC
  Administered 2014-07-11 – 2014-07-14 (×4): 30 mg via ORAL
  Filled 2014-07-11 (×5): qty 1

## 2014-07-11 MED ORDER — DEXTROSE 5 % IV SOLN
1.0000 g | Freq: Once | INTRAVENOUS | Status: AC
  Administered 2014-07-11: 1 g via INTRAVENOUS
  Filled 2014-07-11: qty 10

## 2014-07-11 MED ORDER — COLCHICINE 0.6 MG PO TABS
0.6000 mg | ORAL_TABLET | Freq: Every day | ORAL | Status: DC
  Administered 2014-07-12 – 2014-07-15 (×4): 0.6 mg via ORAL
  Filled 2014-07-11 (×4): qty 1

## 2014-07-11 MED ORDER — POTASSIUM CHLORIDE CRYS ER 20 MEQ PO TBCR
40.0000 meq | EXTENDED_RELEASE_TABLET | Freq: Once | ORAL | Status: AC
  Administered 2014-07-11: 40 meq via ORAL
  Filled 2014-07-11: qty 2

## 2014-07-11 MED ORDER — SODIUM CHLORIDE 0.9 % IJ SOLN
3.0000 mL | Freq: Two times a day (BID) | INTRAMUSCULAR | Status: DC
  Administered 2014-07-11 – 2014-07-15 (×7): 3 mL via INTRAVENOUS

## 2014-07-11 MED ORDER — GABAPENTIN 100 MG PO CAPS
100.0000 mg | ORAL_CAPSULE | Freq: Four times a day (QID) | ORAL | Status: DC
Start: 2014-07-11 — End: 2014-07-15
  Administered 2014-07-11 – 2014-07-15 (×15): 100 mg via ORAL
  Filled 2014-07-11 (×18): qty 1

## 2014-07-11 MED ORDER — ALTEPLASE 2 MG IJ SOLR
2.0000 mg | Freq: Once | INTRAMUSCULAR | Status: AC
  Administered 2014-07-11: 2 mg
  Filled 2014-07-11: qty 2

## 2014-07-11 MED ORDER — SODIUM CHLORIDE 0.9 % IV SOLN: 1000.0000 mL | INTRAVENOUS | Status: DC

## 2014-07-11 MED ORDER — ALPRAZOLAM 0.5 MG PO TABS
0.5000 mg | ORAL_TABLET | Freq: Two times a day (BID) | ORAL | Status: DC | PRN
  Administered 2014-07-13: 0.5 mg via ORAL
  Filled 2014-07-11: qty 1

## 2014-07-11 MED ORDER — SODIUM CHLORIDE 0.9 % IV SOLN
6.0000 mg/kg | Freq: Once | INTRAVENOUS | Status: AC
  Administered 2014-07-11: 1190.5 mg via INTRAVENOUS
  Filled 2014-07-11: qty 23.81

## 2014-07-11 MED ORDER — WARFARIN - PHARMACIST DOSING INPATIENT
Freq: Every day | Status: DC
  Administered 2014-07-12 – 2014-07-14 (×2)

## 2014-07-11 MED ORDER — SODIUM CHLORIDE 0.9 % IJ SOLN
10.0000 mL | INTRAMUSCULAR | Status: DC | PRN
  Administered 2014-07-11: 10 mL
  Administered 2014-07-12: 40 mL
  Filled 2014-07-11 (×2): qty 40

## 2014-07-11 MED ORDER — INSULIN ASPART 100 UNIT/ML ~~LOC~~ SOLN
0.0000 [IU] | Freq: Every day | SUBCUTANEOUS | Status: DC
Start: 2014-07-11 — End: 2014-07-15
  Administered 2014-07-12 – 2014-07-14 (×3): 2 [IU] via SUBCUTANEOUS

## 2014-07-11 MED ORDER — POTASSIUM CHLORIDE IN NACL 40-0.9 MEQ/L-% IV SOLN
INTRAVENOUS | Status: DC
  Administered 2014-07-11: 100 mL/h via INTRAVENOUS
  Filled 2014-07-11 (×3): qty 1000

## 2014-07-11 MED ORDER — POLYETHYLENE GLYCOL 3350 17 G PO PACK: 17.0000 g | PACK | Freq: Every day | ORAL | Status: DC | PRN

## 2014-07-11 MED ORDER — ACETAMINOPHEN 325 MG PO TABS
650.0000 mg | ORAL_TABLET | Freq: Four times a day (QID) | ORAL | Status: DC | PRN
  Administered 2014-07-11: 650 mg via ORAL
  Filled 2014-07-11 (×2): qty 2

## 2014-07-11 MED ORDER — FESOTERODINE FUMARATE ER 4 MG PO TB24
4.0000 mg | ORAL_TABLET | Freq: Every day | ORAL | Status: DC
Start: 2014-07-11 — End: 2014-07-15
  Administered 2014-07-11 – 2014-07-14 (×4): 4 mg via ORAL
  Filled 2014-07-11 (×5): qty 1

## 2014-07-11 MED ORDER — ALUM & MAG HYDROXIDE-SIMETH 200-200-20 MG/5ML PO SUSP: 30.0000 mL | Freq: Four times a day (QID) | ORAL | Status: DC | PRN

## 2014-07-11 MED ORDER — ONDANSETRON HCL 4 MG PO TABS: 4.0000 mg | ORAL_TABLET | Freq: Four times a day (QID) | ORAL | Status: DC | PRN

## 2014-07-11 MED ORDER — PANTOPRAZOLE SODIUM 40 MG PO TBEC
40.0000 mg | DELAYED_RELEASE_TABLET | Freq: Every day | ORAL | Status: DC
  Administered 2014-07-12 – 2014-07-15 (×4): 40 mg via ORAL
  Filled 2014-07-11 (×3): qty 1

## 2014-07-11 MED ORDER — CANAGLIFLOZIN 100 MG PO TABS
100.0000 mg | ORAL_TABLET | Freq: Every day | ORAL | Status: DC
  Administered 2014-07-11 – 2014-07-14 (×4): 100 mg via ORAL
  Filled 2014-07-11 (×5): qty 1

## 2014-07-11 NOTE — ED Notes (Signed)
Attempted x2 to draw blood. One phlebotomist and one RN attempted without success .[name], [place] notified.

## 2014-07-11 NOTE — ED Provider Notes (Signed)
CSN: [id number]     Arrival date & time 07/11/14  1211 History   First MD Initiated Contact with Patient 07/11/14 1223     Chief Complaint  Patient presents with  . Fever  . Back Pain     (Consider location/radiation/quality/duration/timing/severity/associated sxs/prior Treatment) Patient is a 54 y.o. female presenting with fever. The history is provided by the patient. No language interpreter was used.  Fever Max temp prior to arrival:  101 Temp source:  Oral Severity:  Moderate Onset quality:  Gradual Duration:  3 days Timing:  Constant Progression:  Worsening Chronicity:  New Relieved by:  Nothing Worsened by:  Nothing tried Ineffective treatments:  None tried Associated symptoms: dysuria   Associated symptoms: no sore throat   Risk factors: recent surgery   Pt reports she has had pain in low back for 3 days.   Pt reports increasing weakness. Pt walks with a walker.  Pt is unable to walk today due to weakness.  Pt called her MD   Past Medical History  Diagnosis Date  . OSA (obstructive sleep apnea)   . Morbid obesity   . Depressive disorder, not elsewhere classified   . Diabetes mellitus   . Asthma   . Dyslipidemia   . Clostridium difficile enterocolitis 2012  . Splenomegaly 06/01/2011  . Thrombocytopenia 06/01/2011  . Hepatosplenomegaly   . Gout   . GERD (gastroesophageal reflux disease)   . Osteoarthritis   . Polyneuropathy   . Urinary incontinence   . Amenorrhea   . CHF (congestive heart failure)   . Hyperlipidemia   . Leg swelling   . Nausea   . Weakness   . Palpitations   . PONV (postoperative nausea and vomiting)   . Anginal pain     no chest pain in years  . Dysrhythmia     heart palpations  . Anxiety   . Pneumonia   . Shortness of breath   . Chronic kidney disease     overactive bladder  . Peripheral neuropathy   . DVT (deep venous thrombosis)    Past Surgical History  Procedure Laterality Date  . Cholecystectomy    . Tracheostomy  2001   . Endometrial ablation    . Skin graft    . Finger surgery      ring finger on right hand  . Esophageal dilation  2003  . Breast biopsy      needle core, left  . Dilation and curettage of uterus      times 2  . Tracheal dilitation  01/24/2012    Procedure: TRACHEAL DILITATION;  Surgeon: Melissa Montane, MD;  Location: [place];  Service: ENT;  Laterality: N/A;  Trache change with possible dilation, from size 6 to size 7 uncuffed  . Amputation Right 11/19/2013    Procedure: AMPUTATION DIGIT;  Surgeon: Cammie Sickle, MD;  Location: [place];  Service: Orthopedics;  Laterality: Right;  Partial amputation right long finger, Debridement right ring finger   . Amputation Right 06/03/2014    Procedure: REVISION AMPUTATION RIGHT RING FINGER;  Surgeon: Daryll Brod, MD;  Location: [place];  Service: Orthopedics;  Laterality: Right;   Family History  Problem Relation Age of Onset  . Diabetes Father   . Hypertension Father   . Dementia Father     Deceased, 57  . Pneumonia Father   . Heart disease Mother   . Clotting disorder Mother   . Hypertension Mother   . Heart  attack Mother     Deceased, 12  . Multiple myeloma Paternal Grandmother   . Cancer Paternal Grandmother     multiple myoloma  . Heart disease Paternal Grandfather   . Kidney disease Maternal Grandmother   . Hypertension Sister   . Cancer Paternal Aunt     breast  . Cancer Cousin     breast - all three   History  Substance Use Topics  . Smoking status: Never Smoker   . Smokeless tobacco: Never Used  . Alcohol Use: No     Comment: occasional - once or twice a year   OB History    Gravida Para Term Preterm AB TAB SAB Ectopic Multiple Living   0         0     Review of Systems  Constitutional: Positive for fever.  HENT: Negative for sore throat.   Genitourinary: Positive for dysuria.  All other systems reviewed and are negative.     Allergies  Molds & smuts; Nitroglycerin er;  Accolate; Allopurinol; Amoxicillin-pot clavulanate; Ciprofloxacin; Codeine; Doxycycline; Fexofenadine; Keflex; Morphine; Septra; Sulfa drugs cross reactors; and Sulfonamide derivatives  Home Medications   Prior to Admission medications   Medication Sig Start Date End Date Taking? Authorizing Provider  acetaminophen (TYLENOL) 500 MG tablet Take 1,000 mg by mouth every 6 (six) hours as needed for mild pain. For pain    Historical Provider, MD  albuterol (PROVENTIL) (2.5 MG/3ML) 0.083% nebulizer solution Take 2.5 mg by nebulization every 4 (four) hours as needed. For shortness of breath 05/28/11   Chesley Mires, MD  ALPRAZolam Duanne Moron) 0.5 MG tablet Take 0.5 mg by mouth 2 (two) times daily as needed. For anxiety    Historical Provider, MD  amoxicillin-clavulanate (AUGMENTIN) 875-125 MG per tablet Take 1 tablet by mouth 2 (two) times daily. Patient not taking: Reported on 06/11/2014 06/03/14   Daryll Brod, MD  atorvastatin (LIPITOR) 10 MG tablet Take 10 mg by mouth at bedtime.     Historical Provider, MD  B Complex Vitamins (VITAMIN B COMPLEX PO) Take 1 tablet by mouth daily.     Historical Provider, MD  BD INSULIN SYRINGE ULTRAFINE 31G X 5/16" 0.3 ML MISC  05/17/14   Historical Provider, MD  budesonide (PULMICORT) 0.25 MG/2ML nebulizer solution Take 0.25 mg by nebulization 2 (two) times daily as needed (shortness of breath).    Historical Provider, MD  bumetanide (BUMEX) 2 MG tablet Take 1 tablet (2 mg total) by mouth daily. 10/02/13   Jettie Booze, MD  busPIRone (BUSPAR) 5 MG tablet Take 7.5 mg by mouth 2 (two) times daily.     Historical Provider, MD  Canagliflozin (INVOKANA) 100 MG TABS Take 100 mg by mouth daily.     Historical Provider, MD  cholestyramine Lucrezia Starch) 4 G packet Take 1 packet by mouth as needed. For loose stools, IBS.    Historical Provider, MD  clotrimazole (LOTRIMIN) 1 % cream Apply 1 application topically daily. To stomach    Historical Provider, MD  colchicine 0.6 MG tablet  Take 0.6 mg by mouth daily.    Historical Provider, MD  DULoxetine (CYMBALTA) 30 MG capsule TAKE 1 CAPSULE (30 MG TOTAL) BY MOUTH 2 (TWO) TIMES DAILY. 06/01/14   Donika Keith Rake, DO  ergocalciferol (VITAMIN D2) 50000 UNITS capsule Take 50,000 Units by mouth once a week. Take on Fridays    Historical Provider, MD  Febuxostat (ULORIC) 80 MG TABS Take 80 mg by mouth at bedtime.  Historical Provider, MD  fesoterodine (TOVIAZ) 4 MG TB24 Take 4 mg by mouth daily.      Historical Provider, MD  fish oil-omega-3 fatty acids 1000 MG capsule Take 1 g by mouth 2 (two) times daily.     Historical Provider, MD  gabapentin (NEURONTIN) 100 MG capsule TAKE ONE CAPSULE BY MOUTH EVERY MORNING AND 2 CAPS IN THE AFTERNOON AND 2 CAPS AT BEDTIME Patient taking differently: Take 100 mg by mouth 4 (four) times daily. TAKE ONE CAPSULE BY MOUTH EVERY MORNING AND 1 CAPS AT LUNCH TIME, 1 AT [facility] AND 2 CAPS AT BEDTIME 05/13/14   Donika K Patel, DO  guaiFENesin (MUCINEX) 600 MG 12 hr tablet Take 600 mg by mouth 2 (two) times daily.     Historical Provider, MD  HYDROcodone-acetaminophen (NORCO/VICODIN) 5-325 MG per tablet Take 1 tablet by mouth every 6 (six) hours as needed for moderate pain.     Historical Provider, MD  insulin regular human CONCENTRATED (HUMULIN R) 500 UNIT/ML SOLN injection Inject 28 Units into the skin 2 (two) times daily before a meal.     Historical Provider, MD  ipratropium (ATROVENT) 0.02 % nebulizer solution Take 0.5 mg by nebulization 4 (four) times daily as needed for wheezing or shortness of breath.    Historical Provider, MD  Liraglutide (VICTOZA) 18 MG/3ML SOLN injection Inject 1.8 mg into the skin daily.    Historical Provider, MD  losartan (COZAAR) 100 MG tablet Take 1 tablet (132m) by mouth daily. 10/02/13   JJettie Booze MD  meclizine (ANTIVERT) 25 MG tablet Take 25 mg by mouth at bedtime.     Historical Provider, MD  metolazone (ZAROXOLYN) 5 MG tablet Take 1 tablet (5 mg total) by mouth  See admin instructions. 3 times weekly as needed for edema 08/05/13   JJettie Booze MD  Multiple Vitamin (MULTIVITAMIN) capsule Take 1 capsule by mouth daily.      Historical Provider, MD  nebivolol (BYSTOLIC) 10 MG tablet Take 10 mg by mouth daily.    Historical Provider, MD  omeprazole (PRILOSEC) 40 MG capsule Take 40 mg by mouth 2 (two) times daily.     Historical Provider, MD  ondansetron (ZOFRAN) 4 MG tablet Take 1 tablet (4 mg total) by mouth every 8 (eight) hours as needed for nausea. 05/18/14   ENoland Fordyce PA-C  oxyCODONE-acetaminophen (PERCOCET) 10-325 MG per tablet Take 1 tablet by mouth every 4 (four) hours as needed for pain. 06/03/14   GDaryll Brod MD  potassium chloride SA (K-DUR,KLOR-CON) 20 MEQ tablet Take 20 mEq by mouth 4 (four) times daily. 1 in AM, 2 at lunch, 1 at supper and 2 at bedtime    Historical Provider, MD  primidone (MYSOLINE) 50 MG tablet TAKE ONE TABLET TWICE DAILY 06/16/14   DAlda Berthold DO  Probiotic Product (PROBIOTIC PO) Take 1 tablet by mouth daily.     Historical Provider, MD  warfarin (COUMADIN) 5 MG tablet Take 5 mg by mouth daily. Take 5 mg on Monday, Tuesday, Wednesday, Thursday, Friday, and Saturday. Take 2.554m(half a tablet) on Sunday.    Historical Provider, MD   Temp(Src) 101.6 F (38.7 C) (Rectal)  Ht 5' 5"  (1.651 m)  Wt 400 lb (181.439 kg)  BMI 66.56 kg/m2  SpO2 95%  LMP 05/17/2014 (Exact Date) Physical Exam  Constitutional: She is oriented to person, place, and time. She appears well-developed and well-nourished.  HENT:  Head: Normocephalic.  Nose: Nose normal.  Mouth/Throat: Oropharynx is clear  and moist.  Lips dry, trach in place  Eyes: Conjunctivae and EOM are normal. Pupils are equal, round, and reactive to light.  Neck: Normal range of motion.  Cardiovascular: Normal rate and normal heart sounds.   Pulmonary/Chest: Effort normal.  Abdominal: She exhibits no distension. There is no tenderness. There is no rebound and no  guarding.  Large abdominal pannis  Musculoskeletal: Normal range of motion.  Neurological: She is alert and oriented to person, place, and time.  Skin: Skin is warm.  Psychiatric: She has a normal mood and affect.  Nursing note and vitals reviewed.   ED Course  Procedures (including critical care time) Labs Review Labs Reviewed  CULTURE, BLOOD (ROUTINE X 2)  CULTURE, BLOOD (ROUTINE X 2)  URINE CULTURE  URINALYSIS, ROUTINE W REFLEX MICROSCOPIC  I-STAT CG4 LACTIC ACID, ED    Imaging Review No results found.   EKG Interpretation None      MDM Pt is on Cubicin for osteomylitis of finger.  Pt has been on since thanksgiving.  Pt has multiple drug allergies.   Pt given Iv fluids x 1 liter,  Cath ua shows many bacteria . Lactic acid is 2.69. Creat elevated to 1.22.   Pt given Rocephin Iv   Final diagnoses:  Fever and chills        Fransico Meadow, PA-C 07/11/14 [place], MD 07/12/14 [phone]

## 2014-07-11 NOTE — Progress Notes (Signed)
Patient came in with a Shiley trach 7.0 XLT placed by [facility] on 07/01/2014, patient is on room air SATS 98% during day and uses 36% via trach collar at night, patient does her own trach care and [facility] come out to her house once a month to check on patient, change trach, and check on home equipment per patient.

## 2014-07-11 NOTE — H&P (Signed)
Patient Demographics  [name], is a 54 y.o. female  MRN: [id number]   DOB - 18-Jul-1960  Admit Date - 07/11/2014  Outpatient Primary MD for the patient is [name], MD   With History of -  Past Medical History  Diagnosis Date  . OSA (obstructive sleep apnea)   . Morbid obesity   . Depressive disorder, not elsewhere classified   . Diabetes mellitus   . Asthma   . Dyslipidemia   . Clostridium difficile enterocolitis 2012  . Splenomegaly 06/01/2011  . Thrombocytopenia 06/01/2011  . Hepatosplenomegaly   . Gout   . GERD (gastroesophageal reflux disease)   . Osteoarthritis   . Polyneuropathy   . Urinary incontinence   . Amenorrhea   . CHF (congestive heart failure)   . Hyperlipidemia   . Leg swelling   . Nausea   . Weakness   . Palpitations   . PONV (postoperative nausea and vomiting)   . Anginal pain     no chest pain in years  . Dysrhythmia     heart palpations  . Anxiety   . Pneumonia   . Shortness of breath   . Chronic kidney disease     overactive bladder  . Peripheral neuropathy   . DVT (deep venous thrombosis)       Past Surgical History  Procedure Laterality Date  . Cholecystectomy    . Tracheostomy  2001  . Endometrial ablation    . Skin graft    . Finger surgery      ring finger on right hand  . Esophageal dilation  2003  . Breast biopsy      needle core, left  . Dilation and curettage of uterus      times 2  . Tracheal dilitation  01/24/2012    Procedure: TRACHEAL DILITATION;  Surgeon: Melissa Montane, MD;  Location: [facility];  Service: ENT;  Laterality: N/A;  Trache change with possible dilation, from size 6 to size 7 uncuffed  . Amputation Right 11/19/2013    Procedure: AMPUTATION DIGIT;  Surgeon: Cammie Sickle, MD;  Location: [facility];  Service:  Orthopedics;  Laterality: Right;  Partial amputation right long finger, Debridement right ring finger   . Amputation Right 06/03/2014    Procedure: REVISION AMPUTATION RIGHT RING FINGER;  Surgeon: Daryll Brod, MD;  Location: [facility];  Service: Orthopedics;  Laterality: Right;    in for   Chief Complaint  Patient presents with  . Fever  . Back Pain     HPI  [name]  is a 54 y.o. female, with history of DVT on Coumadin, morbid obesity, severe C Pap requiring tracheostomy with nighttime humidified ventilator/C Pap, DM type II, hypertension, dyslipidemia, chronic diastolic CHF, urinary incontinence, who was recently engrossed with a right Dix finger MRSA ostia mellitus by Dr.Kuzma she was found to be vancomycin allergic and therefore a PICC line was placed and she  was started on Cubicin 2 weeks ago. She was supposed to see Dr. Drucilla Schmidt infectious disease coming Wednesday, patient was doing well however she developed low-grade fevers, mild dysuria and low back pain yesterday, she came to the ER where she had fever, leukocytosis, elevated lactic acid and sepsis with a UA showing 2 WBCs, no clear-cut sign of infection was found and I was called to admit the patient with a running diagnosis of possible UTI.  Patient besides above review of systems is symptom-free, denies any headache or chest pain, no increased cough, no increased secretions from trach site, denies any open sores or wounds, no focal weakness. No diarrhea.    Review of Systems    In addition to the HPI above,   +ve Fever-chills, No Headache, No changes with Vision or hearing, No problems swallowing food or Liquids, No Chest pain, Cough or Shortness of Breath, No Abdominal pain, No Nausea or Vommitting, Bowel movements are regular, No Blood in stool or Urine, +ve dysuria, No new skin rashes or bruises, No new joints pains-aches, some dull low back pain No new weakness, tingling, numbness in any  extremity, No recent weight gain or loss, No polyuria, polydypsia or polyphagia, No significant Mental Stressors.  A full 10 point Review of Systems was done, except as stated above, all other Review of Systems were negative.   Social History History  Substance Use Topics  . Smoking status: Never Smoker   . Smokeless tobacco: Never Used  . Alcohol Use: No     Comment: occasional - once or twice a year      Family History Family History  Problem Relation Age of Onset  . Diabetes Father   . Hypertension Father   . Dementia Father     Deceased, 56  . Pneumonia Father   . Heart disease Mother   . Clotting disorder Mother   . Hypertension Mother   . Heart attack Mother     Deceased, 33  . Multiple myeloma Paternal Grandmother   . Cancer Paternal Grandmother     multiple myoloma  . Heart disease Paternal Grandfather   . Kidney disease Maternal Grandmother   . Hypertension Sister   . Cancer Paternal Aunt     breast  . Cancer Cousin     breast - all three      Prior to Admission medications   Medication Sig Start Date End Date Taking? Authorizing Provider  acetaminophen (TYLENOL) 500 MG tablet Take 1,000 mg by mouth every 6 (six) hours as needed for mild pain. For pain   Yes Historical Provider, MD  albuterol (PROVENTIL) (2.5 MG/3ML) 0.083% nebulizer solution Take 2.5 mg by nebulization every 4 (four) hours as needed. For shortness of breath 05/28/11  Yes Chesley Mires, MD  ALPRAZolam Duanne Moron) 0.5 MG tablet Take 0.5 mg by mouth 2 (two) times daily as needed. For anxiety   Yes Historical Provider, MD  atorvastatin (LIPITOR) 10 MG tablet Take 10 mg by mouth at bedtime.    Yes Historical Provider, MD  B Complex Vitamins (VITAMIN B COMPLEX PO) Take 1 tablet by mouth daily.    Yes Historical Provider, MD  budesonide (PULMICORT) 0.25 MG/2ML nebulizer solution Take 0.25 mg by nebulization 2 (two) times daily as needed (shortness of breath).   Yes Historical Provider, MD  bumetanide  (BUMEX) 2 MG tablet Take 1 tablet (2 mg total) by mouth daily. 10/02/13  Yes Jettie Booze, MD  busPIRone (BUSPAR) 5 MG tablet Take 7.5 mg  by mouth 2 (two) times daily.    Yes Historical Provider, MD  Canagliflozin (INVOKANA) 100 MG TABS Take 100 mg by mouth daily.    Yes Historical Provider, MD  cholestyramine Lucrezia Starch) 4 G packet Take 1 packet by mouth as needed. For loose stools, IBS.   Yes Historical Provider, MD  clotrimazole (LOTRIMIN) 1 % cream Apply 1 application topically daily. To stomach   Yes Historical Provider, MD  colchicine 0.6 MG tablet Take 0.6 mg by mouth daily.   Yes Historical Provider, MD  DULoxetine (CYMBALTA) 30 MG capsule TAKE 1 CAPSULE (30 MG TOTAL) BY MOUTH 2 (TWO) TIMES DAILY. 06/01/14  Yes Donika K Patel, DO  ergocalciferol (VITAMIN D2) 50000 UNITS capsule Take 50,000 Units by mouth once a week. Take on Fridays   Yes Historical Provider, MD  Febuxostat (ULORIC) 80 MG TABS Take 80 mg by mouth at bedtime.    Yes Historical Provider, MD  fesoterodine (TOVIAZ) 4 MG TB24 Take 4 mg by mouth daily.     Yes Historical Provider, MD  fish oil-omega-3 fatty acids 1000 MG capsule Take 1 g by mouth 2 (two) times daily.    Yes Historical Provider, MD  gabapentin (NEURONTIN) 100 MG capsule TAKE ONE CAPSULE BY MOUTH EVERY MORNING AND 2 CAPS IN THE AFTERNOON AND 2 CAPS AT BEDTIME Patient taking differently: Take 100 mg by mouth 4 (four) times daily. TAKE ONE CAPSULE BY MOUTH EVERY MORNING AND 1 CAPS AT LUNCH TIME, 1 AT [facility] AND 2 CAPS AT BEDTIME 05/13/14  Yes Donika K Patel, DO  guaiFENesin (MUCINEX) 600 MG 12 hr tablet Take 600 mg by mouth 2 (two) times daily.    Yes Historical Provider, MD  heparin lock flush 100 UNIT/ML SOLN injection Inject 250 Units into the vein once. Takes along wit Cubicin only   Yes Historical Provider, MD  HYDROcodone-acetaminophen (NORCO/VICODIN) 5-325 MG per tablet Take 1 tablet by mouth every 6 (six) hours as needed for moderate pain.    Yes Historical  Provider, MD  insulin regular human CONCENTRATED (HUMULIN R) 500 UNIT/ML SOLN injection Inject 28 Units into the skin 2 (two) times daily before a meal.    Yes Historical Provider, MD  ipratropium (ATROVENT) 0.02 % nebulizer solution Take 0.5 mg by nebulization 4 (four) times daily as needed for wheezing or shortness of breath.   Yes Historical Provider, MD  Liraglutide (VICTOZA) 18 MG/3ML SOLN injection Inject 1.8 mg into the skin daily.   Yes Historical Provider, MD  losartan (COZAAR) 100 MG tablet Take 1 tablet (174m) by mouth daily. 10/02/13  Yes JJettie Booze MD  meclizine (ANTIVERT) 25 MG tablet Take 25 mg by mouth at bedtime.    Yes Historical Provider, MD  metolazone (ZAROXOLYN) 5 MG tablet Take 1 tablet (5 mg total) by mouth See admin instructions. 3 times weekly as needed for edema 08/05/13  Yes JJettie Booze MD  Multiple Vitamin (MULTIVITAMIN) capsule Take 1 capsule by mouth daily.     Yes Historical Provider, MD  nebivolol (BYSTOLIC) 10 MG tablet Take 10 mg by mouth daily.   Yes Historical Provider, MD  omeprazole (PRILOSEC) 40 MG capsule Take 40 mg by mouth 2 (two) times daily.    Yes Historical Provider, MD  ondansetron (ZOFRAN) 4 MG tablet Take 1 tablet (4 mg total) by mouth every 8 (eight) hours as needed for nausea. 05/18/14  Yes ENoland Fordyce PA-C  potassium chloride SA (K-DUR,KLOR-CON) 20 MEQ tablet Take 20 mEq by mouth 4 (  four) times daily. 1 in AM, 2 at lunch, 1 at supper and 2 at bedtime   Yes Historical Provider, MD  primidone (MYSOLINE) 50 MG tablet TAKE ONE TABLET TWICE DAILY 06/16/14  Yes Donika Keith Rake, DO  Probiotic Product (PROBIOTIC PO) Take 1 tablet by mouth daily.    Yes Historical Provider, MD  warfarin (COUMADIN) 5 MG tablet Take 5 mg by mouth daily at 6 PM. Take 5 mg on Monday, Tuesday, Wednesday, Thursday, Friday, and Saturday. Take 2.69m (half a tablet) on Sunday.   Yes Historical Provider, MD  amoxicillin-clavulanate (AUGMENTIN) 875-125 MG per  tablet Take 1 tablet by mouth 2 (two) times daily. Patient not taking: Reported on 06/11/2014 06/03/14   GDaryll Brod MD    Allergies  Allergen Reactions  . Codeine Other (See Comments)    Heart problems  . Molds & Smuts Shortness Of Breath and Rash  . Allopurinol Other (See Comments)    GI problems   . Ciprofloxacin Other (See Comments)    GI problems  . Doxycycline Other (See Comments)    Gastric problems  . Fexofenadine Other (See Comments)    Hurts joints/pain  . Keflex [Cephalexin] Diarrhea and Nausea Only  . Nitroglycerin Er Nausea And Vomiting  . Sulfa Drugs Cross Reactors Nausea And Vomiting  . Sulfonamide Derivatives Nausea Only  . Accolate [Zafirlukast] Other (See Comments)    Reaction unknown  . Amoxicillin-Pot Clavulanate Other (See Comments)    Other reaction(s): Unknown  . Morphine Other (See Comments)    unknown  . Septra [Sulfamethoxazole-Trimethoprim] Other (See Comments)    unknown    Physical Exam  Vitals  Blood pressure 113/49, pulse 80, temperature 101.6 F (38.7 C), temperature source Rectal, resp. rate 22, height _0  (1.651 m), weight 181.439 kg (400 lb), last menstrual period 05/17/2014, SpO2 95 %, unknown if currently breastfeeding.   1. General or BLeory Plowmanobese middle-aged white female lying in bed in NAD,    2. Normal affect and insight, Not Suicidal or Homicidal, Awake Alert, Oriented X 3.  3. No F.N deficits, ALL C.Nerves Intact, Strength 5/5 all 4 extremities, Sensation intact all 4 extremities, Plantars down going.  4. Ears and Eyes appear Normal, Conjunctivae clear, PERRLA. Moist Oral Mucosa. Trach site appears clear,  5. Supple Neck, No JVD, No cervical lymphadenopathy appriciated, No Carotid Bruits.  6. Symmetrical Chest wall movement, Good air movement bilaterally, CTAB.  7. RRR, No Gallops, Rubs or Murmurs, No Parasternal Heave.  8. Positive Bowel Sounds, Abdomen Soft, No tenderness, No organomegaly appriciated,No rebound  -guarding or rigidity.  9.  No Cyanosis, Normal Skin Turgor, No Skin Rash or Bruise. Right index finger is partially amputated with no signs of obvious cellulitis  10. Good muscle tone,  joints appear normal , no effusions, Normal ROM.  11. No Palpable Lymph Nodes in Neck or Axillae     Data Review  CBC  Recent Labs Lab 07/11/14 1243  WBC 15.7*  HGB 13.4  HCT 40.9  PLT 93*  MCV 94.5  MCH 30.9  MCHC 32.8  RDW 15.5  LYMPHSABS 1.0  MONOABS 1.6*  EOSABS 0.0  BASOSABS 0.0   ------------------------------------------------------------------------------------------------------------------  Chemistries   Recent Labs Lab 07/11/14 1415  NA 134*  K 3.3*  CL 96  CO2 25  GLUCOSE 164*  BUN 19  CREATININE 1.22*  CALCIUM 9.0  AST 37  ALT 29  ALKPHOS 70  BILITOT 0.7   ------------------------------------------------------------------------------------------------------------------ estimated creatinine clearance is 90.9 mL/min (by C-G  formula based on Cr of 1.22). ------------------------------------------------------------------------------------------------------------------ No results for input(s): TSH, T4TOTAL, T3FREE, THYROIDAB in the last 72 hours.  Invalid input(s): FREET3   Coagulation profile No results for input(s): INR, PROTIME in the last 168 hours. ------------------------------------------------------------------------------------------------------------------- No results for input(s): DDIMER in the last 72 hours. -------------------------------------------------------------------------------------------------------------------  Cardiac Enzymes No results for input(s): CKMB, TROPONINI, MYOGLOBIN in the last 168 hours.  Invalid input(s): CK ------------------------------------------------------------------------------------------------------------------ Invalid input(s):  POCBNP   ---------------------------------------------------------------------------------------------------------------  Urinalysis    Component Value Date/Time   COLORURINE YELLOW 07/11/2014 [place] 07/11/2014 1256   LABSPEC 1.026 07/11/2014 1256   PHURINE 5.5 07/11/2014 1256   GLUCOSEU >1000* 07/11/2014 1256   HGBUR NEGATIVE 07/11/2014 1256   BILIRUBINUR NEGATIVE 07/11/2014 1256   KETONESUR NEGATIVE 07/11/2014 1256   PROTEINUR NEGATIVE 07/11/2014 1256   UROBILINOGEN 0.2 07/11/2014 1256   NITRITE POSITIVE* 07/11/2014 1256   LEUKOCYTESUR NEGATIVE 07/11/2014 1256    ----------------------------------------------------------------------------------------------------------------  Imaging results:   No results found.       Assessment & Plan   1. Sepsis. Source unclear. UA is not very impressive. Question if PICC line itself is infected. Discussed the case with Dr. Drucilla Schmidt infectious disease on-call, blood cultures have been obtained, urine cultures pending, for now continue IV Cubicin and Rocephin pharmacy to dose. If PICC line is infected obviously needs to come out. ID will formally see the patient in the morning. IV fluids and supportive care for now to be continued.   2. Morbid obesity with trach dependent obstructive sleep apnea. Continue nighttime C Pap, supportive care, trach aspirate cultured, influenza PCR ordered. Clinically no signs of respiratory tract infection.   3. DM type II. Check A1c at sliding scale, continue Invocana.   4. History of DVT. Coumadin to continue pharmacy to dose.   5. History of chronic diastolic CHF last EF 59%. Currently hypotensive, hold diuretics, ARB, and other blood pressure medications.   6. Hypertension. Currently hypotensive. Due to #1 above. All blood pressure medications and diuretics will be held, IV fluids, low-dose beta blocker to be started from tomorrow monitor blood pressure. Monitor fluid status  closely.   7. Gout. Continue coaches seen.   8. Anxiety. As needed Xanax.   9. Dyslipidemia. Continue statin at home dose     DVT Prophylaxis Coumadin  AM Labs Ordered, also please review Full Orders  Family Communication: Admission, patients condition and plan of care including tests being ordered have been discussed with the patient   who indicates understanding and agree with the plan and Code Status.  Code Status Full  Likely DC to  TBD  Condition GUARDED   Time spent in minutes : 35    SINGH,PRASHANT K M.D on 07/11/2014 at 4:34 PM  Between 7am to 7pm - Pager - [phone]  After 7pm go to [email or web address] - [facility] Office  [phone]

## 2014-07-11 NOTE — ED Notes (Signed)
iStat lactic acid: 2.69, mini lab aware results not posted in chart. Mini lab states results were called into Dr. Doy Mince.

## 2014-07-11 NOTE — ED Notes (Signed)
Pt arrived via [facility] from home. Pt has c/o low back pain x3 days with a fever beginning this morning around 0400 and pt reports her reading was 101.7. Pt states she took tylenol around 0600. Pt is currently taking Cubison for MRSA infection.

## 2014-07-11 NOTE — ED Notes (Signed)
Doy Mince, MD and [name], [place] notified of abnormal lab test results

## 2014-07-11 NOTE — Progress Notes (Signed)
Pt admitted to the unit at 1730. Pt mental status is A&Ox4. Pt oriented to room, staff, and call bell. Skin is intact other than what is charted. Full assessment charted in [facility]. Call bell within reach. Visitor guidelines reviewed w/ pt and/or family.

## 2014-07-11 NOTE — ED Notes (Signed)
Lab and Mini lab called re: pending results for lactic acid and cmp.

## 2014-07-11 NOTE — Progress Notes (Addendum)
ANTIBIOTIC + [place] for ceftriaxone, daptomycin Indication:   Allergies  Allergen Reactions  . Codeine Other (See Comments)    Heart problems  . Molds & Smuts Shortness Of Breath and Rash  . Allopurinol Other (See Comments)    GI problems   . Ciprofloxacin Other (See Comments)    GI problems  . Doxycycline Other (See Comments)    Gastric problems  . Fexofenadine Other (See Comments)    Hurts joints/pain  . Keflex [Cephalexin] Diarrhea and Nausea Only  . Nitroglycerin Er Nausea And Vomiting  . Sulfa Drugs Cross Reactors Nausea And Vomiting  . Sulfonamide Derivatives Nausea Only  . Accolate [Zafirlukast] Other (See Comments)    Reaction unknown  . Amoxicillin-Pot Clavulanate Other (See Comments)    Other reaction(s): Unknown  . Morphine Other (See Comments)    unknown  . Septra [Sulfamethoxazole-Trimethoprim] Other (See Comments)    unknown    Patient Measurements: Height: 5\' 5"  (165.1 cm) Weight: (!) 400 lb (181.439 kg) IBW/kg (Calculated) : 57 Adjusted Body Weight: 106.7 kg  Vital Signs: Temp: 100.6 F (38.1 C) (01/17 1704) Temp Source: Oral (01/17 1704) BP: 113/49 mmHg (01/17 1615) Pulse Rate: 80 (01/17 1615) Intake/Output from previous day:   Intake/Output from this shift: Total I/O In: 1500 [I.V.:1500] Out: 360 [Urine:360]  Labs:  Recent Labs  07/11/14 1243 07/11/14 1415  WBC 15.7*  --   HGB 13.4  --   PLT 93*  --   CREATININE  --  1.22*   Medical History: Past Medical History  Diagnosis Date  . OSA (obstructive sleep apnea)   . Morbid obesity   . Depressive disorder, not elsewhere classified   . Diabetes mellitus   . Asthma   . Dyslipidemia   . Clostridium difficile enterocolitis 2012  . Splenomegaly 06/01/2011  . Thrombocytopenia 06/01/2011  . Hepatosplenomegaly   . Gout   . GERD (gastroesophageal reflux disease)   . Osteoarthritis   . Polyneuropathy   . Urinary incontinence   . Amenorrhea   .  CHF (congestive heart failure)   . Hyperlipidemia   . Leg swelling   . Nausea   . Weakness   . Palpitations   . PONV (postoperative nausea and vomiting)   . Anginal pain     no chest pain in years  . Dysrhythmia     heart palpations  . Anxiety   . Pneumonia   . Shortness of breath   . Chronic kidney disease     overactive bladder  . Peripheral neuropathy   . DVT (deep venous thrombosis)     Medications:  See EMR  Assessment: 54 yo obese female with trach, requiring CPAP. 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 Pt is allergic to vancomycin and has been receiving daptomycin. Admitted with LA 2.6, WBC 15.7, Tmax/24h 101.6.  Pt is on warfarin PTA for DVT. INR 2.5, goal 2-3 PTA dose: 2.5 mg po qSun, 5 mg po on all other days  Goal of Therapy:  Resolution of infection  Plan:  -Ceftriaxone 1 g IV q24h -Pt was receiving daptomycin 4 mg/kg however due to septic presentation, will give 6 mg/kg x1 -CK weekly while on dapto -Lipitor has been dc'd while on daptomycin due to high risk of rhabdo  -Warfarin 2.5 mg po x1 per home regimen  Hughes Better, PharmD, BCPS Clinical Pharmacist Pager: [phone] 07/11/2014 5:19 PM

## 2014-07-12 DIAGNOSIS — E669 Obesity, unspecified: Secondary | ICD-10-CM

## 2014-07-12 DIAGNOSIS — R509 Fever, unspecified: Secondary | ICD-10-CM

## 2014-07-12 DIAGNOSIS — D72829 Elevated white blood cell count, unspecified: Secondary | ICD-10-CM

## 2014-07-12 DIAGNOSIS — E119 Type 2 diabetes mellitus without complications: Secondary | ICD-10-CM

## 2014-07-12 DIAGNOSIS — M869 Osteomyelitis, unspecified: Secondary | ICD-10-CM

## 2014-07-12 LAB — BASIC METABOLIC PANEL
Anion gap: 8 (ref 5–15)
BUN: 15 mg/dL (ref 6–23)
CALCIUM: 8.2 mg/dL — AB (ref 8.4–10.5)
CHLORIDE: 101 meq/L (ref 96–112)
CO2: 24 mmol/L (ref 19–32)
Creatinine, Ser: 1 mg/dL (ref 0.50–1.10)
GFR calc Af Amer: 74 mL/min — ABNORMAL LOW (ref >90)
GFR, EST NON AFRICAN AMERICAN: 64 mL/min — AB (ref >90)
Glucose, Bld: 169 mg/dL — ABNORMAL HIGH (ref 70–99)
Potassium: 3.1 mmol/L — ABNORMAL LOW (ref 3.5–5.1)
Sodium: 133 mmol/L — ABNORMAL LOW (ref 135–145)

## 2014-07-12 LAB — HEMOGLOBIN A1C
Hgb A1c MFr Bld: 7.2 % — ABNORMAL HIGH (ref <5.7)
Mean Plasma Glucose: 160 mg/dL — ABNORMAL HIGH (ref <117)

## 2014-07-12 LAB — CBC
HCT: 37.2 % (ref 36.0–46.0)
HEMOGLOBIN: 12.3 g/dL (ref 12.0–15.0)
MCH: 30.8 pg (ref 26.0–34.0)
MCHC: 33.1 g/dL (ref 30.0–36.0)
MCV: 93.2 fL (ref 78.0–100.0)
Platelets: 87 10*3/uL — ABNORMAL LOW (ref 150–400)
RBC: 3.99 MIL/uL (ref 3.87–5.11)
RDW: 15.8 % — AB (ref 11.5–15.5)
WBC: 7.3 10*3/uL (ref 4.0–10.5)

## 2014-07-12 LAB — GLUCOSE, CAPILLARY
GLUCOSE-CAPILLARY: 147 mg/dL — AB (ref 70–99)
GLUCOSE-CAPILLARY: 172 mg/dL — AB (ref 70–99)
Glucose-Capillary: 210 mg/dL — ABNORMAL HIGH (ref 70–99)
Glucose-Capillary: 212 mg/dL — ABNORMAL HIGH (ref 70–99)

## 2014-07-12 LAB — INFLUENZA PANEL BY PCR (TYPE A & B)
H1N1FLUPCR: NOT DETECTED
INFLAPCR: NEGATIVE
Influenza B By PCR: NEGATIVE

## 2014-07-12 LAB — MRSA PCR SCREENING: MRSA by PCR: POSITIVE — AB

## 2014-07-12 LAB — CK: CK TOTAL: 144 U/L (ref 7–177)

## 2014-07-12 MED ORDER — ALTEPLASE 2 MG IJ SOLR
1.0000 mg | Freq: Once | INTRAMUSCULAR | Status: DC
  Filled 2014-07-12: qty 2

## 2014-07-12 MED ORDER — CEFTRIAXONE SODIUM IN DEXTROSE 40 MG/ML IV SOLN
2.0000 g | INTRAVENOUS | Status: DC
  Administered 2014-07-12 – 2014-07-14 (×3): 2 g via INTRAVENOUS
  Filled 2014-07-12 (×3): qty 50

## 2014-07-12 MED ORDER — WARFARIN SODIUM 5 MG PO TABS
5.0000 mg | ORAL_TABLET | Freq: Once | ORAL | Status: AC
  Administered 2014-07-12: 5 mg via ORAL
  Filled 2014-07-12: qty 1

## 2014-07-12 MED ORDER — SODIUM CHLORIDE 0.9 % IV SOLN
800.0000 mg | INTRAVENOUS | Status: DC
  Filled 2014-07-12: qty 16

## 2014-07-12 MED ORDER — ALTEPLASE 2 MG IJ SOLR
2.0000 mg | Freq: Once | INTRAMUSCULAR | Status: AC
  Administered 2014-07-12: 2 mg

## 2014-07-12 MED ORDER — ALTEPLASE 2 MG IJ SOLR: 2.0000 mg | Freq: Once | INTRAMUSCULAR | Status: DC

## 2014-07-12 NOTE — Consult Note (Signed)
[facility]  Total days of antibiotics 2        Day 16 daptomycin        Day 2 ceftriaxone               Reason for Consult:fever and leukocytosis while on daptomycin for finger osteomyelitis   Referring Physician: Tana Coast  Principal Problem:   Sepsis Active Problems:   Morbid obesity   Diabetes mellitus type 2 in obese   GERD (gastroesophageal reflux disease)   Tracheostomy dependence   Current use of long term anticoagulation   Chronic diastolic heart failure   Chronic osteomyelitis   Fever    HPI: [name] is a 54 y.o. female with morbid obesity, weight of 430, OSA, OSH with trach dependence, hx of DVT, DM ,HTN, HLD who had chronic right hand ring finger, she underwent revision amputation with volar flap, right ring finger on 12/11, OR cultures grew out Group B strep as well as MRSA. She was placed on daptomycin for treatment of osteomyelitis at 56m/kg starting on 12/26. She reports being on the 2-3 weeks, however, she has been on abtx since thanksgiving. In addition, she has had episodes of cdifficile in the past. She Started having fever and leukocytosis x 2 days, possibly dysuria. Came to ed for evaluation. Now reports 3 stools today, with 2 being diarrheal.  She has numerous abtx allergies, but appears to be intolerances. Doxy made her have upset stomach, but she never premedicated with zofran   Past Medical History  Diagnosis Date  . OSA (obstructive sleep apnea)   . Morbid obesity   . Depressive disorder, not elsewhere classified   . Diabetes mellitus   . Asthma   . Dyslipidemia   . Clostridium difficile enterocolitis 2012  . Splenomegaly 06/01/2011  . Thrombocytopenia 06/01/2011  . Hepatosplenomegaly   . Gout   . GERD (gastroesophageal reflux disease)   . Osteoarthritis   . Polyneuropathy   . Urinary incontinence   . Amenorrhea   . CHF (congestive heart failure)   . Hyperlipidemia   . Leg swelling   . Nausea   . Weakness   .  Palpitations   . PONV (postoperative nausea and vomiting)   . Anginal pain     no chest pain in years  . Dysrhythmia     heart palpations  . Anxiety   . Pneumonia   . Shortness of breath   . Chronic kidney disease     overactive bladder  . Peripheral neuropathy   . DVT (deep venous thrombosis)     Allergies:  Allergies  Allergen Reactions  . Codeine Other (See Comments)    Heart problems  . Molds & Smuts Shortness Of Breath and Rash  . Allopurinol Other (See Comments)    GI problems   . Ciprofloxacin Other (See Comments)    GI problems  . Doxycycline Other (See Comments)    Gastric problems  . Fexofenadine Other (See Comments)    Hurts joints/pain  . Keflex [Cephalexin] Diarrhea and Nausea Only  . Nitroglycerin Er Nausea And Vomiting  . Sulfa Drugs Cross Reactors Nausea And Vomiting  . Sulfonamide Derivatives Nausea Only  . Accolate [Zafirlukast] Other (See Comments)    Reaction unknown  . Amoxicillin-Pot Clavulanate Other (See Comments)    Other reaction(s): Unknown  . Morphine Other (See Comments)    unknown  . Septra [Sulfamethoxazole-Trimethoprim] Other (See Comments)    unknown    MEDICATIONS: .  antiseptic oral rinse  7 mL Mouth Rinse q12n4p  . busPIRone  7.5 mg Oral BID  . canagliflozin  100 mg Oral Daily  . cefTRIAXone (ROCEPHIN)  IV  2 g Intravenous Q24H  . chlorhexidine  15 mL Mouth Rinse BID  . cholestyramine  1 packet Oral TID  . colchicine  0.6 mg Oral Daily  . DAPTOmycin (CUBICIN)  IV  800 mg Intravenous Q24H  . DULoxetine  30 mg Oral Daily  . fesoterodine  4 mg Oral Daily  . gabapentin  100 mg Oral QID  . insulin aspart  0-5 Units Subcutaneous QHS  . insulin aspart  0-9 Units Subcutaneous TID WC  . nebivolol  10 mg Oral Daily  . pantoprazole  40 mg Oral Daily  . primidone  50 mg Oral BID  . sodium chloride  3 mL Intravenous Q12H  . warfarin  5 mg Oral ONCE-1800  . Warfarin - Pharmacist Dosing Inpatient   Does not apply q1800    History    Substance Use Topics  . Smoking status: Never Smoker   . Smokeless tobacco: Never Used  . Alcohol Use: No     Comment: occasional - once or twice a year    Family History  Problem Relation Age of Onset  . Diabetes Father   . Hypertension Father   . Dementia Father     Deceased, 25  . Pneumonia Father   . Heart disease Mother   . Clotting disorder Mother   . Hypertension Mother   . Heart attack Mother     Deceased, 38  . Multiple myeloma Paternal Grandmother   . Cancer Paternal Grandmother     multiple myoloma  . Heart disease Paternal Grandfather   . Kidney disease Maternal Grandmother   . Hypertension Sister   . Cancer Paternal Aunt     breast  . Cancer Cousin     breast - all three    Review of Systems  Constitutional: Negative for fever, chills, diaphoresis, activity change, appetite change, fatigue and unexpected weight change.  HENT: Negative for congestion, sore throat, rhinorrhea, sneezing, trouble swallowing and sinus pressure.  Eyes: Negative for photophobia and visual disturbance.  Respiratory: Negative for cough, chest tightness, shortness of breath, wheezing and stridor.  Cardiovascular: Negative for chest pain, palpitations and leg swelling.  Gastrointestinal: Negative for nausea, vomiting, abdominal pain, diarrhea, constipation, blood in stool, abdominal distention and anal bleeding.  Genitourinary: Negative for dysuria, hematuria, flank pain and difficulty urinating.  Musculoskeletal: Negative for myalgias, back pain, joint swelling, arthralgias and gait problem.  Skin: Negative for color change, pallor, rash and wound.  Neurological: Negative for dizziness, tremors, weakness and light-headedness.  Hematological: Negative for adenopathy. Does not bruise/bleed easily.  Psychiatric/Behavioral: Negative for behavioral problems, confusion, sleep disturbance, dysphoric mood, decreased concentration and agitation.     OBJECTIVE: Temp:  [98.4 F (36.9  C)-100.6 F (38.1 C)] 98.4 F (36.9 C) (01/18 0611) Pulse Rate:  [58-83] 58 (01/18 1249) Resp:  [16-23] 18 (01/18 1249) BP: (105-147)/(37-61) 127/56 mmHg (01/18 0611) SpO2:  [92 %-100 %] 98 % (01/18 1249) FiO2 (%):  [21 %-28 %] 28 % (01/18 0337) Weight:  [434 lb 3.2 oz (196.952 kg)-437 lb 4.8 oz (198.358 kg)] 434 lb 3.2 oz (196.952 kg) (01/18 5643) Physical Exam  Constitutional:  oriented to person, place, and time. appears well-developed and well-nourished. No distress.  HENT: trach in place Mouth/Throat: Oropharynx is clear and moist. No oropharyngeal exudate.  Cardiovascular: Normal rate, regular  rhythm and normal heart sounds. Exam reveals no gallop and no friction rub.  No murmur heard.  Pulmonary/Chest: Effort normal and breath sounds normal. No respiratory distress.  has no wheezes.  Abdominal: Soft. Bowel sounds are normal.  exhibits no distension. There is no tenderness.  Skin: Skin is warm and dry. No rash noted. No erythema. Nail beds appear to have either onychomycosis or splitting and slowly healing. No open ulcers Psychiatric: a normal mood and affect.  behavior is normal.    LABS: Results for orders placed or performed during the hospital encounter of 07/11/14 (from the past 48 hour(s))  CBC with Differential     Status: Abnormal   Collection Time: 07/11/14 12:43 PM  Result Value Ref Range   WBC 15.7 (H) 4.0 - 10.5 K/uL   RBC 4.33 3.87 - 5.11 MIL/uL   Hemoglobin 13.4 12.0 - 15.0 g/dL   HCT 40.9 36.0 - 46.0 %   MCV 94.5 78.0 - 100.0 fL   MCH 30.9 26.0 - 34.0 pg   MCHC 32.8 30.0 - 36.0 g/dL   RDW 15.5 11.5 - 15.5 %   Platelets 93 (L) 150 - 400 K/uL    Comment: CONSISTENT WITH PREVIOUS RESULT   Neutrophils Relative % 83 (H) 43 - 77 %   Neutro Abs 13.1 (H) 1.7 - 7.7 K/uL   Lymphocytes Relative 7 (L) 12 - 46 %   Lymphs Abs 1.0 0.7 - 4.0 K/uL   Monocytes Relative 10 3 - 12 %   Monocytes Absolute 1.6 (H) 0.1 - 1.0 K/uL   Eosinophils Relative 0 0 - 5 %   Eosinophils  Absolute 0.0 0.0 - 0.7 K/uL   Basophils Relative 0 0 - 1 %   Basophils Absolute 0.0 0.0 - 0.1 K/uL  Urinalysis, Routine w reflex microscopic     Status: Abnormal   Collection Time: 07/11/14 12:56 PM  Result Value Ref Range   Color, Urine YELLOW YELLOW   APPearance CLEAR CLEAR   Specific Gravity, Urine 1.026 1.005 - 1.030   pH 5.5 5.0 - 8.0   Glucose, UA >1000 (A) NEGATIVE mg/dL   Hgb urine dipstick NEGATIVE NEGATIVE   Bilirubin Urine NEGATIVE NEGATIVE   Ketones, ur NEGATIVE NEGATIVE mg/dL   Protein, ur NEGATIVE NEGATIVE mg/dL   Urobilinogen, UA 0.2 0.0 - 1.0 mg/dL   Nitrite POSITIVE (A) NEGATIVE   Leukocytes, UA NEGATIVE NEGATIVE  Urine microscopic-add on     Status: Abnormal   Collection Time: 07/11/14 12:56 PM  Result Value Ref Range   Squamous Epithelial / LPF FEW (A) RARE   WBC, UA 0-2 <3 WBC/hpf   Bacteria, UA MANY (A) RARE  Comprehensive metabolic panel     Status: Abnormal   Collection Time: 07/11/14  2:15 PM  Result Value Ref Range   Sodium 134 (L) 135 - 145 mmol/L    Comment: Please note change in reference range.   Potassium 3.3 (L) 3.5 - 5.1 mmol/L    Comment: Please note change in reference range.   Chloride 96 96 - 112 mEq/L   CO2 25 19 - 32 mmol/L   Glucose, Bld 164 (H) 70 - 99 mg/dL   BUN 19 6 - 23 mg/dL   Creatinine, Ser 1.22 (H) 0.50 - 1.10 mg/dL   Calcium 9.0 8.4 - 10.5 mg/dL   Total Protein 7.9 6.0 - 8.3 g/dL   Albumin 3.6 3.5 - 5.2 g/dL   AST 37 0 - 37 U/L   ALT 29 0 -  35 U/L   Alkaline Phosphatase 70 39 - 117 U/L   Total Bilirubin 0.7 0.3 - 1.2 mg/dL   GFR calc non Af Amer 50 (L) >90 mL/min   GFR calc Af Amer 58 (L) >90 mL/min    Comment: (NOTE) The eGFR has been calculated using the CKD EPI equation. This calculation has not been validated in all clinical situations. eGFR's persistently <90 mL/min signify possible Chronic Kidney Disease.    Anion gap 13 5 - 15  Blood culture (routine x 2)     Status: None (Preliminary result)   Collection  Time: 07/11/14  2:18 PM  Result Value Ref Range   Specimen Description BLOOD LEFT ARM    Special Requests BOTTLES DRAWN AEROBIC AND ANAEROBIC 5.5CC    Culture             BLOOD CULTURE RECEIVED NO GROWTH TO DATE CULTURE WILL BE HELD FOR 5 DAYS BEFORE ISSUING A FINAL NEGATIVE REPORT Performed at [facility]    Report Status PENDING   Blood culture (routine x 2)     Status: None (Preliminary result)   Collection Time: 07/11/14  2:24 PM  Result Value Ref Range   Specimen Description BLOOD RIGHT WRIST    Special Requests BOTTLES DRAWN AEROBIC AND ANAEROBIC 5CC    Culture             BLOOD CULTURE RECEIVED NO GROWTH TO DATE CULTURE WILL BE HELD FOR 5 DAYS BEFORE ISSUING A FINAL NEGATIVE REPORT Performed at [facility]    Report Status PENDING   I-Stat Arterial Blood Gas, ED - (order at [facility] and [facility] only)     Status: Abnormal   Collection Time: 07/11/14  2:27 PM  Result Value Ref Range   pH, Arterial 7.502 (H) 7.350 - 7.450   pCO2 arterial 32.6 (L) 35.0 - 45.0 mmHg   pO2, Arterial 107.0 (H) 80.0 - 100.0 mmHg   Bicarbonate 25.6 (H) 20.0 - 24.0 mEq/L   TCO2 27 0 - 100 mmol/L   O2 Saturation 99.0 %   Acid-Base Excess 3.0 (H) 0.0 - 2.0 mmol/L   Patient temperature 98.6 F    Collection site RADIAL, ALLEN'S TEST ACCEPTABLE    Drawn by Operator    Sample type ARTERIAL   I-Stat CG4 Lactic Acid, ED     Status: Abnormal   Collection Time: 07/11/14  2:41 PM  Result Value Ref Range   Lactic Acid, Venous 2.69 (H) 0.5 - 2.2 mmol/L  Glucose, capillary     Status: Abnormal   Collection Time: 07/11/14  6:34 PM  Result Value Ref Range   Glucose-Capillary 101 (H) 70 - 99 mg/dL  Influenza panel by PCR (type A & B, H1N1)     Status: None   Collection Time: 07/11/14  6:38 PM  Result Value Ref Range   Influenza A By PCR NEGATIVE NEGATIVE   Influenza B By PCR NEGATIVE NEGATIVE   H1N1 flu by pcr NOT DETECTED NOT DETECTED    Comment:        The Xpert Flu assay (FDA approved for nasal  aspirates or washes and nasopharyngeal swab specimens), is intended as an aid in the diagnosis of influenza and should not be used as a sole basis for treatment.   Hemoglobin A1c     Status: Abnormal   Collection Time: 07/11/14  8:00 PM  Result Value Ref Range   Hgb A1c MFr Bld 7.2 (H) <5.7 %    Comment: (  NOTE)                                                                       According to the ADA Clinical Practice Recommendations for 2011, when HbA1c is used as a screening test:  >=6.5%   Diagnostic of Diabetes Mellitus           (if abnormal result is confirmed) 5.7-6.4%   Increased risk of developing Diabetes Mellitus References:Diagnosis and Classification of Diabetes Mellitus,Diabetes HQPR,9163,84(YKZLD 1):S62-S69 and Standards of Medical Care in         Diabetes - 2011,Diabetes JTTS,1779,39 (Suppl 1):S11-S61.    Mean Plasma Glucose 160 (H) <117 mg/dL    Comment: Performed at [facility]     Status: Abnormal   Collection Time: 07/11/14  8:00 PM  Result Value Ref Range   Prothrombin Time 27.5 (H) 11.6 - 15.2 seconds   INR 2.54 (H) 0.00 - 1.49  Glucose, capillary     Status: Abnormal   Collection Time: 07/11/14  9:24 PM  Result Value Ref Range   Glucose-Capillary 153 (H) 70 - 99 mg/dL   Comment 1 Notify RN   Culture, respiratory (NON-Expectorated)     Status: None (Preliminary result)   Collection Time: 07/11/14  9:52 PM  Result Value Ref Range   Specimen Description TRACHEAL ASPIRATE    Special Requests Normal    Gram Stain PENDING    Culture NO GROWTH Performed at [facility]     Report Status PENDING   Basic metabolic panel     Status: Abnormal   Collection Time: 07/12/14  5:54 AM  Result Value Ref Range   Sodium 133 (L) 135 - 145 mmol/L    Comment: Please note change in reference range.   Potassium 3.1 (L) 3.5 - 5.1 mmol/L    Comment: Please note change in reference range.   Chloride 101 96 - 112 mEq/L   CO2 24 19 - 32 mmol/L    Glucose, Bld 169 (H) 70 - 99 mg/dL   BUN 15 6 - 23 mg/dL   Creatinine, Ser 1.00 0.50 - 1.10 mg/dL   Calcium 8.2 (L) 8.4 - 10.5 mg/dL   GFR calc non Af Amer 64 (L) >90 mL/min   GFR calc Af Amer 74 (L) >90 mL/min    Comment: (NOTE) The eGFR has been calculated using the CKD EPI equation. This calculation has not been validated in all clinical situations. eGFR's persistently <90 mL/min signify possible Chronic Kidney Disease.    Anion gap 8 5 - 15  CBC     Status: Abnormal   Collection Time: 07/12/14  5:54 AM  Result Value Ref Range   WBC 7.3 4.0 - 10.5 K/uL   RBC 3.99 3.87 - 5.11 MIL/uL   Hemoglobin 12.3 12.0 - 15.0 g/dL   HCT 37.2 36.0 - 46.0 %   MCV 93.2 78.0 - 100.0 fL   MCH 30.8 26.0 - 34.0 pg   MCHC 33.1 30.0 - 36.0 g/dL   RDW 15.8 (H) 11.5 - 15.5 %   Platelets 87 (L) 150 - 400 K/uL    Comment: CONSISTENT WITH PREVIOUS RESULT  CK     Status: None   Collection Time: 07/12/14  5:54 AM  Result Value Ref Range   Total CK 144 7 - 177 U/L  Glucose, capillary     Status: Abnormal   Collection Time: 07/12/14  8:02 AM  Result Value Ref Range   Glucose-Capillary 147 (H) 70 - 99 mg/dL   Comment 1 Notify RN   Glucose, capillary     Status: Abnormal   Collection Time: 07/12/14 12:15 PM  Result Value Ref Range   Glucose-Capillary 172 (H) 70 - 99 mg/dL   Comment 1 Notify RN     MICRO: 12/17 urine = ecoli 100,000 although ua only 2 wbc IMAGING: Dg Chest Port 1 View  07/11/2014   CLINICAL DATA:  Shortness of breath for 1 day, history CHF, diabetes, GERD, hypertension  EXAM: PORTABLE CHEST - 1 VIEW  COMPARISON:  Portable exam 1737 hr compared to 05/18/2014 and 06/22/2014 fluoroscopic exam  FINDINGS: Tracheostomy tube again seen projecting over tracheal air column.  Enlargement of cardiac silhouette.  Mediastinal contours and pulmonary vascularity normal.  RIGHT arm PICC line with tip projecting over RIGHT subclavian vein, partially withdrawn since 06/22/2014  Mild RIGHT basilar  atelectasis.  No definite infiltrate, pleural effusion or pneumothorax.  IMPRESSION: Enlargement of cardiac silhouette with mild RIGHT basilar atelectasis.  RIGHT arm PICC line has been partially withdrawn since 06/22/2014, tip now projecting over RIGHT subclavian vein ; if positioning is desired within the SVC, recommend replacement.   Electronically Signed   By: Lavonia Dana [name]   On: 07/11/2014 18:00    Assessment/Plan: 54yo F with hx of mrsa and gbs osteomyelitis s/p surgical debridement on 12/11, placed on daptomycin since 12/26 presents with fevers, leukocytosis, possible dysuria - continue with ceftriaxone 2gm iv daily - will d/c picc line as possible cause of fevers, and leukocytosis - will d/c daptomycin, she has been on it for 25 days, but at 51m/kg. Can do oral drug to treat mrsa/gbs osteomyelitis. Unclear if we would need it that much longer. - she has drug intolerances rather than true allergy, so can tx nausea symptoms prior to giving doxycycline if we decide to continue to treat hand infection -  ? Possible ecoli = will give one more dose of ctx to complete 3 days of treatment.  CElzie RingsSGalesburgfor Infectious Diseases [phone]

## 2014-07-12 NOTE — Progress Notes (Addendum)
Pt does not wear CPAP.  Home regiment is vent QHS,  Inpatient regiment is Trach collar QHS, Room air with PMV the rest of the time.  RT to monitor Q4 and as needed.

## 2014-07-12 NOTE — Progress Notes (Signed)
Per IV team , they were unable to draw blood even after alteplase given. Jyl Heinz, PA notified via textpage.

## 2014-07-12 NOTE — Care Management Note (Signed)
    Page 1 of 1   07/15/2014     12:41:50 PM CARE MANAGEMENT NOTE 07/15/2014  Patient:  [name], [name]   Account Number:  [id number]  Date Initiated:  07/12/2014  Documentation initiated by:  Tomi Bamberger  Subjective/Objective Assessment:   dx sepsis  admit- lives alone.     Action/Plan:   Anticipated DC Date:  07/15/2014   Anticipated DC Plan:  [place]  CM consult      [facility] Choice  HOME HEALTH   Choice offered to / List presented to:  C-1 Patient        [place] arranged  [place] [facility]      [place].   Status of service:  Completed, signed off Medicare Important Message given?  YES (If response is "NO", the following Medicare IM given date fields will be blank) Date Medicare IM given:  07/14/2014 Medicare IM given by:  Tomi Bamberger Date Additional Medicare IM given:   Additional Medicare IM given by:    Discharge Disposition:  [place]  Per UR Regulation:  Reviewed for med. necessity/level of care/duration of stay  If discussed at [place] of Stay Meetings, dates discussed:    Comments:  07/15/14 [place], BSN [phone] patient is for dc today, she chose [facility] for hhpt, Miranda notified with [facility], Soc will begin 24-48 hrs post dc. Patient will need ambulance transport, CSW aware.  07/12/14 [place] ,BSN [phone] patient lives alone, NCM will cont to follow for dc needs.

## 2014-07-12 NOTE — Progress Notes (Signed)
Utilization review completed. Ariya Bohannon, RN, BSN. 

## 2014-07-12 NOTE — Progress Notes (Signed)
ANTICOAGULATION CONSULT NOTE - Follow Up Consult  Pharmacy Consult for Coumadin Indication: DVT history  Allergies  Allergen Reactions  . Codeine Other (See Comments)    Heart problems  . Molds & Smuts Shortness Of Breath and Rash  . Allopurinol Other (See Comments)    GI problems   . Ciprofloxacin Other (See Comments)    GI problems  . Doxycycline Other (See Comments)    Gastric problems  . Fexofenadine Other (See Comments)    Hurts joints/pain  . Keflex [Cephalexin] Diarrhea and Nausea Only  . Nitroglycerin Er Nausea And Vomiting  . Sulfa Drugs Cross Reactors Nausea And Vomiting  . Sulfonamide Derivatives Nausea Only  . Accolate [Zafirlukast] Other (See Comments)    Reaction unknown  . Amoxicillin-Pot Clavulanate Other (See Comments)    Other reaction(s): Unknown  . Morphine Other (See Comments)    unknown  . Septra [Sulfamethoxazole-Trimethoprim] Other (See Comments)    unknown    Patient Measurements: Height: 5\' 5"  (165.1 cm) Weight: (!) 434 lb 3.2 oz (196.952 kg) IBW/kg (Calculated) : 57  Vital Signs: Temp: 98.4 F (36.9 C) (01/18 0611) Temp Source: Oral (01/18 0611) BP: 127/56 mmHg (01/18 0611) Pulse Rate: 65 (01/18 0921)  Labs:  Recent Labs  07/11/14 1243 07/11/14 1415 07/11/14 2000 07/12/14 0554  HGB 13.4  --   --  12.3  HCT 40.9  --   --  37.2  PLT 93*  --   --  87*  LABPROT  --   --  27.5*  --   INR  --   --  2.54*  --   CREATININE  --  1.22*  --  1.00  CKTOTAL  --   --   --  144    Estimated Creatinine Clearance: 117.4 mL/min (by C-G formula based on Cr of 1).  Assessment: 54 year old female continues on Coumadin for a DVT history INR therapeutic on home regimen  Goal of Therapy:  INR 2-3 Monitor platelets by anticoagulation protocol: Yes   Plan:  Coumadin 5 mg po x 1 dose tonight at 1800 pm Daily INR  Thank you. Anette Guarneri, PharmD [phone]  07/12/2014,11:08 AM

## 2014-07-12 NOTE — Progress Notes (Signed)
Patient ID: AYLAH YEARY  female  [id number]:[id number]    DOB: September 04, 1960    DOA: 07/11/2014  PCP: [name], MD   Brief history of present illness  Patient is a 54 year old female with a DVT on Coumadin, morbid obesity, obesity hypoventilation syndrome, asthma, diastolic dysfunction, trach dependent, diabetes, hypertension, dyslipidemia who was being treated with Cubicin for MRSA osteomyelitis of right index finger by Dr Fredna Dow. She has history of multiple drug allergies, vancomycin Allergic and therefore PICC line was placed and she was started on Cubicin 2 weeks ago. Patient was supposed to see Dr. Tommy Medal from infectious disease on 1/20. However she developed low-grade fevers, mild dysuria and low back pain yesterday presented to ED where she had fever, leukocytosis, elevated lactic acid and sepsis with UA positive for nitrite, many bacteria and WBC only 0-2. Patient was admitted for sepsis workup.  Assessment/Plan: Principal Problem:   Sepsis/ SIRS: Source unclear, patient meets search criteria with fever, lactic acidosis, leukocytosis -  UA showed positive nitrites and many bacteria, placed on IV Rocephin, continue Cubicin  - Concern UTI versus PICC line infection  - ID consulted, will see patient, for now continue IV antibiotics, IV fluids and supportive care   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 Continue Cubicin  Hypertension  - BP currently stable, hold off on diuretics, for today, but DC'd IV fluids - Continue bystolic  Gout - Continue colchicine  Dyslipidemia - Continue statin  Anxiety: Continue  Xanax  History of DVT: Continue Coumadin    DVT Prophylaxis: on Coumadin   Code Status:  Family Communication:  Disposition:  Consultants: Infectious disease  Procedures : None  Antibiotics :  IV Cubicin    IV Rocephin 1/17   Subjective: Patient seen and examined, afebrile this morning, no specific complaints   Objective  Weight change:   Intake/Output Summary (Last 24 hours) at 07/12/14 1012 Last data filed at 07/12/14 0941  Gross per 24 hour  Intake   2450 ml  Output   1710 ml  Net    740 ml   Blood pressure 127/56, pulse 65, temperature 98.4 F (36.9 C), temperature source Oral, resp. rate 18, height 5\' 5"  (1.651 m), weight 196.952 kg (434 lb 3.2 oz), last menstrual period 05/17/2014, SpO2 100 %, unknown if currently breastfeeding.  Physical Exam: General: Alert and awake, oriented x3, not in any acute distress, + trach. CVS: S1-S2 clear, no murmur rubs or gallops Chest: clear to auscultation bilaterally, no wheezing, rales or rhonchi Abdomen: Morbidly obese soft nontender, nondistended, normal bowel sounds  Extremities: no cyanosis, clubbing, 2+ edema noted bilaterally Neuro: Cranial nerves II-XII intact, no focal neurological deficits  Lab Results: Basic Metabolic Panel:  Recent Labs Lab 07/11/14 1415 07/12/14 0554  NA 134* 133*  K 3.3* 3.1*  CL 96 101  CO2 25 24  GLUCOSE 164* 169*  BUN 19 15  CREATININE 1.22* 1.00  CALCIUM 9.0 8.2*   Liver Function Tests:  Recent Labs Lab 07/11/14 1415  AST 37  ALT 29  ALKPHOS 70  BILITOT 0.7  PROT 7.9  ALBUMIN 3.6   No results for input(s): LIPASE, AMYLASE in the last 168 hours.  No results for input(s): AMMONIA in the last 168 hours. CBC:  Recent Labs Lab 07/11/14 1243 07/12/14 0554  WBC 15.7* 7.3  NEUTROABS 13.1*  --   HGB 13.4 12.3  HCT 40.9 37.2  MCV 94.5 93.2  PLT 93* 87*   Cardiac Enzymes:  Recent Labs Lab 07/12/14 0554  CKTOTAL 144   BNP: Invalid input(s):  POCBNP CBG:  Recent Labs Lab 07/11/14 1834 07/11/14 2124 07/12/14 0802  GLUCAP 101* 153* 147*     Micro Results: Recent Results (from the past 240 hour(s))  Blood culture (routine x 2)     Status: None (Preliminary result)   Collection Time: 07/11/14  2:18 PM  Result Value Ref Range Status   Specimen Description BLOOD LEFT ARM  Final   Special Requests BOTTLES DRAWN AEROBIC AND ANAEROBIC 5.5CC  Final   Culture   Final           BLOOD CULTURE RECEIVED NO GROWTH TO DATE CULTURE WILL BE HELD FOR 5 DAYS BEFORE ISSUING A FINAL NEGATIVE REPORT Performed at [facility]    Report Status PENDING  Incomplete  Blood culture (routine x 2)     Status: None (Preliminary result)   Collection Time: 07/11/14  2:24 PM  Result Value Ref Range Status   Specimen Description BLOOD RIGHT WRIST  Final   Special Requests BOTTLES DRAWN AEROBIC AND ANAEROBIC 5CC  Final   Culture   Final           BLOOD CULTURE RECEIVED NO GROWTH TO DATE CULTURE WILL BE HELD FOR 5 DAYS BEFORE ISSUING A FINAL NEGATIVE REPORT Performed at [facility]    Report Status PENDING  Incomplete  Culture, respiratory (NON-Expectorated)     Status: None (Preliminary result)   Collection Time: 07/11/14  9:52 PM  Result Value Ref Range Status   Specimen Description TRACHEAL ASPIRATE  Final   Special Requests Normal  Final   Gram Stain PENDING  Incomplete   Culture NO GROWTH Performed at [facility]   Final   Report Status PENDING  Incomplete    Studies/Results: Ir Fluoro Guide Cv Line Right  06/22/2014   CLINICAL DATA:  54 year old female with a right hand cellulitis in the of durable venous access for outpatient IV antibiotic therapy.  EXAM: IR RIGHT FLOURO GUIDE CV LINE; IR ULTRASOUND GUIDANCE VASC ACCESS RIGHT  FLUOROSCOPY TIME:  24 seconds  TECHNIQUE: The right arm was prepped with chlorhexidine, draped in the usual sterile fashion using maximum barrier technique (cap and mask, sterile gown,  sterile gloves, large sterile sheet, hand hygiene and cutaneous antiseptic). Local anesthesia was attained by infiltration with 1% lidocaine.  Ultrasound demonstrated patency of the right basilic vein, and this was documented with an image. Under real-time ultrasound guidance, this vein was accessed with a 21 gauge micropuncture needle and image documentation was performed. The needle was exchanged over a guidewire for a peel-away sheath through which a 44 cm 5 [place] dual lumen power injectable PICC was advanced, and positioned with its tip at the lower SVC/right atrial junction. Fluoroscopy during the procedure and fluoro spot radiograph confirms appropriate catheter position. The catheter was flushed, secured to the skin with Prolene sutures, and covered with a sterile dressing.  COMPLICATIONS: None.  The patient tolerated the procedure well.  IMPRESSION: Successful placement of a right basilic vein approach dual lumen power injectable PICC with sonographic and fluoroscopic guidance. The catheter is ready for use.  Signed,  Criselda Peaches, MD  [facility]  [facility]   Electronically Signed   By: Jacqulynn Cadet [name]   On: 06/22/2014 17:34   Ir US Guide Vasc Access Right  06/22/2014   CLINICAL DATA:  53 year old female with a right hand cellulitis in the of durable venous access for outpatient IV antibiotic therapy.  EXAM: IR RIGHT FLOURO GUIDE CV LINE; IR ULTRASOUND GUIDANCE VASC ACCESS RIGHT  FLUOROSCOPY TIME:  24 seconds  TECHNIQUE: The right arm was prepped with chlorhexidine, draped in the usual sterile fashion using maximum barrier technique (cap and mask, sterile gown, sterile gloves, large sterile sheet, hand hygiene and cutaneous antiseptic). Local anesthesia was attained by infiltration with 1% lidocaine.  Ultrasound demonstrated patency of the right basilic vein, and this was documented with an image. Under real-time ultrasound guidance, this  vein was accessed with a 21 gauge micropuncture needle and image documentation was performed. The needle was exchanged over a guidewire for a peel-away sheath through which a 44 cm 5 [place] dual lumen power injectable PICC was advanced, and positioned with its tip at the lower SVC/right atrial junction. Fluoroscopy during the procedure and fluoro spot radiograph confirms appropriate catheter position. The catheter was flushed, secured to the skin with Prolene sutures, and covered with a sterile dressing.  COMPLICATIONS: None.  The patient tolerated the procedure well.  IMPRESSION: Successful placement of a right basilic vein approach dual lumen power injectable PICC with sonographic and fluoroscopic guidance. The catheter is ready for use.  Signed,  Criselda Peaches, MD  [facility]  [facility]   Electronically Signed   By: Jacqulynn Cadet [name]   On: 06/22/2014 17:34   Dg Chest Port 1 View  07/11/2014   CLINICAL DATA:  Shortness of breath for 1 day, history CHF, diabetes, GERD, hypertension  EXAM: PORTABLE CHEST - 1 VIEW  COMPARISON:  Portable exam 1737 hr compared to 05/18/2014 and 06/22/2014 fluoroscopic exam  FINDINGS: Tracheostomy tube again seen projecting over tracheal air column.  Enlargement of cardiac silhouette.  Mediastinal contours and pulmonary vascularity normal.  RIGHT arm PICC line with tip projecting over RIGHT subclavian vein, partially withdrawn since 06/22/2014  Mild RIGHT basilar atelectasis.  No definite infiltrate, pleural effusion or pneumothorax.  IMPRESSION: Enlargement of cardiac silhouette with mild RIGHT basilar atelectasis.  RIGHT arm PICC line has been partially withdrawn since 06/22/2014, tip now projecting over RIGHT subclavian vein ; if positioning is desired within the SVC, recommend replacement.   Electronically Signed   By: Lavonia Dana [name]   On: 07/11/2014 18:00    Medications: Scheduled Meds: . alteplase  1 mg  Intracatheter Once  . antiseptic oral rinse  7 mL Mouth Rinse q12n4p  . busPIRone  7.5 mg Oral BID  . canagliflozin  100 mg Oral Daily  . chlorhexidine  15 mL Mouth Rinse BID  . cholestyramine  1 packet Oral TID  . colchicine  0.6 mg Oral Daily  . DAPTOmycin (CUBICIN)  IV  800 mg Intravenous Q24H  . DULoxetine  30 mg Oral Daily  . fesoterodine  4 mg Oral Daily  . gabapentin  100 mg Oral QID  . insulin aspart  0-5 Units Subcutaneous QHS  . insulin aspart  0-9 Units Subcutaneous TID WC  . nebivolol  10 mg Oral Daily  . pantoprazole  40 mg Oral Daily  . primidone  50 mg Oral BID  . sodium chloride  3 mL Intravenous Q12H  . Warfarin - Pharmacist  Dosing Inpatient   Does not apply q1800      LOS: 1 day   RAI,RIPUDEEP [name] [facility] 07/12/2014, 10:12 AM Pager: [phone]  If 7PM-7AM, please contact night-coverage [email or web address] Password [email or web address]

## 2014-07-13 LAB — GLUCOSE, CAPILLARY
GLUCOSE-CAPILLARY: 189 mg/dL — AB (ref 70–99)
GLUCOSE-CAPILLARY: 228 mg/dL — AB (ref 70–99)
Glucose-Capillary: 176 mg/dL — ABNORMAL HIGH (ref 70–99)
Glucose-Capillary: 208 mg/dL — ABNORMAL HIGH (ref 70–99)

## 2014-07-13 LAB — URINE CULTURE

## 2014-07-13 LAB — PROTIME-INR
INR: 1.72 — ABNORMAL HIGH (ref 0.00–1.49)
PROTHROMBIN TIME: 20.3 s — AB (ref 11.6–15.2)

## 2014-07-13 MED ORDER — WARFARIN SODIUM 7.5 MG PO TABS
7.5000 mg | ORAL_TABLET | Freq: Once | ORAL | Status: AC
  Administered 2014-07-13: 7.5 mg via ORAL
  Filled 2014-07-13: qty 1

## 2014-07-13 MED ORDER — CHOLESTYRAMINE 4 G PO PACK
1.0000 | PACK | Freq: Three times a day (TID) | ORAL | Status: DC | PRN
  Filled 2014-07-13: qty 1

## 2014-07-13 MED ORDER — BUMETANIDE 2 MG PO TABS
2.0000 mg | ORAL_TABLET | Freq: Every day | ORAL | Status: DC
  Administered 2014-07-13 – 2014-07-14 (×2): 2 mg via ORAL
  Filled 2014-07-13 (×4): qty 1

## 2014-07-13 NOTE — Progress Notes (Signed)
Patient ID: [name]  female  QZR:[id number]    DOB: 12/25/1960    DOA: 07/11/2014  PCP: [name], MD   Brief history of present illness  Patient is a 54 year old female with a DVT on Coumadin, morbid obesity, obesity hypoventilation syndrome, asthma, diastolic dysfunction, trach dependent, diabetes, hypertension, dyslipidemia who was being treated with Cubicin for MRSA osteomyelitis of right index finger by Dr Fredna Dow. She has history of multiple drug allergies, vancomycin Allergic and therefore PICC line was placed and she was started on Cubicin 2 weeks ago. Patient was supposed to see Dr. Tommy Medal from infectious disease on 1/20. However she developed low-grade fevers, mild dysuria and low back pain yesterday presented to ED where she had fever, leukocytosis, elevated lactic acid and sepsis with UA positive for nitrite, many bacteria and WBC only 0-2. Patient was admitted for sepsis workup.  Assessment/Plan: Principal Problem:   Sepsis/ SIRS: Source unclear, patient meets search criteria with fever, lactic acidosis, leukocytosis -  UA showed positive nitrites and many bacteria, follow urine culture and activities - ID consulted, seen by Dr. Karolee Ohs who also concurs that patient likely has PICC line infection. PICC line was discontinued yesterday 1/18. - Dr. Graylon Good recommended continuing Rocephin 2 g IV daily, discontinued the daptomycin, patient had been on it for 25 days.  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 Continue IV Rocephin for now, Cubicin discontinued by ID  Hypertension  - BP currently stable,  restart Bumex  - Continue bystolic  Gout - Continue colchicine  Dyslipidemia - Continue statin  Anxiety: Continue Xanax  History of DVT: Continue Coumadin    DVT Prophylaxis: on Coumadin   Code Status:  Family Communication:  Disposition:  Consultants: Infectious disease  Procedures : None  Antibiotics :  IV Cubicin discontinued 1/18   IV Rocephin 1/17 >>  Subjective: Patient seen and examined, did not sleep well last night, does not like the bed otherwise no fevers or chills or any pain   Objective  Weight change: 16.602 kg (36 lb 9.6 oz)  Intake/Output Summary (Last 24 hours) at 07/13/14 1200 Last data filed at 07/13/14 0657  Gross per 24 hour  Intake    700 ml  Output   2200 ml  Net  -1500 ml   Blood pressure 128/69, pulse 62, temperature 97.7 F (36.5 C), temperature source Oral, resp. rate 18, height 5\' 5"  (1.651 m), weight 198.04 kg (436 lb 9.6 oz), last menstrual period 05/17/2014, SpO2 97 %, unknown if currently breastfeeding.  Physical Exam: General: Alert and awake, oriented x3, NAD, + trach. CVS: S1-S2 clear, no murmur rubs or gallops Chest: CTAB Abdomen: Morbidly obese soft NT, ND, NBS Extremities: no cyanosis, clubbing, 2+ edema noted bilaterally, lymphedema N  Lab Results: Basic Metabolic Panel:  Recent Labs Lab 07/11/14 1415 07/12/14 0554  NA 134* 133*  K 3.3* 3.1*  CL 96 101  CO2 25 24  GLUCOSE 164* 169*  BUN 19 15  CREATININE 1.22* 1.00  CALCIUM 9.0 8.2*   Liver Function Tests:  Recent Labs Lab 07/11/14 1415  AST 37  ALT 29  ALKPHOS 70  BILITOT 0.7  PROT 7.9  ALBUMIN  3.6   No results for input(s): LIPASE, AMYLASE in the last 168 hours. No results for input(s): AMMONIA in the last 168 hours. CBC:  Recent Labs Lab 07/11/14 1243 07/12/14 0554  WBC 15.7* 7.3  NEUTROABS 13.1*  --   HGB 13.4 12.3  HCT 40.9 37.2  MCV 94.5 93.2  PLT 93* 87*   Cardiac Enzymes:  Recent Labs Lab 07/12/14 0554  CKTOTAL 144    BNP: Invalid input(s): POCBNP CBG:  Recent Labs Lab 07/12/14 0802 07/12/14 1215 07/12/14 1712 07/12/14 2145 07/13/14 0805  GLUCAP 147* 172* 210* 212* 189*     Micro Results: Recent Results (from the past 240 hour(s))  Urine culture     Status: None (Preliminary result)   Collection Time: 07/11/14 12:56 PM  Result Value Ref Range Status   Specimen Description URINE, RANDOM  Final   Special Requests Immunocompromised  Final   Colony Count   Final    >=100,000 COLONIES/ML Performed at [facility]    Culture   Final    ESCHERICHIA COLI Performed at [facility]    Report Status PENDING  Incomplete  Blood culture (routine x 2)     Status: None (Preliminary result)   Collection Time: 07/11/14  2:18 PM  Result Value Ref Range Status   Specimen Description BLOOD LEFT ARM  Final   Special Requests BOTTLES DRAWN AEROBIC AND ANAEROBIC 5.5CC  Final   Culture   Final           BLOOD CULTURE RECEIVED NO GROWTH TO DATE CULTURE WILL BE HELD FOR 5 DAYS BEFORE ISSUING A FINAL NEGATIVE REPORT Performed at [facility]    Report Status PENDING  Incomplete  Blood culture (routine x 2)     Status: None (Preliminary result)   Collection Time: 07/11/14  2:24 PM  Result Value Ref Range Status   Specimen Description BLOOD RIGHT WRIST  Final   Special Requests BOTTLES DRAWN AEROBIC AND ANAEROBIC 5CC  Final   Culture   Final           BLOOD CULTURE RECEIVED NO GROWTH TO DATE CULTURE WILL BE HELD FOR 5 DAYS BEFORE ISSUING A FINAL NEGATIVE REPORT Performed at [facility]    Report Status PENDING  Incomplete  Culture, respiratory (NON-Expectorated)     Status: None (Preliminary result)   Collection Time: 07/11/14  9:52 PM  Result Value Ref Range Status   Specimen Description TRACHEAL ASPIRATE  Final   Special Requests Normal  Final   Gram Stain   Final    RARE WBC PRESENT,BOTH PMN AND MONONUCLEAR NO SQUAMOUS EPITHELIAL CELLS SEEN NO ORGANISMS  SEEN Performed at [facility]    Culture   Final    Culture reincubated for better growth Performed at [facility]    Report Status PENDING  Incomplete  MRSA PCR Screening     Status: Abnormal   Collection Time: 07/12/14  1:16 PM  Result Value Ref Range Status   MRSA by PCR POSITIVE (A) NEGATIVE Final    Comment:        The GeneXpert MRSA Assay (FDA approved for NASAL specimens only), is one component of a comprehensive MRSA colonization surveillance program. It is not intended to diagnose MRSA infection nor to guide or monitor treatment for MRSA infections. RESULT CALLED TO, READ BACK BY AND VERIFIED WITH: JAMA RN 16:25 07/12/14 ([email or web address])     Studies/Results: Ir Fluoro Guide Cv Line Right  06/22/2014  CLINICAL DATA:  54 year old female with a right hand cellulitis in the of durable venous access for outpatient IV antibiotic therapy.  EXAM: IR RIGHT FLOURO GUIDE CV LINE; IR ULTRASOUND GUIDANCE VASC ACCESS RIGHT  FLUOROSCOPY TIME:  24 seconds  TECHNIQUE: The right arm was prepped with chlorhexidine, draped in the usual sterile fashion using maximum barrier technique (cap and mask, sterile gown, sterile gloves, large sterile sheet, hand hygiene and cutaneous antiseptic). Local anesthesia was attained by infiltration with 1% lidocaine.  Ultrasound demonstrated patency of the right basilic vein, and this was documented with an image. Under real-time ultrasound guidance, this vein was accessed with a 21 gauge micropuncture needle and image documentation was performed. The needle was exchanged over a guidewire for a peel-away sheath through which a 44 cm 5 [place] dual lumen power injectable PICC was advanced, and positioned with its tip at the lower SVC/right atrial junction. Fluoroscopy during the procedure and fluoro spot radiograph confirms appropriate catheter position. The catheter was flushed, secured to the skin with Prolene sutures, and covered with a sterile  dressing.  COMPLICATIONS: None.  The patient tolerated the procedure well.  IMPRESSION: Successful placement of a right basilic vein approach dual lumen power injectable PICC with sonographic and fluoroscopic guidance. The catheter is ready for use.  Signed,  Criselda Peaches, MD  [facility]  [facility]   Electronically Signed   By: Jacqulynn Cadet [name]   On: 06/22/2014 17:34   Ir US Guide Vasc Access Right  06/22/2014   CLINICAL DATA:  54 year old female with a right hand cellulitis in the of durable venous access for outpatient IV antibiotic therapy.  EXAM: IR RIGHT FLOURO GUIDE CV LINE; IR ULTRASOUND GUIDANCE VASC ACCESS RIGHT  FLUOROSCOPY TIME:  24 seconds  TECHNIQUE: The right arm was prepped with chlorhexidine, draped in the usual sterile fashion using maximum barrier technique (cap and mask, sterile gown, sterile gloves, large sterile sheet, hand hygiene and cutaneous antiseptic). Local anesthesia was attained by infiltration with 1% lidocaine.  Ultrasound demonstrated patency of the right basilic vein, and this was documented with an image. Under real-time ultrasound guidance, this vein was accessed with a 21 gauge micropuncture needle and image documentation was performed. The needle was exchanged over a guidewire for a peel-away sheath through which a 44 cm 5 [place] dual lumen power injectable PICC was advanced, and positioned with its tip at the lower SVC/right atrial junction. Fluoroscopy during the procedure and fluoro spot radiograph confirms appropriate catheter position. The catheter was flushed, secured to the skin with Prolene sutures, and covered with a sterile dressing.  COMPLICATIONS: None.  The patient tolerated the procedure well.  IMPRESSION: Successful placement of a right basilic vein approach dual lumen power injectable PICC with sonographic and fluoroscopic guidance. The catheter is ready for use.  Signed,  Criselda Peaches, MD   [facility]  [facility]   Electronically Signed   By: Jacqulynn Cadet [name]   On: 06/22/2014 17:34   Dg Chest Port 1 View  07/11/2014   CLINICAL DATA:  Shortness of breath for 1 day, history CHF, diabetes, GERD, hypertension  EXAM: PORTABLE CHEST - 1 VIEW  COMPARISON:  Portable exam 1737 hr compared to 05/18/2014 and 06/22/2014 fluoroscopic exam  FINDINGS: Tracheostomy tube again seen projecting over tracheal air column.  Enlargement of cardiac silhouette.  Mediastinal contours and pulmonary vascularity normal.  RIGHT arm PICC line with tip projecting over RIGHT subclavian vein, partially  withdrawn since 06/22/2014  Mild RIGHT basilar atelectasis.  No definite infiltrate, pleural effusion or pneumothorax.  IMPRESSION: Enlargement of cardiac silhouette with mild RIGHT basilar atelectasis.  RIGHT arm PICC line has been partially withdrawn since 06/22/2014, tip now projecting over RIGHT subclavian vein ; if positioning is desired within the SVC, recommend replacement.   Electronically Signed   By: Lavonia Dana [name]   On: 07/11/2014 18:00    Medications: Scheduled Meds: . antiseptic oral rinse  7 mL Mouth Rinse q12n4p  . busPIRone  7.5 mg Oral BID  . canagliflozin  100 mg Oral Daily  . cefTRIAXone (ROCEPHIN)  IV  2 g Intravenous Q24H  . chlorhexidine  15 mL Mouth Rinse BID  . colchicine  0.6 mg Oral Daily  . DULoxetine  30 mg Oral Daily  . fesoterodine  4 mg Oral Daily  . gabapentin  100 mg Oral QID  . insulin aspart  0-5 Units Subcutaneous QHS  . insulin aspart  0-9 Units Subcutaneous TID WC  . nebivolol  10 mg Oral Daily  . pantoprazole  40 mg Oral Daily  . primidone  50 mg Oral BID  . sodium chloride  3 mL Intravenous Q12H  . warfarin  7.5 mg Oral ONCE-1800  . Warfarin - Pharmacist Dosing Inpatient   Does not apply q1800      LOS: 2 days   Verdean Murin [name] [facility] 07/13/2014, 12:00 PM Pager: [phone]  If 7PM-7AM, please  contact night-coverage [email or web address] Password [email or web address]

## 2014-07-13 NOTE — Progress Notes (Signed)
ANTICOAGULATION CONSULT NOTE - Follow Up Consult  Pharmacy Consult for Coumadin Indication: DVT history  Allergies  Allergen Reactions  . Codeine Other (See Comments)    Heart problems  . Molds & Smuts Shortness Of Breath and Rash  . Allopurinol Other (See Comments)    GI problems   . Ciprofloxacin Other (See Comments)    GI problems  . Doxycycline Other (See Comments)    Gastric problems  . Fexofenadine Other (See Comments)    Hurts joints/pain  . Keflex [Cephalexin] Diarrhea and Nausea Only  . Nitroglycerin Er Nausea And Vomiting  . Sulfa Drugs Cross Reactors Nausea And Vomiting  . Sulfonamide Derivatives Nausea Only  . Accolate [Zafirlukast] Other (See Comments)    Reaction unknown  . Amoxicillin-Pot Clavulanate Other (See Comments)    Other reaction(s): Unknown  . Morphine Other (See Comments)    unknown  . Septra [Sulfamethoxazole-Trimethoprim] Other (See Comments)    unknown    Patient Measurements: Height: 5\' 5"  (165.1 cm) Weight: (!) 436 lb 9.6 oz (198.04 kg) IBW/kg (Calculated) : 57  Vital Signs: Temp: 97.7 F (36.5 C) (01/19 0650) Temp Source: Oral (01/19 0650) BP: 128/69 mmHg (01/19 0650) Pulse Rate: 62 (01/19 0849)  Labs:  Recent Labs  07/11/14 1243 07/11/14 1415 07/11/14 2000 07/12/14 0554 07/13/14 0547  HGB 13.4  --   --  12.3  --   HCT 40.9  --   --  37.2  --   PLT 93*  --   --  87*  --   LABPROT  --   --  27.5*  --  20.3*  INR  --   --  2.54*  --  1.72*  CREATININE  --  1.22*  --  1.00  --   CKTOTAL  --   --   --  144  --     Estimated Creatinine Clearance: 117.8 mL/min (by C-G formula based on Cr of 1).  Assessment: 54 year old female continues on Coumadin for a DVT history INR decreased today to 1.72  Goal of Therapy:  INR 2-3 Monitor platelets by anticoagulation protocol: Yes   Plan:  Coumadin 7.5 mg po x 1 dose tonight at 1800 pm Daily INR  Thank you. Anette Guarneri, PharmD [phone]  07/13/2014,10:40 AM

## 2014-07-14 ENCOUNTER — Ambulatory Visit: Payer: Medicaid Other | Admitting: Infectious Diseases

## 2014-07-14 DIAGNOSIS — R197 Diarrhea, unspecified: Secondary | ICD-10-CM

## 2014-07-14 DIAGNOSIS — B9620 Unspecified Escherichia coli [E. coli] as the cause of diseases classified elsewhere: Secondary | ICD-10-CM | POA: Insufficient documentation

## 2014-07-14 DIAGNOSIS — N390 Urinary tract infection, site not specified: Secondary | ICD-10-CM

## 2014-07-14 DIAGNOSIS — B9561 Methicillin susceptible Staphylococcus aureus infection as the cause of diseases classified elsewhere: Secondary | ICD-10-CM

## 2014-07-14 DIAGNOSIS — R300 Dysuria: Secondary | ICD-10-CM

## 2014-07-14 DIAGNOSIS — R6510 Systemic inflammatory response syndrome (SIRS) of non-infectious origin without acute organ dysfunction: Secondary | ICD-10-CM

## 2014-07-14 LAB — PROTIME-INR
INR: 2.11 — ABNORMAL HIGH (ref 0.00–1.49)
Prothrombin Time: 23.8 seconds — ABNORMAL HIGH (ref 11.6–15.2)

## 2014-07-14 LAB — GLUCOSE, CAPILLARY
GLUCOSE-CAPILLARY: 207 mg/dL — AB (ref 70–99)
GLUCOSE-CAPILLARY: 178 mg/dL — AB (ref 70–99)
Glucose-Capillary: 167 mg/dL — ABNORMAL HIGH (ref 70–99)
Glucose-Capillary: 218 mg/dL — ABNORMAL HIGH (ref 70–99)

## 2014-07-14 LAB — BASIC METABOLIC PANEL
ANION GAP: 11 (ref 5–15)
BUN: 16 mg/dL (ref 6–23)
CALCIUM: 8.6 mg/dL (ref 8.4–10.5)
CO2: 30 mmol/L (ref 19–32)
CREATININE: 0.86 mg/dL (ref 0.50–1.10)
Chloride: 97 mEq/L (ref 96–112)
GFR, EST AFRICAN AMERICAN: 88 mL/min — AB (ref >90)
GFR, EST NON AFRICAN AMERICAN: 76 mL/min — AB (ref >90)
Glucose, Bld: 193 mg/dL — ABNORMAL HIGH (ref 70–99)
Potassium: 3.1 mmol/L — ABNORMAL LOW (ref 3.5–5.1)
SODIUM: 138 mmol/L (ref 135–145)

## 2014-07-14 LAB — CBC
HEMATOCRIT: 39.4 % (ref 36.0–46.0)
HEMOGLOBIN: 13 g/dL (ref 12.0–15.0)
MCH: 30.4 pg (ref 26.0–34.0)
MCHC: 33 g/dL (ref 30.0–36.0)
MCV: 92.3 fL (ref 78.0–100.0)
Platelets: 96 10*3/uL — ABNORMAL LOW (ref 150–400)
RBC: 4.27 MIL/uL (ref 3.87–5.11)
RDW: 15.1 % (ref 11.5–15.5)
WBC: 5.5 10*3/uL (ref 4.0–10.5)

## 2014-07-14 LAB — CLOSTRIDIUM DIFFICILE BY PCR: Toxigenic C. Difficile by PCR: NEGATIVE

## 2014-07-14 MED ORDER — NITROFURANTOIN MONOHYD MACRO 100 MG PO CAPS
100.0000 mg | ORAL_CAPSULE | Freq: Two times a day (BID) | ORAL | Status: DC
  Administered 2014-07-15: 100 mg via ORAL
  Filled 2014-07-14 (×2): qty 1

## 2014-07-14 MED ORDER — WARFARIN SODIUM 5 MG PO TABS
5.0000 mg | ORAL_TABLET | Freq: Once | ORAL | Status: AC
  Administered 2014-07-14: 5 mg via ORAL
  Filled 2014-07-14: qty 1

## 2014-07-14 MED ORDER — GUAIFENESIN ER 600 MG PO TB12
600.0000 mg | ORAL_TABLET | Freq: Two times a day (BID) | ORAL | Status: DC | PRN
  Filled 2014-07-14: qty 1

## 2014-07-14 MED ORDER — POTASSIUM CHLORIDE CRYS ER 20 MEQ PO TBCR
40.0000 meq | EXTENDED_RELEASE_TABLET | Freq: Once | ORAL | Status: AC
  Administered 2014-07-14: 40 meq via ORAL
  Filled 2014-07-14: qty 2

## 2014-07-14 NOTE — Progress Notes (Signed)
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    Patient was supposed to see Dr. Tommy Medal from infectious disease on 1/20. However she developed low-grade fevers, mild dysuria and low back pain yesterday presented to ED where she had fever, leukocytosis, elevated lactic acid and sepsis with UA positive for nitrite, many bacteria, and WBC only 0-2. Patient was admitted for sepsis workup.  Assessment/Plan:  Sepsis/ SIRS -Presented with fever, leukocytosis, and lactic acidosis. Source unclear, pt with dysuria -UA- pos nitrites and many bacteria, follow urine culture and activities -Per ID-continue Rocephin 2 g IV daily; d'c PICC as possible source of sepsis; d/c daptomycin.  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 Staph - influenza panel negativ  Hypokalemia -replace with KDUR 40mg  once -repeat BMET in am  Diabetes mellitus type 2 in obese -Hgb A1c 7.2 -Continue sliding scale, continue invokana  Hypertension  -BP currently stable, restart Bumex, cont bystolic    Diarrhea -resolved, no further episodes -Cdiff  negative -Questran PRN  Gout -no complaints -Continue colchicine  Dyslipidemia -Continue statin  Anxiety -stable -Continue Xanax  History of DVT -INR  2.11 -on Coumadin per pharmacy  Morbid Obesity -BMI 66.7  Urgency/incotinency -cont Toviaz daily  GERD Cont Protonix daily  Depression -No SI -continue Cymbaltal  Peripheral neuropathy Cont gabapentin  DVT Prophylaxis: on Coumadin   Code Status:  Full  Family Communication: no family at bedside  Disposition:  Inpatient  Consultants: Infectious disease  Procedures : None  Antibiotics :  IV Cubicin discontinued 1/18   IV Rocephin 1/17 >>  Subjective: Denies any complaints. Sitting in bed.   Objective  Weight change: -5.851 kg (-12 lb 14.4 oz)  Intake/Output Summary (Last 24 hours) at 07/14/14 1226 Last data filed at 07/14/14 1223  Gross per 24 hour  Intake   1680 ml  Output   5925 ml  Net  -4245 ml   Blood pressure 142/67, pulse 60, temperature 97.7 F (36.5 C), temperature source Oral, resp. rate 17, height 5\' 5"  (1.651 m), weight 192.189 kg (423 lb 11.2 oz), last menstrual period 05/17/2014, SpO2 100 %, unknown if currently breastfeeding.  Physical Exam: General: Alert and awake, oriented x3, NAD, with trach  CVS: S1-S2 clear, no murmur rubs or gallops Chest: CTAB Abdomen: Morbidly obese , soft, ntnd, + BS Extremities: no cyanosis, clubbing, 2+ edema noted bilaterally   Lab Results: Basic Metabolic Panel:  Recent Labs Lab 07/12/14 0554 07/14/14 0544  NA 133* 138  K 3.1* 3.1*  CL 101 97  CO2 24 30  GLUCOSE 169* 193*  BUN 15 16  CREATININE 1.00 0.86  CALCIUM 8.2* 8.6   Liver Function Tests:  Recent Labs Lab  07/11/14 1415  AST 37  ALT 29  ALKPHOS 70  BILITOT 0.7  PROT 7.9  ALBUMIN 3.6   No results for input(s): LIPASE, AMYLASE in the last 168 hours. No results for input(s): AMMONIA in the last 168 hours. CBC:  Recent Labs Lab 07/11/14 1243 07/12/14 0554  07/14/14 0544  WBC 15.7* 7.3 5.5  NEUTROABS 13.1*  --   --   HGB 13.4 12.3 13.0  HCT 40.9 37.2 39.4  MCV 94.5 93.2 92.3  PLT 93* 87* 96*   Cardiac Enzymes:  Recent Labs Lab 07/12/14 0554  CKTOTAL 144   BNP: Invalid input(s): POCBNP CBG:  Recent Labs Lab 07/13/14 0805 07/13/14 1231 07/13/14 1658 07/13/14 2211 07/14/14 0814  GLUCAP 189* 176* 228* 208* 167*     Micro Results: Recent Results (from the past 240 hour(s))  Urine culture     Status: None   Collection Time: 07/11/14 12:56 PM  Result Value Ref Range Status   Specimen Description URINE, RANDOM  Final   Special Requests Immunocompromised  Final   Colony Count   Final    >=100,000 COLONIES/ML Performed at [facility]    Culture   Final    ESCHERICHIA COLI Performed at [facility]    Report Status 07/13/2014 FINAL  Final   Organism ID, Bacteria ESCHERICHIA COLI  Final      Susceptibility   Escherichia coli - MIC*    AMPICILLIN RESISTANT      CEFAZOLIN <=4 SENSITIVE Sensitive     CEFTRIAXONE <=1 SENSITIVE Sensitive     CIPROFLOXACIN >=4 RESISTANT Resistant     GENTAMICIN <=1 SENSITIVE Sensitive     LEVOFLOXACIN >=8 RESISTANT Resistant     NITROFURANTOIN <=16 SENSITIVE Sensitive     TOBRAMYCIN <=1 SENSITIVE Sensitive     TRIMETH/SULFA >=320 RESISTANT Resistant     PIP/TAZO >=128 RESISTANT Resistant     * ESCHERICHIA COLI  Blood culture (routine x 2)     Status: None (Preliminary result)   Collection Time: 07/11/14  2:18 PM  Result Value Ref Range Status   Specimen Description BLOOD LEFT ARM  Final   Special Requests BOTTLES DRAWN AEROBIC AND ANAEROBIC 5.5CC  Final   Culture   Final           BLOOD CULTURE RECEIVED NO GROWTH TO DATE CULTURE WILL BE HELD FOR 5 DAYS BEFORE ISSUING A FINAL NEGATIVE REPORT Performed at [facility]    Report Status PENDING  Incomplete  Blood culture (routine x 2)     Status: None (Preliminary result)   Collection Time: 07/11/14  2:24 PM   Result Value Ref Range Status   Specimen Description BLOOD RIGHT WRIST  Final   Special Requests BOTTLES DRAWN AEROBIC AND ANAEROBIC 5CC  Final   Culture   Final           BLOOD CULTURE RECEIVED NO GROWTH TO DATE CULTURE WILL BE HELD FOR 5 DAYS BEFORE ISSUING A FINAL NEGATIVE REPORT Performed at [facility]    Report Status PENDING  Incomplete  Culture, respiratory (NON-Expectorated)     Status: None (Preliminary result)   Collection Time: 07/11/14  9:52 PM  Result Value Ref Range Status   Specimen Description TRACHEAL ASPIRATE  Final   Special Requests Normal  Final   Gram Stain   Final    RARE WBC PRESENT,BOTH PMN AND MONONUCLEAR NO SQUAMOUS EPITHELIAL CELLS SEEN NO ORGANISMS SEEN Performed at [facility]  Final    MODERATE STAPHYLOCOCCUS AUREUS Note: RIFAMPIN AND GENTAMICIN SHOULD NOT BE USED AS SINGLE DRUGS FOR TREATMENT OF STAPH INFECTIONS. GRAM NEGATIVE RODS Performed at [facility]    Report Status PENDING  Incomplete  MRSA PCR Screening     Status: Abnormal   Collection Time: 07/12/14  1:16 PM  Result Value Ref Range Status   MRSA by PCR POSITIVE (A) NEGATIVE Final    Comment:        The GeneXpert MRSA Assay (FDA approved for NASAL specimens only), is one component of a comprehensive MRSA colonization surveillance program. It is not intended to diagnose MRSA infection nor to guide or monitor treatment for MRSA infections. RESULT CALLED TO, READ BACK BY AND VERIFIED WITH: JAMA RN 16:25 07/12/14 ([email or web address])   Clostridium Difficile by PCR     Status: None   Collection Time: 07/14/14 10:37 AM  Result Value Ref Range Status   C difficile by pcr NEGATIVE NEGATIVE Final    Studies/Results: Ir Fluoro Guide Cv Line Right  06/22/2014   CLINICAL DATA:  54 year old female with a right hand cellulitis in the of durable venous access for outpatient IV antibiotic therapy.  EXAM: IR RIGHT FLOURO GUIDE CV LINE; IR ULTRASOUND GUIDANCE  VASC ACCESS RIGHT  FLUOROSCOPY TIME:  24 seconds  TECHNIQUE: The right arm was prepped with chlorhexidine, draped in the usual sterile fashion using maximum barrier technique (cap and mask, sterile gown, sterile gloves, large sterile sheet, hand hygiene and cutaneous antiseptic). Local anesthesia was attained by infiltration with 1% lidocaine.  Ultrasound demonstrated patency of the right basilic vein, and this was documented with an image. Under real-time ultrasound guidance, this vein was accessed with a 21 gauge micropuncture needle and image documentation was performed. The needle was exchanged over a guidewire for a peel-away sheath through which a 44 cm 5 [place] dual lumen power injectable PICC was advanced, and positioned with its tip at the lower SVC/right atrial junction. Fluoroscopy during the procedure and fluoro spot radiograph confirms appropriate catheter position. The catheter was flushed, secured to the skin with Prolene sutures, and covered with a sterile dressing.  COMPLICATIONS: None.  The patient tolerated the procedure well.  IMPRESSION: Successful placement of a right basilic vein approach dual lumen power injectable PICC with sonographic and fluoroscopic guidance. The catheter is ready for use.  Signed,  Criselda Peaches, MD  [facility]  [facility]   Electronically Signed   By: Jacqulynn Cadet [name]   On: 06/22/2014 17:34   Ir US Guide Vasc Access Right  06/22/2014   CLINICAL DATA:  54 year old female with a right hand cellulitis in the of durable venous access for outpatient IV antibiotic therapy.  EXAM: IR RIGHT FLOURO GUIDE CV LINE; IR ULTRASOUND GUIDANCE VASC ACCESS RIGHT  FLUOROSCOPY TIME:  24 seconds  TECHNIQUE: The right arm was prepped with chlorhexidine, draped in the usual sterile fashion using maximum barrier technique (cap and mask, sterile gown, sterile gloves, large sterile sheet, hand hygiene and cutaneous antiseptic).  Local anesthesia was attained by infiltration with 1% lidocaine.  Ultrasound demonstrated patency of the right basilic vein, and this was documented with an image. Under real-time ultrasound guidance, this vein was accessed with a 21 gauge micropuncture needle and image documentation was performed. The needle was exchanged over a guidewire for a peel-away sheath through which a 44 cm 5 [place] dual lumen power injectable PICC was advanced, and positioned with its tip at the lower  SVC/right atrial junction. Fluoroscopy during the procedure and fluoro spot radiograph confirms appropriate catheter position. The catheter was flushed, secured to the skin with Prolene sutures, and covered with a sterile dressing.  COMPLICATIONS: None.  The patient tolerated the procedure well.  IMPRESSION: Successful placement of a right basilic vein approach dual lumen power injectable PICC with sonographic and fluoroscopic guidance. The catheter is ready for use.  Signed,  Criselda Peaches, MD  [facility]  [facility]   Electronically Signed   By: Jacqulynn Cadet [name]   On: 06/22/2014 17:34   Dg Chest Port 1 View  07/11/2014   CLINICAL DATA:  Shortness of breath for 1 day, history CHF, diabetes, GERD, hypertension  EXAM: PORTABLE CHEST - 1 VIEW  COMPARISON:  Portable exam 1737 hr compared to 05/18/2014 and 06/22/2014 fluoroscopic exam  FINDINGS: Tracheostomy tube again seen projecting over tracheal air column.  Enlargement of cardiac silhouette.  Mediastinal contours and pulmonary vascularity normal.  RIGHT arm PICC line with tip projecting over RIGHT subclavian vein, partially withdrawn since 06/22/2014  Mild RIGHT basilar atelectasis.  No definite infiltrate, pleural effusion or pneumothorax.  IMPRESSION: Enlargement of cardiac silhouette with mild RIGHT basilar atelectasis.  RIGHT arm PICC line has been partially withdrawn since 06/22/2014, tip now projecting over RIGHT  subclavian vein ; if positioning is desired within the SVC, recommend replacement.   Electronically Signed   By: Lavonia Dana [name]   On: 07/11/2014 18:00    Medications: Scheduled Meds: . antiseptic oral rinse  7 mL Mouth Rinse q12n4p  . bumetanide  2 mg Oral Daily  . busPIRone  7.5 mg Oral BID  . canagliflozin  100 mg Oral Daily  . cefTRIAXone (ROCEPHIN)  IV  2 g Intravenous Q24H  . chlorhexidine  15 mL Mouth Rinse BID  . colchicine  0.6 mg Oral Daily  . DULoxetine  30 mg Oral Daily  . fesoterodine  4 mg Oral Daily  . gabapentin  100 mg Oral QID  . insulin aspart  0-5 Units Subcutaneous QHS  . insulin aspart  0-9 Units Subcutaneous TID WC  . nebivolol  10 mg Oral Daily  . pantoprazole  40 mg Oral Daily  . primidone  50 mg Oral BID  . sodium chloride  3 mL Intravenous Q12H  . warfarin  5 mg Oral ONCE-1800  . Warfarin - Pharmacist Dosing Inpatient   Does not apply q1800      LOS: 3 days   Lacy Duverney [name] [facility] 07/14/2014, 12:26 PM Pager: [phone]  If 7PM-7AM, please contact night-coverage [email or web address] Password [email or web address]

## 2014-07-14 NOTE — Progress Notes (Signed)
ANTICOAGULATION CONSULT NOTE - Follow Up Consult  Pharmacy Consult for Coumadin Indication: DVT history  Allergies  Allergen Reactions  . Codeine Other (See Comments)    Heart problems  . Molds & Smuts Shortness Of Breath and Rash  . Allopurinol Other (See Comments)    GI problems   . Ciprofloxacin Other (See Comments)    GI problems  . Doxycycline Other (See Comments)    Gastric problems  . Fexofenadine Other (See Comments)    Hurts joints/pain  . Keflex [Cephalexin] Diarrhea and Nausea Only  . Nitroglycerin Er Nausea And Vomiting  . Sulfa Drugs Cross Reactors Nausea And Vomiting  . Sulfonamide Derivatives Nausea Only  . Accolate [Zafirlukast] Other (See Comments)    Reaction unknown  . Amoxicillin-Pot Clavulanate Other (See Comments)    Other reaction(s): Unknown  . Morphine Other (See Comments)    unknown  . Septra [Sulfamethoxazole-Trimethoprim] Other (See Comments)    unknown    Patient Measurements: Height: 5\' 5"  (165.1 cm) Weight: (!) 423 lb 11.2 oz (192.189 kg) IBW/kg (Calculated) : 57  Vital Signs: BP: 142/67 mmHg (01/20 0947) Pulse Rate: 60 (01/20 0947)  Labs:  Recent Labs  07/11/14 1243 07/11/14 1415 07/11/14 2000 07/12/14 0554 07/13/14 0547 07/14/14 0544  HGB 13.4  --   --  12.3  --  13.0  HCT 40.9  --   --  37.2  --  39.4  PLT 93*  --   --  87*  --  96*  LABPROT  --   --  27.5*  --  20.3* 23.8*  INR  --   --  2.54*  --  1.72* 2.11*  CREATININE  --  1.22*  --  1.00  --  0.86  CKTOTAL  --   --   --  144  --   --     Estimated Creatinine Clearance: 134.2 mL/min (by C-G formula based on Cr of 0.86).  Assessment: 54 year old female continues on Coumadin for a DVT history INR 2.11  Goal of Therapy:  INR 2-3 Monitor platelets by anticoagulation protocol: Yes   Plan:  Coumadin 5 mg po x 1 dose tonight at 1800 pm Daily INR  Thank you. Anette Guarneri, PharmD [phone]  07/14/2014,10:53 AM

## 2014-07-14 NOTE — Progress Notes (Signed)
Inpatient Diabetes Program Recommendations  AACE/ADA: New Consensus Statement on Inpatient Glycemic Control (2013)  Target Ranges:  Prepandial:   less than 140 mg/dL      Peak postprandial:   less than 180 mg/dL (1-2 hours)      Critically ill patients:  140 - 180 mg/dL   Reason for Visit: Elevated post-prandial blood sugars  Results for [name], [name] (MRN [id number]) as of 07/14/2014 13:13  Ref. Range 07/13/2014 12:31 07/13/2014 16:58 07/13/2014 22:11 07/14/2014 08:14 07/14/2014 12:16  Glucose-Capillary Latest Range: 70-99 mg/dL 176 (H) 228 (H) 208 (H) 167 (H) 207 (H)   Post-prandial blood sugars elevated. May benefit from meal coverage insulin.  Consider addition of Novolog 3 units tidwc for meal coverage insulin if pt eats >50% meal.  Will continue to follow. Thank you. Lorenda Peck, RD, LDN, CDE Inpatient Diabetes Coordinator [phone]

## 2014-07-14 NOTE — Progress Notes (Signed)
[place] for Infectious Disease    Date of Admission:  07/11/2014   Total days of antibiotics 4        Day 4 ceftriaxone           ID: [name] is a 54 y.o. female with  SIRS, dysuria likely from urinary source Principal Problem:   Sepsis Active Problems:   Morbid obesity   Diabetes mellitus type 2 in obese   GERD (gastroesophageal reflux disease)   Tracheostomy dependence   Current use of long term anticoagulation   Chronic diastolic heart failure   Chronic osteomyelitis   Fever    Subjective: Afebrile, feeling better, did pinch inner thigh on bedside commode that is improving she reports  Leukocytosis resolved  Medications:  . antiseptic oral rinse  7 mL Mouth Rinse q12n4p  . bumetanide  2 mg Oral Daily  . busPIRone  7.5 mg Oral BID  . canagliflozin  100 mg Oral Daily  . cefTRIAXone (ROCEPHIN)  IV  2 g Intravenous Q24H  . chlorhexidine  15 mL Mouth Rinse BID  . colchicine  0.6 mg Oral Daily  . DULoxetine  30 mg Oral Daily  . fesoterodine  4 mg Oral Daily  . gabapentin  100 mg Oral QID  . insulin aspart  0-5 Units Subcutaneous QHS  . insulin aspart  0-9 Units Subcutaneous TID WC  . nebivolol  10 mg Oral Daily  . pantoprazole  40 mg Oral Daily  . primidone  50 mg Oral BID  . sodium chloride  3 mL Intravenous Q12H  . Warfarin - Pharmacist Dosing Inpatient   Does not apply q1800    Objective: Vital signs in last 24 hours: Temp:  [97.5 F (36.4 C)-97.7 F (36.5 C)] 97.7 F (36.5 C) (01/19 2228) Pulse Rate:  [55-72] 60 (01/20 0947) Resp:  [16-18] 17 (01/20 0740) BP: (136-165)/(62-79) 142/67 mmHg (01/20 0947) SpO2:  [98 %-100 %] 100 % (01/20 0740) FiO2 (%):  [21 %-28 %] 28 % (01/20 0740) Weight:  [423 lb 11.2 oz (192.189 kg)] 423 lb 11.2 oz (192.189 kg) (01/20 0500)  Physical Exam  Constitutional:  oriented to person, place, and time. appears well-developed and well-nourished. No distress.  HENT: trach in place Mouth/Throat: Oropharynx is clear  and moist. No oropharyngeal exudate.  Cardiovascular: Normal rate, regular rhythm and normal heart sounds. Exam reveals no gallop and no friction rub.  No murmur heard.  Pulmonary/Chest: Effort normal and breath sounds normal. No respiratory distress.  has no wheezes.  Abdominal: Soft. Bowel sounds are normal.  exhibits no distension. There is no tenderness.  Lymphadenopathy: no cervical adenopathy.  Neurological: alert and oriented to person, place, and time.  Skin: Skin is warm and dry. No rash noted. No erythema.  Psychiatric: a normal mood and affect. behavior is normal.   Lab Results  Recent Labs  07/12/14 0554 07/14/14 0544  WBC 7.3 5.5  HGB 12.3 13.0  HCT 37.2 39.4  NA 133* 138  K 3.1* 3.1*  CL 101 97  CO2 24 30  BUN 15 16  CREATININE 1.00 0.86   Liver Panel  Recent Labs  07/11/14 1415  PROT 7.9  ALBUMIN 3.6  AST 37  ALT 29  ALKPHOS 70  BILITOT 0.7    Microbiology: 1/17 trach = mssa 1/17 urine cx = ecoli 1/17 blood cx = ngtd   Assessment/Plan: ecoli uti = would change over to nitrofurantoin for 4 additional days and discontinue ceftriaxone.  Loose  stools/diarrhea= resolved, does not appear to be perfuse concerning for cdiff  mssa in trach aspirate = no pulmonary symptoms or cxr changes to be concern for pneumonia  Will sign off  Baxter Flattery [facility] for Infectious Diseases Cell: [phone] Pager: [phone]  07/14/2014, 10:17 AM

## 2014-07-15 ENCOUNTER — Ambulatory Visit: Payer: Medicaid Other | Admitting: Infectious Disease

## 2014-07-15 DIAGNOSIS — N390 Urinary tract infection, site not specified: Secondary | ICD-10-CM

## 2014-07-15 DIAGNOSIS — B9620 Unspecified Escherichia coli [E. coli] as the cause of diseases classified elsewhere: Secondary | ICD-10-CM

## 2014-07-15 LAB — BASIC METABOLIC PANEL
Anion gap: 12 (ref 5–15)
BUN: 16 mg/dL (ref 6–23)
CO2: 30 mmol/L (ref 19–32)
Calcium: 8.3 mg/dL — ABNORMAL LOW (ref 8.4–10.5)
Chloride: 96 mEq/L (ref 96–112)
Creatinine, Ser: 1.01 mg/dL (ref 0.50–1.10)
GFR calc Af Amer: 73 mL/min — ABNORMAL LOW (ref >90)
GFR calc non Af Amer: 63 mL/min — ABNORMAL LOW (ref >90)
Glucose, Bld: 190 mg/dL — ABNORMAL HIGH (ref 70–99)
Potassium: 3.7 mmol/L (ref 3.5–5.1)
SODIUM: 138 mmol/L (ref 135–145)

## 2014-07-15 LAB — CBC
HEMATOCRIT: 37.6 % (ref 36.0–46.0)
Hemoglobin: 12.8 g/dL (ref 12.0–15.0)
MCH: 31.1 pg (ref 26.0–34.0)
MCHC: 34 g/dL (ref 30.0–36.0)
MCV: 91.3 fL (ref 78.0–100.0)
Platelets: 96 10*3/uL — ABNORMAL LOW (ref 150–400)
RBC: 4.12 MIL/uL (ref 3.87–5.11)
RDW: 14.9 % (ref 11.5–15.5)
WBC: 5.6 10*3/uL (ref 4.0–10.5)

## 2014-07-15 LAB — GLUCOSE, CAPILLARY
Glucose-Capillary: 148 mg/dL — ABNORMAL HIGH (ref 70–99)
Glucose-Capillary: 199 mg/dL — ABNORMAL HIGH (ref 70–99)

## 2014-07-15 LAB — PROTIME-INR
INR: 2.58 — ABNORMAL HIGH (ref 0.00–1.49)
Prothrombin Time: 27.9 seconds — ABNORMAL HIGH (ref 11.6–15.2)

## 2014-07-15 MED ORDER — NITROFURANTOIN MONOHYD MACRO 100 MG PO CAPS: 100.0000 mg | ORAL_CAPSULE | Freq: Two times a day (BID) | ORAL | Status: DC

## 2014-07-15 MED ORDER — NITROFURANTOIN MONOHYD MACRO 100 MG PO CAPS: 100.0000 mg | ORAL_CAPSULE | Freq: Two times a day (BID) | ORAL | Status: AC

## 2014-07-15 NOTE — Discharge Summary (Signed)
Physician Discharge Summary  [name] TOI:[id number] DOB: 07/03/1960 DOA: 07/11/2014  PCP: [name], MD  Admit date: 07/11/2014 Discharge date: 07/15/2014  Time spent: 45 minutes  Recommendations for Outpatient Follow-up:  1. Follow up PCP for INR recheck, and referral for 2D echocardiogram for further assessment of Congestive Heart Failure  2. Discharge home with home health PT 3. (include homehealth, outpatient follow-up instructions, specific recommendations for PCP to follow-up on, etc.)  Discharge Diagnoses:  Principal Problem:   Sepsis Active Problems:   Morbid obesity   Diabetes mellitus type 2 in obese   GERD (gastroesophageal reflux disease)   Tracheostomy dependence   Current use of long term anticoagulation   Chronic diastolic heart failure   Chronic osteomyelitis   Fever   E-coli UTI   Discharge Condition: Stable  Diet recommendation: Carb modified/heart Health  Filed Weights   07/13/14 0650 07/14/14 0500 07/15/14 0639  Weight: 198.04 kg (436 lb 9.6 oz) 192.189 kg (423 lb 11.2 oz) 190.511 kg (420 lb)    History of present illness:  [name] is a 54 year old female with past medical history of DVT on Coumadin, morbid obesity, obesity hypoventilation syndrome, trach dependent, asthma, diastolic heart failure, type 2 diabetes mellitus, hypertension, dyslipidemia, 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  Patient presented to the ED with complaints of fevers, dysuria, and low back pain.  She also admits to a few episodes of diarrhea.  She was found to be in SIRS as evident by fever and leukocytosis, and elevated lactic acid. UA was positive for nitrite, many bacteria, some WBCs.   She was admitted and started on IV antibiotics.   Hospital Course:   Sepsis/SIRS  -Patient presented with fever, leukocytosis, elevated lactic acid, UA with positive nitrites, many bacteria but  limited pyuria. Infectious disease was following patient and recommended discontinuation of PICC line, as it may be possible source of infection. Daptomycin was discontinued, and there was no further indication for continued osteomyelitis therapy.  Urine cultures grew Escherichia coli, and she was placed on IV Rocephin for 3 days. Blood cultures remained without growth.  Trach aspirate cultures with staph aureus, however patient had no pulmonary symptoms or CXR changes concerning for pneumonia.  On discharge, patient remained afebrile, leukocytosis has resolved.  -Continue Nitrofurantoin 100 MG twice a day for 4 days -[facility] infectious disease in 2 weeks  Chronic osteomyelitis of right third finger -Patient had debridement on 06/05/2015, was placed on IV daptomycin.  During this hospitalization,  daptomycin was discontinued, PICC line removed.  Per ID, there was no indication for further antibiotic coverage for osteomylitis.  Hypokalemia -Potassium normal on discharge, treated with oral potassium.  Diabetes mellitus type 2 -Hemoglobin A1c 7.2.  CBGs remained stable, patient was maintained on Invocana and SSI. -On discharge continue insulin 28 units twice a day, SSI, Invokana 100 MG daily -Follow up with PCP for Hgb A1c recheck in 3 months  Diarrhea -Patient with complaints of diarrhea on admission, C. Difficile negative.  Continue Questran PRN  Hypertension -Blood pressure remained stable.  Continue losartan 833 MG daily, Bystolic 10 mg daily  Congestive heart failure -1+ LE edema on discharge. No records of Echo found. Continue metolazone 5 mg daily, Bumex 2mg  dailyContinue potassium supplements.   -Follow up with PCP for 2D echo  Hx of DVT -INR therapeutic on discharge at 2.3.  Continue coumadin as instructed -Follow up INR recheck with PCP  Hyperlipidemia -Continue Lipitor 10 mg daily  Asthma -No complaints. Continue Albuterol  Gout -Continue colchicine  0.6 mg daily, Urolic 80 mg daily  Anxiety -Stable on discharge, continue Xanax 0.5 MG twice a day, BuSpar 7.5mg  BID  Depression -No SI, HI.  Continue Cymbalta 30 mg twice a day  Peripheral neuropathy -Continue gabapentin 100 MG PRN  GERD -Continue omeprazole 40 mg twice a day  Urinary incontinence -Continue Toviaz 4 mg daily  Tremors -Continue Mysoline 50mg  daily  Morbid obesity BMI 66.7   Obesity hypoventilation syndrome/  -Tracheal dependent  obstructive sleep apnea -continue nighttime CPAP Procedures:  None  Consultations:  Infectious disease  Discharge Exam: Filed Vitals:   07/15/14 0639  BP: 159/74  Pulse: 60  Temp: 98.2 F (36.8 C)  Resp: 20     Exam General: Morbid obese looking Caucasian female in NAD. With Trach Eyes: Anicteric account.  Cardiovascular: Regular rate and rhythm.  No murmurs, rubs, or gallops. Respiratory: Clear to auscultate bilaterally.  No rhonchi or crepitations. Abdomen: Obese, Soft nontender bowel sounds present. No guarding or rigidity.  Musculoskeletal: 1+ BLE edema Neurologic: Alert awake oriented to time place and person.    Discharge Instructions       Discharge Instructions    Diet Carb Modified    Complete by:  As directed      Increase activity slowly    Complete by:  As directed             Medication List    STOP taking these medications        amoxicillin-clavulanate 875-125 MG per tablet  Commonly known as:  AUGMENTIN     heparin lock flush 100 UNIT/ML Soln injection      TAKE these medications        acetaminophen 500 MG tablet  Commonly known as:  TYLENOL  Take 1,000 mg by mouth every 6 (six) hours as needed for mild pain. For pain     albuterol (2.5 MG/3ML) 0.083% nebulizer solution  Commonly known as:  PROVENTIL  Take 2.5 mg by nebulization every 4 (four) hours as needed. For shortness of breath     ALPRAZolam 0.5 MG tablet  Commonly known as:  XANAX  Take 0.5 mg by mouth 2 (two)  times daily as needed. For anxiety     ANTIVERT 25 MG tablet  Generic drug:  meclizine  Take 25 mg by mouth at bedtime.     atorvastatin 10 MG tablet  Commonly known as:  LIPITOR  Take 10 mg by mouth at bedtime.     bumetanide 2 MG tablet  Commonly known as:  BUMEX  Take 1 tablet (2 mg total) by mouth daily.     busPIRone 5 MG tablet  Commonly known as:  BUSPAR  Take 7.5 mg by mouth 2 (two) times daily.     cholestyramine 4 G packet  Commonly known as:  QUESTRAN  Take 1 packet by mouth as needed. For loose stools, IBS.     clotrimazole 1 % cream  Commonly known as:  LOTRIMIN  Apply 1 application topically daily. To stomach     colchicine 0.6 MG tablet  Take 0.6 mg by mouth daily.     DULoxetine 30 MG capsule  Commonly known as:  CYMBALTA  TAKE 1 CAPSULE (30 MG TOTAL) BY MOUTH 2 (TWO) TIMES DAILY.     ergocalciferol 50000 UNITS capsule  Commonly known as:  VITAMIN D2  Take 50,000 Units by mouth once a  week. Take on Fridays     fish oil-omega-3 fatty acids 1000 MG capsule  Take 1 g by mouth 2 (two) times daily.     gabapentin 100 MG capsule  Commonly known as:  NEURONTIN  TAKE ONE CAPSULE BY MOUTH EVERY MORNING AND 2 CAPS IN THE AFTERNOON AND 2 CAPS AT BEDTIME     guaiFENesin 600 MG 12 hr tablet  Commonly known as:  MUCINEX  Take 600 mg by mouth 2 (two) times daily.     HUMULIN R 500 UNIT/ML Soln injection  Generic drug:  insulin regular human CONCENTRATED  Inject 28 Units into the skin 2 (two) times daily before a meal.     HYDROcodone-acetaminophen 5-325 MG per tablet  Commonly known as:  NORCO/VICODIN  Take 1 tablet by mouth every 6 (six) hours as needed for moderate pain.     INVOKANA 100 MG Tabs tablet  Generic drug:  canagliflozin  Take 100 mg by mouth daily.     ipratropium 0.02 % nebulizer solution  Commonly known as:  ATROVENT  Take 0.5 mg by nebulization 4 (four) times daily as needed for wheezing or shortness of breath.     losartan 100 MG  tablet  Commonly known as:  COZAAR  Take 1 tablet (100mg ) by mouth daily.     metolazone 5 MG tablet  Commonly known as:  ZAROXOLYN  Take 1 tablet (5 mg total) by mouth See admin instructions. 3 times weekly as needed for edema     multivitamin capsule  Take 1 capsule by mouth daily.     nebivolol 10 MG tablet  Commonly known as:  BYSTOLIC  Take 10 mg by mouth daily.     nitrofurantoin (macrocrystal-monohydrate) 100 MG capsule  Commonly known as:  MACROBID  Take 1 capsule (100 mg total) by mouth 2 (two) times daily.     omeprazole 40 MG capsule  Commonly known as:  PRILOSEC  Take 40 mg by mouth 2 (two) times daily.     ondansetron 4 MG tablet  Commonly known as:  ZOFRAN  Take 1 tablet (4 mg total) by mouth every 8 (eight) hours as needed for nausea.     potassium chloride SA 20 MEQ tablet  Commonly known as:  K-DUR,KLOR-CON  Take 20 mEq by mouth 4 (four) times daily. 1 in AM, 2 at lunch, 1 at supper and 2 at bedtime     primidone 50 MG tablet  Commonly known as:  MYSOLINE  TAKE ONE TABLET TWICE DAILY     PROBIOTIC PO  Take 1 tablet by mouth daily.     TOVIAZ 4 MG Tb24 tablet  Generic drug:  fesoterodine  Take 4 mg by mouth daily.     ULORIC 80 MG Tabs  Generic drug:  Febuxostat  Take 80 mg by mouth at bedtime.     VICTOZA 18 MG/3ML Soln injection  Generic drug:  Liraglutide  Inject 1.8 mg into the skin daily.     VITAMIN B COMPLEX PO  Take 1 tablet by mouth daily.     warfarin 5 MG tablet  Commonly known as:  COUMADIN  - Take 5 mg by mouth daily at 6 PM. Take 5 mg on Monday, Tuesday, Wednesday, Thursday, Friday, and Saturday.  - Take 2.5mg  (half a tablet) on Sunday.      ASK your doctor about these medications        budesonide 0.25 MG/2ML nebulizer solution  Commonly known as:  PULMICORT  Take  0.25 mg by nebulization 2 (two) times daily as needed (shortness of breath).       Allergies  Allergen Reactions  . Codeine Other (See Comments)    Heart  problems  . Molds & Smuts Shortness Of Breath and Rash  . Allopurinol Other (See Comments)    GI problems   . Ciprofloxacin Other (See Comments)    GI problems  . Doxycycline Other (See Comments)    Gastric problems  . Fexofenadine Other (See Comments)    Hurts joints/pain  . Keflex [Cephalexin] Diarrhea and Nausea Only  . Nitroglycerin Er Nausea And Vomiting  . Sulfa Drugs Cross Reactors Nausea And Vomiting  . Sulfonamide Derivatives Nausea Only  . Accolate [Zafirlukast] Other (See Comments)    Reaction unknown  . Amoxicillin-Pot Clavulanate Other (See Comments)    Other reaction(s): Unknown  . Morphine Other (See Comments)    unknown  . Septra [Sulfamethoxazole-Trimethoprim] Other (See Comments)    unknown   Follow-up Information    Follow up with [facility]             .   Contact information:   [address] [place] [id number]        The results of significant diagnostics from this hospitalization (including imaging, microbiology, ancillary and laboratory) are listed below for reference.    Significant Diagnostic Studies: Ir Fluoro Guide Cv Line Right  06/22/2014   CLINICAL DATA:  54 year old female with a right hand cellulitis in the of durable venous access for outpatient IV antibiotic therapy.  EXAM: IR RIGHT FLOURO GUIDE CV LINE; IR ULTRASOUND GUIDANCE VASC ACCESS RIGHT  FLUOROSCOPY TIME:  24 seconds  TECHNIQUE: The right arm was prepped with chlorhexidine, draped in the usual sterile fashion using maximum barrier technique (cap and mask, sterile gown, sterile gloves, large sterile sheet, hand hygiene and cutaneous antiseptic). Local anesthesia was attained by infiltration with 1% lidocaine.  Ultrasound demonstrated patency of the right basilic vein, and this was documented with an image. Under real-time ultrasound guidance, this vein was accessed with a 21 gauge micropuncture needle and image documentation was  performed. The needle was exchanged over a guidewire for a peel-away sheath through which a 44 cm 5 [place] dual lumen power injectable PICC was advanced, and positioned with its tip at the lower SVC/right atrial junction. Fluoroscopy during the procedure and fluoro spot radiograph confirms appropriate catheter position. The catheter was flushed, secured to the skin with Prolene sutures, and covered with a sterile dressing.  COMPLICATIONS: None.  The patient tolerated the procedure well.  IMPRESSION: Successful placement of a right basilic vein approach dual lumen power injectable PICC with sonographic and fluoroscopic guidance. The catheter is ready for use.  Signed,  Criselda Peaches, MD  [facility]  [facility]   Electronically Signed   By: Jacqulynn Cadet [name]   On: 06/22/2014 17:34   Ir US Guide Vasc Access Right  06/22/2014   CLINICAL DATA:  54 year old female with a right hand cellulitis in the of durable venous access for outpatient IV antibiotic therapy.  EXAM: IR RIGHT FLOURO GUIDE CV LINE; IR ULTRASOUND GUIDANCE VASC ACCESS RIGHT  FLUOROSCOPY TIME:  24 seconds  TECHNIQUE: The right arm was prepped with chlorhexidine, draped in the usual sterile fashion using maximum barrier technique (cap and mask, sterile gown, sterile gloves, large sterile sheet, hand hygiene and cutaneous antiseptic). Local anesthesia was attained by infiltration with 1%  lidocaine.  Ultrasound demonstrated patency of the right basilic vein, and this was documented with an image. Under real-time ultrasound guidance, this vein was accessed with a 21 gauge micropuncture needle and image documentation was performed. The needle was exchanged over a guidewire for a peel-away sheath through which a 44 cm 5 [place] dual lumen power injectable PICC was advanced, and positioned with its tip at the lower SVC/right atrial junction. Fluoroscopy during the procedure and fluoro spot radiograph  confirms appropriate catheter position. The catheter was flushed, secured to the skin with Prolene sutures, and covered with a sterile dressing.  COMPLICATIONS: None.  The patient tolerated the procedure well.  IMPRESSION: Successful placement of a right basilic vein approach dual lumen power injectable PICC with sonographic and fluoroscopic guidance. The catheter is ready for use.  Signed,  Criselda Peaches, MD  [facility]  [facility]   Electronically Signed   By: Jacqulynn Cadet [name]   On: 06/22/2014 17:34   Dg Chest Port 1 View  07/11/2014   CLINICAL DATA:  Shortness of breath for 1 day, history CHF, diabetes, GERD, hypertension  EXAM: PORTABLE CHEST - 1 VIEW  COMPARISON:  Portable exam 1737 hr compared to 05/18/2014 and 06/22/2014 fluoroscopic exam  FINDINGS: Tracheostomy tube again seen projecting over tracheal air column.  Enlargement of cardiac silhouette.  Mediastinal contours and pulmonary vascularity normal.  RIGHT arm PICC line with tip projecting over RIGHT subclavian vein, partially withdrawn since 06/22/2014  Mild RIGHT basilar atelectasis.  No definite infiltrate, pleural effusion or pneumothorax.  IMPRESSION: Enlargement of cardiac silhouette with mild RIGHT basilar atelectasis.  RIGHT arm PICC line has been partially withdrawn since 06/22/2014, tip now projecting over RIGHT subclavian vein ; if positioning is desired within the SVC, recommend replacement.   Electronically Signed   By: Lavonia Dana [name]   On: 07/11/2014 18:00    Microbiology: Recent Results (from the past 240 hour(s))  Urine culture     Status: None   Collection Time: 07/11/14 12:56 PM  Result Value Ref Range Status   Specimen Description URINE, RANDOM  Final   Special Requests Immunocompromised  Final   Colony Count   Final    >=100,000 COLONIES/ML Performed at [facility]    Culture   Final    ESCHERICHIA COLI Performed at [facility]     Report Status 07/13/2014 FINAL  Final   Organism ID, Bacteria ESCHERICHIA COLI  Final      Susceptibility   Escherichia coli - MIC*    AMPICILLIN RESISTANT      CEFAZOLIN <=4 SENSITIVE Sensitive     CEFTRIAXONE <=1 SENSITIVE Sensitive     CIPROFLOXACIN >=4 RESISTANT Resistant     GENTAMICIN <=1 SENSITIVE Sensitive     LEVOFLOXACIN >=8 RESISTANT Resistant     NITROFURANTOIN <=16 SENSITIVE Sensitive     TOBRAMYCIN <=1 SENSITIVE Sensitive     TRIMETH/SULFA >=320 RESISTANT Resistant     PIP/TAZO >=128 RESISTANT Resistant     * ESCHERICHIA COLI  Blood culture (routine x 2)     Status: None (Preliminary result)   Collection Time: 07/11/14  2:18 PM  Result Value Ref Range Status   Specimen Description BLOOD LEFT ARM  Final   Special Requests BOTTLES DRAWN AEROBIC AND ANAEROBIC 5.5CC  Final   Culture   Final           BLOOD CULTURE RECEIVED NO GROWTH TO DATE CULTURE WILL BE HELD FOR 5 DAYS  BEFORE ISSUING A FINAL NEGATIVE REPORT Performed at [facility]    Report Status PENDING  Incomplete  Blood culture (routine x 2)     Status: None (Preliminary result)   Collection Time: 07/11/14  2:24 PM  Result Value Ref Range Status   Specimen Description BLOOD RIGHT WRIST  Final   Special Requests BOTTLES DRAWN AEROBIC AND ANAEROBIC 5CC  Final   Culture   Final           BLOOD CULTURE RECEIVED NO GROWTH TO DATE CULTURE WILL BE HELD FOR 5 DAYS BEFORE ISSUING A FINAL NEGATIVE REPORT Performed at [facility]    Report Status PENDING  Incomplete  Culture, respiratory (NON-Expectorated)     Status: None (Preliminary result)   Collection Time: 07/11/14  9:52 PM  Result Value Ref Range Status   Specimen Description TRACHEAL ASPIRATE  Final   Special Requests Normal  Final   Gram Stain   Final    RARE WBC PRESENT,BOTH PMN AND MONONUCLEAR NO SQUAMOUS EPITHELIAL CELLS SEEN NO ORGANISMS SEEN Performed at [facility]    Culture   Final    MODERATE STAPHYLOCOCCUS  AUREUS Note: RIFAMPIN AND GENTAMICIN SHOULD NOT BE USED AS SINGLE DRUGS FOR TREATMENT OF STAPH INFECTIONS. FEW PSEUDOMONAS AERUGINOSA Performed at [facility]    Report Status PENDING  Incomplete  MRSA PCR Screening     Status: Abnormal   Collection Time: 07/12/14  1:16 PM  Result Value Ref Range Status   MRSA by PCR POSITIVE (A) NEGATIVE Final    Comment:        The GeneXpert MRSA Assay (FDA approved for NASAL specimens only), is one component of a comprehensive MRSA colonization surveillance program. It is not intended to diagnose MRSA infection nor to guide or monitor treatment for MRSA infections. RESULT CALLED TO, READ BACK BY AND VERIFIED WITH: JAMA RN 16:25 07/12/14 ([email or web address])   Clostridium Difficile by PCR     Status: None   Collection Time: 07/14/14 10:37 AM  Result Value Ref Range Status   C difficile by pcr NEGATIVE NEGATIVE Final     Labs: Basic Metabolic Panel:  Recent Labs Lab 07/11/14 1415 07/12/14 0554 07/14/14 0544 07/15/14 0600  NA 134* 133* 138 138  K 3.3* 3.1* 3.1* 3.7  CL 96 101 97 96  CO2 25 24 30 30   GLUCOSE 164* 169* 193* 190*  BUN 19 15 16 16   CREATININE 1.22* 1.00 0.86 1.01  CALCIUM 9.0 8.2* 8.6 8.3*   Liver Function Tests:  Recent Labs Lab 07/11/14 1415  AST 37  ALT 29  ALKPHOS 70  BILITOT 0.7  PROT 7.9  ALBUMIN 3.6   No results for input(s): LIPASE, AMYLASE in the last 168 hours. No results for input(s): AMMONIA in the last 168 hours. CBC:  Recent Labs Lab 07/11/14 1243 07/12/14 0554 07/14/14 0544 07/15/14 0600  WBC 15.7* 7.3 5.5 5.6  NEUTROABS 13.1*  --   --   --   HGB 13.4 12.3 13.0 12.8  HCT 40.9 37.2 39.4 37.6  MCV 94.5 93.2 92.3 91.3  PLT 93* 87* 96* 96*   Cardiac Enzymes:  Recent Labs Lab 07/12/14 0554  CKTOTAL 144   BNP: BNP (last 3 results)  Recent Labs  05/18/14 1335  PROBNP 927.1*   CBG:  Recent Labs Lab 07/14/14 0814 07/14/14 1216 07/14/14 1707 07/14/14 2116 07/15/14 0759   GLUCAP 167* 207* 178* 218* 148*       Signed:  Alene Mires,  Sahar PA-C  [facility] 07/15/2014, 11:06 AM

## 2014-07-15 NOTE — Clinical Social Work Note (Signed)
BSW intern informed patient's relative, [name], that patient will be discharged home by ambulance.

## 2014-07-15 NOTE — Trach Care Team (Signed)
[place] Progression Note   Patient Details Name: PALESTINE MOSCO MRN: [id number] DOB: 1960/08/24 Today's Date: 07/15/2014   Tracheostomy Assessment    Tracheostomy Shiley 7 mm Uncuffed (Active)  Status Passy [place]ir Speaking valve 07/15/2014 11:49 AM  Site Assessment Clean;Dry 07/15/2014 11:49 AM  Site Care Other (Comment) 07/15/2014  8:30 AM  Inner Cannula Care Changed/new 07/15/2014  8:30 AM  Ties Assessment Clean;Dry;Secure 07/15/2014 11:49 AM  Cuff pressure (cm) 0 cm 07/15/2014  3:24 AM  Emergency Equipment at bedside Yes 07/15/2014 11:49 AM     Tracheostomy Shiley 7 mm Uncuffed (Active)     Care Needs     Respiratory Therapy Tracheostomy: Chronic trach O2 Device: Not Delivered FiO2 (%): 21 % SpO2: 97 % Education:  (none needed) Follow up recommendations:  (plan is for pt to be discharged today home) Respiratory barriers to progression:  (chronic trach 37yrs no education needed)    Speech Language Pathology  SLP chart review complete: Patient does not need SLP services at this time (has PMSV, uses at baseline. ?trach clinic)   Physical Therapy      Occupational Therapy      Nutritional Patient's Current Diet: Carb modified    Case Management/Social Work      Clinical biochemist Care Team/Provider Recommendations [place] Team Members Present-  Ciro Backer, RT, Alvino Blood, SW, Molli Barrows, RD, Herbie Baltimore, SLP    Chronic trach - cares for trach at home. No needs. For discharge today.          Siarra Gilkerson, Jaci Carrel (scribe for team) 07/15/2014, 1:58 PM

## 2014-07-15 NOTE — Progress Notes (Signed)
Patient discharge teaching given, including activity, diet, follow-up appoints, and medications. Patient verbalized understanding of all discharge instructions. IV access was d/c'd. Vitals are stable. Skin is intact except as charted in most recent assessments. Pt to be transported home by EMS.

## 2014-07-16 LAB — CULTURE, RESPIRATORY: SPECIAL REQUESTS: NORMAL

## 2014-07-16 LAB — CULTURE, RESPIRATORY W GRAM STAIN

## 2014-07-17 LAB — CULTURE, BLOOD (ROUTINE X 2)
Culture: NO GROWTH
Culture: NO GROWTH

## 2014-07-18 ENCOUNTER — Other Ambulatory Visit: Payer: Self-pay | Admitting: Neurology

## 2014-07-19 NOTE — Telephone Encounter (Signed)
OK to refill as mysoline (primidone) 50mg  twice daily.

## 2014-07-19 NOTE — Telephone Encounter (Signed)
Don't see this medication mentioned in last two office notes. Please advise.

## 2014-08-02 ENCOUNTER — Encounter: Payer: Self-pay | Admitting: Infectious Diseases

## 2014-08-02 ENCOUNTER — Ambulatory Visit (INDEPENDENT_AMBULATORY_CARE_PROVIDER_SITE_OTHER): Payer: Medicare Other | Admitting: Infectious Diseases

## 2014-08-02 VITALS — BP 109/71 | HR 70 | Temp 98.1°F | Ht 65.0 in | Wt >= 6400 oz

## 2014-08-02 DIAGNOSIS — N390 Urinary tract infection, site not specified: Secondary | ICD-10-CM

## 2014-08-02 DIAGNOSIS — E669 Obesity, unspecified: Secondary | ICD-10-CM

## 2014-08-02 DIAGNOSIS — L0390 Cellulitis, unspecified: Secondary | ICD-10-CM

## 2014-08-02 DIAGNOSIS — B9620 Unspecified Escherichia coli [E. coli] as the cause of diseases classified elsewhere: Secondary | ICD-10-CM

## 2014-08-02 DIAGNOSIS — E119 Type 2 diabetes mellitus without complications: Secondary | ICD-10-CM

## 2014-08-02 DIAGNOSIS — E1169 Type 2 diabetes mellitus with other specified complication: Secondary | ICD-10-CM

## 2014-08-02 DIAGNOSIS — Z8619 Personal history of other infectious and parasitic diseases: Secondary | ICD-10-CM

## 2014-08-02 DIAGNOSIS — L0291 Cutaneous abscess, unspecified: Secondary | ICD-10-CM

## 2014-08-02 MED ORDER — ORITAVANCIN DIPHOSPHATE 400 MG IV SOLR: 1200.0000 mg | Freq: Once | INTRAVENOUS | Status: DC

## 2014-08-02 NOTE — Assessment & Plan Note (Addendum)
Suspect that she has deep infection of her 2nd digit. She has multiple drug allergies, considered giving her zyvox (significantly expensive), clinda could flare her C diff.  Will give her one time dose of oritivancin and see her back in 2 weeks. She denies hx of vanco allergies (although I do not believe this would cross react).

## 2014-08-02 NOTE — Assessment & Plan Note (Signed)
Appears to be quiescent at this point.

## 2014-08-02 NOTE — Progress Notes (Signed)
   Subjective:    Patient ID: [name], female    DOB: [birth year], 54 y.o.   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 Cx MRSA and GBS. Was treated with daptomycin.  Adm 1-17 to 07-15-14 with uro-SIRS (Cx E coli). She was treated with IV anbx then transitioned to nitrofurantoin. She also has a hx of C diff, was (-) in hospital 07-14-14.  Today with concerns about her R hand. Now having issues with R index finger. The nail split and has had bleeding. Was seen by hand surgery and has been on augmentin for last 7 days. Has been on 7-8 different anbx since thanksgiving. No f/c. Loose BM are better. No proximal erythema.  She has multiple drug allergies.  FSG have been 102-119 No problems with trach site.    Review of Systems  Constitutional: Negative for chills and unexpected weight change.  Gastrointestinal: Negative for diarrhea.  Genitourinary: Negative for dysuria.  Musculoskeletal: Positive for myalgias.       Objective:   Physical Exam  Constitutional: She appears well-developed and well-nourished.  HENT:  Mouth/Throat: No oropharyngeal exudate.  Eyes: EOM are normal. Pupils are equal, round, and reactive to light.  Neck: Neck supple.  Lurline Idol site is clean.   Cardiovascular: Normal rate, regular rhythm and normal heart sounds.   Pulmonary/Chest: Effort normal and breath sounds normal.  Abdominal: Soft. Bowel sounds are normal. She exhibits distension. There is no tenderness.  Musculoskeletal:  R index finger is tender, there is dried blood over nail bed and there are punctate lesions which could be pustules.  There is no proximal erythema.   Lymphadenopathy:    She has no cervical adenopathy.          Assessment & Plan:

## 2014-08-02 NOTE — Assessment & Plan Note (Signed)
Appears to be resolved. Will not reCx.

## 2014-08-02 NOTE — Assessment & Plan Note (Signed)
Appears to be fairly well controlled. F/u with PCP.

## 2014-08-04 ENCOUNTER — Ambulatory Visit (HOSPITAL_COMMUNITY)
Admission: RE | Admit: 2014-08-04 | Discharge: 2014-08-04 | Disposition: A | Discharge status: Home / Self Care | Payer: Medicare Other | Source: Ambulatory Visit | Attending: Infectious Diseases | Admitting: Infectious Diseases

## 2014-08-04 ENCOUNTER — Telehealth: Payer: Self-pay | Admitting: *Deleted

## 2014-08-04 DIAGNOSIS — L03031 Cellulitis of right toe: Secondary | ICD-10-CM | POA: Insufficient documentation

## 2014-08-04 MED ORDER — HEPARIN SOD (PORK) LOCK FLUSH 100 UNIT/ML IV SOLN: 250.0000 [IU] | INTRAVENOUS | Status: DC | PRN

## 2014-08-04 MED ORDER — SODIUM CHLORIDE 0.9 % IJ SOLN
10.0000 mL | INTRAMUSCULAR | Status: AC | PRN
  Administered 2014-08-04: 10 mL

## 2014-08-04 MED ORDER — ORITAVANCIN DIPHOSPHATE 400 MG IV SOLR
1200.0000 mg | Freq: Once | INTRAVENOUS | Status: AC
  Administered 2014-08-04: 1200 mg via INTRAVENOUS
  Filled 2014-08-04: qty 120

## 2014-08-04 NOTE — Telephone Encounter (Signed)
Patient completed her IV infusion of Oritavancin today. She also has a prescription of Augmentin that [name] prescribed.  She is confused, would like to know if she should take both, as she was told that the Oritavancin is a long-acting antibiotic.  She is concerned, especially considering the length of time she has been on antibiotics and her history of c Diff.   RN contacted Dr. Levell July office, spoke with Herbie Baltimore PA.  RN confirmed that [name] was aware of the oritavancin order and administration, but still wanted patient to take another round of augmentin.   Please advise.  Landis Gandy, RN

## 2014-08-04 NOTE — Procedures (Signed)
[facility]  Procedure Note  TANYIAH LAURICH WFU:[id number] DOB: October 21, 1960 DOA: 08/04/2014   PCP: [name], MD   Associated Diagnosis: Cellulitis Right 2nd digit   Procedure Note: Infusion of oritavancin    Condition During Procedure:   Pt tolerated well   Condition at Discharge:  Pt alert, oriented, accompanied by caregiver, transported by wheelchair to car; no complications noted   Tamala Julian, Salley Slaughter, [facility]

## 2014-08-05 ENCOUNTER — Ambulatory Visit: Payer: Medicare Other | Admitting: Interventional Cardiology

## 2014-08-06 ENCOUNTER — Telehealth: Payer: Self-pay

## 2014-08-06 NOTE — Telephone Encounter (Signed)
Patient called needs a tier expection for her bystolic and metolazone  Please call patient

## 2014-08-06 NOTE — Telephone Encounter (Signed)
Ok to take both thanks

## 2014-08-10 NOTE — Telephone Encounter (Signed)
Notified patient. She will start it today. Pt to follow up 2/22 at [facility].

## 2014-08-16 ENCOUNTER — Telehealth: Payer: Self-pay | Admitting: Neurology

## 2014-08-16 ENCOUNTER — Ambulatory Visit: Payer: Medicare Other | Admitting: Infectious Diseases

## 2014-08-16 ENCOUNTER — Ambulatory Visit: Payer: Medicare Other | Admitting: Neurology

## 2014-08-16 NOTE — Telephone Encounter (Signed)
08/16/14 appt marked as a no show b/c pt did not give more than 24hrs prior notification but a no show letter will not be sent / Sherri S.

## 2014-08-16 NOTE — Telephone Encounter (Signed)
Noted  

## 2014-08-16 NOTE — Telephone Encounter (Signed)
Pt called to r/s her f/u for today 08/16/14 due to her not feeling well. Pt r/s to 09/16/14 at 9:15AM.

## 2014-08-20 NOTE — Telephone Encounter (Signed)
I spoke with the patient. She has no paper work from [employer] for tier exception.  Insurance is with [employer] Customer service #- [phone] Provider #- [phone] [email or web address]   I have checked with the refill department- they do have tier exception forms- will fill out the forms we have to see and submit.

## 2014-08-23 ENCOUNTER — Ambulatory Visit (INDEPENDENT_AMBULATORY_CARE_PROVIDER_SITE_OTHER): Payer: Medicare Other | Admitting: Infectious Diseases

## 2014-08-23 ENCOUNTER — Encounter: Payer: Self-pay | Admitting: Infectious Diseases

## 2014-08-23 VITALS — BP 131/73 | HR 83 | Temp 98.0°F | Ht 65.0 in | Wt >= 6400 oz

## 2014-08-23 DIAGNOSIS — T887XXA Unspecified adverse effect of drug or medicament, initial encounter: Secondary | ICD-10-CM

## 2014-08-23 DIAGNOSIS — T50905A Adverse effect of unspecified drugs, medicaments and biological substances, initial encounter: Secondary | ICD-10-CM | POA: Insufficient documentation

## 2014-08-23 DIAGNOSIS — L0390 Cellulitis, unspecified: Secondary | ICD-10-CM

## 2014-08-23 DIAGNOSIS — L0291 Cutaneous abscess, unspecified: Secondary | ICD-10-CM

## 2014-08-23 MED ORDER — HYDROXYZINE HCL 10 MG PO TABS: 10.0000 mg | ORAL_TABLET | Freq: Three times a day (TID) | ORAL | Status: DC | PRN

## 2014-08-23 NOTE — Progress Notes (Signed)
   Subjective:    Patient ID: [name], female    DOB: 10-06-1960, 54 y.o.   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 Cx MRSA and GBS. Was treated with daptomycin.  Adm 1-17 to 07-15-14 with uro-SIRS (Cx E coli). She was treated with IV anbx then transitioned to nitrofurantoin. She also has a hx of C diff, was (-) in hospital 07-14-14.  Today with concerns about her R hand. Now having issues with R index finger. The nail split and has had bleeding. Was seen by hand surgery and has been on augmentin for last 7 days. Has been on 7-8 different anbx since thanksgiving.   She returned to ID 08-02-14 and was suspected to have worsening of her finger infection. She was given a single dose of oritavncin on 2-10.  Has had pruritis since getting anbx infusion. Has been on her L side. She has scratch her skin and left marks. Has been using oatmeal body wash, zinc oxide.  Her finger is improved. She states that she is to be d/c from her hand surgeon soon. No d/c from her finger, rare blood. SHe had plain films done at her hand surgeon's office, no osteo per pt.    Review of Systems  Constitutional: Negative for fever and chills.  Skin: Positive for rash.       Objective:   Physical Exam  Constitutional:  Non-toxic appearance. No distress.  Musculoskeletal:       Arms: Skin: She is not diaphoretic.             Assessment & Plan:

## 2014-08-23 NOTE — Assessment & Plan Note (Signed)
Have added oritivancin to her list of drug allergies although would consider using again if needed.  Will give her rx for atarax. Hesitant to give her rx for steroids given her hx of DM and her risk of infection to her digits.

## 2014-08-23 NOTE — Assessment & Plan Note (Signed)
Her finger is improved. Will defer her f/u to her hand surgeon, her PCP and her rheumatologist.  Given her hx this will need careful observation. I am glad to see her back prn

## 2014-08-23 NOTE — Telephone Encounter (Signed)
Forms in Dr. Hassell Done folder to sign.

## 2014-08-26 ENCOUNTER — Telehealth: Payer: Self-pay | Admitting: Licensed Clinical Social Worker

## 2014-08-26 NOTE — Telephone Encounter (Signed)
Patient called stating that the medication prescribed for her rash is not helping and would like to know if there is anything else that can be prescribed.

## 2014-08-26 NOTE — Telephone Encounter (Signed)
She is going to get her PCP to make her a referral, since her insurance requires it.

## 2014-08-26 NOTE — Telephone Encounter (Signed)
Can she be seen by derm?

## 2014-09-02 ENCOUNTER — Ambulatory Visit: Payer: Medicare Other | Admitting: Dietician

## 2014-09-03 ENCOUNTER — Ambulatory Visit: Payer: Medicare Other | Admitting: Dietician

## 2014-09-06 NOTE — Telephone Encounter (Signed)
Tier exception forms faxed. Confirmation received.

## 2014-09-07 ENCOUNTER — Ambulatory Visit (INDEPENDENT_AMBULATORY_CARE_PROVIDER_SITE_OTHER): Payer: Medicare Other | Admitting: Interventional Cardiology

## 2014-09-07 ENCOUNTER — Other Ambulatory Visit: Payer: Self-pay | Admitting: *Deleted

## 2014-09-07 ENCOUNTER — Telehealth: Payer: Self-pay | Admitting: *Deleted

## 2014-09-07 ENCOUNTER — Encounter: Payer: Self-pay | Admitting: Interventional Cardiology

## 2014-09-07 DIAGNOSIS — I5032 Chronic diastolic (congestive) heart failure: Secondary | ICD-10-CM

## 2014-09-07 MED ORDER — OMEGA-3 FISH OIL 1200 MG PO CAPS: 2400.0000 mg | ORAL_CAPSULE | Freq: Two times a day (BID) | ORAL | Status: DC

## 2014-09-07 NOTE — Patient Instructions (Signed)
Your physician recommends that you continue on your current medications as directed. Please refer to the Current Medication list given to you today.  Your physician wants you to follow-up in: 1 year with Dr. Varanasi. You will receive a reminder letter in the mail two months in advance. If you don't receive a letter, please call our office to schedule the follow-up appointment.  

## 2014-09-07 NOTE — Progress Notes (Signed)
Patient ID: CHEVELLE COULSON, female   DOB: 03/07/1961, 54 y.o.   MRN: [id number]     Patient ID: TERRISA CURFMAN MRN: [id number] DOB/AGE: 09-17-1960 54 y.o.   Referring Physician  Dr. Kelton Pillar   Reason for Consultation  CHF  HPI: 54 y/o who has obesity -hypoventilation syndrome.  She is trach dependent.  She has morbid obesity, hypertension, DM, asthma.  She has had diastolic dysfunction as well.  She had an echo in 2010 showing normal LV function.  She has never smoked.  Mother had MI.  Father had CVA.  Sister with HTN.  She is on multiple meds for DM.  Last A1C was controlled with Dr. Laurann Montana.  Overall, she is feeling at baseline. She does have chronic lower extremity swelling but is stable- some days better than others. Her INR has been well-controlled. She states she is getting checked about every 2 months. Her primary care doctor typically checks her INR. She follows a strict diet to keep her Coumadin therapeutic.  She has had  Pyelonephritis in the past. She states she has not had any cardiac issues requiring hospitalization for several years. She does have a significant effect from the diuretics that she takes.  Hospitalized in 1/16 for UTI.   She has had some severe hand problems.  She required surgery on some fingers or gangrene.  She has lost a fingernail.  SHe is under the care of the hand specialist.    Using Zaroxyln 2-3 x /week.   Current Outpatient Prescriptions  Medication Sig Dispense Refill  . acetaminophen (TYLENOL) 500 MG tablet Take 1,000 mg by mouth every 6 (six) hours as needed for mild pain. For pain    . albuterol (PROVENTIL) (2.5 MG/3ML) 0.083% nebulizer solution Take 2.5 mg by nebulization every 4 (four) hours as needed. For shortness of breath    . ALPRAZolam (XANAX) 0.5 MG tablet Take 0.5 mg by mouth 2 (two) times daily as needed. For anxiety    . amoxicillin (AMOXIL) 875 MG tablet Take 875 mg by mouth 2 (two) times daily.  0  . atorvastatin (LIPITOR) 10 MG  tablet Take 10 mg by mouth at bedtime.     . B Complex Vitamins (VITAMIN B COMPLEX PO) Take 1 tablet by mouth daily.     . budesonide (PULMICORT) 0.25 MG/2ML nebulizer solution Take 0.25 mg by nebulization 2 (two) times daily as needed (shortness of breath).    . bumetanide (BUMEX) 2 MG tablet Take 1 tablet (2 mg total) by mouth daily. 90 tablet 3  . busPIRone (BUSPAR) 5 MG tablet Take 7.5 mg by mouth 2 (two) times daily.     . Canagliflozin (INVOKANA) 100 MG TABS Take 100 mg by mouth daily.     . cholestyramine (QUESTRAN) 4 G packet Take 1 packet by mouth as needed. For loose stools, IBS.    . clotrimazole (LOTRIMIN) 1 % cream Apply 1 application topically daily. To stomach    . colchicine 0.6 MG tablet Take 0.6 mg by mouth daily.    . DULoxetine (CYMBALTA) 30 MG capsule TAKE 1 CAPSULE (30 MG TOTAL) BY MOUTH 2 (TWO) TIMES DAILY. 180 capsule 3  . ergocalciferol (VITAMIN D2) 50000 UNITS capsule Take 50,000 Units by mouth once a week. Take on Fridays    . Febuxostat (ULORIC) 80 MG TABS Take 80 mg by mouth at bedtime.     . fesoterodine (TOVIAZ) 4 MG TB24 Take 4 mg by mouth daily.      [name]  fish oil-omega-3 fatty acids 1000 MG capsule Take 1 g by mouth 2 (two) times daily.     [name] gabapentin (NEURONTIN) 100 MG capsule TAKE ONE CAPSULE BY MOUTH EVERY MORNING AND 2 CAPS IN THE AFTERNOON AND 2 CAPS AT BEDTIME (Patient taking differently: Take 100 mg by mouth 4 (four) times daily. TAKE ONE CAPSULE BY MOUTH EVERY MORNING AND 1 CAPS AT LUNCH TIME, 1 AT [facility] AND 2 CAPS AT BEDTIME) 480 capsule 3  . guaiFENesin (MUCINEX) 600 MG 12 hr tablet Take 600 mg by mouth 2 (two) times daily.     [name] HYDROcodone-acetaminophen (NORCO/VICODIN) 5-325 MG per tablet Take 1 tablet by mouth every 6 (six) hours as needed for moderate pain.     . hydrOXYzine (ATARAX/VISTARIL) 10 MG tablet Take 1 tablet (10 mg total) by mouth every 8 (eight) hours as needed for itching. 30 tablet 0  . insulin regular human CONCENTRATED (HUMULIN R) 500  UNIT/ML SOLN injection Inject 28 Units into the skin 2 (two) times daily before a meal.     . ipratropium (ATROVENT) 0.02 % nebulizer solution Take 0.5 mg by nebulization 4 (four) times daily as needed for wheezing or shortness of breath.    . Liraglutide (VICTOZA) 18 MG/3ML SOLN injection Inject 1.8 mg into the skin daily.    [name] losartan (COZAAR) 100 MG tablet Take 1 tablet (177m) by mouth daily. 90 tablet 3  . meclizine (ANTIVERT) 25 MG tablet Take 25 mg by mouth at bedtime.     . metolazone (ZAROXOLYN) 5 MG tablet Take 1 tablet (5 mg total) by mouth See admin instructions. 3 times weekly as needed for edema 15 tablet 5  . Multiple Vitamin (MULTIVITAMIN) capsule Take 1 capsule by mouth daily.      . nebivolol (BYSTOLIC) 10 MG tablet Take 10 mg by mouth daily.    .Marland Kitchenomeprazole (PRILOSEC) 40 MG capsule Take 40 mg by mouth 2 (two) times daily.     . Oritavancin Diphosphate 1,200 mg in dextrose 5 % 880 mL Inject 1,200 mg into the vein once. 1 Units 0  . potassium chloride SA (K-DUR,KLOR-CON) 20 MEQ tablet Take 20 mEq by mouth 4 (four) times daily. 1 in AM, 2 at lunch, 1 at supper and 2 at bedtime    . primidone (MYSOLINE) 50 MG tablet Take 1 tablet (50 mg total) by mouth 2 (two) times daily. 180 tablet 3  . Probiotic Product (PROBIOTIC PO) Take 1 tablet by mouth daily.     . promethazine (PHENERGAN) 25 MG tablet Take 25 mg by mouth every 6 (six) hours as needed for nausea or vomiting.    . warfarin (COUMADIN) 5 MG tablet Take 5 mg by mouth daily at 6 PM. Take 5 mg on Monday, Tuesday, Wednesday, Thursday, Friday, and Saturday. Take 2.572m(half a tablet) on Sunday.     No current facility-administered medications for this visit.   Past Medical History  Diagnosis Date  . OSA (obstructive sleep apnea)   . Morbid obesity   . Depressive disorder, not elsewhere classified   . Diabetes mellitus   . Asthma   . Dyslipidemia   . Clostridium difficile enterocolitis 2012  . Splenomegaly 06/01/2011  .  Thrombocytopenia 06/01/2011  . Hepatosplenomegaly   . Gout   . GERD (gastroesophageal reflux disease)   . Osteoarthritis   . Polyneuropathy   . Urinary incontinence   . Amenorrhea   . CHF (congestive heart failure)   . Hyperlipidemia   . Leg  swelling   . Nausea   . Weakness   . Palpitations   . PONV (postoperative nausea and vomiting)   . Anginal pain     no chest pain in years  . Dysrhythmia     heart palpations  . Anxiety   . Pneumonia   . Shortness of breath   . Chronic kidney disease     overactive bladder  . Peripheral neuropathy   . DVT (deep venous thrombosis)     Family History  Problem Relation Age of Onset  . Diabetes Father   . Hypertension Father   . Dementia Father     Deceased, 53  . Pneumonia Father   . Heart disease Mother   . Clotting disorder Mother   . Hypertension Mother   . Heart attack Mother     Deceased, 74  . Multiple myeloma Paternal Grandmother   . Cancer Paternal Grandmother     multiple myoloma  . Heart disease Paternal Grandfather   . Kidney disease Maternal Grandmother   . Hypertension Sister   . Cancer Paternal Aunt     breast  . Cancer Cousin     breast - all three    History   Social History  . Marital Status: Single    Spouse Name: N/A  . Number of Children: 0  . Years of Education: N/A   Occupational History  . disabled    Social History Main Topics  . Smoking status: Never Smoker   . Smokeless tobacco: Never Used  . Alcohol Use: No     Comment: occasional - once or twice a year  . Drug Use: No  . Sexual Activity: Yes    Birth Control/ Protection: Surgical   Other Topics Concern  . Not on file   Social History Narrative   Lives alone.  She has been on disability since 1999 for morbid obesity.    Single, no children.   She has a caregiver that comes 7-days a week, ~5 hours each day.    Past Surgical History  Procedure Laterality Date  . Cholecystectomy    . Tracheostomy  2001  . Endometrial ablation      . Skin graft    . Finger surgery      ring finger on right hand  . Esophageal dilation  2003  . Breast biopsy      needle core, left  . Dilation and curettage of uterus      times 2  . Tracheal dilitation  01/24/2012    Procedure: TRACHEAL DILITATION;  Surgeon: Melissa Montane, MD;  Location: [place];  Service: ENT;  Laterality: N/A;  Trache change with possible dilation, from size 6 to size 7 uncuffed  . Amputation Right 11/19/2013    Procedure: AMPUTATION DIGIT;  Surgeon: Cammie Sickle, MD;  Location: [place];  Service: Orthopedics;  Laterality: Right;  Partial amputation right long finger, Debridement right ring finger   . Amputation Right 06/03/2014    Procedure: REVISION AMPUTATION RIGHT RING FINGER;  Surgeon: Daryll Brod, MD;  Location: [place];  Service: Orthopedics;  Laterality: Right;      (Not in a hospital admission)  Review of systems complete and found to be negative unless listed above .  No nausea, vomiting.  No fever chills, No focal weakness,  No palpitations.  Physical Exam: Filed Vitals:   09/07/14 0958  BP: 118/74  Pulse: 58    Weight: (!) 419 lb 12.8  oz (190.42 kg)  Physical exam:  [place]/AT, orbidly obese EOMI; trach No JVD, No carotid bruit RRR S1S2  No wheezing Soft. NT, nondistended Bilateral pitting edema L>R, discoloration, tips of fingers on right hand with abrasions and amputations. No focal motor or sensory deficits Normal affect  Labs:   Lab Results  Component Value Date   WBC 5.6 07/15/2014   HGB 12.8 07/15/2014   HCT 37.6 07/15/2014   MCV 91.3 07/15/2014   PLT 96* 07/15/2014   No results for input(s): NA, K, CL, CO2, BUN, CREATININE, CALCIUM, PROT, BILITOT, ALKPHOS, ALT, AST, GLUCOSE in the last 168 hours.  Invalid input(s): LABALBU Lab Results  Component Value Date   CKTOTAL 144 07/12/2014   CKMB 1.3 02/16/2009   TROPONINI 0.02        NO INDICATION OF MYOCARDIAL INJURY. 02/16/2009    Lab Results   Component Value Date   CHOL  02/15/2009    138        ATP III CLASSIFICATION:  <200     mg/dL   Desirable  200-239  mg/dL   Borderline High  >=240    mg/dL   High          CHOL  02/07/2007    107        ATP III CLASSIFICATION:  <200     mg/dL   Desirable  200-239  mg/dL   Borderline High  >=240    mg/dL   High   Lab Results  Component Value Date   HDL 28* 02/15/2009   HDL 20* 02/07/2007   Lab Results  Component Value Date   LDLCALC  02/15/2009    41        Total Cholesterol/HDL:CHD Risk Coronary Heart Disease Risk Table                     Men   Women  1/2 Average Risk   3.4   3.3  Average Risk       5.0   4.4  2 X Average Risk   9.6   7.1  3 X Average Risk  23.4   11.0        Use the calculated Patient Ratio above and the CHD Risk Table to determine the patient's CHD Risk.        ATP III CLASSIFICATION (LDL):  <100     mg/dL   Optimal  100-129  mg/dL   Near or Above                    Optimal  130-159  mg/dL   Borderline  160-189  mg/dL   High  >190     mg/dL   Very High   LDLCALC  02/07/2007    44        Total Cholesterol/HDL:CHD Risk Coronary Heart Disease Risk Table                     Men   Women  1/2 Average Risk   3.4   3.3   Lab Results  Component Value Date   TRIG 343* 02/15/2009   TRIG 216* 02/07/2007   Lab Results  Component Value Date   CHOLHDL 4.9 02/15/2009   CHOLHDL 5.4 02/07/2007   No results found for: LDLDIRECT     EKG: 2013: PRWP; todAy NSR, NSST changes  ASSESSMENT AND PLAN:  Diastolic heart failure: She feels that she is euvolemic at this time. Continue  current dose of diuretics. Creatinine was last checked at the end of January, 2016 and was stable for her per her report.    DVT: On chronic warfarin. She does not need aspirin. We stopped it in 2015.  HTN: Controlled today. Target blood pressure less than 130/80.  Hyperlipidemia: Continue Lipitor. Last HDL was 36. LDL 46. Triglycerides 299 in January 2015.  Triglycerides  are high. Consider increasing dose of fish oil to 2 g twice a day.  Will obtain most recent results.  May need to increase to 4 grams /daily.  Obesity: She is considering weight loss surgery.  Will have to figure out a practical way to evaluate her from a cardiac standpoint if the need arises.  She is having her eval at [facility].   Signed:   Mina Marble, MD, [facility] 09/07/2014, 10:08 AM

## 2014-09-07 NOTE — Telephone Encounter (Signed)
Lab work sent over from [facility] [place], Dr. Delene Ruffini office. Dr. Irish Lack reviewed labs and ordered for pt to increase fish oil to 2 tabs twice daily. Pt stated that she was taking 1200mg  twice daily vs the 1000mg  twice daily in the computer. Dr. Irish Lack informed and still would like to increase to two capsules twice daily. Pt informed. Pt verbalized understanding and was in agreement with this plan.

## 2014-09-10 ENCOUNTER — Other Ambulatory Visit: Payer: Self-pay | Admitting: Interventional Cardiology

## 2014-09-10 ENCOUNTER — Other Ambulatory Visit: Payer: Self-pay | Admitting: Neurology

## 2014-09-13 NOTE — Telephone Encounter (Signed)
OK to refill.  She has f/u with me this week.

## 2014-09-13 NOTE — Telephone Encounter (Signed)
Patient was to follow up 6 months from 11/2013. Please advise on refill request.

## 2014-09-16 ENCOUNTER — Encounter: Payer: Self-pay | Admitting: Neurology

## 2014-09-16 ENCOUNTER — Ambulatory Visit (INDEPENDENT_AMBULATORY_CARE_PROVIDER_SITE_OTHER): Payer: Medicare Other | Admitting: Neurology

## 2014-09-16 VITALS — BP 130/70 | HR 66

## 2014-09-16 DIAGNOSIS — E0849 Diabetes mellitus due to underlying condition with other diabetic neurological complication: Secondary | ICD-10-CM

## 2014-09-16 DIAGNOSIS — G250 Essential tremor: Secondary | ICD-10-CM

## 2014-09-16 DIAGNOSIS — E1342 Other specified diabetes mellitus with diabetic polyneuropathy: Secondary | ICD-10-CM

## 2014-09-16 DIAGNOSIS — E1142 Type 2 diabetes mellitus with diabetic polyneuropathy: Secondary | ICD-10-CM

## 2014-09-16 DIAGNOSIS — G629 Polyneuropathy, unspecified: Secondary | ICD-10-CM

## 2014-09-16 NOTE — Progress Notes (Signed)
Follow-up Visit   Date: 09/16/2014    [name] MRN: [id number] DOB: 1961/02/15   Interim History: [name] is a 54 y.o. 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  The patient was accompanied to the clinic by caregiver Marcy Salvo) who also provides collateral information.    History of present illness: She was previously a patient of Dr. Erling Cruz, whom she has seen for tremors and peripheral neuropathy for at least the past 10-15 years. She first started having heaviness of her leg left with spells of numbness/tingling since the early 1990s. Her numbness/tingling involves the areas distal to the left knee and right foot. She has more numbness and heaviness than tingling. She does not have burning sensation. She takes Norco which sometimes helps the pain. Last winter (December 2013 - April 2014), she fell six times, but has not fallen since then. She has been ambulating with a rollator since 2012. She started using a cane in 2009 and prior to that was walking independently. When she initially presented to me, she was taking Cymbalta 30mg  BID, neurontin 100mg  QID (9am, noon, 5pm, 10pm), topamax 50mg  daily (remote history of kidney stones), and fish oil. She tried neurontin 600mg , but became very groggy and sleepy. She has tried Lyrica, but developed joint pain and vision problems. She feels the most benefit from Cymbalta because it helps with depression. She reports having decreased sweating, early satiety, dry eyes and dry mouth.   Also about 10+ years ago, she started having tremors of her hands, worse when she is trying to use  her hands. Symptoms have been present for at least the past 10 years. She is currently taking Mysoline 50mg  0.5 tablet in the morning and 1.5 tablet in the evening.  She was evaluated by Dr. Cyndy Freeze for neck pain in 2007 and found not to be a surgical candidate. She has steroid injections in the past.   She has a caregiver that comes 7-days a week for ~5 hours. She has been on disability due to morbid obesity since 1999.  - Follow-up 12/11/2013:  At her last visit, her neurontin was increased to 100/200/200 (9am, 2pm 7pm) which tends to keep her pain adequately controlled.  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  Tremors are much improved since started primidone 50 mg twice daily.   - Follow-up 09/16/2014:  She was able to wean herself off topamax and did not notice any worsening pain.  She remains on neurontin 100/200/200 and cymbalta 90mg .  Tremor and neuropathic pain is mostly well-controlled but she can have bad days every now and then. She is able to walk with a walker around the home and uses a wheelchair only for long distances.  No interval falls and she has no new neurological complaints.  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  Her diabetes is stable.      Medications:  Current Outpatient Prescriptions on File Prior to Visit  Medication Sig Dispense Refill  . acetaminophen (TYLENOL)  500 MG tablet Take 1,000 mg by mouth every 6 (six) hours as needed for mild pain. For pain    . albuterol (PROVENTIL) (2.5 MG/3ML) 0.083% nebulizer solution Take 2.5 mg by nebulization every 4 (four) hours as needed. For shortness of breath    . ALPRAZolam (XANAX) 0.5 MG tablet Take 0.5 mg by mouth 2 (two) times daily as needed. For anxiety    . amoxicillin (AMOXIL) 875 MG  tablet Take 875 mg by mouth 2 (two) times daily.  0  . atorvastatin (LIPITOR) 10 MG tablet Take 10 mg by mouth at bedtime.     . B Complex Vitamins (VITAMIN B COMPLEX PO) Take 1 tablet by mouth daily.     . budesonide (PULMICORT) 0.25 MG/2ML nebulizer solution Take 0.25 mg by nebulization 2 (two) times daily as needed (shortness of breath).    . bumetanide (BUMEX) 2 MG tablet Take 1 tablet (2 mg total) by mouth daily. 90 tablet 3  . busPIRone (BUSPAR) 5 MG tablet Take 7.5 mg by mouth 2 (two) times daily.     [name] BYSTOLIC 10 MG tablet TAKE 1 TABLET (10 MG TOTAL) BY MOUTH DAILY. 90 tablet 3  . Canagliflozin (INVOKANA) 100 MG TABS Take 100 mg by mouth daily.     . cholestyramine (QUESTRAN) 4 G packet Take 1 packet by mouth as needed. For loose stools, IBS.    . clotrimazole (LOTRIMIN) 1 % cream Apply 1 application topically daily. To stomach    . colchicine 0.6 MG tablet Take 0.6 mg by mouth daily.    . DULoxetine (CYMBALTA) 30 MG capsule TAKE 1 CAPSULE (30 MG TOTAL) BY MOUTH 2 (TWO) TIMES DAILY. 180 capsule 3  . ergocalciferol (VITAMIN D2) 50000 UNITS capsule Take 50,000 Units by mouth once a week. Take on Fridays    . Febuxostat (ULORIC) 80 MG TABS Take 80 mg by mouth at bedtime.     . fesoterodine (TOVIAZ) 4 MG TB24 Take 4 mg by mouth daily.      [name] gabapentin (NEURONTIN) 100 MG capsule TAKE ONE CAPSULE BY MOUTH EVERY MORNING AND 2 CAPS IN THE AFTERNOON AND 2 CAPS AT BEDTIME (Patient taking differently: Take 100 mg by mouth 4 (four) times daily. TAKE ONE CAPSULE BY MOUTH EVERY MORNING AND 1 CAPS AT LUNCH TIME, 1 AT [facility] AND 2 CAPS AT BEDTIME) 480 capsule 3  . gabapentin (NEURONTIN) 100 MG capsule TAKE ONE CAPSULE BY MOUTH EVERY MORNING AND 2 CAPS IN THE AFTERNOON AND 2 CAPS AT BEDTIME 480 capsule 3  . guaiFENesin (MUCINEX) 600 MG 12 hr tablet Take 600 mg by mouth 2 (two) times daily.     [name] HYDROcodone-acetaminophen (NORCO/VICODIN) 5-325 MG per tablet Take 1 tablet by mouth every 6 (six) hours as  needed for moderate pain.     [name] insulin regular human CONCENTRATED (HUMULIN R) 500 UNIT/ML SOLN injection Inject 28 Units into the skin 2 (two) times daily before a meal.     . ipratropium (ATROVENT) 0.02 % nebulizer solution Take 0.5 mg by nebulization 4 (four) times daily as needed for wheezing or shortness of breath.    . Liraglutide (VICTOZA) 18 MG/3ML SOLN injection Inject 1.8 mg into the skin daily.    [name] losartan (COZAAR) 100 MG tablet Take 1 tablet (100mg ) by mouth daily. 90 tablet 3  . meclizine (ANTIVERT) 25 MG tablet Take 25 mg by mouth at bedtime.     . metolazone (ZAROXOLYN) 5 MG tablet TAKE 1 TABLET (5 MG  TOTAL) BY MOUTH SEE ADMIN INSTRUCTIONS. 3 TIMES WEEKLY AS NEEDED FOR EDEMA 15 tablet 3  . Multiple Vitamin (MULTIVITAMIN) capsule Take 1 capsule by mouth daily.      . Omega-3 Fatty Acids (OMEGA-3 FISH OIL) 1200 MG CAPS Take 2 capsules (2,400 mg total) by mouth 2 (two) times daily. 120 capsule 6  . omeprazole (PRILOSEC) 40 MG capsule Take 40 mg by mouth 2 (two) times daily.     . Oritavancin Diphosphate 1,200 mg in dextrose 5 % 880 mL Inject 1,200 mg into the vein once. 1 Units 0  . potassium chloride SA (K-DUR,KLOR-CON) 20 MEQ tablet Take 20 mEq by mouth 4 (four) times daily. 1 in AM, 2 at lunch, 1 at supper and 2 at bedtime    . primidone (MYSOLINE) 50 MG tablet Take 1 tablet (50 mg total) by mouth 2 (two) times daily. 180 tablet 3  . Probiotic Product (PROBIOTIC PO) Take 1 tablet by mouth daily.     . promethazine (PHENERGAN) 25 MG tablet Take 25 mg by mouth every 6 (six) hours as needed for nausea or vomiting.    . warfarin (COUMADIN) 5 MG tablet Take 5 mg by mouth daily at 6 PM. Take 5 mg on Monday, Tuesday, Wednesday, Thursday, Friday, and Saturday. Take 2.5mg  (half a tablet) on Sunday.     No current facility-administered medications on file prior to visit.    Allergies:  Allergies  Allergen Reactions  . Codeine Other (See Comments)    Heart problems  . Molds & Smuts  Shortness Of Breath and Rash  . Allopurinol Other (See Comments)    GI problems   . Ciprofloxacin Other (See Comments)    GI problems  . Doxycycline Other (See Comments)    Gastric problems  . Fexofenadine Other (See Comments)    Hurts joints/pain  . Keflex [Cephalexin] Diarrhea and Nausea Only  . Nitroglycerin Er Nausea And Vomiting  . Oritavancin Itching    Mild-moderate itching.   . Sulfa Drugs Cross Reactors Nausea And Vomiting  . Sulfonamide Derivatives Nausea Only  . Accolate [Zafirlukast] Other (See Comments)    Reaction unknown  . Amoxicillin-Pot Clavulanate Other (See Comments)    Other reaction(s): Unknown  . Morphine Other (See Comments)    unknown  . Nystatin Rash  . Septra [Sulfamethoxazole-Trimethoprim] Other (See Comments)    unknown     Review of Systems:  CONSTITUTIONAL: No fevers, chills, night sweats, or weight loss.  + Generalized tiredness EYES: No visual changes or eye pain ENT: No hearing changes.  No history of nose bleeds.   RESPIRATORY: No cough, wheezing and shortness of breath.   CARDIOVASCULAR: Negative for chest pain, and palpitations.   GI: Negative for abdominal discomfort, blood in stools or black stools.  No recent change in bowel habits.   GU:  No history of incontinence.   MUSCLOSKELETAL: + history of joint pain or swelling.  No myalgias.   SKIN: Negative for lesions, rash, and itching.   ENDOCRINE: Negative for cold or heat intolerance, polydipsia or goiter.   PSYCH:  + depression or anxiety symptoms.   NEURO: As Above.   Vital Signs:  BP 130/70 mmHg  Pulse 66  Ht   Wt   SpO2 99%  LMP 05/17/2014 (Exact Date)  Neurological Exam: MENTAL STATUS including orientation to time, place, person, recent and remote memory, attention span and concentration, language, and fund of knowledge is normal.  Speech is not dysarthric.  CRANIAL NERVES:  Pupils  equal round and reactive to light.  Face is symmetric.  MOTOR: No tremor on today's  exam.   Right Upper Extremity:    Left Upper Extremity:    Deltoid  5/5   Deltoid  5/5   Biceps  5/5   Biceps  5/5   Triceps  5/5   Triceps  5/5   Wrist extensors  5/5   Wrist extensors  5/5   Wrist flexors  5/5   Wrist flexors  5/5   Finger extensors  5/5   Finger extensors  5/5   Finger flexors  5/5   Finger flexors  5/5   Dorsal interossei  4/5   Dorsal interossei  4/5   Abductor pollicis  5/5   Abductor pollicis  5/5   Tone (Ashworth scale)  0   Tone (Ashworth scale)  0    Right Lower Extremity:    Left Lower Extremity:    Hip flexors  4+/5   Hip flexors  4+/5   Hip extensors  5/5   Hip extensors  5/5   Knee flexors  5/5   Knee flexors  5/5   Knee extensors  5/5   Knee extensors  5/5   Dorsiflexors  4-/5   Dorsiflexors  4-/5   Plantarflexors  4/5   Plantarflexors  4/5   Toe extensors  2/5   Toe extensors  2/5   Toe flexors  2/5   Toe flexors  2/5   Tone (Ashworth scale)  0   Tone (Ashworth scale)  0    MSRs:  Right      Left  brachioradialis  2+   brachioradialis  2+   biceps  1+   biceps  1+   triceps  1+   triceps  1+   patellar  0   Patellar  0   ankle jerk  0   ankle jerk  0    SENSORY: All sensory modalities are reduced in gradient pattern distal to the knees and mild changes in the hands.   GAIT: Not assessed, wheelchair-bound  Data: Lab Results  Component Value Date   HGBA1C 7.2* 07/11/2014   Lab Results  Component Value Date   TSH 1.330 06/11/2014     IMPRESSION: 1. Length-dependent generalized sensorimotor peripheral neuropathy due to diabetes, clinically stable  - Predominately large fiber > small fiber, at the levels of the knees with mild sensory loss in the hands also and bilateral foot drop   - Currently taking neurontin 100/200/200, cymbalta 60mg /d. Previously tried Lyrica (vision changes, swelling), TPM.  - Given her cardiac co-morbidities and morbid obesity, she is not a good candidate for TCAs.   - Encouraged patient to continue tight  glycemic control 2. Benign essential tremors   -  Well-controlled on primidone 100mg /d   -  Not a candidate for propranolol due to history of asthma  3. 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 Depression  5. HTN  6. Asthma  7. Gout  8. DVT on coumadin 9. Stage III CKD    PLAN/RECOMMENDATIONS:  1.  Continue neurontin 100mg  in the morning, 200mg  in the afternoon, 200mg  at bedtime 3.  Continue Cymbalta 30mg  twice daily - refills sent 4.  Continue Mysoline 50mg  twice daily 5.  Return to clinic in 1 year   The duration of this appointment visit was 25 minutes of face-to-face time with the patient.  Greater  than 50% of this time was spent in counseling, explanation of diagnosis, planning of further management, and coordination of care.   Thank you for allowing me to participate in patient's care.  If I can answer any additional questions, I would be pleased to do so.    Sincerely,    Audrianna Driskill K. Posey Pronto, DO

## 2014-09-16 NOTE — Patient Instructions (Signed)
Continue your medications as you are taking them  

## 2014-09-20 ENCOUNTER — Emergency Department (HOSPITAL_COMMUNITY)
Admission: EM | Admit: 2014-09-20 | Discharge: 2014-09-20 | Disposition: A | Discharge status: Home / Self Care | Payer: Medicare Other | Attending: Emergency Medicine | Admitting: Emergency Medicine

## 2014-09-20 ENCOUNTER — Encounter (HOSPITAL_COMMUNITY): Payer: Self-pay | Admitting: Emergency Medicine

## 2014-09-20 ENCOUNTER — Emergency Department (HOSPITAL_COMMUNITY): Payer: Medicare Other

## 2014-09-20 DIAGNOSIS — Z7901 Long term (current) use of anticoagulants: Secondary | ICD-10-CM | POA: Diagnosis not present

## 2014-09-20 DIAGNOSIS — Z430 Encounter for attention to tracheostomy: Secondary | ICD-10-CM

## 2014-09-20 DIAGNOSIS — R319 Hematuria, unspecified: Secondary | ICD-10-CM | POA: Insufficient documentation

## 2014-09-20 DIAGNOSIS — Z880 Allergy status to penicillin: Secondary | ICD-10-CM | POA: Insufficient documentation

## 2014-09-20 DIAGNOSIS — J45909 Unspecified asthma, uncomplicated: Secondary | ICD-10-CM | POA: Insufficient documentation

## 2014-09-20 DIAGNOSIS — Z86718 Personal history of other venous thrombosis and embolism: Secondary | ICD-10-CM | POA: Insufficient documentation

## 2014-09-20 DIAGNOSIS — F329 Major depressive disorder, single episode, unspecified: Secondary | ICD-10-CM | POA: Insufficient documentation

## 2014-09-20 DIAGNOSIS — N189 Chronic kidney disease, unspecified: Secondary | ICD-10-CM | POA: Diagnosis not present

## 2014-09-20 DIAGNOSIS — Z792 Long term (current) use of antibiotics: Secondary | ICD-10-CM | POA: Diagnosis not present

## 2014-09-20 DIAGNOSIS — Z9889 Other specified postprocedural states: Secondary | ICD-10-CM | POA: Insufficient documentation

## 2014-09-20 DIAGNOSIS — Z9049 Acquired absence of other specified parts of digestive tract: Secondary | ICD-10-CM | POA: Diagnosis not present

## 2014-09-20 DIAGNOSIS — I509 Heart failure, unspecified: Secondary | ICD-10-CM | POA: Insufficient documentation

## 2014-09-20 DIAGNOSIS — Z794 Long term (current) use of insulin: Secondary | ICD-10-CM | POA: Diagnosis not present

## 2014-09-20 DIAGNOSIS — E119 Type 2 diabetes mellitus without complications: Secondary | ICD-10-CM | POA: Insufficient documentation

## 2014-09-20 DIAGNOSIS — E785 Hyperlipidemia, unspecified: Secondary | ICD-10-CM | POA: Insufficient documentation

## 2014-09-20 DIAGNOSIS — Z008 Encounter for other general examination: Secondary | ICD-10-CM

## 2014-09-20 LAB — BASIC METABOLIC PANEL
Anion gap: 12 (ref 5–15)
BUN: 18 mg/dL (ref 6–23)
CO2: 29 mmol/L (ref 19–32)
CREATININE: 1.21 mg/dL — AB (ref 0.50–1.10)
Calcium: 8.9 mg/dL (ref 8.4–10.5)
Chloride: 95 mmol/L — ABNORMAL LOW (ref 96–112)
GFR calc Af Amer: 59 mL/min — ABNORMAL LOW (ref >90)
GFR calc non Af Amer: 51 mL/min — ABNORMAL LOW (ref >90)
Glucose, Bld: 220 mg/dL — ABNORMAL HIGH (ref 70–99)
Potassium: 3.3 mmol/L — ABNORMAL LOW (ref 3.5–5.1)
SODIUM: 136 mmol/L (ref 135–145)

## 2014-09-20 LAB — CBC WITH DIFFERENTIAL/PLATELET
Basophils Absolute: 0 10*3/uL (ref 0.0–0.1)
Basophils Relative: 1 % (ref 0–1)
EOS ABS: 0.2 10*3/uL (ref 0.0–0.7)
Eosinophils Relative: 2 % (ref 0–5)
HCT: 40.2 % (ref 36.0–46.0)
Hemoglobin: 13.4 g/dL (ref 12.0–15.0)
LYMPHS ABS: 1.8 10*3/uL (ref 0.7–4.0)
Lymphocytes Relative: 25 % (ref 12–46)
MCH: 30.5 pg (ref 26.0–34.0)
MCHC: 33.3 g/dL (ref 30.0–36.0)
MCV: 91.6 fL (ref 78.0–100.0)
Monocytes Absolute: 1.1 10*3/uL — ABNORMAL HIGH (ref 0.1–1.0)
Monocytes Relative: 16 % — ABNORMAL HIGH (ref 3–12)
NEUTROS ABS: 4 10*3/uL (ref 1.7–7.7)
NEUTROS PCT: 56 % (ref 43–77)
Platelets: 125 10*3/uL — ABNORMAL LOW (ref 150–400)
RBC: 4.39 MIL/uL (ref 3.87–5.11)
RDW: 14.2 % (ref 11.5–15.5)
WBC: 7.1 10*3/uL (ref 4.0–10.5)

## 2014-09-20 LAB — URINALYSIS, ROUTINE W REFLEX MICROSCOPIC
Bilirubin Urine: NEGATIVE
Glucose, UA: >1000 mg/dL — AB
Ketones, ur: NEGATIVE mg/dL
Leukocytes, UA: NEGATIVE
Nitrite: NEGATIVE
PH: 6.5 (ref 5.0–8.0)
Protein, ur: NEGATIVE mg/dL
SPECIFIC GRAVITY, URINE: 1.022 (ref 1.005–1.030)
UROBILINOGEN UA: 0.2 mg/dL (ref 0.0–1.0)

## 2014-09-20 LAB — BRAIN NATRIURETIC PEPTIDE: B Natriuretic Peptide: 12.5 pg/mL (ref 0.0–100.0)

## 2014-09-20 LAB — PROTIME-INR
INR: 2.72 — AB (ref 0.00–1.49)
PROTHROMBIN TIME: 29 s — AB (ref 11.6–15.2)

## 2014-09-20 LAB — URINE MICROSCOPIC-ADD ON

## 2014-09-20 LAB — I-STAT TROPONIN, ED: TROPONIN I, POC: 0 ng/mL (ref 0.00–0.08)

## 2014-09-20 MED ORDER — HYDROCODONE-ACETAMINOPHEN 5-325 MG PO TABS
1.0000 | ORAL_TABLET | Freq: Four times a day (QID) | ORAL | Status: DC | PRN
  Administered 2014-09-20: 1 via ORAL
  Filled 2014-09-20: qty 1

## 2014-09-20 MED ORDER — GABAPENTIN 100 MG PO CAPS
100.0000 mg | ORAL_CAPSULE | Freq: Four times a day (QID) | ORAL | Status: DC
  Administered 2014-09-20: 100 mg via ORAL
  Filled 2014-09-20 (×3): qty 1

## 2014-09-20 NOTE — ED Notes (Signed)
Brought pt back to room via wheelchair; Judson Roch, RN and Lovena Le, NT assisted me with undressing pt, placing in gown, on monitor, continuous pulse oximetry and blood pressure cuff; RT in room deep suctioning pt per request; pt has a trach

## 2014-09-20 NOTE — ED Provider Notes (Signed)
Medical screening examination/treatment/procedure(s) were performed by non-physician practitioner and as supervising physician I was immediately available for consultation/collaboration.   EKG Interpretation   Date/Time:  Monday September 20 2014 11:41:59 EDT Ventricular Rate:  65 PR Interval:  154 QRS Duration: 116 QT Interval:  468 QTC Calculation: 487 R Axis:   32 Text Interpretation:  Sinus rhythm Nonspecific intraventricular conduction  delay Low voltage, precordial leads Confirmed by Rayan Ines  MD, Valen Gillison  ([id number]) on 09/20/2014 11:44:57 AM      Results for orders placed or performed during the hospital encounter of 09/20/14  CBC with Differential/Platelet  Result Value Ref Range   WBC 7.1 4.0 - 10.5 K/uL   RBC 4.39 3.87 - 5.11 MIL/uL   Hemoglobin 13.4 12.0 - 15.0 g/dL   HCT 40.2 36.0 - 46.0 %   MCV 91.6 78.0 - 100.0 fL   MCH 30.5 26.0 - 34.0 pg   MCHC 33.3 30.0 - 36.0 g/dL   RDW 14.2 11.5 - 15.5 %   Platelets 125 (L) 150 - 400 K/uL   Neutrophils Relative % 56 43 - 77 %   Neutro Abs 4.0 1.7 - 7.7 K/uL   Lymphocytes Relative 25 12 - 46 %   Lymphs Abs 1.8 0.7 - 4.0 K/uL   Monocytes Relative 16 (H) 3 - 12 %   Monocytes Absolute 1.1 (H) 0.1 - 1.0 K/uL   Eosinophils Relative 2 0 - 5 %   Eosinophils Absolute 0.2 0.0 - 0.7 K/uL   Basophils Relative 1 0 - 1 %   Basophils Absolute 0.0 0.0 - 0.1 K/uL  Basic metabolic panel  Result Value Ref Range   Sodium 136 135 - 145 mmol/L   Potassium 3.3 (L) 3.5 - 5.1 mmol/L   Chloride 95 (L) 96 - 112 mmol/L   CO2 29 19 - 32 mmol/L   Glucose, Bld 220 (H) 70 - 99 mg/dL   BUN 18 6 - 23 mg/dL   Creatinine, Ser 1.21 (H) 0.50 - 1.10 mg/dL   Calcium 8.9 8.4 - 10.5 mg/dL   GFR calc non Af Amer 51 (L) >90 mL/min   GFR calc Af Amer 59 (L) >90 mL/min   Anion gap 12 5 - 15  Urinalysis, Routine w reflex microscopic  Result Value Ref Range   Color, Urine YELLOW YELLOW   APPearance CLOUDY (A) CLEAR   Specific Gravity, Urine 1.022 1.005 - 1.030    pH 6.5 5.0 - 8.0   Glucose, UA >1000 (A) NEGATIVE mg/dL   Hgb urine dipstick MODERATE (A) NEGATIVE   Bilirubin Urine NEGATIVE NEGATIVE   Ketones, ur NEGATIVE NEGATIVE mg/dL   Protein, ur NEGATIVE NEGATIVE mg/dL   Urobilinogen, UA 0.2 0.0 - 1.0 mg/dL   Nitrite NEGATIVE NEGATIVE   Leukocytes, UA NEGATIVE NEGATIVE  Brain natriuretic peptide  Result Value Ref Range   B Natriuretic Peptide 12.5 0.0 - 100.0 pg/mL  Protime-INR  Result Value Ref Range   Prothrombin Time 29.0 (H) 11.6 - 15.2 seconds   INR 2.72 (H) 0.00 - 1.49  Urine microscopic-add on  Result Value Ref Range   Squamous Epithelial / LPF RARE RARE   RBC / HPF 3-6 <3 RBC/hpf  I-stat troponin, ED  Result Value Ref Range   Troponin i, poc 0.00 0.00 - 0.08 ng/mL   Comment 3           Results for orders placed or performed during the hospital encounter of 09/20/14  CBC with Differential/Platelet  Result Value Ref Range  WBC 7.1 4.0 - 10.5 K/uL   RBC 4.39 3.87 - 5.11 MIL/uL   Hemoglobin 13.4 12.0 - 15.0 g/dL   HCT 40.2 36.0 - 46.0 %   MCV 91.6 78.0 - 100.0 fL   MCH 30.5 26.0 - 34.0 pg   MCHC 33.3 30.0 - 36.0 g/dL   RDW 14.2 11.5 - 15.5 %   Platelets 125 (L) 150 - 400 K/uL   Neutrophils Relative % 56 43 - 77 %   Neutro Abs 4.0 1.7 - 7.7 K/uL   Lymphocytes Relative 25 12 - 46 %   Lymphs Abs 1.8 0.7 - 4.0 K/uL   Monocytes Relative 16 (H) 3 - 12 %   Monocytes Absolute 1.1 (H) 0.1 - 1.0 K/uL   Eosinophils Relative 2 0 - 5 %   Eosinophils Absolute 0.2 0.0 - 0.7 K/uL   Basophils Relative 1 0 - 1 %   Basophils Absolute 0.0 0.0 - 0.1 K/uL  Basic metabolic panel  Result Value Ref Range   Sodium 136 135 - 145 mmol/L   Potassium 3.3 (L) 3.5 - 5.1 mmol/L   Chloride 95 (L) 96 - 112 mmol/L   CO2 29 19 - 32 mmol/L   Glucose, Bld 220 (H) 70 - 99 mg/dL   BUN 18 6 - 23 mg/dL   Creatinine, Ser 1.21 (H) 0.50 - 1.10 mg/dL   Calcium 8.9 8.4 - 10.5 mg/dL   GFR calc non Af Amer 51 (L) >90 mL/min   GFR calc Af Amer 59 (L) >90 mL/min    Anion gap 12 5 - 15  Urinalysis, Routine w reflex microscopic  Result Value Ref Range   Color, Urine YELLOW YELLOW   APPearance CLOUDY (A) CLEAR   Specific Gravity, Urine 1.022 1.005 - 1.030   pH 6.5 5.0 - 8.0   Glucose, UA >1000 (A) NEGATIVE mg/dL   Hgb urine dipstick MODERATE (A) NEGATIVE   Bilirubin Urine NEGATIVE NEGATIVE   Ketones, ur NEGATIVE NEGATIVE mg/dL   Protein, ur NEGATIVE NEGATIVE mg/dL   Urobilinogen, UA 0.2 0.0 - 1.0 mg/dL   Nitrite NEGATIVE NEGATIVE   Leukocytes, UA NEGATIVE NEGATIVE  Brain natriuretic peptide  Result Value Ref Range   B Natriuretic Peptide 12.5 0.0 - 100.0 pg/mL  Protime-INR  Result Value Ref Range   Prothrombin Time 29.0 (H) 11.6 - 15.2 seconds   INR 2.72 (H) 0.00 - 1.49  Urine microscopic-add on  Result Value Ref Range   Squamous Epithelial / LPF RARE RARE   RBC / HPF 3-6 <3 RBC/hpf  I-stat troponin, ED  Result Value Ref Range   Troponin i, poc 0.00 0.00 - 0.08 ng/mL   Comment 3           Dg Chest 2 View  09/20/2014   CLINICAL DATA:  Blood in the urine. Alert and oriented. No distress. Evaluate tracheostomy tube.  EXAM: CHEST  2 VIEW  COMPARISON:  07/11/2014  FINDINGS: There is a tracheostomy tube with the tip 4.6 cm above the carina in satisfactory position. There is no focal parenchymal opacity, pleural effusion, or pneumothorax. Stable cardiomegaly.  The osseous structures are unremarkable.  IMPRESSION: No active cardiopulmonary disease.   Electronically Signed   By: Kathreen Devoid   On: 09/20/2014 11:05    Patient here with concern about the blood in the urine. Workup here without distinct evidence of urinary tract infection. Urine culture sent. I tried was negative this time. Rest of patient's workup without significant findings. Patient also  with a complaint of some skin itching has been using her Claritin without help. Her primary care doctor gave her a shot of the Decadron in the past. We recommend she contact her primary care doctor  for further considerations on the prednisone which could have significant health effects for her. Patient stable for discharge home.  Skin without any particularly unusual rash. Also had respiratory suction her trach. Room air sats are fine lungs with some with some rhonchi.  Fredia Sorrow, MD 09/20/14 [phone]

## 2014-09-20 NOTE — ED Notes (Signed)
Pt stable, pain decreasing 9/10, PTAR called at pt's request

## 2014-09-20 NOTE — ED Notes (Signed)
Pt able to use bedside toilet, did not need to in and out cath

## 2014-09-20 NOTE — ED Notes (Signed)
PT requires PTAR. Secretary calling.

## 2014-09-20 NOTE — Discharge Instructions (Signed)
Care of a Tracheostomy Tube Keeping the tracheostomy tube clean helps prevent infections and keeps certain trach tubing from plugging. A tracheostomy tube is commonly known as a trach tube. You may have one tube (an outer cannula), or you may have two tubes (an outer and inner cannula). The inner cannula that fits inside the outer cannula is removed for cleaning or replacement. Follow your caregiver's directions as to how often you should change and clean your trach tube.  SUPPLIES NEEDED  Towel.  Suction supplies.  Sterile trach care kit.  4x4 inch (10x10 cm) gauze pads.  Sterile cotton-tipped swabs.  Sterile trach bandage (dressing).  Sterile container.  0.9% saline solution.  Small sterile brush (or disposable inner cannula).  Roll of twill tape, trach ties, or trach holder.  Scissors.  Clean gloves.  Sterile gloves. TRACHEOSTOMY CARE   Have all supplies ready and available.  Wash hands well.  Put on clean gloves.  Suction the trach tube as needed.  When suctioning is complete, remove soiled trach dressings, gloves, and suction catheter. Throw away the dressings and coiled catheter in the glove. To coil the catheter, roll the catheter around the fingers. Then, pull the glove off inside out so that catheter remains coiled in glove.  Wash hands well.  Put on sterile gloves.  Fill a container 0.9% saline solution.  Give oxygen as needed.  Clean the inner cannula. Nondisposable inner cannula.  While only touching the outer part of the trach tube, unlock and remove the inner cannula.  Drop the inner cannula into 0.9% saline solution. The saline will loosen secretions.  Replace the trach collar, trach tube, or ventilator oxygen source over the outer cannula. Do not attach the trach tube and ventilator oxygen devices to all outer cannulas when the inner cannula is removed.  Quickly pick up the inner cannula out of the saline solution. Use a small brush to remove  the secretions on the inside and outside of the inner cannula.  Hold the inner cannula over the container. Rinse the cannula with 0.9% saline solution.  Replace the inner cannula. Secure the locking mechanism.  Give oxygen as needed. Disposable inner cannula.  Remove the new cannula from the packaging.  Take out the inner cannula while touching only the outer part of the trach tube.  Replace the old cannula with the new cannula. Lock it into position.  Throw away the old cannula.  Give oxygen as needed.  Clean the outer cannula surfaces with gauze or cotton swabs, extending 2-4 inches (5-10 cm) in all directions under the neck plate. Using a cotton swab, clean the stoma site in a circular motion from the stoma site outward.  Dry the skin and the outer cannula by patting the area gently with a dry gauze pad.  Secure the trach tube with trach ties or a trach tube holder.  Place a sterile dressing around the trach site.  Give oxygen as needed.  Throw away any used supplies.  Remove gloves.  Wash hands well. Document Released: 07/24/2006 Document Revised: 05/28/2012 Document Reviewed: 01/18/2012 [facility] Patient Information 2015 [place], [place]. This information is not intended to replace advice given to you by your health care provider. Make sure you discuss any questions you have with your health care provider. Hematuria Hematuria is blood in your urine. It can be caused by a bladder infection, kidney infection, prostate infection, kidney stone, or cancer of your urinary tract. Infections can usually be treated with medicine, and a kidney stone  usually will pass through your urine. If neither of these is the cause of your hematuria, further workup to find out the reason may be needed. It is very important that you tell your health care provider about any blood you see in your urine, even if the blood stops without treatment or happens without causing pain. Blood in your urine that  happens and then stops and then happens again can be a symptom of a very serious condition. Also, pain is not a symptom in the initial stages of many urinary cancers. HOME CARE INSTRUCTIONS   Drink lots of fluid, 3-4 quarts a day. If you have been diagnosed with an infection, cranberry juice is especially recommended, in addition to large amounts of water.  Avoid caffeine, tea, and carbonated beverages because they tend to irritate the bladder.  Avoid alcohol because it may irritate the prostate.  Take all medicines as directed by your health care provider.  If you were prescribed an antibiotic medicine, finish it all even if you start to feel better.  If you have been diagnosed with a kidney stone, follow your health care provider's instructions regarding straining your urine to catch the stone.  Empty your bladder often. Avoid holding urine for long periods of time.  After a bowel movement, women should cleanse front to back. Use each tissue only once.  Empty your bladder before and after sexual intercourse if you are a female. SEEK MEDICAL CARE IF:  You develop back pain.  You have a fever.  You have a feeling of sickness in your stomach (nausea) or vomiting.  Your symptoms are not better in 3 days. Return sooner if you are getting worse. SEEK IMMEDIATE MEDICAL CARE IF:   You develop severe vomiting and are unable to keep the medicine down.  You develop severe back or abdominal pain despite taking your medicines.  You begin passing a large amount of blood or clots in your urine.  You feel extremely weak or faint, or you pass out. MAKE SURE YOU:   Understand these instructions.  Will watch your condition.  Will get help right away if you are not doing well or get worse. Document Released: 06/11/2005 Document Revised: 10/26/2013 Document Reviewed: 02/09/2013 [facility] Patient Information 2015 [place], [place]. This information is not intended to replace advice given to  you by your health care provider. Make sure you discuss any questions you have with your health care provider.

## 2014-09-20 NOTE — ED Provider Notes (Signed)
CSN: [id number]     Arrival date & time 09/20/14  1002 History   First MD Initiated Contact with Patient 09/20/14 1026     Chief Complaint  Patient presents with  . Hematuria     (Consider location/radiation/quality/duration/timing/severity/associated sxs/prior Treatment) HPI Comments: Patient with multiple comorbidities including morbid obesity and pickwickian syndrome requiring tracheostomy presents to the emergency department with chief complaint of generalized fatigue, weakness, and hematuria. Patient is ventilator dependent at night. She denies any fevers, but states that she has had some chills and has felt fatigued. There are no aggravating or alleviating factors. She has not taken anything to alleviate her symptoms.  The history is provided by the patient. No language interpreter was used.    Past Medical History  Diagnosis Date  . OSA (obstructive sleep apnea)   . Morbid obesity   . Depressive disorder, not elsewhere classified   . Diabetes mellitus   . Asthma   . Dyslipidemia   . Clostridium difficile enterocolitis 2012  . Splenomegaly 06/01/2011  . Thrombocytopenia 06/01/2011  . Hepatosplenomegaly   . Gout   . GERD (gastroesophageal reflux disease)   . Osteoarthritis   . Polyneuropathy   . Urinary incontinence   . Amenorrhea   . CHF (congestive heart failure)   . Hyperlipidemia   . Leg swelling   . Nausea   . Weakness   . Palpitations   . PONV (postoperative nausea and vomiting)   . Anginal pain     no chest pain in years  . Dysrhythmia     heart palpations  . Anxiety   . Pneumonia   . Shortness of breath   . Chronic kidney disease     overactive bladder  . Peripheral neuropathy   . DVT (deep venous thrombosis)    Past Surgical History  Procedure Laterality Date  . Cholecystectomy    . Tracheostomy  2001  . Endometrial ablation    . Skin graft    . Finger surgery      ring finger on right hand  . Esophageal dilation  2003  . Breast biopsy       needle core, left  . Dilation and curettage of uterus      times 2  . Tracheal dilitation  01/24/2012    Procedure: TRACHEAL DILITATION;  Surgeon: Melissa Montane, MD;  Location: [place];  Service: ENT;  Laterality: N/A;  Trache change with possible dilation, from size 6 to size 7 uncuffed  . Amputation Right 11/19/2013    Procedure: AMPUTATION DIGIT;  Surgeon: Cammie Sickle, MD;  Location: [place];  Service: Orthopedics;  Laterality: Right;  Partial amputation right long finger, Debridement right ring finger   . Amputation Right 06/03/2014    Procedure: REVISION AMPUTATION RIGHT RING FINGER;  Surgeon: Daryll Brod, MD;  Location: [place];  Service: Orthopedics;  Laterality: Right;   Family History  Problem Relation Age of Onset  . Diabetes Father   . Hypertension Father   . Dementia Father     Deceased, 56  . Pneumonia Father   . Heart disease Mother   . Clotting disorder Mother   . Hypertension Mother   . Heart attack Mother     Deceased, 73  . Multiple myeloma Paternal Grandmother   . Cancer Paternal Grandmother     multiple myoloma  . Heart disease Paternal Grandfather   . Kidney disease Maternal Grandmother   . Hypertension Sister   . Cancer  Paternal Aunt     breast  . Cancer Cousin     breast - all three   History  Substance Use Topics  . Smoking status: Never Smoker   . Smokeless tobacco: Never Used  . Alcohol Use: No     Comment: occasional - once or twice a year   OB History    Gravida Para Term Preterm AB TAB SAB Ectopic Multiple Living   0         0     Review of Systems  Constitutional: Positive for chills and fatigue. Negative for fever.  HENT: Positive for congestion.   Respiratory: Negative for shortness of breath.   Cardiovascular: Negative for chest pain.  Gastrointestinal: Negative for nausea, vomiting, diarrhea and constipation.  Genitourinary: Positive for hematuria. Negative for dysuria.  All other systems reviewed  and are negative.     Allergies  Codeine; Molds & smuts; Allopurinol; Ciprofloxacin; Doxycycline; Fexofenadine; Keflex; Nitroglycerin er; Oritavancin; Sulfa drugs cross reactors; Sulfonamide derivatives; Accolate; Amoxicillin-pot clavulanate; Morphine; Nystatin; and Septra  Home Medications   Prior to Admission medications   Medication Sig Start Date End Date Taking? Authorizing Provider  acetaminophen (TYLENOL) 500 MG tablet Take 1,000 mg by mouth every 6 (six) hours as needed for mild pain. For pain    Historical Provider, MD  albuterol (PROVENTIL) (2.5 MG/3ML) 0.083% nebulizer solution Take 2.5 mg by nebulization every 4 (four) hours as needed. For shortness of breath 05/28/11   Chesley Mires, MD  ALPRAZolam Duanne Moron) 0.5 MG tablet Take 0.5 mg by mouth 2 (two) times daily as needed. For anxiety    Historical Provider, MD  amoxicillin (AMOXIL) 875 MG tablet Take 875 mg by mouth 2 (two) times daily. 09/03/14   Historical Provider, MD  atorvastatin (LIPITOR) 10 MG tablet Take 10 mg by mouth at bedtime.     Historical Provider, MD  B Complex Vitamins (VITAMIN B COMPLEX PO) Take 1 tablet by mouth daily.     Historical Provider, MD  budesonide (PULMICORT) 0.25 MG/2ML nebulizer solution Take 0.25 mg by nebulization 2 (two) times daily as needed (shortness of breath).    Historical Provider, MD  bumetanide (BUMEX) 2 MG tablet Take 1 tablet (2 mg total) by mouth daily. 10/02/13   Jettie Booze, MD  busPIRone (BUSPAR) 5 MG tablet Take 7.5 mg by mouth 2 (two) times daily.     Historical Provider, MD  BYSTOLIC 10 MG tablet TAKE 1 TABLET (10 MG TOTAL) BY MOUTH DAILY. 09/13/14   Jettie Booze, MD  Canagliflozin (INVOKANA) 100 MG TABS Take 100 mg by mouth daily.     Historical Provider, MD  cholestyramine Lucrezia Starch) 4 G packet Take 1 packet by mouth as needed. For loose stools, IBS.    Historical Provider, MD  clotrimazole (LOTRIMIN) 1 % cream Apply 1 application topically daily. To stomach     Historical Provider, MD  colchicine 0.6 MG tablet Take 0.6 mg by mouth daily.    Historical Provider, MD  DULoxetine (CYMBALTA) 30 MG capsule TAKE 1 CAPSULE (30 MG TOTAL) BY MOUTH 2 (TWO) TIMES DAILY. 06/01/14   Donika Keith Rake, DO  ergocalciferol (VITAMIN D2) 50000 UNITS capsule Take 50,000 Units by mouth once a week. Take on Fridays    Historical Provider, MD  Febuxostat (ULORIC) 80 MG TABS Take 80 mg by mouth at bedtime.     Historical Provider, MD  fesoterodine (TOVIAZ) 4 MG TB24 Take 4 mg by mouth daily.  Historical Provider, MD  gabapentin (NEURONTIN) 100 MG capsule TAKE ONE CAPSULE BY MOUTH EVERY MORNING AND 2 CAPS IN THE AFTERNOON AND 2 CAPS AT BEDTIME Patient taking differently: Take 100 mg by mouth 4 (four) times daily. TAKE ONE CAPSULE BY MOUTH EVERY MORNING AND 1 CAPS AT LUNCH TIME, 1 AT [facility] AND 2 CAPS AT BEDTIME 05/13/14   Donika K Patel, DO  gabapentin (NEURONTIN) 100 MG capsule TAKE ONE CAPSULE BY MOUTH EVERY MORNING AND 2 CAPS IN THE AFTERNOON AND 2 CAPS AT BEDTIME 09/13/14   Donika K Patel, DO  guaiFENesin (MUCINEX) 600 MG 12 hr tablet Take 600 mg by mouth 2 (two) times daily.     Historical Provider, MD  HYDROcodone-acetaminophen (NORCO/VICODIN) 5-325 MG per tablet Take 1 tablet by mouth every 6 (six) hours as needed for moderate pain.     Historical Provider, MD  insulin regular human CONCENTRATED (HUMULIN R) 500 UNIT/ML SOLN injection Inject 28 Units into the skin 2 (two) times daily before a meal.     Historical Provider, MD  ipratropium (ATROVENT) 0.02 % nebulizer solution Take 0.5 mg by nebulization 4 (four) times daily as needed for wheezing or shortness of breath.    Historical Provider, MD  Liraglutide (VICTOZA) 18 MG/3ML SOLN injection Inject 1.8 mg into the skin daily.    Historical Provider, MD  losartan (COZAAR) 100 MG tablet Take 1 tablet (122m) by mouth daily. 10/02/13   JJettie Booze MD  meclizine (ANTIVERT) 25 MG tablet Take 25 mg by mouth at bedtime.      Historical Provider, MD  metolazone (ZAROXOLYN) 5 MG tablet TAKE 1 TABLET (5 MG TOTAL) BY MOUTH SEE ADMIN INSTRUCTIONS. 3 TIMES WEEKLY AS NEEDED FOR EDEMA 09/13/14   JJettie Booze MD  Multiple Vitamin (MULTIVITAMIN) capsule Take 1 capsule by mouth daily.      Historical Provider, MD  Omega-3 Fatty Acids (OMEGA-3 FISH OIL) 1200 MG CAPS Take 2 capsules (2,400 mg total) by mouth 2 (two) times daily. 09/07/14   JJettie Booze MD  omeprazole (PRILOSEC) 40 MG capsule Take 40 mg by mouth 2 (two) times daily.     Historical Provider, MD  Oritavancin Diphosphate 1,200 mg in dextrose 5 % 880 mL Inject 1,200 mg into the vein once. 08/02/14   JCampbell Riches MD  potassium chloride SA (K-DUR,KLOR-CON) 20 MEQ tablet Take 20 mEq by mouth 4 (four) times daily. 1 in AM, 2 at lunch, 1 at supper and 2 at bedtime    Historical Provider, MD  primidone (MYSOLINE) 50 MG tablet Take 1 tablet (50 mg total) by mouth 2 (two) times daily. 07/19/14   DAlda Berthold DO  Probiotic Product (PROBIOTIC PO) Take 1 tablet by mouth daily.     Historical Provider, MD  promethazine (PHENERGAN) 25 MG tablet Take 25 mg by mouth every 6 (six) hours as needed for nausea or vomiting.    Historical Provider, MD  warfarin (COUMADIN) 5 MG tablet Take 5 mg by mouth daily at 6 PM. Take 5 mg on Monday, Tuesday, Wednesday, Thursday, Friday, and Saturday. Take 2.546m(half a tablet) on Sunday.    Historical Provider, MD   BP 111/56 mmHg  Pulse 71  Temp(Src) 98 F (36.7 C) (Oral)  Resp 20  Ht 5' 5"  (1.651 m)  Wt 419 lb (190.057 kg)  BMI 69.73 kg/m2  SpO2 94%  LMP 05/17/2014 (Exact Date) Physical Exam  Constitutional: She is oriented to person, place, and time. She appears well-developed  and well-nourished.  HENT:  Head: Normocephalic and atraumatic.  Tracheostomy  Eyes: Conjunctivae and EOM are normal. Pupils are equal, round, and reactive to light.  Neck: Normal range of motion. Neck supple.  Cardiovascular: Normal rate and  regular rhythm.  Exam reveals no gallop and no friction rub.   No murmur heard. Pulmonary/Chest: Effort normal and breath sounds normal. No respiratory distress. She has no wheezes. She has no rales. She exhibits no tenderness.  Upper airway noise (tracheostomy)  Abdominal: Soft. Bowel sounds are normal. She exhibits no distension and no mass. There is no tenderness. There is no rebound and no guarding.  No focal abdominal tenderness, no RLQ tenderness or pain at McBurney's point, no RUQ tenderness or Murphy's sign, no left-sided abdominal tenderness, no fluid wave, or signs of peritonitis   Musculoskeletal: Normal range of motion. She exhibits no edema or tenderness.  Neurological: She is alert and oriented to person, place, and time.  Skin: Skin is warm and dry.  Psychiatric: She has a normal mood and affect. Her behavior is normal. Judgment and thought content normal.  Nursing note and vitals reviewed.   ED Course  Procedures (including critical care time) Results for orders placed or performed during the hospital encounter of 09/20/14  CBC with Differential/Platelet  Result Value Ref Range   WBC 7.1 4.0 - 10.5 K/uL   RBC 4.39 3.87 - 5.11 MIL/uL   Hemoglobin 13.4 12.0 - 15.0 g/dL   HCT 40.2 36.0 - 46.0 %   MCV 91.6 78.0 - 100.0 fL   MCH 30.5 26.0 - 34.0 pg   MCHC 33.3 30.0 - 36.0 g/dL   RDW 14.2 11.5 - 15.5 %   Platelets 125 (L) 150 - 400 K/uL   Neutrophils Relative % 56 43 - 77 %   Neutro Abs 4.0 1.7 - 7.7 K/uL   Lymphocytes Relative 25 12 - 46 %   Lymphs Abs 1.8 0.7 - 4.0 K/uL   Monocytes Relative 16 (H) 3 - 12 %   Monocytes Absolute 1.1 (H) 0.1 - 1.0 K/uL   Eosinophils Relative 2 0 - 5 %   Eosinophils Absolute 0.2 0.0 - 0.7 K/uL   Basophils Relative 1 0 - 1 %   Basophils Absolute 0.0 0.0 - 0.1 K/uL  Basic metabolic panel  Result Value Ref Range   Sodium 136 135 - 145 mmol/L   Potassium 3.3 (L) 3.5 - 5.1 mmol/L   Chloride 95 (L) 96 - 112 mmol/L   CO2 29 19 - 32 mmol/L    Glucose, Bld 220 (H) 70 - 99 mg/dL   BUN 18 6 - 23 mg/dL   Creatinine, Ser 1.21 (H) 0.50 - 1.10 mg/dL   Calcium 8.9 8.4 - 10.5 mg/dL   GFR calc non Af Amer 51 (L) >90 mL/min   GFR calc Af Amer 59 (L) >90 mL/min   Anion gap 12 5 - 15  Urinalysis, Routine w reflex microscopic  Result Value Ref Range   Color, Urine YELLOW YELLOW   APPearance CLOUDY (A) CLEAR   Specific Gravity, Urine 1.022 1.005 - 1.030   pH 6.5 5.0 - 8.0   Glucose, UA >1000 (A) NEGATIVE mg/dL   Hgb urine dipstick MODERATE (A) NEGATIVE   Bilirubin Urine NEGATIVE NEGATIVE   Ketones, ur NEGATIVE NEGATIVE mg/dL   Protein, ur NEGATIVE NEGATIVE mg/dL   Urobilinogen, UA 0.2 0.0 - 1.0 mg/dL   Nitrite NEGATIVE NEGATIVE   Leukocytes, UA NEGATIVE NEGATIVE  Brain natriuretic peptide  Result Value Ref Range   B Natriuretic Peptide 12.5 0.0 - 100.0 pg/mL  Protime-INR  Result Value Ref Range   Prothrombin Time 29.0 (H) 11.6 - 15.2 seconds   INR 2.72 (H) 0.00 - 1.49  Urine microscopic-add on  Result Value Ref Range   Squamous Epithelial / LPF RARE RARE   RBC / HPF 3-6 <3 RBC/hpf  I-stat troponin, ED  Result Value Ref Range   Troponin i, poc 0.00 0.00 - 0.08 ng/mL   Comment 3           Dg Chest 2 View  09/20/2014   CLINICAL DATA:  Blood in the urine. Alert and oriented. No distress. Evaluate tracheostomy tube.  EXAM: CHEST  2 VIEW  COMPARISON:  07/11/2014  FINDINGS: There is a tracheostomy tube with the tip 4.6 cm above the carina in satisfactory position. There is no focal parenchymal opacity, pleural effusion, or pneumothorax. Stable cardiomegaly.  The osseous structures are unremarkable.  IMPRESSION: No active cardiopulmonary disease.   Electronically Signed   By: Kathreen Devoid   On: 09/20/2014 11:05      EKG Interpretation None      MDM   Final diagnoses:  Encounter for medical assessment  Hematuria  Tracheostomy care    Patient with reported persistent hematuria. She is being worked up for this by her  primary care provider. Was advised to come to the emergency department by her home health aide, who was concerned that she may need her tracheostomy suctioned.  Respiratory therapy provided tracheostomy care. She is well appearing.  She is not in any distress.  UA remarkable for HGB, but doubt infection.  Will check urine culture.  Patient seen by and discussed with Dr. Rogene Houston, who agrees that the patient is stable for discharge.  Recommend PCP follow-up.  Patient understands and agrees with plan.    Montine Circle, PA-C 09/20/14 1609

## 2014-09-20 NOTE — ED Notes (Signed)
MD informed patients pain is 10.10 to left leg, pt describes this as her diagnosed neuropathy pain.

## 2014-09-20 NOTE — ED Notes (Signed)
Patient states was going to regular doctor for blood in urine, but aide stated that she needs to be deep suctioned.   Patient alert and oriented, no distress at triage.   Patient states she still feels like she needs to be suctioned well, but wants to be seen for blood in urine also.

## 2014-09-21 ENCOUNTER — Telehealth: Payer: Self-pay

## 2014-09-21 LAB — URINE CULTURE: Colony Count: 50000

## 2014-09-21 NOTE — Telephone Encounter (Signed)
Called pt and pt states that she needs a tier exception for her Metolazone, not meclizine. Informed pt that I would get that process started but it can take up to 3 business days to hear back on approval. Pt verbalized understanding and was in agreement with this plan. Called [facility] and asked for PA to be sent over to our office. Gave fax number and pharmacy staff informed me that they would get this sent over to our office.

## 2014-09-23 ENCOUNTER — Encounter: Payer: Medicare Other | Attending: Family Medicine | Admitting: Dietician

## 2014-09-23 ENCOUNTER — Encounter: Payer: Self-pay | Admitting: Dietician

## 2014-09-23 ENCOUNTER — Telehealth (HOSPITAL_BASED_OUTPATIENT_CLINIC_OR_DEPARTMENT_OTHER): Payer: Self-pay | Admitting: Emergency Medicine

## 2014-09-23 DIAGNOSIS — Z713 Dietary counseling and surveillance: Secondary | ICD-10-CM | POA: Diagnosis not present

## 2014-09-23 DIAGNOSIS — Z6841 Body Mass Index (BMI) 40.0 and over, adult: Secondary | ICD-10-CM | POA: Insufficient documentation

## 2014-09-23 NOTE — Patient Instructions (Signed)
Follow Pre-Op Goals Try Protein Shakes Return for supervised weight loss visits.   Things to remember:  Please always be honest with [name]. We want to support you!  If you have any questions or concerns in between appointments, please call or email Ferol Luz, or Margarita Grizzle.  The diet after surgery will be high protein and low in carbohydrate.  Vitamins and calcium need to be taken for the rest of your life.  Feel free to include support people in any classes or appointments.

## 2014-09-23 NOTE — Telephone Encounter (Signed)
Tier exception forms were faxed over by Alvis Lemmings on 09/06/14. Sent fax over again.

## 2014-09-23 NOTE — Progress Notes (Signed)
  Pre-Op Assessment Visit:  Pre-Operative Sleeve Surgery  Medical Nutrition Therapy:  Appt start time: 4920  End time:  1000.  Patient was seen on 09/23/2014 for Pre-Operative Nutrition Assessment. Assessment and letter of approval faxed to [facility] on 09/23/2014.   Preferred Learning Style:   No preference indicated   Learning Readiness:   Ready  Handouts given during visit include:  Pre-Op Goals Bariatric Surgery Protein Shakes   During the appointment today the following Pre-Op Goals were reviewed with the patient: Maintain or lose weight as instructed by your surgeon Make healthy food choices Begin to limit portion sizes Limited concentrated sugars and fried foods Keep fat/sugar in the single digits per serving on   food labels Practice CHEWING your food  (aim for 30 chews per bite or until applesauce consistency) Practice not drinking 15 minutes before, during, and 30 minutes after each meal/snack Avoid all carbonated beverages  Avoid/limit caffeinated beverages  Avoid all sugar-sweetened beverages Consume 3 meals per day; eat every 3-5 hours Make a list of non-food related activities Aim for 64-100 ounces of FLUID daily  Aim for at least 60-80 grams of PROTEIN daily Look for a liquid protein source that contain ?15 g protein and ?5 g carbohydrate  (ex: shakes, drinks, shots)  Patient-Centered Goals: Latriece would like to have better health and getting off a lot of medications. She is a 7 level of confidence to follow pre-op goals and thinks they are very important (10).   Demonstrated degree of understanding via:  Teach Back  Teaching Method Utilized:  Visual Auditory Hands on  Barriers to learning/adherence to lifestyle change: many chronic health conditions, financial  [facility] to [facility] to enroll in Pre-Op and Post-Op Nutrition Education when surgery date is scheduled.

## 2014-09-29 ENCOUNTER — Other Ambulatory Visit: Payer: Self-pay | Admitting: Obstetrics and Gynecology

## 2014-09-29 DIAGNOSIS — R102 Pelvic and perineal pain: Secondary | ICD-10-CM

## 2014-10-01 ENCOUNTER — Ambulatory Visit
Admission: RE | Admit: 2014-10-01 | Discharge: 2014-10-01 | Disposition: A | Discharge status: Home / Self Care | Payer: Medicare Other | Source: Ambulatory Visit | Attending: Obstetrics and Gynecology | Admitting: Obstetrics and Gynecology

## 2014-10-01 DIAGNOSIS — R102 Pelvic and perineal pain: Secondary | ICD-10-CM

## 2014-10-01 MED ORDER — IOPAMIDOL (ISOVUE-300) INJECTION 61%
125.0000 mL | Freq: Once | INTRAVENOUS | Status: AC | PRN
  Administered 2014-10-01: 125 mL via INTRAVENOUS

## 2014-10-05 ENCOUNTER — Telehealth: Payer: Self-pay | Admitting: Interventional Cardiology

## 2014-10-05 NOTE — Telephone Encounter (Signed)
New message ° ° ° ° ° °Returning a nurses call °

## 2014-10-05 NOTE — Telephone Encounter (Signed)
Pt returning call in regards to denial for Tier exception for Metolazone.  Tier exception can not be done for this drug for pt's current insurance. Pt would like to know what she can do in place of the Metolazone once she runs out of this medication? Informed pt that I would route this information to Dr. Irish Lack for review and advisement. Pt verbalized understanding and was in agreement with this plan.

## 2014-10-06 NOTE — Telephone Encounter (Signed)
Gay Filler, PharmD, states there is no alternative to Metolazone. Gay Filler recommended that pt could try taking an extra Bumex as needed when she runs out of Metolazone. Will forward to Dr. Irish Lack for review.

## 2014-10-06 NOTE — Telephone Encounter (Signed)
Would check with pharmacy if there is an alternative.

## 2014-10-07 MED ORDER — BUMETANIDE 2 MG PO TABS: 2.0000 mg | ORAL_TABLET | Freq: Every day | ORAL | Status: DC

## 2014-10-07 NOTE — Telephone Encounter (Signed)
OK to follows Sally's recs.

## 2014-10-07 NOTE — Telephone Encounter (Signed)
Spoke with pt and made her aware of order to take an extra Bumex as needed when she runs out of Metolazone. Pt verbalized understanding and was in agreement with this plan.

## 2014-10-21 ENCOUNTER — Encounter: Payer: Self-pay | Admitting: Dietician

## 2014-10-21 ENCOUNTER — Encounter: Payer: Medicare Other | Attending: Family Medicine | Admitting: Dietician

## 2014-10-21 DIAGNOSIS — Z6841 Body Mass Index (BMI) 40.0 and over, adult: Secondary | ICD-10-CM | POA: Diagnosis not present

## 2014-10-21 DIAGNOSIS — Z713 Dietary counseling and surveillance: Secondary | ICD-10-CM | POA: Insufficient documentation

## 2014-10-21 NOTE — Progress Notes (Signed)
  6 Months Supervised Weight Loss Visit:   Pre-Operative sleeve gastrectomy Surgery  Medical Nutrition Therapy:  Appt start time: 0920 end time:  0935.  Primary concerns today: Supervised Weight Loss Visit. Has been working on increasing water intake. Drinking at least 120 oz of water per day and still having some soda. Working on eating 3 meals per day. Has tried some protein shakes which are giving her diarrhea. Tried Slim Fast Low carb and Muscle Milk. Blood sugar has dropped 5 x since last visit. Doctor recommended decreasing insulin dosage.    Having surgery done at [facility].  Weight: 416.0 BMI: 68.2  Patient-Centered Goals: Michaeleen would like to have better health and getting off a lot of medications. She is a 7 level of confidence to follow pre-op goals and thinks they are very important (10).  Preferred Learning Style:   No preference indicated   Learning Readiness:   Ready  Medications: see list  Recent physical activity:  none    Nutritional Diagnosis:  [place]-3.3 Obesity related to past poor dietary habits and physical inactivity as evidenced by patient attending supervised weight loss for insurance approval of bariatric surgery.    Intervention:  Nutrition counseling provided. Plan: Check with coordinator at [facility] about total number of visits and to see if visit with diabetes doctor will count as supervised weight loss visit. Reminder that only the protein shakes needs to below 5 grams of carbohydrates and more than 15 grams of protein (not all food). Try stevia as another sweetener or try other artificially flavored drinks diluted. Continue working on chewing well and not drinking 15 minutes before eating and waiting 30 minutes to drink after meals.  Teaching Method Utilized:  Visual Auditory Hands on  Barriers to learning/adherence to lifestyle change: chronic health conditions and limited mobility  Demonstrated degree of understanding via:  Teach Back    Monitoring/Evaluation:  Dietary intake, exercise, and body weight. Follow up in 1 months for 6 month supervised weight loss visit.

## 2014-10-21 NOTE — Patient Instructions (Addendum)
Check with coordinator at [facility] about total number of visits and to see if visit with diabetes doctor will count as supervised weight loss visit. Reminder that only the protein shakes needs to below 5 grams of carbohydrates and more than 15 grams of protein (not all food). Try stevia another sweetener or try other artificially flavored drinks diluted. Continue working on chewing well and not drinking 15 minutes before eating and waiting 30 minutes to drink after meals.

## 2014-11-06 ENCOUNTER — Other Ambulatory Visit: Payer: Self-pay | Admitting: Interventional Cardiology

## 2014-11-08 ENCOUNTER — Encounter (HOSPITAL_COMMUNITY): Payer: Self-pay

## 2014-11-08 ENCOUNTER — Emergency Department (HOSPITAL_COMMUNITY)
Admission: EM | Admit: 2014-11-08 | Discharge: 2014-11-08 | Disposition: A | Discharge status: Home / Self Care | Payer: Medicare Other | Attending: Emergency Medicine | Admitting: Emergency Medicine

## 2014-11-08 ENCOUNTER — Emergency Department (HOSPITAL_COMMUNITY): Payer: Medicare Other

## 2014-11-08 DIAGNOSIS — N3090 Cystitis, unspecified without hematuria: Secondary | ICD-10-CM | POA: Insufficient documentation

## 2014-11-08 DIAGNOSIS — Z8619 Personal history of other infectious and parasitic diseases: Secondary | ICD-10-CM | POA: Diagnosis not present

## 2014-11-08 DIAGNOSIS — Z79899 Other long term (current) drug therapy: Secondary | ICD-10-CM | POA: Insufficient documentation

## 2014-11-08 DIAGNOSIS — R0600 Dyspnea, unspecified: Secondary | ICD-10-CM | POA: Diagnosis present

## 2014-11-08 DIAGNOSIS — Z7951 Long term (current) use of inhaled steroids: Secondary | ICD-10-CM | POA: Diagnosis not present

## 2014-11-08 DIAGNOSIS — K219 Gastro-esophageal reflux disease without esophagitis: Secondary | ICD-10-CM | POA: Insufficient documentation

## 2014-11-08 DIAGNOSIS — F419 Anxiety disorder, unspecified: Secondary | ICD-10-CM | POA: Insufficient documentation

## 2014-11-08 DIAGNOSIS — G629 Polyneuropathy, unspecified: Secondary | ICD-10-CM | POA: Insufficient documentation

## 2014-11-08 DIAGNOSIS — I509 Heart failure, unspecified: Secondary | ICD-10-CM | POA: Insufficient documentation

## 2014-11-08 DIAGNOSIS — Z880 Allergy status to penicillin: Secondary | ICD-10-CM | POA: Diagnosis not present

## 2014-11-08 DIAGNOSIS — M1990 Unspecified osteoarthritis, unspecified site: Secondary | ICD-10-CM | POA: Insufficient documentation

## 2014-11-08 DIAGNOSIS — I209 Angina pectoris, unspecified: Secondary | ICD-10-CM | POA: Insufficient documentation

## 2014-11-08 DIAGNOSIS — E785 Hyperlipidemia, unspecified: Secondary | ICD-10-CM | POA: Diagnosis not present

## 2014-11-08 DIAGNOSIS — Z792 Long term (current) use of antibiotics: Secondary | ICD-10-CM | POA: Diagnosis not present

## 2014-11-08 DIAGNOSIS — M109 Gout, unspecified: Secondary | ICD-10-CM | POA: Insufficient documentation

## 2014-11-08 DIAGNOSIS — F329 Major depressive disorder, single episode, unspecified: Secondary | ICD-10-CM | POA: Diagnosis not present

## 2014-11-08 DIAGNOSIS — Z862 Personal history of diseases of the blood and blood-forming organs and certain disorders involving the immune mechanism: Secondary | ICD-10-CM | POA: Insufficient documentation

## 2014-11-08 DIAGNOSIS — Z8701 Personal history of pneumonia (recurrent): Secondary | ICD-10-CM | POA: Insufficient documentation

## 2014-11-08 DIAGNOSIS — Z86718 Personal history of other venous thrombosis and embolism: Secondary | ICD-10-CM | POA: Diagnosis not present

## 2014-11-08 DIAGNOSIS — N189 Chronic kidney disease, unspecified: Secondary | ICD-10-CM | POA: Insufficient documentation

## 2014-11-08 DIAGNOSIS — R0602 Shortness of breath: Secondary | ICD-10-CM | POA: Insufficient documentation

## 2014-11-08 DIAGNOSIS — Z8742 Personal history of other diseases of the female genital tract: Secondary | ICD-10-CM | POA: Diagnosis not present

## 2014-11-08 DIAGNOSIS — E119 Type 2 diabetes mellitus without complications: Secondary | ICD-10-CM | POA: Insufficient documentation

## 2014-11-08 LAB — COMPREHENSIVE METABOLIC PANEL
ALK PHOS: 67 U/L (ref 38–126)
ALT: 37 U/L (ref 14–54)
AST: 36 U/L (ref 15–41)
Albumin: 3.6 g/dL (ref 3.5–5.0)
Anion gap: 10 (ref 5–15)
BUN: 15 mg/dL (ref 6–20)
CHLORIDE: 102 mmol/L (ref 101–111)
CO2: 28 mmol/L (ref 22–32)
Calcium: 9.2 mg/dL (ref 8.9–10.3)
Creatinine, Ser: 1.03 mg/dL — ABNORMAL HIGH (ref 0.44–1.00)
GFR calc Af Amer: >60 mL/min (ref >60)
GFR calc non Af Amer: >60 mL/min (ref >60)
Glucose, Bld: 253 mg/dL — ABNORMAL HIGH (ref 65–99)
Potassium: 5 mmol/L (ref 3.5–5.1)
SODIUM: 140 mmol/L (ref 135–145)
Total Bilirubin: 0.8 mg/dL (ref 0.3–1.2)
Total Protein: 8.1 g/dL (ref 6.5–8.1)

## 2014-11-08 LAB — URINALYSIS, ROUTINE W REFLEX MICROSCOPIC
Bilirubin Urine: NEGATIVE
Ketones, ur: 15 mg/dL — AB
Nitrite: POSITIVE — AB
Protein, ur: 30 mg/dL — AB
SPECIFIC GRAVITY, URINE: 1.036 — AB (ref 1.005–1.030)
Urobilinogen, UA: 0.2 mg/dL (ref 0.0–1.0)
pH: 5.5 (ref 5.0–8.0)

## 2014-11-08 LAB — CBC WITH DIFFERENTIAL/PLATELET
Basophils Absolute: 0 10*3/uL (ref 0.0–0.1)
Basophils Relative: 1 % (ref 0–1)
EOS PCT: 2 % (ref 0–5)
Eosinophils Absolute: 0.1 10*3/uL (ref 0.0–0.7)
HCT: 42.2 % (ref 36.0–46.0)
Hemoglobin: 13.7 g/dL (ref 12.0–15.0)
LYMPHS ABS: 1.8 10*3/uL (ref 0.7–4.0)
LYMPHS PCT: 28 % (ref 12–46)
MCH: 30.7 pg (ref 26.0–34.0)
MCHC: 32.5 g/dL (ref 30.0–36.0)
MCV: 94.6 fL (ref 78.0–100.0)
MONO ABS: 0.8 10*3/uL (ref 0.1–1.0)
Monocytes Relative: 12 % (ref 3–12)
Neutro Abs: 3.8 10*3/uL (ref 1.7–7.7)
Neutrophils Relative %: 59 % (ref 43–77)
Platelets: 120 10*3/uL — ABNORMAL LOW (ref 150–400)
RBC: 4.46 MIL/uL (ref 3.87–5.11)
RDW: 14.3 % (ref 11.5–15.5)
WBC: 6.4 10*3/uL (ref 4.0–10.5)

## 2014-11-08 LAB — I-STAT TROPONIN, ED: Troponin i, poc: 0 ng/mL (ref 0.00–0.08)

## 2014-11-08 LAB — URINE MICROSCOPIC-ADD ON

## 2014-11-08 LAB — I-STAT VENOUS BLOOD GAS, ED
Acid-Base Excess: 3 mmol/L — ABNORMAL HIGH (ref 0.0–2.0)
BICARBONATE: 31.8 meq/L — AB (ref 20.0–24.0)
O2 Saturation: 64 %
PCO2 VEN: 64.4 mmHg — AB (ref 45.0–50.0)
PH VEN: 7.302 — AB (ref 7.250–7.300)
PO2 VEN: 38 mmHg (ref 30.0–45.0)
TCO2: 34 mmol/L (ref 0–100)

## 2014-11-08 LAB — BRAIN NATRIURETIC PEPTIDE: B Natriuretic Peptide: 53.8 pg/mL (ref 0.0–100.0)

## 2014-11-08 LAB — LIPASE, BLOOD: Lipase: 45 U/L (ref 22–51)

## 2014-11-08 LAB — PROTIME-INR
INR: 2.02 — ABNORMAL HIGH (ref 0.00–1.49)
PROTHROMBIN TIME: 23 s — AB (ref 11.6–15.2)

## 2014-11-08 MED ORDER — METOCLOPRAMIDE HCL 5 MG/ML IJ SOLN: 10.0000 mg | Freq: Once | INTRAMUSCULAR | Status: DC

## 2014-11-08 MED ORDER — CEFPODOXIME PROXETIL 100 MG PO TABS: 100.0000 mg | ORAL_TABLET | Freq: Two times a day (BID) | ORAL | Status: DC

## 2014-11-08 MED ORDER — DIPHENHYDRAMINE HCL 25 MG PO CAPS
25.0000 mg | ORAL_CAPSULE | Freq: Once | ORAL | Status: AC
  Administered 2014-11-08: 25 mg via ORAL
  Filled 2014-11-08: qty 1

## 2014-11-08 MED ORDER — METOCLOPRAMIDE HCL 10 MG PO TABS
10.0000 mg | ORAL_TABLET | Freq: Once | ORAL | Status: AC
  Administered 2014-11-08: 10 mg via ORAL
  Filled 2014-11-08: qty 1

## 2014-11-08 MED ORDER — NITROFURANTOIN MONOHYD MACRO 100 MG PO CAPS: 100.0000 mg | ORAL_CAPSULE | Freq: Two times a day (BID) | ORAL | Status: DC

## 2014-11-08 MED ORDER — DEXAMETHASONE 4 MG PO TABS
10.0000 mg | ORAL_TABLET | Freq: Once | ORAL | Status: AC
  Administered 2014-11-08: 10 mg via ORAL
  Filled 2014-11-08: qty 3

## 2014-11-08 MED ORDER — DIPHENHYDRAMINE HCL 50 MG/ML IJ SOLN: 25.0000 mg | Freq: Once | INTRAMUSCULAR | Status: DC

## 2014-11-08 NOTE — Discharge Instructions (Signed)

## 2014-11-08 NOTE — ED Notes (Signed)
Pt reports SOB beginning yesterday afternoon.  Pt reports she could not sleep last night because she felt like she couldn't breathe.  Pt reports that her chest "feels funny".  Pt is 100% on neb tx initiated by EMS.

## 2014-11-08 NOTE — ED Provider Notes (Signed)
CSN: [id number]     Arrival date & time 11/08/14  8032 History   First MD Initiated Contact with Patient 11/08/14 [phone]     Chief Complaint  Patient presents with  . Respiratory Distress     (Consider location/radiation/quality/duration/timing/severity/associated sxs/prior Treatment) Patient is a 54 y.o. female presenting with shortness of breath.  Shortness of Breath Severity:  Moderate Onset quality:  Gradual Duration:  1 day Timing:  Constant Progression:  Worsening Chronicity:  Recurrent Context comment:  Morbid obesity, CHF, spontaneous onset Relieved by:  Nothing Worsened by:  Nothing tried Ineffective treatments:  None tried Associated symptoms: chest pain   Associated symptoms: no vomiting     Past Medical History  Diagnosis Date  . OSA (obstructive sleep apnea)   . Morbid obesity   . Depressive disorder, not elsewhere classified   . Diabetes mellitus   . Asthma   . Dyslipidemia   . Clostridium difficile enterocolitis 2012  . Splenomegaly 06/01/2011  . Thrombocytopenia 06/01/2011  . Hepatosplenomegaly   . Gout   . GERD (gastroesophageal reflux disease)   . Osteoarthritis   . Polyneuropathy   . Urinary incontinence   . Amenorrhea   . CHF (congestive heart failure)   . Hyperlipidemia   . Leg swelling   . Nausea   . Weakness   . Palpitations   . PONV (postoperative nausea and vomiting)   . Anginal pain     no chest pain in years  . Dysrhythmia     heart palpations  . Anxiety   . Pneumonia   . Shortness of breath   . Chronic kidney disease     overactive bladder  . Peripheral neuropathy   . DVT (deep venous thrombosis)    Past Surgical History  Procedure Laterality Date  . Cholecystectomy    . Tracheostomy  2001  . Endometrial ablation    . Skin graft    . Finger surgery      ring finger on right hand  . Esophageal dilation  2003  . Breast biopsy      needle core, left  . Dilation and curettage of uterus      times 2  . Tracheal dilitation   01/24/2012    Procedure: TRACHEAL DILITATION;  Surgeon: Melissa Montane, MD;  Location: [place];  Service: ENT;  Laterality: N/A;  Trache change with possible dilation, from size 6 to size 7 uncuffed  . Amputation Right 11/19/2013    Procedure: AMPUTATION DIGIT;  Surgeon: Cammie Sickle, MD;  Location: [place];  Service: Orthopedics;  Laterality: Right;  Partial amputation right long finger, Debridement right ring finger   . Amputation Right 06/03/2014    Procedure: REVISION AMPUTATION RIGHT RING FINGER;  Surgeon: Daryll Brod, MD;  Location: [place];  Service: Orthopedics;  Laterality: Right;   Family History  Problem Relation Age of Onset  . Diabetes Father   . Hypertension Father   . Dementia Father     Deceased, 59  . Pneumonia Father   . Heart disease Mother   . Clotting disorder Mother   . Hypertension Mother   . Heart attack Mother     Deceased, 14  . Multiple myeloma Paternal Grandmother   . Cancer Paternal Grandmother     multiple myoloma  . Heart disease Paternal Grandfather   . Kidney disease Maternal Grandmother   . Hypertension Sister   . Cancer Paternal Aunt     breast  . Cancer  Cousin     breast - all three   History  Substance Use Topics  . Smoking status: Never Smoker   . Smokeless tobacco: Never Used  . Alcohol Use: No     Comment: occasional - once or twice a year   OB History    Gravida Para Term Preterm AB TAB SAB Ectopic Multiple Living   0         0     Review of Systems  Respiratory: Positive for shortness of breath.   Cardiovascular: Positive for chest pain.  Gastrointestinal: Negative for vomiting.  All other systems reviewed and are negative.     Allergies  Codeine; Molds & smuts; Allopurinol; Ciprofloxacin; Doxycycline; Fexofenadine; Keflex; Nitroglycerin er; Oritavancin; Sulfa drugs cross reactors; Sulfonamide derivatives; Accolate; Amoxicillin-pot clavulanate; Morphine; Nystatin; and Septra  Home  Medications   Prior to Admission medications   Medication Sig Start Date End Date Taking? Authorizing Provider  acetaminophen (TYLENOL) 500 MG tablet Take 1,000 mg by mouth every 6 (six) hours as needed for mild pain. For pain   Yes Historical Provider, MD  albuterol (PROVENTIL) (2.5 MG/3ML) 0.083% nebulizer solution Take 2.5 mg by nebulization every 4 (four) hours as needed. For shortness of breath 05/28/11  Yes Chesley Mires, MD  ALPRAZolam Duanne Moron) 0.5 MG tablet Take 0.5 mg by mouth 2 (two) times daily as needed. For anxiety   Yes Historical Provider, MD  amoxicillin (AMOXIL) 875 MG tablet Take 875 mg by mouth 2 (two) times daily. 09/03/14  Yes Historical Provider, MD  atorvastatin (LIPITOR) 10 MG tablet Take 10 mg by mouth at bedtime.    Yes Historical Provider, MD  B Complex Vitamins (VITAMIN B COMPLEX PO) Take 1 tablet by mouth daily.    Yes Historical Provider, MD  budesonide (PULMICORT) 0.25 MG/2ML nebulizer solution Take 0.25 mg by nebulization 2 (two) times daily as needed (shortness of breath).   Yes Historical Provider, MD  bumetanide (BUMEX) 2 MG tablet Take 1 tablet (2 mg total) by mouth daily. May take one extra 35m tab daily as needed for edema. 10/07/14  Yes JJettie Booze MD  busPIRone (BUSPAR) 5 MG tablet Take 7.5 mg by mouth 2 (two) times daily.    Yes Historical Provider, MD  BYSTOLIC 10 MG tablet TAKE 1 TABLET (10 MG TOTAL) BY MOUTH DAILY. 09/13/14  Yes JJettie Booze MD  Canagliflozin (INVOKANA) 100 MG TABS Take 100 mg by mouth daily.    Yes Historical Provider, MD  cholestyramine (Lucrezia Starch 4 G packet Take 1 packet by mouth as needed. For loose stools, IBS.   Yes Historical Provider, MD  clotrimazole (LOTRIMIN) 1 % cream Apply 1 application topically daily. To stomach   Yes Historical Provider, MD  colchicine 0.6 MG tablet Take 0.6 mg by mouth daily.   Yes Historical Provider, MD  DULoxetine (CYMBALTA) 30 MG capsule TAKE 1 CAPSULE (30 MG TOTAL) BY MOUTH 2 (TWO) TIMES  DAILY. 06/01/14  Yes Donika K Patel, DO  ergocalciferol (VITAMIN D2) 50000 UNITS capsule Take 50,000 Units by mouth once a week. Take on Fridays   Yes Historical Provider, MD  Febuxostat (ULORIC) 80 MG TABS Take 80 mg by mouth at bedtime.    Yes Historical Provider, MD  fesoterodine (TOVIAZ) 4 MG TB24 Take 4 mg by mouth daily.     Yes Historical Provider, MD  fluticasone (FLONASE) 50 MCG/ACT nasal spray Place 1 spray into both nostrils daily. 10/04/14  Yes Historical Provider, MD  gabapentin (NEURONTIN) 100  MG capsule TAKE ONE CAPSULE BY MOUTH EVERY MORNING AND 2 CAPS IN THE AFTERNOON AND 2 CAPS AT BEDTIME Patient taking differently: Take 100 mg by mouth 4 (four) times daily. TAKE ONE CAPSULE BY MOUTH EVERY MORNING AND 1 CAPS AT LUNCH TIME, 1 AT [facility] AND 2 CAPS AT BEDTIME 05/13/14  Yes Donika K Patel, DO  guaiFENesin (MUCINEX) 600 MG 12 hr tablet Take 600 mg by mouth 2 (two) times daily.    Yes Historical Provider, MD  HYDROcodone-acetaminophen (NORCO/VICODIN) 5-325 MG per tablet Take 1 tablet by mouth every 6 (six) hours as needed for moderate pain.    Yes Historical Provider, MD  ipratropium (ATROVENT) 0.02 % nebulizer solution Take 0.5 mg by nebulization 4 (four) times daily as needed for wheezing or shortness of breath.   Yes Historical Provider, MD  Liraglutide (VICTOZA) 18 MG/3ML SOLN injection Inject 1.8 mg into the skin daily.   Yes Historical Provider, MD  losartan (COZAAR) 100 MG tablet TAKE 1 TABLET (100MG) BY MOUTH DAILY. 11/08/14  Yes Jettie Booze, MD  meclizine (ANTIVERT) 25 MG tablet Take 25 mg by mouth at bedtime.    Yes Historical Provider, MD  metolazone (ZAROXOLYN) 5 MG tablet TAKE 1 TABLET (5 MG TOTAL) BY MOUTH SEE ADMIN INSTRUCTIONS. 3 TIMES WEEKLY AS NEEDED FOR EDEMA 09/13/14  Yes Jettie Booze, MD  Multiple Vitamin (MULTIVITAMIN) capsule Take 1 capsule by mouth daily.     Yes Historical Provider, MD  Omega-3 Fatty Acids (OMEGA-3 FISH OIL) 1200 MG CAPS Take 2 capsules  (2,400 mg total) by mouth 2 (two) times daily. 09/07/14  Yes Jettie Booze, MD  omeprazole (PRILOSEC) 40 MG capsule Take 40 mg by mouth 2 (two) times daily.    Yes Historical Provider, MD  potassium chloride SA (K-DUR,KLOR-CON) 20 MEQ tablet Take 20 mEq by mouth 4 (four) times daily. 1 in AM, 2 at lunch, 1 at supper and 2 at bedtime   Yes Historical Provider, MD  primidone (MYSOLINE) 50 MG tablet Take 1 tablet (50 mg total) by mouth 2 (two) times daily. 07/19/14  Yes Donika Keith Rake, DO  Probiotic Product (PROBIOTIC PO) Take 1 tablet by mouth daily.    Yes Historical Provider, MD  promethazine (PHENERGAN) 25 MG tablet Take 25 mg by mouth every 6 (six) hours as needed for nausea or vomiting.   Yes Historical Provider, MD  triamcinolone cream (KENALOG) 0.1 % Apply 1 application topically daily as needed. itching 10/27/14  Yes Historical Provider, MD  warfarin (COUMADIN) 5 MG tablet Take 5 mg by mouth daily at 6 PM. Take 5 mg on Monday, Tuesday, Wednesday, Thursday, Friday, and Saturday. Take 2.68m (half a tablet) on Sunday.   Yes Historical Provider, MD  cefpodoxime (VANTIN) 100 MG tablet Take 1 tablet (100 mg total) by mouth 2 (two) times daily. 11/08/14   MDebby Freiberg MD  gabapentin (NEURONTIN) 100 MG capsule TAKE ONE CAPSULE BY MOUTH EVERY MORNING AND 2 CAPS IN THE AFTERNOON AND 2 CAPS AT BEDTIME Patient not taking: Reported on 11/08/2014 09/13/14   Donika K Patel, DO  insulin regular human CONCENTRATED (HUMULIN R) 500 UNIT/ML SOLN injection Inject 28 Units into the skin 2 (two) times daily before a meal.     Historical Provider, MD  Oritavancin Diphosphate 1,200 mg in dextrose 5 % 880 mL Inject 1,200 mg into the vein once. Patient not taking: Reported on 11/08/2014 08/02/14   JCampbell Riches MD   BP 115/60 mmHg  Pulse 68  Temp(Src) 98.4 F (36.9 C) (Oral)  Resp 18  Ht 5' 5"  (1.651 m)  Wt 418 lb (189.604 kg)  BMI 69.56 kg/m2  SpO2 96%  LMP 05/17/2014 (Exact Date) Physical Exam   Constitutional: She is oriented to person, place, and time. She appears well-developed and well-nourished.  HENT:  Head: Normocephalic and atraumatic.  Right Ear: External ear normal.  Left Ear: External ear normal.  Eyes: Conjunctivae and EOM are normal. Pupils are equal, round, and reactive to light.  Neck: Normal range of motion. Neck supple.  Cardiovascular: Normal rate, regular rhythm, normal heart sounds and intact distal pulses.   Pulmonary/Chest: Effort normal. She has rales in the right lower field and the left lower field.  Abdominal: Soft. Bowel sounds are normal. There is no tenderness.  Musculoskeletal: Normal range of motion.  Neurological: She is alert and oriented to person, place, and time.  Skin: Skin is warm and dry.  Vitals reviewed.   ED Course  Procedures (including critical care time) Labs Review Labs Reviewed  CBC WITH DIFFERENTIAL/PLATELET - Abnormal; Notable for the following:    Platelets 120 (*)    All other components within normal limits  COMPREHENSIVE METABOLIC PANEL - Abnormal; Notable for the following:    Glucose, Bld 253 (*)    Creatinine, Ser 1.03 (*)    All other components within normal limits  URINALYSIS, ROUTINE W REFLEX MICROSCOPIC - Abnormal; Notable for the following:    APPearance CLOUDY (*)    Specific Gravity, Urine 1.036 (*)    Glucose, UA >1000 (*)    Hgb urine dipstick LARGE (*)    Ketones, ur 15 (*)    Protein, ur 30 (*)    Nitrite POSITIVE (*)    Leukocytes, UA SMALL (*)    All other components within normal limits  PROTIME-INR - Abnormal; Notable for the following:    Prothrombin Time 23.0 (*)    INR 2.02 (*)    All other components within normal limits  URINE MICROSCOPIC-ADD ON - Abnormal; Notable for the following:    Squamous Epithelial / LPF FEW (*)    Bacteria, UA MANY (*)    All other components within normal limits  I-STAT VENOUS BLOOD GAS, ED - Abnormal; Notable for the following:    pH, Ven 7.302 (*)     pCO2, Ven 64.4 (*)    Bicarbonate 31.8 (*)    Acid-Base Excess 3.0 (*)    All other components within normal limits  BRAIN NATRIURETIC PEPTIDE  LIPASE, BLOOD  I-STAT TROPOININ, ED    Imaging Review Dg Chest 2 View  11/08/2014   CLINICAL DATA:  Chest tightness and shortness of breath since last night.  EXAM: CHEST  2 VIEW  COMPARISON:  09/20/2014  FINDINGS: There is cardiomegaly. Mild vascular congestion. No overt edema. No confluent opacities or effusions. No acute bony abnormality.  IMPRESSION: Cardiomegaly, vascular congestion   Electronically Signed   By: Rolm Baptise [name]   On: 11/08/2014 10:38     EKG Interpretation   Date/Time:  Monday Nov 08 2014 09:49:12 EDT Ventricular Rate:  72 PR Interval:  173 QRS Duration: 102 QT Interval:  418 QTC Calculation: 457 R Axis:   23 Text Interpretation:  Sinus rhythm Low voltage, precordial leads No  significant change since last tracing Confirmed by Debby Freiberg [phone])  on 11/08/2014 9:56:33 AM      MDM   Final diagnoses:  Dyspnea  Cystitis    54 y.o. female with pertinent  PMH of chf, DM, prior DVT, CKD, obesity presents with dyspnea as above.  Pt is on coumadin for prior DVT.  On arrival today pt had relief of symptoms after 1 albuterol treatment by EMS.  No wheezing on my exam, pt states she is at her baseline (has a trach).  Wu as above unremarkable for pt baseline.  Likely bronchospasm vs mucous plugging.  Doubt ACS (had symptoms last night >6 hours prior to trop), PE (therapeutic on coumadin), PNA.  Discussed potentially treating with steroids, however with DM and no wheezing on exam, deferred use of this therapy.  DC home to fu with PCP.  I have reviewed all laboratory and imaging studies if ordered as above  1. Dyspnea   2. Cystitis         Debby Freiberg, MD 11/10/14 [phone]

## 2014-11-18 ENCOUNTER — Ambulatory Visit: Payer: Medicare Other | Admitting: Dietician

## 2014-12-08 ENCOUNTER — Encounter (HOSPITAL_BASED_OUTPATIENT_CLINIC_OR_DEPARTMENT_OTHER): Payer: Medicare Other | Attending: Surgery

## 2014-12-08 DIAGNOSIS — I129 Hypertensive chronic kidney disease with stage 1 through stage 4 chronic kidney disease, or unspecified chronic kidney disease: Secondary | ICD-10-CM | POA: Diagnosis not present

## 2014-12-08 DIAGNOSIS — E11622 Type 2 diabetes mellitus with other skin ulcer: Secondary | ICD-10-CM | POA: Diagnosis present

## 2014-12-08 DIAGNOSIS — Z7951 Long term (current) use of inhaled steroids: Secondary | ICD-10-CM | POA: Diagnosis not present

## 2014-12-08 DIAGNOSIS — J45909 Unspecified asthma, uncomplicated: Secondary | ICD-10-CM | POA: Diagnosis not present

## 2014-12-08 DIAGNOSIS — J449 Chronic obstructive pulmonary disease, unspecified: Secondary | ICD-10-CM | POA: Diagnosis not present

## 2014-12-08 DIAGNOSIS — I509 Heart failure, unspecified: Secondary | ICD-10-CM | POA: Diagnosis not present

## 2014-12-08 DIAGNOSIS — Z79899 Other long term (current) drug therapy: Secondary | ICD-10-CM | POA: Insufficient documentation

## 2014-12-08 DIAGNOSIS — L98491 Non-pressure chronic ulcer of skin of other sites limited to breakdown of skin: Secondary | ICD-10-CM | POA: Diagnosis not present

## 2014-12-08 DIAGNOSIS — M1990 Unspecified osteoarthritis, unspecified site: Secondary | ICD-10-CM | POA: Insufficient documentation

## 2014-12-08 DIAGNOSIS — K589 Irritable bowel syndrome without diarrhea: Secondary | ICD-10-CM | POA: Diagnosis not present

## 2014-12-08 DIAGNOSIS — G4730 Sleep apnea, unspecified: Secondary | ICD-10-CM | POA: Diagnosis not present

## 2014-12-08 DIAGNOSIS — Z89021 Acquired absence of right finger(s): Secondary | ICD-10-CM | POA: Insufficient documentation

## 2014-12-08 DIAGNOSIS — E1122 Type 2 diabetes mellitus with diabetic chronic kidney disease: Secondary | ICD-10-CM | POA: Diagnosis not present

## 2014-12-08 DIAGNOSIS — I499 Cardiac arrhythmia, unspecified: Secondary | ICD-10-CM | POA: Diagnosis not present

## 2014-12-08 DIAGNOSIS — E1140 Type 2 diabetes mellitus with diabetic neuropathy, unspecified: Secondary | ICD-10-CM | POA: Insufficient documentation

## 2014-12-08 DIAGNOSIS — K219 Gastro-esophageal reflux disease without esophagitis: Secondary | ICD-10-CM | POA: Diagnosis not present

## 2014-12-08 DIAGNOSIS — Z7952 Long term (current) use of systemic steroids: Secondary | ICD-10-CM | POA: Insufficient documentation

## 2014-12-08 DIAGNOSIS — I739 Peripheral vascular disease, unspecified: Secondary | ICD-10-CM | POA: Diagnosis not present

## 2014-12-08 DIAGNOSIS — Z79891 Long term (current) use of opiate analgesic: Secondary | ICD-10-CM | POA: Insufficient documentation

## 2014-12-08 DIAGNOSIS — Z86718 Personal history of other venous thrombosis and embolism: Secondary | ICD-10-CM | POA: Insufficient documentation

## 2014-12-08 DIAGNOSIS — Z7901 Long term (current) use of anticoagulants: Secondary | ICD-10-CM | POA: Insufficient documentation

## 2014-12-08 DIAGNOSIS — E785 Hyperlipidemia, unspecified: Secondary | ICD-10-CM | POA: Insufficient documentation

## 2014-12-08 DIAGNOSIS — N183 Chronic kidney disease, stage 3 (moderate): Secondary | ICD-10-CM | POA: Diagnosis not present

## 2014-12-08 DIAGNOSIS — Z794 Long term (current) use of insulin: Secondary | ICD-10-CM | POA: Diagnosis not present

## 2014-12-08 DIAGNOSIS — Z930 Tracheostomy status: Secondary | ICD-10-CM | POA: Diagnosis not present

## 2014-12-08 DIAGNOSIS — M109 Gout, unspecified: Secondary | ICD-10-CM | POA: Diagnosis not present

## 2014-12-08 DIAGNOSIS — Z6841 Body Mass Index (BMI) 40.0 and over, adult: Secondary | ICD-10-CM | POA: Diagnosis not present

## 2014-12-09 ENCOUNTER — Ambulatory Visit (HOSPITAL_COMMUNITY)
Admission: RE | Admit: 2014-12-09 | Discharge: 2014-12-09 | Disposition: A | Discharge status: Home / Self Care | Payer: Medicare Other | Source: Ambulatory Visit | Attending: Family Medicine | Admitting: Family Medicine

## 2014-12-09 ENCOUNTER — Other Ambulatory Visit (HOSPITAL_COMMUNITY): Payer: Self-pay | Admitting: Family Medicine

## 2014-12-09 DIAGNOSIS — L98499 Non-pressure chronic ulcer of skin of other sites with unspecified severity: Secondary | ICD-10-CM | POA: Insufficient documentation

## 2014-12-09 DIAGNOSIS — R52 Pain, unspecified: Secondary | ICD-10-CM

## 2014-12-15 DIAGNOSIS — E11622 Type 2 diabetes mellitus with other skin ulcer: Secondary | ICD-10-CM | POA: Diagnosis not present

## 2014-12-16 ENCOUNTER — Ambulatory Visit: Payer: Medicare Other | Admitting: Dietician

## 2014-12-21 ENCOUNTER — Ambulatory Visit: Payer: Medicare Other | Admitting: Dietician

## 2014-12-23 ENCOUNTER — Other Ambulatory Visit: Payer: Self-pay | Admitting: Surgery

## 2014-12-23 DIAGNOSIS — L98499 Non-pressure chronic ulcer of skin of other sites with unspecified severity: Secondary | ICD-10-CM

## 2014-12-29 ENCOUNTER — Encounter (HOSPITAL_BASED_OUTPATIENT_CLINIC_OR_DEPARTMENT_OTHER): Payer: Medicare Other | Attending: Surgery

## 2014-12-29 DIAGNOSIS — M1990 Unspecified osteoarthritis, unspecified site: Secondary | ICD-10-CM | POA: Diagnosis not present

## 2014-12-29 DIAGNOSIS — I890 Lymphedema, not elsewhere classified: Secondary | ICD-10-CM | POA: Insufficient documentation

## 2014-12-29 DIAGNOSIS — S60511A Abrasion of right hand, initial encounter: Secondary | ICD-10-CM | POA: Diagnosis not present

## 2014-12-29 DIAGNOSIS — G4730 Sleep apnea, unspecified: Secondary | ICD-10-CM | POA: Insufficient documentation

## 2014-12-29 DIAGNOSIS — S60151A Contusion of right little finger with damage to nail, initial encounter: Secondary | ICD-10-CM | POA: Diagnosis not present

## 2014-12-29 DIAGNOSIS — E11622 Type 2 diabetes mellitus with other skin ulcer: Secondary | ICD-10-CM | POA: Insufficient documentation

## 2014-12-29 DIAGNOSIS — S60132A Contusion of left middle finger with damage to nail, initial encounter: Secondary | ICD-10-CM | POA: Insufficient documentation

## 2014-12-29 DIAGNOSIS — Z86718 Personal history of other venous thrombosis and embolism: Secondary | ICD-10-CM | POA: Insufficient documentation

## 2014-12-29 DIAGNOSIS — I10 Essential (primary) hypertension: Secondary | ICD-10-CM | POA: Insufficient documentation

## 2014-12-29 DIAGNOSIS — Z930 Tracheostomy status: Secondary | ICD-10-CM | POA: Diagnosis not present

## 2014-12-29 DIAGNOSIS — E1142 Type 2 diabetes mellitus with diabetic polyneuropathy: Secondary | ICD-10-CM | POA: Insufficient documentation

## 2014-12-29 DIAGNOSIS — S60159A Contusion of unspecified little finger with damage to nail, initial encounter: Secondary | ICD-10-CM | POA: Insufficient documentation

## 2014-12-29 DIAGNOSIS — J449 Chronic obstructive pulmonary disease, unspecified: Secondary | ICD-10-CM | POA: Insufficient documentation

## 2014-12-29 DIAGNOSIS — S60512A Abrasion of left hand, initial encounter: Secondary | ICD-10-CM | POA: Diagnosis not present

## 2014-12-29 DIAGNOSIS — X58XXXA Exposure to other specified factors, initial encounter: Secondary | ICD-10-CM | POA: Diagnosis not present

## 2014-12-29 DIAGNOSIS — M109 Gout, unspecified: Secondary | ICD-10-CM | POA: Insufficient documentation

## 2015-01-03 ENCOUNTER — Ambulatory Visit: Payer: Medicare Other | Admitting: Dietician

## 2015-01-05 DIAGNOSIS — E11622 Type 2 diabetes mellitus with other skin ulcer: Secondary | ICD-10-CM | POA: Diagnosis not present

## 2015-01-10 ENCOUNTER — Inpatient Hospital Stay (HOSPITAL_COMMUNITY)
Admission: AD | Admit: 2015-01-10 | Discharge: 2015-01-10 | Disposition: A | Discharge status: Home / Self Care | Payer: Medicare Other | Source: Ambulatory Visit | Attending: Obstetrics and Gynecology | Admitting: Obstetrics and Gynecology

## 2015-01-10 ENCOUNTER — Inpatient Hospital Stay: Admission: RE | Admit: 2015-01-10 | Payer: Medicare Other | Source: Ambulatory Visit

## 2015-01-10 ENCOUNTER — Encounter (HOSPITAL_COMMUNITY): Payer: Self-pay | Admitting: *Deleted

## 2015-01-10 DIAGNOSIS — Z930 Tracheostomy status: Secondary | ICD-10-CM | POA: Diagnosis not present

## 2015-01-10 DIAGNOSIS — N939 Abnormal uterine and vaginal bleeding, unspecified: Secondary | ICD-10-CM | POA: Diagnosis not present

## 2015-01-10 LAB — CBC
HEMATOCRIT: 36.4 % (ref 36.0–46.0)
HEMOGLOBIN: 12.2 g/dL (ref 12.0–15.0)
MCH: 31 pg (ref 26.0–34.0)
MCHC: 33.5 g/dL (ref 30.0–36.0)
MCV: 92.6 fL (ref 78.0–100.0)
Platelets: 108 10*3/uL — ABNORMAL LOW (ref 150–400)
RBC: 3.93 MIL/uL (ref 3.87–5.11)
RDW: 14.2 % (ref 11.5–15.5)
WBC: 5.3 10*3/uL (ref 4.0–10.5)

## 2015-01-10 MED ORDER — NORETHINDRONE ACETATE 5 MG PO TABS: 10.0000 mg | ORAL_TABLET | Freq: Every day | ORAL | Status: DC

## 2015-01-10 NOTE — MAU Provider Note (Signed)
History     CSN: [id number]  Arrival date and time: 01/10/15 1306   None     No chief complaint on file.  HPI  [name] is a 54 y.o. female G0P0 presenting to MAU via EMS with heavy vaginal bleeding. I was asked by Dr. Cletis Media to see this patient in MAU. The patient had an ablation done in 2007 due to irregular/ heavy bleeding . She was on Depo provera in the past, which failed and led her to the ablation.  She did not have any vaginal bleeding since the procedure, however has started bleeding in the last 2 weeks. She saw Dr. Raphael Gibney on Thursday for the vaginal bleeding. He recommended an IUD and a D&C along with a pap smear. He would like to do it all in the hospital setting.  She is experiencing some abdominal pain.  Her pain is located in the lower part of her stomach; both sides. She has taken narcotics for the pain with some relief. She cannot take NSAIDS.   She has taken extra strength tylenol and vicodin and nothing helps the pain. The last time she took vicodin was 10 am.  In the last 24 hours she has gone through 3-6 double pads for the bleeding.    OB History    Gravida Para Term Preterm AB TAB SAB Ectopic Multiple Living   0         0      Past Medical History  Diagnosis Date  . OSA (obstructive sleep apnea)   . Morbid obesity   . Depressive disorder, not elsewhere classified   . Diabetes mellitus   . Asthma   . Dyslipidemia   . Clostridium difficile enterocolitis 2012  . Splenomegaly 06/01/2011  . Thrombocytopenia 06/01/2011  . Hepatosplenomegaly   . Gout   . GERD (gastroesophageal reflux disease)   . Osteoarthritis   . Polyneuropathy   . Urinary incontinence   . Amenorrhea   . CHF (congestive heart failure)   . Hyperlipidemia   . Leg swelling   . Nausea   . Weakness   . Palpitations   . PONV (postoperative nausea and vomiting)   . Anginal pain     no chest pain in years  . Dysrhythmia     heart palpations  . Anxiety   . Pneumonia   .  Shortness of breath   . Chronic kidney disease     overactive bladder  . Peripheral neuropathy   . DVT (deep venous thrombosis)     Past Surgical History  Procedure Laterality Date  . Cholecystectomy    . Tracheostomy  2001  . Endometrial ablation    . Skin graft    . Finger surgery      ring finger on right hand  . Esophageal dilation  2003  . Breast biopsy      needle core, left  . Dilation and curettage of uterus      times 2  . Tracheal dilitation  01/24/2012    Procedure: TRACHEAL DILITATION;  Surgeon: Melissa Montane, MD;  Location: [facility];  Service: ENT;  Laterality: N/A;  Trache change with possible dilation, from size 6 to size 7 uncuffed  . Amputation Right 11/19/2013    Procedure: AMPUTATION DIGIT;  Surgeon: Cammie Sickle, MD;  Location: [place];  Service: Orthopedics;  Laterality: Right;  Partial amputation right long finger, Debridement right ring finger   . Amputation Right 06/03/2014  Procedure: REVISION AMPUTATION RIGHT RING FINGER;  Surgeon: Daryll Brod, MD;  Location: [place];  Service: Orthopedics;  Laterality: Right;    Family History  Problem Relation Age of Onset  . Diabetes Father   . Hypertension Father   . Dementia Father     Deceased, 71  . Pneumonia Father   . Heart disease Mother   . Clotting disorder Mother   . Hypertension Mother   . Heart attack Mother     Deceased, 104  . Multiple myeloma Paternal Grandmother   . Cancer Paternal Grandmother     multiple myoloma  . Heart disease Paternal Grandfather   . Kidney disease Maternal Grandmother   . Hypertension Sister   . Cancer Paternal Aunt     breast  . Cancer Cousin     breast - all three    History  Substance Use Topics  . Smoking status: Never Smoker   . Smokeless tobacco: Never Used  . Alcohol Use: No     Comment: occasional - once or twice a year    Allergies:  Allergies  Allergen Reactions  . Codeine Other (See Comments)    Heart  problems  . Molds & Smuts Shortness Of Breath and Rash  . Allopurinol Other (See Comments)    GI problems   . Ciprofloxacin Other (See Comments)    GI problems  . Doxycycline Other (See Comments)    Gastric problems  . Fexofenadine Other (See Comments)    Hurts joints/pain  . Keflex [Cephalexin] Diarrhea and Nausea Only  . Nitroglycerin Er Nausea And Vomiting  . Oritavancin Itching    Mild-moderate itching.   . Sulfa Drugs Cross Reactors Nausea And Vomiting  . Sulfonamide Derivatives Nausea Only  . Accolate [Zafirlukast] Other (See Comments)    Reaction unknown  . Amoxicillin-Pot Clavulanate Other (See Comments)    Other reaction(s): Unknown  . Morphine Other (See Comments)    unknown  . Nystatin Rash  . Septra [Sulfamethoxazole-Trimethoprim] Other (See Comments)    unknown    Prescriptions prior to admission  Medication Sig Dispense Refill Last Dose  . acetaminophen (TYLENOL) 500 MG tablet Take 1,000 mg by mouth every 6 (six) hours as needed for mild pain. For pain   01/10/2015 at Unknown time  . ALPRAZolam (XANAX) 0.5 MG tablet Take 0.5 mg by mouth 2 (two) times daily as needed. For anxiety   Past Week at Unknown time  . atorvastatin (LIPITOR) 10 MG tablet Take 10 mg by mouth at bedtime.    01/09/2015 at Unknown time  . B Complex Vitamins (VITAMIN B COMPLEX PO) Take 1 tablet by mouth daily.    01/09/2015 at Unknown time  . bumetanide (BUMEX) 2 MG tablet Take 1 tablet (2 mg total) by mouth daily. May take one extra 106m tab daily as needed for edema. 90 tablet 3 01/09/2015 at Unknown time  . busPIRone (BUSPAR) 5 MG tablet Take 7.5 mg by mouth 2 (two) times daily.    01/10/2015 at Unknown time  . BYSTOLIC 10 MG tablet TAKE 1 TABLET (10 MG TOTAL) BY MOUTH DAILY. 90 tablet 3 01/09/2015 at Unknown time  . clotrimazole (LOTRIMIN) 1 % cream Apply 1 application topically daily. To stomach   01/10/2015 at Unknown time  . colchicine 0.6 MG tablet Take 0.6 mg by mouth daily.   01/10/2015 at  Unknown time  . DULoxetine (CYMBALTA) 30 MG capsule TAKE 1 CAPSULE (30 MG TOTAL) BY MOUTH 2 (TWO) TIMES DAILY.  180 capsule 3 01/09/2015 at Unknown time  . ergocalciferol (VITAMIN D2) 50000 UNITS capsule Take 50,000 Units by mouth once a week. Take on Fridays   Past Week at Unknown time  . Febuxostat (ULORIC) 80 MG TABS Take 80 mg by mouth at bedtime.    01/09/2015 at Unknown time  . fesoterodine (TOVIAZ) 4 MG TB24 Take 4 mg by mouth daily.     01/09/2015 at Unknown time  . fluticasone (FLONASE) 50 MCG/ACT nasal spray Place 1 spray into both nostrils daily.  3 11/07/2014 at Unknown time  . gabapentin (NEURONTIN) 100 MG capsule TAKE ONE CAPSULE BY MOUTH EVERY MORNING AND 2 CAPS IN THE AFTERNOON AND 2 CAPS AT BEDTIME (Patient taking differently: Take 100 mg by mouth 4 (four) times daily. TAKE ONE CAPSULE BY MOUTH EVERY MORNING AND 1 CAPS AT LUNCH TIME, 1 AT [facility] AND 2 CAPS AT BEDTIME) 480 capsule 3 01/09/2015 at Unknown time  . guaiFENesin (MUCINEX) 600 MG 12 hr tablet Take 600 mg by mouth 2 (two) times daily.    01/09/2015 at Unknown time  . HYDROcodone-acetaminophen (NORCO/VICODIN) 5-325 MG per tablet Take 1 tablet by mouth every 6 (six) hours as needed for moderate pain.    01/09/2015 at Unknown time  . insulin regular human CONCENTRATED (HUMULIN R) 500 UNIT/ML SOLN injection Inject 8-22 Units into the skin 2 (two) times daily before a meal.    01/10/2015 at Unknown time  . Liraglutide (VICTOZA) 18 MG/3ML SOLN injection Inject 1.8 mg into the skin daily.   01/10/2015 at Unknown time  . losartan (COZAAR) 100 MG tablet TAKE 1 TABLET (100MG) BY MOUTH DAILY. 90 tablet 2 01/09/2015 at Unknown time  . meclizine (ANTIVERT) 25 MG tablet Take 25 mg by mouth at bedtime.    01/09/2015 at Unknown time  . Multiple Vitamin (MULTIVITAMIN) capsule Take 1 capsule by mouth daily.     01/10/2015 at Unknown time  . Omega-3 Fatty Acids (OMEGA-3 FISH OIL) 1200 MG CAPS Take 2 capsules (2,400 mg total) by mouth 2 (two) times daily. 120  capsule 6 01/09/2015 at Unknown time  . omeprazole (PRILOSEC) 40 MG capsule Take 40 mg by mouth 2 (two) times daily.    01/10/2015 at Unknown time  . potassium chloride SA (K-DUR,KLOR-CON) 20 MEQ tablet Take 20 mEq by mouth 4 (four) times daily. 1 in AM, 2 at lunch, 1 at supper and 2 at bedtime   01/10/2015 at Unknown time  . primidone (MYSOLINE) 50 MG tablet Take 1 tablet (50 mg total) by mouth 2 (two) times daily. 180 tablet 3 01/09/2015 at Unknown time  . Probiotic Product (PROBIOTIC PO) Take 1 tablet by mouth daily.    01/10/2015 at Unknown time  . promethazine (PHENERGAN) 25 MG tablet Take 25 mg by mouth every 6 (six) hours as needed for nausea or vomiting.   01/10/2015 at Unknown time  . triamcinolone cream (KENALOG) 0.1 % Apply 1 application topically daily as needed. itching  6 01/10/2015 at Unknown time  . warfarin (COUMADIN) 5 MG tablet Take 5 mg by mouth daily at 6 PM. Take 5 mg on Monday, Tuesday, Wednesday, Thursday, Friday, and Saturday. Take 2.61m (half a tablet) on Sunday.   01/09/2015 at Unknown time  . albuterol (PROVENTIL) (2.5 MG/3ML) 0.083% nebulizer solution Take 2.5 mg by nebulization every 4 (four) hours as needed. For shortness of breath   Past Week at Unknown time  . amoxicillin (AMOXIL) 875 MG tablet Take 875 mg by mouth 2 (two)  times daily.  0 11/07/2014 at Unknown time  . budesonide (PULMICORT) 0.25 MG/2ML nebulizer solution Take 0.25 mg by nebulization 2 (two) times daily as needed (shortness of breath).   11/07/2014 at Unknown time  . cefpodoxime (VANTIN) 100 MG tablet Take 1 tablet (100 mg total) by mouth 2 (two) times daily. 14 tablet 0   . cholestyramine (QUESTRAN) 4 G packet Take 1 packet by mouth as needed. For loose stools, IBS.   Past Week at Unknown time  . gabapentin (NEURONTIN) 100 MG capsule TAKE ONE CAPSULE BY MOUTH EVERY MORNING AND 2 CAPS IN THE AFTERNOON AND 2 CAPS AT BEDTIME (Patient not taking: Reported on 11/08/2014) 480 capsule 3 Not Taking at Unknown time  .  ipratropium (ATROVENT) 0.02 % nebulizer solution Take 0.5 mg by nebulization 4 (four) times daily as needed for wheezing or shortness of breath.   Past Week at Unknown time  . metolazone (ZAROXOLYN) 5 MG tablet TAKE 1 TABLET (5 MG TOTAL) BY MOUTH SEE ADMIN INSTRUCTIONS. 3 TIMES WEEKLY AS NEEDED FOR EDEMA 15 tablet 3 11/07/2014 at Unknown time  . Oritavancin Diphosphate 1,200 mg in dextrose 5 % 880 mL Inject 1,200 mg into the vein once. (Patient not taking: Reported on 11/08/2014) 1 Units 0 Completed Course at Unknown time   Results for orders placed or performed during the hospital encounter of 01/10/15 (from the past 48 hour(s))  CBC     Status: None (Preliminary result)   Collection Time: 01/10/15  1:52 PM  Result Value Ref Range   WBC 5.3 4.0 - 10.5 K/uL   RBC 3.93 3.87 - 5.11 MIL/uL   Hemoglobin 12.2 12.0 - 15.0 g/dL   HCT 36.4 36.0 - 46.0 %   MCV 92.6 78.0 - 100.0 fL   MCH 31.0 26.0 - 34.0 pg   MCHC 33.5 30.0 - 36.0 g/dL   RDW 14.2 11.5 - 15.5 %   Platelets PENDING 150 - 400 K/uL   No results found.  Review of Systems  Constitutional: Negative for fever.  Gastrointestinal: Positive for abdominal pain.   Physical Exam   Blood pressure 128/62, pulse 73, temperature 98.1 F (36.7 C), resp. rate 24, height 5' 4"  (1.626 m), weight 181.439 kg (400 lb), last menstrual period 11/10/2014, SpO2 100 %.  Physical Exam  Constitutional: She is oriented to person, place, and time. No distress.  Eyes: Pupils are equal, round, and reactive to light.  Neck:  Tracheostomy in place.   Respiratory: Effort normal. No respiratory distress. She has no wheezes.  Neurological: She is alert and oriented to person, place, and time.  Skin: Skin is warm. She is not diaphoretic.  Psychiatric: Her behavior is normal.    MAU Course  Procedures  None  MDM Unable to examine the patient's bleeding due to body habitus.  Discussed labs and HPI with Dr. Cletis Media.   Assessment and Plan   A:  1. Abnormal  vaginal bleeding    P:  Discharge home in stable condition RX: Aygestin Following up with Dr. Raphael Gibney; call the office today to schedule an appointment Bleeding precautions Return to MAU if symptoms worsen   Lezlie Lye, NP 01/10/2015 2:48 PM

## 2015-01-10 NOTE — Discharge Instructions (Signed)
Abnormal Uterine Bleeding Abnormal uterine bleeding can affect women at various stages in life, including teenagers, women in their reproductive years, pregnant women, and women who have reached menopause. Several kinds of uterine bleeding are considered abnormal, including:  Bleeding or spotting between periods.   Bleeding after sexual intercourse.   Bleeding that is heavier or more than normal.   Periods that last longer than usual.  Bleeding after menopause.  Many cases of abnormal uterine bleeding are minor and simple to treat, while others are more serious. Any type of abnormal bleeding should be evaluated by your health care provider. Treatment will depend on the cause of the bleeding. HOME CARE INSTRUCTIONS Monitor your condition for any changes. The following actions may help to alleviate any discomfort you are experiencing:  Avoid the use of tampons and douches as directed by your health care provider.  Change your pads frequently. You should get regular pelvic exams and Pap tests. Keep all follow-up appointments for diagnostic tests as directed by your health care provider.  SEEK MEDICAL CARE IF:   Your bleeding lasts more than 1 week.   You feel dizzy at times.  SEEK IMMEDIATE MEDICAL CARE IF:   You pass out.   You are changing pads every 15 to 30 minutes.   You have abdominal pain.  You have a fever.   You become sweaty or weak.   You are passing large blood clots from the vagina.   You start to feel nauseous and vomit. MAKE SURE YOU:   Understand these instructions.  Will watch your condition.  Will get help right away if you are not doing well or get worse. Document Released: 06/11/2005 Document Revised: 06/16/2013 Document Reviewed: 01/08/2013 Exit[employer] Patient Information 2015 [employer]. This information is not intended to replace advice given to you by your health care provider. Make sure you discuss any questions you have with your  health care provider.  

## 2015-01-10 NOTE — MAU Note (Signed)
Pt here for bleeding for 2 months, was seen in office last Thursday with DR. Stringer.  Bleeding has not gotten better. Called office to speak with nurse, has not received call back.  Pt complains of dizziness, nausea, and loose stool. Called Dr. Cletis Media, she asked for NP to see pt and give her a call.

## 2015-01-11 ENCOUNTER — Ambulatory Visit: Payer: Medicare Other | Admitting: Dietician

## 2015-01-12 ENCOUNTER — Other Ambulatory Visit: Payer: Self-pay | Admitting: Obstetrics and Gynecology

## 2015-01-12 NOTE — Anesthesia Preprocedure Evaluation (Addendum)
Anesthesia Evaluation  Patient identified by MRN, date of birth, ID band Patient awake    Reviewed: Allergy & Precautions, NPO status   History of Anesthesia Complications (+) PONV  Airway Mallampati: Trach       Dental  (+) Dental Advisory Given   Pulmonary shortness of breath and lying, asthma , sleep apnea ,  TRACH for obesity hypoventilation and severe OSA breath sounds clear to auscultation        Cardiovascular hypertension, Pt. on medications + angina +CHF (Diastolic) and DVT Rhythm:Regular  ECHO 2009 EF 60%, CXR with cardiomegaly   Neuro/Psych Anxiety Depression  Neuromuscular disease    GI/Hepatic GERD-  Medicated,Hepato spleenomegaly, likely fatty liver   Endo/Other  diabetes, Type 2Morbid obesity (BMI 68)  Renal/GU Creat 1.03     Musculoskeletal   Abdominal (+) + obese,   Peds  Hematology 12/36   Anesthesia Other Findings   Reproductive/Obstetrics                           Anesthesia Physical Anesthesia Plan  ASA: IV  Anesthesia Plan: General   Post-op Pain Management:    Induction: Intravenous  Airway Management Planned: Tracheostomy  Additional Equipment:   Intra-op Plan:   Post-operative Plan: Extubation in OR  Informed Consent: I have reviewed the patients History and Physical, chart, labs and discussed the procedure including the risks, benefits and alternatives for the proposed anesthesia with the patient or authorized representative who has indicated his/her understanding and acceptance.     Plan Discussed with:   Anesthesia Plan Comments: (minimize narcotics, recover sitting up,  SEVO via trach, propofol and Ketamine, try to avoid relaxants, will probably insert 6.5 - 7 ETT through trach orifice, IV lidocaine, Blood sugar 356, will give Insulin and re evaluate   )      Anesthesia Quick Evaluation

## 2015-01-17 ENCOUNTER — Encounter (HOSPITAL_COMMUNITY)
Admission: RE | Disposition: A | Discharge status: Home / Self Care | Payer: Self-pay | Source: Ambulatory Visit | Attending: Obstetrics and Gynecology

## 2015-01-17 ENCOUNTER — Ambulatory Visit (HOSPITAL_COMMUNITY): Payer: Medicare Other | Admitting: Anesthesiology

## 2015-01-17 ENCOUNTER — Observation Stay (HOSPITAL_COMMUNITY)
Admission: RE | Admit: 2015-01-17 | Discharge: 2015-01-18 | Disposition: A | Discharge status: Home / Self Care | Payer: Medicare Other | Source: Ambulatory Visit | Attending: Obstetrics and Gynecology | Admitting: Obstetrics and Gynecology

## 2015-01-17 DIAGNOSIS — E669 Obesity, unspecified: Secondary | ICD-10-CM

## 2015-01-17 DIAGNOSIS — Z7901 Long term (current) use of anticoagulants: Secondary | ICD-10-CM | POA: Insufficient documentation

## 2015-01-17 DIAGNOSIS — Z794 Long term (current) use of insulin: Secondary | ICD-10-CM | POA: Insufficient documentation

## 2015-01-17 DIAGNOSIS — E785 Hyperlipidemia, unspecified: Secondary | ICD-10-CM | POA: Diagnosis not present

## 2015-01-17 DIAGNOSIS — G4733 Obstructive sleep apnea (adult) (pediatric): Secondary | ICD-10-CM | POA: Insufficient documentation

## 2015-01-17 DIAGNOSIS — M1990 Unspecified osteoarthritis, unspecified site: Secondary | ICD-10-CM | POA: Insufficient documentation

## 2015-01-17 DIAGNOSIS — N189 Chronic kidney disease, unspecified: Secondary | ICD-10-CM | POA: Insufficient documentation

## 2015-01-17 DIAGNOSIS — I10 Essential (primary) hypertension: Secondary | ICD-10-CM

## 2015-01-17 DIAGNOSIS — Z6841 Body Mass Index (BMI) 40.0 and over, adult: Secondary | ICD-10-CM | POA: Diagnosis not present

## 2015-01-17 DIAGNOSIS — I509 Heart failure, unspecified: Secondary | ICD-10-CM | POA: Insufficient documentation

## 2015-01-17 DIAGNOSIS — E119 Type 2 diabetes mellitus without complications: Secondary | ICD-10-CM | POA: Diagnosis not present

## 2015-01-17 DIAGNOSIS — Z882 Allergy status to sulfonamides status: Secondary | ICD-10-CM | POA: Diagnosis not present

## 2015-01-17 DIAGNOSIS — Z86718 Personal history of other venous thrombosis and embolism: Secondary | ICD-10-CM | POA: Insufficient documentation

## 2015-01-17 DIAGNOSIS — Z885 Allergy status to narcotic agent status: Secondary | ICD-10-CM | POA: Diagnosis not present

## 2015-01-17 DIAGNOSIS — M109 Gout, unspecified: Secondary | ICD-10-CM | POA: Insufficient documentation

## 2015-01-17 DIAGNOSIS — F419 Anxiety disorder, unspecified: Secondary | ICD-10-CM | POA: Diagnosis not present

## 2015-01-17 DIAGNOSIS — J9610 Chronic respiratory failure, unspecified whether with hypoxia or hypercapnia: Secondary | ICD-10-CM | POA: Diagnosis present

## 2015-01-17 DIAGNOSIS — N950 Postmenopausal bleeding: Secondary | ICD-10-CM | POA: Diagnosis present

## 2015-01-17 DIAGNOSIS — N939 Abnormal uterine and vaginal bleeding, unspecified: Secondary | ICD-10-CM

## 2015-01-17 DIAGNOSIS — G629 Polyneuropathy, unspecified: Secondary | ICD-10-CM | POA: Insufficient documentation

## 2015-01-17 DIAGNOSIS — Z930 Tracheostomy status: Secondary | ICD-10-CM | POA: Diagnosis not present

## 2015-01-17 DIAGNOSIS — I129 Hypertensive chronic kidney disease with stage 1 through stage 4 chronic kidney disease, or unspecified chronic kidney disease: Secondary | ICD-10-CM | POA: Diagnosis not present

## 2015-01-17 DIAGNOSIS — J45909 Unspecified asthma, uncomplicated: Secondary | ICD-10-CM | POA: Insufficient documentation

## 2015-01-17 DIAGNOSIS — Z79899 Other long term (current) drug therapy: Secondary | ICD-10-CM | POA: Diagnosis not present

## 2015-01-17 DIAGNOSIS — R32 Unspecified urinary incontinence: Secondary | ICD-10-CM | POA: Diagnosis not present

## 2015-01-17 DIAGNOSIS — Z881 Allergy status to other antibiotic agents status: Secondary | ICD-10-CM | POA: Insufficient documentation

## 2015-01-17 DIAGNOSIS — Z888 Allergy status to other drugs, medicaments and biological substances status: Secondary | ICD-10-CM | POA: Diagnosis not present

## 2015-01-17 DIAGNOSIS — I5032 Chronic diastolic (congestive) heart failure: Secondary | ICD-10-CM | POA: Diagnosis present

## 2015-01-17 DIAGNOSIS — C530 Malignant neoplasm of endocervix: Principal | ICD-10-CM | POA: Insufficient documentation

## 2015-01-17 DIAGNOSIS — K219 Gastro-esophageal reflux disease without esophagitis: Secondary | ICD-10-CM | POA: Diagnosis not present

## 2015-01-17 DIAGNOSIS — F329 Major depressive disorder, single episode, unspecified: Secondary | ICD-10-CM | POA: Diagnosis not present

## 2015-01-17 DIAGNOSIS — E1169 Type 2 diabetes mellitus with other specified complication: Secondary | ICD-10-CM

## 2015-01-17 HISTORY — PX: INTRAUTERINE DEVICE (IUD) INSERTION: SHX5877

## 2015-01-17 HISTORY — PX: DILATATION & CURRETTAGE/HYSTEROSCOPY WITH RESECTOCOPE: SHX5572

## 2015-01-17 LAB — COMPREHENSIVE METABOLIC PANEL
ALBUMIN: 3.4 g/dL — AB (ref 3.5–5.0)
ALT: 25 U/L (ref 14–54)
ANION GAP: 6 (ref 5–15)
AST: 26 U/L (ref 15–41)
Alkaline Phosphatase: 45 U/L (ref 38–126)
BUN: 11 mg/dL (ref 6–20)
CHLORIDE: 101 mmol/L (ref 101–111)
CO2: 27 mmol/L (ref 22–32)
CREATININE: 1.1 mg/dL — AB (ref 0.44–1.00)
Calcium: 8 mg/dL — ABNORMAL LOW (ref 8.9–10.3)
GFR calc Af Amer: >60 mL/min (ref >60)
GFR calc non Af Amer: 57 mL/min — ABNORMAL LOW (ref >60)
GLUCOSE: 274 mg/dL — AB (ref 65–99)
Potassium: 4 mmol/L (ref 3.5–5.1)
Sodium: 134 mmol/L — ABNORMAL LOW (ref 135–145)
TOTAL PROTEIN: 7 g/dL (ref 6.5–8.1)
Total Bilirubin: 0.7 mg/dL (ref 0.3–1.2)

## 2015-01-17 LAB — GLUCOSE, CAPILLARY
GLUCOSE-CAPILLARY: 277 mg/dL — AB (ref 65–99)
GLUCOSE-CAPILLARY: 303 mg/dL — AB (ref 65–99)
GLUCOSE-CAPILLARY: 247 mg/dL — AB (ref 65–99)
Glucose-Capillary: 356 mg/dL — ABNORMAL HIGH (ref 65–99)
Glucose-Capillary: 257 mg/dL — ABNORMAL HIGH (ref 65–99)
Glucose-Capillary: 284 mg/dL — ABNORMAL HIGH (ref 65–99)
Glucose-Capillary: 294 mg/dL — ABNORMAL HIGH (ref 65–99)

## 2015-01-17 LAB — PROTIME-INR
INR: 1.25 (ref 0.00–1.49)
Prothrombin Time: 15.8 seconds — ABNORMAL HIGH (ref 11.6–15.2)

## 2015-01-17 SURGERY — DILATATION & CURETTAGE/HYSTEROSCOPY WITH RESECTOCOPE
Anesthesia: General

## 2015-01-17 MED ORDER — MECLIZINE HCL 25 MG PO TABS
25.0000 mg | ORAL_TABLET | Freq: Every day | ORAL | Status: DC
  Administered 2015-01-17: 25 mg via ORAL
  Filled 2015-01-17 (×2): qty 1

## 2015-01-17 MED ORDER — KETAMINE HCL 10 MG/ML IJ SOLN
INTRAMUSCULAR | Status: DC | PRN
  Administered 2015-01-17: 25 mg via INTRAVENOUS

## 2015-01-17 MED ORDER — INSULIN REGULAR HUMAN 100 UNIT/ML IJ SOLN: 10.0000 [IU] | Freq: Once | INTRAMUSCULAR | Status: DC

## 2015-01-17 MED ORDER — POTASSIUM CHLORIDE CRYS ER 20 MEQ PO TBCR
40.0000 meq | EXTENDED_RELEASE_TABLET | ORAL | Status: DC
  Administered 2015-01-17 – 2015-01-18 (×2): 40 meq via ORAL
  Filled 2015-01-17 (×4): qty 2

## 2015-01-17 MED ORDER — MEPERIDINE HCL 25 MG/ML IJ SOLN: 6.2500 mg | INTRAMUSCULAR | Status: DC | PRN

## 2015-01-17 MED ORDER — BUDESONIDE 0.25 MG/2ML IN SUSP
0.2500 mg | Freq: Two times a day (BID) | RESPIRATORY_TRACT | Status: DC | PRN
  Filled 2015-01-17: qty 2

## 2015-01-17 MED ORDER — PROPOFOL 10 MG/ML IV BOLUS
INTRAVENOUS | Status: AC
Start: 2015-01-17 — End: 2015-01-17
  Filled 2015-01-17: qty 20

## 2015-01-17 MED ORDER — SIMETHICONE 80 MG PO CHEW: 80.0000 mg | CHEWABLE_TABLET | Freq: Four times a day (QID) | ORAL | Status: DC | PRN

## 2015-01-17 MED ORDER — LIDOCAINE HCL (CARDIAC) 20 MG/ML IV SOLN
INTRAVENOUS | Status: AC
  Filled 2015-01-17: qty 5

## 2015-01-17 MED ORDER — INSULIN ASPART 100 UNIT/ML ~~LOC~~ SOLN
0.0000 [IU] | Freq: Three times a day (TID) | SUBCUTANEOUS | Status: DC
  Administered 2015-01-17: 15 [IU] via SUBCUTANEOUS
  Administered 2015-01-18 (×2): 7 [IU] via SUBCUTANEOUS

## 2015-01-17 MED ORDER — GABAPENTIN 100 MG PO CAPS
200.0000 mg | ORAL_CAPSULE | Freq: Every day | ORAL | Status: DC
  Administered 2015-01-17: 200 mg via ORAL
  Filled 2015-01-17 (×2): qty 2

## 2015-01-17 MED ORDER — SCOPOLAMINE 1 MG/3DAYS TD PT72
MEDICATED_PATCH | TRANSDERMAL | Status: DC
Start: 2015-01-17 — End: 2015-01-18
  Administered 2015-01-17: 1.5 mg via TRANSDERMAL
  Filled 2015-01-17: qty 1

## 2015-01-17 MED ORDER — OXYCODONE-ACETAMINOPHEN 5-325 MG PO TABS
1.0000 | ORAL_TABLET | ORAL | Status: DC | PRN
  Administered 2015-01-18 (×2): 1 via ORAL
  Filled 2015-01-17 (×2): qty 1

## 2015-01-17 MED ORDER — DULOXETINE HCL 30 MG PO CPEP
30.0000 mg | ORAL_CAPSULE | Freq: Every day | ORAL | Status: DC
  Filled 2015-01-17: qty 1

## 2015-01-17 MED ORDER — ONDANSETRON HCL 4 MG/2ML IJ SOLN: 4.0000 mg | Freq: Four times a day (QID) | INTRAMUSCULAR | Status: DC | PRN

## 2015-01-17 MED ORDER — FESOTERODINE FUMARATE ER 4 MG PO TB24
4.0000 mg | ORAL_TABLET | Freq: Every day | ORAL | Status: DC
  Administered 2015-01-17 – 2015-01-18 (×2): 4 mg via ORAL
  Filled 2015-01-17 (×3): qty 1

## 2015-01-17 MED ORDER — HYDRALAZINE HCL 20 MG/ML IJ SOLN: 5.0000 mg | INTRAMUSCULAR | Status: DC | PRN

## 2015-01-17 MED ORDER — CETYLPYRIDINIUM CHLORIDE 0.05 % MT LIQD: 7.0000 mL | Freq: Two times a day (BID) | OROMUCOSAL | Status: DC

## 2015-01-17 MED ORDER — INSULIN REGULAR HUMAN 100 UNIT/ML IJ SOLN: 15.0000 [IU] | Freq: Once | INTRAMUSCULAR | Status: DC

## 2015-01-17 MED ORDER — WARFARIN - PHYSICIAN DOSING INPATIENT
Freq: Every day | Status: DC
  Administered 2015-01-17: 18:00:00

## 2015-01-17 MED ORDER — KETAMINE HCL 10 MG/ML IJ SOLN
INTRAMUSCULAR | Status: AC
  Filled 2015-01-17: qty 1

## 2015-01-17 MED ORDER — PROMETHAZINE HCL 25 MG PO TABS: 25.0000 mg | ORAL_TABLET | Freq: Four times a day (QID) | ORAL | Status: DC | PRN

## 2015-01-17 MED ORDER — NEBIVOLOL HCL 10 MG PO TABS
10.0000 mg | ORAL_TABLET | Freq: Every day | ORAL | Status: DC
Start: 2015-01-17 — End: 2015-01-18
  Administered 2015-01-17 – 2015-01-18 (×2): 10 mg via ORAL
  Filled 2015-01-17 (×3): qty 1

## 2015-01-17 MED ORDER — HEPARIN SODIUM (PORCINE) 5000 UNIT/ML IJ SOLN
5000.0000 [IU] | Freq: Three times a day (TID) | INTRAMUSCULAR | Status: DC
  Administered 2015-01-17 – 2015-01-18 (×4): 5000 [IU] via SUBCUTANEOUS
  Filled 2015-01-17 (×6): qty 1

## 2015-01-17 MED ORDER — POTASSIUM CHLORIDE CRYS ER 20 MEQ PO TBCR: 20.0000 meq | EXTENDED_RELEASE_TABLET | Freq: Four times a day (QID) | ORAL | Status: DC

## 2015-01-17 MED ORDER — FENTANYL CITRATE (PF) 100 MCG/2ML IJ SOLN
25.0000 ug | INTRAMUSCULAR | Status: DC | PRN
  Administered 2015-01-17 (×3): 50 ug via INTRAVENOUS

## 2015-01-17 MED ORDER — POTASSIUM CHLORIDE CRYS ER 20 MEQ PO TBCR
20.0000 meq | EXTENDED_RELEASE_TABLET | Freq: Two times a day (BID) | ORAL | Status: DC
  Administered 2015-01-17 – 2015-01-18 (×2): 20 meq via ORAL
  Filled 2015-01-17 (×4): qty 1

## 2015-01-17 MED ORDER — PHENYLEPHRINE HCL 10 MG/ML IJ SOLN
INTRAMUSCULAR | Status: DC | PRN
  Administered 2015-01-17 (×2): 80 ug via INTRAVENOUS

## 2015-01-17 MED ORDER — ALBUTEROL SULFATE (2.5 MG/3ML) 0.083% IN NEBU: 2.5000 mg | INHALATION_SOLUTION | RESPIRATORY_TRACT | Status: DC | PRN

## 2015-01-17 MED ORDER — INSULIN REGULAR HUMAN 100 UNIT/ML IJ SOLN
15.0000 [IU] | Freq: Once | INTRAMUSCULAR | Status: AC
  Administered 2015-01-17: 15 [IU] via INTRAVENOUS
  Filled 2015-01-17: qty 1
  Filled 2015-01-17: qty 0.15

## 2015-01-17 MED ORDER — BUPIVACAINE-EPINEPHRINE (PF) 0.5% -1:200000 IJ SOLN
INTRAMUSCULAR | Status: AC
Start: 2015-01-17 — End: 2015-01-17
  Filled 2015-01-17: qty 30

## 2015-01-17 MED ORDER — METOLAZONE 5 MG PO TABS
5.0000 mg | ORAL_TABLET | Freq: Every day | ORAL | Status: DC
  Administered 2015-01-17: 5 mg via ORAL
  Filled 2015-01-17 (×3): qty 1

## 2015-01-17 MED ORDER — HEPARIN SODIUM (PORCINE) 5000 UNIT/ML IJ SOLN
INTRAMUSCULAR | Status: AC
  Filled 2015-01-17: qty 1

## 2015-01-17 MED ORDER — COLCHICINE 0.6 MG PO TABS
0.6000 mg | ORAL_TABLET | Freq: Every day | ORAL | Status: DC
  Administered 2015-01-18: 0.6 mg via ORAL
  Filled 2015-01-17 (×2): qty 1

## 2015-01-17 MED ORDER — PROPOFOL 10 MG/ML IV BOLUS
INTRAVENOUS | Status: DC | PRN
  Administered 2015-01-17: 25 mg via INTRAVENOUS

## 2015-01-17 MED ORDER — GABAPENTIN 100 MG PO CAPS
100.0000 mg | ORAL_CAPSULE | Freq: Four times a day (QID) | ORAL | Status: DC
  Administered 2015-01-17: 100 mg via ORAL
  Filled 2015-01-17 (×4): qty 1

## 2015-01-17 MED ORDER — DEXTROSE IN LACTATED RINGERS 5 % IV SOLN
INTRAVENOUS | Status: DC
Start: 2015-01-17 — End: 2015-01-17

## 2015-01-17 MED ORDER — BUSPIRONE HCL 5 MG PO TABS
7.5000 mg | ORAL_TABLET | Freq: Two times a day (BID) | ORAL | Status: DC
  Administered 2015-01-17 – 2015-01-18 (×2): 7.5 mg via ORAL
  Filled 2015-01-17 (×4): qty 1.5

## 2015-01-17 MED ORDER — ONDANSETRON HCL 4 MG PO TABS: 4.0000 mg | ORAL_TABLET | Freq: Four times a day (QID) | ORAL | Status: DC | PRN

## 2015-01-17 MED ORDER — SCOPOLAMINE 1 MG/3DAYS TD PT72
1.0000 | MEDICATED_PATCH | Freq: Once | TRANSDERMAL | Status: DC
  Administered 2015-01-17: 1.5 mg via TRANSDERMAL

## 2015-01-17 MED ORDER — GABAPENTIN 100 MG PO CAPS
100.0000 mg | ORAL_CAPSULE | Freq: Three times a day (TID) | ORAL | Status: DC
  Administered 2015-01-18 (×2): 100 mg via ORAL
  Filled 2015-01-17 (×5): qty 1

## 2015-01-17 MED ORDER — IPRATROPIUM BROMIDE 0.02 % IN SOLN
0.5000 mg | Freq: Four times a day (QID) | RESPIRATORY_TRACT | Status: DC | PRN
  Filled 2015-01-17: qty 2.5

## 2015-01-17 MED ORDER — INSULIN REGULAR HUMAN (CONC) 500 UNIT/ML ~~LOC~~ SOLN
110.0000 [IU] | Freq: Every day | SUBCUTANEOUS | Status: DC
  Administered 2015-01-18: 110 [IU] via SUBCUTANEOUS
  Filled 2015-01-17 (×2): qty 20

## 2015-01-17 MED ORDER — ALPRAZOLAM 0.5 MG PO TABS: 0.5000 mg | ORAL_TABLET | Freq: Two times a day (BID) | ORAL | Status: DC | PRN

## 2015-01-17 MED ORDER — BUMETANIDE 2 MG PO TABS
2.0000 mg | ORAL_TABLET | Freq: Every day | ORAL | Status: DC
  Filled 2015-01-17: qty 1

## 2015-01-17 MED ORDER — FENTANYL CITRATE (PF) 100 MCG/2ML IJ SOLN
INTRAMUSCULAR | Status: AC
  Filled 2015-01-17: qty 2

## 2015-01-17 MED ORDER — CHLORHEXIDINE GLUCONATE 0.12 % MT SOLN
15.0000 mL | Freq: Two times a day (BID) | OROMUCOSAL | Status: DC
  Administered 2015-01-17 – 2015-01-18 (×2): 15 mL via OROMUCOSAL
  Filled 2015-01-17 (×4): qty 15

## 2015-01-17 MED ORDER — GUAIFENESIN ER 600 MG PO TB12
600.0000 mg | ORAL_TABLET | Freq: Two times a day (BID) | ORAL | Status: DC
  Administered 2015-01-17 – 2015-01-18 (×2): 600 mg via ORAL
  Filled 2015-01-17 (×4): qty 1

## 2015-01-17 MED ORDER — PROMETHAZINE HCL 25 MG/ML IJ SOLN: 6.2500 mg | INTRAMUSCULAR | Status: DC | PRN

## 2015-01-17 MED ORDER — LACTATED RINGERS IV SOLN
INTRAVENOUS | Status: DC
  Administered 2015-01-17 (×2): via INTRAVENOUS

## 2015-01-17 MED ORDER — FEBUXOSTAT 40 MG PO TABS
80.0000 mg | ORAL_TABLET | Freq: Every day | ORAL | Status: DC
  Administered 2015-01-17: 80 mg via ORAL
  Filled 2015-01-17 (×2): qty 2

## 2015-01-17 MED ORDER — INSULIN REGULAR HUMAN 100 UNIT/ML IJ SOLN
10.0000 [IU] | Freq: Once | INTRAMUSCULAR | Status: AC
  Administered 2015-01-17: 10 [IU] via SUBCUTANEOUS
  Filled 2015-01-17: qty 0.1

## 2015-01-17 MED ORDER — DULOXETINE HCL 30 MG PO CPEP
30.0000 mg | ORAL_CAPSULE | Freq: Every day | ORAL | Status: DC
  Administered 2015-01-17 – 2015-01-18 (×2): 30 mg via ORAL
  Filled 2015-01-17 (×3): qty 1

## 2015-01-17 MED ORDER — FESOTERODINE FUMARATE ER 4 MG PO TB24
4.0000 mg | ORAL_TABLET | Freq: Every day | ORAL | Status: DC
  Filled 2015-01-17: qty 1

## 2015-01-17 MED ORDER — METOLAZONE 5 MG PO TABS
5.0000 mg | ORAL_TABLET | Freq: Every day | ORAL | Status: DC
  Filled 2015-01-17: qty 1

## 2015-01-17 MED ORDER — PHENYLEPHRINE 40 MCG/ML (10ML) SYRINGE FOR IV PUSH (FOR BLOOD PRESSURE SUPPORT)
PREFILLED_SYRINGE | INTRAVENOUS | Status: AC
  Filled 2015-01-17: qty 10

## 2015-01-17 MED ORDER — ATORVASTATIN CALCIUM 10 MG PO TABS
10.0000 mg | ORAL_TABLET | Freq: Every day | ORAL | Status: DC
  Administered 2015-01-17: 10 mg via ORAL
  Filled 2015-01-17 (×2): qty 1

## 2015-01-17 MED ORDER — INSULIN REGULAR HUMAN (CONC) 500 UNIT/ML ~~LOC~~ SOLN
40.0000 [IU] | Freq: Every day | SUBCUTANEOUS | Status: DC
  Administered 2015-01-17: 40 [IU] via SUBCUTANEOUS
  Filled 2015-01-17: qty 20

## 2015-01-17 MED ORDER — INSULIN REGULAR HUMAN (CONC) 500 UNIT/ML ~~LOC~~ SOLN: 8.0000 [IU] | Freq: Two times a day (BID) | SUBCUTANEOUS | Status: DC

## 2015-01-17 MED ORDER — LIRAGLUTIDE 18 MG/3ML ~~LOC~~ SOPN
1.8000 mg | PEN_INJECTOR | Freq: Every day | SUBCUTANEOUS | Status: DC
  Administered 2015-01-17 – 2015-01-18 (×2): 1.8 mg via SUBCUTANEOUS

## 2015-01-17 MED ORDER — BUMETANIDE 2 MG PO TABS
2.0000 mg | ORAL_TABLET | Freq: Every day | ORAL | Status: DC
  Administered 2015-01-17 – 2015-01-18 (×2): 2 mg via ORAL
  Filled 2015-01-17 (×3): qty 1

## 2015-01-17 MED ORDER — EPHEDRINE 5 MG/ML INJ
INTRAVENOUS | Status: AC
  Filled 2015-01-17: qty 10

## 2015-01-17 MED ORDER — WARFARIN SODIUM 5 MG PO TABS
5.0000 mg | ORAL_TABLET | Freq: Every day | ORAL | Status: DC
  Administered 2015-01-17: 5 mg via ORAL
  Filled 2015-01-17 (×2): qty 1

## 2015-01-17 MED ORDER — NEBIVOLOL HCL 10 MG PO TABS
10.0000 mg | ORAL_TABLET | Freq: Every day | ORAL | Status: DC
  Filled 2015-01-17: qty 1

## 2015-01-17 MED ORDER — LIDOCAINE HCL (CARDIAC) 20 MG/ML IV SOLN
INTRAVENOUS | Status: DC | PRN
  Administered 2015-01-17: 100 mg via INTRAVENOUS

## 2015-01-17 MED ORDER — PRIMIDONE 50 MG PO TABS
50.0000 mg | ORAL_TABLET | Freq: Two times a day (BID) | ORAL | Status: DC
  Administered 2015-01-17 – 2015-01-18 (×2): 50 mg via ORAL
  Filled 2015-01-17 (×4): qty 1

## 2015-01-17 MED ORDER — FLUTICASONE PROPIONATE 50 MCG/ACT NA SUSP
1.0000 | Freq: Every day | NASAL | Status: DC
  Filled 2015-01-17: qty 16

## 2015-01-17 MED ORDER — EPHEDRINE SULFATE 50 MG/ML IJ SOLN
INTRAMUSCULAR | Status: DC | PRN
  Administered 2015-01-17 (×4): 10 mg via INTRAVENOUS

## 2015-01-17 MED ORDER — INSULIN REGULAR HUMAN 100 UNIT/ML IJ SOLN
10.0000 [IU] | Freq: Once | INTRAMUSCULAR | Status: AC
  Administered 2015-01-17: 10 [IU] via INTRAVENOUS
  Filled 2015-01-17: qty 0.1
  Filled 2015-01-17: qty 1

## 2015-01-17 MED ORDER — INSULIN ASPART 100 UNIT/ML ~~LOC~~ SOLN
0.0000 [IU] | Freq: Every day | SUBCUTANEOUS | Status: DC
  Administered 2015-01-17: 3 [IU] via SUBCUTANEOUS

## 2015-01-17 SURGICAL SUPPLY — 17 items
CANISTER SUCT 3000ML (MISCELLANEOUS) ×3 IMPLANT
CATH ROBINSON RED A/P 16FR (CATHETERS) ×3 IMPLANT
CLOTH BEACON ORANGE TIMEOUT ST (SAFETY) ×3 IMPLANT
CONTAINER PREFILL 10% NBF 60ML (FORM) ×6 IMPLANT
ELECT REM PT RETURN 9FT ADLT (ELECTROSURGICAL)
ELECTRODE REM PT RTRN 9FT ADLT (ELECTROSURGICAL) IMPLANT
GLOVE BIOGEL PI IND STRL 8.5 (GLOVE) ×1 IMPLANT
GLOVE BIOGEL PI INDICATOR 8.5 (GLOVE) ×2
GLOVE ECLIPSE 8.0 STRL XLNG CF (GLOVE) ×6 IMPLANT
GOWN STRL REUS W/TWL LRG LVL3 (GOWN DISPOSABLE) ×6 IMPLANT
LOOP ANGLED CUTTING 22FR (CUTTING LOOP) IMPLANT
PACK VAGINAL MINOR WOMEN LF (CUSTOM PROCEDURE TRAY) ×3 IMPLANT
PAD OB MATERNITY 4.3X12.25 (PERSONAL CARE ITEMS) ×3 IMPLANT
TOWEL OR 17X24 6PK STRL BLUE (TOWEL DISPOSABLE) ×6 IMPLANT
TUBING AQUILEX INFLOW (TUBING) ×3 IMPLANT
TUBING AQUILEX OUTFLOW (TUBING) ×3 IMPLANT
WATER STERILE IRR 1000ML POUR (IV SOLUTION) ×3 IMPLANT

## 2015-01-17 NOTE — Transfer of Care (Signed)
Immediate Anesthesia Transfer of Care Note  Patient: [name]  Procedure(s) Performed: Procedure(s): DILATATION & CURETTAGE/HYSTEROSCOPY WITH RESECTOCOPE; Pap Smear (N/A) INTRAUTERINE DEVICE (IUD) INSERTION (N/A)  Patient Location: PACU  Anesthesia Type:General  Level of Consciousness: awake  Airway & Oxygen Therapy: Patient Spontanous Breathing  Post-op Assessment: Report given to PACU RN  Post vital signs: stable  Filed Vitals:   01/17/15 1138  BP: 103/49  Pulse: 67  Temp: 36.9 C  Resp: 26    Complications: No apparent anesthesia complications

## 2015-01-17 NOTE — Anesthesia Postprocedure Evaluation (Signed)
  Anesthesia Post-op Note  Patient: [name]  Procedure(s) Performed: Procedure(s): DILATATION & CURETTAGE/HYSTEROSCOPY WITH RESECTOCOPE; Pap Smear (N/A) INTRAUTERINE DEVICE (IUD) INSERTION (N/A)  Patient Location: PACU  Anesthesia Type:General  Level of Consciousness: awake  Airway and Oxygen Therapy: Patient Spontanous Breathing and Patient connected to tracheostomy mask oxygen  Post-op Pain: none  Post-op Assessment: Post-op Vital signs reviewed, Patient's Cardiovascular Status Stable, Respiratory Function Stable, Patent Airway and No signs of Nausea or vomiting              Post-op Vital Signs: Reviewed and stable  Last Vitals:  Filed Vitals:   01/17/15 1138  BP: 103/49  Pulse: 67  Temp: 36.9 C  Resp: 26    Complications: No apparent anesthesia complications

## 2015-01-17 NOTE — Consult Note (Signed)
[facility] Medical Consultation  [name] OTR:[id number] DOB: April 23, 1961 DOA: 01/17/2015 PCP: [name], MD   Requesting physician: Dr. Raphael Gibney Date of consultation: 01/17/2015 Reason for consultation: Medical Management  Impression/Recommendations Active Problems:   Morbid obesity   Diabetes mellitus type 2 in obese   HTN (hypertension), benign   Tracheostomy dependence   Chronic respiratory failure   Chronic diastolic heart failure   Vaginal bleeding    1. DM2 1. Random glucose noted to be in the 300's 2. Pt on U500 insulin per home med rec 3. Most recent a1c from 07/11/14 noted to be 7.2 4. Will add SSI coverage 5. Currently on dextrose containing fluids - will stop 6. Continue to adjust insulin dose as needed 2. HTN 1. BP documented to be low, however question accuracy of BP 2. Would recommend documenting manual cuff pressure 3. Agree with continuing home bp meds 4. Will add PRN hydralazine for sbp>160 3. Chronic diastolic CHF 1. Clinically euvolemic at present 2. Will hold off on basal IVF for now as pt has diet ordered 3. Dry weight 190.5kg (420lbs) 4. Recommend Daily weights, I/O 5. Most recent 2d echo from 2010 with EF of 50-55% 4. Chronic respiratory failure 1. 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 Vent at bedside 5. Morbid obesity 1. stable 6. Vaginal bleeding 1. Deferred to primary service 7. Hx of DVT 1. On coumadin, INR 1.2 today 2. Pt reports DVT diagnosed in 1988 and had been on therapeutic coumadin since 3. Question if pt truly requires therapeutic anticoagulation by now? 4. Will defer to PCP who is managing INR as outpatient 5. For now, agree with conservatively resuming anticoagulation as tolerated without bridge, especially given recent bleeding issues and recent surgery 8. Chronic anticoagulation 1. Therapeutic coumadin per above  I will followup again tomorrow. Please contact me if I  can be of assistance in the meanwhile. Thank you for this consultation.  Chief Complaint: Vaginal bleeding  HPI:  54yo with hx of morbid obesity, HTN, DM2, obesity hypoventillation syndrome with chronic respiratory failure, trach dependent, hx DVT on therapeutic coumadin who is currently admitted to [facility] center for hysteroscopy, D and C, and placement of Mirena UID with Pap. Patient noted to have labile blood pressures during procedure as well as poorly controlled glucose. Hospitalist service consulted to assist with medical management  Review of Systems:  Review of Systems  Constitutional: Negative for fever and chills.  HENT: Negative for ear discharge, ear pain and hearing loss.   Eyes: Negative for pain and discharge.  Respiratory: Negative for hemoptysis, sputum production and wheezing.   Gastrointestinal: Positive for nausea. Negative for vomiting and diarrhea.  Genitourinary: Negative for urgency and frequency.  Musculoskeletal: Negative for back pain and neck pain.  Neurological: Negative for tingling, tremors and loss of consciousness.  Psychiatric/Behavioral: Negative for hallucinations and memory loss.     Past Medical History  Diagnosis Date  . OSA (obstructive sleep apnea)   . Morbid obesity   . Depressive disorder, not elsewhere classified   . Diabetes mellitus   . Asthma   . Dyslipidemia   . Clostridium difficile enterocolitis 2012  . Splenomegaly 06/01/2011  . Thrombocytopenia 06/01/2011  . Hepatosplenomegaly   . Gout   . GERD (gastroesophageal reflux disease)   . Osteoarthritis   . Polyneuropathy   . Urinary incontinence   . Amenorrhea   . CHF (congestive heart failure)   . Hyperlipidemia   . Leg swelling   .  Nausea   . Weakness   . Palpitations   . PONV (postoperative nausea and vomiting)   . Anginal pain     no chest pain in years  . Dysrhythmia     heart palpations  . Anxiety   . Pneumonia   . Shortness of breath   . Chronic kidney disease      overactive bladder  . Peripheral neuropathy   . DVT (deep venous thrombosis)    Past Surgical History  Procedure Laterality Date  . Cholecystectomy    . Tracheostomy  2001  . Endometrial ablation    . Skin graft    . Finger surgery      ring finger on right hand  . Esophageal dilation  2003  . Breast biopsy      needle core, left  . Dilation and curettage of uterus      times 2  . Tracheal dilitation  01/24/2012    Procedure: TRACHEAL DILITATION;  Surgeon: Melissa Montane, MD;  Location: [place];  Service: ENT;  Laterality: N/A;  Trache change with possible dilation, from size 6 to size 7 uncuffed  . Amputation Right 11/19/2013    Procedure: AMPUTATION DIGIT;  Surgeon: Cammie Sickle, MD;  Location: [place];  Service: Orthopedics;  Laterality: Right;  Partial amputation right long finger, Debridement right ring finger   . Amputation Right 06/03/2014    Procedure: REVISION AMPUTATION RIGHT RING FINGER;  Surgeon: Daryll Brod, MD;  Location: [place];  Service: Orthopedics;  Laterality: Right;   Social History:  reports that she has never smoked. She has never used smokeless tobacco. She reports that she does not drink alcohol or use illicit drugs.  Allergies  Allergen Reactions  . Codeine Other (See Comments)    Heart problems  . Molds & Smuts Shortness Of Breath and Rash  . Allopurinol Other (See Comments)    GI problems   . Ciprofloxacin Other (See Comments)    GI problems  . Doxycycline Other (See Comments)    Gastric problems  . Fexofenadine Other (See Comments)    Hurts joints/pain  . Keflex [Cephalexin] Diarrhea and Nausea Only  . Nitroglycerin Er Nausea And Vomiting  . Oritavancin Itching    Mild-moderate itching.   . Sulfa Drugs Cross Reactors Nausea And Vomiting  . Sulfonamide Derivatives Nausea Only  . Accolate [Zafirlukast] Other (See Comments)    Reaction unknown  . Amoxicillin-Pot Clavulanate Other (See Comments)    Other  reaction(s): Unknown  . Morphine Other (See Comments)    unknown  . Nystatin Rash  . Septra [Sulfamethoxazole-Trimethoprim] Other (See Comments)    unknown   Family History  Problem Relation Age of Onset  . Diabetes Father   . Hypertension Father   . Dementia Father     Deceased, 30  . Pneumonia Father   . Heart disease Mother   . Clotting disorder Mother   . Hypertension Mother   . Heart attack Mother     Deceased, 94  . Multiple myeloma Paternal Grandmother   . Cancer Paternal Grandmother     multiple myoloma  . Heart disease Paternal Grandfather   . Kidney disease Maternal Grandmother   . Hypertension Sister   . Cancer Paternal Aunt     breast  . Cancer Cousin     breast - all three    Prior to Admission medications   Medication Sig Start Date End Date Taking? Authorizing Provider  ALPRAZolam (  XANAX) 0.5 MG tablet Take 0.5 mg by mouth 2 (two) times daily as needed. For anxiety   Yes Historical Provider, MD  atorvastatin (LIPITOR) 10 MG tablet Take 10 mg by mouth at bedtime.    Yes Historical Provider, MD  bumetanide (BUMEX) 2 MG tablet Take 1 tablet (2 mg total) by mouth daily. May take one extra 81m tab daily as needed for edema. 10/07/14  Yes JJettie Booze MD  busPIRone (BUSPAR) 5 MG tablet Take 7.5 mg by mouth 2 (two) times daily.    Yes Historical Provider, MD  BYSTOLIC 10 MG tablet TAKE 1 TABLET (10 MG TOTAL) BY MOUTH DAILY. 09/13/14  Yes JJettie Booze MD  DULoxetine (CYMBALTA) 30 MG capsule TAKE 1 CAPSULE (30 MG TOTAL) BY MOUTH 2 (TWO) TIMES DAILY. 06/01/14  Yes Donika K Patel, DO  insulin regular human CONCENTRATED (HUMULIN R) 500 UNIT/ML SOLN injection Inject 8-22 Units into the skin 2 (two) times daily before a meal.    Yes Historical Provider, MD  losartan (COZAAR) 100 MG tablet TAKE 1 TABLET (100MG) BY MOUTH DAILY. 11/08/14  Yes JJettie Booze MD  norethindrone (AYGESTIN) 5 MG tablet Take 2 tablets (10 mg total) by mouth daily. 01/10/15  Yes  JLezlie Lye NP  omeprazole (PRILOSEC) 40 MG capsule Take 40 mg by mouth 2 (two) times daily.    Yes Historical Provider, MD  warfarin (COUMADIN) 5 MG tablet Take 5 mg by mouth daily at 6 PM. Take 5 mg on Monday, Tuesday, Wednesday, Thursday, Friday, and Saturday. Take 2.543m(half a tablet) on Sunday.   Yes Historical Provider, MD  acetaminophen (TYLENOL) 500 MG tablet Take 1,000 mg by mouth every 6 (six) hours as needed for mild pain. For pain    Historical Provider, MD  albuterol (PROVENTIL) (2.5 MG/3ML) 0.083% nebulizer solution Take 2.5 mg by nebulization every 4 (four) hours as needed. For shortness of breath 05/28/11   ViChesley MiresMD  amoxicillin (AMOXIL) 875 MG tablet Take 875 mg by mouth 2 (two) times daily. 09/03/14   Historical Provider, MD  B Complex Vitamins (VITAMIN B COMPLEX PO) Take 1 tablet by mouth daily.     Historical Provider, MD  budesonide (PULMICORT) 0.25 MG/2ML nebulizer solution Take 0.25 mg by nebulization 2 (two) times daily as needed (shortness of breath).    Historical Provider, MD  cefpodoxime (VANTIN) 100 MG tablet Take 1 tablet (100 mg total) by mouth 2 (two) times daily. 11/08/14   MaDebby FreibergMD  cholestyramine (QLucrezia Starch4 G packet Take 1 packet by mouth as needed. For loose stools, IBS.    Historical Provider, MD  clotrimazole (LOTRIMIN) 1 % cream Apply 1 application topically daily. To stomach    Historical Provider, MD  colchicine 0.6 MG tablet Take 0.6 mg by mouth daily.    Historical Provider, MD  ergocalciferol (VITAMIN D2) 50000 UNITS capsule Take 50,000 Units by mouth once a week. Take on Fridays    Historical Provider, MD  Febuxostat (ULORIC) 80 MG TABS Take 80 mg by mouth at bedtime.     Historical Provider, MD  fesoterodine (TOVIAZ) 4 MG TB24 Take 4 mg by mouth daily.      Historical Provider, MD  fluticasone (FLONASE) 50 MCG/ACT nasal spray Place 1 spray into both nostrils daily. 10/04/14   Historical Provider, MD  gabapentin (NEURONTIN) 100 MG  capsule TAKE ONE CAPSULE BY MOUTH EVERY MORNING AND 2 CAPS IN THE AFTERNOON AND 2 CAPS AT BEDTIME Patient taking differently:  Take 100 mg by mouth 4 (four) times daily. TAKE ONE CAPSULE BY MOUTH EVERY MORNING AND 1 CAPS AT LUNCH TIME, 1 AT [facility] AND 2 CAPS AT BEDTIME 05/13/14   Donika K Patel, DO  guaiFENesin (MUCINEX) 600 MG 12 hr tablet Take 600 mg by mouth 2 (two) times daily.     Historical Provider, MD  HYDROcodone-acetaminophen (NORCO/VICODIN) 5-325 MG per tablet Take 1 tablet by mouth every 6 (six) hours as needed for moderate pain.     Historical Provider, MD  ipratropium (ATROVENT) 0.02 % nebulizer solution Take 0.5 mg by nebulization 4 (four) times daily as needed for wheezing or shortness of breath.    Historical Provider, MD  Liraglutide (VICTOZA) 18 MG/3ML SOLN injection Inject 1.8 mg into the skin daily.    Historical Provider, MD  meclizine (ANTIVERT) 25 MG tablet Take 25 mg by mouth at bedtime.     Historical Provider, MD  metolazone (ZAROXOLYN) 5 MG tablet TAKE 1 TABLET (5 MG TOTAL) BY MOUTH SEE ADMIN INSTRUCTIONS. 3 TIMES WEEKLY AS NEEDED FOR EDEMA 09/13/14   Jettie Booze, MD  Multiple Vitamin (MULTIVITAMIN) capsule Take 1 capsule by mouth daily.      Historical Provider, MD  Omega-3 Fatty Acids (OMEGA-3 FISH OIL) 1200 MG CAPS Take 2 capsules (2,400 mg total) by mouth 2 (two) times daily. 09/07/14   Jettie Booze, MD  Oritavancin Diphosphate 1,200 mg in dextrose 5 % 880 mL Inject 1,200 mg into the vein once. Patient not taking: Reported on 11/08/2014 08/02/14   Campbell Riches, MD  potassium chloride SA (K-DUR,KLOR-CON) 20 MEQ tablet Take 20 mEq by mouth 4 (four) times daily. 1 in AM, 2 at lunch, 1 at supper and 2 at bedtime    Historical Provider, MD  primidone (MYSOLINE) 50 MG tablet Take 1 tablet (50 mg total) by mouth 2 (two) times daily. 07/19/14   Alda Berthold, DO  Probiotic Product (PROBIOTIC PO) Take 1 tablet by mouth daily.     Historical Provider, MD   promethazine (PHENERGAN) 25 MG tablet Take 25 mg by mouth every 6 (six) hours as needed for nausea or vomiting.    Historical Provider, MD  triamcinolone cream (KENALOG) 0.1 % Apply 1 application topically daily as needed. itching 10/27/14   Historical Provider, MD   Physical Exam: Blood pressure 111/56, pulse 68, temperature 98 F (36.7 C), temperature source Oral, resp. rate 12, height _0  (1.651 m), weight 192.779 kg (425 lb), last menstrual period 01/13/2015, SpO2 100 %. Filed Vitals:   01/17/15 1430 01/17/15 1445 01/17/15 1500 01/17/15 1515  BP: 107/51 102/50 100/43 111/56  Pulse: 71 67 69 68  Temp:      TempSrc:      Resp: _1 Height:      Weight:      SpO2: 99% 98% 100% 100%     General:  Awake, in nad  Eyes: PERRL B  ENT: membranes moist, dentition fair  Neck: trachea midline, neck supple  Cardiovascular: regular, s1, s23  Respiratory: normal resp effort, no wheezing  Abdomen: soft, morbidly obese, nondistended  Skin: normal skin turgor, no abnormal skin lesions seen  Musculoskeletal: perfused, no clubbing  Psychiatric: mood/affect normal//no auditory/visual hallucinations  Neurologic: cn2-12 grossly intact, strength/sensation intact  Labs on Admission:  Basic Metabolic Panel: No results for input(s): NA, K, CL, CO2, GLUCOSE, BUN, CREATININE, CALCIUM, MG, PHOS in the last 168 hours. Liver Function Tests: No results for input(s): AST, ALT,  ALKPHOS, BILITOT, PROT, ALBUMIN in the last 168 hours. No results for input(s): LIPASE, AMYLASE in the last 168 hours. No results for input(s): AMMONIA in the last 168 hours. CBC: No results for input(s): WBC, NEUTROABS, HGB, HCT, MCV, PLT in the last 168 hours. Cardiac Enzymes: No results for input(s): CKTOTAL, CKMB, CKMBINDEX, TROPONINI in the last 168 hours. BNP: Invalid input(s): POCBNP CBG:  Recent Labs Lab 01/17/15 1118 01/17/15 1252 01/17/15 1429  GLUCAP 356* 277* 257*    Radiological Exams on  Admission: No results found.  Nathaneil Feagans, Orpah Melter [facility] Pager [phone]  If 7PM-7AM, please contact night-coverage [email or web address] Password [facility] 01/17/2015, 3:36 PM

## 2015-01-17 NOTE — H&P (Signed)
The patient was interviewed and examined today.  The previously documented history and physical examination was reviewed. There are no changes. The operative procedure was reviewed. The risks and benefits were outlined again. The specific risks include, but are not limited to, anesthetic complications, bleeding, infections, and possible damage to the surrounding organs. The patient's questions were answered.  We are ready to proceed as outlined. The likelihood of the patient achieving the goals of this procedure is very likely.   Results for orders placed or performed during the hospital encounter of 01/17/15 (from the past 24 hour(s))  Glucose, capillary     Status: Abnormal   Collection Time: 01/17/15 11:18 AM  Result Value Ref Range   Glucose-Capillary 356 (H) 65 - 99 mg/dL  Glucose, capillary     Status: Abnormal   Collection Time: 01/17/15 12:52 PM  Result Value Ref Range   Glucose-Capillary 277 (H) 65 - 99 mg/dL   CBC    Component Value Date/Time   WBC 5.3 01/10/2015 1352   WBC 6.8 06/01/2011 0926   WBC 7.7 02/14/2010 1446   RBC 3.93 01/10/2015 1352   RBC 4.00 06/01/2011 0926   RBC 4.20 02/14/2010 1446   HGB 12.2 01/10/2015 1352   HGB 12.6 06/01/2011 0926   HGB 13.3 02/14/2010 1446   HCT 36.4 01/10/2015 1352   HCT 37.0 06/01/2011 0926   HCT 38.4 02/14/2010 1446   PLT 108* 01/10/2015 1352   PLT 157 06/01/2011 0926   PLT 189 02/14/2010 1446   MCV 92.6 01/10/2015 1352   MCV 92.6 06/01/2011 0926   MCV 91 02/14/2010 1446   MCH 31.0 01/10/2015 1352   MCH 31.6 06/01/2011 0926   MCH 31.6 02/14/2010 1446   MCHC 33.5 01/10/2015 1352   MCHC 34.1 06/01/2011 0926   MCHC 34.6 02/14/2010 1446   RDW 14.2 01/10/2015 1352   RDW 14.8* 06/01/2011 0926   RDW 13.5 02/14/2010 1446   LYMPHSABS 1.8 11/08/2014 1016   LYMPHSABS 1.5 06/01/2011 0926   MONOABS 0.8 11/08/2014 1016   MONOABS 0.8 06/01/2011 0926   EOSABS 0.1 11/08/2014 1016   EOSABS 0.1 06/01/2011 0926   BASOSABS 0.0 11/08/2014  1016   BASOSABS 0.0 06/01/2011 0926    Gildardo Cranker, [name]

## 2015-01-17 NOTE — H&P (Signed)
Admission History and Physical Exam for a Gynecology Patient  Ms. [name] is a 54 y.o. female, G0P0, who presents for hysteroscopy, D&C, basement of a Mirena IUD, and Pap smear. She has been followed at the [facility] of [facility]. The patient has had postmenopausal bleeding. Her weight is greater than 400 pounds. It is very difficult to evaluate the patient in the office. She has had an endometrial ablation in the past.  OB History    Gravida Para Term Preterm AB TAB SAB Ectopic Multiple Living   0         0      Past Medical History  Diagnosis Date  . OSA (obstructive sleep apnea)   . Morbid obesity   . Depressive disorder, not elsewhere classified   . Diabetes mellitus   . Asthma   . Dyslipidemia   . Clostridium difficile enterocolitis 2012  . Splenomegaly 06/01/2011  . Thrombocytopenia 06/01/2011  . Hepatosplenomegaly   . Gout   . GERD (gastroesophageal reflux disease)   . Osteoarthritis   . Polyneuropathy   . Urinary incontinence   . Amenorrhea   . CHF (congestive heart failure)   . Hyperlipidemia   . Leg swelling   . Nausea   . Weakness   . Palpitations   . PONV (postoperative nausea and vomiting)   . Anginal pain     no chest pain in years  . Dysrhythmia     heart palpations  . Anxiety   . Pneumonia   . Shortness of breath   . Chronic kidney disease     overactive bladder  . Peripheral neuropathy   . DVT (deep venous thrombosis)     Prescriptions prior to admission  Medication Sig Dispense Refill Last Dose  . ALPRAZolam (XANAX) 0.5 MG tablet Take 0.5 mg by mouth 2 (two) times daily as needed. For anxiety   01/17/2015 at Unknown time  . atorvastatin (LIPITOR) 10 MG tablet Take 10 mg by mouth at bedtime.    01/16/2015 at Unknown time  . bumetanide (BUMEX) 2 MG tablet Take 1 tablet (2 mg total) by mouth daily. May take one extra 14m tab daily as needed for edema. 90 tablet 3 01/16/2015 at Unknown  time  . busPIRone (BUSPAR) 5 MG tablet Take 7.5 mg by mouth 2 (two) times daily.    01/16/2015 at Unknown time  . BYSTOLIC 10 MG tablet TAKE 1 TABLET (10 MG TOTAL) BY MOUTH DAILY. 90 tablet 3 01/16/2015 at Unknown time  . DULoxetine (CYMBALTA) 30 MG capsule TAKE 1 CAPSULE (30 MG TOTAL) BY MOUTH 2 (TWO) TIMES DAILY. 180 capsule 3 01/16/2015 at Unknown time  . insulin regular human CONCENTRATED (HUMULIN R) 500 UNIT/ML SOLN injection Inject 8-22 Units into the skin 2 (two) times daily before a meal.    01/16/2015 at Unknown time  . losartan (COZAAR) 100 MG tablet TAKE 1 TABLET (100MG) BY MOUTH DAILY. 90 tablet 2 01/16/2015 at Unknown time  . norethindrone (AYGESTIN) 5 MG tablet Take 2 tablets (10 mg total) by mouth daily. 60 tablet 0 01/16/2015 at Unknown time  . omeprazole (PRILOSEC) 40 MG capsule Take 40 mg by mouth 2 (two) times daily.    01/16/2015 at Unknown time  . warfarin (COUMADIN) 5 MG tablet Take 5 mg by mouth daily at 6 PM. Take 5 mg on Monday, Tuesday, Wednesday, Thursday, Friday, and Saturday. Take 2.53m(half a tablet) on Sunday.   Past Week  at Unknown time  . acetaminophen (TYLENOL) 500 MG tablet Take 1,000 mg by mouth every 6 (six) hours as needed for mild pain. For pain   01/10/2015 at Unknown time  . albuterol (PROVENTIL) (2.5 MG/3ML) 0.083% nebulizer solution Take 2.5 mg by nebulization every 4 (four) hours as needed. For shortness of breath   More than a month at Unknown time  . amoxicillin (AMOXIL) 875 MG tablet Take 875 mg by mouth 2 (two) times daily.  0 11/07/2014 at Unknown time  . B Complex Vitamins (VITAMIN B COMPLEX PO) Take 1 tablet by mouth daily.    01/09/2015 at Unknown time  . budesonide (PULMICORT) 0.25 MG/2ML nebulizer solution Take 0.25 mg by nebulization 2 (two) times daily as needed (shortness of breath).   More than a month at Unknown time  . cefpodoxime (VANTIN) 100 MG tablet Take 1 tablet (100 mg total) by mouth 2 (two) times daily. 14 tablet 0   . cholestyramine  (QUESTRAN) 4 G packet Take 1 packet by mouth as needed. For loose stools, IBS.   Past Week at Unknown time  . clotrimazole (LOTRIMIN) 1 % cream Apply 1 application topically daily. To stomach   01/10/2015 at Unknown time  . colchicine 0.6 MG tablet Take 0.6 mg by mouth daily.   01/10/2015 at Unknown time  . ergocalciferol (VITAMIN D2) 50000 UNITS capsule Take 50,000 Units by mouth once a week. Take on Fridays   Past Week at Unknown time  . Febuxostat (ULORIC) 80 MG TABS Take 80 mg by mouth at bedtime.    01/09/2015 at Unknown time  . fesoterodine (TOVIAZ) 4 MG TB24 Take 4 mg by mouth daily.     01/09/2015 at Unknown time  . fluticasone (FLONASE) 50 MCG/ACT nasal spray Place 1 spray into both nostrils daily.  3 11/07/2014 at Unknown time  . gabapentin (NEURONTIN) 100 MG capsule TAKE ONE CAPSULE BY MOUTH EVERY MORNING AND 2 CAPS IN THE AFTERNOON AND 2 CAPS AT BEDTIME (Patient taking differently: Take 100 mg by mouth 4 (four) times daily. TAKE ONE CAPSULE BY MOUTH EVERY MORNING AND 1 CAPS AT LUNCH TIME, 1 AT [facility] AND 2 CAPS AT BEDTIME) 480 capsule 3 01/09/2015 at Unknown time  . guaiFENesin (MUCINEX) 600 MG 12 hr tablet Take 600 mg by mouth 2 (two) times daily.    01/09/2015 at Unknown time  . HYDROcodone-acetaminophen (NORCO/VICODIN) 5-325 MG per tablet Take 1 tablet by mouth every 6 (six) hours as needed for moderate pain.    01/09/2015 at Unknown time  . ipratropium (ATROVENT) 0.02 % nebulizer solution Take 0.5 mg by nebulization 4 (four) times daily as needed for wheezing or shortness of breath.   Past Week at Unknown time  . Liraglutide (VICTOZA) 18 MG/3ML SOLN injection Inject 1.8 mg into the skin daily.   01/10/2015 at Unknown time  . meclizine (ANTIVERT) 25 MG tablet Take 25 mg by mouth at bedtime.    01/09/2015 at Unknown time  . metolazone (ZAROXOLYN) 5 MG tablet TAKE 1 TABLET (5 MG TOTAL) BY MOUTH SEE ADMIN INSTRUCTIONS. 3 TIMES WEEKLY AS NEEDED FOR EDEMA 15 tablet 3 11/07/2014 at Unknown time  .  Multiple Vitamin (MULTIVITAMIN) capsule Take 1 capsule by mouth daily.     01/10/2015 at Unknown time  . Omega-3 Fatty Acids (OMEGA-3 FISH OIL) 1200 MG CAPS Take 2 capsules (2,400 mg total) by mouth 2 (two) times daily. 120 capsule 6 01/09/2015 at Unknown time  . Oritavancin Diphosphate 1,200 mg in dextrose  5 % 880 mL Inject 1,200 mg into the vein once. (Patient not taking: Reported on 11/08/2014) 1 Units 0 Completed Course at Unknown time  . potassium chloride SA (K-DUR,KLOR-CON) 20 MEQ tablet Take 20 mEq by mouth 4 (four) times daily. 1 in AM, 2 at lunch, 1 at supper and 2 at bedtime   01/10/2015 at Unknown time  . primidone (MYSOLINE) 50 MG tablet Take 1 tablet (50 mg total) by mouth 2 (two) times daily. 180 tablet 3 01/09/2015 at Unknown time  . Probiotic Product (PROBIOTIC PO) Take 1 tablet by mouth daily.    01/10/2015 at Unknown time  . promethazine (PHENERGAN) 25 MG tablet Take 25 mg by mouth every 6 (six) hours as needed for nausea or vomiting.   01/10/2015 at Unknown time  . triamcinolone cream (KENALOG) 0.1 % Apply 1 application topically daily as needed. itching  6 01/10/2015 at Unknown time    Past Surgical History  Procedure Laterality Date  . Cholecystectomy    . Tracheostomy  2001  . Endometrial ablation    . Skin graft    . Finger surgery      ring finger on right hand  . Esophageal dilation  2003  . Breast biopsy      needle core, left  . Dilation and curettage of uterus      times 2  . Tracheal dilitation  01/24/2012    Procedure: TRACHEAL DILITATION;  Surgeon: Melissa Montane, MD;  Location: [place];  Service: ENT;  Laterality: N/A;  Trache change with possible dilation, from size 6 to size 7 uncuffed  . Amputation Right 11/19/2013    Procedure: AMPUTATION DIGIT;  Surgeon: Cammie Sickle, MD;  Location: [facility];  Service: Orthopedics;  Laterality: Right;  Partial amputation right long finger, Debridement right ring finger   . Amputation Right 06/03/2014     Procedure: REVISION AMPUTATION RIGHT RING FINGER;  Surgeon: Daryll Brod, MD;  Location: [place];  Service: Orthopedics;  Laterality: Right;    Allergies  Allergen Reactions  . Codeine Other (See Comments)    Heart problems  . Molds & Smuts Shortness Of Breath and Rash  . Allopurinol Other (See Comments)    GI problems   . Ciprofloxacin Other (See Comments)    GI problems  . Doxycycline Other (See Comments)    Gastric problems  . Fexofenadine Other (See Comments)    Hurts joints/pain  . Keflex [Cephalexin] Diarrhea and Nausea Only  . Nitroglycerin Er Nausea And Vomiting  . Oritavancin Itching    Mild-moderate itching.   . Sulfa Drugs Cross Reactors Nausea And Vomiting  . Sulfonamide Derivatives Nausea Only  . Accolate [Zafirlukast] Other (See Comments)    Reaction unknown  . Amoxicillin-Pot Clavulanate Other (See Comments)    Other reaction(s): Unknown  . Morphine Other (See Comments)    unknown  . Nystatin Rash  . Septra [Sulfamethoxazole-Trimethoprim] Other (See Comments)    unknown    Family History: family history includes Cancer in her cousin, paternal aunt, and paternal grandmother; Clotting disorder in her mother; Dementia in her father; Diabetes in her father; Heart attack in her mother; Heart disease in her mother and paternal grandfather; Hypertension in her father, mother, and sister; Kidney disease in her maternal grandmother; Multiple myeloma in her paternal grandmother; Pneumonia in her father.  Social History:  reports that she has never smoked. She has never used smokeless tobacco. She reports that she does not drink alcohol  or use illicit drugs.  Review of systems: See HPI.  Admission Physical Exam:    Body mass index is 70.72 kg/(m^2).  Blood pressure 103/49, pulse 67, temperature 98.4 F (36.9 C), temperature source Oral, resp. rate 26, height 5' 5"  (1.651 m), weight 425 lb (192.779 kg), last menstrual period 01/13/2015, SpO2 98  %.  HEENT:                 Within normal limits, tracheostomy in place Chest:                   Clear Heart:                    Regular rate and rhythm Breasts:                No masses, skin changes, bleeding, or discharge present Abdomen:             Nontender, no masses, massive panniculus Extremities:          Grossly normal Neurologic exam: Grossly normal  Pelvic exam:  Difficult to outline the pelvic organs because of obesity. No lesions are noted in the vagina.  Assessment:  Postmenopausal bleeding  Obesity  Hypertension  Diabetes  History of DVT  Multiple medical problems  Plan:  The patient will undergo hysteroscopy, D&C, Pap smear, and placement of a Mirena IUD. She understands the indications for surgical procedure. She accepts the risk of, but not limited to, and aesthetic complications, bleeding, infections, and possible damage to the surrounding organs.   Eli Hose 01/17/2015

## 2015-01-17 NOTE — Progress Notes (Signed)
Subjective:  Patient reports low back pain. Tolerating clear liquids.  Objective:  I have reviewed patient's vital signs, medications and labs.  General: alert, no distress and morbidly obese Resp: clear to auscultation bilaterally Cardio: regular rate and rhythm, S1, S2 normal, no murmur, click, rub or gallop GI: Nontender Extremities: Homans sign is negative, no sign of DVT   Assessment/Plan:  Status post D&C and placement of a Mirena IUD  Blood sugars are elevated but stable  Recovering well in the intensive care unit  EKG has returned to its preoperative status. No evidence of MI  Advance diet as tolerated  Plan for discharge tomorrow   LOS: 0 days    [name] 01/17/2015, 6:25 PM

## 2015-01-17 NOTE — Progress Notes (Signed)
[facility] Progress Note Patient Name: ATLAS KUC DOB: [birth year] MRN: [id number]   Date of Service  01/17/2015  HPI/Events of Note  Chr vent  eICU Interventions  PS 15/5 for noct vent     Intervention Category Intermediate Interventions: Other:  ALVA,RAKESH V. 01/17/2015, 11:04 PM

## 2015-01-17 NOTE — Op Note (Signed)
OPERATIVE NOTE  [name]  DOB:    [birth year]  MRN:    [id number]  CSN:    [id number]  Date of Surgery:  01/17/2015  Preoperative Diagnosis:  Postmenopausal bleeding  Massive obesity  Diabetes  History of DVT  Heart disease  Renal disease  Pulmonary disease  Postoperative Diagnosis:  Same  Unstable blood pressure during procedure  Procedure:  Examination under anesthesia  Dilation and curettage  Pap smear  Placement of a Mirena IUD  Surgeon:  Gildardo Cranker, [name]  Assistant:  None  Anesthetic:  Propofol  Disposition:  The patient is a 54 y.o. year old female who presents with post menopausal bleeding. The patient has obesity. We are unable to adequately evaluate the patient in the office. I have proposed that we perform hysteroscopy with D&C, and placement of a Mirena IUD. We will obtain a Pap smear. The patient understands the indications for her surgical procedure. She accepts the risk of, but not limited to, anesthetic complications, bleeding, infections, and possible damage to the surrounding organs. The patient will be observed in intensive care unit after her procedure.  Findings:  The patient was noted to have atrophic changes in the vagina. A small amount of old blood was present at the cervix. The patient's blood pressure became unstable during the procedure. It was felt that we should abort the patient's hysteroscopy for her safety. An endocervical curettage was performed. Glandular tissue was noted. The uterus sounded to 8 cm. Polypoid tissue was noted on D&C. We are unable to palpate the patient's adnexa and the fundus of the uterus. No parametrial disease was appreciated on examination under anesthesia (but our exam is compromised by obesity).  Procedure:  The patient was taken to the operating room where propofol was given. The patient's blood pressure became unstable when we elevated her massive panniculus so that we could  access the vagina and the perineum. We decided to move as quickly as possible which required [name] to abort the hysteroscopy. A Pap smear was obtained. The patient's perineum, and vagina were prepped with Betadine. The bladder was drained of urine. Examination under anesthesia was performed. An endocervical curettage was obtained. The uterus sounded to 8 centimeters. The cervix was gently dilated. The uterine cavity was evacuated using a medium sharp curette. A Mirena IUD was placed per protocol. The lot number is [id number]. The expiration date is January 2019. The IUD string was cut. All incisions were removed. The patient was returned to the supine position. Her panniculus was released. Her blood pressure improved. The patient flipped her T waves on the EKG monitor. She did not have any ST changes. The estimated blood loss was 5 cc. The uterus was reexamined and was noted to be firm. Hemostasis was adequate. Sponge and needle counts were correct. The patient tolerated her but her procedure well. She was awakened from her anesthetic without difficulty. She was transported to the recovery room in stable condition. The endocervical curettage and the endometrial curettings were sent to pathology. The Pap smear was sent to cytology.  Followup instructions:  The patient will be transferred to the intensive care unit after her recovery. She will be observed overnight. We will plan to discharge the patient tomorrow.  Gildardo Cranker, [name]

## 2015-01-18 ENCOUNTER — Encounter (HOSPITAL_COMMUNITY): Payer: Self-pay | Admitting: *Deleted

## 2015-01-18 DIAGNOSIS — C530 Malignant neoplasm of endocervix: Secondary | ICD-10-CM | POA: Diagnosis not present

## 2015-01-18 DIAGNOSIS — E119 Type 2 diabetes mellitus without complications: Secondary | ICD-10-CM | POA: Diagnosis not present

## 2015-01-18 DIAGNOSIS — I10 Essential (primary) hypertension: Secondary | ICD-10-CM | POA: Diagnosis not present

## 2015-01-18 DIAGNOSIS — N950 Postmenopausal bleeding: Secondary | ICD-10-CM | POA: Diagnosis present

## 2015-01-18 DIAGNOSIS — I5032 Chronic diastolic (congestive) heart failure: Secondary | ICD-10-CM | POA: Diagnosis not present

## 2015-01-18 LAB — PROTIME-INR
INR: 1.16 (ref 0.00–1.49)
Prothrombin Time: 14.9 seconds (ref 11.6–15.2)

## 2015-01-18 LAB — GLUCOSE, CAPILLARY
GLUCOSE-CAPILLARY: 254 mg/dL — AB (ref 65–99)
GLUCOSE-CAPILLARY: 247 mg/dL — AB (ref 65–99)
Glucose-Capillary: 244 mg/dL — ABNORMAL HIGH (ref 65–99)
Glucose-Capillary: 242 mg/dL — ABNORMAL HIGH (ref 65–99)

## 2015-01-18 LAB — HEMOGLOBIN A1C
Hgb A1c MFr Bld: 8.6 % — ABNORMAL HIGH (ref 4.8–5.6)
MEAN PLASMA GLUCOSE: 200 mg/dL

## 2015-01-18 LAB — CYTOLOGY - PAP

## 2015-01-18 MED ORDER — OXYCODONE-ACETAMINOPHEN 5-325 MG PO TABS: 1.0000 | ORAL_TABLET | ORAL | Status: DC | PRN

## 2015-01-18 NOTE — Addendum Note (Signed)
Addendum  created 01/18/15 0926 by Raenette Rover, CRNA   Modules edited: Notes Section   Notes Section:  File: [id number]

## 2015-01-18 NOTE — Discharge Summary (Signed)
Physician Discharge Summary   Patient ID:  CLOTINE HEINER MRN: [id number] DOB/AGE: 23-Feb-1961 54 y.o.  Admit date:         01/17/2015 Discharge date: 01/18/2015  Admission Diagnoses:  Pelvic Pain, postmenopausal bleeding, Morbid Obesity, diabetes, anxiety, chronic respiratory failure, history of DVT  Discharge Diagnoses:   Same  Uterine polyps  Intraoperative blood pressure instability  Procedures this Admission:  01/17/2015  Procedure(s) (LRB): DILATATION & CURETTAGE; Pap Smear (N/A) INTRAUTERINE DEVICE (IUD) INSERTION (N/A)  Discharged Condition: fair   Admission Hx and PE: The patient has been followed at the [facility]. She has a history of  Pelvic Pain, postmenopausal bleeding, Morbid Obesity, history of DVT, anxiety, and chronic respiratory failure. We are unable to adequately sample the uterus in an outpatient setting. Therefore, surgery is scheduled for adequate sampling. Please see her documented history and physical exam.  Hospital course:  On the day of admission, the patient underwent the following Procedure(s):  DILATATION & CURETTAGe; Pap Smear INTRAUTERINE DEVICE (IUD) INSERTION. Operative findings included endometrial polyps. The uterus sounded to 8 cm. Tissue was noted on the endocervical sampling. The patient's blood pressure became unstable when her panniculus was elevated. We aborted our intention to perform hysteroscopy. We quickly sample the endometrium. A Mirena IUD was placed. The patient was taken to the recovery room in stable condition. There were no EKG changes that made [name] worry about a myocardial infarction. She was taken to the intensive care unit for her hospital recovery. She was seen by the Hospitalist staff. Her postoperative course was uneventful. She quickly tolerated a regular diet. Her postoperative pain was controlled with oral medication. She remained afebrile. Today she was felt to be  ready for discharge.  Labs:  HEMOGLOBIN  Date Value Ref Range Status  01/10/2015 12.2 12.0 - 15.0 g/dL Final   HGB  Date Value Ref Range Status  06/01/2011 12.6 11.6 - 15.9 g/dL Final  02/14/2010 13.3 11.6 - 15.9 g/dL Final   HCT  Date Value Ref Range Status  01/10/2015 36.4 36.0 - 46.0 % Final  06/01/2011 37.0 34.8 - 46.6 % Final  02/14/2010 38.4 34.8 - 46.6 % Final   Results for orders placed or performed during the hospital encounter of 01/17/15 (from the past 24 hour(s))  Glucose, capillary     Status: Abnormal   Collection Time: 01/17/15 11:18 AM  Result Value Ref Range   Glucose-Capillary 356 (H) 65 - 99 mg/dL  Glucose, capillary     Status: Abnormal   Collection Time: 01/17/15 12:52 PM  Result Value Ref Range   Glucose-Capillary 277 (H) 65 - 99 mg/dL  Glucose, capillary     Status: Abnormal   Collection Time: 01/17/15  1:39 PM  Result Value Ref Range   Glucose-Capillary 254 (H) 65 - 99 mg/dL  Glucose, capillary     Status: Abnormal   Collection Time: 01/17/15  1:59 PM  Result Value Ref Range   Glucose-Capillary 244 (H) 65 - 99 mg/dL  Glucose, capillary     Status: Abnormal   Collection Time: 01/17/15  2:29 PM  Result Value Ref Range   Glucose-Capillary 257 (H) 65 - 99 mg/dL  Hemoglobin A1c     Status: Abnormal   Collection Time: 01/17/15  3:15 PM  Result Value Ref Range   Hgb A1c MFr Bld 8.6 (H) 4.8 - 5.6 %   Mean Plasma Glucose 200 mg/dL  Comprehensive metabolic panel  Status: Abnormal   Collection Time: 01/17/15  3:15 PM  Result Value Ref Range   Sodium 134 (L) 135 - 145 mmol/L   Potassium 4.0 3.5 - 5.1 mmol/L   Chloride 101 101 - 111 mmol/L   CO2 27 22 - 32 mmol/L   Glucose, Bld 274 (H) 65 - 99 mg/dL   BUN 11 6 - 20 mg/dL   Creatinine, Ser 1.10 (H) 0.44 - 1.00 mg/dL   Calcium 8.0 (L) 8.9 - 10.3 mg/dL   Total Protein 7.0 6.5 - 8.1 g/dL   Albumin 3.4 (L) 3.5 - 5.0 g/dL   AST 26 15 - 41 U/L   ALT 25 14 - 54 U/L   Alkaline Phosphatase 45 38 - 126  U/L   Total Bilirubin 0.7 0.3 - 1.2 mg/dL   GFR calc non Af Amer 57 (L) >60 mL/min   GFR calc Af Amer >60 >60 mL/min   Anion gap 6 5 - 15  Glucose, capillary     Status: Abnormal   Collection Time: 01/17/15  3:32 PM  Result Value Ref Range   Glucose-Capillary 284 (H) 65 - 99 mg/dL  Glucose, capillary     Status: Abnormal   Collection Time: 01/17/15  3:34 PM  Result Value Ref Range   Glucose-Capillary 294 (H) 65 - 99 mg/dL  Protime-INR     Status: Abnormal   Collection Time: 01/17/15  4:45 PM  Result Value Ref Range   Prothrombin Time 15.8 (H) 11.6 - 15.2 seconds   INR 1.25 0.00 - 1.49  Glucose, capillary     Status: Abnormal   Collection Time: 01/17/15  5:55 PM  Result Value Ref Range   Glucose-Capillary 303 (H) 65 - 99 mg/dL   Comment 1 Notify RN    Comment 2 Document in Chart   Glucose, capillary     Status: Abnormal   Collection Time: 01/17/15 10:12 PM  Result Value Ref Range   Glucose-Capillary 247 (H) 65 - 99 mg/dL  Protime-INR     Status: None   Collection Time: 01/18/15  5:25 AM  Result Value Ref Range   Prothrombin Time 14.9 11.6 - 15.2 seconds   INR 1.16 0.00 - 1.49  Glucose, capillary     Status: Abnormal   Collection Time: 01/18/15  7:47 AM  Result Value Ref Range   Glucose-Capillary 242 (H) 65 - 99 mg/dL    Consults: Hospitalist  Final pathology report: Pending at the time of discharge  Disposition:  The patient will be discharged to home. She has been given a copy of the discharge instructions as prepared by the [facility] for patients who have undergone the Procedure(s):  [place]; Pap Smear INTRAUTERINE DEVICE (IUD) INSERTION.      Medication List    STOP taking these medications        amoxicillin 875 MG tablet  Commonly known as:  AMOXIL     cefpodoxime 100 MG tablet  Commonly known as:  VANTIN     HYDROcodone-acetaminophen 5-325 MG per tablet  Commonly known as:  NORCO/VICODIN     Oritavancin  Diphosphate 1,200 mg in dextrose 5 % 880 mL      TAKE these medications        acetaminophen 500 MG tablet  Commonly known as:  TYLENOL  Take 1,000 mg by mouth every 6 (six) hours as needed for mild pain. For pain     albuterol (2.5 MG/3ML) 0.083% nebulizer solution  Commonly known  as:  PROVENTIL  Take 2.5 mg by nebulization every 4 (four) hours as needed. For shortness of breath     ALPRAZolam 0.5 MG tablet  Commonly known as:  XANAX  Take 0.5 mg by mouth 2 (two) times daily as needed. For anxiety     ANTIVERT 25 MG tablet  Generic drug:  meclizine  Take 25 mg by mouth at bedtime.     atorvastatin 10 MG tablet  Commonly known as:  LIPITOR  Take 10 mg by mouth at bedtime.     budesonide 0.25 MG/2ML nebulizer solution  Commonly known as:  PULMICORT  Take 0.25 mg by nebulization 2 (two) times daily as needed (shortness of breath).     bumetanide 2 MG tablet  Commonly known as:  BUMEX  Take 1 tablet (2 mg total) by mouth daily. May take one extra 2mg  tab daily as needed for edema.     busPIRone 5 MG tablet  Commonly known as:  BUSPAR  Take 7.5 mg by mouth 2 (two) times daily.     BYSTOLIC 10 MG tablet  Generic drug:  nebivolol  TAKE 1 TABLET (10 MG TOTAL) BY MOUTH DAILY.     cholestyramine 4 G packet  Commonly known as:  QUESTRAN  Take 1 packet by mouth as needed. For loose stools, IBS.     clotrimazole 1 % cream  Commonly known as:  LOTRIMIN  Apply 1 application topically daily. To stomach     colchicine 0.6 MG tablet  Take 0.6 mg by mouth daily.     DULoxetine 30 MG capsule  Commonly known as:  CYMBALTA  TAKE 1 CAPSULE (30 MG TOTAL) BY MOUTH 2 (TWO) TIMES DAILY.     ergocalciferol 50000 UNITS capsule  Commonly known as:  VITAMIN D2  Take 50,000 Units by mouth once a week. Take on Fridays     fluticasone 50 MCG/ACT nasal spray  Commonly known as:  FLONASE  Place 1 spray into both nostrils daily.     gabapentin 100 MG capsule  Commonly known as:   NEURONTIN  TAKE ONE CAPSULE BY MOUTH EVERY MORNING AND 2 CAPS IN THE AFTERNOON AND 2 CAPS AT BEDTIME     guaiFENesin 600 MG 12 hr tablet  Commonly known as:  MUCINEX  Take 600 mg by mouth 2 (two) times daily.     HUMULIN R 500 UNIT/ML injection  Generic drug:  insulin regular human CONCENTRATED  Inject 40-110 Units into the skin 2 (two) times daily before a meal. Patient injects 110 units before breakfast and 40 units before dinner.     ipratropium 0.02 % nebulizer solution  Commonly known as:  ATROVENT  Take 0.5 mg by nebulization 4 (four) times daily as needed for wheezing or shortness of breath.     losartan 100 MG tablet  Commonly known as:  COZAAR  TAKE 1 TABLET (100MG ) BY MOUTH DAILY.     metolazone 5 MG tablet  Commonly known as:  ZAROXOLYN  TAKE 1 TABLET (5 MG TOTAL) BY MOUTH SEE ADMIN INSTRUCTIONS. 3 TIMES WEEKLY AS NEEDED FOR EDEMA     multivitamin capsule  Take 1 capsule by mouth daily.     norethindrone 5 MG tablet  Commonly known as:  AYGESTIN  Take 2 tablets (10 mg total) by mouth daily.     Omega-3 Fish Oil 1200 MG Caps  Take 2 capsules (2,400 mg total) by mouth 2 (two) times daily.     omeprazole 40 MG capsule  Commonly known as:  PRILOSEC  Take 40 mg by mouth 2 (two) times daily.     oxyCODONE-acetaminophen 5-325 MG per tablet  Commonly known as:  ROXICET  Take 1 tablet by mouth every 4 (four) hours as needed for severe pain.     potassium chloride SA 20 MEQ tablet  Commonly known as:  K-DUR,KLOR-CON  Take 20 mEq by mouth 4 (four) times daily. 1 in AM, 2 at lunch, 1 at supper and 2 at bedtime     primidone 50 MG tablet  Commonly known as:  MYSOLINE  Take 1 tablet (50 mg total) by mouth 2 (two) times daily.     PROBIOTIC PO  Take 1 tablet by mouth daily.     promethazine 25 MG tablet  Commonly known as:  PHENERGAN  Take 25 mg by mouth every 6 (six) hours as needed for nausea or vomiting.     TOVIAZ 4 MG Tb24 tablet  Generic drug:  fesoterodine   Take 4 mg by mouth daily.     triamcinolone cream 0.1 %  Commonly known as:  KENALOG  Apply 1 application topically daily as needed. itching     ULORIC 80 MG Tabs  Generic drug:  Febuxostat  Take 80 mg by mouth at bedtime.     VICTOZA 18 MG/3ML Soln injection  Generic drug:  Liraglutide  Inject 1.8 mg into the skin daily.     VITAMIN B COMPLEX PO  Take 1 tablet by mouth daily.     warfarin 5 MG tablet  Commonly known as:  COUMADIN  Take 5 mg by mouth daily at 6 PM. Take 5 mg on Monday, Tuesday, Wednesday, Thursday, Friday, and Saturday. Take 2.5mg  (half a tablet) on Sunday.           Follow-up Information    Follow up with Eli Hose, MD In 2 weeks.   Specialty:  Obstetrics and Gynecology   Contact information:   [address] [place] [address] [phone]       Follow up with [name], MD.   Specialty:  Family Medicine   Contact information:   [place] [employer] [place] [address] [phone]       Signed: Eli Hose 01/18/2015, 8:11 AM

## 2015-01-18 NOTE — Progress Notes (Signed)
Pt d/c'd home with caregiver. Prescription & discharge instructions given.

## 2015-01-18 NOTE — Anesthesia Postprocedure Evaluation (Signed)
  Anesthesia Post-op Note  Patient: [name]  Procedure(s) Performed: Procedure(s): DILATATION & CURETTAGE/HYSTEROSCOPY WITH RESECTOCOPE; Pap Smear (N/A) INTRAUTERINE DEVICE (IUD) INSERTION (N/A)  Patient Location: A-ICU  Anesthesia Type:General  Level of Consciousness: awake, alert , oriented and patient cooperative  Airway and Oxygen Therapy: Patient Spontanous Breathing  Post-op Pain: none  Post-op Assessment: Post-op Vital signs reviewed, Patient's Cardiovascular Status Stable, Respiratory Function Stable, Patent Airway and No signs of Nausea or vomiting              Post-op Vital Signs: Reviewed and stable  Last Vitals:  Filed Vitals:   01/18/15 0830  BP:   Pulse:   Temp:   Resp: 24    Complications: No apparent anesthesia complications

## 2015-01-18 NOTE — Progress Notes (Signed)
[name] was sitting up in bed when I arrived. She told me she had surgery and is going home today. She talked about her faith and her journey over the past 15 years with her illnesses and the loss of her parents. I listened and provided support and encouragement when appropriate. Pt wanted prayer before our visit concluded. Please page if additional support is needed. Chaplain Marlise Eves Holder   01/18/15 1200  Clinical Encounter Type  Visited With Patient

## 2015-01-18 NOTE — Progress Notes (Signed)
[facility] PROGRESS NOTE  [name] UTM:[id number] DOB: 06-Oct-1960 DOA: 01/17/2015 PCP: [name], MD  Assessment/Plan: 1. DM2 1. Glucose remains in the mid-200's overnight 2. Pt on U500 insulin per home med rec 3. Most recent a1c from 07/11/14 noted to be 7.2, repeat a1c of 8.6 4. Recommend close outpatient f/u with PCP to titrate insulin 2. HTN 1. BP remained stable overnight 2. Cont current regimen 3. Chronic diastolic CHF 1. Clinically euvolemic 2. Dry weight 190.5kg (420lbs) 3. Recommend Daily weights 4. Most recent 2d echo from 2010 with EF of 50-55% 4. Chronic respiratory failure 1. 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 Morbid obesity 1. stable 6. Vaginal bleeding 1. Deferred to primary service 7. Hx of DVT 1. On coumadin, INR 1.16 today 2. Pt reports DVT diagnosed in 1988 and had been on therapeutic coumadin since 3. Question if pt truly requires therapeutic anticoagulation by now? 4. Will defer to PCP who is managing INR as outpatient 5. Would conservatively resume anticoagulation as tolerated without bridge, especially given recent bleeding issues and recent surgery 8. Chronic anticoagulation 1. Therapeutic coumadin per above  Thank you for this consult. Patient has remained medically stable overnight and I understand patient is currently being discharged home. Would recommend very close outpatient follow up with pt's PCP, specifically to address poorly controlled DM as well as remote hx of DVT per above.   HPI/Subjective: Chart reviewed. Discussed with staff. Pt without events overnight. No complaints.  Objective: Filed Vitals:   01/18/15 0300 01/18/15 0500 01/18/15 0520 01/18/15 0636  BP:    141/63  Pulse: 76 66 66 70  Temp:      TempSrc:      Resp:    24  Height:      Weight:      SpO2: 98% 99% 99% 98%    Intake/Output Summary (Last 24 hours) at 01/18/15 0809 Last data filed at 01/17/15 1800  Gross per 24 hour  Intake   1460 ml  Output     15 ml  Net   1445 ml   Filed Weights   01/17/15 1138  Weight: 192.779 kg (425 lb)    Data Reviewed: Basic Metabolic Panel:  Recent Labs Lab 01/17/15 1515  NA 134*  K 4.0  CL 101  CO2 27  GLUCOSE 274*  BUN 11  CREATININE 1.10*  CALCIUM 8.0*   Liver Function Tests:  Recent Labs Lab 01/17/15 1515  AST 26  ALT 25  ALKPHOS 45  BILITOT 0.7  PROT 7.0  ALBUMIN 3.4*   No results for input(s): LIPASE, AMYLASE in the last 168 hours. No results for input(s): AMMONIA in the last 168 hours. CBC: No results for input(s): WBC, NEUTROABS, HGB, HCT, MCV, PLT in the last 168 hours. Cardiac Enzymes: No results for input(s): CKTOTAL, CKMB, CKMBINDEX, TROPONINI in the last 168 hours. BNP (last 3 results)  Recent Labs  09/20/14 1155 11/08/14 1016  BNP 12.5 53.8    ProBNP (last 3 results)  Recent Labs  05/18/14 1335  PROBNP 927.1*    CBG:  Recent Labs Lab 01/17/15 1532 01/17/15 1534 01/17/15 1755 01/17/15 2212 01/18/15 0747  GLUCAP 284* 294* 303* 247* 242*    No results found for this or any previous visit (from the past 240 hour(s)).   Studies: No results found.  Scheduled Meds: . antiseptic oral rinse  7 mL Mouth Rinse q12n4p  . atorvastatin  10 mg Oral QHS  . bumetanide  2 mg Oral Daily  . busPIRone  7.5 mg Oral BID  . chlorhexidine  15 mL Mouth Rinse BID  . colchicine  0.6 mg Oral Daily  . DULoxetine  30 mg Oral Daily  . febuxostat  80 mg Oral QHS  . fesoterodine  4 mg Oral Daily  . fluticasone  1 spray Each Nare Daily  . gabapentin  100 mg Oral TID WC  . gabapentin  200 mg Oral QHS  . guaiFENesin  600 mg Oral BID  . heparin subcutaneous  5,000 Units Subcutaneous 3 times per day  . insulin aspart  0-20 Units Subcutaneous TID WC  . insulin aspart  0-5 Units Subcutaneous QHS  . insulin regular human CONCENTRATED  110 Units Subcutaneous Q breakfast   And  . insulin regular human CONCENTRATED  40  Units Subcutaneous Q supper  . Liraglutide  1.8 mg Subcutaneous Daily  . meclizine  25 mg Oral QHS  . metolazone  5 mg Oral Daily  . nebivolol  10 mg Oral Daily  . potassium chloride  20 mEq Oral BID WC   And  . potassium chloride  40 mEq Oral 2 times per day  . primidone  50 mg Oral BID  . warfarin  5 mg Oral q1800  . Warfarin - Physician Dosing Inpatient   Does not apply q1800   Continuous Infusions:   Active Problems:   Morbid obesity   Diabetes mellitus type 2 in obese   HTN (hypertension), benign   Tracheostomy dependence   Chronic respiratory failure   Chronic diastolic heart failure   Vaginal bleeding   Postmenopausal bleeding   Post-menopausal bleeding   CHIU, STEPHEN K  [facility] Pager [phone]. If 7PM-7AM, please contact night-coverage at [email or web address], password [facility] 01/18/2015, 8:09 AM  LOS: 1 day

## 2015-01-18 NOTE — Discharge Instructions (Signed)
Dilation and Curettage or Vacuum Curettage  Dilation and curettage (D&C) and vacuum curettage are minor procedures. A D&C involves stretching (dilation) the cervix and scraping (curettage) the inside lining of the womb (uterus). During a D&C, tissue is gently scraped from the inside lining of the uterus. During a vacuum curettage, the lining and tissue in the uterus are removed with the use of gentle suction.  Curettage may be performed to either diagnose or treat a problem. As a diagnostic procedure, curettage is performed to examine tissues from the uterus. A diagnostic curettage may be performed for the following symptoms:   Irregular bleeding in the uterus.   Bleeding with the development of clots.   Spotting between menstrual periods.   Prolonged menstrual periods.   Bleeding after menopause.   No menstrual period (amenorrhea).   A change in size and shape of the uterus.  As a treatment procedure, curettage may be performed for the following reasons:   Removal of an IUD (intrauterine device).   Removal of retained placenta after giving birth. Retained placenta can cause an infection or bleeding severe enough to require transfusions.   Abortion.   Miscarriage.   Removal of polyps inside the uterus.   Removal of uncommon types of noncancerous lumps (fibroids).  LET [facility] CARE PROVIDER KNOW ABOUT:   Any allergies you have.   All medicines you are taking, including vitamins, herbs, eye drops, creams, and over-the-counter medicines.   Previous problems you or members of your family have had with the use of anesthetics.   Any blood disorders you have.   Previous surgeries you have had.   Medical conditions you have. RISKS AND COMPLICATIONS  Generally, this is a safe procedure. However, as with any procedure, complications can occur. Possible complications include:  Excessive bleeding.   Infection of the uterus.   Damage to the cervix.    Development of scar tissue (adhesions) inside the uterus, later causing abnormal amounts of menstrual bleeding.   Complications from the general anesthetic, if a general anesthetic is used.   Putting a hole (perforation) in the uterus. This is rare.  BEFORE THE PROCEDURE   Eat and drink before the procedure only as directed by your health care provider.   Arrange for someone to take you home.  PROCEDURE  This procedure usually takes about 15-30 minutes.  You will be given one of the following:  A medicine that numbs the area in and around the cervix (local anesthetic).   A medicine to make you sleep through the procedure (general anesthetic).  You will lie on your back with your legs in stirrups.   A warm metal or plastic instrument (speculum) will be placed in your vagina to keep it open and to allow the health care provider to see the cervix.  There are two ways in which your cervix can be softened and dilated. These include:   Taking a medicine.   Having thin rods (laminaria) inserted into your cervix.   A curved tool (curette) will be used to scrape cells from the inside lining of the uterus. In some cases, gentle suction is applied with the curette. The curette will then be removed.  AFTER THE PROCEDURE   You will rest in the recovery area until you are stable and are ready to go home.   You may feel sick to your stomach (nauseous) or throw up (vomit) if you were given a general anesthetic.   You may have a sore throat if a  tube was placed in your throat during general anesthesia.   You may have light cramping and bleeding. This may last for 2 days to 2 weeks after the procedure.   Your uterus needs to make a new lining after the procedure. This may make your next period late. Document Released: 06/11/2005 Document Revised: 02/11/2013 Document Reviewed: 01/08/2013 [facility] Patient Information 2015 [place], [place]. This information is not intended to  replace advice given to you by your health care provider. Make sure you discuss any questions you have with your health care provider.

## 2015-01-21 ENCOUNTER — Ambulatory Visit: Payer: Medicare Other | Admitting: Gynecology

## 2015-01-26 ENCOUNTER — Encounter (HOSPITAL_BASED_OUTPATIENT_CLINIC_OR_DEPARTMENT_OTHER): Payer: Medicare Other | Attending: Surgery

## 2015-01-26 DIAGNOSIS — Z930 Tracheostomy status: Secondary | ICD-10-CM | POA: Insufficient documentation

## 2015-01-26 DIAGNOSIS — X58XXXA Exposure to other specified factors, initial encounter: Secondary | ICD-10-CM | POA: Diagnosis not present

## 2015-01-26 DIAGNOSIS — I890 Lymphedema, not elsewhere classified: Secondary | ICD-10-CM | POA: Diagnosis not present

## 2015-01-26 DIAGNOSIS — S60132A Contusion of left middle finger with damage to nail, initial encounter: Secondary | ICD-10-CM | POA: Diagnosis not present

## 2015-01-26 DIAGNOSIS — S60511A Abrasion of right hand, initial encounter: Secondary | ICD-10-CM | POA: Diagnosis not present

## 2015-01-26 DIAGNOSIS — S60512A Abrasion of left hand, initial encounter: Secondary | ICD-10-CM | POA: Insufficient documentation

## 2015-01-26 DIAGNOSIS — I509 Heart failure, unspecified: Secondary | ICD-10-CM | POA: Diagnosis not present

## 2015-01-26 DIAGNOSIS — G4733 Obstructive sleep apnea (adult) (pediatric): Secondary | ICD-10-CM | POA: Diagnosis not present

## 2015-01-26 DIAGNOSIS — M1990 Unspecified osteoarthritis, unspecified site: Secondary | ICD-10-CM | POA: Insufficient documentation

## 2015-01-26 DIAGNOSIS — I10 Essential (primary) hypertension: Secondary | ICD-10-CM | POA: Diagnosis not present

## 2015-01-26 DIAGNOSIS — E1142 Type 2 diabetes mellitus with diabetic polyneuropathy: Secondary | ICD-10-CM | POA: Insufficient documentation

## 2015-01-26 DIAGNOSIS — S60151A Contusion of right little finger with damage to nail, initial encounter: Secondary | ICD-10-CM | POA: Diagnosis not present

## 2015-01-26 DIAGNOSIS — J449 Chronic obstructive pulmonary disease, unspecified: Secondary | ICD-10-CM | POA: Insufficient documentation

## 2015-01-26 DIAGNOSIS — M109 Gout, unspecified: Secondary | ICD-10-CM | POA: Insufficient documentation

## 2015-01-26 DIAGNOSIS — S60159A Contusion of unspecified little finger with damage to nail, initial encounter: Secondary | ICD-10-CM | POA: Insufficient documentation

## 2015-01-26 DIAGNOSIS — M869 Osteomyelitis, unspecified: Secondary | ICD-10-CM | POA: Diagnosis not present

## 2015-01-26 DIAGNOSIS — J45909 Unspecified asthma, uncomplicated: Secondary | ICD-10-CM | POA: Insufficient documentation

## 2015-01-26 DIAGNOSIS — E11622 Type 2 diabetes mellitus with other skin ulcer: Secondary | ICD-10-CM | POA: Insufficient documentation

## 2015-01-28 ENCOUNTER — Encounter: Payer: Self-pay | Admitting: Gynecology

## 2015-01-28 ENCOUNTER — Ambulatory Visit: Payer: Medicare Other | Attending: Gynecology | Admitting: Gynecology

## 2015-01-28 VITALS — BP 150/63 | HR 69 | Temp 98.3°F | Resp 22 | Ht 65.0 in | Wt >= 6400 oz

## 2015-01-28 DIAGNOSIS — C541 Malignant neoplasm of endometrium: Secondary | ICD-10-CM | POA: Insufficient documentation

## 2015-01-28 NOTE — Progress Notes (Signed)
Consult Note: Gyn-Onc   KAYLANIE CAPILI 54 y.o. female  Chief Complaint  Patient presents with  . 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 The patient is not a surgical candidate secondary to multiple medical problems and morbid obesity. Plan: treatment options were discussed with the patient her sister and caregivers. I would recommend the patient receive whole pelvis radiation therapy as well as leaving the Mirena IUD in place. Prior to initiating radiation therapy awakened request a pelvic ultrasound to evaluate the endometrial stripe and repeat the ultrasound in 3 months to assess again.    the patient and her caregivers and sister informed that the standard of care would be to perform a hysterectomy although I think this would be exceedingly morbid in this patient. Side effects radiation therapy including diarrhea and radiation cystitis and some fatigue were outlined and they are reassured that this will be discussed in more detail at the time of their radiation therapy consultation.   We will go ahead and schedule a transvaginal ultrasound and radiation therapy consultation. The patient return to see [name] in 3 months following her follow-up ultrasound.   HPI: 54 year old white female seen in consultation request of [name] regarding management of a recently diagnosed endometrial carcinoma. The patient initially presented with vaginal bleeding. She has a past history of an endometrial ablation in 2008. Given her morbid obesity and overall habitus it was difficult to examine the patient the office. Subsequently the patient underwent a D&C on 01/17/2015.  The uterus sounded 8 cm.Adenocarcinoma was found on curettings that appeared to be a grade 2. Since the D&C the patient's had minimal spotting. At the time of D&C a Mirena IUD was placed. It is noted that during the D&C the patient's blood pressure fell she required ICU  admission.    Noted the patient had a CT scan of the abdomen and pelvis in April that showed no abnormalities and peritoneal cavity or retroperitoneum.   the patient has a multitude of medical problems as well as morbid obesity and is not a surgical candidate.  Review of Systems:10 point review of systems is negative except as noted in interval history.   Vitals: Blood pressure 150/63, pulse 69, temperature 98.3 F (36.8 C), temperature source Oral, resp. rate 22, height _0  (1.651 m), weight 422 lb 9.6 oz (191.69 kg), last menstrual period 01/13/2015, SpO2 100 %.  Physical Exam: General : The patient is a healthy woman in no acute distress.  HEENT: normocephalic, extraoccular movements normal; neck is supple without thyromegally       Allergies  Allergen Reactions  . Codeine Other (See Comments)    Heart problems  . Molds & Smuts Shortness Of Breath and Rash  . Allopurinol Other (See Comments)    GI problems   . Ciprofloxacin Other (See Comments)    GI problems  . Doxycycline Other (See Comments)    Gastric problems  . Fexofenadine Other (See Comments)    Hurts joints/pain  . Keflex [Cephalexin] Diarrhea and Nausea Only  . Nitroglycerin Er Nausea And Vomiting  . Oritavancin Itching    Mild-moderate itching.   . Sulfa Drugs Cross Reactors Nausea And Vomiting  . Sulfonamide Derivatives Nausea Only  . Accolate [Zafirlukast] Other (See Comments)    Reaction unknown  . Amoxicillin-Pot Clavulanate Other (See Comments)    Other reaction(s): Unknown  . Morphine Other (See Comments)    unknown  .  Nystatin Rash  . Septra [Sulfamethoxazole-Trimethoprim] Other (See Comments)    unknown    Past Medical History  Diagnosis Date  . OSA (obstructive sleep apnea)   . Morbid obesity   . Depressive disorder, not elsewhere classified   . Diabetes mellitus   . Asthma   . Dyslipidemia   . Clostridium difficile enterocolitis 2012  . Splenomegaly 06/01/2011  . Thrombocytopenia  06/01/2011  . Hepatosplenomegaly   . Gout   . GERD (gastroesophageal reflux disease)   . Osteoarthritis   . Polyneuropathy   . Urinary incontinence   . Amenorrhea   . CHF (congestive heart failure)   . Hyperlipidemia   . Leg swelling   . Nausea   . Weakness   . Palpitations   . PONV (postoperative nausea and vomiting)   . Anginal pain     no chest pain in years  . Dysrhythmia     heart palpations  . Anxiety   . Pneumonia   . Shortness of breath   . Chronic kidney disease     overactive bladder  . Peripheral neuropathy   . DVT (deep venous thrombosis)     Past Surgical History  Procedure Laterality Date  . Cholecystectomy    . Tracheostomy  2001  . Endometrial ablation    . Skin graft    . Finger surgery      ring finger on right hand  . Esophageal dilation  2003  . Breast biopsy      needle core, left  . Dilation and curettage of uterus      times 2  . Tracheal dilitation  01/24/2012    Procedure: TRACHEAL DILITATION;  Surgeon: Melissa Montane, MD;  Location: [facility];  Service: ENT;  Laterality: N/A;  Trache change with possible dilation, from size 6 to size 7 uncuffed  . Amputation Right 11/19/2013    Procedure: AMPUTATION DIGIT;  Surgeon: Cammie Sickle, MD;  Location: [facility];  Service: Orthopedics;  Laterality: Right;  Partial amputation right long finger, Debridement right ring finger   . Amputation Right 06/03/2014    Procedure: REVISION AMPUTATION RIGHT RING FINGER;  Surgeon: Daryll Brod, MD;  Location: [facility];  Service: Orthopedics;  Laterality: Right;  . Dilatation & currettage/hysteroscopy with resectocope N/A 01/17/2015    Procedure: DILATATION & CURETTAGE/HYSTEROSCOPY WITH RESECTOCOPE; Pap Smear;  Surgeon: Ena Dawley, MD;  Location: [facility];  Service: Gynecology;  Laterality: N/A;  . Intrauterine device (iud) insertion N/A 01/17/2015    Procedure: INTRAUTERINE DEVICE (IUD) INSERTION;  Surgeon: Ena Dawley, MD;   Location: [facility];  Service: Gynecology;  Laterality: N/A;    Current Outpatient Prescriptions  Medication Sig Dispense Refill  . acetaminophen (TYLENOL) 500 MG tablet Take 1,000 mg by mouth every 6 (six) hours as needed for mild pain. For pain    . albuterol (PROVENTIL) (2.5 MG/3ML) 0.083% nebulizer solution Take 2.5 mg by nebulization every 4 (four) hours as needed. For shortness of breath    . ALPRAZolam (XANAX) 0.5 MG tablet Take 0.5 mg by mouth 2 (two) times daily as needed. For anxiety    . atorvastatin (LIPITOR) 10 MG tablet Take 10 mg by mouth at bedtime.     . B Complex Vitamins (VITAMIN B COMPLEX PO) Take 1 tablet by mouth daily.     . budesonide (PULMICORT) 0.25 MG/2ML nebulizer solution Take 0.25 mg by nebulization 2 (two) times daily as needed (shortness of breath).    . bumetanide (  BUMEX) 2 MG tablet Take 1 tablet (2 mg total) by mouth daily. May take one extra 51m tab daily as needed for edema. 90 tablet 3  . busPIRone (BUSPAR) 5 MG tablet Take 7.5 mg by mouth 2 (two) times daily.     .Marland KitchenBYSTOLIC 10 MG tablet TAKE 1 TABLET (10 MG TOTAL) BY MOUTH DAILY. 90 tablet 3  . cholestyramine (QUESTRAN) 4 G packet Take 1 packet by mouth as needed. For loose stools, IBS.    . clotrimazole (LOTRIMIN) 1 % cream Apply 1 application topically daily. To stomach    . colchicine 0.6 MG tablet Take 0.6 mg by mouth daily.    . DULoxetine (CYMBALTA) 30 MG capsule TAKE 1 CAPSULE (30 MG TOTAL) BY MOUTH 2 (TWO) TIMES DAILY. 180 capsule 3  . ergocalciferol (VITAMIN D2) 50000 UNITS capsule Take 50,000 Units by mouth once a week. Take on Fridays    . Febuxostat (ULORIC) 80 MG TABS Take 80 mg by mouth at bedtime.     . fesoterodine (TOVIAZ) 4 MG TB24 Take 4 mg by mouth daily.      . fluticasone (FLONASE) 50 MCG/ACT nasal spray Place 1 spray into both nostrils as needed.   3  . gabapentin (NEURONTIN) 100 MG capsule TAKE ONE CAPSULE BY MOUTH EVERY MORNING AND 2 CAPS IN THE AFTERNOON AND 2 CAPS AT BEDTIME  (Patient taking differently: Take 100 mg by mouth 4 (four) times daily. TAKE ONE CAPSULE BY MOUTH EVERY MORNING AND 1 CAPS AT LUNCH TIME, [facility]) 480 capsule 3  . guaiFENesin (MUCINEX) 600 MG 12 hr tablet Take 600 mg by mouth 2 (two) times daily.     .Marland KitchenHYDROcodone-acetaminophen (NORCO/VICODIN) 5-325 MG per tablet Take 1 tablet by mouth every 6 (six) hours as needed. for pain  0  . insulin regular human CONCENTRATED (HUMULIN R) 500 UNIT/ML SOLN injection Inject 40-110 Units into the skin 2 (two) times daily before a meal. Patient injects 110 units before breakfast and 40 units before dinner.    .Marland Kitchenipratropium (ATROVENT) 0.02 % nebulizer solution Take 0.5 mg by nebulization 4 (four) times daily as needed for wheezing or shortness of breath.    . Liraglutide (VICTOZA) 18 MG/3ML SOLN injection Inject 1.8 mg into the skin daily.    .Marland Kitchenlosartan (COZAAR) 100 MG tablet TAKE 1 TABLET (100MG) BY MOUTH DAILY. 90 tablet 2  . meclizine (ANTIVERT) 12.5 MG tablet Take 12.5 mg by mouth at bedtime.   6  . metolazone (ZAROXOLYN) 5 MG tablet TAKE 1 TABLET (5 MG TOTAL) BY MOUTH SEE ADMIN INSTRUCTIONS. 3 TIMES WEEKLY AS NEEDED FOR EDEMA 15 tablet 3  . Multiple Vitamin (MULTIVITAMIN) capsule Take 1 capsule by mouth daily.      . Omega-3 Fatty Acids (OMEGA-3 FISH OIL) 1200 MG CAPS Take 2 capsules (2,400 mg total) by mouth 2 (two) times daily. 120 capsule 6  . omeprazole (PRILOSEC) 40 MG capsule Take 40 mg by mouth 2 (two) times daily.     .Marland KitchenoxyCODONE-acetaminophen (ROXICET) 5-325 MG per tablet Take 1 tablet by mouth every 4 (four) hours as needed for severe pain. 30 tablet 0  . potassium chloride SA (K-DUR,KLOR-CON) 20 MEQ tablet Take 20 mEq by mouth 4 (four) times daily. 1 in AM, 2 at lunch, 1 at supper and 2 at bedtime    . primidone (MYSOLINE) 50 MG tablet Take 1 tablet (50 mg total) by mouth 2 (two) times daily. 180 tablet  3  . Probiotic Product (PROBIOTIC PO) Take 1 tablet by mouth daily.      . promethazine (PHENERGAN) 25 MG tablet Take 25 mg by mouth every 6 (six) hours as needed for nausea or vomiting.    . triamcinolone cream (KENALOG) 0.1 % Apply 1 application topically daily as needed. itching  6  . warfarin (COUMADIN) 5 MG tablet Take 5 mg by mouth daily at 6 PM.      No current facility-administered medications for this visit.    History   Social History  . Marital Status: Single    Spouse Name: N/A  . Number of Children: 0  . Years of Education: N/A   Occupational History  . disabled    Social History Main Topics  . Smoking status: Never Smoker   . Smokeless tobacco: Never Used  . Alcohol Use: No     Comment: occasional - once or twice a year  . Drug Use: No  . Sexual Activity: Not Currently    Birth Control/ Protection: Surgical   Other Topics Concern  . Not on file   Social History Narrative   Lives alone.  She has been on disability since 1999 for morbid obesity.    Single, no children.   She has a caregiver that comes 7-days a week, ~5 hours each day.    Family History  Problem Relation Age of Onset  . Diabetes Father   . Hypertension Father   . Dementia Father     Deceased, 13  . Pneumonia Father   . Heart disease Mother   . Clotting disorder Mother   . Hypertension Mother   . Heart attack Mother     Deceased, 27  . Multiple myeloma Paternal Grandmother   . Cancer Paternal Grandmother     multiple myoloma  . Heart disease Paternal Grandfather   . Kidney disease Maternal Grandmother   . Hypertension Sister   . Cancer Paternal Aunt     breast  . Cancer Cousin     breast - all three      CLARKE-PEARSON,Troye Hiemstra L, MD 01/28/2015, 11:46 AM

## 2015-01-28 NOTE — Patient Instructions (Signed)
You will receive a phone call from the [facility] with a date and time to meet with Dr. Gery Pray with Radiation Oncology to discuss options with pelvic radiation.  Plan to follow up with Dr. Fermin Schwab in three months or sooner if needed.  Please call for any questions or concerns.

## 2015-02-02 ENCOUNTER — Ambulatory Visit (HOSPITAL_COMMUNITY)
Admission: RE | Admit: 2015-02-02 | Discharge: 2015-02-02 | Disposition: A | Discharge status: Home / Self Care | Payer: Medicare Other | Source: Ambulatory Visit | Attending: Gynecologic Oncology | Admitting: Gynecologic Oncology

## 2015-02-02 DIAGNOSIS — C541 Malignant neoplasm of endometrium: Secondary | ICD-10-CM | POA: Diagnosis present

## 2015-02-02 DIAGNOSIS — R938 Abnormal findings on diagnostic imaging of other specified body structures: Secondary | ICD-10-CM | POA: Insufficient documentation

## 2015-02-04 ENCOUNTER — Telehealth: Payer: Self-pay | Admitting: *Deleted

## 2015-02-04 NOTE — Telephone Encounter (Signed)
Attempted to reach patient to review [name] results per Joylene John, NP. Left VM requesting return call from patient.

## 2015-02-04 NOTE — Telephone Encounter (Signed)
Called and spoke with patient. Per Joylene John, NP patient notified of ultrasound results and to continue with current plan and repeat US in 3 months. Patient very appreciative of the call and agreeable to call with any questions or concerns prior to next scheduled appt.

## 2015-02-07 ENCOUNTER — Encounter: Payer: Self-pay | Admitting: Radiation Oncology

## 2015-02-07 NOTE — Progress Notes (Signed)
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  Biopsies revealed:   01/17/15 Diagnosis 1. Endocervix, curettage - ADENOCARCINOMA. - SEE DESCRIPTION. 2. Endometrium, curettage - ADENOCARCINOMA. - SEE DESCRIPTION  Past/Anticipated interventions by Gyn/Onc surgery, if any: Procedure: DILATATION & CURETTAGE/HYSTEROSCOPY WITH RESECTOCOPE; Pap Smear;  Surgeon: Ena Dawley, MD;  Location: [facility];  Service: Gynecology;  Laterality: N/A; Procedure: INTRAUTERINE DEVICE (IUD) INSERTION;  Surgeon: Ena Dawley, MD;  Location: [facility];  Service: Gynecology;  Laterality: N/A;   Past/Anticipated interventions by medical oncology, if any: none  Weight changes, if any: Wt 426.5 lb  Bowel/Bladder complaints, if any: Yes.  , urinary frequency with dribbling and now has blood tinged vaginal discharge  Nausea/Vomiting, if any: Reports nausea over the past weekend  Pain issues, if any:  Level 5 pain- lateral mid back on the left  BP 111/50 mmHg  Pulse 67  Temp(Src) 98.4 F (36.9 C)  Ht 5\' 5"  (1.651 m)  Wt 426 lb 3.2 oz (193.323 kg)  BMI 70.92 kg/m2  SpO2 100%  LMP 01/13/2015   Wt Readings from Last 3 Encounters:  02/10/15 426 lb 3.2 oz (193.323 kg)  01/28/15 422 lb 9.6 oz (191.69 kg)  01/17/15 425 lb (192.779 kg)    SAFETY ISSUES:  Prior radiation? No  Pacemaker/ICD? NO  Possible current pregnancy? no  Is the patient on methotrexate? No  Current Complaints / other details:   Travel by wheelchair.  Difficulty standing.  Accompanied by caregiver

## 2015-02-09 DIAGNOSIS — S60512A Abrasion of left hand, initial encounter: Secondary | ICD-10-CM | POA: Diagnosis not present

## 2015-02-10 ENCOUNTER — Ambulatory Visit
Admission: RE | Admit: 2015-02-10 | Discharge: 2015-02-10 | Disposition: A | Discharge status: Home / Self Care | Payer: Medicare Other | Source: Ambulatory Visit | Attending: Radiation Oncology | Admitting: Radiation Oncology

## 2015-02-10 ENCOUNTER — Encounter: Payer: Self-pay | Admitting: Radiation Oncology

## 2015-02-10 VITALS — BP 111/50 | HR 67 | Temp 98.4°F | Ht 65.0 in | Wt >= 6400 oz

## 2015-02-10 DIAGNOSIS — C541 Malignant neoplasm of endometrium: Secondary | ICD-10-CM | POA: Diagnosis present

## 2015-02-10 DIAGNOSIS — N189 Chronic kidney disease, unspecified: Secondary | ICD-10-CM | POA: Insufficient documentation

## 2015-02-10 DIAGNOSIS — E119 Type 2 diabetes mellitus without complications: Secondary | ICD-10-CM | POA: Diagnosis not present

## 2015-02-10 DIAGNOSIS — E785 Hyperlipidemia, unspecified: Secondary | ICD-10-CM | POA: Insufficient documentation

## 2015-02-10 DIAGNOSIS — J45909 Unspecified asthma, uncomplicated: Secondary | ICD-10-CM | POA: Diagnosis not present

## 2015-02-10 DIAGNOSIS — Z510 Encounter for antineoplastic radiation therapy: Secondary | ICD-10-CM | POA: Diagnosis not present

## 2015-02-10 DIAGNOSIS — G4733 Obstructive sleep apnea (adult) (pediatric): Secondary | ICD-10-CM | POA: Insufficient documentation

## 2015-02-10 DIAGNOSIS — F329 Major depressive disorder, single episode, unspecified: Secondary | ICD-10-CM | POA: Diagnosis not present

## 2015-02-10 DIAGNOSIS — I509 Heart failure, unspecified: Secondary | ICD-10-CM | POA: Insufficient documentation

## 2015-02-10 HISTORY — DX: Malignant neoplasm of endometrium: C54.1

## 2015-02-10 NOTE — Progress Notes (Signed)
Radiation Oncology         [phone] ________________________________  Initial Outpatient Consultation  Name: [name] MRN: [id number]  Date: 02/10/2015  DOB: 07-24-1960  YQ:MVHQION,GEXBMW COLLINS, MD  Clarke-Pearson, Quillian Quince   REFERRING PHYSICIAN: Marti Sleigh  DIAGNOSIS:  Inoperable endometrial cancer in the setting of morbid obesity (BMI 71.1)  HISTORY OF PRESENT ILLNESS:[name] is a 54 y.o. female who is presenting to clinic in regards to her endometrial adenocarcinoma. She is seen out of the courtesy of Dr. Marti Sleigh for consideration for definitive radiation therapy. The patient presented earlier this year with vaginal bleeding. She was seen by Dr. Raphael Gibney and proceeded to undergo D and C on 01/17/2015. The uterus sounded to 8 cm. Adenocarcinoma was found in curettings that appeared to be grade 2. Patient did have a Mirena IUD placed at the time of her D&C. At the time of the patient's D&C her blood pressure fell significantly requiring ICU admission. Patient does have a past history of endometrial ablation back in 2008. In light of the new diagnosis of endometrial cancer the patient was referred to Dr. Fermin Schwab for evaluation. She was deemed not to be a surgical candidate in light of her medical issues. She is now referred to radiation oncology for consideration for treatment.  PREVIOUS RADIATION THERAPY: No  PAST MEDICAL HISTORY:  has a past medical history of OSA (obstructive sleep apnea); Morbid obesity; Depressive disorder, not elsewhere classified; Diabetes mellitus; Asthma; Dyslipidemia; Clostridium difficile enterocolitis (2012); Splenomegaly (06/01/2011); Thrombocytopenia (06/01/2011); Hepatosplenomegaly; Gout; GERD (gastroesophageal reflux disease); Osteoarthritis; Polyneuropathy; Urinary incontinence; Amenorrhea; CHF (congestive heart failure); Hyperlipidemia; Leg swelling; Nausea; Weakness; Palpitations; PONV (postoperative nausea and  vomiting); Anginal pain; Dysrhythmia; Anxiety; Pneumonia; Shortness of breath; Chronic kidney disease; Peripheral neuropathy; DVT (deep venous thrombosis); and Endometrial cancer.    PAST SURGICAL HISTORY: Past Surgical History  Procedure Laterality Date  . Cholecystectomy    . Tracheostomy  2001  . Endometrial ablation    . Skin graft    . Finger surgery      ring finger on right hand  . Esophageal dilation  2003  . Breast biopsy      needle core, left  . Dilation and curettage of uterus      times 2  . Tracheal dilitation  01/24/2012    Procedure: TRACHEAL DILITATION;  Surgeon: Melissa Montane, MD;  Location: [facility];  Service: ENT;  Laterality: N/A;  Trache change with possible dilation, from size 6 to size 7 uncuffed  . Amputation Right 11/19/2013    Procedure: AMPUTATION DIGIT;  Surgeon: Cammie Sickle, MD;  Location: [facility];  Service: Orthopedics;  Laterality: Right;  Partial amputation right long finger, Debridement right ring finger   . Amputation Right 06/03/2014    Procedure: REVISION AMPUTATION RIGHT RING FINGER;  Surgeon: Daryll Brod, MD;  Location: [facility];  Service: Orthopedics;  Laterality: Right;  . Dilatation & currettage/hysteroscopy with resectocope N/A 01/17/2015    Procedure: DILATATION & CURETTAGE/HYSTEROSCOPY WITH RESECTOCOPE; Pap Smear;  Surgeon: Ena Dawley, MD;  Location: [facility];  Service: Gynecology;  Laterality: N/A;  . Intrauterine device (iud) insertion N/A 01/17/2015    Procedure: INTRAUTERINE DEVICE (IUD) INSERTION;  Surgeon: Ena Dawley, MD;  Location: [facility];  Service: Gynecology;  Laterality: N/A;    FAMILY HISTORY: family history includes Cancer in her cousin, paternal aunt, and paternal grandmother; Clotting disorder in her mother; Dementia in her father; Diabetes in her father; Heart attack  in her mother; Heart disease in her mother and paternal grandfather; Hypertension in her father, mother, and sister; Kidney  disease in her maternal grandmother; Multiple myeloma in her paternal grandmother; Pneumonia in her father.  SOCIAL HISTORY:  reports that she has never smoked. She has never used smokeless tobacco. She reports that she does not drink alcohol or use illicit drugs.  ALLERGIES: Codeine; Molds & smuts; Allopurinol; Ciprofloxacin; Doxycycline; Fexofenadine; Keflex; Nitroglycerin er; Oritavancin; Sulfa drugs cross reactors; Sulfonamide derivatives; Accolate; Amoxicillin-pot clavulanate; Morphine; Nystatin; and Septra  MEDICATIONS:  Current Outpatient Prescriptions  Medication Sig Dispense Refill  . acetaminophen (TYLENOL) 500 MG tablet Take 1,000 mg by mouth every 6 (six) hours as needed for mild pain. For pain    . albuterol (PROVENTIL) (2.5 MG/3ML) 0.083% nebulizer solution Take 2.5 mg by nebulization every 4 (four) hours as needed. For shortness of breath    . ALPRAZolam (XANAX) 0.5 MG tablet Take 0.5 mg by mouth 2 (two) times daily as needed. For anxiety    . atorvastatin (LIPITOR) 10 MG tablet Take 10 mg by mouth at bedtime.     . B Complex Vitamins (VITAMIN B COMPLEX PO) Take 1 tablet by mouth daily.     . budesonide (PULMICORT) 0.25 MG/2ML nebulizer solution Take 0.25 mg by nebulization 2 (two) times daily as needed (shortness of breath).    . bumetanide (BUMEX) 2 MG tablet Take 1 tablet (2 mg total) by mouth daily. May take one extra 82m tab daily as needed for edema. 90 tablet 3  . busPIRone (BUSPAR) 5 MG tablet Take 7.5 mg by mouth 2 (two) times daily.     .Marland KitchenBYSTOLIC 10 MG tablet TAKE 1 TABLET (10 MG TOTAL) BY MOUTH DAILY. 90 tablet 3  . cholestyramine (QUESTRAN) 4 G packet Take 1 packet by mouth as needed. For loose stools, IBS.    . clotrimazole (LOTRIMIN) 1 % cream Apply 1 application topically daily. To stomach    . colchicine 0.6 MG tablet Take 0.6 mg by mouth daily.    . DULoxetine (CYMBALTA) 30 MG capsule TAKE 1 CAPSULE (30 MG TOTAL) BY MOUTH 2 (TWO) TIMES DAILY. 180 capsule 3  .  ergocalciferol (VITAMIN D2) 50000 UNITS capsule Take 50,000 Units by mouth once a week. Take on Fridays    . Febuxostat (ULORIC) 80 MG TABS Take 80 mg by mouth at bedtime.     . fesoterodine (TOVIAZ) 4 MG TB24 Take 4 mg by mouth daily.      . fluticasone (FLONASE) 50 MCG/ACT nasal spray Place 1 spray into both nostrils as needed.   3  . gabapentin (NEURONTIN) 100 MG capsule TAKE ONE CAPSULE BY MOUTH EVERY MORNING AND 2 CAPS IN THE AFTERNOON AND 2 CAPS AT BEDTIME (Patient taking differently: Take 100 mg by mouth 4 (four) times daily. TAKE ONE CAPSULE BY MOUTH EVERY MORNING AND 1 CAPS AT LUNCH TIME, [facility] 2 CAPS AT BEDTIME) 480 capsule 3  . guaiFENesin (MUCINEX) 600 MG 12 hr tablet Take 600 mg by mouth 2 (two) times daily.     .Marland KitchenHYDROcodone-acetaminophen (NORCO/VICODIN) 5-325 MG per tablet Take 1 tablet by mouth every 6 (six) hours as needed. for pain  0  . insulin regular human CONCENTRATED (HUMULIN R) 500 UNIT/ML SOLN injection Inject 40-110 Units into the skin 2 (two) times daily before a meal. Patient injects 110 units before breakfast and 40 units before dinner.    .Marland Kitchenipratropium (ATROVENT) 0.02 % nebulizer solution Take 0.5  mg by nebulization 4 (four) times daily as needed for wheezing or shortness of breath.    . Liraglutide (VICTOZA) 18 MG/3ML SOLN injection Inject 1.8 mg into the skin daily.    [name] losartan (COZAAR) 100 MG tablet TAKE 1 TABLET (100MG) BY MOUTH DAILY. 90 tablet 2  . meclizine (ANTIVERT) 12.5 MG tablet Take 12.5 mg by mouth at bedtime.   6  . metolazone (ZAROXOLYN) 5 MG tablet TAKE 1 TABLET (5 MG TOTAL) BY MOUTH SEE ADMIN INSTRUCTIONS. 3 TIMES WEEKLY AS NEEDED FOR EDEMA 15 tablet 3  . Multiple Vitamin (MULTIVITAMIN) capsule Take 1 capsule by mouth daily.      . Omega-3 Fatty Acids (OMEGA-3 FISH OIL) 1200 MG CAPS Take 2 capsules (2,400 mg total) by mouth 2 (two) times daily. 120 capsule 6  . omeprazole (PRILOSEC) 40 MG capsule Take 40 mg by mouth 2 (two) times daily.     [name]  oxyCODONE-acetaminophen (ROXICET) 5-325 MG per tablet Take 1 tablet by mouth every 4 (four) hours as needed for severe pain. 30 tablet 0  . potassium chloride SA (K-DUR,KLOR-CON) 20 MEQ tablet Take 20 mEq by mouth 4 (four) times daily. 1 in AM, 2 at lunch, 1 at supper and 2 at bedtime    . primidone (MYSOLINE) 50 MG tablet Take 1 tablet (50 mg total) by mouth 2 (two) times daily. 180 tablet 3  . Probiotic Product (PROBIOTIC PO) Take 1 tablet by mouth daily.     . promethazine (PHENERGAN) 25 MG tablet Take 25 mg by mouth every 6 (six) hours as needed for nausea or vomiting.    . triamcinolone cream (KENALOG) 0.1 % Apply 1 application topically daily as needed. itching  6  . warfarin (COUMADIN) 5 MG tablet Take 5 mg by mouth daily at 6 PM.      No current facility-administered medications for this encounter.    REVIEW OF SYSTEMS:  A 15 point review of systems is documented in the electronic medical record. This was obtained by the nursing staff. However, I reviewed this with the patient to discuss relevant findings and make appropriate changes.  Pertinent items are noted in HPI. she denies any further vaginal bleeding at this time. She has a clear vaginal discharge however. . She admits she cannot lie flat and breath  comfortably. Patient sits up to sleep. The patient reports sleeping with a ventilator machine at night.   PHYSICAL EXAM: BP 111/50 mmHg  Pulse 67  Temp(Src) 98.4 F (36.9 C)  Ht 5' 5"  (1.651 m)  Wt 426 lb 3.2 oz (193.323 kg)  BMI 70.92 kg/m2  SpO2 100%  LMP 01/13/2015  The patient is alert and oriented x3. She is accompanied by her caregiver today. The patient is quite pleasant and responds promptly to questions. She has good detail of her medical history and her physicians. The pupils are equal round and reactive to light. The extraocular eye movements are intact. The tongue is midline. No secondary infection noted in the oral cavity or posterior pharynx. Examination of the neck  reveals a trach in place. Palpation along the supraclavicular regions reveals what appears to be significant palpable adenopathy bilaterally with the lymph node in the right neck measuring up to approximately 4 x 4 cm in size. No  axillary adenopathy appreciated on clinical exam. The lungs are clear. The heart has a regular rhythm and rate. The abdomen is obese and nontender with normal bowel sounds. Patient is sitting up in wheelchair throughout the evaluation  She has pitting edema in both lower extremities. Patient is able to move all 4 extremities with good strength. Pelvic exam is deferred until simulation and planning day.  ECOG = 3  3 - Symptomatic, >50% in bed, but not bedbound (Capable of only limited self-care, confined to bed or chair 50% or more of waking hours)  LABORATORY DATA:  Lab Results  Component Value Date   WBC 5.3 01/10/2015   HGB 12.2 01/10/2015   HCT 36.4 01/10/2015   MCV 92.6 01/10/2015   PLT 108* 01/10/2015   NEUTROABS 3.8 11/08/2014   Lab Results  Component Value Date   NA 134* 01/17/2015   K 4.0 01/17/2015   CL 101 01/17/2015   CO2 27 01/17/2015   GLUCOSE 274* 01/17/2015   CREATININE 1.10* 01/17/2015   CALCIUM 8.0* 01/17/2015      RADIOGRAPHY: US Transvaginal Non-ob  02/02/2015   CLINICAL DATA:  Endometrial malignancy  EXAM: TRANSABDOMINAL AND TRANSVAGINAL ULTRASOUND OF PELVIS  TECHNIQUE: Both transabdominal and transvaginal ultrasound examinations of the pelvis were performed. Transabdominal technique was performed for global imaging of the pelvis including uterus, ovaries, adnexal regions, and pelvic cul-de-sac. It was necessary to proceed with endovaginal exam following the transabdominal exam to visualize the endometrium and adnexal structures.  COMPARISON:  Pelvic ultrasound of June 11, 2014  FINDINGS: Uterus  Measurements: 9.8 x 4.8 x 5.6 cm. There is a hypoechoic focus in the anterior aspect of the mid body of the uterus measuring 4.1 x 2.7 x 3.7 cm.  In the lower uterine segment at or just above the cervix there is an area of complex increased echogenicity measuring 3.6 x 2.8 x 2.8 cm.  Endometrium  Thickness: 13.4 mm.  No focal abnormality visualized.  Right ovary  The right ovary could not be visualized.  Left ovary  The left ovary could not be visualized.  Other findings  No free fluid.  IMPRESSION: 1. The study is quite limited due to the patient's body habitus. The endometrial stripe measures 13.4 mm. No definite endometrial mass is observed. There is a probable fibroid in the anterior aspect of the midbody of the uterus. There is a complex region of mixed echogenicity in the lower uterine segment at the level of the cervix. This is nonspecific and may be related to the patient's known endometrial malignancy. Direct visualization is recommended. 2. The ovaries could not be demonstrated.   Electronically Signed   By: David  Martinique [name]   On: 02/02/2015 12:11   US Pelvis Complete  02/02/2015   CLINICAL DATA:  Endometrial malignancy  EXAM: TRANSABDOMINAL AND TRANSVAGINAL ULTRASOUND OF PELVIS  TECHNIQUE: Both transabdominal and transvaginal ultrasound examinations of the pelvis were performed. Transabdominal technique was performed for global imaging of the pelvis including uterus, ovaries, adnexal regions, and pelvic cul-de-sac. It was necessary to proceed with endovaginal exam following the transabdominal exam to visualize the endometrium and adnexal structures.  COMPARISON:  Pelvic ultrasound of June 11, 2014  FINDINGS: Uterus  Measurements: 9.8 x 4.8 x 5.6 cm. There is a hypoechoic focus in the anterior aspect of the mid body of the uterus measuring 4.1 x 2.7 x 3.7 cm. In the lower uterine segment at or just above the cervix there is an area of complex increased echogenicity measuring 3.6 x 2.8 x 2.8 cm.  Endometrium  Thickness: 13.4 mm.  No focal abnormality visualized.  Right ovary  The right ovary could not be visualized.  Left ovary  The left  ovary could not  be visualized.  Other findings  No free fluid.  IMPRESSION: 1. The study is quite limited due to the patient's body habitus. The endometrial stripe measures 13.4 mm. No definite endometrial mass is observed. There is a probable fibroid in the anterior aspect of the midbody of the uterus. There is a complex region of mixed echogenicity in the lower uterine segment at the level of the cervix. This is nonspecific and may be related to the patient's known endometrial malignancy. Direct visualization is recommended. 2. The ovaries could not be demonstrated.   Electronically Signed   By: David  Martinique [name]   On: 02/02/2015 12:11      IMPRESSION: Inoperable endometrial cancer in the setting of extreme morbid obesity (BMI 71.1). I discussed pelvic radiation therapy with the patient including course of treatment side effects and potential toxicities. I also discussed with patient radiation would not be curative but may slow progression of her tumor. The patient would not be a candidate for intracavitary brachii therapy treatments as this would require general anesthesia for placement and she would require a recumbent position for several hours. I discussed with the patient also consideration for just proceeding with her IUD at this time since this appears to be slowing her bleeding issues. I also discussed with the patient that I am concerned she may have adenopathy in the supraclavicular regions which potentially could he metastatic spread or a other malignancy. These nodes seem to be somewhat soft and may possibly be lymphoma. I discussed consideration of body scan included in the neck chest abdomen and pelvis for further evaluation. The patient is receptive to proceeding with this diagnostic procedure.   PLAN: The patient will be set up for a CT scan of the neck chest abdomen and pelvis. She will be seen after completion of these studies for further evaluation. If CT scans confirm adenopathy in the  supraclavicular region then she will likely proceed with biopsy of this area.     This document serves as a record of services personally performed by Gery Pray, MD. It was created on his behalf by Lenn Cal, a trained medical scribe. The creation of this record is based on the scribe's personal observations and the provider's statements to them. This document has been checked and approved by the attending provider.    _________________________________  Blair Promise, PhD, MD

## 2015-02-11 ENCOUNTER — Encounter: Payer: Self-pay | Admitting: Cardiovascular Disease

## 2015-02-11 ENCOUNTER — Encounter: Payer: Self-pay | Admitting: Gynecology

## 2015-02-11 ENCOUNTER — Telehealth: Payer: Self-pay | Admitting: *Deleted

## 2015-02-11 NOTE — Telephone Encounter (Signed)
Called patient in regards to message sent earlier today. Left VM requesting return call at patient's convenience.

## 2015-02-11 NOTE — Telephone Encounter (Signed)
CALLED PATIENT TO INFORM OF CT, SPOKE WITH PATIENT AND SHE IS AWARE OF THIS TEST

## 2015-02-14 ENCOUNTER — Telehealth: Payer: Self-pay | Admitting: Nurse Practitioner

## 2015-02-14 NOTE — Telephone Encounter (Signed)
Patient calling clinic to f/u on her email. Patient wants to know if we can "do a whole body scan instead of 3 different scans." she is ordered to have CT of soft tissue neck, chest, and Abd/pelvis on 8/30. Pt also wanting to report spotting since Wednesday with increased bleeding starting last night. She is not saturating pads hourly but amount is "more than spotting." Per Joylene John, NP, CT scan will cover large area of her body and a brain scan is not indicated at this time. Also informed patient that bleeding can be normal after IUD placement and to monitor; notify clinic if bleeding increases. Patient verbalizes understanding of the above.

## 2015-02-17 ENCOUNTER — Telehealth: Payer: Self-pay | Admitting: Gynecologic Oncology

## 2015-02-17 NOTE — Telephone Encounter (Signed)
Returned call to patient.  Patient reporting "period getting heavier."  She has had light spotting since her IUD placement but reports heavier bleeding starting on Sunday night.  Reporting moderate cramping starting Tues night until today which she is taking percocet and vicodin for.  She is going through 4 to 5 maxi pads a day.  Advised that Dr. Fermin Schwab will be notified of the situation and she will be contacted with any further recommendations.  Advised to use a heating pad for symptom relief as well.  Advised to call for any questions or concerns.

## 2015-02-18 ENCOUNTER — Other Ambulatory Visit: Payer: Self-pay | Admitting: *Deleted

## 2015-02-18 ENCOUNTER — Ambulatory Visit (HOSPITAL_COMMUNITY): Payer: Medicare Other

## 2015-02-18 DIAGNOSIS — C541 Malignant neoplasm of endometrium: Secondary | ICD-10-CM

## 2015-02-18 DIAGNOSIS — N939 Abnormal uterine and vaginal bleeding, unspecified: Secondary | ICD-10-CM

## 2015-02-18 MED ORDER — MEGESTROL ACETATE 40 MG PO TABS: 40.0000 mg | ORAL_TABLET | Freq: Three times a day (TID) | ORAL | Status: DC

## 2015-02-18 NOTE — Progress Notes (Signed)
Per Dr. Fermin Schwab, prescription sent to patient's pharmacy for Megace 40mg  tablets to take TID. Called and notified patient of this and she is agreeable to get someone to pick up the medication this morning. Instructed patient to call our office back in the next week or two with an update in regards to the bleeding or sooner with any concerns. Also, instructed patient that there is a risk of blood clots while on Megace - patient states she is already on a blood thinner and is agreeable to report the ED if she notices chest pain, SOB, or any new swelling especially in one leg/calf. No other questions or concerns noted at this time.

## 2015-02-21 ENCOUNTER — Telehealth: Payer: Self-pay | Admitting: *Deleted

## 2015-02-21 NOTE — Telephone Encounter (Signed)
Received call from patient stating that she is still experiencing vaginal bleeding. She states she did start taking the Megace on Friday. Discussed with Dr. Denman George and patient instructed to switch from 40mg  TID to taking 80mg  Megace BID. Patient agreeable to this. Told patient to call our office towards the end of the week with an update or sooner with any additional concerns - patient agreeable to this.

## 2015-02-22 ENCOUNTER — Encounter (HOSPITAL_COMMUNITY): Payer: Self-pay

## 2015-02-22 ENCOUNTER — Ambulatory Visit
Admission: RE | Admit: 2015-02-22 | Discharge: 2015-02-22 | Disposition: A | Discharge status: Home / Self Care | Payer: Medicare Other | Source: Ambulatory Visit | Attending: Radiation Oncology | Admitting: Radiation Oncology

## 2015-02-22 ENCOUNTER — Ambulatory Visit (HOSPITAL_COMMUNITY): Payer: Medicare Other

## 2015-02-22 ENCOUNTER — Ambulatory Visit (HOSPITAL_COMMUNITY)
Admission: RE | Admit: 2015-02-22 | Discharge: 2015-02-22 | Disposition: A | Discharge status: Home / Self Care | Payer: Medicare Other | Source: Ambulatory Visit | Attending: Radiation Oncology | Admitting: Radiation Oncology

## 2015-02-22 DIAGNOSIS — L0291 Cutaneous abscess, unspecified: Secondary | ICD-10-CM | POA: Diagnosis not present

## 2015-02-22 DIAGNOSIS — K7689 Other specified diseases of liver: Secondary | ICD-10-CM | POA: Diagnosis not present

## 2015-02-22 DIAGNOSIS — R935 Abnormal findings on diagnostic imaging of other abdominal regions, including retroperitoneum: Secondary | ICD-10-CM | POA: Diagnosis not present

## 2015-02-22 DIAGNOSIS — Z8619 Personal history of other infectious and parasitic diseases: Secondary | ICD-10-CM | POA: Insufficient documentation

## 2015-02-22 DIAGNOSIS — C541 Malignant neoplasm of endometrium: Secondary | ICD-10-CM

## 2015-02-22 DIAGNOSIS — Z930 Tracheostomy status: Secondary | ICD-10-CM | POA: Diagnosis not present

## 2015-02-22 DIAGNOSIS — M5136 Other intervertebral disc degeneration, lumbar region: Secondary | ICD-10-CM | POA: Insufficient documentation

## 2015-02-22 DIAGNOSIS — I517 Cardiomegaly: Secondary | ICD-10-CM | POA: Insufficient documentation

## 2015-02-22 DIAGNOSIS — R161 Splenomegaly, not elsewhere classified: Secondary | ICD-10-CM | POA: Diagnosis not present

## 2015-02-22 DIAGNOSIS — E119 Type 2 diabetes mellitus without complications: Secondary | ICD-10-CM | POA: Insufficient documentation

## 2015-02-22 DIAGNOSIS — J989 Respiratory disorder, unspecified: Secondary | ICD-10-CM | POA: Insufficient documentation

## 2015-02-22 DIAGNOSIS — M47896 Other spondylosis, lumbar region: Secondary | ICD-10-CM | POA: Diagnosis not present

## 2015-02-22 DIAGNOSIS — R599 Enlarged lymph nodes, unspecified: Secondary | ICD-10-CM | POA: Insufficient documentation

## 2015-02-22 DIAGNOSIS — G4733 Obstructive sleep apnea (adult) (pediatric): Secondary | ICD-10-CM | POA: Diagnosis not present

## 2015-02-22 DIAGNOSIS — M5386 Other specified dorsopathies, lumbar region: Secondary | ICD-10-CM | POA: Diagnosis not present

## 2015-02-22 LAB — BUN AND CREATININE (CC13)
BUN: 12 mg/dL (ref 7.0–26.0)
Creatinine: 1.1 mg/dL (ref 0.6–1.1)
EGFR: 60 mL/min/{1.73_m2} — ABNORMAL LOW (ref >90)

## 2015-02-22 MED ORDER — IOHEXOL 300 MG/ML  SOLN
100.0000 mL | Freq: Once | INTRAMUSCULAR | Status: AC | PRN
  Administered 2015-02-22: 100 mL via INTRAVENOUS

## 2015-02-23 NOTE — Addendum Note (Signed)
Encounter addended by: Jacqulyn Liner, RN on: 02/23/2015 11:30 AM<BR>     Documentation filed: Charges VN

## 2015-03-01 ENCOUNTER — Encounter: Payer: Self-pay | Admitting: *Deleted

## 2015-03-01 NOTE — Progress Notes (Signed)
[employer] Psychosocial Distress Screening Clinical Social Work  Clinical Social Work was referred by distress screening protocol.  The patient scored a 5 on the Psychosocial Distress Thermometer which indicates moderate distress. Clinical Social Worker contacted patient by phone to assess for distress and other psychosocial needs. [name] shared she is doing well and plans to complete CT Simulation for radiation treatment tomorrow.  She reported her concerns regarding information have resolved after meeting with the oncologists.  She continues to experience anxiety/depression periodically, states "I've got good days, and bad days when it's hard to deal with what's going on".  CSW validated and normalized patients feelings of anxiety.  The patient feels she has adequate coping skills to deal with anxiety and is not interested in further support at this time.  CSW encouraged patient to call with any questions or concerns.  ONCBCN DISTRESS SCREENING 02/10/2015  Screening Type Initial Screening  Distress experienced in past week (1-10) 5  Emotional problem type Nervousness/Anxiety;Depression  Information Concerns Type Lack of info about diagnosis;Lack of info about treatment;Lack of info about complementary therapy choices;Lack of info about maintaining fitness  Physical Problem type Sleep/insomnia  Physician notified of physical symptoms Yes    Clinical Social Worker follow up needed: No.  If yes, follow up plan:  [name], MSW, LCSW, OSW-C Clinical Social Worker [facility] [phone]

## 2015-03-02 ENCOUNTER — Ambulatory Visit: Admission: RE | Admit: 2015-03-02 | Payer: Medicare Other | Source: Ambulatory Visit | Admitting: Radiation Oncology

## 2015-03-02 ENCOUNTER — Ambulatory Visit
Admission: RE | Admit: 2015-03-02 | Discharge: 2015-03-02 | Disposition: A | Discharge status: Home / Self Care | Payer: Medicare Other | Source: Ambulatory Visit | Attending: Radiation Oncology | Admitting: Radiation Oncology

## 2015-03-02 DIAGNOSIS — C541 Malignant neoplasm of endometrium: Secondary | ICD-10-CM

## 2015-03-02 MED ORDER — OXYCODONE-ACETAMINOPHEN 5-325 MG PO TABS: 1.0000 | ORAL_TABLET | ORAL | Status: DC | PRN

## 2015-03-02 NOTE — Progress Notes (Addendum)
Radiation Oncology         [phone] ________________________________  Name: [name] MRN: [id number]  Date: 03/02/2015  DOB: 06-19-1961  Reevaluation Note.   CC: [name], MD  Marti Sleigh,*    ICD-9-CM ICD-10-CM   1. Endometrial cancer 182.0 C54.1     Diagnosis:  Inoperable endometrial cancer in the setting of morbid obesity (BMI 71.1)    Narrative:  The patient returns today for further evaluation. Since her initial evaluation on August 18 patient has undergone a CT scan of the neck and chest abdomen and pelvis. There was no suspicious adenopathy in the supraclavicular region which I was questioning. Patient was noted to have prominent soft tissue in the nasopharynx and has had increasing  Nasal congestion.  She denies any epistaxis or pain in the face area. Patient continues to bleed regularly despite an IUD as well as Megace therapy.  Patient's weight today is 422 pounds which exceeds weight limits for our treatment machines. I discussed this issue with the patient and she will meet with nutrition for dietary pointers concerning weight loss. We will also check to see if other facilities and [place] potentially could treat the patient with different radiation therapy equipment.    Patient has been having some minor problems with her trach and in light of the CT findings showing fullness in the nasopharynx area the patient will be set up to see Dr. Janace Hoard for reevaluation.            Patient is having pain in her lower back and pelvis area and night and have given her limited refill on her Percocet prescription.                  Radiographic Findings: Ct Soft Tissue Neck W Contrast  02/22/2015   CLINICAL DATA:  Uterine cancer diagnosed in July 2016. Adenopathy in the neck. Lower abdominal pain. Back pain. Sleep apnea, tracheostomy.  EXAM: CT OF THE NECK WITH CONTRAST  CT OF THE CHEST WITH CONTRAST  CT OF THE ABDOMEN AND PELVIS WITH CONTRAST   TECHNIQUE: Multidetector CT imaging of the neck was performed with intravenous contrast.; Multidetector CT imaging of the abdomen and pelvis was performed following the standard protocol during bolus administration of intravenous contrast.; Multidetector CT imaging of the chest was performed following the standard protocol during bolus administration of intravenous contrast.  CONTRAST:  124mL OMNIPAQUE IOHEXOL 300 MG/ML  SOLN  COMPARISON:  Multiple exams, including 10/01/2014 and 08/18/2012  FINDINGS: CT NECK FINDINGS  Scattered small lymph nodes in the neck are not pathologically enlarged by size criteria. Index submental lymph node on the left is 9 mm in short axis.  There does appear to be some mild prominence of the soft palate and the palatine tonsils, with occlusion of the nasopharyngeal airway and narrowed oropharyngeal airway resulting. Symmetric soft tissue thickening along the posterior nasopharynx.  CT CHEST FINDINGS  Mediastinum/Nodes: Mild cardiomegaly. No pathologic thoracic adenopathy. Tracheostomy tube noted. There is dilution of the contrast pool related to the patient's body habitus. Collateral venous structures noted along the anterior chest wall in the subcutaneous tissues.  Lungs/Pleura: Airway thickening is present, suggesting bronchitis or reactive airways disease. Subsegmental atelectasis in the lingula. No discrete pulmonary nodules identified. Mild mosaic attenuation in the lungs suggesting air trapping.  Musculoskeletal: Thoracic spondylosis.  CT ABDOMEN PELVIS FINDINGS  Hepatobiliary: Cholecystectomy.  Pancreas: Unremarkable  Spleen: Splenomegaly, elongated spleen vertically, approximately 1000 cc volume.  Adrenals/Urinary Tract: Unremarkable  Stomach/Bowel:  Unremarkable  Vascular/Lymphatic: Mild aortoiliac atherosclerosis. Mildly enlarged celiac chain lymph node 1.1 cm in short axis, previously 1.0 cm. Right common iliac lymph node 1.4 cm in diameter, image 94 series 2. Small left  external iliac lymph nodes observed. Bilateral inguinal lymph nodes with fatty hila appear stable.  Reproductive: Y-shaped linear metal structure is present along the endometrial canal, with stem primarily in the cervix, but not all the way to the external os -query therapy related versus IUD.  Other: No supplemental non-categorized findings.  Musculoskeletal: Subcutaneous stranding in the pannus raises the possibility of panniculitis. We did not scan all the way through the pannus. Lower lumbar spondylosis and degenerative disc disease with prominent bilateral foraminal impingement at the L4-5 level.  IMPRESSION: 1. There is a Y-shaped linear metal structure along the endometrial canal with stem primarily in the cervix. This may be related to therapy or may be an IUD. 2. The only significantly enlarged lymph node is a right common iliac lymph node on image 94 of series 2, which conceivably could represent metastatic disease or a which could be reactive. 3. Suspected panniculitis. 4. No cervical adenopathy. Prominence of the soft palate and palatine tonsils with occlusion of the nasopharyngeal airway and narrowed or a pharyngeal airway ; the patient has a tracheostomy. 5. Splenomegaly, with elongated spleen vertically. 6. Airway thickening is present, suggesting bronchitis or reactive airways disease. 7. Mild cardiomegaly. 8. Prominent bilateral foraminal impingement at L4-5 due to spondylosis and degenerative disc disease. 9.  Body habitus reduces diagnostic sensitivity and specificity.   Electronically Signed   By: Van Clines [name]   On: 02/22/2015 15:16   Ct Chest W Contrast  02/22/2015   CLINICAL DATA:  Uterine cancer diagnosed in July 2016. Adenopathy in the neck. Lower abdominal pain. Back pain. Sleep apnea, tracheostomy.  EXAM: CT OF THE NECK WITH CONTRAST  CT OF THE CHEST WITH CONTRAST  CT OF THE ABDOMEN AND PELVIS WITH CONTRAST  TECHNIQUE: Multidetector CT imaging of the neck was performed with  intravenous contrast.; Multidetector CT imaging of the abdomen and pelvis was performed following the standard protocol during bolus administration of intravenous contrast.; Multidetector CT imaging of the chest was performed following the standard protocol during bolus administration of intravenous contrast.  CONTRAST:  132mL OMNIPAQUE IOHEXOL 300 MG/ML  SOLN  COMPARISON:  Multiple exams, including 10/01/2014 and 08/18/2012  FINDINGS: CT NECK FINDINGS  Scattered small lymph nodes in the neck are not pathologically enlarged by size criteria. Index submental lymph node on the left is 9 mm in short axis.  There does appear to be some mild prominence of the soft palate and the palatine tonsils, with occlusion of the nasopharyngeal airway and narrowed oropharyngeal airway resulting. Symmetric soft tissue thickening along the posterior nasopharynx.  CT CHEST FINDINGS  Mediastinum/Nodes: Mild cardiomegaly. No pathologic thoracic adenopathy. Tracheostomy tube noted. There is dilution of the contrast pool related to the patient's body habitus. Collateral venous structures noted along the anterior chest wall in the subcutaneous tissues.  Lungs/Pleura: Airway thickening is present, suggesting bronchitis or reactive airways disease. Subsegmental atelectasis in the lingula. No discrete pulmonary nodules identified. Mild mosaic attenuation in the lungs suggesting air trapping.  Musculoskeletal: Thoracic spondylosis.  CT ABDOMEN PELVIS FINDINGS  Hepatobiliary: Cholecystectomy.  Pancreas: Unremarkable  Spleen: Splenomegaly, elongated spleen vertically, approximately 1000 cc volume.  Adrenals/Urinary Tract: Unremarkable  Stomach/Bowel: Unremarkable  Vascular/Lymphatic: Mild aortoiliac atherosclerosis. Mildly enlarged celiac chain lymph node 1.1 cm in short axis, previously 1.0 cm. Right  common iliac lymph node 1.4 cm in diameter, image 94 series 2. Small left external iliac lymph nodes observed. Bilateral inguinal lymph nodes with  fatty hila appear stable.  Reproductive: Y-shaped linear metal structure is present along the endometrial canal, with stem primarily in the cervix, but not all the way to the external os -query therapy related versus IUD.  Other: No supplemental non-categorized findings.  Musculoskeletal: Subcutaneous stranding in the pannus raises the possibility of panniculitis. We did not scan all the way through the pannus. Lower lumbar spondylosis and degenerative disc disease with prominent bilateral foraminal impingement at the L4-5 level.  IMPRESSION: 1. There is a Y-shaped linear metal structure along the endometrial canal with stem primarily in the cervix. This may be related to therapy or may be an IUD. 2. The only significantly enlarged lymph node is a right common iliac lymph node on image 94 of series 2, which conceivably could represent metastatic disease or a which could be reactive. 3. Suspected panniculitis. 4. No cervical adenopathy. Prominence of the soft palate and palatine tonsils with occlusion of the nasopharyngeal airway and narrowed or a pharyngeal airway ; the patient has a tracheostomy. 5. Splenomegaly, with elongated spleen vertically. 6. Airway thickening is present, suggesting bronchitis or reactive airways disease. 7. Mild cardiomegaly. 8. Prominent bilateral foraminal impingement at L4-5 due to spondylosis and degenerative disc disease. 9.  Body habitus reduces diagnostic sensitivity and specificity.   Electronically Signed   By: Van Clines [name]   On: 02/22/2015 15:16   US Transvaginal Non-ob  02/02/2015   CLINICAL DATA:  Endometrial malignancy  EXAM: TRANSABDOMINAL AND TRANSVAGINAL ULTRASOUND OF PELVIS  TECHNIQUE: Both transabdominal and transvaginal ultrasound examinations of the pelvis were performed. Transabdominal technique was performed for global imaging of the pelvis including uterus, ovaries, adnexal regions, and pelvic cul-de-sac. It was necessary to proceed with endovaginal exam  following the transabdominal exam to visualize the endometrium and adnexal structures.  COMPARISON:  Pelvic ultrasound of June 11, 2014  FINDINGS: Uterus  Measurements: 9.8 x 4.8 x 5.6 cm. There is a hypoechoic focus in the anterior aspect of the mid body of the uterus measuring 4.1 x 2.7 x 3.7 cm. In the lower uterine segment at or just above the cervix there is an area of complex increased echogenicity measuring 3.6 x 2.8 x 2.8 cm.  Endometrium  Thickness: 13.4 mm.  No focal abnormality visualized.  Right ovary  The right ovary could not be visualized.  Left ovary  The left ovary could not be visualized.  Other findings  No free fluid.  IMPRESSION: 1. The study is quite limited due to the patient's body habitus. The endometrial stripe measures 13.4 mm. No definite endometrial mass is observed. There is a probable fibroid in the anterior aspect of the midbody of the uterus. There is a complex region of mixed echogenicity in the lower uterine segment at the level of the cervix. This is nonspecific and may be related to the patient's known endometrial malignancy. Direct visualization is recommended. 2. The ovaries could not be demonstrated.   Electronically Signed   By: David  Martinique [name]   On: 02/02/2015 12:11   US Pelvis Complete  02/02/2015   CLINICAL DATA:  Endometrial malignancy  EXAM: TRANSABDOMINAL AND TRANSVAGINAL ULTRASOUND OF PELVIS  TECHNIQUE: Both transabdominal and transvaginal ultrasound examinations of the pelvis were performed. Transabdominal technique was performed for global imaging of the pelvis including uterus, ovaries, adnexal regions, and pelvic cul-de-sac. It was necessary to proceed  with endovaginal exam following the transabdominal exam to visualize the endometrium and adnexal structures.  COMPARISON:  Pelvic ultrasound of June 11, 2014  FINDINGS: Uterus  Measurements: 9.8 x 4.8 x 5.6 cm. There is a hypoechoic focus in the anterior aspect of the mid body of the uterus measuring  4.1 x 2.7 x 3.7 cm. In the lower uterine segment at or just above the cervix there is an area of complex increased echogenicity measuring 3.6 x 2.8 x 2.8 cm.  Endometrium  Thickness: 13.4 mm.  No focal abnormality visualized.  Right ovary  The right ovary could not be visualized.  Left ovary  The left ovary could not be visualized.  Other findings  No free fluid.  IMPRESSION: 1. The study is quite limited due to the patient's body habitus. The endometrial stripe measures 13.4 mm. No definite endometrial mass is observed. There is a probable fibroid in the anterior aspect of the midbody of the uterus. There is a complex region of mixed echogenicity in the lower uterine segment at the level of the cervix. This is nonspecific and may be related to the patient's known endometrial malignancy. Direct visualization is recommended. 2. The ovaries could not be demonstrated.   Electronically Signed   By: David  Martinique [name]   On: 02/02/2015 12:11   Ct Abdomen Pelvis W Contrast  02/22/2015   CLINICAL DATA:  Uterine cancer diagnosed in July 2016. Adenopathy in the neck. Lower abdominal pain. Back pain. Sleep apnea, tracheostomy.  EXAM: CT OF THE NECK WITH CONTRAST  CT OF THE CHEST WITH CONTRAST  CT OF THE ABDOMEN AND PELVIS WITH CONTRAST  TECHNIQUE: Multidetector CT imaging of the neck was performed with intravenous contrast.; Multidetector CT imaging of the abdomen and pelvis was performed following the standard protocol during bolus administration of intravenous contrast.; Multidetector CT imaging of the chest was performed following the standard protocol during bolus administration of intravenous contrast.  CONTRAST:  131mL OMNIPAQUE IOHEXOL 300 MG/ML  SOLN  COMPARISON:  Multiple exams, including 10/01/2014 and 08/18/2012  FINDINGS: CT NECK FINDINGS  Scattered small lymph nodes in the neck are not pathologically enlarged by size criteria. Index submental lymph node on the left is 9 mm in short axis.  There does appear to  be some mild prominence of the soft palate and the palatine tonsils, with occlusion of the nasopharyngeal airway and narrowed oropharyngeal airway resulting. Symmetric soft tissue thickening along the posterior nasopharynx.  CT CHEST FINDINGS  Mediastinum/Nodes: Mild cardiomegaly. No pathologic thoracic adenopathy. Tracheostomy tube noted. There is dilution of the contrast pool related to the patient's body habitus. Collateral venous structures noted along the anterior chest wall in the subcutaneous tissues.  Lungs/Pleura: Airway thickening is present, suggesting bronchitis or reactive airways disease. Subsegmental atelectasis in the lingula. No discrete pulmonary nodules identified. Mild mosaic attenuation in the lungs suggesting air trapping.  Musculoskeletal: Thoracic spondylosis.  CT ABDOMEN PELVIS FINDINGS  Hepatobiliary: Cholecystectomy.  Pancreas: Unremarkable  Spleen: Splenomegaly, elongated spleen vertically, approximately 1000 cc volume.  Adrenals/Urinary Tract: Unremarkable  Stomach/Bowel: Unremarkable  Vascular/Lymphatic: Mild aortoiliac atherosclerosis. Mildly enlarged celiac chain lymph node 1.1 cm in short axis, previously 1.0 cm. Right common iliac lymph node 1.4 cm in diameter, image 94 series 2. Small left external iliac lymph nodes observed. Bilateral inguinal lymph nodes with fatty hila appear stable.  Reproductive: Y-shaped linear metal structure is present along the endometrial canal, with stem primarily in the cervix, but not all the way to the external os -query  therapy related versus IUD.  Other: No supplemental non-categorized findings.  Musculoskeletal: Subcutaneous stranding in the pannus raises the possibility of panniculitis. We did not scan all the way through the pannus. Lower lumbar spondylosis and degenerative disc disease with prominent bilateral foraminal impingement at the L4-5 level.  IMPRESSION: 1. There is a Y-shaped linear metal structure along the endometrial canal with stem  primarily in the cervix. This may be related to therapy or may be an IUD. 2. The only significantly enlarged lymph node is a right common iliac lymph node on image 94 of series 2, which conceivably could represent metastatic disease or a which could be reactive. 3. Suspected panniculitis. 4. No cervical adenopathy. Prominence of the soft palate and palatine tonsils with occlusion of the nasopharyngeal airway and narrowed or a pharyngeal airway ; the patient has a tracheostomy. 5. Splenomegaly, with elongated spleen vertically. 6. Airway thickening is present, suggesting bronchitis or reactive airways disease. 7. Mild cardiomegaly. 8. Prominent bilateral foraminal impingement at L4-5 due to spondylosis and degenerative disc disease. 9.  Body habitus reduces diagnostic sensitivity and specificity.   Electronically Signed   By: Van Clines [name]   On: 02/22/2015 15:16    Impression:  Inoperable endometrial cancer in the setting of morbid obesity (BMI 71.1).  As above the patient exceeds the weight limits for treatment on our linear accelerators.  Plan:  #1. Nutrition consult for weight loss  #2 ENT referral, Dr. Janace Hoard  ____________________________________ Gery Pray, MD

## 2015-03-07 ENCOUNTER — Other Ambulatory Visit: Payer: Self-pay | Admitting: Gynecologic Oncology

## 2015-03-07 DIAGNOSIS — N939 Abnormal uterine and vaginal bleeding, unspecified: Secondary | ICD-10-CM

## 2015-03-07 DIAGNOSIS — C541 Malignant neoplasm of endometrium: Secondary | ICD-10-CM

## 2015-03-07 MED ORDER — MEGESTROL ACETATE 40 MG PO TABS: 80.0000 mg | ORAL_TABLET | Freq: Two times a day (BID) | ORAL | Status: DC

## 2015-03-07 NOTE — Progress Notes (Signed)
Refill given per pt request.

## 2015-03-09 ENCOUNTER — Encounter (HOSPITAL_BASED_OUTPATIENT_CLINIC_OR_DEPARTMENT_OTHER): Payer: Medicare Other | Attending: Surgery

## 2015-03-09 DIAGNOSIS — Z930 Tracheostomy status: Secondary | ICD-10-CM | POA: Diagnosis not present

## 2015-03-09 DIAGNOSIS — E11622 Type 2 diabetes mellitus with other skin ulcer: Secondary | ICD-10-CM | POA: Insufficient documentation

## 2015-03-09 DIAGNOSIS — S60151A Contusion of right little finger with damage to nail, initial encounter: Secondary | ICD-10-CM | POA: Diagnosis not present

## 2015-03-09 DIAGNOSIS — S60512A Abrasion of left hand, initial encounter: Secondary | ICD-10-CM | POA: Diagnosis present

## 2015-03-09 DIAGNOSIS — X58XXXA Exposure to other specified factors, initial encounter: Secondary | ICD-10-CM | POA: Diagnosis not present

## 2015-03-09 DIAGNOSIS — S60132A Contusion of left middle finger with damage to nail, initial encounter: Secondary | ICD-10-CM | POA: Diagnosis not present

## 2015-03-09 DIAGNOSIS — S60511A Abrasion of right hand, initial encounter: Secondary | ICD-10-CM | POA: Diagnosis not present

## 2015-03-09 DIAGNOSIS — E1142 Type 2 diabetes mellitus with diabetic polyneuropathy: Secondary | ICD-10-CM | POA: Diagnosis not present

## 2015-03-11 ENCOUNTER — Telehealth: Payer: Self-pay | Admitting: Oncology

## 2015-03-11 NOTE — Telephone Encounter (Signed)
[name] called and wants to know the names of our treatment machines.  Called her back and advised her of the names.  Also asked her if she has seen the ENT doctor yet.  She said she has an appointment with Dr. Janace Hoard on 03/28/15.

## 2015-03-11 NOTE — Telephone Encounter (Signed)
Tacie called back and said she had talked to radiation oncology at [facility] and said they have a linear accelerator that can hold 440 lbs.  She is wondering if she can have treatment there.  Advised her that Dr. Sondra Come will be notified and we will call her back.

## 2015-03-14 ENCOUNTER — Ambulatory Visit: Payer: Medicare Other | Admitting: Nutrition

## 2015-03-14 NOTE — Progress Notes (Signed)
54 year old female diagnosed with endometrial cancer.  She is a patient of Dr. Sondra Come.  Past medical history includes OSA, depression, diabetes, arthritis, gout, GERD, CHF, hyperlipidemia, postop nausea/vomiting, anxiety, chronic kidney disease, tracheostomy and DVT.  Medications include Xanax, Lipitor, B complex, Questran Cymbalta, Humulin, Victoza, multivitamin, probiotic, Phenergan, Coumadin, and Megace.  Labs include hemoglobin A1c 8.6.  Height: 65 inches. Weight: 426.2 pounds August 18. BMI: 79 2.  Patient has inoperable cancer in the setting of morbid obesity and was advised to lose weight, to enable her to receive radiation therapy. Patient reports she has been to many dietitians and has had weight problems her entire life. Patient presents in a wheelchair and has difficulty moving around. She uses a walker at home. Patient unable to stand on the scale to be weighed today. Patient has a caregiver who helps prepare her meals. States she typically eats 2 meals daily. Breakfast consists of eggs, grits, toast and jelly.  Dinner consists of meat, 1 starch, and 1-2 vegetables.  Patient snacks on peanut butter crackers.   Patient drinks one gallon of regular Kool-Aid a day (uses 2 cups of sugar;~1500 calories from sugar).  Patient has "intolerance" to artificial sweeteners.  Nutrition diagnosis:  Obesity related to physical inactivity and excessive energy intake as evidenced by obesity, grade 3: BMI of 79.2.  Intervention:  Educated patient on reducing consumption of excess sugar which is providing approximately 1500 cal daily with no nutritional value. Educated on ways to decrease Kool-Aid consumption and increase water. Recommended patient increase vegetables and whole gr while limiting concentrated sugars.  Stressed importance of portion control. Recommended patient begin arm and leg exercises as well as increase walking as permitted/safe. States she has exercises given to her by  physical therapist. Provided fact sheets.  Questions were answered.  Teach back method used.  Contact information was provided.  Monitoring, evaluation, goals:  Patient will increase physical activity and follow decreased concentrated sweets diet to achieve weight loss of 2-3 pounds weekly.  Next visit: Patient to contact me for questions or concerns.  **Disclaimer: This note was dictated with voice recognition software. Similar sounding words can inadvertently be transcribed and this note may contain transcription errors which may not have been corrected upon publication of note.**

## 2015-03-18 ENCOUNTER — Other Ambulatory Visit: Payer: Self-pay | Admitting: *Deleted

## 2015-03-18 DIAGNOSIS — C541 Malignant neoplasm of endometrium: Secondary | ICD-10-CM

## 2015-03-18 DIAGNOSIS — N939 Abnormal uterine and vaginal bleeding, unspecified: Secondary | ICD-10-CM

## 2015-03-18 MED ORDER — MEGESTROL ACETATE 40 MG PO TABS: 80.0000 mg | ORAL_TABLET | Freq: Two times a day (BID) | ORAL | Status: DC

## 2015-03-18 NOTE — Telephone Encounter (Signed)
Received phone call from patient stating she only has enough Megace for 15 days, not for an entire month. Called patient's pharmacy and verified that only 15 days worth was prescribed. New prescription sent for 30 days worth with refills. Patient notified that this has been done. No other concerns noted at this time.

## 2015-04-08 ENCOUNTER — Telehealth: Payer: Self-pay | Admitting: Oncology

## 2015-04-08 NOTE — Telephone Encounter (Signed)
[name] called back and said she saw Dr. Janace Hoard last week.  She said he did not find anything.  She also said "she has not eartly idea if she has lost weight."  Called and left a message for Dr. Janace Hoard office to request the office notes.

## 2015-04-08 NOTE — Telephone Encounter (Signed)
Called and left a message for [name] to see if she has seen her ENT doctor yet.  Requested a return call.

## 2015-04-12 ENCOUNTER — Telehealth: Payer: Self-pay | Admitting: *Deleted

## 2015-04-12 NOTE — Telephone Encounter (Signed)
Received phone call from patient stating she has a boil lesion on her vulva area and requesting sooner appointment than 04/28/15 with Dr. Fermin Schwab. Patient states she has had this happen one time previously and that her primary care doctor instructed her then to go to the ED and she had it lanced and then had to be admitted for 3 days. She states she is trying to be proactive this time. Discussed with Joylene John, NP and instructed patient to call her GYN, Dr. Raphael Gibney, or report to Urgent Carefor further evaluation. Told patient that if she has any further concerns to please call our office back - patient agreeable to this.

## 2015-04-12 NOTE — Telephone Encounter (Signed)
Received phone call from patient with update. She states the nurse at Dr. Doran Stabler office instructed her to report to the emergency department. Patient agreeable to this.

## 2015-04-13 ENCOUNTER — Encounter (HOSPITAL_COMMUNITY): Payer: Self-pay | Admitting: *Deleted

## 2015-04-13 ENCOUNTER — Encounter (HOSPITAL_BASED_OUTPATIENT_CLINIC_OR_DEPARTMENT_OTHER): Payer: Medicare Other | Attending: Surgery

## 2015-04-13 ENCOUNTER — Inpatient Hospital Stay (HOSPITAL_COMMUNITY)
Admission: AD | Admit: 2015-04-13 | Discharge: 2015-04-13 | Disposition: A | Discharge status: Home / Self Care | Payer: Medicare Other | Source: Ambulatory Visit | Attending: Obstetrics and Gynecology | Admitting: Obstetrics and Gynecology

## 2015-04-13 DIAGNOSIS — Z8614 Personal history of Methicillin resistant Staphylococcus aureus infection: Secondary | ICD-10-CM | POA: Diagnosis not present

## 2015-04-13 DIAGNOSIS — I5032 Chronic diastolic (congestive) heart failure: Secondary | ICD-10-CM | POA: Insufficient documentation

## 2015-04-13 DIAGNOSIS — Z930 Tracheostomy status: Secondary | ICD-10-CM | POA: Insufficient documentation

## 2015-04-13 DIAGNOSIS — E782 Mixed hyperlipidemia: Secondary | ICD-10-CM | POA: Insufficient documentation

## 2015-04-13 DIAGNOSIS — N189 Chronic kidney disease, unspecified: Secondary | ICD-10-CM | POA: Diagnosis not present

## 2015-04-13 DIAGNOSIS — Z7901 Long term (current) use of anticoagulants: Secondary | ICD-10-CM | POA: Insufficient documentation

## 2015-04-13 DIAGNOSIS — C541 Malignant neoplasm of endometrium: Secondary | ICD-10-CM | POA: Insufficient documentation

## 2015-04-13 DIAGNOSIS — Z79899 Other long term (current) drug therapy: Secondary | ICD-10-CM | POA: Diagnosis not present

## 2015-04-13 DIAGNOSIS — I129 Hypertensive chronic kidney disease with stage 1 through stage 4 chronic kidney disease, or unspecified chronic kidney disease: Secondary | ICD-10-CM | POA: Diagnosis not present

## 2015-04-13 DIAGNOSIS — N9089 Other specified noninflammatory disorders of vulva and perineum: Secondary | ICD-10-CM | POA: Insufficient documentation

## 2015-04-13 DIAGNOSIS — E119 Type 2 diabetes mellitus without complications: Secondary | ICD-10-CM | POA: Insufficient documentation

## 2015-04-13 DIAGNOSIS — L293 Anogenital pruritus, unspecified: Secondary | ICD-10-CM | POA: Diagnosis not present

## 2015-04-13 MED ORDER — KETOCONAZOLE 2 % EX CREA: 1.0000 "application " | TOPICAL_CREAM | Freq: Every day | CUTANEOUS | Status: DC

## 2015-04-13 NOTE — Discharge Instructions (Signed)
Cutaneous Candidiasis °Cutaneous candidiasis is a condition in which there is an overgrowth of yeast (candida) on the skin. Yeast normally live on the skin, but in small enough numbers not to cause any symptoms. In certain cases, increased growth of the yeast may cause an actual yeast infection. This kind of infection usually occurs in areas of the skin that are constantly warm and moist, such as the armpits or the groin. Yeast is the most common cause of diaper rash in babies and in people who cannot control their bowel movements (incontinence). °CAUSES  °The fungus that most often causes cutaneous candidiasis is Candida albicans. Conditions that can increase the risk of getting a yeast infection of the skin include: °· Obesity. °· Pregnancy. °· Diabetes. °· Taking antibiotic medicine. °· Taking birth control pills. °· Taking steroid medicines. °· Thyroid disease. °· An iron or zinc deficiency. °· Problems with the immune system. °SYMPTOMS  °· Red, swollen area of the skin. °· Bumps on the skin. °· Itchiness. °DIAGNOSIS  °The diagnosis of cutaneous candidiasis is usually based on its appearance. Light scrapings of the skin may also be taken and viewed under a microscope to identify the presence of yeast. °TREATMENT  °Antifungal creams may be applied to the infected skin. In severe cases, oral medicines may be needed.  °HOME CARE INSTRUCTIONS  °· Keep your skin clean and dry. °· Maintain a healthy weight. °· If you have diabetes, keep your blood sugar under control. °SEEK IMMEDIATE MEDICAL CARE IF: °· Your rash continues to spread despite treatment. °· You have a fever, chills, or abdominal pain. °  °This information is not intended to replace advice given to you by your health care provider. Make sure you discuss any questions you have with your health care provider. °  °Document Released: 02/27/2011 Document Revised: 09/03/2011 Document Reviewed: 12/13/2014 °Elsevier Interactive Patient Education ©2016 [employer] ° °

## 2015-04-13 NOTE — MAU Provider Note (Signed)
History   54 yo G0 presented un-announced c/o "bump" on right labia area x 2 days, "want it checked out".  Called office yesterday, given several opportunities for appts, but was unable to schedule.  "Know I can get it checked out here anytime".  Here with caregiver--patient has multiple co-morbidities, including MRSA hx (positive 06/2014), inoperable endometrial cancer, CHR, tracheotomy, HTN, DM, osteomyelitis of several fingers, IUD in place due to dysfunctional bleeding, morbid obsity, multiple allergies, limited mobility-uses walker/wheelchair.  Patient Active Problem List   Diagnosis Date Noted  . Endometrial cancer ([place]) 02/10/2015  . Post-menopausal bleeding 01/18/2015  . Postmenopausal bleeding 01/17/2015  . Adverse drug effect 08/23/2014  . E-coli UTI   . Fever 07/11/2014  . Sepsis ([place]) 07/11/2014  . Vaginal bleeding 06/11/2014  . Hypokalemia 02/08/2014  . Acute blood loss anemia 02/08/2014  . Open fracture of toe 02/06/2014  . Chronic osteomyelitis ([place]) 12/03/2013  . Chronic diastolic heart failure ([place]) 07/29/2013  . Mixed hyperlipidemia 07/29/2013  . Chronic respiratory failure ([place]) 04/22/2013  . Urgency incontinence 08/08/2012  . Lactic acidosis 08/08/2012  . Leukocytosis 08/08/2012  . Hemoptysis 03/27/2012  . Abscess and cellulitis 01/01/2012  . Diabetes mellitus type 2 in obese ([place]) 01/01/2012  . Obesity (BMI 35.0-39.9 without comorbidity) ([place]) 01/01/2012  . OSA (obstructive sleep apnea) 01/01/2012  . HTN (hypertension), benign 01/01/2012  . GERD (gastroesophageal reflux disease) 01/01/2012  . 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 Current use of long term anticoagulation 01/01/2012  . Hx of Clostridium difficile infection 11/16/2011  . Splenomegaly 06/01/2011  . Thrombocytopenia ([place]) 06/01/2011  . ALLERGIC RHINITIS 10/31/2009  . ASTHMA, MILD 10/31/2009  . DYSPHAGIA 10/22/2008  . DIARRHEA 10/22/2008  . FATTY LIVER DISEASE 03/26/2008  . Morbid obesity  ([place]) 08/07/2007  . OBESITY HYPOVENTILATION SYNDROME 08/07/2007    Chief Complaint  Patient presents with  . Groin Swelling   HPI  OB History    Gravida Para Term Preterm AB TAB SAB Ectopic Multiple Living   0         0      Past Medical History  Diagnosis Date  . OSA (obstructive sleep apnea)   . Morbid obesity ([place])   . Depressive disorder, not elsewhere classified   . Diabetes mellitus   . Asthma   . Dyslipidemia   . Clostridium difficile enterocolitis 2012  . Splenomegaly 06/01/2011  . Thrombocytopenia ([place]) 06/01/2011  . Hepatosplenomegaly   . Gout   . GERD (gastroesophageal reflux disease)   . Osteoarthritis   . Polyneuropathy ([place])   . Urinary incontinence   . Amenorrhea   . CHF (congestive heart failure) ([place])   . Hyperlipidemia   . Leg swelling   . Nausea   . Weakness   . Palpitations   . PONV (postoperative nausea and vomiting)   . Anginal pain ([place])     no chest pain in years  . Dysrhythmia     heart palpations  . Anxiety   . Pneumonia   . Shortness of breath   . Chronic kidney disease     overactive bladder  . Peripheral neuropathy ([place])   . DVT (deep venous thrombosis) ([place])   . Endometrial cancer [facility])     Past Surgical History  Procedure Laterality Date  . Cholecystectomy    . Tracheostomy  2001  . Endometrial ablation    . Skin graft    . Finger surgery      ring finger on right hand  . Esophageal  dilation  2003  . Breast biopsy      needle core, left  . Dilation and curettage of uterus      times 2  . Tracheal dilitation  01/24/2012    Procedure: TRACHEAL DILITATION;  Surgeon: Melissa Montane, MD;  Location: [place];  Service: ENT;  Laterality: N/A;  Trache change with possible dilation, from size 6 to size 7 uncuffed  . Amputation Right 11/19/2013    Procedure: AMPUTATION DIGIT;  Surgeon: Cammie Sickle, MD;  Location: [place];  Service: Orthopedics;  Laterality: Right;  Partial amputation right long finger,  Debridement right ring finger   . Amputation Right 06/03/2014    Procedure: REVISION AMPUTATION RIGHT RING FINGER;  Surgeon: Daryll Brod, MD;  Location: [facility];  Service: Orthopedics;  Laterality: Right;  . Dilatation & currettage/hysteroscopy with resectocope N/A 01/17/2015    Procedure: DILATATION & CURETTAGE/HYSTEROSCOPY WITH RESECTOCOPE; Pap Smear;  Surgeon: Ena Dawley, MD;  Location: [facility];  Service: Gynecology;  Laterality: N/A;  . Intrauterine device (iud) insertion N/A 01/17/2015    Procedure: INTRAUTERINE DEVICE (IUD) INSERTION;  Surgeon: Ena Dawley, MD;  Location: [facility];  Service: Gynecology;  Laterality: N/A;    Family History  Problem Relation Age of Onset  . Diabetes Father   . Hypertension Father   . Dementia Father     Deceased, 61  . Pneumonia Father   . Heart disease Mother   . Clotting disorder Mother   . Hypertension Mother   . Heart attack Mother     Deceased, 69  . Multiple myeloma Paternal Grandmother   . Cancer Paternal Grandmother     multiple myoloma  . Heart disease Paternal Grandfather   . Kidney disease Maternal Grandmother   . Hypertension Sister   . Cancer Paternal Aunt     breast  . Cancer Cousin     breast - all three    Social History  Substance Use Topics  . Smoking status: Never Smoker   . Smokeless tobacco: Never Used  . Alcohol Use: No     Comment: occasional - once or twice a year    Allergies:  Allergies  Allergen Reactions  . Codeine Other (See Comments)    Heart problems  . Molds & Smuts Shortness Of Breath and Rash  . Allopurinol Other (See Comments)    GI problems   . Ciprofloxacin Other (See Comments)    GI problems  . Doxycycline Other (See Comments)    Gastric problems  . Fexofenadine Other (See Comments)    Hurts joints/pain  . Keflex [Cephalexin] Diarrhea and Nausea Only  . Nitroglycerin Er Nausea And Vomiting  . Oritavancin Itching    Mild-moderate itching.   . Sulfa Drugs Cross  Reactors Nausea And Vomiting  . Sulfonamide Derivatives Nausea Only  . Accolate [Zafirlukast] Other (See Comments)     rash  . Amoxicillin-Pot Clavulanate Other (See Comments)     GI problems  . Morphine Other (See Comments)    Nausea and irregular HR(flutter)  . Nystatin Rash  . Septra [Sulfamethoxazole-Trimethoprim] Other (See Comments)    unknown    Prescriptions prior to admission  Medication Sig Dispense Refill Last Dose  . acetaminophen (TYLENOL) 500 MG tablet Take 1,000 mg by mouth every 6 (six) hours as needed for mild pain. For pain   Taking  . albuterol (PROVENTIL) (2.5 MG/3ML) 0.083% nebulizer solution Take 2.5 mg by nebulization every 4 (four) hours as needed. For  shortness of breath   Taking  . ALPRAZolam (XANAX) 0.5 MG tablet Take 0.5 mg by mouth 2 (two) times daily as needed. For anxiety   Taking  . atorvastatin (LIPITOR) 10 MG tablet Take 10 mg by mouth at bedtime.    Taking  . B Complex Vitamins (VITAMIN B COMPLEX PO) Take 1 tablet by mouth daily.    Taking  . budesonide (PULMICORT) 0.25 MG/2ML nebulizer solution Take 0.25 mg by nebulization 2 (two) times daily as needed (shortness of breath).   Taking  . bumetanide (BUMEX) 2 MG tablet Take 1 tablet (2 mg total) by mouth daily. May take one extra 8m tab daily as needed for edema. 90 tablet 3 Taking  . busPIRone (BUSPAR) 5 MG tablet Take 7.5 mg by mouth 2 (two) times daily.    Taking  . BYSTOLIC 10 MG tablet TAKE 1 TABLET (10 MG TOTAL) BY MOUTH DAILY. 90 tablet 3 Taking  . cholestyramine (QUESTRAN) 4 G packet Take 1 packet by mouth as needed. For loose stools, IBS.   Taking  . clotrimazole (LOTRIMIN) 1 % cream Apply 1 application topically daily. To stomach   Taking  . colchicine 0.6 MG tablet Take 0.6 mg by mouth daily.   Taking  . DULoxetine (CYMBALTA) 30 MG capsule TAKE 1 CAPSULE (30 MG TOTAL) BY MOUTH 2 (TWO) TIMES DAILY. 180 capsule 3 Taking  . ergocalciferol (VITAMIN D2) 50000 UNITS capsule Take 50,000 Units by  mouth once a week. Take on Fridays   Taking  . Febuxostat (ULORIC) 80 MG TABS Take 80 mg by mouth at bedtime.    Taking  . fesoterodine (TOVIAZ) 4 MG TB24 Take 4 mg by mouth daily.     Taking  . fluticasone (FLONASE) 50 MCG/ACT nasal spray Place 1 spray into both nostrils as needed.   3 Taking  . gabapentin (NEURONTIN) 100 MG capsule TAKE ONE CAPSULE BY MOUTH EVERY MORNING AND 2 CAPS IN THE AFTERNOON AND 2 CAPS AT BEDTIME (Patient taking differently: Take 100 mg by mouth 4 (four) times daily. TAKE ONE CAPSULE BY MOUTH EVERY MORNING AND 1 CAPS AT LUNCH TIME, [facility] AT [facility]) 480 capsule 3 Taking  . guaiFENesin (MUCINEX) 600 MG 12 hr tablet Take 600 mg by mouth 2 (two) times daily.    Taking  . HYDROcodone-acetaminophen (NORCO/VICODIN) 5-325 MG per tablet Take 1 tablet by mouth every 6 (six) hours as needed. for pain  0 Taking  . insulin regular human CONCENTRATED (HUMULIN R) 500 UNIT/ML SOLN injection Inject 40-110 Units into the skin 2 (two) times daily before a meal. Patient injects 110 units am before breakfast and pm 40 units before dinner.   Taking  . ipratropium (ATROVENT) 0.02 % nebulizer solution Take 0.5 mg by nebulization 4 (four) times daily as needed for wheezing or shortness of breath.   Taking  . Liraglutide (VICTOZA) 18 MG/3ML SOLN injection Inject 1.8 mg into the skin daily.   Taking  . losartan (COZAAR) 100 MG tablet TAKE 1 TABLET (100MG) BY MOUTH DAILY. 90 tablet 2 Taking  . meclizine (ANTIVERT) 12.5 MG tablet Take 12.5 mg by mouth at bedtime.   6 Taking  . megestrol (MEGACE) 40 MG tablet Take 2 tablets (80 mg total) by mouth 2 (two) times daily. 120 tablet 3   . metolazone (ZAROXOLYN) 5 MG tablet TAKE 1 TABLET (5 MG TOTAL) BY MOUTH SEE ADMIN INSTRUCTIONS. 3 TIMES WEEKLY AS NEEDED FOR EDEMA 15 tablet 3 Taking  .  Multiple Vitamin (MULTIVITAMIN) capsule Take 1 capsule by mouth daily.     Taking  . Omega-3 Fatty Acids (OMEGA-3 FISH OIL) 1200 MG CAPS Take 2 capsules  (2,400 mg total) by mouth 2 (two) times daily. 120 capsule 6 Taking  . omeprazole (PRILOSEC) 40 MG capsule Take 40 mg by mouth 2 (two) times daily.    Taking  . potassium chloride SA (K-DUR,KLOR-CON) 20 MEQ tablet Take 20 mEq by mouth 4 (four) times daily. 1 in AM, 2 at lunch, 2 at supper and 1 at bedtime   Taking  . primidone (MYSOLINE) 50 MG tablet Take 1 tablet (50 mg total) by mouth 2 (two) times daily. 180 tablet 3 Taking  . Probiotic Product (PROBIOTIC PO) Take 1 tablet by mouth daily.    Taking  . promethazine (PHENERGAN) 25 MG tablet Take 25 mg by mouth every 6 (six) hours as needed for nausea or vomiting.   Taking  . triamcinolone cream (KENALOG) 0.1 % Apply 1 application topically daily as needed. itching  6 Taking  . warfarin (COUMADIN) 5 MG tablet Take 5 mg by mouth daily at 6 PM.    Taking    ROS:  "Bump" on inner thighs/lower stomach, itchy area, peri-anal irritation. Physical Exam   Blood pressure 138/67, pulse 71, temperature 98 F (36.7 C), temperature source Oral, resp. rate 22, last menstrual period 02/11/2015, SpO2 97 %.    Physical Exam  Morbidly obese patient with trach, fingers dressed due to osteomylitis Trach in place Massive panniculus of abdomen and pudenda, with folds of tissue hanging down to knees. 1/2 cm skin tag on panniculus of lower abdomen/pudenda--approx 2 cm area around skin tag appears irritated, c/w chronic yeast Peri-anal area excoriated--appears c/w chronic yeast.   ED Course  Assessment: Morbid obesity Skin tag on labial pannus, with likely yeast  Peri-rectal irritation Multiple co-morbidities--inoperable endometrial cancer, HTN, DM, CHF, asthma, Hx MRSA, limited mobility.  Plan: D/C home [place] hygiene reviewed, but has significant limitations due to morbid obesity and excess skin folds. Rx Ketoconazole 2% for daily application to areas of likely yeast inflammation. (Patient sensitive to Nystatin, no clotrimozole option on her  insurance). Continue to f/u with medical specialists as indicated.   Donnel Saxon CNM, MSN 04/13/2015 10:28 AM

## 2015-04-13 NOTE — MAU Note (Signed)
Patient noticed sore spot on right labia yesterday that looked red and irritated per home health nurse.  No discharge noted.  Pt has hx of MRSA.

## 2015-04-25 ENCOUNTER — Ambulatory Visit (HOSPITAL_COMMUNITY)
Admission: RE | Admit: 2015-04-25 | Discharge: 2015-04-25 | Disposition: A | Discharge status: Home / Self Care | Payer: Medicare Other | Source: Ambulatory Visit | Attending: Gynecologic Oncology | Admitting: Gynecologic Oncology

## 2015-04-25 DIAGNOSIS — D252 Subserosal leiomyoma of uterus: Secondary | ICD-10-CM | POA: Insufficient documentation

## 2015-04-25 DIAGNOSIS — C541 Malignant neoplasm of endometrium: Secondary | ICD-10-CM | POA: Insufficient documentation

## 2015-04-27 ENCOUNTER — Encounter (HOSPITAL_BASED_OUTPATIENT_CLINIC_OR_DEPARTMENT_OTHER): Payer: Medicare Other | Attending: Surgery

## 2015-04-27 DIAGNOSIS — E1151 Type 2 diabetes mellitus with diabetic peripheral angiopathy without gangrene: Secondary | ICD-10-CM | POA: Insufficient documentation

## 2015-04-27 DIAGNOSIS — Z86718 Personal history of other venous thrombosis and embolism: Secondary | ICD-10-CM | POA: Insufficient documentation

## 2015-04-27 DIAGNOSIS — J449 Chronic obstructive pulmonary disease, unspecified: Secondary | ICD-10-CM | POA: Insufficient documentation

## 2015-04-27 DIAGNOSIS — I509 Heart failure, unspecified: Secondary | ICD-10-CM | POA: Insufficient documentation

## 2015-04-27 DIAGNOSIS — I110 Hypertensive heart disease with heart failure: Secondary | ICD-10-CM | POA: Insufficient documentation

## 2015-04-27 DIAGNOSIS — Z6841 Body Mass Index (BMI) 40.0 and over, adult: Secondary | ICD-10-CM | POA: Insufficient documentation

## 2015-04-27 DIAGNOSIS — Z7901 Long term (current) use of anticoagulants: Secondary | ICD-10-CM | POA: Insufficient documentation

## 2015-04-27 DIAGNOSIS — S60159A Contusion of unspecified little finger with damage to nail, initial encounter: Secondary | ICD-10-CM | POA: Insufficient documentation

## 2015-04-27 DIAGNOSIS — S60151A Contusion of right little finger with damage to nail, initial encounter: Secondary | ICD-10-CM | POA: Insufficient documentation

## 2015-04-27 DIAGNOSIS — J45909 Unspecified asthma, uncomplicated: Secondary | ICD-10-CM | POA: Insufficient documentation

## 2015-04-27 DIAGNOSIS — E1142 Type 2 diabetes mellitus with diabetic polyneuropathy: Secondary | ICD-10-CM | POA: Insufficient documentation

## 2015-04-27 DIAGNOSIS — M109 Gout, unspecified: Secondary | ICD-10-CM | POA: Insufficient documentation

## 2015-04-27 DIAGNOSIS — S60511A Abrasion of right hand, initial encounter: Secondary | ICD-10-CM | POA: Insufficient documentation

## 2015-04-27 DIAGNOSIS — S60512A Abrasion of left hand, initial encounter: Secondary | ICD-10-CM | POA: Insufficient documentation

## 2015-04-27 DIAGNOSIS — X58XXXA Exposure to other specified factors, initial encounter: Secondary | ICD-10-CM | POA: Insufficient documentation

## 2015-04-27 DIAGNOSIS — C55 Malignant neoplasm of uterus, part unspecified: Secondary | ICD-10-CM | POA: Insufficient documentation

## 2015-04-27 DIAGNOSIS — Z89021 Acquired absence of right finger(s): Secondary | ICD-10-CM | POA: Insufficient documentation

## 2015-04-27 DIAGNOSIS — L98491 Non-pressure chronic ulcer of skin of other sites limited to breakdown of skin: Secondary | ICD-10-CM | POA: Insufficient documentation

## 2015-04-27 DIAGNOSIS — I890 Lymphedema, not elsewhere classified: Secondary | ICD-10-CM | POA: Insufficient documentation

## 2015-04-27 DIAGNOSIS — S60132A Contusion of left middle finger with damage to nail, initial encounter: Secondary | ICD-10-CM | POA: Insufficient documentation

## 2015-04-27 DIAGNOSIS — Z930 Tracheostomy status: Secondary | ICD-10-CM | POA: Insufficient documentation

## 2015-04-27 DIAGNOSIS — M1990 Unspecified osteoarthritis, unspecified site: Secondary | ICD-10-CM | POA: Insufficient documentation

## 2015-04-27 DIAGNOSIS — G4730 Sleep apnea, unspecified: Secondary | ICD-10-CM | POA: Insufficient documentation

## 2015-04-28 ENCOUNTER — Encounter: Payer: Self-pay | Admitting: Gynecology

## 2015-04-28 ENCOUNTER — Ambulatory Visit: Payer: Medicare Other | Attending: Gynecology | Admitting: Gynecology

## 2015-04-28 VITALS — BP 130/57 | HR 73 | Temp 97.7°F | Resp 18 | Ht 65.0 in | Wt >= 6400 oz

## 2015-04-28 DIAGNOSIS — C541 Malignant neoplasm of endometrium: Secondary | ICD-10-CM

## 2015-04-28 NOTE — Progress Notes (Signed)
Consult Note: Gyn-Onc   [name] 54 y.o. female  No chief complaint on file.   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 The patient is not a surgical candidate secondary to multiple medical problems and morbid obesity. Further, her weight precludes her being treated with radiation therapy here in [place]. The option of having radiation therapy at [facility] and has been offered but the patient does not wish to travel to [place]. Plan: Given the limited treatment options, we will continue our current regimen of oral Megace 80 mg twice a day and the Mirena IUD. We'll repeat an ultrasound to assess endometrial stripe in 3 months. The patient will contact us if she has increasing bleeding.  Interval history: Patient returns today as previously scheduled for follow-up. After our initial visit she had an ultrasound showing an endometrial stripe of 13.4 mm. She was referred to radiation oncology. However, due to her weight, she was unable to be treated with radiation therapy. Socially the patient has been taking Megace 40 mg twice a day and has a Mirena IUD in place. She had a repeat ultrasound to look at the endometrial stripe on 04/25/2015. At that time stripe was 18 mm. There is no other evidence of metastatic disease.  The patient reports that her bleeding has stopped and she has sometimes a thin pink discharge. She denies any pelvic pain pressure or any other gynecologic symptoms.   HPI: 54 year old white female seen in consultation request of [name] regarding management of a recently diagnosed endometrial carcinoma. The patient initially presented with vaginal bleeding. She has a past history of an endometrial ablation in 2008. Given her morbid obesity and overall habitus it was difficult to examine the patient the office. Subsequently the patient underwent a D&C on 01/17/2015.  The uterus sounded 8 cm.Adenocarcinoma was found on curettings that  appeared to be a grade 2. Since the D&C the patient's had minimal spotting. At the time of D&C a Mirena IUD was placed. It is noted that during the D&C the patient's blood pressure fell she required ICU admission.    Noted the patient had a CT scan of the abdomen and pelvis in April that showed no abnormalities and peritoneal cavity or retroperitoneum.   the patient has a multitude of medical problems as well as morbid obesity and is not a surgical candidate.  I initially recommended the patient be treated with a Mirena IUD, Megace, and radiation therapy. However given her excessive weight radiation therapy was not possible (although the patient could potentially be treated in [place]. The patient does not wish to travel to [facility] however.)  Review of Systems:10 point review of systems is negative except as noted in interval history.   Vitals: Blood pressure 130/57, pulse 73, temperature 97.7 F (36.5 C), temperature source Oral, resp. rate 18, height 5' 5"  (1.651 m), weight 413 lb 14.4 oz (187.744 kg), last menstrual period 02/11/2015, SpO2 100 %.  Physical Exam: General : The patient is a healthy woman in no acute distress.  HEENT: normocephalic, extraoccular movements normal; neck is supple without thyromegally       Allergies  Allergen Reactions  . Codeine Other (See Comments)    Heart problems  . Molds & Smuts Shortness Of Breath and Rash  . Allopurinol Other (See Comments)    GI problems   . Ciprofloxacin Other (See Comments)    GI problems  . Doxycycline Other (See Comments)    Gastric problems  .  Fexofenadine Other (See Comments)    Hurts joints/pain  . Keflex [Cephalexin] Diarrhea and Nausea Only  . Nitroglycerin Er Nausea And Vomiting  . Oritavancin Itching    Mild-moderate itching.   . Sulfa Drugs Cross Reactors Nausea And Vomiting  . Sulfonamide Derivatives Nausea Only  . Accolate [Zafirlukast] Other (See Comments)     rash  . Amoxicillin-Pot  Clavulanate Other (See Comments)     GI problems  . Morphine Other (See Comments)    Nausea and irregular HR(flutter)  . Nystatin Rash  . Septra [Sulfamethoxazole-Trimethoprim] Other (See Comments)    unknown    Past Medical History  Diagnosis Date  . OSA (obstructive sleep apnea)   . Morbid obesity ([place])   . Depressive disorder, not elsewhere classified   . Diabetes mellitus   . Asthma   . Dyslipidemia   . Clostridium difficile enterocolitis 2012  . Splenomegaly 06/01/2011  . Thrombocytopenia ([place]) 06/01/2011  . Hepatosplenomegaly   . Gout   . GERD (gastroesophageal reflux disease)   . Osteoarthritis   . Polyneuropathy ([place])   . Urinary incontinence   . Amenorrhea   . CHF (congestive heart failure) ([place])   . Hyperlipidemia   . Leg swelling   . Nausea   . Weakness   . Palpitations   . PONV (postoperative nausea and vomiting)   . Anginal pain ([place])     no chest pain in years  . Dysrhythmia     heart palpations  . Anxiety   . Pneumonia   . Shortness of breath   . Chronic kidney disease     overactive bladder  . Peripheral neuropathy ([place])   . DVT (deep venous thrombosis) ([place])   . Endometrial cancer [facility])     Past Surgical History  Procedure Laterality Date  . Cholecystectomy    . Tracheostomy  2001  . Endometrial ablation    . Skin graft    . Finger surgery      ring finger on right hand  . Esophageal dilation  2003  . Breast biopsy      needle core, left  . Dilation and curettage of uterus      times 2  . Tracheal dilitation  01/24/2012    Procedure: TRACHEAL DILITATION;  Surgeon: Melissa Montane, MD;  Location: [facility];  Service: ENT;  Laterality: N/A;  Trache change with possible dilation, from size 6 to size 7 uncuffed  . Amputation Right 11/19/2013    Procedure: AMPUTATION DIGIT;  Surgeon: Cammie Sickle, MD;  Location: [facility];  Service: Orthopedics;  Laterality: Right;  Partial amputation right long finger, Debridement right ring  finger   . Amputation Right 06/03/2014    Procedure: REVISION AMPUTATION RIGHT RING FINGER;  Surgeon: Daryll Brod, MD;  Location: [facility];  Service: Orthopedics;  Laterality: Right;  . Dilatation & currettage/hysteroscopy with resectocope N/A 01/17/2015    Procedure: DILATATION & CURETTAGE/HYSTEROSCOPY WITH RESECTOCOPE; Pap Smear;  Surgeon: Ena Dawley, MD;  Location: [facility];  Service: Gynecology;  Laterality: N/A;  . Intrauterine device (iud) insertion N/A 01/17/2015    Procedure: INTRAUTERINE DEVICE (IUD) INSERTION;  Surgeon: Ena Dawley, MD;  Location: [facility];  Service: Gynecology;  Laterality: N/A;    Current Outpatient Prescriptions  Medication Sig Dispense Refill  . acetaminophen (TYLENOL) 500 MG tablet Take 1,000 mg by mouth every 6 (six) hours as needed for mild pain. For pain    . albuterol (PROVENTIL) (2.5 MG/3ML) 0.083% nebulizer  solution Take 2.5 mg by nebulization every 4 (four) hours as needed. For shortness of breath    . ALPRAZolam (XANAX) 0.5 MG tablet Take 0.5 mg by mouth 2 (two) times daily as needed. For anxiety    . atorvastatin (LIPITOR) 10 MG tablet Take 10 mg by mouth at bedtime.     . B Complex Vitamins (VITAMIN B COMPLEX PO) Take 1 tablet by mouth daily.     . BD INSULIN SYRINGE ULTRAFINE 31G X 5/16" 0.3 ML MISC USE TO INJECT INSULIN TWICE A DAY SUBCUTANEOUS 30 DAYS  2  . BD PEN NEEDLE NANO U/F 32G X 4 MM MISC USE SUBCUYTANEOUSLY 3 TIMES DAILY  0  . budesonide (PULMICORT) 0.25 MG/2ML nebulizer solution Take 0.25 mg by nebulization 2 (two) times daily as needed (shortness of breath).    . bumetanide (BUMEX) 2 MG tablet Take 1 tablet (2 mg total) by mouth daily. May take one extra 17m tab daily as needed for edema. 90 tablet 3  . busPIRone (BUSPAR) 5 MG tablet Take 7.5 mg by mouth 2 (two) times daily.     .Marland KitchenBYSTOLIC 10 MG tablet TAKE 1 TABLET (10 MG TOTAL) BY MOUTH DAILY. 90 tablet 3  . cholestyramine (QUESTRAN) 4 G packet Take 1 packet by mouth  as needed. For loose stools, IBS.    . clotrimazole (LOTRIMIN) 1 % cream Apply 1 application topically daily. To stomach    . colchicine 0.6 MG tablet Take 0.6 mg by mouth daily.    . DULoxetine (CYMBALTA) 30 MG capsule TAKE 1 CAPSULE (30 MG TOTAL) BY MOUTH 2 (TWO) TIMES DAILY. 180 capsule 3  . Febuxostat (ULORIC) 80 MG TABS Take 80 mg by mouth at bedtime.     . fesoterodine (TOVIAZ) 4 MG TB24 Take 4 mg by mouth daily.      .Marland Kitchengabapentin (NEURONTIN) 100 MG capsule TAKE ONE CAPSULE BY MOUTH EVERY MORNING AND 2 CAPS IN THE AFTERNOON AND 2 CAPS AT BEDTIME (Patient taking differently: Take 100 mg by mouth 4 (four) times daily. TAKE ONE CAPSULE BY MOUTH EVERY MORNING AND 1 CAPS AT LUNCH TIME, [facility]) 480 capsule 3  . guaiFENesin (MUCINEX) 600 MG 12 hr tablet Take 600 mg by mouth 2 (two) times daily.     .Marland KitchenHUMULIN R U-500 KWIKPEN 500 UNIT/ML injection INJECT (110 UNITS) BEFORE BREAKFAST AND (50 UNITS) BEFORE EVENING MEAL SUBCUTANEOUSLY TWICE A DAY  0  . insulin regular human CONCENTRATED (HUMULIN R) 500 UNIT/ML SOLN injection Inject 40-110 Units into the skin 2 (two) times daily before a meal. Patient injects 110 units am before breakfast and pm 40 units before dinner.    . INVOKANA 100 MG TABS tablet Take 100 mg by mouth daily.  99  . ipratropium (ATROVENT) 0.02 % nebulizer solution Take 0.5 mg by nebulization 4 (four) times daily as needed for wheezing or shortness of breath.    . ketoconazole (NIZORAL) 2 % cream Apply 1 application topically daily. 15 g 1  . Liraglutide (VICTOZA) 18 MG/3ML SOLN injection Inject 1.8 mg into the skin daily.    .Marland Kitchenlosartan (COZAAR) 100 MG tablet TAKE 1 TABLET (100MG) BY MOUTH DAILY. 90 tablet 2  . meclizine (ANTIVERT) 12.5 MG tablet Take 12.5 mg by mouth at bedtime.   6  . megestrol (MEGACE) 40 MG tablet Take 2 tablets (80 mg total) by mouth 2 (two) times daily. 120 tablet 3  . metolazone (ZAROXOLYN) 5 MG  tablet TAKE 1 TABLET (5 MG TOTAL) BY MOUTH  SEE ADMIN INSTRUCTIONS. 3 TIMES WEEKLY AS NEEDED FOR EDEMA 15 tablet 3  . Multiple Vitamin (MULTIVITAMIN) capsule Take 1 capsule by mouth daily.      . Omega-3 Fatty Acids (OMEGA-3 FISH OIL) 1200 MG CAPS Take 2 capsules (2,400 mg total) by mouth 2 (two) times daily. 120 capsule 6  . omeprazole (PRILOSEC) 40 MG capsule Take 40 mg by mouth 2 (two) times daily.     [name] oxyCODONE-acetaminophen (PERCOCET/ROXICET) 5-325 MG tablet TAKE 1 TABLET BY MOUTH EVERY 4 HOURS AS NEEDED FOR SEVERE PAIN  0  . potassium chloride SA (K-DUR,KLOR-CON) 20 MEQ tablet Take 20 mEq by mouth 4 (four) times daily. 1 in AM, 2 at lunch, 2 at supper and 1 at bedtime    . primidone (MYSOLINE) 50 MG tablet Take 1 tablet (50 mg total) by mouth 2 (two) times daily. 180 tablet 3  . Probiotic Product (PROBIOTIC PO) Take 1 tablet by mouth daily.     . promethazine (PHENERGAN) 25 MG tablet Take 25 mg by mouth every 6 (six) hours as needed for nausea or vomiting.    [name] VICTOZA 18 MG/3ML SOPN INJECT 1.8MCG ONCE A DAY SUBCUTANEOUS  2  . warfarin (COUMADIN) 5 MG tablet Take 5 mg by mouth daily at 6 PM.     . ergocalciferol (VITAMIN D2) 50000 UNITS capsule Take 50,000 Units by mouth once a week. Take on Fridays    . fluticasone (FLONASE) 50 MCG/ACT nasal spray Place 1 spray into both nostrils as needed.   3  . mupirocin ointment (BACTROBAN) 2 % APPLY 1 APPLICATION TO AFFECTED AREA 3 TIMES DAILY AS NEEDED FOR INFECTION  0  . promethazine-codeine (PHENERGAN WITH CODEINE) 6.25-10 MG/5ML syrup TAKE 1-2 TEASPOONSFUL BY MOUTH EVERY 6 HOURS  0  . triamcinolone cream (KENALOG) 0.1 % Apply 1 application topically daily as needed. itching  6   No current facility-administered medications for this visit.    Social History   Social History  . Marital Status: Single    Spouse Name: N/A  . Number of Children: 0  . Years of Education: N/A   Occupational History  . disabled    Social History Main Topics  . Smoking status: Never Smoker   .  Smokeless tobacco: Never Used  . Alcohol Use: No     Comment: occasional - once or twice a year  . Drug Use: No  . Sexual Activity: Not Currently    Birth Control/ Protection: IUD   Other Topics Concern  . Not on file   Social History Narrative   Lives alone.  She has been on disability since 1999 for morbid obesity.    Single, no children.   She has a caregiver that comes 7-days a week, ~5 hours each day.    Family History  Problem Relation Age of Onset  . Diabetes Father   . Hypertension Father   . Dementia Father     Deceased, 1  . Pneumonia Father   . Heart disease Mother   . Clotting disorder Mother   . Hypertension Mother   . Heart attack Mother     Deceased, 28  . Multiple myeloma Paternal Grandmother   . Cancer Paternal Grandmother     multiple myoloma  . Heart disease Paternal Grandfather   . Kidney disease Maternal Grandmother   . Hypertension Sister   . Cancer Paternal Aunt     breast  . Cancer  Cousin     breast - all three      CLARKE-PEARSON,Heran Campau L, MD 04/28/2015, 12:32 PM

## 2015-04-28 NOTE — Patient Instructions (Signed)
Continue with the current plan of oral megace 80 mg twice daily and the IUD.  We will repeat an ultrasound in three months to evaluate the endometrial thickness.  Call if the bleeding returns.  Please call for any questions or concerns.

## 2015-05-02 ENCOUNTER — Telehealth: Payer: Self-pay

## 2015-05-02 NOTE — Telephone Encounter (Signed)
Orders received from [facility] [facility] to contact the patient with Dr Cl;arke-Pearson's recommendations . Recommendations are to increase the frequency of Megace 80- MG PO BID to 80 MG PO TID. Contacted the patient and updated with MD recommendations , patient states understanding , denies further questions at this time . Patient instructed to call with additional questions , changes or concerns.

## 2015-05-04 DIAGNOSIS — E1142 Type 2 diabetes mellitus with diabetic polyneuropathy: Secondary | ICD-10-CM | POA: Diagnosis not present

## 2015-05-04 DIAGNOSIS — Z930 Tracheostomy status: Secondary | ICD-10-CM | POA: Diagnosis not present

## 2015-05-04 DIAGNOSIS — C55 Malignant neoplasm of uterus, part unspecified: Secondary | ICD-10-CM | POA: Diagnosis not present

## 2015-05-04 DIAGNOSIS — I509 Heart failure, unspecified: Secondary | ICD-10-CM | POA: Diagnosis not present

## 2015-05-04 DIAGNOSIS — J449 Chronic obstructive pulmonary disease, unspecified: Secondary | ICD-10-CM | POA: Diagnosis not present

## 2015-05-04 DIAGNOSIS — M109 Gout, unspecified: Secondary | ICD-10-CM | POA: Diagnosis not present

## 2015-05-04 DIAGNOSIS — M1990 Unspecified osteoarthritis, unspecified site: Secondary | ICD-10-CM | POA: Diagnosis not present

## 2015-05-04 DIAGNOSIS — S60512A Abrasion of left hand, initial encounter: Secondary | ICD-10-CM | POA: Diagnosis not present

## 2015-05-04 DIAGNOSIS — Z89021 Acquired absence of right finger(s): Secondary | ICD-10-CM | POA: Diagnosis not present

## 2015-05-04 DIAGNOSIS — S60511A Abrasion of right hand, initial encounter: Secondary | ICD-10-CM | POA: Diagnosis not present

## 2015-05-04 DIAGNOSIS — G4730 Sleep apnea, unspecified: Secondary | ICD-10-CM | POA: Diagnosis not present

## 2015-05-04 DIAGNOSIS — S60132A Contusion of left middle finger with damage to nail, initial encounter: Secondary | ICD-10-CM | POA: Diagnosis not present

## 2015-05-04 DIAGNOSIS — Z6841 Body Mass Index (BMI) 40.0 and over, adult: Secondary | ICD-10-CM | POA: Diagnosis not present

## 2015-05-04 DIAGNOSIS — S60159A Contusion of unspecified little finger with damage to nail, initial encounter: Secondary | ICD-10-CM | POA: Diagnosis not present

## 2015-05-04 DIAGNOSIS — I890 Lymphedema, not elsewhere classified: Secondary | ICD-10-CM | POA: Diagnosis not present

## 2015-05-04 DIAGNOSIS — J45909 Unspecified asthma, uncomplicated: Secondary | ICD-10-CM | POA: Diagnosis not present

## 2015-05-04 DIAGNOSIS — Z7901 Long term (current) use of anticoagulants: Secondary | ICD-10-CM | POA: Diagnosis not present

## 2015-05-04 DIAGNOSIS — X58XXXA Exposure to other specified factors, initial encounter: Secondary | ICD-10-CM | POA: Diagnosis not present

## 2015-05-04 DIAGNOSIS — E1151 Type 2 diabetes mellitus with diabetic peripheral angiopathy without gangrene: Secondary | ICD-10-CM | POA: Diagnosis not present

## 2015-05-04 DIAGNOSIS — Z86718 Personal history of other venous thrombosis and embolism: Secondary | ICD-10-CM | POA: Diagnosis not present

## 2015-05-04 DIAGNOSIS — I110 Hypertensive heart disease with heart failure: Secondary | ICD-10-CM | POA: Diagnosis not present

## 2015-05-04 DIAGNOSIS — L98491 Non-pressure chronic ulcer of skin of other sites limited to breakdown of skin: Secondary | ICD-10-CM | POA: Diagnosis not present

## 2015-05-04 DIAGNOSIS — S60151A Contusion of right little finger with damage to nail, initial encounter: Secondary | ICD-10-CM | POA: Diagnosis not present

## 2015-05-04 DIAGNOSIS — E11622 Type 2 diabetes mellitus with other skin ulcer: Secondary | ICD-10-CM | POA: Diagnosis present

## 2015-05-06 ENCOUNTER — Other Ambulatory Visit: Payer: Self-pay

## 2015-05-06 DIAGNOSIS — N939 Abnormal uterine and vaginal bleeding, unspecified: Secondary | ICD-10-CM

## 2015-05-06 DIAGNOSIS — C541 Malignant neoplasm of endometrium: Secondary | ICD-10-CM

## 2015-05-06 MED ORDER — MEGESTROL ACETATE 40 MG PO TABS: 80.0000 mg | ORAL_TABLET | Freq: Three times a day (TID) | ORAL | Status: DC

## 2015-05-06 NOTE — Progress Notes (Signed)
Patient called for refill of Megace

## 2015-05-11 ENCOUNTER — Other Ambulatory Visit: Payer: Self-pay | Admitting: Neurology

## 2015-05-11 NOTE — Telephone Encounter (Signed)
Rx sent 

## 2015-05-20 ENCOUNTER — Telehealth: Payer: Self-pay

## 2015-05-20 DIAGNOSIS — C541 Malignant neoplasm of endometrium: Secondary | ICD-10-CM

## 2015-05-20 NOTE — Telephone Encounter (Signed)
Multiple calls received today from the patient requesting to update Dr Fermin Schwab of new onset of vaginal bleeding that started 1.5 days ago. Patient states she is changing her pad 4-5 times yesterday and 1 once today . Patient states the amount of blood noted in her pad is about "1/2" of pad ,denies cramping. Patient is currently taking Megace 80 MG PO TID , will update Melissa Cross , APNP . Dr Fermin Schwab and Joylene John , APNP updated. Orders received to have the patient come in for Hgb/Hct today , patient states she can not come today as she does not have transportation today ,but will on Monday Nov 28,2016. Appointment scheduled for Monday 05/23/2015 at 10 AM , patient states understanding and is aware if the bleeding increases she should seek medical attention. Patient denies further questions at this time , will call with additional changes or concerns.

## 2015-05-23 ENCOUNTER — Other Ambulatory Visit (HOSPITAL_BASED_OUTPATIENT_CLINIC_OR_DEPARTMENT_OTHER): Payer: Medicare Other

## 2015-05-23 ENCOUNTER — Other Ambulatory Visit: Payer: Self-pay | Admitting: Gynecologic Oncology

## 2015-05-23 DIAGNOSIS — N950 Postmenopausal bleeding: Secondary | ICD-10-CM

## 2015-05-23 DIAGNOSIS — N939 Abnormal uterine and vaginal bleeding, unspecified: Secondary | ICD-10-CM | POA: Diagnosis not present

## 2015-05-23 DIAGNOSIS — C541 Malignant neoplasm of endometrium: Secondary | ICD-10-CM | POA: Diagnosis not present

## 2015-05-23 LAB — CBC WITH DIFFERENTIAL/PLATELET
BASO%: 0.5 % (ref 0.0–2.0)
BASOS ABS: 0 10*3/uL (ref 0.0–0.1)
EOS ABS: 0.1 10*3/uL (ref 0.0–0.5)
EOS%: 1.5 % (ref 0.0–7.0)
HCT: 43.4 % (ref 34.8–46.6)
HEMOGLOBIN: 14.2 g/dL (ref 11.6–15.9)
LYMPH%: 26.4 % (ref 14.0–49.7)
MCH: 29.5 pg (ref 25.1–34.0)
MCHC: 32.7 g/dL (ref 31.5–36.0)
MCV: 90.2 fL (ref 79.5–101.0)
MONO#: 1 10*3/uL — ABNORMAL HIGH (ref 0.1–0.9)
MONO%: 11.1 % (ref 0.0–14.0)
NEUT#: 5.5 10*3/uL (ref 1.5–6.5)
NEUT%: 60.5 % (ref 38.4–76.8)
Platelets: 157 10*3/uL (ref 145–400)
RBC: 4.81 10*6/uL (ref 3.70–5.45)
RDW: 14.7 % — AB (ref 11.2–14.5)
WBC: 9 10*3/uL (ref 3.9–10.3)
lymph#: 2.4 10*3/uL (ref 0.9–3.3)

## 2015-05-23 LAB — PROTIME-INR
INR: 2.8 (ref 2.00–3.50)
PROTIME: 33.6 s — AB (ref 10.6–13.4)

## 2015-05-24 ENCOUNTER — Telehealth: Payer: Self-pay | Admitting: Gynecologic Oncology

## 2015-05-24 NOTE — Telephone Encounter (Signed)
Called to follow up with patient about vaginal bleeding.  Discussed lab results as well.  Stating she is having spotting at this time.  "Last week was horrible but it is getting better."  She also states she recently had an abscess lanced on her abdomen so she is healing from that.  Advised of Dr. Fermin Schwab recommendations for radiation if the bleeding continues and not increasing the dose of her Megace at this time.  He also wanted her to follow up with her physician that prescribes her Coumadin.  Patient stating she has never had a DVT and she was placed on Coumadin a long time ago after she had her trach placed.  Patient also stating she currently weighs 414 from 28 and she would like to see if she could have radiation now with Dr. Pablo Ledger.

## 2015-05-24 NOTE — Telephone Encounter (Signed)
Called and spoke with the patient this am about vaginal bleeding symptoms.  Reporting very little discharge this am.  Stating she would like to go ahead and meet with the Radiation Oncologist about potential treatment for endo ca.  I spoke with Dr. Sondra Come this am and since the patient's weight has dropped from 422 to 414(per pt), she could possibly be a candidate for radiation at this time.  The patient stated she would like to see Dr. Pablo Ledger since her friend recommended her.  Patient also wanting a message sent to Dr. Delene Ruffini office about her coumadin use since she has never had a DVT.  "I was just placed on it after my trach."  Advised she would be contacted with an appt with meet with the Radiation Oncologist.

## 2015-05-30 NOTE — Progress Notes (Signed)
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  Biopsies revealed:   01/17/15 Diagnosis 1. Endocervix, curettage - ADENOCARCINOMA. - SEE DESCRIPTION. 2. Endometrium, curettage - ADENOCARCINOMA. - SEE DESCRIPTION  Past/Anticipated interventions by Gyn/Onc surgery, if any: Procedure: DILATATION & CURETTAGE/HYSTEROSCOPY WITH RESECTOCOPE; Pap Smear;  Surgeon: Ena Dawley, MD;  Location: [facility];  Service: Gynecology;  Laterality: N/A; Procedure: INTRAUTERINE DEVICE (IUD) INSERTION;  Surgeon: Ena Dawley, MD;  Location: [facility];  Service: Gynecology;  Laterality: N/A;   Past/Anticipated interventions by medical oncology, if any:  Per GYN ONC on 04/28/15: Given the limited treatment options, we will continue our current regimen of oral Megace 80 mg three times daily and the Mirena IUD. We'll repeat an ultrasound to assess endometrial stripe in 3 months. The patient will contact us if she has any problems  Weight changes, if any:  Wt Readings from Last 3 Encounters:  06/01/15 412 lb 11.2 oz (187.199 kg)  04/28/15 413 lb 14.4 oz (187.744 kg)  02/10/15 426 lb 3.2 oz (193.323 kg)    Bowel/Bladder complaints, if any: Yes.  , urinary frequency with dribbling and now has bright red blood from her vagina and she needs to change her peri-pad every 2-3 hours. She reports it will be bright red then get darker during the day.   Nausea/Vomiting, if any: Reports nausea occasionally  Pain issues, if any:  She denies any pain at this time.       SAFETY ISSUES:  Prior radiation? No  Pacemaker/ICD? NO  Possible current pregnancy? no  Is the patient on methotrexate? No  Current Complaints / other details: None She has recently lost weight Travel by wheelchair.  Difficulty standing.  Accompanied by caregiver.  BP 148/68 mmHg  Pulse 71  Temp(Src) 98.7 F (37.1 C)  Wt 412 lb 11.2 oz (187.199 kg)

## 2015-05-31 ENCOUNTER — Telehealth: Payer: Self-pay | Admitting: Gynecologic Oncology

## 2015-05-31 NOTE — Telephone Encounter (Signed)
Patient called stating she is having vaginal bleeding.  It started as dark brown and is currently bright red.  She has to change her peri-pad every 2 -3 hours.  Advised to monitor and call the office if it worsens.  Also advised to keep her appt with Dr. Isidore Moos tomorrow.  Reportable signs and symptoms reviewed.  Advised to call for any questions or concerns.

## 2015-06-01 ENCOUNTER — Ambulatory Visit
Admission: RE | Admit: 2015-06-01 | Discharge: 2015-06-01 | Disposition: A | Discharge status: Home / Self Care | Payer: Medicare Other | Source: Ambulatory Visit | Attending: Radiation Oncology | Admitting: Radiation Oncology

## 2015-06-01 ENCOUNTER — Encounter: Payer: Self-pay | Admitting: Radiation Oncology

## 2015-06-01 VITALS — BP 148/68 | HR 71 | Temp 98.7°F | Wt >= 6400 oz

## 2015-06-01 DIAGNOSIS — N189 Chronic kidney disease, unspecified: Secondary | ICD-10-CM | POA: Diagnosis not present

## 2015-06-01 DIAGNOSIS — Z510 Encounter for antineoplastic radiation therapy: Secondary | ICD-10-CM | POA: Insufficient documentation

## 2015-06-01 DIAGNOSIS — I509 Heart failure, unspecified: Secondary | ICD-10-CM | POA: Diagnosis not present

## 2015-06-01 DIAGNOSIS — C541 Malignant neoplasm of endometrium: Secondary | ICD-10-CM | POA: Insufficient documentation

## 2015-06-01 DIAGNOSIS — E785 Hyperlipidemia, unspecified: Secondary | ICD-10-CM | POA: Insufficient documentation

## 2015-06-01 DIAGNOSIS — E119 Type 2 diabetes mellitus without complications: Secondary | ICD-10-CM | POA: Insufficient documentation

## 2015-06-01 DIAGNOSIS — G4733 Obstructive sleep apnea (adult) (pediatric): Secondary | ICD-10-CM | POA: Diagnosis not present

## 2015-06-01 DIAGNOSIS — J45909 Unspecified asthma, uncomplicated: Secondary | ICD-10-CM | POA: Diagnosis not present

## 2015-06-01 DIAGNOSIS — F329 Major depressive disorder, single episode, unspecified: Secondary | ICD-10-CM | POA: Diagnosis not present

## 2015-06-01 LAB — COMPREHENSIVE METABOLIC PANEL
ALBUMIN: 3.5 g/dL (ref 3.5–5.0)
ALK PHOS: 66 U/L (ref 40–150)
ALT: 17 U/L (ref 0–55)
AST: 16 U/L (ref 5–34)
Anion Gap: 11 mEq/L (ref 3–11)
BILIRUBIN TOTAL: 0.57 mg/dL (ref 0.20–1.20)
BUN: 14.8 mg/dL (ref 7.0–26.0)
CALCIUM: 9.4 mg/dL (ref 8.4–10.4)
CO2: 21 mEq/L — ABNORMAL LOW (ref 22–29)
Chloride: 103 mEq/L (ref 98–109)
Creatinine: 1.2 mg/dL — ABNORMAL HIGH (ref 0.6–1.1)
EGFR: 53 mL/min/{1.73_m2} — AB (ref >90)
GLUCOSE: 291 mg/dL — AB (ref 70–140)
POTASSIUM: 4.4 meq/L (ref 3.5–5.1)
SODIUM: 135 meq/L — AB (ref 136–145)
TOTAL PROTEIN: 8.1 g/dL (ref 6.4–8.3)

## 2015-06-01 LAB — CBC WITH DIFFERENTIAL/PLATELET
BASO%: 0.8 % (ref 0.0–2.0)
Basophils Absolute: 0.1 10*3/uL (ref 0.0–0.1)
EOS%: 1.9 % (ref 0.0–7.0)
Eosinophils Absolute: 0.2 10*3/uL (ref 0.0–0.5)
HCT: 41.6 % (ref 34.8–46.6)
HGB: 13.6 g/dL (ref 11.6–15.9)
LYMPH%: 29.5 % (ref 14.0–49.7)
MCH: 29.7 pg (ref 25.1–34.0)
MCHC: 32.8 g/dL (ref 31.5–36.0)
MCV: 90.4 fL (ref 79.5–101.0)
MONO#: 1.1 10*3/uL — ABNORMAL HIGH (ref 0.1–0.9)
MONO%: 13.1 % (ref 0.0–14.0)
NEUT%: 54.7 % (ref 38.4–76.8)
NEUTROS ABS: 4.5 10*3/uL (ref 1.5–6.5)
Platelets: 158 10*3/uL (ref 145–400)
RBC: 4.6 10*6/uL (ref 3.70–5.45)
RDW: 15 % — ABNORMAL HIGH (ref 11.2–14.5)
WBC: 8.1 10*3/uL (ref 3.9–10.3)
lymph#: 2.4 10*3/uL (ref 0.9–3.3)

## 2015-06-01 MED ORDER — SODIUM CHLORIDE 0.9 % IJ SOLN
3.0000 mL | Freq: Once | INTRAMUSCULAR | Status: AC
  Administered 2015-06-01: 3 mL via INTRAVENOUS

## 2015-06-01 MED ORDER — OXYCODONE-ACETAMINOPHEN 5-325 MG PO TABS: ORAL_TABLET | ORAL | Status: DC

## 2015-06-01 NOTE — Progress Notes (Signed)
Simulation complete. Removed left AC 22 gauge IV. Catheter intact upon removal. Applied an occlusive dressing to old IV site. Patient tolerated well.

## 2015-06-01 NOTE — Progress Notes (Signed)
Radiation Oncology         (336) [phone] ________________________________    outpatient Re-Consultation  Name: NAVYA TIMMONS MRN: [id number]  Date: 06/01/2015  DOB: May 25, 1961  TD:VVOHYWV,PXTGGY COLLINS, MD  Marti Sleigh   REFERRING PHYSICIAN: Marti Sleigh  DIAGNOSIS: Inoperable endometrial cancer.  Cannot determine stage with certainty.     ICD-9-CM ICD-10-CM   1. Endometrial cancer (HCC) 182.0 C54.1 Comprehensive metabolic panel     CBC with Differential     oxyCODONE-acetaminophen (PERCOCET/ROXICET) 5-325 MG tablet    HISTORY OF PRESENT ILLNESS::Rejoice B Avery is a 54 y.o. female who presents for re-consultation.  She was seen previously in our clinic by Dr. Sondra Come for consultation in August.  Per his consult note,  "The patient presented earlier this year with vaginal bleeding. She was seen by Dr. Raphael Gibney and proceeded to undergo D and C on 01/17/2015. The uterus sounded to 8 cm. Adenocarcinoma was found in curettings that appeared to be grade 2. Patient did have a Mirena IUD placed at the time of her D&C. At the time of the patient's D&C her blood pressure fell significantly requiring ICU admission. Patient does have a past history of endometrial ablation back in 2008. In light of the new diagnosis of endometrial cancer the patient was referred to Dr. Fermin Schwab for evaluation. She was deemed not to be a surgical candidate in light of her medical issues."  Dr Sondra Come then attempted to simulate and plan pelvic radiation for Ms. Herda, but her weight was prohibitive to successfully do so on our machines.  She was advised to lose weight with the help of a nutritionist.  She is now 412 pounds and able to be simulated for treatment.  She re-presents to clinic because of increased vaginal bleeding.  Now, she had bright red blood from her vagina frequently and needs to change her pad every 2-3 hrs.  The blood gets darker as the day goes on.  She had done relatively  well in the past few months with her bleeding controlled by an IUD and Megace, but those interventions are no longer working.    Last CT imaging was 02-22-15 - CT neck, chest, ab and pelvis. I have reviewed these scans. No obvious metastatic disease, but a 14 mm right common iliac node was appreciated and possibly neoplastic.   She reports she has had a trach for about 15 years to address sleep apnea.  PREVIOUS RADIATION THERAPY: No  PAST MEDICAL HISTORY:  has a past medical history of OSA (obstructive sleep apnea); Morbid obesity ([place]); Depressive disorder, not elsewhere classified; Diabetes mellitus; Asthma; Dyslipidemia; Clostridium difficile enterocolitis (2012); Splenomegaly (06/01/2011); Thrombocytopenia ([place]) (06/01/2011); Hepatosplenomegaly; Gout; GERD (gastroesophageal reflux disease); Osteoarthritis; Polyneuropathy ([place]); Urinary incontinence; Amenorrhea; CHF (congestive heart failure) ([place]); Hyperlipidemia; Leg swelling; Nausea; Weakness; Palpitations; PONV (postoperative nausea and vomiting); Anginal pain ([place]); Dysrhythmia; Anxiety; Pneumonia; Shortness of breath; Chronic kidney disease; Peripheral neuropathy ([place]); DVT (deep venous thrombosis) ([place]); and Endometrial cancer ([place]).    PAST SURGICAL HISTORY: Past Surgical History  Procedure Laterality Date  . Cholecystectomy    . Tracheostomy  2001  . Endometrial ablation    . Skin graft    . Finger surgery      ring finger on right hand  . Esophageal dilation  2003  . Breast biopsy      needle core, left  . Dilation and curettage of uterus      times 2  . Tracheal dilitation  01/24/2012    Procedure:  TRACHEAL DILITATION;  Surgeon: Melissa Montane, MD;  Location: [facility] [place] [facility] [place];  Service: ENT;  Laterality: N/A;  Trache change with possible dilation, from size 6 to size 7 uncuffed  . Amputation Right 11/19/2013    Procedure: AMPUTATION DIGIT;  Surgeon: Cammie Sickle, MD;  Location: [facility];  Service: Orthopedics;  Laterality:  Right;  Partial amputation right long finger, Debridement right ring finger   . Amputation Right 06/03/2014    Procedure: REVISION AMPUTATION RIGHT RING FINGER;  Surgeon: Daryll Brod, MD;  Location: [facility];  Service: Orthopedics;  Laterality: Right;  . Dilatation & currettage/hysteroscopy with resectocope N/A 01/17/2015    Procedure: DILATATION & CURETTAGE/HYSTEROSCOPY WITH RESECTOCOPE; Pap Smear;  Surgeon: Ena Dawley, MD;  Location: [facility];  Service: Gynecology;  Laterality: N/A;  . Intrauterine device (iud) insertion N/A 01/17/2015    Procedure: INTRAUTERINE DEVICE (IUD) INSERTION;  Surgeon: Ena Dawley, MD;  Location: [facility];  Service: Gynecology;  Laterality: N/A;    FAMILY HISTORY: family history includes Cancer in her cousin, paternal aunt, and paternal grandmother; Clotting disorder in her mother; Dementia in her father; Diabetes in her father; Heart attack in her mother; Heart disease in her mother and paternal grandfather; Hypertension in her father, mother, and sister; Kidney disease in her maternal grandmother; Multiple myeloma in her paternal grandmother; Pneumonia in her father.  SOCIAL HISTORY:  reports that she has never smoked. She has never used smokeless tobacco. She reports that she does not drink alcohol or use illicit drugs.  ALLERGIES: Codeine; Molds & smuts; Allopurinol; Ciprofloxacin; Doxycycline; Fexofenadine; Keflex; Nitroglycerin er; Oritavancin; Sulfa drugs cross reactors; Sulfonamide derivatives; Accolate; Amoxicillin-pot clavulanate; Morphine; Nystatin; and Septra  MEDICATIONS:  Current Outpatient Prescriptions  Medication Sig Dispense Refill  . acetaminophen (TYLENOL) 500 MG tablet Take 1,000 mg by mouth every 6 (six) hours as needed for mild pain. For pain    . albuterol (PROVENTIL) (2.5 MG/3ML) 0.083% nebulizer solution Take 2.5 mg by nebulization every 4 (four) hours as needed. For shortness of breath    . ALPRAZolam (XANAX) 0.5 MG  tablet Take 0.5 mg by mouth 2 (two) times daily as needed. For anxiety    . atorvastatin (LIPITOR) 10 MG tablet Take 10 mg by mouth at bedtime.     . B Complex Vitamins (VITAMIN B COMPLEX PO) Take 1 tablet by mouth daily.     . BD INSULIN SYRINGE ULTRAFINE 31G X 5/16" 0.3 ML MISC USE TO INJECT INSULIN TWICE A DAY SUBCUTANEOUS 30 DAYS  2  . BD PEN NEEDLE NANO U/F 32G X 4 MM MISC USE SUBCUYTANEOUSLY 3 TIMES DAILY  0  . budesonide (PULMICORT) 0.25 MG/2ML nebulizer solution Take 0.25 mg by nebulization 2 (two) times daily as needed (shortness of breath).    . bumetanide (BUMEX) 2 MG tablet Take 1 tablet (2 mg total) by mouth daily. May take one extra 36m tab daily as needed for edema. 90 tablet 3  . busPIRone (BUSPAR) 5 MG tablet Take 7.5 mg by mouth 2 (two) times daily.     .Marland KitchenBYSTOLIC 10 MG tablet TAKE 1 TABLET (10 MG TOTAL) BY MOUTH DAILY. 90 tablet 3  . cholestyramine (QUESTRAN) 4 G packet Take 1 packet by mouth as needed. For loose stools, IBS.    . clotrimazole (LOTRIMIN) 1 % cream Apply 1 application topically daily. To stomach    . colchicine 0.6 MG tablet Take 0.6 mg by mouth daily.    . DULoxetine (CYMBALTA) 30 MG  capsule TAKE 1 CAPSULE (30 MG TOTAL) BY MOUTH 2 (TWO) TIMES DAILY. 180 capsule 3  . ergocalciferol (VITAMIN D2) 50000 UNITS capsule Take 50,000 Units by mouth once a week. Take on Fridays    . Febuxostat (ULORIC) 80 MG TABS Take 80 mg by mouth at bedtime.     . fesoterodine (TOVIAZ) 4 MG TB24 Take 4 mg by mouth daily.      [name] gabapentin (NEURONTIN) 100 MG capsule TAKE ONE CAPSULE BY MOUTH EVERY MORNING AND 2 CAPS IN THE AFTERNOON AND 2 CAPS AT BEDTIME (Patient taking differently: Take 100 mg by mouth 4 (four) times daily. TAKE ONE CAPSULE BY MOUTH EVERY MORNING AND 1 CAPS AT LUNCH TIME, 1 AT [facility] AND 2 CAPS AT BEDTIME) 480 capsule 3  . guaiFENesin (MUCINEX) 600 MG 12 hr tablet Take 600 mg by mouth 2 (two) times daily.     [name] HUMULIN R U-500 KWIKPEN 500 UNIT/ML injection INJECT (110  UNITS) BEFORE BREAKFAST AND (50 UNITS) BEFORE EVENING MEAL SUBCUTANEOUSLY TWICE A DAY  0  . insulin regular human CONCENTRATED (HUMULIN R) 500 UNIT/ML SOLN injection Inject 40-110 Units into the skin 2 (two) times daily before a meal. Patient injects 110 units am before breakfast and pm 40 units before dinner.    . INVOKANA 100 MG TABS tablet Take 100 mg by mouth daily.  99  . ipratropium (ATROVENT) 0.02 % nebulizer solution Take 0.5 mg by nebulization 4 (four) times daily as needed for wheezing or shortness of breath.    . ketoconazole (NIZORAL) 2 % cream Apply 1 application topically daily. 15 g 1  . losartan (COZAAR) 100 MG tablet TAKE 1 TABLET (100MG) BY MOUTH DAILY. 90 tablet 2  . meclizine (ANTIVERT) 12.5 MG tablet Take 12.5 mg by mouth at bedtime.   6  . megestrol (MEGACE) 40 MG tablet Take 2 tablets (80 mg total) by mouth 3 (three) times daily. 180 tablet 3  . metolazone (ZAROXOLYN) 5 MG tablet TAKE 1 TABLET (5 MG TOTAL) BY MOUTH SEE ADMIN INSTRUCTIONS. 3 TIMES WEEKLY AS NEEDED FOR EDEMA 15 tablet 3  . Multiple Vitamin (MULTIVITAMIN) capsule Take 1 capsule by mouth daily.      . Omega-3 Fatty Acids (OMEGA-3 FISH OIL) 1200 MG CAPS Take 2 capsules (2,400 mg total) by mouth 2 (two) times daily. 120 capsule 6  . omeprazole (PRILOSEC) 40 MG capsule Take 40 mg by mouth 2 (two) times daily.     [name] oxyCODONE-acetaminophen (PERCOCET/ROXICET) 5-325 MG tablet Take 1 tablet 30 minutes before radiation treatments or scans. 30 tablet 0  . potassium chloride SA (K-DUR,KLOR-CON) 20 MEQ tablet Take 20 mEq by mouth 4 (four) times daily. 1 in AM, 2 at lunch, 2 at supper and 1 at bedtime    . primidone (MYSOLINE) 50 MG tablet Take 1 tablet (50 mg total) by mouth 2 (two) times daily. 180 tablet 3  . Probiotic Product (PROBIOTIC PO) Take 1 tablet by mouth daily.     . promethazine (PHENERGAN) 25 MG tablet Take 25 mg by mouth every 6 (six) hours as needed for nausea or vomiting.    . promethazine-codeine (PHENERGAN  WITH CODEINE) 6.25-10 MG/5ML syrup TAKE 1-2 TEASPOONSFUL BY MOUTH EVERY 6 HOURS  0  . triamcinolone cream (KENALOG) 0.1 % Apply 1 application topically daily as needed. itching  6  . warfarin (COUMADIN) 5 MG tablet Take 5 mg by mouth daily at 6 PM.     . fluticasone (FLONASE) 50 MCG/ACT nasal spray  Place 1 spray into both nostrils as needed.   3  . Liraglutide (VICTOZA) 18 MG/3ML SOLN injection Inject 1.8 mg into the skin daily.    . mupirocin ointment (BACTROBAN) 2 % APPLY 1 APPLICATION TO AFFECTED AREA 3 TIMES DAILY AS NEEDED FOR INFECTION  0  . VICTOZA 18 MG/3ML SOPN INJECT 1.8MCG ONCE A DAY SUBCUTANEOUS  2   No current facility-administered medications for this encounter.    REVIEW OF SYSTEMS:  Notable for that above.   PHYSICAL EXAM:  weight is 412 lb 11.2 oz (187.199 kg). Her temperature is 98.7 F (37.1 C). Her blood pressure is 148/68 and her pulse is 71.   General: Alert and oriented, in no acute distress HEENT: Head is normocephalic. Extraocular movements are intact. Oropharynx is clear. Neck: Trach/collar in place. Neck is  Without obvious palpable cervical or supraclavicular lymphadenopathy. Heart: Regular in rate and rhythm  Chest: Clear to auscultation bilaterally, with no rhonchi, wheezes, or rales. Abdomen: Obese, Soft, nontender , with no rigidity or guarding. Extremities: LE edema, signs of chronic venous stasis Lymphatics: see Neck Exam; no groin adenopathy appreciated  Skin:  Erythema of perineum, abdominal panus, and lower extremities. Musculoskeletal: need help getting out of WC onto table Neurologic: Cranial nerves II through XII are grossly intact. No obvious focalities. Speech is fluent.  [name] Psychiatric: Judgment and insight are intact. Affect is appropriate.    ECOG = 4  0 - Asymptomatic (Fully active, able to carry on all predisease activities without restriction)  1 - Symptomatic but completely ambulatory (Restricted in physically strenuous activity but  ambulatory and able to carry out work of a light or sedentary nature. For example, light housework, office work)  2 - Symptomatic, <50% in bed during the day (Ambulatory and capable of all self care but unable to carry out any work activities. Up and about more than 50% of waking hours)  3 - Symptomatic, >50% in bed, but not bedbound (Capable of only limited self-care, confined to bed or chair 50% or more of waking hours)  4 - Bedbound (Completely disabled. Cannot carry on any self-care. Totally confined to bed or chair)  5 - Death   Eustace Pen MM, Creech RH, Tormey DC, et al. [phone]). "Toxicity and response criteria of the [facility]". [place] Oncol. 5 (6): 649-55   LABORATORY DATA:  Lab Results  Component Value Date   WBC 8.1 06/01/2015   HGB 13.6 06/01/2015   HCT 41.6 06/01/2015   MCV 90.4 06/01/2015   PLT 158 06/01/2015   CMP     Component Value Date/Time   NA 135* 06/01/2015 1128   NA 134* 01/17/2015 1515   NA 137 02/14/2010 1446   K 4.4 06/01/2015 1128   K 4.0 01/17/2015 1515   K 4.1 02/14/2010 1446   CL 101 01/17/2015 1515   CL 96* 02/14/2010 1446   CO2 21* 06/01/2015 1128   CO2 27 01/17/2015 1515   CO2 26 02/14/2010 1446   GLUCOSE 291* 06/01/2015 1128   GLUCOSE 274* 01/17/2015 1515   GLUCOSE 171* 02/14/2010 1446   BUN 14.8 06/01/2015 1128   BUN 11 01/17/2015 1515   BUN 29* 02/14/2010 1446   CREATININE 1.2* 06/01/2015 1128   CREATININE 1.10* 01/17/2015 1515   CREATININE 1.0 02/14/2010 1446   CALCIUM 9.4 06/01/2015 1128   CALCIUM 8.0* 01/17/2015 1515   CALCIUM 9.0 02/14/2010 1446   PROT 8.1 06/01/2015 1128   PROT 7.0 01/17/2015 1515  PROT 7.7 02/14/2010 1446   ALBUMIN 3.5 06/01/2015 1128   ALBUMIN 3.4* 01/17/2015 1515   ALBUMIN 3.7 02/14/2010 1446   AST 16 06/01/2015 1128   AST 26 01/17/2015 1515   AST 39* 02/14/2010 1446   ALT 17 06/01/2015 1128   ALT 25 01/17/2015 1515   ALT 44 02/14/2010 1446   ALKPHOS 66 06/01/2015 1128    ALKPHOS 45 01/17/2015 1515   ALKPHOS 42 02/14/2010 1446   BILITOT 0.57 06/01/2015 1128   BILITOT 0.7 01/17/2015 1515   BILITOT 0.40 02/14/2010 1446   GFRNONAA 57* 01/17/2015 1515   GFRAA >60 01/17/2015 1515         RADIOGRAPHY: No results found. AS ABOVE    IMPRESSION/PLAN: Today, I talked to the patient about the findings and work-up thus far. We discussed the patient's diagnosis of inoperable endometrial cancer, progressive vaginal bleeding, and general treatment for this, highlighting the role of radiotherapy in the management. We discussed the available radiation techniques, and focused on the details of logistics and delivery.    We discussed the risks, benefits, and side effects of radiotherapy. Side effects may include but not necessarily be limited to: skin irritation, fatigue, bowel or bladder injury, nausea, diarrhea.  No guarantees of treatment were given. A consent form was signed and placed in the patient's medical record.   The patient was encouraged to ask questions that I answered to the best of my ability. She is enthusiastic to proceed and very appreciative of this consult.  I will simulate and start treatment planning today, and aim to expedite treatment to start in 5 days.  Ordering baseline labs today.  (reviewed, see results above)  I will try to get in touch with oncologists at [facility] and/or [facility] to see if brachytherapy boost is an option after patient receivew 45-50.4Gy of EBRT to the pelvis, to maximize local control.  Patient and caretaker are amenable to driving to a university for a consultation.   __________________________________________   Eppie Gibson, MD

## 2015-06-01 NOTE — Progress Notes (Addendum)
  Radiation Oncology         (336) [phone] ________________________________  Name: [name] MRN: [id number]  Date: 06/01/2015  DOB: [birth year]  SIMULATION AND TREATMENT PLANNING NOTE  Outpatient  DIAGNOSIS:     ICD-9-CM ICD-10-CM   1. Endometrial cancer (HCC) 182.0 C54.1 sodium chloride 0.9 % injection 3 mL    NARRATIVE:  The patient was brought to the [place] [facility].  Identity was confirmed.  All relevant records and images related to the planned course of therapy were reviewed.  The patient freely provided informed written consent to proceed with treatment after reviewing the details related to the planned course of therapy. The consent form was witnessed and verified by the simulation staff.    Then, the patient was set-up in a stable reproducible  supine position for radiation therapy.  Vaclock was custom made for lower body immobilization.  Wiring placed at introitus.  Vaginal swab was attempted to be placed in vaginal, but due to body habitus and difficulty visualizing perineum, this was later realized to have been placed inside rectum. CT images were obtained.  Surface markings were placed.  The CT images were loaded into the planning software.   Her entire girth was not included in the CT axial images due to body habitus/morbidy obesity, therefore lateral fields cannot be used and 3DCRT therapy is not an ideal option.*  TREATMENT PLANNING NOTE: Treatment planning then occurred.  The radiation prescription was entered and confirmed.    A total of 7 medically necessary complex treatment devices were fabricated and supervised by me, in the form of vaclock and 6 fields with MLCs to block bowel, femoral heads, bladder, skin.   I have requested : 3D conformal RT is medically necessary for this case for the following reason:   bowel sparing and bladder and femoral head sparing.   Dose calculation ordered. The patient will receive 45 Gy in 25 fractions to the uterus, vagina,  and pelvic nodes. Possible boost to follow.   -----------------------------------  Eppie Gibson, MD

## 2015-06-03 ENCOUNTER — Ambulatory Visit (HOSPITAL_COMMUNITY)
Admission: RE | Admit: 2015-06-03 | Discharge: 2015-06-03 | Disposition: A | Discharge status: Home / Self Care | Payer: Medicare Other | Source: Ambulatory Visit | Attending: Orthopedic Surgery | Admitting: Orthopedic Surgery

## 2015-06-03 DIAGNOSIS — B999 Unspecified infectious disease: Secondary | ICD-10-CM | POA: Insufficient documentation

## 2015-06-03 MED ORDER — SODIUM CHLORIDE 0.9 % IV SOLN
4.0000 mg/kg | INTRAVENOUS | Status: AC
  Filled 2015-06-03 (×3): qty 14.95

## 2015-06-03 MED ORDER — SODIUM CHLORIDE 0.9 % IV SOLN
4.0000 mg/kg | INTRAVENOUS | Status: DC
  Filled 2015-06-03 (×2): qty 4.56

## 2015-06-03 NOTE — Progress Notes (Signed)
Pt came in today for first dose of IV antibiotic.  Pt was very tearful and has requested that she be allowed to do IV antibiotics at the cancer center because she will be there daily for radiation.  I have called Dr Roney Marion office and talked to [employer].  She is going to call the cancer center to see if patient can get the infusions there.  Pt has also said, " I can not come both places everyday, please help me."

## 2015-06-04 ENCOUNTER — Encounter (HOSPITAL_COMMUNITY): Admission: RE | Admit: 2015-06-04 | Payer: Medicare Other | Source: Ambulatory Visit

## 2015-06-04 ENCOUNTER — Other Ambulatory Visit (HOSPITAL_COMMUNITY): Payer: Self-pay | Admitting: Orthopedic Surgery

## 2015-06-04 ENCOUNTER — Encounter (HOSPITAL_COMMUNITY): Payer: Self-pay

## 2015-06-04 ENCOUNTER — Emergency Department (HOSPITAL_COMMUNITY)
Admission: EM | Admit: 2015-06-04 | Discharge: 2015-06-04 | Disposition: A | Discharge status: Home / Self Care | Payer: Medicare Other | Attending: Emergency Medicine | Admitting: Emergency Medicine

## 2015-06-04 DIAGNOSIS — F419 Anxiety disorder, unspecified: Secondary | ICD-10-CM | POA: Insufficient documentation

## 2015-06-04 DIAGNOSIS — E119 Type 2 diabetes mellitus without complications: Secondary | ICD-10-CM | POA: Insufficient documentation

## 2015-06-04 DIAGNOSIS — Z792 Long term (current) use of antibiotics: Secondary | ICD-10-CM

## 2015-06-04 DIAGNOSIS — G629 Polyneuropathy, unspecified: Secondary | ICD-10-CM | POA: Insufficient documentation

## 2015-06-04 DIAGNOSIS — Z930 Tracheostomy status: Secondary | ICD-10-CM | POA: Insufficient documentation

## 2015-06-04 DIAGNOSIS — M1990 Unspecified osteoarthritis, unspecified site: Secondary | ICD-10-CM | POA: Diagnosis not present

## 2015-06-04 DIAGNOSIS — Z880 Allergy status to penicillin: Secondary | ICD-10-CM | POA: Diagnosis not present

## 2015-06-04 DIAGNOSIS — N189 Chronic kidney disease, unspecified: Secondary | ICD-10-CM | POA: Insufficient documentation

## 2015-06-04 DIAGNOSIS — Z794 Long term (current) use of insulin: Secondary | ICD-10-CM | POA: Diagnosis not present

## 2015-06-04 DIAGNOSIS — Z8742 Personal history of other diseases of the female genital tract: Secondary | ICD-10-CM | POA: Diagnosis not present

## 2015-06-04 DIAGNOSIS — I499 Cardiac arrhythmia, unspecified: Secondary | ICD-10-CM | POA: Diagnosis not present

## 2015-06-04 DIAGNOSIS — D849 Immunodeficiency, unspecified: Secondary | ICD-10-CM | POA: Diagnosis not present

## 2015-06-04 DIAGNOSIS — Z79899 Other long term (current) drug therapy: Secondary | ICD-10-CM | POA: Diagnosis not present

## 2015-06-04 DIAGNOSIS — M109 Gout, unspecified: Secondary | ICD-10-CM | POA: Insufficient documentation

## 2015-06-04 DIAGNOSIS — E785 Hyperlipidemia, unspecified: Secondary | ICD-10-CM | POA: Diagnosis not present

## 2015-06-04 DIAGNOSIS — I509 Heart failure, unspecified: Secondary | ICD-10-CM | POA: Insufficient documentation

## 2015-06-04 DIAGNOSIS — Z8619 Personal history of other infectious and parasitic diseases: Secondary | ICD-10-CM | POA: Diagnosis not present

## 2015-06-04 DIAGNOSIS — Z7951 Long term (current) use of inhaled steroids: Secondary | ICD-10-CM | POA: Diagnosis not present

## 2015-06-04 DIAGNOSIS — Z7901 Long term (current) use of anticoagulants: Secondary | ICD-10-CM | POA: Insufficient documentation

## 2015-06-04 DIAGNOSIS — Z86718 Personal history of other venous thrombosis and embolism: Secondary | ICD-10-CM | POA: Diagnosis not present

## 2015-06-04 DIAGNOSIS — Z8544 Personal history of malignant neoplasm of other female genital organs: Secondary | ICD-10-CM | POA: Diagnosis not present

## 2015-06-04 DIAGNOSIS — Z8701 Personal history of pneumonia (recurrent): Secondary | ICD-10-CM | POA: Diagnosis not present

## 2015-06-04 MED ORDER — SODIUM CHLORIDE 0.9 % IV SOLN
4.0000 mg/kg | INTRAVENOUS | Status: DC
  Filled 2015-06-04: qty 14.95

## 2015-06-04 MED ORDER — DAPTOMYCIN 500 MG IV SOLR
4.0000 mg/kg | Freq: Once | INTRAVENOUS | Status: AC
  Administered 2015-06-04: 747.5 mg via INTRAVENOUS
  Filled 2015-06-04: qty 14.95

## 2015-06-04 NOTE — ED Notes (Signed)
AC spoke to pt and caregiver about going to floor for future IV antibiotic infusions when short stay is closed on the weekend.

## 2015-06-04 NOTE — ED Provider Notes (Signed)
CSN: [id number]     Arrival date & time 06/04/15  1137 History   First MD Initiated Contact with Patient 06/04/15 1225     Chief Complaint  Patient presents with  . IV antibiotics      (Consider location/radiation/quality/duration/timing/severity/associated sxs/prior Treatment) HPI Patient here for dose of intravenous antibiotics. She has prescription for IVCubicin, 4 mg/kg to be infused daily starting 06/06/2015. She received her first dose yesterday. She was advised to come to the emergency department Saturday and Sunday for further doses and then go to [facility] short stay starting 06/06/2015 for further doses patient is presently asymptomatic. She's being treated for soft tissue infection in her fingers. Followed by Dr. Daryll Brod. No other associated symptoms ,Past Medical History  Diagnosis Date  . OSA (obstructive sleep apnea)   . Morbid obesity ([place])   . Depressive disorder, not elsewhere classified   . Diabetes mellitus   . Asthma   . Dyslipidemia   . Clostridium difficile enterocolitis 2012  . Splenomegaly 06/01/2011  . Thrombocytopenia ([place]) 06/01/2011  . Hepatosplenomegaly   . Gout   . GERD (gastroesophageal reflux disease)   . Osteoarthritis   . Polyneuropathy ([place])   . Urinary incontinence   . Amenorrhea   . CHF (congestive heart failure) ([place])   . Hyperlipidemia   . Leg swelling   . Nausea   . Weakness   . Palpitations   . PONV (postoperative nausea and vomiting)   . Anginal pain ([place])     no chest pain in years  . Dysrhythmia     heart palpations  . Anxiety   . Pneumonia   . Shortness of breath   . Chronic kidney disease     overactive bladder  . Peripheral neuropathy ([place])   . DVT (deep venous thrombosis) ([place])   . Endometrial cancer [facility])    Past Surgical History  Procedure Laterality Date  . Cholecystectomy    . Tracheostomy  2001  . Endometrial ablation    . Skin graft    . Finger surgery      ring finger on right hand  . Esophageal  dilation  2003  . Breast biopsy      needle core, left  . Dilation and curettage of uterus      times 2  . Tracheal dilitation  01/24/2012    Procedure: TRACHEAL DILITATION;  Surgeon: Melissa Montane, MD;  Location: [facility];  Service: ENT;  Laterality: N/A;  Trache change with possible dilation, from size 6 to size 7 uncuffed  . Amputation Right 11/19/2013    Procedure: AMPUTATION DIGIT;  Surgeon: Cammie Sickle, MD;  Location: [facility];  Service: Orthopedics;  Laterality: Right;  Partial amputation right long finger, Debridement right ring finger   . Amputation Right 06/03/2014    Procedure: REVISION AMPUTATION RIGHT RING FINGER;  Surgeon: Daryll Brod, MD;  Location: [place] [facility];  Service: Orthopedics;  Laterality: Right;  . Dilatation & currettage/hysteroscopy with resectocope N/A 01/17/2015    Procedure: DILATATION & CURETTAGE/HYSTEROSCOPY WITH RESECTOCOPE; Pap Smear;  Surgeon: Ena Dawley, MD;  Location: [facility];  Service: Gynecology;  Laterality: N/A;  . Intrauterine device (iud) insertion N/A 01/17/2015    Procedure: INTRAUTERINE DEVICE (IUD) INSERTION;  Surgeon: Ena Dawley, MD;  Location: [facility];  Service: Gynecology;  Laterality: N/A;   Family History  Problem Relation Age of Onset  . Diabetes Father   . Hypertension Father   . Dementia Father  Deceased, 47  . Pneumonia Father   . Heart disease Mother   . Clotting disorder Mother   . Hypertension Mother   . Heart attack Mother     Deceased, 9  . Multiple myeloma Paternal Grandmother   . Cancer Paternal Grandmother     multiple myoloma  . Heart disease Paternal Grandfather   . Kidney disease Maternal Grandmother   . Hypertension Sister   . Cancer Paternal Aunt     breast  . Cancer Cousin     breast - all three   Social History  Substance Use Topics  . Smoking status: Never Smoker   . Smokeless tobacco: Never Used  . Alcohol Use: No     Comment: occasional - once or twice a year    OB History    Gravida Para Term Preterm AB TAB SAB Ectopic Multiple Living   0         0     Review of Systems  Respiratory:       Tracheostomy in place  Skin: Positive for wound.       Soft tissue infection of fingers  Allergic/Immunologic: Positive for immunocompromised state.       Diabetic      Allergies  Codeine; Molds & smuts; Allopurinol; Ciprofloxacin; Doxycycline; Fexofenadine; Keflex; Nitroglycerin er; Oritavancin; Sulfa drugs cross reactors; Sulfonamide derivatives; Accolate; Amoxicillin-pot clavulanate; Morphine; Nystatin; and Septra  Home Medications   Prior to Admission medications   Medication Sig Start Date End Date Taking? Authorizing Provider  acetaminophen (TYLENOL) 500 MG tablet Take 1,000 mg by mouth every 6 (six) hours as needed for mild pain. For pain   Yes Historical Provider, MD  albuterol (PROVENTIL) (2.5 MG/3ML) 0.083% nebulizer solution Take 2.5 mg by nebulization every 4 (four) hours as needed. For shortness of breath 05/28/11  Yes Chesley Mires, MD  ALPRAZolam Duanne Moron) 0.5 MG tablet Take 0.5 mg by mouth 2 (two) times daily as needed. For anxiety   Yes Historical Provider, MD  atorvastatin (LIPITOR) 10 MG tablet Take 10 mg by mouth at bedtime.    Yes Historical Provider, MD  B Complex Vitamins (VITAMIN B COMPLEX PO) Take 1 tablet by mouth daily.    Yes Historical Provider, MD  budesonide (PULMICORT) 0.25 MG/2ML nebulizer solution Take 0.25 mg by nebulization 2 (two) times daily as needed (shortness of breath).   Yes Historical Provider, MD  bumetanide (BUMEX) 2 MG tablet Take 1 tablet (2 mg total) by mouth daily. May take one extra 65m tab daily as needed for edema. 10/07/14  Yes JJettie Booze MD  busPIRone (BUSPAR) 5 MG tablet Take 7.5 mg by mouth 2 (two) times daily.    Yes Historical Provider, MD  BYSTOLIC 10 MG tablet TAKE 1 TABLET (10 MG TOTAL) BY MOUTH DAILY. 09/13/14  Yes JJettie Booze MD  cholestyramine (Lucrezia Starch 4 G packet Take 1  packet by mouth as needed. For loose stools, IBS.   Yes Historical Provider, MD  clotrimazole (LOTRIMIN) 1 % cream Apply 1 application topically daily. To stomach   Yes Historical Provider, MD  colchicine 0.6 MG tablet Take 0.6 mg by mouth daily.   Yes Historical Provider, MD  DULoxetine (CYMBALTA) 30 MG capsule TAKE 1 CAPSULE (30 MG TOTAL) BY MOUTH 2 (TWO) TIMES DAILY. 05/11/15  Yes Donika KKeith Rake DO  ergocalciferol (VITAMIN D2) 50000 UNITS capsule Take 50,000 Units by mouth once a week. Take on Fridays   Yes Historical Provider, MD  Febuxostat (ULORIC) 80  MG TABS Take 80 mg by mouth at bedtime.    Yes Historical Provider, MD  fesoterodine (TOVIAZ) 4 MG TB24 Take 4 mg by mouth daily.     Yes Historical Provider, MD  fluticasone (FLONASE) 50 MCG/ACT nasal spray Place 1 spray into both nostrils as needed for allergies or rhinitis.  10/04/14  Yes Historical Provider, MD  gabapentin (NEURONTIN) 100 MG capsule TAKE ONE CAPSULE BY MOUTH EVERY MORNING AND 2 CAPS IN THE AFTERNOON AND 2 CAPS AT BEDTIME Patient taking differently: Take 100 mg by mouth 4 (four) times daily. TAKE ONE CAPSULE BY MOUTH EVERY MORNING AND 1 CAPS AT LUNCH TIME, 1 AT [facility] AND 2 CAPS AT BEDTIME 05/13/14  Yes Donika K Patel, DO  guaiFENesin (MUCINEX) 600 MG 12 hr tablet Take 600 mg by mouth 2 (two) times daily.    Yes Historical Provider, MD  insulin regular human CONCENTRATED (HUMULIN R) 500 UNIT/ML SOLN injection Inject 40-110 Units into the skin 2 (two) times daily before a meal. Patient injects 110 units am before breakfast and pm 40 units before dinner.   Yes Historical Provider, MD  INVOKANA 100 MG TABS tablet Take 100 mg by mouth daily. 03/02/15  Yes Historical Provider, MD  ipratropium (ATROVENT) 0.02 % nebulizer solution Take 0.5 mg by nebulization 4 (four) times daily as needed for wheezing or shortness of breath.   Yes Historical Provider, MD  ketoconazole (NIZORAL) 2 % cream Apply 1 application topically daily. 04/13/15  Yes  Donnel Saxon, CNM  losartan (COZAAR) 100 MG tablet TAKE 1 TABLET (100MG) BY MOUTH DAILY. 11/08/14  Yes Jettie Booze, MD  meclizine (ANTIVERT) 12.5 MG tablet Take 12.5 mg by mouth at bedtime.  12/05/14  Yes Historical Provider, MD  megestrol (MEGACE) 40 MG tablet Take 2 tablets (80 mg total) by mouth 3 (three) times daily. 05/06/15  Yes Everitt Amber, MD  metolazone (ZAROXOLYN) 5 MG tablet TAKE 1 TABLET (5 MG TOTAL) BY MOUTH SEE ADMIN INSTRUCTIONS. 3 TIMES WEEKLY AS NEEDED FOR EDEMA 09/13/14  Yes Jettie Booze, MD  Multiple Vitamin (MULTIVITAMIN) capsule Take 1 capsule by mouth daily.     Yes Historical Provider, MD  mupirocin ointment (BACTROBAN) 2 % APPLY 1 APPLICATION TO AFFECTED AREA 3 TIMES DAILY AS NEEDED FOR INFECTION 03/09/15  Yes Historical Provider, MD  Omega-3 Fatty Acids (OMEGA-3 FISH OIL) 1200 MG CAPS Take 2 capsules (2,400 mg total) by mouth 2 (two) times daily. 09/07/14  Yes Jettie Booze, MD  omeprazole (PRILOSEC) 40 MG capsule Take 40 mg by mouth 2 (two) times daily.    Yes Historical Provider, MD  oxyCODONE-acetaminophen (PERCOCET/ROXICET) 5-325 MG tablet Take 1 tablet 30 minutes before radiation treatments or scans. 06/01/15  Yes Eppie Gibson, MD  potassium chloride SA (K-DUR,KLOR-CON) 20 MEQ tablet Take 20 mEq by mouth 4 (four) times daily. 1 in AM, 2 at lunch, 2 at supper and 1 at bedtime   Yes Historical Provider, MD  primidone (MYSOLINE) 50 MG tablet Take 1 tablet (50 mg total) by mouth 2 (two) times daily. 07/19/14  Yes Donika Keith Rake, DO  Probiotic Product (PROBIOTIC PO) Take 1 tablet by mouth daily.    Yes Historical Provider, MD  promethazine (PHENERGAN) 25 MG tablet Take 25 mg by mouth every 6 (six) hours as needed for nausea or vomiting.   Yes Historical Provider, MD  promethazine-codeine (PHENERGAN WITH CODEINE) 6.25-10 MG/5ML syrup TAKE 1-2 TEASPOONSFUL BY MOUTH EVERY 6 HOURS 03/24/15  Yes Historical Provider, MD  triamcinolone cream (KENALOG) 0.1 % Apply 1  application topically daily as needed. itching 10/27/14  Yes Historical Provider, MD  warfarin (COUMADIN) 5 MG tablet Take 5 mg by mouth daily at 6 PM.    Yes Historical Provider, MD  HUMULIN R U-500 KWIKPEN 500 UNIT/ML injection INJECT (110 UNITS) BEFORE BREAKFAST AND (50 UNITS) BEFORE EVENING MEAL SUBCUTANEOUSLY TWICE A DAY 03/02/15   Historical Provider, MD  VICTOZA 18 MG/3ML SOPN INJECT 1.8MCG ONCE A DAY SUBCUTANEOUS 02/05/15   Historical Provider, MD   BP 159/80 mmHg  Pulse 64  Temp(Src) 98 F (36.7 C) (Oral)  Resp 18  SpO2 99% Physical Exam  Constitutional: No distress.  Alert Glasgow Coma Score 15 chronically ill-appearing  HENT:  Head: Normocephalic and atraumatic.  Eyes: Conjunctivae are normal. Pupils are equal, round, and reactive to light.  Neck: Neck supple. No tracheal deviation present. No thyromegaly present.  Tracheostomy in place  Cardiovascular: Normal rate and regular rhythm.   Pulmonary/Chest: Effort normal.  Abdominal: Soft.  Morbidly obese  Musculoskeletal: Normal range of motion. She exhibits no edema or tenderness.  Oral fingers of both hands wrapped in gauze bandages. Nontender.  Neurological: She is alert. Coordination normal.  Skin: Skin is warm and dry.  Psychiatric: She has a normal mood and affect.  Nursing note and vitals reviewed.   ED Course  Procedures (including critical care time) Labs Review Labs Reviewed - No data to display  Imaging Review No results found. I have personally reviewed and evaluated these images and lab results as part of my medical decision-making.   EKG Interpretation None     She received intravenous antibiotics without complication. MDM  She is to return tomorrow for further dose of intravenous Cubicin Final diagnoses:  None        Orlie Dakin, MD 06/04/15 1530

## 2015-06-04 NOTE — ED Notes (Signed)
Pt has a prescription to have IV antibiotics today and tomorrow in the ER. When asked why pt is receiving the IV antibiotics, pt reports that is not sure exactly what she is getting them for but that she did have a MRSA infection and she now has an infection in the nail beds of her fingers.

## 2015-06-05 ENCOUNTER — Emergency Department (HOSPITAL_COMMUNITY)
Admission: EM | Admit: 2015-06-05 | Discharge: 2015-06-05 | Disposition: A | Discharge status: Other Acute Inpatient Hospital | Payer: Medicare Other

## 2015-06-05 ENCOUNTER — Ambulatory Visit (HOSPITAL_COMMUNITY)
Admission: RE | Admit: 2015-06-05 | Discharge: 2015-06-05 | Disposition: A | Discharge status: Home / Self Care | Payer: Medicare Other | Source: Ambulatory Visit | Attending: Orthopedic Surgery | Admitting: Orthopedic Surgery

## 2015-06-05 DIAGNOSIS — A4902 Methicillin resistant Staphylococcus aureus infection, unspecified site: Secondary | ICD-10-CM | POA: Insufficient documentation

## 2015-06-05 MED ORDER — SODIUM CHLORIDE 0.9 % IV SOLN
750.0000 mg | Freq: Once | INTRAVENOUS | Status: AC
  Administered 2015-06-05: 750 mg via INTRAVENOUS
  Filled 2015-06-05: qty 15

## 2015-06-05 MED ORDER — SODIUM CHLORIDE 0.9 % IV SOLN
750.0000 mg | Freq: Once | INTRAVENOUS | Status: DC
  Filled 2015-06-05: qty 15

## 2015-06-06 ENCOUNTER — Ambulatory Visit
Admission: RE | Admit: 2015-06-06 | Discharge: 2015-06-06 | Disposition: A | Discharge status: Home / Self Care | Payer: Medicare Other | Source: Ambulatory Visit | Attending: Radiation Oncology | Admitting: Radiation Oncology

## 2015-06-06 ENCOUNTER — Other Ambulatory Visit (HOSPITAL_COMMUNITY): Payer: Self-pay | Admitting: Orthopedic Surgery

## 2015-06-06 ENCOUNTER — Encounter (HOSPITAL_COMMUNITY)
Admission: RE | Admit: 2015-06-06 | Discharge: 2015-06-06 | Disposition: A | Discharge status: Home / Self Care | Payer: Medicare Other | Source: Ambulatory Visit | Attending: Orthopedic Surgery | Admitting: Orthopedic Surgery

## 2015-06-06 ENCOUNTER — Encounter (HOSPITAL_COMMUNITY): Payer: Self-pay

## 2015-06-06 ENCOUNTER — Telehealth: Payer: Self-pay

## 2015-06-06 ENCOUNTER — Encounter: Payer: Self-pay | Admitting: Radiation Oncology

## 2015-06-06 VITALS — BP 152/76 | HR 67 | Temp 98.4°F | Ht 65.0 in | Wt >= 6400 oz

## 2015-06-06 DIAGNOSIS — A4902 Methicillin resistant Staphylococcus aureus infection, unspecified site: Secondary | ICD-10-CM | POA: Insufficient documentation

## 2015-06-06 DIAGNOSIS — C541 Malignant neoplasm of endometrium: Secondary | ICD-10-CM

## 2015-06-06 DIAGNOSIS — Z510 Encounter for antineoplastic radiation therapy: Secondary | ICD-10-CM | POA: Diagnosis not present

## 2015-06-06 MED ORDER — SODIUM CHLORIDE 0.9 % IV SOLN
Freq: Once | INTRAVENOUS | Status: AC
  Administered 2015-06-06: 10:00:00 via INTRAVENOUS

## 2015-06-06 MED ORDER — DAPTOMYCIN 500 MG IV SOLR
750.0000 mg | INTRAVENOUS | Status: DC
  Administered 2015-06-06: 750 mg via INTRAVENOUS
  Filled 2015-06-06 (×2): qty 15

## 2015-06-06 NOTE — Progress Notes (Addendum)
[name] presents today after her 1st radiation treatment to her Pelvis. She is receiving IV antibiotics for a "finger infection" daily for 10 days and is concerned about being able to get her radiation treatments in this week. She has spoken with the therapists and they have verbalized to her that they would be able to work her in when needed this week. She does report some vaginal bleeding, but states it has improved slightly since last week and is not as heavy.  She has no other complaints at this time.   BP 152/76 mmHg  Pulse 67  Temp(Src) 98.4 F (36.9 C)  Ht 5\' 5"  (1.651 m)  Wt 400 lb 8 oz (181.666 kg)  BMI 66.65 kg/m2   Wt Readings from Last 3 Encounters:  06/06/15 400 lb 8 oz (181.666 kg)  06/03/15 412 lb (186.882 kg)  06/01/15 412 lb 11.2 oz (187.199 kg)

## 2015-06-06 NOTE — Addendum Note (Signed)
Encounter addended by: Eppie Gibson, MD on: 06/06/2015  4:36 PM<BR>     Documentation filed: Notes Section

## 2015-06-06 NOTE — Progress Notes (Signed)
Weekly Management Note:  Outpatient    ICD-9-CM ICD-10-CM   1. Endometrial cancer (HCC) 182.0 C54.1     Current Dose:  1.8 Gy  Projected Dose: 50.4 Gy   Narrative:  The patient presents for routine under treatment assessment.  CBCT/MVCT images/Port film x-rays were reviewed.  The chart was checked. Started IV antibiotics for finger infections.  Caregiver also noted purplish skin over buttocks.  Otherwise no new complaints.  Physical Findings:  Wt Readings from Last 3 Encounters:  06/03/15 412 lb (186.882 kg)  06/01/15 412 lb 11.2 oz (187.199 kg)  04/28/15 413 lb 14.4 oz (187.744 kg)    400lb today Vitals with Age-Percentiles 06/06/2015  Length 0000000 cm  Systolic 0000000  Diastolic 76  MAP   Pulse 67  Respiration   Weight 181.666 kg  BMI 66.8  VISIT REPORT   NAD, in WC, bandages over several fingers.  CBC    Component Value Date/Time   WBC 8.1 06/01/2015 1128   WBC 5.3 01/10/2015 1352   WBC 7.7 02/14/2010 1446   RBC 4.60 06/01/2015 1128   RBC 3.93 01/10/2015 1352   RBC 4.20 02/14/2010 1446   HGB 13.6 06/01/2015 1128   HGB 12.2 01/10/2015 1352   HGB 13.3 02/14/2010 1446   HCT 41.6 06/01/2015 1128   HCT 36.4 01/10/2015 1352   HCT 38.4 02/14/2010 1446   PLT 158 06/01/2015 1128   PLT 108* 01/10/2015 1352   PLT 189 02/14/2010 1446   MCV 90.4 06/01/2015 1128   MCV 92.6 01/10/2015 1352   MCV 91 02/14/2010 1446   MCH 29.7 06/01/2015 1128   MCH 31.0 01/10/2015 1352   MCH 31.6 02/14/2010 1446   MCHC 32.8 06/01/2015 1128   MCHC 33.5 01/10/2015 1352   MCHC 34.6 02/14/2010 1446   RDW 15.0* 06/01/2015 1128   RDW 14.2 01/10/2015 1352   RDW 13.5 02/14/2010 1446   LYMPHSABS 2.4 06/01/2015 1128   LYMPHSABS 1.8 11/08/2014 1016   MONOABS 1.1* 06/01/2015 1128   MONOABS 0.8 11/08/2014 1016   EOSABS 0.2 06/01/2015 1128   EOSABS 0.1 11/08/2014 1016   BASOSABS 0.1 06/01/2015 1128   BASOSABS 0.0 11/08/2014 1016     CMP     Component Value Date/Time   NA 135* 06/01/2015  1128   NA 134* 01/17/2015 1515   NA 137 02/14/2010 1446   K 4.4 06/01/2015 1128   K 4.0 01/17/2015 1515   K 4.1 02/14/2010 1446   CL 101 01/17/2015 1515   CL 96* 02/14/2010 1446   CO2 21* 06/01/2015 1128   CO2 27 01/17/2015 1515   CO2 26 02/14/2010 1446   GLUCOSE 291* 06/01/2015 1128   GLUCOSE 274* 01/17/2015 1515   GLUCOSE 171* 02/14/2010 1446   BUN 14.8 06/01/2015 1128   BUN 11 01/17/2015 1515   BUN 29* 02/14/2010 1446   CREATININE 1.2* 06/01/2015 1128   CREATININE 1.10* 01/17/2015 1515   CREATININE 1.0 02/14/2010 1446   CALCIUM 9.4 06/01/2015 1128   CALCIUM 8.0* 01/17/2015 1515   CALCIUM 9.0 02/14/2010 1446   PROT 8.1 06/01/2015 1128   PROT 7.0 01/17/2015 1515   PROT 7.7 02/14/2010 1446   ALBUMIN 3.5 06/01/2015 1128   ALBUMIN 3.4* 01/17/2015 1515   ALBUMIN 3.7 02/14/2010 1446   AST 16 06/01/2015 1128   AST 26 01/17/2015 1515   AST 39* 02/14/2010 1446   ALT 17 06/01/2015 1128   ALT 25 01/17/2015 1515   ALT 44 02/14/2010 1446   ALKPHOS 66  06/01/2015 1128   ALKPHOS 45 01/17/2015 1515   ALKPHOS 42 02/14/2010 1446   BILITOT 0.57 06/01/2015 1128   BILITOT 0.7 01/17/2015 1515   BILITOT 0.40 02/14/2010 1446   GFRNONAA 57* 01/17/2015 1515   GFRAA >60 01/17/2015 1515     Impression:  The patient is tolerating radiotherapy.   Plan:  Continue radiotherapy as planned.    -----------------------------------  Eppie Gibson, MD

## 2015-06-06 NOTE — Telephone Encounter (Signed)
[name] called from short stay at [facility] just now. She is receiving IV antibiotics for a "finger infection". She asked if her appointment could be moved to an earlier appointment today because transportation is very difficult for her. I was able to discuss with [facility] and they were able to change her appointment to 12:00 which [name] was agreeable to. She states she is currently very stressed about having to receive antibiotics and radiation this week, and would like to discuss with Dr. Isidore Moos about delaying her treatment till next week. I asked her to come before her 12:00 treatment today to discuss her concerns with Dr. Isidore Moos and she agreed to this plan.

## 2015-06-07 ENCOUNTER — Encounter (HOSPITAL_COMMUNITY)
Admission: RE | Admit: 2015-06-07 | Discharge: 2015-06-07 | Disposition: A | Discharge status: Home / Self Care | Payer: Medicare Other | Source: Ambulatory Visit | Attending: Orthopedic Surgery | Admitting: Orthopedic Surgery

## 2015-06-07 ENCOUNTER — Encounter (HOSPITAL_COMMUNITY): Payer: Medicare Other

## 2015-06-07 ENCOUNTER — Encounter (HOSPITAL_COMMUNITY): Payer: Self-pay

## 2015-06-07 ENCOUNTER — Ambulatory Visit
Admission: RE | Admit: 2015-06-07 | Discharge: 2015-06-07 | Disposition: A | Discharge status: Home / Self Care | Payer: Medicare Other | Source: Ambulatory Visit | Attending: Radiation Oncology | Admitting: Radiation Oncology

## 2015-06-07 DIAGNOSIS — A4902 Methicillin resistant Staphylococcus aureus infection, unspecified site: Secondary | ICD-10-CM | POA: Diagnosis not present

## 2015-06-07 DIAGNOSIS — Z510 Encounter for antineoplastic radiation therapy: Secondary | ICD-10-CM | POA: Diagnosis not present

## 2015-06-07 MED ORDER — SODIUM CHLORIDE 0.9 % IV SOLN
Freq: Once | INTRAVENOUS | Status: AC
  Administered 2015-06-07: 09:00:00 via INTRAVENOUS

## 2015-06-07 MED ORDER — SODIUM CHLORIDE 0.9 % IJ SOLN
10.0000 mL | INTRAMUSCULAR | Status: DC | PRN
  Administered 2015-06-07: 10 mL via INTRAVENOUS
  Filled 2015-06-07: qty 10

## 2015-06-07 MED ORDER — SODIUM CHLORIDE 0.9 % IV SOLN
750.0000 mg | Freq: Once | INTRAVENOUS | Status: AC
  Administered 2015-06-07: 750 mg via INTRAVENOUS
  Filled 2015-06-07: qty 15

## 2015-06-07 NOTE — Discharge Instructions (Signed)
Daptomycin injection  What is this medicine?  DAPTOMYCIN (DAP toe MYE sin) is a lipopeptide antibiotic. It is used to treat certain kinds of bacterial infections. It will not work for colds, flu, or other viral infections.  This medicine may be used for other purposes; ask your health care provider or pharmacist if you have questions.  What should I tell my health care provider before I take this medicine?  They need to know if you have any of these conditions:  -kidney disease  -an unusual or allergic reaction to daptomycin, other medicines, foods, dyes, or preservatives  -pregnant or trying to get pregnant  -breast-feeding  How should I use this medicine?  This medicine is for infusion into a vein. It is usually given by a health care professional in a hospital or clinic setting.  If you get this medicine at home, you will be taught how to prepare and give this medicine. Use exactly as directed. Take your medicine at regular intervals. Do not take your medicine more often than directed. Take all of your medicine as directed even if you think you are better. Do not skip doses or stop your medicine early.  It is important that you put your used needles and syringes in a special sharps container. Do not put them in a trash can. If you do not have a sharps container, call your pharmacist or healthcare provider to get one.  Talk to your pediatrician regarding the use of this medicine in children. Special care may be needed.  Overdosage: If you think you have taken too much of this medicine contact a poison control center or emergency room at once.  NOTE: This medicine is only for you. Do not share this medicine with others.  What if I miss a dose?  If you miss a dose, take it as soon as you can. If it is almost time for your next dose, take only that dose. Do not take double or extra doses.  What may interact with this medicine?  -birth control pills  -some antibiotics like tobramycin  This list may not describe all  possible interactions. Give your health care provider a list of all the medicines, herbs, non-prescription drugs, or dietary supplements you use. Also tell them if you smoke, drink alcohol, or use illegal drugs. Some items may interact with your medicine.  What should I watch for while using this medicine?  Your condition will be monitored carefully while you are receiving this medicine.  Do not treat diarrhea with over the counter products. Contact your doctor if you have diarrhea that lasts more than 2 days or if it is severe and watery.  What side effects may I notice from receiving this medicine?  Side effects that you should report to your doctor or health care professional as soon as possible:  -allergic reactions like skin rash, itching or hives, swelling of the face, lips, or tongue  -breathing problems  -fever, infection  -high or low blood pressure  -muscle pain  -numb or tingling pain  -trouble passing urine or change in the amount of urine  -unusually tired or weak  -vomiting  Side effects that usually do not require medical attention (report to your doctor or health care professional if they continue or are bothersome):  -constipation or diarrhea  -trouble sleeping  -headache  -nausea  -stomach upset  This list may not describe all possible side effects. Call your doctor for medical advice about side effects. You may report   side effects to FDA at 1-800-FDA-1088.  Where should I keep my medicine?  Keep out of the reach of children.  If you are using this medicine at home, you will be instructed on how to store this medicine. Throw away any unused medicine after the expiration date on the label.  NOTE: This sheet is a summary. It may not cover all possible information. If you have questions about this medicine, talk to your doctor, pharmacist, or health care provider.      2016, [employer]vier/[employer] Standard. (2013-01-16 07:33:48)

## 2015-06-08 ENCOUNTER — Encounter (HOSPITAL_COMMUNITY): Payer: Self-pay

## 2015-06-08 ENCOUNTER — Ambulatory Visit
Admission: RE | Admit: 2015-06-08 | Discharge: 2015-06-08 | Disposition: A | Discharge status: Home / Self Care | Payer: Medicare Other | Source: Ambulatory Visit | Attending: Radiation Oncology | Admitting: Radiation Oncology

## 2015-06-08 ENCOUNTER — Encounter (HOSPITAL_COMMUNITY)
Admission: RE | Admit: 2015-06-08 | Discharge: 2015-06-08 | Disposition: A | Discharge status: Home / Self Care | Payer: Medicare Other | Source: Ambulatory Visit | Attending: Orthopedic Surgery | Admitting: Orthopedic Surgery

## 2015-06-08 ENCOUNTER — Encounter (HOSPITAL_BASED_OUTPATIENT_CLINIC_OR_DEPARTMENT_OTHER): Payer: Medicare Other | Attending: Surgery

## 2015-06-08 DIAGNOSIS — Z510 Encounter for antineoplastic radiation therapy: Secondary | ICD-10-CM | POA: Diagnosis not present

## 2015-06-08 DIAGNOSIS — A4902 Methicillin resistant Staphylococcus aureus infection, unspecified site: Secondary | ICD-10-CM | POA: Diagnosis not present

## 2015-06-08 MED ORDER — SODIUM CHLORIDE 0.9 % IV SOLN
Freq: Once | INTRAVENOUS | Status: AC
  Administered 2015-06-08: 10:00:00 via INTRAVENOUS

## 2015-06-08 MED ORDER — SODIUM CHLORIDE 0.9 % IV SOLN
750.0000 mg | Freq: Once | INTRAVENOUS | Status: AC
  Administered 2015-06-08: 750 mg via INTRAVENOUS
  Filled 2015-06-08: qty 15

## 2015-06-08 MED ORDER — SODIUM CHLORIDE 0.9 % IJ SOLN
10.0000 mL | INTRAMUSCULAR | Status: DC | PRN
  Administered 2015-06-08: 10 mL via INTRAVENOUS
  Filled 2015-06-08: qty 10

## 2015-06-08 NOTE — Progress Notes (Signed)
Pt, arrived to floor, pt. C/o muscle pain since last night and being very tired, Pt. Stated" The pain is not like anything that I have had before", [name] notified of pt. Being very tired and muscle pain, and receiving 5 days of Cubicin, order received to continue with Cubicin, and for pt. To come to the office after radiation therapy today.

## 2015-06-08 NOTE — Discharge Instructions (Signed)
Daptomycin injection  What is this medicine?  DAPTOMYCIN (DAP toe MYE sin) is a lipopeptide antibiotic. It is used to treat certain kinds of bacterial infections. It will not work for colds, flu, or other viral infections.  This medicine may be used for other purposes; ask your health care provider or pharmacist if you have questions.  What should I tell my health care provider before I take this medicine?  They need to know if you have any of these conditions:  -kidney disease  -an unusual or allergic reaction to daptomycin, other medicines, foods, dyes, or preservatives  -pregnant or trying to get pregnant  -breast-feeding  How should I use this medicine?  This medicine is for infusion into a vein. It is usually given by a health care professional in a hospital or clinic setting.  If you get this medicine at home, you will be taught how to prepare and give this medicine. Use exactly as directed. Take your medicine at regular intervals. Do not take your medicine more often than directed. Take all of your medicine as directed even if you think you are better. Do not skip doses or stop your medicine early.  It is important that you put your used needles and syringes in a special sharps container. Do not put them in a trash can. If you do not have a sharps container, call your pharmacist or healthcare provider to get one.  Talk to your pediatrician regarding the use of this medicine in children. Special care may be needed.  Overdosage: If you think you have taken too much of this medicine contact a poison control center or emergency room at once.  NOTE: This medicine is only for you. Do not share this medicine with others.  What if I miss a dose?  If you miss a dose, take it as soon as you can. If it is almost time for your next dose, take only that dose. Do not take double or extra doses.  What may interact with this medicine?  -birth control pills  -some antibiotics like tobramycin  This list may not describe all  possible interactions. Give your health care provider a list of all the medicines, herbs, non-prescription drugs, or dietary supplements you use. Also tell them if you smoke, drink alcohol, or use illegal drugs. Some items may interact with your medicine.  What should I watch for while using this medicine?  Your condition will be monitored carefully while you are receiving this medicine.  Do not treat diarrhea with over the counter products. Contact your doctor if you have diarrhea that lasts more than 2 days or if it is severe and watery.  What side effects may I notice from receiving this medicine?  Side effects that you should report to your doctor or health care professional as soon as possible:  -allergic reactions like skin rash, itching or hives, swelling of the face, lips, or tongue  -breathing problems  -fever, infection  -high or low blood pressure  -muscle pain  -numb or tingling pain  -trouble passing urine or change in the amount of urine  -unusually tired or weak  -vomiting  Side effects that usually do not require medical attention (report to your doctor or health care professional if they continue or are bothersome):  -constipation or diarrhea  -trouble sleeping  -headache  -nausea  -stomach upset  This list may not describe all possible side effects. Call your doctor for medical advice about side effects. You may report   side effects to FDA at 1-800-FDA-1088.  Where should I keep my medicine?  Keep out of the reach of children.  If you are using this medicine at home, you will be instructed on how to store this medicine. Throw away any unused medicine after the expiration date on the label.  NOTE: This sheet is a summary. It may not cover all possible information. If you have questions about this medicine, talk to your doctor, pharmacist, or health care provider.      2016, [employer]vier/[employer] Standard. (2013-01-16 07:33:48)

## 2015-06-09 ENCOUNTER — Encounter (HOSPITAL_COMMUNITY): Payer: Medicare Other

## 2015-06-09 ENCOUNTER — Other Ambulatory Visit: Payer: Medicare Other

## 2015-06-09 ENCOUNTER — Ambulatory Visit
Admission: RE | Admit: 2015-06-09 | Discharge: 2015-06-09 | Disposition: A | Discharge status: Home / Self Care | Payer: Medicare Other | Source: Ambulatory Visit | Attending: Radiation Oncology | Admitting: Radiation Oncology

## 2015-06-09 ENCOUNTER — Encounter (HOSPITAL_COMMUNITY)
Admission: RE | Admit: 2015-06-09 | Discharge: 2015-06-09 | Disposition: A | Discharge status: Home / Self Care | Payer: Medicare Other | Source: Ambulatory Visit | Attending: Orthopedic Surgery | Admitting: Orthopedic Surgery

## 2015-06-09 DIAGNOSIS — A4902 Methicillin resistant Staphylococcus aureus infection, unspecified site: Secondary | ICD-10-CM | POA: Diagnosis not present

## 2015-06-09 DIAGNOSIS — Z510 Encounter for antineoplastic radiation therapy: Secondary | ICD-10-CM | POA: Diagnosis not present

## 2015-06-09 MED ORDER — SODIUM CHLORIDE 0.9 % IV SOLN
INTRAVENOUS | Status: DC
  Administered 2015-06-09: 250 mL via INTRAVENOUS

## 2015-06-09 MED ORDER — SODIUM CHLORIDE 0.9 % IV SOLN
750.0000 mg | Freq: Every day | INTRAVENOUS | Status: DC
  Administered 2015-06-09: 750 mg via INTRAVENOUS
  Filled 2015-06-09 (×2): qty 15

## 2015-06-09 MED ORDER — SODIUM CHLORIDE 0.9 % IJ SOLN: 10.0000 mL | Freq: Every day | INTRAMUSCULAR | Status: DC

## 2015-06-10 ENCOUNTER — Encounter (HOSPITAL_COMMUNITY): Payer: Self-pay

## 2015-06-10 ENCOUNTER — Ambulatory Visit
Admission: RE | Admit: 2015-06-10 | Discharge: 2015-06-10 | Disposition: A | Discharge status: Home / Self Care | Payer: Medicare Other | Source: Ambulatory Visit | Attending: Radiation Oncology | Admitting: Radiation Oncology

## 2015-06-10 ENCOUNTER — Encounter (HOSPITAL_COMMUNITY)
Admission: RE | Admit: 2015-06-10 | Discharge: 2015-06-10 | Disposition: A | Discharge status: Home / Self Care | Payer: Medicare Other | Source: Ambulatory Visit | Attending: Orthopedic Surgery | Admitting: Orthopedic Surgery

## 2015-06-10 ENCOUNTER — Encounter (HOSPITAL_COMMUNITY): Payer: Medicare Other

## 2015-06-10 DIAGNOSIS — Z510 Encounter for antineoplastic radiation therapy: Secondary | ICD-10-CM | POA: Diagnosis not present

## 2015-06-10 DIAGNOSIS — A4902 Methicillin resistant Staphylococcus aureus infection, unspecified site: Secondary | ICD-10-CM | POA: Diagnosis not present

## 2015-06-10 MED ORDER — SODIUM CHLORIDE 0.9 % IV SOLN
INTRAVENOUS | Status: DC
  Administered 2015-06-10: 250 mL via INTRAVENOUS

## 2015-06-10 MED ORDER — SODIUM CHLORIDE 0.9 % IV SOLN
750.0000 mg | Freq: Every day | INTRAVENOUS | Status: DC
  Administered 2015-06-10: 750 mg via INTRAVENOUS
  Filled 2015-06-10 (×2): qty 15

## 2015-06-10 MED ORDER — SODIUM CHLORIDE 0.9 % IJ SOLN: 10.0000 mL | Freq: Every day | INTRAMUSCULAR | Status: DC

## 2015-06-11 ENCOUNTER — Encounter (HOSPITAL_COMMUNITY)
Admission: RE | Admit: 2015-06-11 | Discharge: 2015-06-11 | Disposition: A | Discharge status: Home / Self Care | Payer: Medicare Other | Source: Ambulatory Visit | Attending: Orthopedic Surgery | Admitting: Orthopedic Surgery

## 2015-06-11 DIAGNOSIS — A4902 Methicillin resistant Staphylococcus aureus infection, unspecified site: Secondary | ICD-10-CM | POA: Diagnosis not present

## 2015-06-11 MED ORDER — SODIUM CHLORIDE 0.9 % IV SOLN
750.0000 mg | Freq: Every day | INTRAVENOUS | Status: DC
  Administered 2015-06-11: 750 mg via INTRAVENOUS
  Filled 2015-06-11 (×2): qty 15

## 2015-06-11 MED ORDER — SODIUM CHLORIDE 0.9 % IJ SOLN
10.0000 mL | Freq: Every day | INTRAMUSCULAR | Status: DC
  Administered 2015-06-11: 10 mL via INTRAVENOUS

## 2015-06-11 MED ORDER — SODIUM CHLORIDE 0.9 % IV SOLN
INTRAVENOUS | Status: DC
  Administered 2015-06-11: 10:00:00 via INTRAVENOUS

## 2015-06-11 NOTE — Progress Notes (Signed)
Pt arrived to unit to receive OP iv abx. Anaija Wissink, [employer]

## 2015-06-11 NOTE — Progress Notes (Signed)
Abx completed, line deaccessed & flushed. Pt discharged to home. To keep all appointments for remainder of abx dosing. Ott Zimmerle, [employer]

## 2015-06-12 ENCOUNTER — Encounter (HOSPITAL_COMMUNITY)
Admission: RE | Admit: 2015-06-12 | Discharge: 2015-06-12 | Disposition: A | Discharge status: Home / Self Care | Payer: Medicare Other | Source: Ambulatory Visit | Attending: Orthopedic Surgery | Admitting: Orthopedic Surgery

## 2015-06-12 DIAGNOSIS — A4902 Methicillin resistant Staphylococcus aureus infection, unspecified site: Secondary | ICD-10-CM | POA: Diagnosis not present

## 2015-06-12 MED ORDER — SODIUM CHLORIDE 0.9 % IV SOLN
750.0000 mg | Freq: Every day | INTRAVENOUS | Status: DC
  Administered 2015-06-12: 750 mg via INTRAVENOUS
  Filled 2015-06-12 (×2): qty 15

## 2015-06-12 NOTE — Progress Notes (Signed)
Discharged from not after abx complete & site deaccessed. To return to short stay tomorrow. [name], [employer]

## 2015-06-12 NOTE — Progress Notes (Signed)
Pt arrived on unit to receive OP Abx. [name], [employer]

## 2015-06-13 ENCOUNTER — Ambulatory Visit
Admission: RE | Admit: 2015-06-13 | Discharge: 2015-06-13 | Disposition: A | Discharge status: Home / Self Care | Payer: Medicare Other | Source: Ambulatory Visit | Attending: Radiation Oncology | Admitting: Radiation Oncology

## 2015-06-13 ENCOUNTER — Encounter (HOSPITAL_COMMUNITY)
Admission: RE | Admit: 2015-06-13 | Discharge: 2015-06-13 | Disposition: A | Discharge status: Home / Self Care | Payer: Medicare Other | Source: Ambulatory Visit | Attending: Orthopedic Surgery | Admitting: Orthopedic Surgery

## 2015-06-13 ENCOUNTER — Encounter: Payer: Self-pay | Admitting: Radiation Oncology

## 2015-06-13 ENCOUNTER — Encounter (HOSPITAL_COMMUNITY): Payer: Self-pay

## 2015-06-13 VITALS — BP 124/66 | HR 68 | Temp 98.2°F | Ht 65.0 in | Wt >= 6400 oz

## 2015-06-13 DIAGNOSIS — A4902 Methicillin resistant Staphylococcus aureus infection, unspecified site: Secondary | ICD-10-CM | POA: Diagnosis not present

## 2015-06-13 DIAGNOSIS — Z510 Encounter for antineoplastic radiation therapy: Secondary | ICD-10-CM | POA: Diagnosis not present

## 2015-06-13 DIAGNOSIS — C541 Malignant neoplasm of endometrium: Secondary | ICD-10-CM

## 2015-06-13 MED ORDER — SODIUM CHLORIDE 0.9 % IV SOLN
250.0000 mL | INTRAVENOUS | Status: DC
Start: 2015-06-13 — End: 2015-06-14
  Administered 2015-06-13: 250 mL via INTRAVENOUS

## 2015-06-13 MED ORDER — SODIUM CHLORIDE 0.9 % IV SOLN
750.0000 mg | INTRAVENOUS | Status: DC
  Administered 2015-06-13: 750 mg via INTRAVENOUS
  Filled 2015-06-13 (×2): qty 15

## 2015-06-13 NOTE — Progress Notes (Signed)
Weekly Management Note:  Outpatient    ICD-9-CM ICD-10-CM   1. Endometrial cancer ([place]) 182.0 C54.1 Ambulatory referral to Radiation Oncology    Current Dose:  10.8 Gy  Projected Dose: 45-50.4 Gy to pelvis  +/- brachy boost  Narrative:  The patient presents for routine under treatment assessment.  CBCT/MVCT images/Port film x-rays were reviewed.  The chart was checked. Reports occasional stable diarrhea and possibly an increase urinary urgency  Physical Findings:  Wt Readings from Last 3 Encounters:  06/13/15 407 lb (184.614 kg)  06/06/15 400 lb 8 oz (181.666 kg)  06/03/15 412 lb (186.882 kg)   Filed Vitals:   06/13/15 1210  BP: 124/66  Pulse: 68  Temp: 98.2 F (36.8 C)    NAD, in WC, bandages over several fingers.  CBC    Component Value Date/Time   WBC 8.1 06/01/2015 1128   WBC 5.3 01/10/2015 1352   WBC 7.7 02/14/2010 1446   RBC 4.60 06/01/2015 1128   RBC 3.93 01/10/2015 1352   RBC 4.20 02/14/2010 1446   HGB 13.6 06/01/2015 1128   HGB 12.2 01/10/2015 1352   HGB 13.3 02/14/2010 1446   HCT 41.6 06/01/2015 1128   HCT 36.4 01/10/2015 1352   HCT 38.4 02/14/2010 1446   PLT 158 06/01/2015 1128   PLT 108* 01/10/2015 1352   PLT 189 02/14/2010 1446   MCV 90.4 06/01/2015 1128   MCV 92.6 01/10/2015 1352   MCV 91 02/14/2010 1446   MCH 29.7 06/01/2015 1128   MCH 31.0 01/10/2015 1352   MCH 31.6 02/14/2010 1446   MCHC 32.8 06/01/2015 1128   MCHC 33.5 01/10/2015 1352   MCHC 34.6 02/14/2010 1446   RDW 15.0* 06/01/2015 1128   RDW 14.2 01/10/2015 1352   RDW 13.5 02/14/2010 1446   LYMPHSABS 2.4 06/01/2015 1128   LYMPHSABS 1.8 11/08/2014 1016   MONOABS 1.1* 06/01/2015 1128   MONOABS 0.8 11/08/2014 1016   EOSABS 0.2 06/01/2015 1128   EOSABS 0.1 11/08/2014 1016   BASOSABS 0.1 06/01/2015 1128   BASOSABS 0.0 11/08/2014 1016     CMP     Component Value Date/Time   NA 135* 06/01/2015 1128   NA 134* 01/17/2015 1515   NA 137 02/14/2010 1446   K 4.4 06/01/2015 1128   K 4.0 01/17/2015 1515   K 4.1 02/14/2010 1446   CL 101 01/17/2015 1515   CL 96* 02/14/2010 1446   CO2 21* 06/01/2015 1128   CO2 27 01/17/2015 1515   CO2 26 02/14/2010 1446   GLUCOSE 291* 06/01/2015 1128   GLUCOSE 274* 01/17/2015 1515   GLUCOSE 171* 02/14/2010 1446   BUN 14.8 06/01/2015 1128   BUN 11 01/17/2015 1515   BUN 29* 02/14/2010 1446   CREATININE 1.2* 06/01/2015 1128   CREATININE 1.10* 01/17/2015 1515   CREATININE 1.0 02/14/2010 1446   CALCIUM 9.4 06/01/2015 1128   CALCIUM 8.0* 01/17/2015 1515   CALCIUM 9.0 02/14/2010 1446   PROT 8.1 06/01/2015 1128   PROT 7.0 01/17/2015 1515   PROT 7.7 02/14/2010 1446   ALBUMIN 3.5 06/01/2015 1128   ALBUMIN 3.4* 01/17/2015 1515   ALBUMIN 3.7 02/14/2010 1446   AST 16 06/01/2015 1128   AST 26 01/17/2015 1515   AST 39* 02/14/2010 1446   ALT 17 06/01/2015 1128   ALT 25 01/17/2015 1515   ALT 44 02/14/2010 1446   ALKPHOS 66 06/01/2015 1128   ALKPHOS 45 01/17/2015 1515   ALKPHOS 42 02/14/2010 1446   BILITOT 0.57 06/01/2015 1128  BILITOT 0.7 01/17/2015 1515   BILITOT 0.40 02/14/2010 1446   GFRNONAA 57* 01/17/2015 1515   GFRAA >60 01/17/2015 1515     Impression:  The patient is tolerating radiotherapy.   Plan:  Continue radiotherapy as planned.  I spoke with Dr. Cordelia Pen at [facility] rad/onc, she will see patient as a consult for possible brachytherapy boost to uterus.  -----------------------------------  Eppie Gibson, MD

## 2015-06-13 NOTE — Progress Notes (Signed)
[name] is here for her 6th fraction of radiation to her Pelvis. She admits to fatigue. She also reports increased urgency to void, and sometimes is unable to make it to the bathroom. She also reports two episodes of diarrhea this past Wednesday and Thursday which she is taking [place] when she has diarrhea. She denies any pain, but does admit to aniexty related to her caregivers and transportation to radiation.   BP 124/66 mmHg  Pulse 68  Temp(Src) 98.2 F (36.8 C)  Ht 5\' 5"  (1.651 m)  Wt 407 lb (184.614 kg)  BMI 67.73 kg/m2   Wt Readings from Last 3 Encounters:  06/13/15 407 lb (184.614 kg)  06/06/15 400 lb 8 oz (181.666 kg)  06/03/15 412 lb (186.882 kg)

## 2015-06-13 NOTE — Progress Notes (Signed)
Pt here for patient teaching.  Pt given Radiation and You booklet and skin care instructions. Pt reports they have not watched the Radiation Therapy Education video, but were given the link.  Reviewed areas of pertinence such as diarrhea, fatigue, hair loss, nausea and vomiting, skin changes, urinary and bladder changes, breast swelling, cough, shortness of breath, earaches and taste changes . Pt able to give teach back of ,. Pt verbalizes understanding of information given and will contact nursing with any questions or concerns.

## 2015-06-14 ENCOUNTER — Ambulatory Visit
Admission: RE | Admit: 2015-06-14 | Discharge: 2015-06-14 | Disposition: A | Discharge status: Home / Self Care | Payer: Medicare Other | Source: Ambulatory Visit | Attending: Radiation Oncology | Admitting: Radiation Oncology

## 2015-06-14 ENCOUNTER — Telehealth: Payer: Self-pay | Admitting: *Deleted

## 2015-06-14 ENCOUNTER — Encounter (HOSPITAL_COMMUNITY)
Admission: RE | Admit: 2015-06-14 | Discharge: 2015-06-14 | Disposition: A | Discharge status: Home / Self Care | Payer: Medicare Other | Source: Ambulatory Visit | Attending: Orthopedic Surgery | Admitting: Orthopedic Surgery

## 2015-06-14 ENCOUNTER — Encounter (HOSPITAL_COMMUNITY): Payer: Self-pay

## 2015-06-14 DIAGNOSIS — Z510 Encounter for antineoplastic radiation therapy: Secondary | ICD-10-CM | POA: Diagnosis not present

## 2015-06-14 DIAGNOSIS — A4902 Methicillin resistant Staphylococcus aureus infection, unspecified site: Secondary | ICD-10-CM | POA: Diagnosis not present

## 2015-06-14 MED ORDER — SODIUM CHLORIDE 0.9 % IV SOLN
750.0000 mg | INTRAVENOUS | Status: DC
  Administered 2015-06-14: 750 mg via INTRAVENOUS
  Filled 2015-06-14 (×2): qty 15

## 2015-06-14 MED ORDER — SODIUM CHLORIDE 0.9 % IJ SOLN
10.0000 mL | INTRAMUSCULAR | Status: DC | PRN
  Administered 2015-06-14: 10 mL via INTRAVENOUS
  Filled 2015-06-14: qty 10

## 2015-06-14 MED ORDER — SODIUM CHLORIDE 0.9 % IV SOLN
250.0000 mL | INTRAVENOUS | Status: AC
  Administered 2015-06-14: 250 mL via INTRAVENOUS

## 2015-06-14 NOTE — Telephone Encounter (Signed)
CALLED PATIENT TO INFORM OF APPT. WITH DR. Cordelia Pen ON 06-28-15- ARRIVAL TIME - 3:45 PM, LVM FOR A RETURN CALL

## 2015-06-15 ENCOUNTER — Encounter (HOSPITAL_COMMUNITY)
Admission: RE | Admit: 2015-06-15 | Discharge: 2015-06-15 | Disposition: A | Discharge status: Home / Self Care | Payer: Medicare Other | Source: Ambulatory Visit | Attending: Orthopedic Surgery | Admitting: Orthopedic Surgery

## 2015-06-15 ENCOUNTER — Other Ambulatory Visit: Payer: Self-pay | Admitting: Orthopedic Surgery

## 2015-06-15 ENCOUNTER — Encounter (HOSPITAL_COMMUNITY): Payer: Self-pay

## 2015-06-15 ENCOUNTER — Ambulatory Visit
Admission: RE | Admit: 2015-06-15 | Discharge: 2015-06-15 | Disposition: A | Discharge status: Home / Self Care | Payer: Medicare Other | Source: Ambulatory Visit | Attending: Radiation Oncology | Admitting: Radiation Oncology

## 2015-06-15 ENCOUNTER — Other Ambulatory Visit (HOSPITAL_COMMUNITY): Payer: Self-pay | Admitting: *Deleted

## 2015-06-15 ENCOUNTER — Encounter (HOSPITAL_COMMUNITY): Payer: Self-pay | Admitting: *Deleted

## 2015-06-15 DIAGNOSIS — A4902 Methicillin resistant Staphylococcus aureus infection, unspecified site: Secondary | ICD-10-CM | POA: Diagnosis not present

## 2015-06-15 DIAGNOSIS — Z510 Encounter for antineoplastic radiation therapy: Secondary | ICD-10-CM | POA: Diagnosis not present

## 2015-06-15 MED ORDER — SODIUM CHLORIDE 0.9 % IV SOLN
250.0000 mL | INTRAVENOUS | Status: DC
  Administered 2015-06-15: 250 mL via INTRAVENOUS

## 2015-06-15 MED ORDER — SODIUM CHLORIDE 0.9 % IV SOLN
750.0000 mg | INTRAVENOUS | Status: AC
  Administered 2015-06-15: 750 mg via INTRAVENOUS
  Filled 2015-06-15: qty 15

## 2015-06-15 MED ORDER — SODIUM CHLORIDE 0.9 % IJ SOLN
10.0000 mL | INTRAMUSCULAR | Status: AC | PRN
  Administered 2015-06-15: 10 mL via INTRAVENOUS

## 2015-06-15 NOTE — Discharge Instructions (Signed)
Daptomycin injection  What is this medicine?  DAPTOMYCIN (DAP toe MYE sin) is a lipopeptide antibiotic. It is used to treat certain kinds of bacterial infections. It will not work for colds, flu, or other viral infections.  This medicine may be used for other purposes; ask your health care provider or pharmacist if you have questions.  What should I tell my health care provider before I take this medicine?  They need to know if you have any of these conditions:  -kidney disease  -an unusual or allergic reaction to daptomycin, other medicines, foods, dyes, or preservatives  -pregnant or trying to get pregnant  -breast-feeding  How should I use this medicine?  This medicine is for infusion into a vein. It is usually given by a health care professional in a hospital or clinic setting.  If you get this medicine at home, you will be taught how to prepare and give this medicine. Use exactly as directed. Take your medicine at regular intervals. Do not take your medicine more often than directed. Take all of your medicine as directed even if you think you are better. Do not skip doses or stop your medicine early.  It is important that you put your used needles and syringes in a special sharps container. Do not put them in a trash can. If you do not have a sharps container, call your pharmacist or healthcare provider to get one.  Talk to your pediatrician regarding the use of this medicine in children. Special care may be needed.  Overdosage: If you think you have taken too much of this medicine contact a poison control center or emergency room at once.  NOTE: This medicine is only for you. Do not share this medicine with others.  What if I miss a dose?  If you miss a dose, take it as soon as you can. If it is almost time for your next dose, take only that dose. Do not take double or extra doses.  What may interact with this medicine?  -birth control pills  -some antibiotics like tobramycin  This list may not describe all  possible interactions. Give your health care provider a list of all the medicines, herbs, non-prescription drugs, or dietary supplements you use. Also tell them if you smoke, drink alcohol, or use illegal drugs. Some items may interact with your medicine.  What should I watch for while using this medicine?  Your condition will be monitored carefully while you are receiving this medicine.  Do not treat diarrhea with over the counter products. Contact your doctor if you have diarrhea that lasts more than 2 days or if it is severe and watery.  What side effects may I notice from receiving this medicine?  Side effects that you should report to your doctor or health care professional as soon as possible:  -allergic reactions like skin rash, itching or hives, swelling of the face, lips, or tongue  -breathing problems  -fever, infection  -high or low blood pressure  -muscle pain  -numb or tingling pain  -trouble passing urine or change in the amount of urine  -unusually tired or weak  -vomiting  Side effects that usually do not require medical attention (report to your doctor or health care professional if they continue or are bothersome):  -constipation or diarrhea  -trouble sleeping  -headache  -nausea  -stomach upset  This list may not describe all possible side effects. Call your doctor for medical advice about side effects. You may report   side effects to FDA at 1-800-FDA-1088.  Where should I keep my medicine?  Keep out of the reach of children.  If you are using this medicine at home, you will be instructed on how to store this medicine. Throw away any unused medicine after the expiration date on the label.  NOTE: This sheet is a summary. It may not cover all possible information. If you have questions about this medicine, talk to your doctor, pharmacist, or health care provider.      2016, [employer]vier/[employer] Standard. (2013-01-16 07:33:48)

## 2015-06-16 ENCOUNTER — Encounter (HOSPITAL_COMMUNITY): Payer: Self-pay | Admitting: *Deleted

## 2015-06-16 ENCOUNTER — Encounter (HOSPITAL_COMMUNITY)
Admission: RE | Disposition: A | Discharge status: Home / Self Care | Payer: Self-pay | Source: Ambulatory Visit | Attending: Orthopedic Surgery

## 2015-06-16 ENCOUNTER — Ambulatory Visit (HOSPITAL_COMMUNITY): Payer: Medicare Other | Admitting: Anesthesiology

## 2015-06-16 ENCOUNTER — Encounter (HOSPITAL_BASED_OUTPATIENT_CLINIC_OR_DEPARTMENT_OTHER): Admission: RE | Payer: Self-pay | Source: Ambulatory Visit

## 2015-06-16 ENCOUNTER — Encounter (HOSPITAL_COMMUNITY)
Admission: RE | Admit: 2015-06-16 | Discharge: 2015-06-16 | Disposition: A | Discharge status: Home / Self Care | Payer: Medicare Other | Source: Ambulatory Visit | Attending: Orthopedic Surgery | Admitting: Orthopedic Surgery

## 2015-06-16 ENCOUNTER — Encounter (HOSPITAL_COMMUNITY): Payer: Self-pay

## 2015-06-16 ENCOUNTER — Ambulatory Visit (HOSPITAL_COMMUNITY)
Admission: RE | Admit: 2015-06-16 | Discharge: 2015-06-16 | Disposition: A | Discharge status: Home / Self Care | Payer: Medicare Other | Source: Ambulatory Visit | Attending: Orthopedic Surgery | Admitting: Orthopedic Surgery

## 2015-06-16 ENCOUNTER — Ambulatory Visit: Payer: Medicare Other

## 2015-06-16 ENCOUNTER — Ambulatory Visit (HOSPITAL_BASED_OUTPATIENT_CLINIC_OR_DEPARTMENT_OTHER): Admission: RE | Admit: 2015-06-16 | Payer: Medicare Other | Source: Ambulatory Visit | Admitting: Orthopedic Surgery

## 2015-06-16 DIAGNOSIS — F419 Anxiety disorder, unspecified: Secondary | ICD-10-CM | POA: Insufficient documentation

## 2015-06-16 DIAGNOSIS — Z6841 Body Mass Index (BMI) 40.0 and over, adult: Secondary | ICD-10-CM | POA: Insufficient documentation

## 2015-06-16 DIAGNOSIS — K589 Irritable bowel syndrome without diarrhea: Secondary | ICD-10-CM | POA: Insufficient documentation

## 2015-06-16 DIAGNOSIS — G4733 Obstructive sleep apnea (adult) (pediatric): Secondary | ICD-10-CM | POA: Diagnosis not present

## 2015-06-16 DIAGNOSIS — I509 Heart failure, unspecified: Secondary | ICD-10-CM | POA: Diagnosis not present

## 2015-06-16 DIAGNOSIS — Z9049 Acquired absence of other specified parts of digestive tract: Secondary | ICD-10-CM | POA: Diagnosis not present

## 2015-06-16 DIAGNOSIS — E1142 Type 2 diabetes mellitus with diabetic polyneuropathy: Secondary | ICD-10-CM | POA: Diagnosis not present

## 2015-06-16 DIAGNOSIS — Z89021 Acquired absence of right finger(s): Secondary | ICD-10-CM | POA: Diagnosis not present

## 2015-06-16 DIAGNOSIS — Z7901 Long term (current) use of anticoagulants: Secondary | ICD-10-CM | POA: Diagnosis not present

## 2015-06-16 DIAGNOSIS — J45909 Unspecified asthma, uncomplicated: Secondary | ICD-10-CM | POA: Diagnosis not present

## 2015-06-16 DIAGNOSIS — L03012 Cellulitis of left finger: Secondary | ICD-10-CM | POA: Diagnosis not present

## 2015-06-16 DIAGNOSIS — Z8542 Personal history of malignant neoplasm of other parts of uterus: Secondary | ICD-10-CM | POA: Diagnosis not present

## 2015-06-16 DIAGNOSIS — E785 Hyperlipidemia, unspecified: Secondary | ICD-10-CM | POA: Insufficient documentation

## 2015-06-16 DIAGNOSIS — Z794 Long term (current) use of insulin: Secondary | ICD-10-CM | POA: Insufficient documentation

## 2015-06-16 DIAGNOSIS — M109 Gout, unspecified: Secondary | ICD-10-CM | POA: Insufficient documentation

## 2015-06-16 DIAGNOSIS — F329 Major depressive disorder, single episode, unspecified: Secondary | ICD-10-CM | POA: Diagnosis not present

## 2015-06-16 DIAGNOSIS — M1990 Unspecified osteoarthritis, unspecified site: Secondary | ICD-10-CM | POA: Diagnosis not present

## 2015-06-16 DIAGNOSIS — Z79899 Other long term (current) drug therapy: Secondary | ICD-10-CM | POA: Insufficient documentation

## 2015-06-16 DIAGNOSIS — K219 Gastro-esophageal reflux disease without esophagitis: Secondary | ICD-10-CM | POA: Diagnosis not present

## 2015-06-16 DIAGNOSIS — A4902 Methicillin resistant Staphylococcus aureus infection, unspecified site: Secondary | ICD-10-CM | POA: Diagnosis not present

## 2015-06-16 HISTORY — PX: DRESSING CHANGE UNDER ANESTHESIA: SHX5237

## 2015-06-16 HISTORY — PX: INCISION AND DRAINAGE: SHX5863

## 2015-06-16 HISTORY — PX: FINGER SURGERY: SHX640

## 2015-06-16 HISTORY — DX: Irritable bowel syndrome, unspecified: K58.9

## 2015-06-16 LAB — BASIC METABOLIC PANEL
Anion gap: 10 (ref 5–15)
BUN: 9 mg/dL (ref 6–20)
CHLORIDE: 103 mmol/L (ref 101–111)
CO2: 21 mmol/L — ABNORMAL LOW (ref 22–32)
CREATININE: 0.86 mg/dL (ref 0.44–1.00)
Calcium: 9.1 mg/dL (ref 8.9–10.3)
GFR calc non Af Amer: >60 mL/min (ref >60)
Glucose, Bld: 359 mg/dL — ABNORMAL HIGH (ref 65–99)
POTASSIUM: 4.3 mmol/L (ref 3.5–5.1)
SODIUM: 134 mmol/L — AB (ref 135–145)

## 2015-06-16 LAB — GLUCOSE, CAPILLARY
GLUCOSE-CAPILLARY: 340 mg/dL — AB (ref 65–99)
GLUCOSE-CAPILLARY: 266 mg/dL — AB (ref 65–99)
Glucose-Capillary: 313 mg/dL — ABNORMAL HIGH (ref 65–99)
Glucose-Capillary: 307 mg/dL — ABNORMAL HIGH (ref 65–99)

## 2015-06-16 LAB — SURGICAL PCR SCREEN
MRSA, PCR: POSITIVE — AB
Staphylococcus aureus: POSITIVE — AB

## 2015-06-16 LAB — HCG, SERUM, QUALITATIVE: Preg, Serum: NEGATIVE

## 2015-06-16 LAB — HEMOGLOBIN: HEMOGLOBIN: 13.3 g/dL (ref 12.0–15.0)

## 2015-06-16 LAB — PROTIME-INR
INR: 2.53 — ABNORMAL HIGH (ref 0.00–1.49)
Prothrombin Time: 26.9 seconds — ABNORMAL HIGH (ref 11.6–15.2)

## 2015-06-16 SURGERY — INCISION AND DRAINAGE, ABSCESS
Anesthesia: Choice | Site: Finger | Laterality: Left

## 2015-06-16 SURGERY — INCISION AND DRAINAGE
Anesthesia: Monitor Anesthesia Care | Site: Finger | Laterality: Right

## 2015-06-16 MED ORDER — LIDOCAINE HCL 1 % IJ SOLN
INTRAMUSCULAR | Status: DC | PRN
  Administered 2015-06-16: 10 mL

## 2015-06-16 MED ORDER — BACITRACIN-NEOMYCIN-POLYMYXIN 400-5-5000 EX OINT
TOPICAL_OINTMENT | CUTANEOUS | Status: AC
  Filled 2015-06-16: qty 1

## 2015-06-16 MED ORDER — BUPIVACAINE HCL (PF) 0.25 % IJ SOLN
INTRAMUSCULAR | Status: AC
  Filled 2015-06-16: qty 10

## 2015-06-16 MED ORDER — MIDAZOLAM HCL 2 MG/2ML IJ SOLN
0.5000 mg | Freq: Once | INTRAMUSCULAR | Status: DC | PRN
Start: 2015-06-16 — End: 2015-06-16

## 2015-06-16 MED ORDER — MUPIROCIN 2 % EX OINT: 1.0000 "application " | TOPICAL_OINTMENT | Freq: Once | CUTANEOUS | Status: DC

## 2015-06-16 MED ORDER — FENTANYL CITRATE (PF) 100 MCG/2ML IJ SOLN: 25.0000 ug | INTRAMUSCULAR | Status: DC | PRN

## 2015-06-16 MED ORDER — HYDROCODONE-ACETAMINOPHEN 5-325 MG PO TABS: 1.0000 | ORAL_TABLET | Freq: Four times a day (QID) | ORAL | Status: DC | PRN

## 2015-06-16 MED ORDER — LIDOCAINE HCL (PF) 1 % IJ SOLN
INTRAMUSCULAR | Status: AC
  Filled 2015-06-16: qty 30

## 2015-06-16 MED ORDER — BUPIVACAINE HCL 0.25 % IJ SOLN
INTRAMUSCULAR | Status: DC | PRN
  Administered 2015-06-16: 10 mL

## 2015-06-16 MED ORDER — MUPIROCIN 2 % EX OINT
TOPICAL_OINTMENT | CUTANEOUS | Status: AC
  Administered 2015-06-16: 10:00:00
  Filled 2015-06-16: qty 22

## 2015-06-16 MED ORDER — SODIUM CHLORIDE 0.9 % IV SOLN
750.0000 mg | INTRAVENOUS | Status: AC
  Administered 2015-06-16: 750 mg via INTRAVENOUS
  Filled 2015-06-16: qty 15

## 2015-06-16 MED ORDER — INSULIN ASPART 100 UNIT/ML ~~LOC~~ SOLN
5.0000 [IU] | Freq: Once | SUBCUTANEOUS | Status: AC
  Administered 2015-06-16: 5 [IU] via SUBCUTANEOUS

## 2015-06-16 MED ORDER — FENTANYL CITRATE (PF) 250 MCG/5ML IJ SOLN
INTRAMUSCULAR | Status: DC | PRN
  Administered 2015-06-16: 25 ug via INTRAVENOUS

## 2015-06-16 MED ORDER — LACTATED RINGERS IV SOLN
INTRAVENOUS | Status: DC
  Administered 2015-06-16 (×2): via INTRAVENOUS

## 2015-06-16 MED ORDER — GLYCOPYRROLATE 0.2 MG/ML IJ SOLN
INTRAMUSCULAR | Status: DC | PRN
  Administered 2015-06-16: 0.1 mg via INTRAVENOUS

## 2015-06-16 MED ORDER — SODIUM CHLORIDE 0.9 % IR SOLN
Status: DC | PRN
  Administered 2015-06-16: 500 mL

## 2015-06-16 MED ORDER — SODIUM CHLORIDE 0.9 % IR SOLN
Status: DC | PRN
  Administered 2015-06-16: 1000 mL

## 2015-06-16 MED ORDER — GLYCOPYRROLATE 0.2 MG/ML IJ SOLN
INTRAMUSCULAR | Status: AC
  Filled 2015-06-16: qty 1

## 2015-06-16 MED ORDER — PROPOFOL 10 MG/ML IV BOLUS
INTRAVENOUS | Status: DC | PRN
  Administered 2015-06-16: 20 mg via INTRAVENOUS

## 2015-06-16 MED ORDER — ONDANSETRON HCL 4 MG/2ML IJ SOLN
INTRAMUSCULAR | Status: DC | PRN
  Administered 2015-06-16: 4 mg via INTRAVENOUS

## 2015-06-16 MED ORDER — ONDANSETRON HCL 4 MG/2ML IJ SOLN
INTRAMUSCULAR | Status: AC
Start: 2015-06-16 — End: 2015-06-16
  Filled 2015-06-16: qty 2

## 2015-06-16 MED ORDER — FENTANYL CITRATE (PF) 250 MCG/5ML IJ SOLN
INTRAMUSCULAR | Status: AC
  Filled 2015-06-16: qty 5

## 2015-06-16 MED ORDER — SODIUM CHLORIDE 0.9 % IV SOLN
Freq: Once | INTRAVENOUS | Status: AC
  Administered 2015-06-16: 250 mL via INTRAVENOUS

## 2015-06-16 MED ORDER — PROMETHAZINE HCL 25 MG/ML IJ SOLN: 6.2500 mg | INTRAMUSCULAR | Status: DC | PRN

## 2015-06-16 MED ORDER — MEPERIDINE HCL 25 MG/ML IJ SOLN: 6.2500 mg | INTRAMUSCULAR | Status: DC | PRN

## 2015-06-16 MED ORDER — SODIUM CHLORIDE 0.9 % IJ SOLN: 10.0000 mL | INTRAMUSCULAR | Status: DC | PRN

## 2015-06-16 MED ORDER — INSULIN ASPART 100 UNIT/ML ~~LOC~~ SOLN
SUBCUTANEOUS | Status: AC
  Filled 2015-06-16: qty 1

## 2015-06-16 MED ORDER — CHLORHEXIDINE GLUCONATE 4 % EX LIQD: 60.0000 mL | Freq: Once | CUTANEOUS | Status: DC

## 2015-06-16 MED ORDER — LIDOCAINE HCL (CARDIAC) 20 MG/ML IV SOLN
INTRAVENOUS | Status: AC
  Filled 2015-06-16: qty 5

## 2015-06-16 MED ORDER — LIDOCAINE HCL (CARDIAC) 20 MG/ML IV SOLN
INTRAVENOUS | Status: DC | PRN
  Administered 2015-06-16: 20 mg via INTRATRACHEAL

## 2015-06-16 MED ORDER — PROPOFOL 10 MG/ML IV BOLUS
INTRAVENOUS | Status: AC
  Filled 2015-06-16: qty 20

## 2015-06-16 MED ORDER — INSULIN NPH (HUMAN) (ISOPHANE) 100 UNIT/ML ~~LOC~~ SUSP
5.0000 [IU] | Freq: Once | SUBCUTANEOUS | Status: DC
  Filled 2015-06-16: qty 10

## 2015-06-16 SURGICAL SUPPLY — 37 items
BANDAGE ELASTIC 4 VELCRO ST LF (GAUZE/BANDAGES/DRESSINGS) IMPLANT
BNDG CONFORM 2 STRL LF (GAUZE/BANDAGES/DRESSINGS) ×4 IMPLANT
BNDG ESMARK 4X9 LF (GAUZE/BANDAGES/DRESSINGS) IMPLANT
BNDG GAUZE ELAST 4 BULKY (GAUZE/BANDAGES/DRESSINGS) IMPLANT
CORDS BIPOLAR (ELECTRODE) ×4 IMPLANT
CUFF TOURNIQUET SINGLE 18IN (TOURNIQUET CUFF) IMPLANT
CUFF TOURNIQUET SINGLE 24IN (TOURNIQUET CUFF) IMPLANT
DRAIN PENROSE 1/2X12 LTX STRL (WOUND CARE) ×4 IMPLANT
DRSG ADAPTIC 3X8 NADH LF (GAUZE/BANDAGES/DRESSINGS) IMPLANT
DRSG EMULSION OIL 3X3 NADH (GAUZE/BANDAGES/DRESSINGS) ×4 IMPLANT
DRSG PAD ABDOMINAL 8X10 ST (GAUZE/BANDAGES/DRESSINGS) IMPLANT
GAUZE SPONGE 2X2 8PLY STRL LF (GAUZE/BANDAGES/DRESSINGS) ×2 IMPLANT
GAUZE SPONGE 4X4 12PLY STRL (GAUZE/BANDAGES/DRESSINGS) IMPLANT
GAUZE XEROFORM 1X8 LF (GAUZE/BANDAGES/DRESSINGS) IMPLANT
GLOVE BIO SURGEON STRL SZ7 (GLOVE) ×4 IMPLANT
GLOVE BIOGEL PI IND STRL 7.0 (GLOVE) ×6 IMPLANT
GLOVE BIOGEL PI INDICATOR 7.0 (GLOVE) ×6
HANDPIECE INTERPULSE COAX TIP (DISPOSABLE)
KIT BASIN OR (CUSTOM PROCEDURE TRAY) ×4 IMPLANT
KIT ROOM TURNOVER OR (KITS) ×4 IMPLANT
MANIFOLD NEPTUNE II (INSTRUMENTS) IMPLANT
NS IRRIG 1000ML POUR BTL (IV SOLUTION) ×4 IMPLANT
PACK ORTHO EXTREMITY (CUSTOM PROCEDURE TRAY) ×4 IMPLANT
PAD ARMBOARD 7.5X6 YLW CONV (MISCELLANEOUS) ×4 IMPLANT
SET HNDPC FAN SPRY TIP SCT (DISPOSABLE) IMPLANT
SPONGE GAUZE 2X2 STER 10/PKG (GAUZE/BANDAGES/DRESSINGS) ×2
SPONGE LAP 18X18 X RAY DECT (DISPOSABLE) IMPLANT
SPONGE LAP 4X18 X RAY DECT (DISPOSABLE) IMPLANT
SWAB COLLECTION DEVICE MRSA (MISCELLANEOUS) ×4 IMPLANT
TOWEL OR 17X24 6PK STRL BLUE (TOWEL DISPOSABLE) IMPLANT
TOWEL OR 17X26 10 PK STRL BLUE (TOWEL DISPOSABLE) ×4 IMPLANT
TUBE ANAEROBIC SPECIMEN COL (MISCELLANEOUS) ×4 IMPLANT
TUBE CONNECTING 12'X1/4 (SUCTIONS) ×1
TUBE CONNECTING 12X1/4 (SUCTIONS) ×3 IMPLANT
UNDERPAD 30X30 INCONTINENT (UNDERPADS AND DIAPERS) IMPLANT
WATER STERILE IRR 1000ML POUR (IV SOLUTION) IMPLANT
YANKAUER SUCT BULB TIP NO VENT (SUCTIONS) ×4 IMPLANT

## 2015-06-16 NOTE — Anesthesia Postprocedure Evaluation (Signed)
Anesthesia Post Note  Patient: [name]  Procedure(s) Performed: Procedure(s) (LRB): IRRIGATION AND DEBRIDEMENT OF LEFT RING FINGER AND LEFT SMALL FINGER. (Left) DRESSING CHANGE UNDER ANESTHESIA TO RIGHT THUMB AND RIGHT FIRST FINGER. (Right)  Patient location during evaluation: PACU Anesthesia Type: General Level of consciousness: awake and alert, patient cooperative and oriented Pain management: pain level controlled Vital Signs Assessment: post-procedure vital signs reviewed and stable Respiratory status: spontaneous breathing, nonlabored ventilation and respiratory function stable Cardiovascular status: blood pressure returned to baseline and stable Postop Assessment: no signs of nausea or vomiting Anesthetic complications: no    Last Vitals:  Filed Vitals:   06/16/15 1345 06/16/15 1405  BP: 103/54 102/59  Pulse: 61 67  Temp:  36.6 C  Resp: 16 13    Last Pain:  Filed Vitals:   06/16/15 1410  PainSc: 0-No pain                 Baltazar Pekala,E. Vamsi Apfel

## 2015-06-16 NOTE — Anesthesia Preprocedure Evaluation (Addendum)
Anesthesia Evaluation  Patient identified by MRN, date of birth, ID band Patient awake    Reviewed: Allergy & Precautions, NPO status , Patient's Chart, lab work & pertinent test results  History of Anesthesia Complications (+) PONV and history of anesthetic complications  Airway Mallampati: Trach  TM Distance: >3 FB Neck ROM: Limited    Dental  (+) Dental Advisory Given   Pulmonary asthma , sleep apnea (trach) , COPD,  COPD inhaler,  Trach: BMI 65   breath sounds clear to auscultation       Cardiovascular hypertension, Pt. on medications and Pt. on home beta blockers (-) angina+CHF   Rhythm:Regular Rate:Normal  '10 ECHO: EF 50-55%, valves OK   Neuro/Psych Anxiety Depression  Neuromuscular disease (polyneuropathy)    GI/Hepatic Neg liver ROS, GERD  Medicated and Controlled,H/o C Diff   Endo/Other  diabetes (glu 313), Insulin DependentMorbid obesity  Renal/GU negative Renal ROS     Musculoskeletal  (+) Arthritis , Osteoarthritis,    Abdominal (+) + obese,   Peds  Hematology  (+) Blood dyscrasia (coumadin), ,   Anesthesia Other Findings   Reproductive/Obstetrics                           Anesthesia Physical Anesthesia Plan  ASA: III  Anesthesia Plan: MAC   Post-op Pain Management:    Induction:   Airway Management Planned: Tracheostomy  Additional Equipment:   Intra-op Plan:   Post-operative Plan:   Informed Consent: I have reviewed the patients History and Physical, chart, labs and discussed the procedure including the risks, benefits and alternatives for the proposed anesthesia with the patient or authorized representative who has indicated his/her understanding and acceptance.   Dental advisory given  Plan Discussed with: CRNA and Surgeon  Anesthesia Plan Comments: (Plan routine monitors, MAC with local by surgeon)        Anesthesia Quick Evaluation

## 2015-06-16 NOTE — Transfer of Care (Signed)
Immediate Anesthesia Transfer of Care Note  Patient: [name]  Procedure(s) Performed: Procedure(s): IRRIGATION AND DEBRIDEMENT OF LEFT RING FINGER AND LEFT SMALL FINGER. (Left) DRESSING CHANGE UNDER ANESTHESIA TO RIGHT THUMB AND RIGHT FIRST FINGER. (Right)  Patient Location: PACU  Anesthesia Type:MAC  Level of Consciousness: awake, alert  and oriented  Airway & Oxygen Therapy: Mask to Trach, vss  Post-op Assessment: Report given to RN, Post -op Vital signs reviewed and stable and Patient moving all extremities X 4  Post vital signs: Reviewed and stable  Last Vitals:  Filed Vitals:   06/16/15 0924  BP: 129/60  Pulse: 67  Temp: 36.7 C  Resp: 18    Complications: No apparent anesthesia complications

## 2015-06-16 NOTE — Brief Op Note (Signed)
06/16/2015  1:08 PM  PATIENT:  [name]  55 y.o. female  PRE-OPERATIVE DIAGNOSIS:  INFECTION LEFT RING AND SMALL FINGER  POST-OPERATIVE DIAGNOSIS:  INFECTION LEFT RING AND SMALL FINGER  PROCEDURE:  Procedure(s): IRRIGATION AND DEBRIDEMENT OF LEFT RING FINGER AND LEFT SMALL FINGER. (Left) DRESSING CHANGE UNDER ANESTHESIA TO RIGHT THUMB AND RIGHT FIRST FINGER. (Right)  SURGEON:  Surgeon(s) and Role:    * Daryll Brod, MD - Primary  PHYSICIAN ASSISTANT:   ASSISTANTS: none   ANESTHESIA:   local  EBL:  Total I/O In: 600 [I.V.:600] Out: -   BLOOD ADMINISTERED:none  DRAINS: none   LOCAL MEDICATIONS USED:  BUPIVICAINE  and XYLOCAINE   SPECIMEN:  Source of Specimen:  Culture  DISPOSITION OF SPECIMEN:  PATHOLOGY  COUNTS:  YES  TOURNIQUET:  * No tourniquets in log *  DICTATION: .Other Dictation: Dictation Number [id number]  PLAN OF CARE: Discharge to home after PACU  PATIENT DISPOSITION:  PACU - hemodynamically stable.
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
## 2015-06-16 NOTE — Progress Notes (Signed)
Report given to steve shaw rn as caregiver 

## 2015-06-16 NOTE — Progress Notes (Signed)
Dr Glennon Mac called and informed of cbg of 340. New orders noted. Also informed her of nsl noted in left ac area placed at short stay at [facility] long short stay for her treatment for uterine cancer. Dr Glennon Mac said to let Dr Fredna Dow know when he gets here and we can take it out if he needs [name]  To.
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
## 2015-06-16 NOTE — Discharge Instructions (Signed)

## 2015-06-16 NOTE — Op Note (Signed)
Dictation Number [id number]

## 2015-06-17 ENCOUNTER — Encounter (HOSPITAL_COMMUNITY)
Admission: RE | Admit: 2015-06-17 | Discharge: 2015-06-17 | Disposition: A | Discharge status: Home / Self Care | Payer: Medicare Other | Source: Ambulatory Visit | Attending: Orthopedic Surgery | Admitting: Orthopedic Surgery

## 2015-06-17 ENCOUNTER — Encounter (HOSPITAL_COMMUNITY): Payer: Self-pay

## 2015-06-17 ENCOUNTER — Ambulatory Visit
Admission: RE | Admit: 2015-06-17 | Discharge: 2015-06-17 | Disposition: A | Discharge status: Home / Self Care | Payer: Medicare Other | Source: Ambulatory Visit | Attending: Radiation Oncology | Admitting: Radiation Oncology

## 2015-06-17 DIAGNOSIS — A4902 Methicillin resistant Staphylococcus aureus infection, unspecified site: Secondary | ICD-10-CM | POA: Diagnosis not present

## 2015-06-17 DIAGNOSIS — Z510 Encounter for antineoplastic radiation therapy: Secondary | ICD-10-CM | POA: Diagnosis not present

## 2015-06-17 MED ORDER — SODIUM CHLORIDE 0.9 % IV SOLN
250.0000 mL | INTRAVENOUS | Status: DC
  Administered 2015-06-17: 250 mL via INTRAVENOUS

## 2015-06-17 MED ORDER — SODIUM CHLORIDE 0.9 % IV SOLN
750.0000 mg | INTRAVENOUS | Status: DC
  Administered 2015-06-17: 750 mg via INTRAVENOUS
  Filled 2015-06-17 (×2): qty 15

## 2015-06-17 MED ORDER — SODIUM CHLORIDE 0.9 % IJ SOLN
10.0000 mL | INTRAMUSCULAR | Status: DC | PRN
  Administered 2015-06-17: 10 mL
  Filled 2015-06-17: qty 10

## 2015-06-17 NOTE — Progress Notes (Signed)
IV cath kinked. Repositioned several times during treatment so could continue to infuse. IV dced at end of treatment today

## 2015-06-18 ENCOUNTER — Encounter (HOSPITAL_COMMUNITY)
Admission: RE | Admit: 2015-06-18 | Discharge: 2015-06-18 | Disposition: A | Discharge status: Home / Self Care | Payer: Medicare Other | Source: Ambulatory Visit | Attending: Orthopedic Surgery | Admitting: Orthopedic Surgery

## 2015-06-18 ENCOUNTER — Emergency Department (HOSPITAL_COMMUNITY)
Admission: EM | Admit: 2015-06-18 | Discharge: 2015-06-18 | Discharge status: Absent without leave | Payer: Medicare Other

## 2015-06-18 DIAGNOSIS — A4902 Methicillin resistant Staphylococcus aureus infection, unspecified site: Secondary | ICD-10-CM | POA: Diagnosis not present

## 2015-06-18 MED ORDER — SODIUM CHLORIDE 0.9 % IV SOLN
750.0000 mg | INTRAVENOUS | Status: DC
  Filled 2015-06-18 (×2): qty 15

## 2015-06-19 ENCOUNTER — Encounter (HOSPITAL_COMMUNITY)
Admission: RE | Admit: 2015-06-19 | Discharge: 2015-06-19 | Disposition: A | Discharge status: Home / Self Care | Payer: Medicare Other | Source: Ambulatory Visit | Attending: Orthopedic Surgery | Admitting: Orthopedic Surgery

## 2015-06-19 DIAGNOSIS — A4902 Methicillin resistant Staphylococcus aureus infection, unspecified site: Secondary | ICD-10-CM | POA: Diagnosis not present

## 2015-06-19 MED ORDER — SODIUM CHLORIDE 0.9 % IV SOLN
750.0000 mg | INTRAVENOUS | Status: DC
  Filled 2015-06-19 (×2): qty 15

## 2015-06-19 MED ORDER — SODIUM CHLORIDE 0.9 % IV SOLN
750.0000 mg | INTRAVENOUS | Status: DC
  Administered 2015-06-19: 750 mg via INTRAVENOUS
  Filled 2015-06-19 (×2): qty 15

## 2015-06-19 NOTE — Discharge Instructions (Signed)
Patient Discharge   SAPPHIRE BING / [id number] DOB: 10/15/1960   Admitted 06/18/2015 Discharged: 06/19/2015   Scheduled Meds: Continuous Infusions: PRN Meds:  Personal Items   Please collect all clothing which belongs to you from your nurse. Please collect any valuables you stored during your stay from the front desk, and please remember all of your personal items, such as dentures, canes, and eyeglasses.

## 2015-06-20 ENCOUNTER — Encounter (HOSPITAL_COMMUNITY): Payer: Medicare Other

## 2015-06-21 ENCOUNTER — Encounter (HOSPITAL_COMMUNITY)
Admission: RE | Admit: 2015-06-21 | Discharge: 2015-06-21 | Disposition: A | Discharge status: Home / Self Care | Payer: Medicare Other | Source: Ambulatory Visit | Attending: Orthopedic Surgery | Admitting: Orthopedic Surgery

## 2015-06-21 ENCOUNTER — Ambulatory Visit
Admission: RE | Admit: 2015-06-21 | Discharge: 2015-06-21 | Disposition: A | Discharge status: Home / Self Care | Payer: Medicare Other | Source: Ambulatory Visit | Attending: Radiation Oncology | Admitting: Radiation Oncology

## 2015-06-21 DIAGNOSIS — A4902 Methicillin resistant Staphylococcus aureus infection, unspecified site: Secondary | ICD-10-CM | POA: Diagnosis not present

## 2015-06-21 DIAGNOSIS — Z510 Encounter for antineoplastic radiation therapy: Secondary | ICD-10-CM | POA: Diagnosis not present

## 2015-06-21 LAB — ANAEROBIC CULTURE

## 2015-06-21 MED ORDER — SODIUM CHLORIDE 0.9 % IV SOLN
250.0000 mL | INTRAVENOUS | Status: DC
  Administered 2015-06-21: 250 mL via INTRAVENOUS

## 2015-06-21 MED ORDER — SODIUM CHLORIDE 0.9 % IJ SOLN
10.0000 mL | INTRAMUSCULAR | Status: DC | PRN
  Administered 2015-06-21: 10 mL
  Filled 2015-06-21: qty 10

## 2015-06-21 MED ORDER — SODIUM CHLORIDE 0.9 % IV SOLN
750.0000 mg | Freq: Once | INTRAVENOUS | Status: AC
  Administered 2015-06-21: 750 mg via INTRAVENOUS
  Filled 2015-06-21: qty 15

## 2015-06-22 ENCOUNTER — Ambulatory Visit
Admission: RE | Admit: 2015-06-22 | Discharge: 2015-06-22 | Disposition: A | Discharge status: Home / Self Care | Payer: Medicare Other | Source: Ambulatory Visit | Attending: Radiation Oncology | Admitting: Radiation Oncology

## 2015-06-22 ENCOUNTER — Encounter (HOSPITAL_COMMUNITY)
Admission: RE | Admit: 2015-06-22 | Discharge: 2015-06-22 | Disposition: A | Discharge status: Home / Self Care | Payer: Medicare Other | Source: Ambulatory Visit | Attending: Radiation Oncology | Admitting: Radiation Oncology

## 2015-06-22 ENCOUNTER — Other Ambulatory Visit (HOSPITAL_COMMUNITY): Payer: Self-pay | Admitting: Orthopedic Surgery

## 2015-06-22 ENCOUNTER — Encounter: Payer: Self-pay | Admitting: Radiation Oncology

## 2015-06-22 ENCOUNTER — Encounter (HOSPITAL_COMMUNITY): Payer: Self-pay

## 2015-06-22 VITALS — BP 119/67 | HR 71 | Temp 98.2°F | Resp 20 | Wt 388.3 lb

## 2015-06-22 DIAGNOSIS — C541 Malignant neoplasm of endometrium: Secondary | ICD-10-CM

## 2015-06-22 DIAGNOSIS — A4902 Methicillin resistant Staphylococcus aureus infection, unspecified site: Secondary | ICD-10-CM | POA: Diagnosis not present

## 2015-06-22 DIAGNOSIS — Z510 Encounter for antineoplastic radiation therapy: Secondary | ICD-10-CM | POA: Diagnosis not present

## 2015-06-22 LAB — TISSUE CULTURE

## 2015-06-22 MED ORDER — SODIUM CHLORIDE 0.9 % IJ SOLN: 10.0000 mL | INTRAMUSCULAR | Status: DC | PRN

## 2015-06-22 MED ORDER — SODIUM CHLORIDE 0.9 % IV SOLN
750.0000 mg | INTRAVENOUS | Status: DC
  Administered 2015-06-22: 750 mg via INTRAVENOUS
  Filled 2015-06-22 (×3): qty 15

## 2015-06-22 MED ORDER — SODIUM CHLORIDE 0.9 % IV SOLN: 250.0000 mL | INTRAVENOUS | Status: DC

## 2015-06-22 MED ORDER — SODIUM CHLORIDE 0.9 % IV SOLN
750.0000 mg | INTRAVENOUS | Status: DC
  Filled 2015-06-22 (×2): qty 15

## 2015-06-22 MED ORDER — PROMETHAZINE HCL 25 MG PO TABS: 25.0000 mg | ORAL_TABLET | ORAL | Status: DC | PRN

## 2015-06-22 MED ORDER — SODIUM CHLORIDE 0.9 % IV SOLN
Freq: Every day | INTRAVENOUS | Status: DC
  Administered 2015-06-22: 09:00:00 via INTRAVENOUS

## 2015-06-22 MED ORDER — SODIUM CHLORIDE 0.9 % IV SOLN: 4.0000 mg/kg | INTRAVENOUS | Status: DC

## 2015-06-22 NOTE — Progress Notes (Signed)
Weekly Management Note:  Outpatient    ICD-9-CM ICD-10-CM   1. Endometrial cancer (HCC) 182.0 C54.1 promethazine (PHENERGAN) 25 MG tablet    Current Dose: 19.8 Gy  Projected Dose: 45-50.4 Gy to pelvis  +/- brachy boost  Narrative:  The patient presents for routine under treatment assessment.  CBCT/MVCT images/Port film x-rays were reviewed.  The chart was checked. She sees Dr Cordelia Pen next week to consider brachy boost. She is developing irritation over labia  Uses aloe vesta ointment  Physical Findings:  Wt Readings from Last 3 Encounters:  06/22/15 388 lb 4.8 oz (176.132 kg)  06/22/15 407 lb (184.614 kg)  06/21/15 407 lb (184.614 kg)   Filed Vitals:   06/22/15 1059  BP: 119/67  Pulse: 71  Temp: 98.2 F (36.8 C)  Resp: 20    NAD, in WC, bandages over several fingers.  Perineum - erythema/irritation of skin, though not obviously more so than at consult   CBC    Component Value Date/Time   WBC 8.1 06/01/2015 1128   WBC 5.3 01/10/2015 1352   WBC 7.7 02/14/2010 1446   RBC 4.60 06/01/2015 1128   RBC 3.93 01/10/2015 1352   RBC 4.20 02/14/2010 1446   HGB 13.3 06/16/2015 0948   HGB 13.6 06/01/2015 1128   HGB 13.3 02/14/2010 1446   HCT 41.6 06/01/2015 1128   HCT 36.4 01/10/2015 1352   HCT 38.4 02/14/2010 1446   PLT 158 06/01/2015 1128   PLT 108* 01/10/2015 1352   PLT 189 02/14/2010 1446   MCV 90.4 06/01/2015 1128   MCV 92.6 01/10/2015 1352   MCV 91 02/14/2010 1446   MCH 29.7 06/01/2015 1128   MCH 31.0 01/10/2015 1352   MCH 31.6 02/14/2010 1446   MCHC 32.8 06/01/2015 1128   MCHC 33.5 01/10/2015 1352   MCHC 34.6 02/14/2010 1446   RDW 15.0* 06/01/2015 1128   RDW 14.2 01/10/2015 1352   RDW 13.5 02/14/2010 1446   LYMPHSABS 2.4 06/01/2015 1128   LYMPHSABS 1.8 11/08/2014 1016   MONOABS 1.1* 06/01/2015 1128   MONOABS 0.8 11/08/2014 1016   EOSABS 0.2 06/01/2015 1128   EOSABS 0.1 11/08/2014 1016   BASOSABS 0.1 06/01/2015 1128   BASOSABS 0.0 11/08/2014 1016      CMP     Component Value Date/Time   NA 134* 06/16/2015 0948   NA 135* 06/01/2015 1128   NA 137 02/14/2010 1446   K 4.3 06/16/2015 0948   K 4.4 06/01/2015 1128   K 4.1 02/14/2010 1446   CL 103 06/16/2015 0948   CL 96* 02/14/2010 1446   CO2 21* 06/16/2015 0948   CO2 21* 06/01/2015 1128   CO2 26 02/14/2010 1446   GLUCOSE 359* 06/16/2015 0948   GLUCOSE 291* 06/01/2015 1128   GLUCOSE 171* 02/14/2010 1446   BUN 9 06/16/2015 0948   BUN 14.8 06/01/2015 1128   BUN 29* 02/14/2010 1446   CREATININE 0.86 06/16/2015 0948   CREATININE 1.2* 06/01/2015 1128   CREATININE 1.0 02/14/2010 1446   CALCIUM 9.1 06/16/2015 0948   CALCIUM 9.4 06/01/2015 1128   CALCIUM 9.0 02/14/2010 1446   PROT 8.1 06/01/2015 1128   PROT 7.0 01/17/2015 1515   PROT 7.7 02/14/2010 1446   ALBUMIN 3.5 06/01/2015 1128   ALBUMIN 3.4* 01/17/2015 1515   ALBUMIN 3.7 02/14/2010 1446   AST 16 06/01/2015 1128   AST 26 01/17/2015 1515   AST 39* 02/14/2010 1446   ALT 17 06/01/2015 1128   ALT 25 01/17/2015 1515  ALT 44 02/14/2010 1446   ALKPHOS 66 06/01/2015 1128   ALKPHOS 45 01/17/2015 1515   ALKPHOS 42 02/14/2010 1446   BILITOT 0.57 06/01/2015 1128   BILITOT 0.7 01/17/2015 1515   BILITOT 0.40 02/14/2010 1446   GFRNONAA >60 06/16/2015 0948   GFRAA >60 06/16/2015 0948     Impression:  The patient is tolerating radiotherapy.   Plan:  I revised her plan today, pulling up inferior borders of fields slightly to decrease skin irritation; but - this will be an inevitable side effect of her treatment. Encouraged showers with  warm soapy water frequently, blot dry, continue to use aloe vesta -----------------------------------  Eppie Gibson, MD

## 2015-06-22 NOTE — Progress Notes (Signed)
Weekly rad tx pelvis, 11/25  Completed, no  Nausea, no bleeding,  No discharge, c/o pain 8/10 scale on her rectum, stated  Skin irritation  Near her vagina, skin tags irritated on lip of labia , using aloe vesta cream there, had percocet 1 tab this am after 7am, has tried A& D, and desitin to the area of treatment,  Fingers on right and le=ft hand bandaged, going to see Dr.Kuzma to check that,had surgery on those last week Fair appetite, fatigued, mouth stays dry stated BP 119/67 mmHg  Pulse 71  Temp(Src) 98.2 F (36.8 C) (Oral)  Resp 20  Wt 388 lb 4.8 oz (176.132 kg)  Wt Readings from Last 3 Encounters:  06/22/15 388 lb 4.8 oz (176.132 kg)  06/22/15 407 lb (184.614 kg)  06/21/15 407 lb (184.614 kg)

## 2015-06-23 ENCOUNTER — Encounter (HOSPITAL_COMMUNITY)
Admission: RE | Admit: 2015-06-23 | Discharge: 2015-06-23 | Disposition: A | Discharge status: Home / Self Care | Payer: Medicare Other | Source: Ambulatory Visit | Attending: Radiation Oncology | Admitting: Radiation Oncology

## 2015-06-23 ENCOUNTER — Encounter (HOSPITAL_COMMUNITY): Payer: Self-pay

## 2015-06-23 ENCOUNTER — Ambulatory Visit
Admission: RE | Admit: 2015-06-23 | Discharge: 2015-06-23 | Disposition: A | Discharge status: Home / Self Care | Payer: Medicare Other | Source: Ambulatory Visit | Attending: Radiation Oncology | Admitting: Radiation Oncology

## 2015-06-23 ENCOUNTER — Encounter (HOSPITAL_COMMUNITY)
Admission: RE | Admit: 2015-06-23 | Discharge: 2015-06-23 | Disposition: A | Discharge status: Home / Self Care | Payer: Medicare Other | Source: Ambulatory Visit | Attending: Orthopedic Surgery | Admitting: Orthopedic Surgery

## 2015-06-23 DIAGNOSIS — Z510 Encounter for antineoplastic radiation therapy: Secondary | ICD-10-CM | POA: Diagnosis not present

## 2015-06-23 DIAGNOSIS — A4902 Methicillin resistant Staphylococcus aureus infection, unspecified site: Secondary | ICD-10-CM | POA: Diagnosis not present

## 2015-06-23 MED ORDER — SODIUM CHLORIDE 0.9 % IV SOLN
750.0000 mg | INTRAVENOUS | Status: DC
  Administered 2015-06-23: 750 mg via INTRAVENOUS
  Filled 2015-06-23 (×2): qty 15

## 2015-06-23 MED ORDER — SODIUM CHLORIDE 0.9 % IJ SOLN
10.0000 mL | INTRAMUSCULAR | Status: DC | PRN
  Administered 2015-06-23: 10 mL via INTRAVENOUS
  Filled 2015-06-23: qty 10

## 2015-06-23 MED ORDER — SODIUM CHLORIDE 0.9 % IV SOLN
Freq: Every day | INTRAVENOUS | Status: DC
  Administered 2015-06-23: 09:00:00 via INTRAVENOUS

## 2015-06-23 NOTE — Discharge Instructions (Signed)
Daptomycin injection  What is this medicine?  DAPTOMYCIN (DAP toe MYE sin) is a lipopeptide antibiotic. It is used to treat certain kinds of bacterial infections. It will not work for colds, flu, or other viral infections.  This medicine may be used for other purposes; ask your health care provider or pharmacist if you have questions.  What should I tell my health care provider before I take this medicine?  They need to know if you have any of these conditions:  -kidney disease  -an unusual or allergic reaction to daptomycin, other medicines, foods, dyes, or preservatives  -pregnant or trying to get pregnant  -breast-feeding  How should I use this medicine?  This medicine is for infusion into a vein. It is usually given by a health care professional in a hospital or clinic setting.  If you get this medicine at home, you will be taught how to prepare and give this medicine. Use exactly as directed. Take your medicine at regular intervals. Do not take your medicine more often than directed. Take all of your medicine as directed even if you think you are better. Do not skip doses or stop your medicine early.  It is important that you put your used needles and syringes in a special sharps container. Do not put them in a trash can. If you do not have a sharps container, call your pharmacist or healthcare provider to get one.  Talk to your pediatrician regarding the use of this medicine in children. Special care may be needed.  Overdosage: If you think you have taken too much of this medicine contact a poison control center or emergency room at once.  NOTE: This medicine is only for you. Do not share this medicine with others.  What if I miss a dose?  If you miss a dose, take it as soon as you can. If it is almost time for your next dose, take only that dose. Do not take double or extra doses.  What may interact with this medicine?  -birth control pills  -some antibiotics like tobramycin  This list may not describe all  possible interactions. Give your health care provider a list of all the medicines, herbs, non-prescription drugs, or dietary supplements you use. Also tell them if you smoke, drink alcohol, or use illegal drugs. Some items may interact with your medicine.  What should I watch for while using this medicine?  Your condition will be monitored carefully while you are receiving this medicine.  Do not treat diarrhea with over the counter products. Contact your doctor if you have diarrhea that lasts more than 2 days or if it is severe and watery.  What side effects may I notice from receiving this medicine?  Side effects that you should report to your doctor or health care professional as soon as possible:  -allergic reactions like skin rash, itching or hives, swelling of the face, lips, or tongue  -breathing problems  -fever, infection  -high or low blood pressure  -muscle pain  -numb or tingling pain  -trouble passing urine or change in the amount of urine  -unusually tired or weak  -vomiting  Side effects that usually do not require medical attention (report to your doctor or health care professional if they continue or are bothersome):  -constipation or diarrhea  -trouble sleeping  -headache  -nausea  -stomach upset  This list may not describe all possible side effects. Call your doctor for medical advice about side effects. You may report   side effects to FDA at 1-800-FDA-1088.  Where should I keep my medicine?  Keep out of the reach of children.  If you are using this medicine at home, you will be instructed on how to store this medicine. Throw away any unused medicine after the expiration date on the label.  NOTE: This sheet is a summary. It may not cover all possible information. If you have questions about this medicine, talk to your doctor, pharmacist, or health care provider.      2016, [employer]vier/[employer] Standard. (2013-01-16 07:33:48)

## 2015-06-24 ENCOUNTER — Encounter (HOSPITAL_COMMUNITY): Payer: Self-pay

## 2015-06-24 ENCOUNTER — Encounter (HOSPITAL_COMMUNITY)
Admission: RE | Admit: 2015-06-24 | Discharge: 2015-06-24 | Disposition: A | Discharge status: Home / Self Care | Payer: Medicare Other | Source: Ambulatory Visit | Attending: Orthopedic Surgery | Admitting: Orthopedic Surgery

## 2015-06-24 ENCOUNTER — Ambulatory Visit
Admission: RE | Admit: 2015-06-24 | Discharge: 2015-06-24 | Disposition: A | Discharge status: Home / Self Care | Payer: Medicare Other | Source: Ambulatory Visit | Attending: Radiation Oncology | Admitting: Radiation Oncology

## 2015-06-24 DIAGNOSIS — Z510 Encounter for antineoplastic radiation therapy: Secondary | ICD-10-CM | POA: Diagnosis not present

## 2015-06-24 DIAGNOSIS — A4902 Methicillin resistant Staphylococcus aureus infection, unspecified site: Secondary | ICD-10-CM | POA: Diagnosis not present

## 2015-06-24 MED ORDER — SODIUM CHLORIDE 0.9 % IV SOLN
Freq: Every day | INTRAVENOUS | Status: DC
  Administered 2015-06-24: 08:00:00 via INTRAVENOUS

## 2015-06-24 MED ORDER — SODIUM CHLORIDE 0.9 % IV SOLN
750.0000 mg | INTRAVENOUS | Status: DC
  Administered 2015-06-24: 750 mg via INTRAVENOUS
  Filled 2015-06-24 (×2): qty 15

## 2015-06-24 MED ORDER — SODIUM CHLORIDE 0.9 % IJ SOLN
10.0000 mL | INTRAMUSCULAR | Status: DC | PRN
  Administered 2015-06-24: 10 mL via INTRAVENOUS
  Filled 2015-06-24: qty 10

## 2015-06-24 NOTE — Discharge Instructions (Signed)
Daptomycin injection  What is this medicine?  DAPTOMYCIN (DAP toe MYE sin) is a lipopeptide antibiotic. It is used to treat certain kinds of bacterial infections. It will not work for colds, flu, or other viral infections.  This medicine may be used for other purposes; ask your health care provider or pharmacist if you have questions.  What should I tell my health care provider before I take this medicine?  They need to know if you have any of these conditions:  -kidney disease  -an unusual or allergic reaction to daptomycin, other medicines, foods, dyes, or preservatives  -pregnant or trying to get pregnant  -breast-feeding  How should I use this medicine?  This medicine is for infusion into a vein. It is usually given by a health care professional in a hospital or clinic setting.  If you get this medicine at home, you will be taught how to prepare and give this medicine. Use exactly as directed. Take your medicine at regular intervals. Do not take your medicine more often than directed. Take all of your medicine as directed even if you think you are better. Do not skip doses or stop your medicine early.  It is important that you put your used needles and syringes in a special sharps container. Do not put them in a trash can. If you do not have a sharps container, call your pharmacist or healthcare provider to get one.  Talk to your pediatrician regarding the use of this medicine in children. Special care may be needed.  Overdosage: If you think you have taken too much of this medicine contact a poison control center or emergency room at once.  NOTE: This medicine is only for you. Do not share this medicine with others.  What if I miss a dose?  If you miss a dose, take it as soon as you can. If it is almost time for your next dose, take only that dose. Do not take double or extra doses.  What may interact with this medicine?  -birth control pills  -some antibiotics like tobramycin  This list may not describe all  possible interactions. Give your health care provider a list of all the medicines, herbs, non-prescription drugs, or dietary supplements you use. Also tell them if you smoke, drink alcohol, or use illegal drugs. Some items may interact with your medicine.  What should I watch for while using this medicine?  Your condition will be monitored carefully while you are receiving this medicine.  Do not treat diarrhea with over the counter products. Contact your doctor if you have diarrhea that lasts more than 2 days or if it is severe and watery.  What side effects may I notice from receiving this medicine?  Side effects that you should report to your doctor or health care professional as soon as possible:  -allergic reactions like skin rash, itching or hives, swelling of the face, lips, or tongue  -breathing problems  -fever, infection  -high or low blood pressure  -muscle pain  -numb or tingling pain  -trouble passing urine or change in the amount of urine  -unusually tired or weak  -vomiting  Side effects that usually do not require medical attention (report to your doctor or health care professional if they continue or are bothersome):  -constipation or diarrhea  -trouble sleeping  -headache  -nausea  -stomach upset  This list may not describe all possible side effects. Call your doctor for medical advice about side effects. You may report   side effects to FDA at 1-800-FDA-1088.  Where should I keep my medicine?  Keep out of the reach of children.  If you are using this medicine at home, you will be instructed on how to store this medicine. Throw away any unused medicine after the expiration date on the label.  NOTE: This sheet is a summary. It may not cover all possible information. If you have questions about this medicine, talk to your doctor, pharmacist, or health care provider.      2016, [employer]vier/[employer] Standard. (2013-01-16 07:33:48)

## 2015-06-25 ENCOUNTER — Encounter (HOSPITAL_COMMUNITY)
Admission: RE | Admit: 2015-06-25 | Discharge: 2015-06-25 | Disposition: A | Discharge status: Home / Self Care | Payer: Medicare Other | Source: Ambulatory Visit | Attending: Orthopedic Surgery | Admitting: Orthopedic Surgery

## 2015-06-25 DIAGNOSIS — A4902 Methicillin resistant Staphylococcus aureus infection, unspecified site: Secondary | ICD-10-CM | POA: Diagnosis not present

## 2015-06-25 MED ORDER — SODIUM CHLORIDE 0.9 % IJ SOLN: 10.0000 mL | INTRAMUSCULAR | Status: AC | PRN

## 2015-06-25 MED ORDER — SODIUM CHLORIDE 0.9 % IV SOLN
Freq: Once | INTRAVENOUS | Status: AC
  Administered 2015-06-25: 09:00:00 via INTRAVENOUS

## 2015-06-25 MED ORDER — SODIUM CHLORIDE 0.9 % IV SOLN
750.0000 mg | INTRAVENOUS | Status: AC
  Administered 2015-06-25: 750 mg via INTRAVENOUS
  Filled 2015-06-25 (×2): qty 15

## 2015-06-25 NOTE — Progress Notes (Signed)
Discharged to home. Pt knows she is to continue radiation tx. Berlene Dixson, [employer]

## 2015-06-25 NOTE — Progress Notes (Signed)
Pt arrived on unit 0835. VSS, iv site accessed. [name], [employer]

## 2015-06-25 NOTE — Progress Notes (Signed)
Iv abx infused. IV removed per pt request: "This is my last dose." [name], [employer]

## 2015-06-26 DIAGNOSIS — I509 Heart failure, unspecified: Secondary | ICD-10-CM | POA: Diagnosis not present

## 2015-06-26 DIAGNOSIS — C541 Malignant neoplasm of endometrium: Secondary | ICD-10-CM | POA: Diagnosis present

## 2015-06-26 DIAGNOSIS — E119 Type 2 diabetes mellitus without complications: Secondary | ICD-10-CM | POA: Diagnosis not present

## 2015-06-26 DIAGNOSIS — N189 Chronic kidney disease, unspecified: Secondary | ICD-10-CM | POA: Diagnosis not present

## 2015-06-26 DIAGNOSIS — G4733 Obstructive sleep apnea (adult) (pediatric): Secondary | ICD-10-CM | POA: Diagnosis not present

## 2015-06-26 DIAGNOSIS — E785 Hyperlipidemia, unspecified: Secondary | ICD-10-CM | POA: Diagnosis not present

## 2015-06-26 DIAGNOSIS — F329 Major depressive disorder, single episode, unspecified: Secondary | ICD-10-CM | POA: Diagnosis not present

## 2015-06-26 DIAGNOSIS — Z510 Encounter for antineoplastic radiation therapy: Secondary | ICD-10-CM | POA: Diagnosis present

## 2015-06-26 DIAGNOSIS — J45909 Unspecified asthma, uncomplicated: Secondary | ICD-10-CM | POA: Diagnosis not present

## 2015-06-28 ENCOUNTER — Ambulatory Visit
Admission: RE | Admit: 2015-06-28 | Discharge: 2015-06-28 | Disposition: A | Discharge status: Home / Self Care | Payer: Medicare Other | Source: Ambulatory Visit | Attending: Radiation Oncology | Admitting: Radiation Oncology

## 2015-06-28 ENCOUNTER — Encounter: Payer: Self-pay | Admitting: Radiation Oncology

## 2015-06-28 VITALS — BP 123/68 | HR 79 | Temp 98.4°F | Ht 66.0 in | Wt >= 6400 oz

## 2015-06-28 DIAGNOSIS — Z510 Encounter for antineoplastic radiation therapy: Secondary | ICD-10-CM | POA: Diagnosis not present

## 2015-06-28 DIAGNOSIS — C541 Malignant neoplasm of endometrium: Secondary | ICD-10-CM

## 2015-06-28 NOTE — Progress Notes (Signed)
  Radiation Oncology         [phone] ________________________________  Name: [name] MRN: [id number]  Date: 06/28/2015  DOB: 10-Aug-1960  Weekly Radiation Therapy Management    ICD-9-CM ICD-10-CM   1. Endometrial cancer (HCC) 182.0 C54.1      Current Dose: 25.2 Gy     Planned Dose:  50.4 Gy  Narrative . . . . . . . . The patient presents for routine under treatment assessment.                                   The patient is having some diarrhea. She takes 3-4 Imodium per day. She also has some urinary urgency and frequency as well as some burning with urination. No strong odor to her urine or cloudiness.  patient's vaginal bleeding stopped During the first week of her radiation treatments. She is having some nausea which is controlled well with Phenergan                                 Set-up films were reviewed.                                 The chart was checked. Physical Findings. . .  height is 5\' 6"  (1.676 m) and weight is 400 lb (181.439 kg). Her temperature is 98.4 F (36.9 C). Her blood pressure is 123/68 and her pulse is 79. . The lungs are clear. The heart has regular rhythm and rate. The abdomen is soft and nontender with normal bowel sounds. Impression . . . . . . . The patient is tolerating radiation. Plan . . . . . . . . . . . . Continue treatment as planned.  ________________________________   Blair Promise, PhD, MD

## 2015-06-28 NOTE — Progress Notes (Addendum)
Ms. Ridinger is here for her 14/28 fraction of radiation to her Pelvis. She denies pain at this time. She has some redness over her vaginal area which has improved since last week with the use of aloe vesta cream 2-3 times daily. She reports some urgency, frequency, and burning with urination. She reports 3-4 diarrhea stools daily and she is taking 3-4 immodium tablets daily. She also reports some nausea early in the morning and late in the evening, she takes phenergan twice daily for the nausea.   BP 123/68 mmHg  Pulse 79  Temp(Src) 98.4 F (36.9 C)  Ht 5\' 6"  (1.676 m)  Wt 400 lb (181.439 kg)  BMI 64.59 kg/m2   Wt Readings from Last 3 Encounters:  06/28/15 400 lb (181.439 kg)  06/22/15 388 lb 4.8 oz (176.132 kg)  06/22/15 407 lb (184.614 kg)

## 2015-06-29 ENCOUNTER — Ambulatory Visit: Payer: Medicare Other

## 2015-06-30 ENCOUNTER — Ambulatory Visit
Admission: RE | Admit: 2015-06-30 | Discharge: 2015-06-30 | Disposition: A | Discharge status: Home / Self Care | Payer: Medicare Other | Source: Ambulatory Visit | Attending: Radiation Oncology | Admitting: Radiation Oncology

## 2015-06-30 ENCOUNTER — Telehealth: Payer: Self-pay

## 2015-06-30 DIAGNOSIS — Z510 Encounter for antineoplastic radiation therapy: Secondary | ICD-10-CM | POA: Diagnosis not present

## 2015-06-30 NOTE — Telephone Encounter (Signed)
[name] returned my phone call this afternoon. She states she is feeling better, and has not had a fever today, however she still does report diarrhea. She did receive radiation today without any problems, and plans to return tomorrow for her treatment.

## 2015-06-30 NOTE — Telephone Encounter (Signed)
I called and left a voice mail on Ms. [name]'s mobile phone to inquire about how she is feeling, and if she would be able to come for radiation today. I asked her to call back at when she was able.

## 2015-07-01 ENCOUNTER — Ambulatory Visit
Admission: RE | Admit: 2015-07-01 | Discharge: 2015-07-01 | Disposition: A | Discharge status: Home / Self Care | Payer: Medicare Other | Source: Ambulatory Visit | Attending: Radiation Oncology | Admitting: Radiation Oncology

## 2015-07-01 DIAGNOSIS — Z510 Encounter for antineoplastic radiation therapy: Secondary | ICD-10-CM | POA: Diagnosis not present

## 2015-07-04 ENCOUNTER — Ambulatory Visit: Payer: Medicare Other | Admitting: Radiation Oncology

## 2015-07-04 ENCOUNTER — Ambulatory Visit: Payer: Medicare Other

## 2015-07-04 ENCOUNTER — Ambulatory Visit
Admission: RE | Admit: 2015-07-04 | Discharge: 2015-07-04 | Disposition: A | Discharge status: Home / Self Care | Payer: Medicare Other | Source: Ambulatory Visit | Attending: Radiation Oncology | Admitting: Radiation Oncology

## 2015-07-05 ENCOUNTER — Ambulatory Visit
Admission: RE | Admit: 2015-07-05 | Discharge: 2015-07-05 | Disposition: A | Discharge status: Home / Self Care | Payer: Medicare Other | Source: Ambulatory Visit | Attending: Radiation Oncology | Admitting: Radiation Oncology

## 2015-07-05 ENCOUNTER — Ambulatory Visit: Payer: Medicare Other

## 2015-07-06 ENCOUNTER — Encounter: Payer: Self-pay | Admitting: Radiation Oncology

## 2015-07-06 ENCOUNTER — Ambulatory Visit
Admission: RE | Admit: 2015-07-06 | Discharge: 2015-07-06 | Disposition: A | Discharge status: Home / Self Care | Payer: Medicare Other | Source: Ambulatory Visit | Attending: Radiation Oncology | Admitting: Radiation Oncology

## 2015-07-06 ENCOUNTER — Ambulatory Visit (HOSPITAL_BASED_OUTPATIENT_CLINIC_OR_DEPARTMENT_OTHER)
Admission: RE | Admit: 2015-07-06 | Discharge: 2015-07-06 | Disposition: A | Discharge status: Home / Self Care | Payer: Medicare Other | Source: Ambulatory Visit | Attending: Radiation Oncology | Admitting: Radiation Oncology

## 2015-07-06 ENCOUNTER — Other Ambulatory Visit: Payer: Self-pay

## 2015-07-06 VITALS — BP 138/65 | HR 62 | Temp 98.1°F | Ht 66.0 in | Wt >= 6400 oz

## 2015-07-06 DIAGNOSIS — R300 Dysuria: Secondary | ICD-10-CM

## 2015-07-06 DIAGNOSIS — C541 Malignant neoplasm of endometrium: Secondary | ICD-10-CM

## 2015-07-06 DIAGNOSIS — Z510 Encounter for antineoplastic radiation therapy: Secondary | ICD-10-CM | POA: Diagnosis not present

## 2015-07-06 LAB — URINALYSIS, MICROSCOPIC - CHCC
Bilirubin (Urine): NEGATIVE
GLUCOSE UR CHCC: 2000 mg/dL
Ketones: NEGATIVE mg/dL
Leukocyte Esterase: NEGATIVE
NITRITE: NEGATIVE
PH: 6 (ref 4.6–8.0)
PROTEIN: NEGATIVE mg/dL
Specific Gravity, Urine: 1.01 (ref 1.003–1.035)
Urobilinogen, UR: 0.2 mg/dL (ref 0.2–1)
WBC UA: NEGATIVE (ref 0–2)

## 2015-07-06 NOTE — Progress Notes (Addendum)
[name] is here for her 17th fraction of radiation to her Pelvis. She presents before her treatment to be seen by Dr. Isidore Moos complaining of burning with urination, and low grade fevers mostly at night. The fevers have been as high as 99.9, and today she was 98.1. She has burning to her perianal area which she rates a 6/10 on the pain scale. She is using aloevesta 2-3 times daily to this area. She is using an antifungal powder to her skin folds to her abdomen. She reports she is not having diarrhea, but complains of being constipated and has used a stool softener with partial relief. She complains of extreme fatigue, and feels like she could sleep constantly able to.   BP 138/65 mmHg  Pulse 62  Temp(Src) 98.1 F (36.7 C)
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
## 2015-07-07 ENCOUNTER — Telehealth: Payer: Self-pay

## 2015-07-07 ENCOUNTER — Ambulatory Visit
Admission: RE | Admit: 2015-07-07 | Discharge: 2015-07-07 | Disposition: A | Discharge status: Home / Self Care | Payer: Medicare Other | Source: Ambulatory Visit | Attending: Radiation Oncology | Admitting: Radiation Oncology

## 2015-07-07 ENCOUNTER — Ambulatory Visit: Payer: Medicare Other

## 2015-07-07 NOTE — Telephone Encounter (Signed)
[name] called me to inform me that she had seen Dr. Cordelia Pen today, and after much discussion and thought she has decided against brachytherapy, and wants to continue receiving radiation as planned here at [facility]. She also informed that she was not coming for radiation therapy today due to exhaustion from her trip to [facility] today. She states she will be here tomorrow. She knows to call me if she has any other concerns.

## 2015-07-08 ENCOUNTER — Ambulatory Visit
Admission: RE | Admit: 2015-07-08 | Discharge: 2015-07-08 | Disposition: A | Discharge status: Home / Self Care | Payer: Medicare Other | Source: Ambulatory Visit | Attending: Radiation Oncology | Admitting: Radiation Oncology

## 2015-07-08 ENCOUNTER — Other Ambulatory Visit: Payer: Self-pay | Admitting: Radiation Oncology

## 2015-07-08 DIAGNOSIS — N3000 Acute cystitis without hematuria: Secondary | ICD-10-CM

## 2015-07-08 DIAGNOSIS — Z510 Encounter for antineoplastic radiation therapy: Secondary | ICD-10-CM | POA: Diagnosis not present

## 2015-07-08 LAB — URINE CULTURE

## 2015-07-08 MED ORDER — NITROFURANTOIN MONOHYD MACRO 100 MG PO CAPS: 100.0000 mg | ORAL_CAPSULE | Freq: Two times a day (BID) | ORAL | Status: DC

## 2015-07-11 ENCOUNTER — Encounter: Payer: Self-pay | Admitting: Radiation Oncology

## 2015-07-11 ENCOUNTER — Ambulatory Visit
Admission: RE | Admit: 2015-07-11 | Discharge: 2015-07-11 | Disposition: A | Discharge status: Home / Self Care | Payer: Medicare Other | Source: Ambulatory Visit | Attending: Radiation Oncology | Admitting: Radiation Oncology

## 2015-07-11 VITALS — BP 144/68 | HR 68 | Temp 98.8°F | Resp 20 | Wt >= 6400 oz

## 2015-07-11 DIAGNOSIS — C541 Malignant neoplasm of endometrium: Secondary | ICD-10-CM

## 2015-07-11 DIAGNOSIS — Z510 Encounter for antineoplastic radiation therapy: Secondary | ICD-10-CM | POA: Diagnosis not present

## 2015-07-11 MED ORDER — LORAZEPAM 0.5 MG PO TABS: ORAL_TABLET | ORAL | Status: DC

## 2015-07-11 NOTE — Progress Notes (Signed)
Weekly Management Note:  Outpatient    ICD-9-CM ICD-10-CM   1. Endometrial cancer (HCC) 182.0 C54.1 LORazepam (ATIVAN) 0.5 MG tablet    Current Dose: 34.2 Gy   Projected Dose: approx 50.4 Gy    Narrative:  The patient presents for routine under treatment assessment.  CBCT/MVCT images/Port film x-rays were reviewed.  The chart was checked. She saw Dr Cordelia Pen and decided against brachytherapy due to potential risks.  She reports continued low grade temps at home.  She has recent skin abrasions over her leg, bandaged.  Cotninued dysuria.  I Rxed her on Macrobid on 1-14 after cultures great Klebsiella Pneumoniae from her urine.  I called her on 1-14 to alert her, and she managed to start the ABX on 07-10-15.    Has nausea when lying down for RT.  Skin in perineum is better per patient and caregiver   Physical Findings:  Wt Readings from Last 3 Encounters:  07/11/15 408 lb 4.8 oz (185.204 kg)  07/06/15 412 lb 11.2 oz (187.199 kg)  06/28/15 400 lb (181.439 kg)   Filed Vitals:   07/11/15 1134  BP: 144/68  Pulse: 68  Temp: 98.8 F (37.1 C)  Resp: 20    NAD, in in [facility], non toxic appearing   CBC    Component Value Date/Time   WBC 8.1 06/01/2015 1128   WBC 5.3 01/10/2015 1352   WBC 7.7 02/14/2010 1446   RBC 4.60 06/01/2015 1128   RBC 3.93 01/10/2015 1352   RBC 4.20 02/14/2010 1446   HGB 13.3 06/16/2015 0948   HGB 13.6 06/01/2015 1128   HGB 13.3 02/14/2010 1446   HCT 41.6 06/01/2015 1128   HCT 36.4 01/10/2015 1352   HCT 38.4 02/14/2010 1446   PLT 158 06/01/2015 1128   PLT 108* 01/10/2015 1352   PLT 189 02/14/2010 1446   MCV 90.4 06/01/2015 1128   MCV 92.6 01/10/2015 1352   MCV 91 02/14/2010 1446   MCH 29.7 06/01/2015 1128   MCH 31.0 01/10/2015 1352   MCH 31.6 02/14/2010 1446   MCHC 32.8 06/01/2015 1128   MCHC 33.5 01/10/2015 1352   MCHC 34.6 02/14/2010 1446   RDW 15.0* 06/01/2015 1128   RDW 14.2 01/10/2015 1352   RDW 13.5 02/14/2010 1446   LYMPHSABS 2.4  06/01/2015 1128   LYMPHSABS 1.8 11/08/2014 1016   MONOABS 1.1* 06/01/2015 1128   MONOABS 0.8 11/08/2014 1016   EOSABS 0.2 06/01/2015 1128   EOSABS 0.1 11/08/2014 1016   BASOSABS 0.1 06/01/2015 1128   BASOSABS 0.0 11/08/2014 1016     CMP     Component Value Date/Time   NA 134* 06/16/2015 0948   NA 135* 06/01/2015 1128   NA 137 02/14/2010 1446   K 4.3 06/16/2015 0948   K 4.4 06/01/2015 1128   K 4.1 02/14/2010 1446   CL 103 06/16/2015 0948   CL 96* 02/14/2010 1446   CO2 21* 06/16/2015 0948   CO2 21* 06/01/2015 1128   CO2 26 02/14/2010 1446   GLUCOSE 359* 06/16/2015 0948   GLUCOSE 291* 06/01/2015 1128   GLUCOSE 171* 02/14/2010 1446   BUN 9 06/16/2015 0948   BUN 14.8 06/01/2015 1128   BUN 29* 02/14/2010 1446   CREATININE 0.86 06/16/2015 0948   CREATININE 1.2* 06/01/2015 1128   CREATININE 1.0 02/14/2010 1446   CALCIUM 9.1 06/16/2015 0948   CALCIUM 9.4 06/01/2015 1128   CALCIUM 9.0 02/14/2010 1446   PROT 8.1 06/01/2015 1128   PROT 7.0 01/17/2015 1515  PROT 7.7 02/14/2010 1446   ALBUMIN 3.5 06/01/2015 1128   ALBUMIN 3.4* 01/17/2015 1515   ALBUMIN 3.7 02/14/2010 1446   AST 16 06/01/2015 1128   AST 26 01/17/2015 1515   AST 39* 02/14/2010 1446   ALT 17 06/01/2015 1128   ALT 25 01/17/2015 1515   ALT 44 02/14/2010 1446   ALKPHOS 66 06/01/2015 1128   ALKPHOS 45 01/17/2015 1515   ALKPHOS 42 02/14/2010 1446   BILITOT 0.57 06/01/2015 1128   BILITOT 0.7 01/17/2015 1515   BILITOT 0.40 02/14/2010 1446   GFRNONAA >60 06/16/2015 0948   GFRAA >60 06/16/2015 0948     Impression:  The patient is tolerating radiotherapy.  Continue ABX for UTI.  Plan:   Encouraged cont'd showers with  warm soapy water frequently, blot dry, continue to use aloe vesta  Use breeze from electric fan over perineum as often as possible to soothe and heal skin.   Definitive external RT as pt is declining brachytherapy.  Pt told to call PCP if temps are not normal within 48 hours, to rule out other  source of infection, ie cellulitis of leg.   Ativan for nausea prior to RT -----------------------------------  Eppie Gibson, MD

## 2015-07-11 NOTE — Progress Notes (Signed)
PAIN: She is currently in no pain.  URINARY: Reports urinary frequency, nausea and urinary incontinence and pain with Urination-burning.  Pt states they urinate 2 - 3 times per night. Denies vaginal discharge or bleeding. BOWEL: Pt reports diarrhea and constipation.  Pt reports she had a bowel movement, soft.   OTHER: Pt complains of fatigue and loss of sleep. Fair appetite.  BP 144/68 mmHg  Pulse 68  Temp(Src) 98.8 F (37.1 C) (Oral)  Resp 20  Wt 408 lb 4.8 oz (185.204 kg)  SpO2 100% Wt Readings from Last 3 Encounters:  07/11/15 408 lb 4.8 oz (185.204 kg)  07/06/15 412 lb 11.2 oz (187.199 kg)  06/28/15 400 lb (181.439 kg)

## 2015-07-12 ENCOUNTER — Ambulatory Visit
Admission: RE | Admit: 2015-07-12 | Discharge: 2015-07-12 | Disposition: A | Discharge status: Home / Self Care | Payer: Medicare Other | Source: Ambulatory Visit | Attending: Radiation Oncology | Admitting: Radiation Oncology

## 2015-07-12 DIAGNOSIS — Z510 Encounter for antineoplastic radiation therapy: Secondary | ICD-10-CM | POA: Diagnosis not present

## 2015-07-13 ENCOUNTER — Ambulatory Visit
Admission: RE | Admit: 2015-07-13 | Discharge: 2015-07-13 | Disposition: A | Discharge status: Home / Self Care | Payer: Medicare Other | Source: Ambulatory Visit | Attending: Radiation Oncology | Admitting: Radiation Oncology

## 2015-07-13 ENCOUNTER — Encounter: Payer: Self-pay | Admitting: Radiation Oncology

## 2015-07-13 ENCOUNTER — Ambulatory Visit (INDEPENDENT_AMBULATORY_CARE_PROVIDER_SITE_OTHER): Payer: Medicare Other | Admitting: Infectious Diseases

## 2015-07-13 ENCOUNTER — Ambulatory Visit: Payer: Medicare Other

## 2015-07-13 VITALS — BP 140/77 | HR 70 | Temp 98.3°F

## 2015-07-13 DIAGNOSIS — C541 Malignant neoplasm of endometrium: Secondary | ICD-10-CM | POA: Diagnosis not present

## 2015-07-13 DIAGNOSIS — M869 Osteomyelitis, unspecified: Secondary | ICD-10-CM | POA: Insufficient documentation

## 2015-07-13 DIAGNOSIS — M86649 Other chronic osteomyelitis, unspecified hand: Secondary | ICD-10-CM

## 2015-07-13 DIAGNOSIS — Z510 Encounter for antineoplastic radiation therapy: Secondary | ICD-10-CM | POA: Diagnosis not present

## 2015-07-13 MED ORDER — DOXYCYCLINE HYCLATE 100 MG PO TABS: 100.0000 mg | ORAL_TABLET | Freq: Every day | ORAL | Status: AC

## 2015-07-13 MED ORDER — PROMETHAZINE HCL 25 MG PO TABS: 25.0000 mg | ORAL_TABLET | ORAL | Status: DC | PRN

## 2015-07-13 MED ORDER — DOXYCYCLINE HYCLATE 100 MG PO TABS: 100.0000 mg | ORAL_TABLET | Freq: Two times a day (BID) | ORAL | Status: AC
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
## 2015-07-13 NOTE — Assessment & Plan Note (Addendum)
Will try to get her to take doxy for 1 month.  Will continue phenergan.  She is intolerant of most anbx that would be used to treat proteus and as she has had MRSA in Cx x2 will focus on this.  Need records from Dr Levell July office (per his notes on 12-9 there was no osteo on plain films).  Will see her back in 1 month.

## 2015-07-14 ENCOUNTER — Ambulatory Visit
Admission: RE | Admit: 2015-07-14 | Discharge: 2015-07-14 | Disposition: A | Discharge status: Home / Self Care | Payer: Medicare Other | Source: Ambulatory Visit | Attending: Radiation Oncology | Admitting: Radiation Oncology

## 2015-07-14 ENCOUNTER — Ambulatory Visit: Payer: Medicare Other

## 2015-07-14 DIAGNOSIS — Z510 Encounter for antineoplastic radiation therapy: Secondary | ICD-10-CM | POA: Diagnosis not present

## 2015-07-15 ENCOUNTER — Other Ambulatory Visit: Payer: Self-pay | Admitting: Radiation Oncology

## 2015-07-15 ENCOUNTER — Ambulatory Visit: Payer: Medicare Other

## 2015-07-15 ENCOUNTER — Telehealth: Payer: Self-pay | Admitting: *Deleted

## 2015-07-15 ENCOUNTER — Ambulatory Visit
Admission: RE | Admit: 2015-07-15 | Discharge: 2015-07-15 | Disposition: A | Discharge status: Home / Self Care | Payer: Medicare Other | Source: Ambulatory Visit | Attending: Radiation Oncology | Admitting: Radiation Oncology

## 2015-07-15 DIAGNOSIS — Z7901 Long term (current) use of anticoagulants: Secondary | ICD-10-CM

## 2015-07-15 DIAGNOSIS — Z510 Encounter for antineoplastic radiation therapy: Secondary | ICD-10-CM | POA: Diagnosis not present

## 2015-07-15 NOTE — Telephone Encounter (Signed)
CALLED PATIENT TO ASK ABOUT GETTING LAB ON 07-18-15, LVM FOR A RETURN CALL

## 2015-07-18 ENCOUNTER — Ambulatory Visit: Payer: Medicare Other

## 2015-07-18 ENCOUNTER — Other Ambulatory Visit (HOSPITAL_COMMUNITY)
Admission: RE | Admit: 2015-07-18 | Discharge: 2015-07-18 | Disposition: A | Discharge status: Home / Self Care | Payer: Medicare Other | Source: Ambulatory Visit | Attending: Radiation Oncology | Admitting: Radiation Oncology

## 2015-07-18 ENCOUNTER — Encounter: Payer: Self-pay | Admitting: Radiation Oncology

## 2015-07-18 ENCOUNTER — Encounter: Payer: Self-pay | Admitting: *Deleted

## 2015-07-18 ENCOUNTER — Ambulatory Visit
Admission: RE | Admit: 2015-07-18 | Discharge: 2015-07-18 | Disposition: A | Discharge status: Home / Self Care | Payer: Medicare Other | Source: Ambulatory Visit | Attending: Radiation Oncology | Admitting: Radiation Oncology

## 2015-07-18 ENCOUNTER — Telehealth: Payer: Self-pay

## 2015-07-18 VITALS — BP 130/68 | HR 64 | Temp 97.8°F | Ht 66.0 in | Wt >= 6400 oz

## 2015-07-18 DIAGNOSIS — C541 Malignant neoplasm of endometrium: Secondary | ICD-10-CM

## 2015-07-18 DIAGNOSIS — Z7901 Long term (current) use of anticoagulants: Secondary | ICD-10-CM

## 2015-07-18 DIAGNOSIS — Z510 Encounter for antineoplastic radiation therapy: Secondary | ICD-10-CM | POA: Diagnosis not present

## 2015-07-18 DIAGNOSIS — Z5181 Encounter for therapeutic drug level monitoring: Secondary | ICD-10-CM | POA: Diagnosis present

## 2015-07-18 LAB — PROTIME-INR
INR: 6.85 (ref 0.00–1.49)
PROTHROMBIN TIME: 57 s — AB (ref 11.6–15.2)

## 2015-07-18 MED ORDER — DIPHENOXYLATE-ATROPINE 2.5-0.025 MG PO TABS: 1.0000 | ORAL_TABLET | Freq: Four times a day (QID) | ORAL | Status: DC | PRN

## 2015-07-18 NOTE — Progress Notes (Unsigned)
Critical PT 57.0 and INR 6.85 faxed to Dr. Kelton Pillar at [phone].  sdd 07-18-2015 1:15 pm.  Called to Dr. also

## 2015-07-18 NOTE — Progress Notes (Signed)
[name] is here for her 24th fraction of radiation to her Pelvis. She denies any pain at this time. She does complain of tenderness to her radiation site and is using aloe vesta 2-3 times daily. She denies any urinary problems at this time. She does report having diarrhea 3-4 times a day, and a lot of gas in a 4 hour time span in the evenings. She is taking immodium with each diarrhea stool per her report. She does admit to some fatigue, and rests during the day at times.   BP 130/68 mmHg  Pulse 64  Temp(Src) 97.8 F (36.6 C)  Ht 5\' 6"  (1.676 m)  Wt 412 lb 6.4 oz (187.063 kg)  BMI 66.59 kg/m2   Wt Readings from Last 3 Encounters:  07/18/15 412 lb 6.4 oz (187.063 kg)  07/11/15 408 lb 4.8 oz (185.204 kg)  07/06/15 412 lb 11.2 oz (187.199 kg)

## 2015-07-18 NOTE — Telephone Encounter (Signed)
I called in critical labs to Dr. Quin Hoop office per Dr. Pearlie Oyster request. PT 57 and INR 6.85.

## 2015-07-18 NOTE — Progress Notes (Signed)
Weekly Management Note:  Outpatient    ICD-9-CM ICD-10-CM   1. Endometrial cancer (HCC) 182.0 C54.1 diphenoxylate-atropine (LOMOTIL) 2.5-0.025 MG tablet    Current Dose: 43.2 Gy   Projected Dose: 50.4 Gy    Narrative:  The patient presents for routine under treatment assessment.  CBCT/MVCT images/Port film x-rays were reviewed.  The chart was checked. Completed Macrobid.  Improved dysuria.  No fevers    Skin in perineum is better per patient and caregiver  Pain controlled.  Diarrhea worse in spite of current meds   Physical Findings:  Wt Readings from Last 3 Encounters:  07/18/15 412 lb 6.4 oz (187.063 kg)  07/11/15 408 lb 4.8 oz (185.204 kg)  07/06/15 412 lb 11.2 oz (187.199 kg)   Filed Vitals:   07/18/15 1107  BP: 130/68  Pulse: 64  Temp: 97.8 F (36.6 C)    NAD, in in [facility], non toxic appearing   CBC    Component Value Date/Time   WBC 8.1 06/01/2015 1128   WBC 5.3 01/10/2015 1352   WBC 7.7 02/14/2010 1446   RBC 4.60 06/01/2015 1128   RBC 3.93 01/10/2015 1352   RBC 4.20 02/14/2010 1446   HGB 13.3 06/16/2015 0948   HGB 13.6 06/01/2015 1128   HGB 13.3 02/14/2010 1446   HCT 41.6 06/01/2015 1128   HCT 36.4 01/10/2015 1352   HCT 38.4 02/14/2010 1446   PLT 158 06/01/2015 1128   PLT 108* 01/10/2015 1352   PLT 189 02/14/2010 1446   MCV 90.4 06/01/2015 1128   MCV 92.6 01/10/2015 1352   MCV 91 02/14/2010 1446   MCH 29.7 06/01/2015 1128   MCH 31.0 01/10/2015 1352   MCH 31.6 02/14/2010 1446   MCHC 32.8 06/01/2015 1128   MCHC 33.5 01/10/2015 1352   MCHC 34.6 02/14/2010 1446   RDW 15.0* 06/01/2015 1128   RDW 14.2 01/10/2015 1352   RDW 13.5 02/14/2010 1446   LYMPHSABS 2.4 06/01/2015 1128   LYMPHSABS 1.8 11/08/2014 1016   MONOABS 1.1* 06/01/2015 1128   MONOABS 0.8 11/08/2014 1016   EOSABS 0.2 06/01/2015 1128   EOSABS 0.1 11/08/2014 1016   BASOSABS 0.1 06/01/2015 1128   BASOSABS 0.0 11/08/2014 1016     CMP     Component Value Date/Time   NA 134*  06/16/2015 0948   NA 135* 06/01/2015 1128   NA 137 02/14/2010 1446   K 4.3 06/16/2015 0948   K 4.4 06/01/2015 1128   K 4.1 02/14/2010 1446   CL 103 06/16/2015 0948   CL 96* 02/14/2010 1446   CO2 21* 06/16/2015 0948   CO2 21* 06/01/2015 1128   CO2 26 02/14/2010 1446   GLUCOSE 359* 06/16/2015 0948   GLUCOSE 291* 06/01/2015 1128   GLUCOSE 171* 02/14/2010 1446   BUN 9 06/16/2015 0948   BUN 14.8 06/01/2015 1128   BUN 29* 02/14/2010 1446   CREATININE 0.86 06/16/2015 0948   CREATININE 1.2* 06/01/2015 1128   CREATININE 1.0 02/14/2010 1446   CALCIUM 9.1 06/16/2015 0948   CALCIUM 9.4 06/01/2015 1128   CALCIUM 9.0 02/14/2010 1446   PROT 8.1 06/01/2015 1128   PROT 7.0 01/17/2015 1515   PROT 7.7 02/14/2010 1446   ALBUMIN 3.5 06/01/2015 1128   ALBUMIN 3.4* 01/17/2015 1515   ALBUMIN 3.7 02/14/2010 1446   AST 16 06/01/2015 1128   AST 26 01/17/2015 1515   AST 39* 02/14/2010 1446   ALT 17 06/01/2015 1128   ALT 25 01/17/2015 1515   ALT 44 02/14/2010 1446  ALKPHOS 66 06/01/2015 1128   ALKPHOS 45 01/17/2015 1515   ALKPHOS 42 02/14/2010 1446   BILITOT 0.57 06/01/2015 1128   BILITOT 0.7 01/17/2015 1515   BILITOT 0.40 02/14/2010 1446   GFRNONAA >60 06/16/2015 0948   GFRAA >60 06/16/2015 0948     Impression:  The patient is tolerating radiotherapy.    Replace imodium with Lomotil for diarrhea.  I will check her again in 2 days.  -----------------------------------  Eppie Gibson, MD

## 2015-07-19 ENCOUNTER — Ambulatory Visit: Payer: Medicare Other

## 2015-07-19 DIAGNOSIS — Z510 Encounter for antineoplastic radiation therapy: Secondary | ICD-10-CM | POA: Diagnosis not present

## 2015-07-20 ENCOUNTER — Encounter: Payer: Self-pay | Admitting: Radiation Oncology

## 2015-07-20 ENCOUNTER — Ambulatory Visit: Payer: Medicare Other

## 2015-07-20 ENCOUNTER — Other Ambulatory Visit: Payer: Self-pay | Admitting: Radiation Oncology

## 2015-07-20 ENCOUNTER — Ambulatory Visit
Admission: RE | Admit: 2015-07-20 | Discharge: 2015-07-20 | Disposition: A | Discharge status: Home / Self Care | Payer: Medicare Other | Source: Ambulatory Visit | Attending: Radiation Oncology | Admitting: Radiation Oncology

## 2015-07-20 ENCOUNTER — Other Ambulatory Visit: Payer: Self-pay

## 2015-07-20 VITALS — Temp 98.6°F

## 2015-07-20 DIAGNOSIS — C541 Malignant neoplasm of endometrium: Secondary | ICD-10-CM

## 2015-07-20 DIAGNOSIS — Z510 Encounter for antineoplastic radiation therapy: Secondary | ICD-10-CM | POA: Diagnosis not present

## 2015-07-20 DIAGNOSIS — Z7901 Long term (current) use of anticoagulants: Secondary | ICD-10-CM

## 2015-07-20 LAB — URINALYSIS, MICROSCOPIC - CHCC
BACTERIA UA: NEGATIVE
Bilirubin (Urine): NEGATIVE
Glucose: 2000 mg/dL
Ketones: NEGATIVE mg/dL
LEUKOCYTE ESTERASE: NEGATIVE
NITRITE: NEGATIVE
Protein: <30 mg/dL
Specific Gravity, Urine: 1.01 (ref 1.003–1.035)
UROBILINOGEN UR: 0.2 mg/dL (ref 0.2–1)
pH: 6.5 (ref 4.6–8.0)

## 2015-07-20 IMAGING — CT CT ABD-PELV W/ CM
3 of 5 series · 13 of 36 positions shown, 19 images · IV contrast (READICAT/WATER & [ID] OMNI 300)
Comparison: 08/08/2012

CLINICAL DATA: Lower abdominal pain x3 months,
nausea/constipation/diarrhea. Low grade fever. Prior cholecystectomy
and uterine ablation. History of renal stones.

EXAM:
CT ABDOMEN AND PELVIS WITH CONTRAST
TECHNIQUE: Multidetector CT imaging of the abdomen and pelvis was performed
using the standard protocol following bolus administration of
intravenous contrast.
CONTRAST:  125mL OMNIPAQUE IOHEXOL 300 MG/ML  SOLN

[Series 3: abd/pelvis · axial · 0.98mm/px · z∈[-382,-2]mm · 7 of 102 slices shown, 12 images]
[im 13/102  soft-tissue]
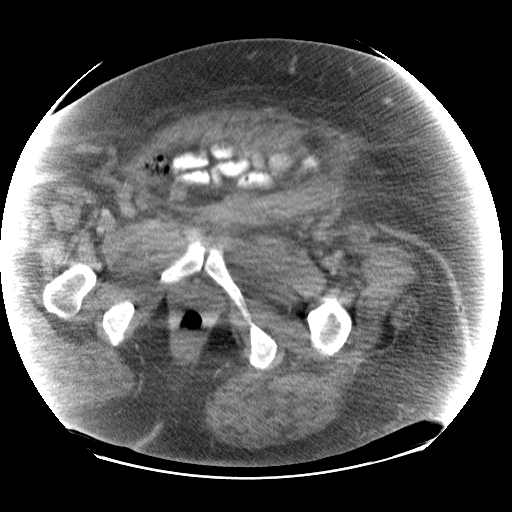
[im 13/102  bone]
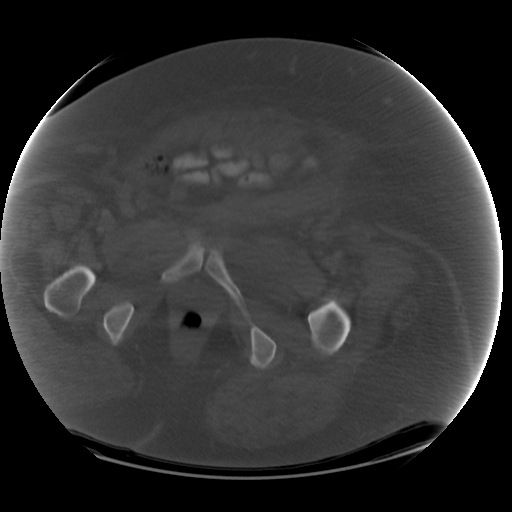
[im 26/102  soft-tissue]
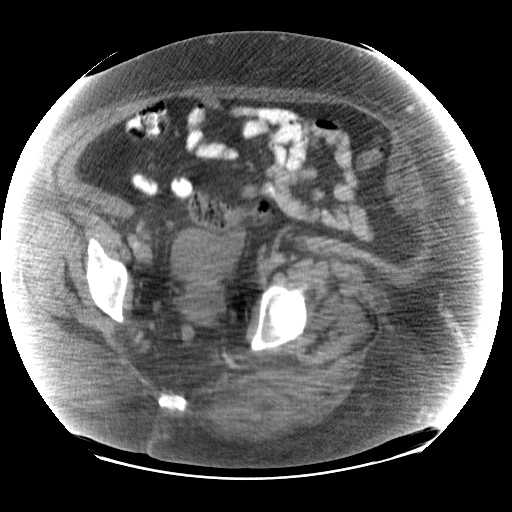
[im 38/102  soft-tissue]
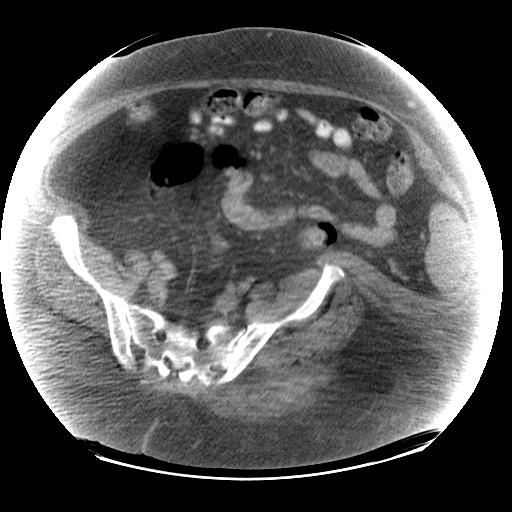
[im 51/102  soft-tissue]
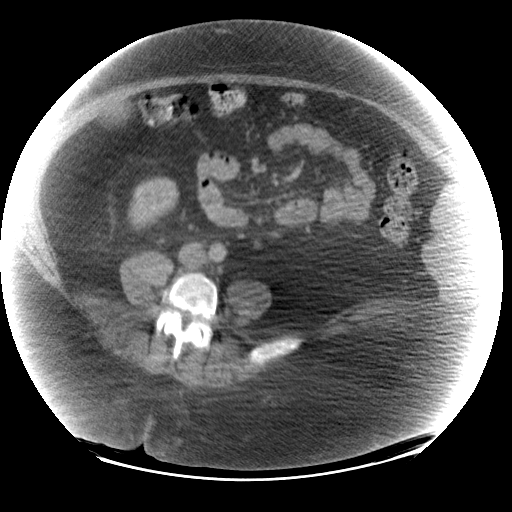
[im 51/102  lung]
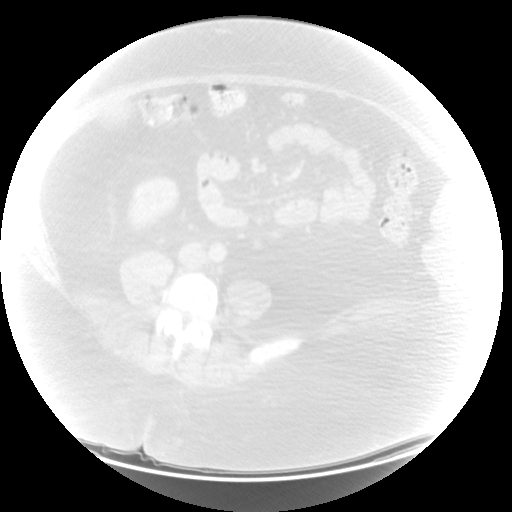
[im 64/102  soft-tissue]
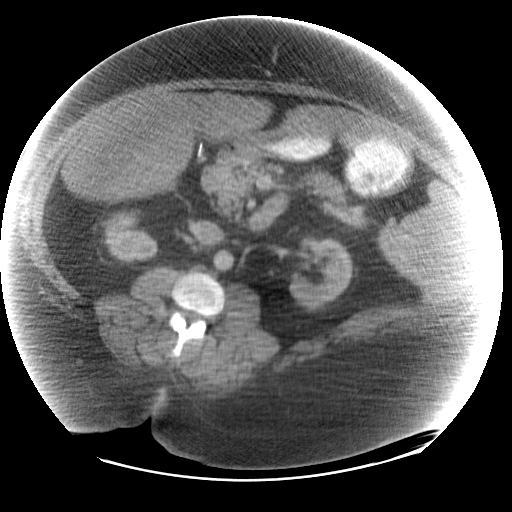
[im 64/102  lung]
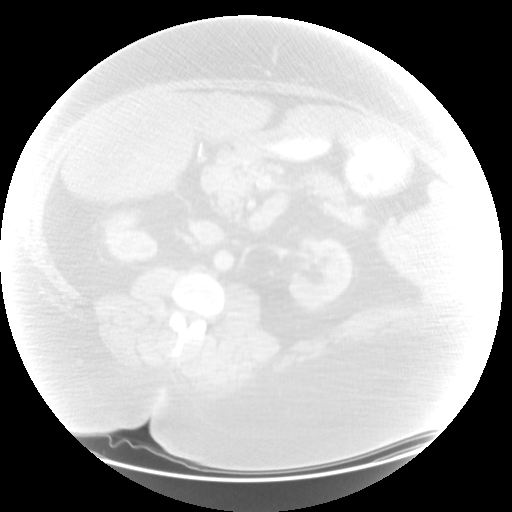
[im 76/102  soft-tissue]
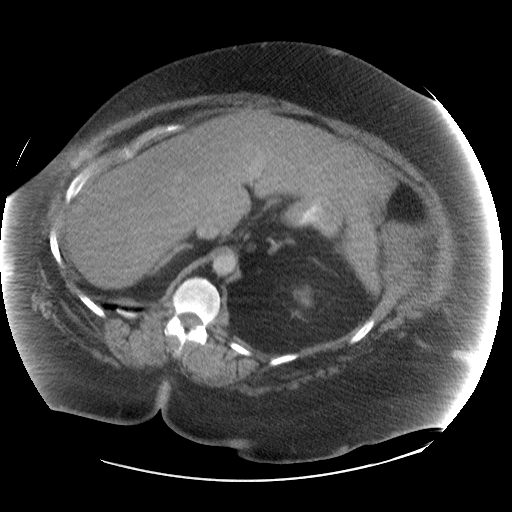
[im 76/102  lung]
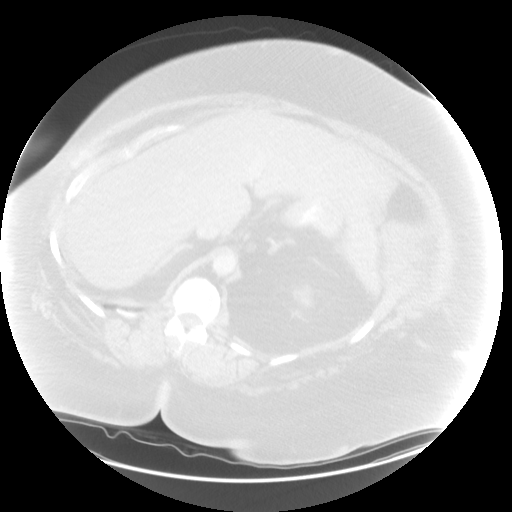
[im 89/102  soft-tissue]
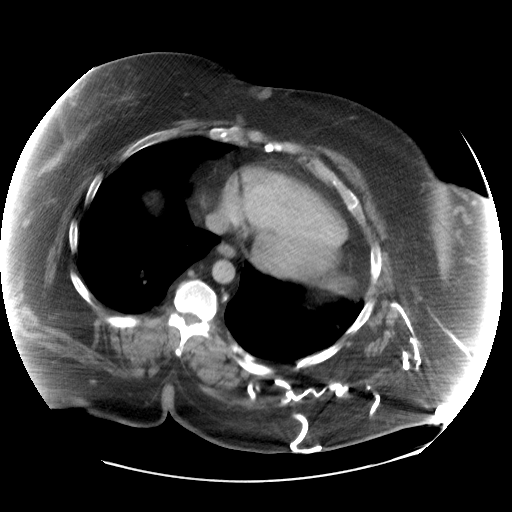
[im 89/102  lung]
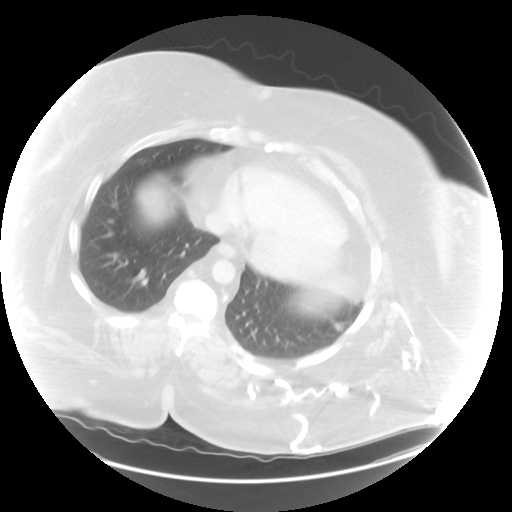

[Series 601: coronal body · coronal · 0.99mm/px · 1 of 184 slices shown, 2 images]
[im 62/184  soft-tissue]
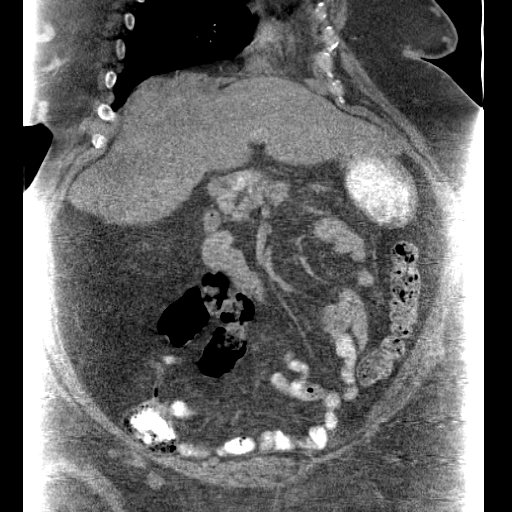
[im 62/184  bone]
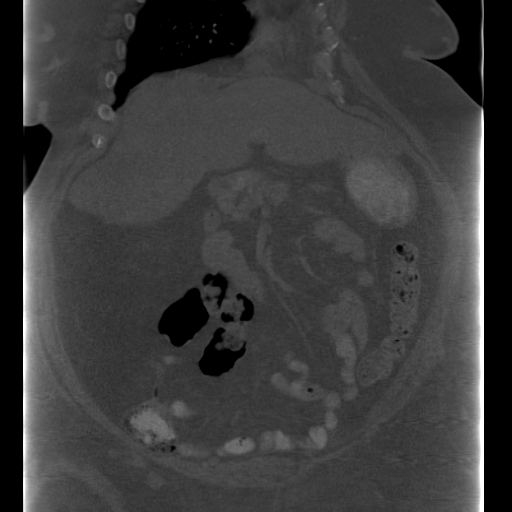

[Series 602: sagittal body · sagittal · 0.99mm/px · 5 of 198 slices shown]
[im 13/198  soft-tissue]
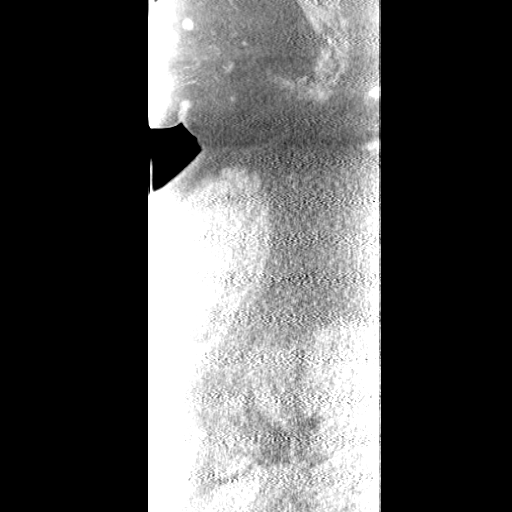
[im 37/198  soft-tissue]
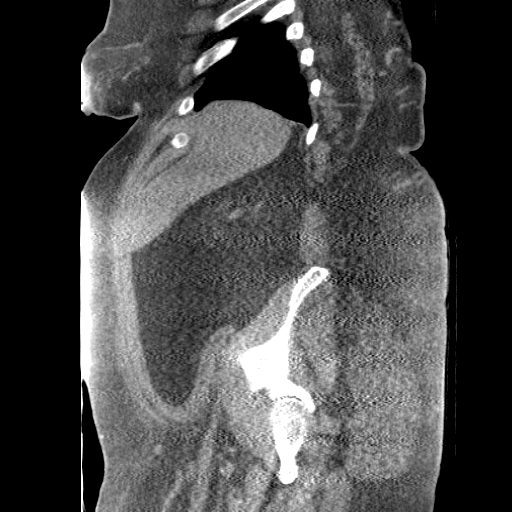
[im 62/198  soft-tissue]
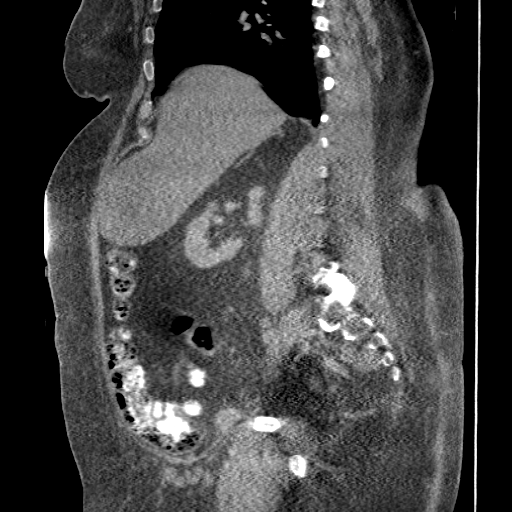
[im 87/198  soft-tissue]
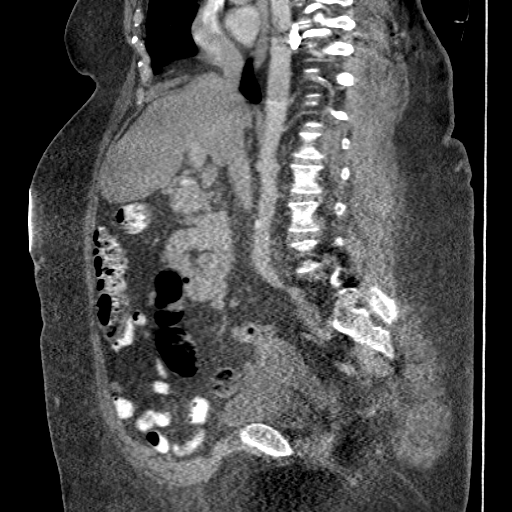
[im 111/198  soft-tissue]
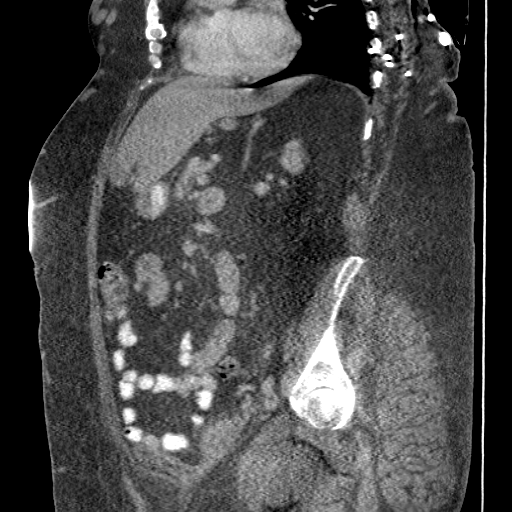

[13 of 36 positions shown; findings below may reference images not displayed]

FINDINGS: Lung bases are clear.

Heart is top-normal in size.

Hepatomegaly with mild to moderate hepatic steatosis.

Visualized spleen, pancreas, and adrenal glands are unremarkable.

Status post cholecystectomy. No intrahepatic or extrahepatic ductal
dilatation.

Kidneys are within normal limits.  No hydronephrosis.

No evidence of bowel obstruction.  Normal appendix.

No evidence of abdominal aortic aneurysm.

No abdominopelvic ascites.

No suspicious abdominopelvic lymphadenopathy.

Uterus and bilateral ovaries are grossly unremarkable.

Bladder is within normal limits.

Mild degenerative changes of the visualized thoracolumbar spine.
Mild prominence of the left inferior rectus muscle (series 3/ image
87) likely reflects obliquity when correlating with the appearance
on sagittal and coronal reformats.
IMPRESSION: No evidence of bowel obstruction.  Normal appendix.

Prior cholecystectomy.

Thyromegaly with mild to moderate hepatic steatosis.

## 2015-07-20 MED ORDER — OXYCODONE-ACETAMINOPHEN 5-325 MG PO TABS: ORAL_TABLET | ORAL | Status: DC

## 2015-07-20 NOTE — Progress Notes (Signed)
Weekly Management Note:  Outpatient      ICD-9-CM ICD-10-CM   1. Endometrial cancer (HCC) 182.0 C54.1    Current Dose: 46.8 Gy   Projected Dose: 50.4 Gy    Narrative:  The patient presents for routine under treatment assessment.  CBCT/MVCT images/Port film x-rays were reviewed.  The chart was checked. Only new complaint is more dysuria, from bladder; external tissues are no worse. Afebrile.   Physical Findings:  Vitals - 1 value per visit 99991111  SYSTOLIC   DIASTOLIC   Pulse   Temperature 98.6  Respirations   Weight (lb)   Height   BMI   VISIT REPORT    Wt Readings from Last 3 Encounters:  07/18/15 412 lb 6.4 oz (187.063 kg)  07/11/15 408 lb 4.8 oz (185.204 kg)  07/06/15 412 lb 11.2 oz (187.199 kg)   There were no vitals filed for this visit.  NAD, in in [facility], non toxic appearing   CBC    Component Value Date/Time   WBC 8.1 06/01/2015 1128   WBC 5.3 01/10/2015 1352   WBC 7.7 02/14/2010 1446   RBC 4.60 06/01/2015 1128   RBC 3.93 01/10/2015 1352   RBC 4.20 02/14/2010 1446   HGB 13.3 06/16/2015 0948   HGB 13.6 06/01/2015 1128   HGB 13.3 02/14/2010 1446   HCT 41.6 06/01/2015 1128   HCT 36.4 01/10/2015 1352   HCT 38.4 02/14/2010 1446   PLT 158 06/01/2015 1128   PLT 108* 01/10/2015 1352   PLT 189 02/14/2010 1446   MCV 90.4 06/01/2015 1128   MCV 92.6 01/10/2015 1352   MCV 91 02/14/2010 1446   MCH 29.7 06/01/2015 1128   MCH 31.0 01/10/2015 1352   MCH 31.6 02/14/2010 1446   MCHC 32.8 06/01/2015 1128   MCHC 33.5 01/10/2015 1352   MCHC 34.6 02/14/2010 1446   RDW 15.0* 06/01/2015 1128   RDW 14.2 01/10/2015 1352   RDW 13.5 02/14/2010 1446   LYMPHSABS 2.4 06/01/2015 1128   LYMPHSABS 1.8 11/08/2014 1016   MONOABS 1.1* 06/01/2015 1128   MONOABS 0.8 11/08/2014 1016   EOSABS 0.2 06/01/2015 1128   EOSABS 0.1 11/08/2014 1016   BASOSABS 0.1 06/01/2015 1128   BASOSABS 0.0 11/08/2014 1016     CMP     Component Value Date/Time   NA 134* 06/16/2015 0948   NA  135* 06/01/2015 1128   NA 137 02/14/2010 1446   K 4.3 06/16/2015 0948   K 4.4 06/01/2015 1128   K 4.1 02/14/2010 1446   CL 103 06/16/2015 0948   CL 96* 02/14/2010 1446   CO2 21* 06/16/2015 0948   CO2 21* 06/01/2015 1128   CO2 26 02/14/2010 1446   GLUCOSE 359* 06/16/2015 0948   GLUCOSE 291* 06/01/2015 1128   GLUCOSE 171* 02/14/2010 1446   BUN 9 06/16/2015 0948   BUN 14.8 06/01/2015 1128   BUN 29* 02/14/2010 1446   CREATININE 0.86 06/16/2015 0948   CREATININE 1.2* 06/01/2015 1128   CREATININE 1.0 02/14/2010 1446   CALCIUM 9.1 06/16/2015 0948   CALCIUM 9.4 06/01/2015 1128   CALCIUM 9.0 02/14/2010 1446   PROT 8.1 06/01/2015 1128   PROT 7.0 01/17/2015 1515   PROT 7.7 02/14/2010 1446   ALBUMIN 3.5 06/01/2015 1128   ALBUMIN 3.4* 01/17/2015 1515   ALBUMIN 3.7 02/14/2010 1446   AST 16 06/01/2015 1128   AST 26 01/17/2015 1515   AST 39* 02/14/2010 1446   ALT 17 06/01/2015 1128   ALT 25 01/17/2015 1515  ALT 44 02/14/2010 1446   ALKPHOS 66 06/01/2015 1128   ALKPHOS 45 01/17/2015 1515   ALKPHOS 42 02/14/2010 1446   BILITOT 0.57 06/01/2015 1128   BILITOT 0.7 01/17/2015 1515   BILITOT 0.40 02/14/2010 1446   GFRNONAA >60 06/16/2015 0948   GFRAA >60 06/16/2015 0948     Impression:  The patient is tolerating radiotherapy.    She had completed Macrobid for a UTI but may have another.   Or, the dysuria is due to radiation effects.  Will check another UA/culture. She is afebrile.  Continue with RT as planned.   -----------------------------------  Eppie Gibson, MD

## 2015-07-20 NOTE — Progress Notes (Signed)
[name] came to nursing today before her radiation complaining of burning with urination. She states it is "burning on the inside". Dr. Isidore Moos spoke with her and requested a urine sample and temperature be taken. Her temperature was 98.6 and she will provide her urine sample after radiation is completed today.   Temp(Src) 98.6 F (37 C)

## 2015-07-21 ENCOUNTER — Ambulatory Visit: Payer: Medicare Other

## 2015-07-21 ENCOUNTER — Ambulatory Visit
Admission: RE | Admit: 2015-07-21 | Discharge: 2015-07-21 | Disposition: A | Discharge status: Home / Self Care | Payer: Medicare Other | Source: Ambulatory Visit | Attending: Radiation Oncology | Admitting: Radiation Oncology

## 2015-07-21 DIAGNOSIS — Z510 Encounter for antineoplastic radiation therapy: Secondary | ICD-10-CM | POA: Diagnosis not present

## 2015-07-21 LAB — URINE CULTURE: ORGANISM ID, BACTERIA: NO GROWTH

## 2015-07-22 ENCOUNTER — Telehealth: Payer: Self-pay

## 2015-07-22 ENCOUNTER — Ambulatory Visit
Admission: RE | Admit: 2015-07-22 | Discharge: 2015-07-22 | Disposition: A | Discharge status: Home / Self Care | Payer: Medicare Other | Source: Ambulatory Visit | Attending: Radiation Oncology | Admitting: Radiation Oncology

## 2015-07-22 ENCOUNTER — Encounter: Payer: Self-pay | Admitting: Radiation Oncology

## 2015-07-22 DIAGNOSIS — Z510 Encounter for antineoplastic radiation therapy: Secondary | ICD-10-CM | POA: Diagnosis not present

## 2015-07-22 NOTE — Progress Notes (Signed)
Ms. [name] had a urinalysis and urine culture performed on 07/20/15 for burning with urination. Her culture has come back with no growth as a final report. The urinalysis showed 2000 for glucose as abnormal. I have showed these results to Bianca Simpson PA. I will call Ms. [name] and let her know about the Glucose in her urine and let her know that the urine culture was negative and there is no need for any antibiotics at this time. These results have been faxed to her primary care doctor by [name] (secretary).

## 2015-07-22 NOTE — Progress Notes (Signed)
Ms. Valero came to nursing today stating that she had a fever. When checked it was 98.0. She stated she had had a fever last night and wanted to let us know. I also informed her of her negative urine culture results and urine glucose of 2000. I informed her that those results have been faxed to her PCP Kelton Pillar. I advised her to call Dr. Delene Ruffini office if she had a questions or concerns. She is excited to finish radiation today.

## 2015-07-23 ENCOUNTER — Ambulatory Visit: Payer: Medicare Other

## 2015-08-01 ENCOUNTER — Ambulatory Visit (HOSPITAL_COMMUNITY): Admission: RE | Admit: 2015-08-01 | Payer: Medicare Other | Source: Ambulatory Visit

## 2015-08-02 ENCOUNTER — Ambulatory Visit (HOSPITAL_COMMUNITY)
Admission: RE | Admit: 2015-08-02 | Discharge: 2015-08-02 | Disposition: A | Discharge status: Home / Self Care | Payer: Medicare Other | Source: Ambulatory Visit | Attending: Gynecologic Oncology | Admitting: Gynecologic Oncology

## 2015-08-02 DIAGNOSIS — R938 Abnormal findings on diagnostic imaging of other specified body structures: Secondary | ICD-10-CM | POA: Diagnosis not present

## 2015-08-02 DIAGNOSIS — N858 Other specified noninflammatory disorders of uterus: Secondary | ICD-10-CM | POA: Insufficient documentation

## 2015-08-02 DIAGNOSIS — C541 Malignant neoplasm of endometrium: Secondary | ICD-10-CM | POA: Insufficient documentation

## 2015-08-04 ENCOUNTER — Telehealth: Payer: Self-pay | Admitting: *Deleted

## 2015-08-04 NOTE — Telephone Encounter (Signed)
Patient called c/o upset stomach and diarrhea from the doxycycline. She said she is allergic to it. She was given phenergan, but it is not helping, please advise. Myrtis Hopping

## 2015-08-05 ENCOUNTER — Other Ambulatory Visit (HOSPITAL_BASED_OUTPATIENT_CLINIC_OR_DEPARTMENT_OTHER): Payer: Medicare Other

## 2015-08-05 ENCOUNTER — Ambulatory Visit: Payer: Medicare Other | Attending: Gynecology | Admitting: Gynecology

## 2015-08-05 ENCOUNTER — Encounter: Payer: Self-pay | Admitting: Gynecology

## 2015-08-05 ENCOUNTER — Telehealth: Payer: Self-pay

## 2015-08-05 VITALS — BP 130/62 | HR 66 | Temp 97.7°F | Resp 18 | Ht 66.0 in | Wt >= 6400 oz

## 2015-08-05 DIAGNOSIS — N189 Chronic kidney disease, unspecified: Secondary | ICD-10-CM | POA: Diagnosis not present

## 2015-08-05 DIAGNOSIS — Z833 Family history of diabetes mellitus: Secondary | ICD-10-CM | POA: Insufficient documentation

## 2015-08-05 DIAGNOSIS — Z86718 Personal history of other venous thrombosis and embolism: Secondary | ICD-10-CM | POA: Diagnosis not present

## 2015-08-05 DIAGNOSIS — C541 Malignant neoplasm of endometrium: Secondary | ICD-10-CM

## 2015-08-05 DIAGNOSIS — R002 Palpitations: Secondary | ICD-10-CM | POA: Diagnosis not present

## 2015-08-05 DIAGNOSIS — J45909 Unspecified asthma, uncomplicated: Secondary | ICD-10-CM | POA: Insufficient documentation

## 2015-08-05 DIAGNOSIS — Z809 Family history of malignant neoplasm, unspecified: Secondary | ICD-10-CM | POA: Diagnosis not present

## 2015-08-05 DIAGNOSIS — E119 Type 2 diabetes mellitus without complications: Secondary | ICD-10-CM | POA: Diagnosis not present

## 2015-08-05 DIAGNOSIS — R2240 Localized swelling, mass and lump, unspecified lower limb: Secondary | ICD-10-CM | POA: Diagnosis not present

## 2015-08-05 DIAGNOSIS — Z803 Family history of malignant neoplasm of breast: Secondary | ICD-10-CM | POA: Insufficient documentation

## 2015-08-05 DIAGNOSIS — F329 Major depressive disorder, single episode, unspecified: Secondary | ICD-10-CM | POA: Insufficient documentation

## 2015-08-05 DIAGNOSIS — M109 Gout, unspecified: Secondary | ICD-10-CM | POA: Diagnosis not present

## 2015-08-05 DIAGNOSIS — G629 Polyneuropathy, unspecified: Secondary | ICD-10-CM | POA: Diagnosis not present

## 2015-08-05 DIAGNOSIS — Z5181 Encounter for therapeutic drug level monitoring: Secondary | ICD-10-CM | POA: Diagnosis not present

## 2015-08-05 DIAGNOSIS — Z923 Personal history of irradiation: Secondary | ICD-10-CM | POA: Insufficient documentation

## 2015-08-05 DIAGNOSIS — F419 Anxiety disorder, unspecified: Secondary | ICD-10-CM | POA: Diagnosis not present

## 2015-08-05 DIAGNOSIS — K219 Gastro-esophageal reflux disease without esophagitis: Secondary | ICD-10-CM | POA: Insufficient documentation

## 2015-08-05 DIAGNOSIS — E785 Hyperlipidemia, unspecified: Secondary | ICD-10-CM | POA: Diagnosis not present

## 2015-08-05 DIAGNOSIS — Z7901 Long term (current) use of anticoagulants: Secondary | ICD-10-CM

## 2015-08-05 DIAGNOSIS — G4733 Obstructive sleep apnea (adult) (pediatric): Secondary | ICD-10-CM | POA: Diagnosis not present

## 2015-08-05 DIAGNOSIS — R162 Hepatomegaly with splenomegaly, not elsewhere classified: Secondary | ICD-10-CM | POA: Diagnosis not present

## 2015-08-05 DIAGNOSIS — I509 Heart failure, unspecified: Secondary | ICD-10-CM | POA: Diagnosis not present

## 2015-08-05 DIAGNOSIS — R300 Dysuria: Secondary | ICD-10-CM | POA: Diagnosis not present

## 2015-08-05 DIAGNOSIS — Z8249 Family history of ischemic heart disease and other diseases of the circulatory system: Secondary | ICD-10-CM | POA: Diagnosis not present

## 2015-08-05 LAB — PROTIME-INR
INR: 3 (ref 2.00–3.50)
PROTIME: 36 s — AB (ref 10.6–13.4)

## 2015-08-05 MED ORDER — PHENAZOPYRIDINE HCL 95 MG PO TABS: 95.0000 mg | ORAL_TABLET | Freq: Three times a day (TID) | ORAL | Status: DC | PRN

## 2015-08-05 NOTE — Patient Instructions (Addendum)
We will call your physician with your PT/INR results from today.  Plan to have another ultrasound before seeing Dr. Fermin Schwab on June 2.  Continue taking Megace as prescribed.  Please call for any questions, concerns, or new symptoms.  Your urine culture from 1/25 was negative for any bacteria growth.  Dr. Fermin Schwab has prescribed pyridium for bladder pain.  Take as needed, up to three times daily.  Phenazopyridine tablets What is this medicine? PHENAZOPYRIDINE (fen az oh PEER i deen) is a pain reliever. It is used to stop the pain, burning, or discomfort caused by infection or irritation of the urinary tract. This medicine is not an antibiotic. It will not cure a urinary tract infection. This medicine may be used for other purposes; ask your health care provider or pharmacist if you have questions. What should I tell my health care provider before I take this medicine? They need to know if you have any of these conditions: -glucose-6-phosphate dehydrogenase (G6PD) deficiency -kidney disease -an unusual or allergic reaction to phenazopyridine, other medicines, foods, dyes, or preservatives -pregnant or trying to get pregnant -breast-feeding How should I use this medicine? Take this medicine by mouth with a glass of water. Follow the directions on the prescription label. Take after meals. Take your doses at regular intervals. Do not take your medicine more often than directed. Do not skip doses or stop your medicine early even if you feel better. Do not stop taking except on your doctor's advice. Talk to your pediatrician regarding the use of this medicine in children. Special care may be needed. Overdosage: If you think you have taken too much of this medicine contact a poison control center or emergency room at once. NOTE: This medicine is only for you. Do not share this medicine with others. What if I miss a dose? If you miss a dose, take it as soon as you can. If it is almost time for  your next dose, take only that dose. Do not take double or extra doses. What may interact with this medicine? Interactions are not expected. This list may not describe all possible interactions. Give your health care provider a list of all the medicines, herbs, non-prescription drugs, or dietary supplements you use. Also tell them if you smoke, drink alcohol, or use illegal drugs. Some items may interact with your medicine. What should I watch for while using this medicine? Tell your doctor or health care professional if your symptoms do not improve or if they get worse. This medicine colors body fluids red. This effect is harmless and will go away after you are done taking the medicine. It will change urine to an dark orange or red color. The red color may stain clothing. Soft contact lenses may become permanently stained. It is best not to wear soft contact lenses while taking this medicine. If you are diabetic you may get a false positive result for sugar in your urine. Talk to your health care provider. What side effects may I notice from receiving this medicine? Side effects that you should report to your doctor or health care professional as soon as possible: -allergic reactions like skin rash, itching or hives, swelling of the face, lips, or tongue -blue or purple color of the skin -difficulty breathing -fever -less urine -unusual bleeding, bruising -unusual tired, weak -vomiting -yellowing of the eyes or skin Side effects that usually do not require medical attention (report to your doctor or health care professional if they continue or are bothersome): -dark  urine -headache -stomach upset This list may not describe all possible side effects. Call your doctor for medical advice about side effects. You may report side effects to FDA at 1-800-FDA-1088. Where should I keep my medicine? Keep out of the reach of children. Store at room temperature between 15 and 30 degrees C (59 and 86  degrees F). Protect from light and moisture. Throw away any unused medicine after the expiration date. NOTE: This sheet is a summary. It may not cover all possible information. If you have questions about this medicine, talk to your doctor, pharmacist, or health care provider.    2016, [employer]. (2008-01-08 11:04:07)

## 2015-08-05 NOTE — Telephone Encounter (Signed)
Patient here today for follow up visit with Dr Fermin Schwab , patient came with request from Dr Kelton Pillar for a PT/INR . Patient had the requested labs drawn , INR 3.00 , faxed [phone] results to Dr Delene Ruffini office . Writer spoke with Arbie Cookey the receptionist , Dr Laurann Montana is out of the office until next week , Elmyra Ricks , RN to be updated that the results are being faxed. Patient contcated and updated as well , no answer , left a message with INR of 3.00 and that Dr Wonda Amis nurse is aware.

## 2015-08-05 NOTE — Progress Notes (Signed)
Consult Note: Gyn-Onc   [name] 55 y.o. female  Chief Complaint  Patient presents with  . 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 She has successfully completed whole pelvis radiation therapy in January 2017.[name] Recent ultrasound shows an 11 mm stripe. Plan:  We will continue our current regimen of oral Megace 80 mg twice a day and the Mirena IUD. We'll repeat an ultrasound to assess endometrial stripe in 3 months. The patient will contact us if she has increasing bleeding. She is given a procedure. Peridium for dysuria (recent urine culture was negative) she will return to see me in 3 months or as needed.  Interval history: Patient returns today as previously scheduled for follow-up. Since her last visit she was found to be a candidate for whole pelvis radiation therapy which she completed without much difficulty. She denies any significant diarrhea. She does have some dysuria. Recent culture showed no growth. Otherwise her functional status is stable to good. She has not had any bleeding since the first week of radiation therapy.  Recent ultrasound showed the uterus was small fibroids and 11 mm endometrial stripe.  . She denies any pelvic pain pressure or any other gynecologic symptoms.   HPI: 55 year old white female seen in consultation request of Dr. Kennis Henry regarding management of a recently diagnosed endometrial carcinoma. The patient initially presented with vaginal bleeding. She has a past history of an endometrial ablation in 2008. Given her morbid obesity and overall habitus it was difficult to examine the patient the office. Subsequently the patient underwent a D&C on 01/17/2015.  The uterus sounded 8 cm.Adenocarcinoma was found on curettings that appeared to be a grade 2. Since the D&C the patient's had minimal spotting. At the time of D&C a Mirena IUD was placed. It is noted that during the D&C the patient's  blood pressure fell she required ICU admission.    Noted the patient had a CT scan of the abdomen and pelvis in April that showed no abnormalities and peritoneal cavity or retroperitoneum.   the patient has a multitude of medical problems as well as morbid obesity and is not a surgical candidate.  I initially recommended the patient be treated with a Mirena IUD, Megace, and radiation therapy. However given her excessive weight radiation therapy was not possible (although the patient could potentially be treated in [place]. The patient does not wish to travel to [facility] however.)  Subsequently the patient lost some weight and was able to receive whole pelvis radiation therapy completed in January 2017. We continued with Megace (oral) and a Mirena IUD.  Review of Systems:10 point review of systems is negative except as noted in interval history.   Vitals: Blood pressure 130/62, pulse 66, temperature 97.7 F (36.5 C), temperature source Oral, resp. rate 18, height 5' 6"  (1.676 m), weight 409 lb 11.2 oz (185.839 kg), SpO2 100 %.  Physical Exam: General : The patient is an morbidly obese white female.  HEENT: normocephalic, extraoccular movements normal; neck is supple without thyromegally       Allergies  Allergen Reactions  . Codeine Other (See Comments)    Heart problems  . Molds & Smuts Shortness Of Breath and Rash  . Allopurinol Other (See Comments)    GI problems   . Ciprofloxacin Other (See Comments)    GI problems  . Doxycycline Other (See Comments)    Gastric problems  . Fexofenadine Other (  See Comments)    Hurts joints/pain  . Keflex [Cephalexin] Diarrhea and Nausea Only  . Nitroglycerin Er Nausea And Vomiting  . Oritavancin Itching    Mild-moderate itching.   . Sulfa Drugs Cross Reactors Nausea And Vomiting  . Accolate [Zafirlukast] Other (See Comments)     rash  . Amoxicillin-Pot Clavulanate Other (See Comments)     GI problems  . Morphine Other (See  Comments)    Nausea and irregular HR(flutter)  . Nystatin Rash  . Septra [Sulfamethoxazole-Trimethoprim] Other (See Comments)    unknown    Past Medical History  Diagnosis Date  . Morbid obesity ([place])   . Depressive disorder, not elsewhere classified   . Asthma   . Dyslipidemia   . Clostridium difficile enterocolitis 2012  . Splenomegaly 06/01/2011  . Thrombocytopenia ([place]) 06/01/2011  . Hepatosplenomegaly   . Gout   . GERD (gastroesophageal reflux disease)   . Osteoarthritis   . Polyneuropathy ([place])   . Urinary incontinence   . Amenorrhea   . CHF (congestive heart failure) ([place])   . Hyperlipidemia   . Leg swelling   . Nausea   . Weakness   . Palpitations   . Anginal pain ([place])     no chest pain in years  . Dysrhythmia     heart palpations  . Anxiety   . Pneumonia   . Shortness of breath   . Chronic kidney disease     overactive bladder  . Peripheral neuropathy ([place])   . DVT (deep venous thrombosis) ([place])   . Endometrial cancer ([place])   . OSA (obstructive sleep apnea)     uses ventilator ( has trach)  . Diabetes mellitus     type 2  . Irritable bowel syndrome   . PONV (postoperative nausea and vomiting)     just nausea    Past Surgical History  Procedure Laterality Date  . Cholecystectomy    . Tracheostomy  2001  . Endometrial ablation    . Skin graft    . Finger surgery      ring finger on right hand  . Esophageal dilation  2003  . Breast biopsy      needle core, left  . Dilation and curettage of uterus      times 2  . Tracheal dilitation  01/24/2012    Procedure: TRACHEAL DILITATION;  Surgeon: Melissa Montane, MD;  Location: [facility];  Service: ENT;  Laterality: N/A;  Trache change with possible dilation, from size 6 to size 7 uncuffed  . Amputation Right 11/19/2013    Procedure: AMPUTATION DIGIT;  Surgeon: Cammie Sickle, MD;  Location: [facility];  Service: Orthopedics;  Laterality: Right;  Partial amputation right long finger, Debridement  right ring finger   . Amputation Right 06/03/2014    Procedure: REVISION AMPUTATION RIGHT RING FINGER;  Surgeon: Daryll Brod, MD;  Location: [facility];  Service: Orthopedics;  Laterality: Right;  . Dilatation & currettage/hysteroscopy with resectocope N/A 01/17/2015    Procedure: DILATATION & CURETTAGE/HYSTEROSCOPY WITH RESECTOCOPE; Pap Smear;  Surgeon: Ena Dawley, MD;  Location: [facility];  Service: Gynecology;  Laterality: N/A;  . Intrauterine device (iud) insertion N/A 01/17/2015    Procedure: INTRAUTERINE DEVICE (IUD) INSERTION;  Surgeon: Ena Dawley, MD;  Location: [facility];  Service: Gynecology;  Laterality: N/A;  . Finger surgery Bilateral 06/16/15    I & D of multiple fingers  . Incision and drainage Left 06/16/2015    Procedure: IRRIGATION AND  DEBRIDEMENT OF LEFT RING FINGER AND LEFT SMALL FINGER.;  Surgeon: Daryll Brod, MD;  Location: [facility];  Service: Orthopedics;  Laterality: Left;  . Dressing change under anesthesia Right 06/16/2015    Procedure: DRESSING CHANGE UNDER ANESTHESIA TO RIGHT THUMB AND RIGHT FIRST FINGER.;  Surgeon: Daryll Brod, MD;  Location: [facility];  Service: Orthopedics;  Laterality: Right;    Current Outpatient Prescriptions  Medication Sig Dispense Refill  . acetaminophen (TYLENOL) 500 MG tablet Take 1,000 mg by mouth every 6 (six) hours as needed for mild pain. For pain    . albuterol (PROVENTIL) (2.5 MG/3ML) 0.083% nebulizer solution Take 2.5 mg by nebulization every 4 (four) hours as needed. For shortness of breath    . ALPRAZolam (XANAX) 0.5 MG tablet Take 0.5 mg by mouth 2 (two) times daily as needed. For anxiety    . atorvastatin (LIPITOR) 10 MG tablet Take 10 mg by mouth at bedtime.     . B Complex Vitamins (VITAMIN B COMPLEX PO) Take 1 tablet by mouth daily.     . budesonide (PULMICORT) 0.25 MG/2ML nebulizer solution Take 0.25 mg by nebulization 2 (two) times daily as needed (shortness of breath).    . bumetanide (BUMEX) 2 MG tablet Take 1  tablet (2 mg total) by mouth daily. May take one extra 27m tab daily as needed for edema. 90 tablet 3  . busPIRone (BUSPAR) 5 MG tablet Take 7.5 mg by mouth 2 (two) times daily.     .Marland KitchenBYSTOLIC 10 MG tablet TAKE 1 TABLET (10 MG TOTAL) BY MOUTH DAILY. 90 tablet 3  . cholestyramine (QUESTRAN) 4 G packet Take 1 packet by mouth as needed. For loose stools, IBS.    . clotrimazole (LOTRIMIN) 1 % cream Apply 1 application topically daily. To stomach    . colchicine 0.6 MG tablet Take 0.6 mg by mouth daily.    . diphenoxylate-atropine (LOMOTIL) 2.5-0.025 MG tablet Take 1-2 tablets by mouth 4 (four) times daily as needed for diarrhea or loose stools. 60 tablet 1  . DULoxetine (CYMBALTA) 30 MG capsule TAKE 1 CAPSULE (30 MG TOTAL) BY MOUTH 2 (TWO) TIMES DAILY. 180 capsule 3  . ergocalciferol (VITAMIN D2) 50000 UNITS capsule Take 50,000 Units by mouth once a week. Take on Fridays    . Febuxostat (ULORIC) 80 MG TABS Take 80 mg by mouth at bedtime.     . fesoterodine (TOVIAZ) 4 MG TB24 Take 4 mg by mouth daily.      .Marland Kitchengabapentin (NEURONTIN) 100 MG capsule TAKE ONE CAPSULE BY MOUTH EVERY MORNING AND 2 CAPS IN THE AFTERNOON AND 2 CAPS AT BEDTIME (Patient taking differently: Take 100 mg by mouth 4 (four) times daily. TAKE ONE CAPSULE BY MOUTH EVERY MORNING AND 1 CAPS AT LUNCH TIME, [facility]) 480 capsule 3  . guaiFENesin (MUCINEX) 600 MG 12 hr tablet Take 600 mg by mouth 2 (two) times daily.     .Marland KitchenHUMULIN R U-500 KWIKPEN 500 UNIT/ML injection INJECT (110 UNITS) BEFORE BREAKFAST AND (50 UNITS) BEFORE EVENING MEAL SUBCUTANEOUSLY TWICE A DAY  0  . HYDROcodone-acetaminophen (NORCO) 5-325 MG tablet Take 1 tablet by mouth every 6 (six) hours as needed for moderate pain. 30 tablet 0  . insulin regular human CONCENTRATED (HUMULIN R) 500 UNIT/ML SOLN injection Inject 40-110 Units into the skin 2 (two) times daily before a meal. Patient injects 110 units am before breakfast and pm 40 units before dinner.     .[name]  INVOKANA 100 MG TABS tablet Take 100 mg by mouth daily.  99  . ipratropium (ATROVENT) 0.02 % nebulizer solution Take 0.5 mg by nebulization 4 (four) times daily as needed for wheezing or shortness of breath.    . ketoconazole (NIZORAL) 2 % cream Apply 1 application topically daily. 15 g 1  . LORazepam (ATIVAN) 0.5 MG tablet Take 1 tablet by mouth 30 minutes before radiotherapy, PRN nausea 12 tablet 0  . losartan (COZAAR) 100 MG tablet TAKE 1 TABLET (100MG) BY MOUTH DAILY. 90 tablet 2  . meclizine (ANTIVERT) 12.5 MG tablet Take 12.5 mg by mouth at bedtime.   6  . megestrol (MEGACE) 40 MG tablet Take 2 tablets (80 mg total) by mouth 3 (three) times daily. 180 tablet 3  . metolazone (ZAROXOLYN) 5 MG tablet TAKE 1 TABLET (5 MG TOTAL) BY MOUTH SEE ADMIN INSTRUCTIONS. 3 TIMES WEEKLY AS NEEDED FOR EDEMA 15 tablet 3  . Multiple Vitamin (MULTIVITAMIN) capsule Take 1 capsule by mouth daily.      . mupirocin ointment (BACTROBAN) 2 % APPLY 1 APPLICATION TO AFFECTED AREA 3 TIMES DAILY AS NEEDED FOR INFECTION  0  . Omega-3 Fatty Acids (OMEGA-3 FISH OIL) 1200 MG CAPS Take 2 capsules (2,400 mg total) by mouth 2 (two) times daily. 120 capsule 6  . omeprazole (PRILOSEC) 40 MG capsule Take 40 mg by mouth 2 (two) times daily.     [name] oxyCODONE-acetaminophen (PERCOCET/ROXICET) 5-325 MG tablet Take 1 tablet 30 minutes before radiation treatments or scans. 5 tablet 0  . potassium chloride SA (K-DUR,KLOR-CON) 20 MEQ tablet Take 20 mEq by mouth 4 (four) times daily. 1 in AM, 2 at lunch, 2 at supper and 1 at bedtime    . primidone (MYSOLINE) 50 MG tablet Take 1 tablet (50 mg total) by mouth 2 (two) times daily. 180 tablet 3  . Probiotic Product (PROBIOTIC PO) Take 1 tablet by mouth daily.     . promethazine (PHENERGAN) 25 MG tablet Take 1 tablet (25 mg total) by mouth every 4 (four) hours as needed for nausea or vomiting. 60 tablet 3  . promethazine-codeine (PHENERGAN WITH CODEINE) 6.25-10 MG/5ML syrup TAKE 1-2  TEASPOONSFUL BY MOUTH EVERY 6 HOURS  0  . warfarin (COUMADIN) 5 MG tablet Take 5 mg by mouth daily at 6 PM.     . fluticasone (FLONASE) 50 MCG/ACT nasal spray Place 1 spray into both nostrils as needed for allergies or rhinitis. Reported on 08/05/2015  3  . VICTOZA 18 MG/3ML SOPN Reported on 08/05/2015  2   No current facility-administered medications for this visit.    Social History   Social History  . Marital Status: Single    Spouse Name: N/A  . Number of Children: 0  . Years of Education: N/A   Occupational History  . disabled    Social History Main Topics  . Smoking status: Never Smoker   . Smokeless tobacco: Never Used  . Alcohol Use: No     Comment: occasional - once or twice a year  . Drug Use: No  . Sexual Activity: Not Currently    Birth Control/ Protection: IUD   Other Topics Concern  . Not on file   Social History Narrative   Lives alone.  She has been on disability since 1999 for morbid obesity.    Single, no children.   She has a caregiver that comes 7-days a week, ~5 hours each day.    Family History  Problem Relation Age of  Onset  . Diabetes Father   . Hypertension Father   . Dementia Father     Deceased, 84  . Pneumonia Father   . Heart disease Mother   . Clotting disorder Mother   . Hypertension Mother   . Heart attack Mother     Deceased, 43  . Multiple myeloma Paternal Grandmother   . Cancer Paternal Grandmother     multiple myoloma  . Heart disease Paternal Grandfather   . Kidney disease Maternal Grandmother   . Hypertension Sister   . Cancer Paternal Aunt     breast  . Cancer Cousin     breast - all three      CLARKE-PEARSON,Advait Buice L, MD 08/05/2015, 9:46 AM

## 2015-08-10 ENCOUNTER — Ambulatory Visit: Payer: Medicare Other | Admitting: Infectious Diseases

## 2015-08-10 ENCOUNTER — Other Ambulatory Visit: Payer: Self-pay | Admitting: Infectious Diseases

## 2015-08-15 ENCOUNTER — Emergency Department (HOSPITAL_COMMUNITY): Payer: Medicare Other

## 2015-08-15 ENCOUNTER — Inpatient Hospital Stay (HOSPITAL_COMMUNITY)
Admission: EM | Admit: 2015-08-15 | Discharge: 2015-08-19 | DRG: 638 | Disposition: A | Discharge status: Home / Self Care | Payer: Medicare Other | Attending: Internal Medicine | Admitting: Internal Medicine

## 2015-08-15 ENCOUNTER — Encounter (HOSPITAL_COMMUNITY): Payer: Self-pay | Admitting: Emergency Medicine

## 2015-08-15 DIAGNOSIS — Z888 Allergy status to other drugs, medicaments and biological substances status: Secondary | ICD-10-CM

## 2015-08-15 DIAGNOSIS — Z6841 Body Mass Index (BMI) 40.0 and over, adult: Secondary | ICD-10-CM | POA: Diagnosis not present

## 2015-08-15 DIAGNOSIS — Z885 Allergy status to narcotic agent status: Secondary | ICD-10-CM | POA: Diagnosis not present

## 2015-08-15 DIAGNOSIS — E782 Mixed hyperlipidemia: Secondary | ICD-10-CM | POA: Diagnosis present

## 2015-08-15 DIAGNOSIS — E1310 Other specified diabetes mellitus with ketoacidosis without coma: Principal | ICD-10-CM | POA: Diagnosis present

## 2015-08-15 DIAGNOSIS — Z794 Long term (current) use of insulin: Secondary | ICD-10-CM | POA: Diagnosis not present

## 2015-08-15 DIAGNOSIS — Z807 Family history of other malignant neoplasms of lymphoid, hematopoietic and related tissues: Secondary | ICD-10-CM

## 2015-08-15 DIAGNOSIS — E662 Morbid (severe) obesity with alveolar hypoventilation: Secondary | ICD-10-CM | POA: Diagnosis present

## 2015-08-15 DIAGNOSIS — Z22322 Carrier or suspected carrier of Methicillin resistant Staphylococcus aureus: Secondary | ICD-10-CM | POA: Diagnosis not present

## 2015-08-15 DIAGNOSIS — Z930 Tracheostomy status: Secondary | ICD-10-CM

## 2015-08-15 DIAGNOSIS — K580 Irritable bowel syndrome with diarrhea: Secondary | ICD-10-CM | POA: Diagnosis present

## 2015-08-15 DIAGNOSIS — M109 Gout, unspecified: Secondary | ICD-10-CM | POA: Diagnosis present

## 2015-08-15 DIAGNOSIS — Z881 Allergy status to other antibiotic agents status: Secondary | ICD-10-CM

## 2015-08-15 DIAGNOSIS — N3281 Overactive bladder: Secondary | ICD-10-CM | POA: Diagnosis present

## 2015-08-15 DIAGNOSIS — J9503 Malfunction of tracheostomy stoma: Secondary | ICD-10-CM | POA: Diagnosis present

## 2015-08-15 DIAGNOSIS — E876 Hypokalemia: Secondary | ICD-10-CM | POA: Diagnosis present

## 2015-08-15 DIAGNOSIS — I110 Hypertensive heart disease with heart failure: Secondary | ICD-10-CM | POA: Diagnosis present

## 2015-08-15 DIAGNOSIS — Z833 Family history of diabetes mellitus: Secondary | ICD-10-CM

## 2015-08-15 DIAGNOSIS — E669 Obesity, unspecified: Secondary | ICD-10-CM | POA: Diagnosis not present

## 2015-08-15 DIAGNOSIS — R739 Hyperglycemia, unspecified: Secondary | ICD-10-CM | POA: Diagnosis present

## 2015-08-15 DIAGNOSIS — I10 Essential (primary) hypertension: Secondary | ICD-10-CM

## 2015-08-15 DIAGNOSIS — M86641 Other chronic osteomyelitis, right hand: Secondary | ICD-10-CM | POA: Diagnosis present

## 2015-08-15 DIAGNOSIS — Z880 Allergy status to penicillin: Secondary | ICD-10-CM | POA: Diagnosis not present

## 2015-08-15 DIAGNOSIS — C541 Malignant neoplasm of endometrium: Secondary | ICD-10-CM | POA: Diagnosis present

## 2015-08-15 DIAGNOSIS — L02511 Cutaneous abscess of right hand: Secondary | ICD-10-CM | POA: Diagnosis present

## 2015-08-15 DIAGNOSIS — E1142 Type 2 diabetes mellitus with diabetic polyneuropathy: Secondary | ICD-10-CM | POA: Diagnosis present

## 2015-08-15 DIAGNOSIS — Z86718 Personal history of other venous thrombosis and embolism: Secondary | ICD-10-CM

## 2015-08-15 DIAGNOSIS — Z882 Allergy status to sulfonamides status: Secondary | ICD-10-CM | POA: Diagnosis not present

## 2015-08-15 DIAGNOSIS — E119 Type 2 diabetes mellitus without complications: Secondary | ICD-10-CM | POA: Diagnosis not present

## 2015-08-15 DIAGNOSIS — Z7901 Long term (current) use of anticoagulants: Secondary | ICD-10-CM

## 2015-08-15 DIAGNOSIS — E0810 Diabetes mellitus due to underlying condition with ketoacidosis without coma: Secondary | ICD-10-CM

## 2015-08-15 DIAGNOSIS — E1110 Type 2 diabetes mellitus with ketoacidosis without coma: Secondary | ICD-10-CM | POA: Diagnosis present

## 2015-08-15 DIAGNOSIS — N179 Acute kidney failure, unspecified: Secondary | ICD-10-CM | POA: Diagnosis present

## 2015-08-15 DIAGNOSIS — I5032 Chronic diastolic (congestive) heart failure: Secondary | ICD-10-CM | POA: Diagnosis present

## 2015-08-15 DIAGNOSIS — M869 Osteomyelitis, unspecified: Secondary | ICD-10-CM | POA: Diagnosis present

## 2015-08-15 DIAGNOSIS — Z8249 Family history of ischemic heart disease and other diseases of the circulatory system: Secondary | ICD-10-CM | POA: Diagnosis not present

## 2015-08-15 DIAGNOSIS — E1169 Type 2 diabetes mellitus with other specified complication: Secondary | ICD-10-CM

## 2015-08-15 LAB — BASIC METABOLIC PANEL
ANION GAP: 16 — AB (ref 5–15)
ANION GAP: 18 — AB (ref 5–15)
BUN: 13 mg/dL (ref 6–20)
BUN: 13 mg/dL (ref 6–20)
CALCIUM: 9 mg/dL (ref 8.9–10.3)
CALCIUM: 8.9 mg/dL (ref 8.9–10.3)
CHLORIDE: 87 mmol/L — AB (ref 101–111)
CO2: 24 mmol/L (ref 22–32)
CO2: 20 mmol/L — ABNORMAL LOW (ref 22–32)
CREATININE: 0.94 mg/dL (ref 0.44–1.00)
CREATININE: 0.97 mg/dL (ref 0.44–1.00)
Chloride: 91 mmol/L — ABNORMAL LOW (ref 101–111)
GFR calc non Af Amer: >60 mL/min (ref >60)
GFR calc non Af Amer: >60 mL/min (ref >60)
Glucose, Bld: 592 mg/dL (ref 65–99)
Glucose, Bld: 485 mg/dL — ABNORMAL HIGH (ref 65–99)
Potassium: 3.7 mmol/L (ref 3.5–5.1)
Potassium: 3.5 mmol/L (ref 3.5–5.1)
SODIUM: 127 mmol/L — AB (ref 135–145)
SODIUM: 129 mmol/L — AB (ref 135–145)

## 2015-08-15 LAB — COMPREHENSIVE METABOLIC PANEL
ALT: 25 U/L (ref 14–54)
AST: 29 U/L (ref 15–41)
Albumin: 3.4 g/dL — ABNORMAL LOW (ref 3.5–5.0)
Alkaline Phosphatase: 75 U/L (ref 38–126)
Anion gap: 16 — ABNORMAL HIGH (ref 5–15)
BILIRUBIN TOTAL: 0.9 mg/dL (ref 0.3–1.2)
BUN: 14 mg/dL (ref 6–20)
CO2: 25 mmol/L (ref 22–32)
Calcium: 9.4 mg/dL (ref 8.9–10.3)
Chloride: 84 mmol/L — ABNORMAL LOW (ref 101–111)
Creatinine, Ser: 1.31 mg/dL — ABNORMAL HIGH (ref 0.44–1.00)
GFR calc Af Amer: 53 mL/min — ABNORMAL LOW (ref >60)
GFR, EST NON AFRICAN AMERICAN: 46 mL/min — AB (ref >60)
Glucose, Bld: 790 mg/dL (ref 65–99)
POTASSIUM: 3.4 mmol/L — AB (ref 3.5–5.1)
Sodium: 125 mmol/L — ABNORMAL LOW (ref 135–145)
TOTAL PROTEIN: 7.8 g/dL (ref 6.5–8.1)

## 2015-08-15 LAB — URINALYSIS, ROUTINE W REFLEX MICROSCOPIC
BILIRUBIN URINE: NEGATIVE
HGB URINE DIPSTICK: NEGATIVE
KETONES UR: NEGATIVE mg/dL
Leukocytes, UA: NEGATIVE
Nitrite: NEGATIVE
PH: 6 (ref 5.0–8.0)
Protein, ur: NEGATIVE mg/dL
Specific Gravity, Urine: 1.033 — ABNORMAL HIGH (ref 1.005–1.030)

## 2015-08-15 LAB — URINE MICROSCOPIC-ADD ON

## 2015-08-15 LAB — CBC WITH DIFFERENTIAL/PLATELET
BASOS ABS: 0 10*3/uL (ref 0.0–0.1)
Basophils Relative: 0 %
EOS PCT: 2 %
Eosinophils Absolute: 0.1 10*3/uL (ref 0.0–0.7)
HEMATOCRIT: 38.9 % (ref 36.0–46.0)
Hemoglobin: 13.8 g/dL (ref 12.0–15.0)
LYMPHS ABS: 1 10*3/uL (ref 0.7–4.0)
LYMPHS PCT: 19 %
MCH: 31.2 pg (ref 26.0–34.0)
MCHC: 35.5 g/dL (ref 30.0–36.0)
MCV: 87.8 fL (ref 78.0–100.0)
MONO ABS: 0.7 10*3/uL (ref 0.1–1.0)
Monocytes Relative: 13 %
Neutro Abs: 3.6 10*3/uL (ref 1.7–7.7)
Neutrophils Relative %: 66 %
Platelets: 147 10*3/uL — ABNORMAL LOW (ref 150–400)
RBC: 4.43 MIL/uL (ref 3.87–5.11)
RDW: 14.5 % (ref 11.5–15.5)
WBC: 5.4 10*3/uL (ref 4.0–10.5)

## 2015-08-15 LAB — BRAIN NATRIURETIC PEPTIDE: B NATRIURETIC PEPTIDE 5: 46.8 pg/mL (ref 0.0–100.0)

## 2015-08-15 LAB — I-STAT TROPONIN, ED: Troponin i, poc: 0.03 ng/mL (ref 0.00–0.08)

## 2015-08-15 LAB — CBG MONITORING, ED
Glucose-Capillary: >600 mg/dL (ref 65–99)
Glucose-Capillary: 527 mg/dL — ABNORMAL HIGH (ref 65–99)
Glucose-Capillary: 514 mg/dL — ABNORMAL HIGH (ref 65–99)
Glucose-Capillary: 451 mg/dL — ABNORMAL HIGH (ref 65–99)
Glucose-Capillary: 416 mg/dL — ABNORMAL HIGH (ref 65–99)
Glucose-Capillary: 378 mg/dL — ABNORMAL HIGH (ref 65–99)
Glucose-Capillary: 351 mg/dL — ABNORMAL HIGH (ref 65–99)

## 2015-08-15 LAB — I-STAT VENOUS BLOOD GAS, ED
ACID-BASE EXCESS: 5 mmol/L — AB (ref 0.0–2.0)
BICARBONATE: 31.4 meq/L — AB (ref 20.0–24.0)
O2 Saturation: 55 %
PH VEN: 7.38 — AB (ref 7.250–7.300)
TCO2: 33 mmol/L (ref 0–100)
pCO2, Ven: 53.2 mmHg — ABNORMAL HIGH (ref 45.0–50.0)
pO2, Ven: 30 mmHg (ref 30.0–45.0)

## 2015-08-15 LAB — PROTIME-INR
INR: 3.8 — ABNORMAL HIGH (ref 0.00–1.49)
Prothrombin Time: 36.5 seconds — ABNORMAL HIGH (ref 11.6–15.2)

## 2015-08-15 MED ORDER — NEBIVOLOL HCL 10 MG PO TABS
10.0000 mg | ORAL_TABLET | Freq: Every day | ORAL | Status: DC
  Administered 2015-08-15 – 2015-08-19 (×5): 10 mg via ORAL
  Filled 2015-08-15 (×5): qty 1

## 2015-08-15 MED ORDER — GABAPENTIN 100 MG PO CAPS
100.0000 mg | ORAL_CAPSULE | Freq: Four times a day (QID) | ORAL | Status: DC
  Administered 2015-08-15 – 2015-08-19 (×15): 100 mg via ORAL
  Filled 2015-08-15 (×15): qty 1

## 2015-08-15 MED ORDER — POLYETHYLENE GLYCOL 3350 17 G PO PACK
17.0000 g | PACK | Freq: Every day | ORAL | Status: DC | PRN
  Filled 2015-08-15: qty 1

## 2015-08-15 MED ORDER — SODIUM CHLORIDE 0.9 % IV BOLUS (SEPSIS)
1000.0000 mL | Freq: Once | INTRAVENOUS | Status: AC
  Administered 2015-08-15: 1000 mL via INTRAVENOUS

## 2015-08-15 MED ORDER — BUSPIRONE HCL 15 MG PO TABS
7.5000 mg | ORAL_TABLET | Freq: Two times a day (BID) | ORAL | Status: DC
  Administered 2015-08-15 – 2015-08-19 (×8): 7.5 mg via ORAL
  Filled 2015-08-15 (×8): qty 1

## 2015-08-15 MED ORDER — COLCHICINE 0.6 MG PO TABS
0.6000 mg | ORAL_TABLET | Freq: Every day | ORAL | Status: DC
  Administered 2015-08-15 – 2015-08-19 (×5): 0.6 mg via ORAL
  Filled 2015-08-15 (×5): qty 1

## 2015-08-15 MED ORDER — SODIUM CHLORIDE 0.9 % IV SOLN
INTRAVENOUS | Status: DC
  Administered 2015-08-15: 5.4 [IU]/h via INTRAVENOUS
  Filled 2015-08-15: qty 2.5

## 2015-08-15 MED ORDER — SODIUM CHLORIDE 0.9 % IV SOLN
INTRAVENOUS | Status: AC
Start: 2015-08-15 — End: 2015-08-15
  Administered 2015-08-15: 17:00:00 via INTRAVENOUS

## 2015-08-15 MED ORDER — INSULIN NPH (HUMAN) (ISOPHANE) 100 UNIT/ML ~~LOC~~ SUSP
10.0000 [IU] | Freq: Once | SUBCUTANEOUS | Status: AC
  Administered 2015-08-15: 10 [IU] via SUBCUTANEOUS
  Filled 2015-08-15: qty 10

## 2015-08-15 MED ORDER — LOSARTAN POTASSIUM 50 MG PO TABS
100.0000 mg | ORAL_TABLET | Freq: Every day | ORAL | Status: DC
  Administered 2015-08-15 – 2015-08-19 (×5): 100 mg via ORAL
  Filled 2015-08-15 (×5): qty 2

## 2015-08-15 MED ORDER — ALPRAZOLAM 0.5 MG PO TABS: 0.5000 mg | ORAL_TABLET | Freq: Two times a day (BID) | ORAL | Status: DC | PRN

## 2015-08-15 MED ORDER — SODIUM CHLORIDE 0.9 % IV SOLN
INTRAVENOUS | Status: DC
  Administered 2015-08-15 – 2015-08-16 (×2): via INTRAVENOUS

## 2015-08-15 MED ORDER — SODIUM CHLORIDE 0.9% FLUSH
3.0000 mL | Freq: Two times a day (BID) | INTRAVENOUS | Status: DC
  Administered 2015-08-15 – 2015-08-19 (×8): 3 mL via INTRAVENOUS

## 2015-08-15 MED ORDER — ATORVASTATIN CALCIUM 10 MG PO TABS
10.0000 mg | ORAL_TABLET | Freq: Every day | ORAL | Status: DC
  Administered 2015-08-16 – 2015-08-18 (×3): 10 mg via ORAL
  Filled 2015-08-15 (×3): qty 1

## 2015-08-15 MED ORDER — BUDESONIDE 0.25 MG/2ML IN SUSP: 0.2500 mg | Freq: Two times a day (BID) | RESPIRATORY_TRACT | Status: DC | PRN

## 2015-08-15 MED ORDER — DEXTROSE-NACL 5-0.45 % IV SOLN
INTRAVENOUS | Status: DC
  Administered 2015-08-16: 01:00:00 via INTRAVENOUS

## 2015-08-15 MED ORDER — POTASSIUM CHLORIDE 10 MEQ/100ML IV SOLN
10.0000 meq | INTRAVENOUS | Status: AC
  Administered 2015-08-15 (×3): 10 meq via INTRAVENOUS
  Filled 2015-08-15 (×3): qty 100

## 2015-08-15 MED ORDER — ACETAMINOPHEN 650 MG RE SUPP: 650.0000 mg | Freq: Four times a day (QID) | RECTAL | Status: DC | PRN

## 2015-08-15 MED ORDER — ALBUTEROL SULFATE (2.5 MG/3ML) 0.083% IN NEBU: 2.5000 mg | INHALATION_SOLUTION | RESPIRATORY_TRACT | Status: DC | PRN

## 2015-08-15 MED ORDER — DULOXETINE HCL 30 MG PO CPEP
30.0000 mg | ORAL_CAPSULE | Freq: Two times a day (BID) | ORAL | Status: DC
  Administered 2015-08-15 – 2015-08-19 (×8): 30 mg via ORAL
  Filled 2015-08-15 (×8): qty 1

## 2015-08-15 MED ORDER — WARFARIN - PHARMACIST DOSING INPATIENT
Freq: Every day | Status: DC
  Administered 2015-08-18: 18:00:00

## 2015-08-15 MED ORDER — ACETAMINOPHEN 325 MG PO TABS
650.0000 mg | ORAL_TABLET | Freq: Four times a day (QID) | ORAL | Status: DC | PRN
  Administered 2015-08-19: 650 mg via ORAL
  Filled 2015-08-15: qty 2

## 2015-08-15 NOTE — ED Notes (Signed)
Pt transporting to xray. NAD.  

## 2015-08-15 NOTE — ED Notes (Signed)
Pt to ER via [facility] from home with complaint of shortness of breath with and without exertion. Pt has hx of CHF, tracheostomy present on arrival. Pt sats 98% on RA. Denies pain. Pt reports dry cough x5 days without fever. Pt also reports nausea. VS - BP 146/72, HR 80 NSR, RR 18.

## 2015-08-15 NOTE — ED Notes (Signed)
Checked pt CBG. CBG monitor read high. Nurse was notified.

## 2015-08-15 NOTE — Progress Notes (Signed)
ANTICOAGULATION CONSULT NOTE - Initial Consult  Pharmacy Consult for Warfarin Indication: Hx DVT  Allergies  Allergen Reactions  . Codeine Other (See Comments)    Heart problems  . Molds & Smuts Shortness Of Breath and Rash  . Allopurinol Other (See Comments)    GI problems   . Ciprofloxacin Other (See Comments)    GI problems  . Doxycycline Other (See Comments)    Gastric problems  . Fexofenadine Other (See Comments)    Hurts joints/pain  . Keflex [Cephalexin] Diarrhea and Nausea Only  . Nitroglycerin Er Nausea And Vomiting  . Oritavancin Itching    Mild-moderate itching.   . Sulfa Drugs Cross Reactors Nausea And Vomiting  . Accolate [Zafirlukast] Other (See Comments)     rash  . Amoxicillin-Pot Clavulanate Other (See Comments)     GI problems  . Morphine Other (See Comments)    Nausea and irregular HR(flutter)  . Nystatin Rash  . Septra [Sulfamethoxazole-Trimethoprim] Other (See Comments)    unknown    Patient Measurements:    Vital Signs: Temp: 98.2 F (36.8 C) (02/20 1105) Temp Source: Oral (02/20 1105) BP: 134/82 mmHg (02/20 1545) Pulse Rate: 71 (02/20 1545)  Labs:  Recent Labs  08/15/15 1238  HGB 13.8  HCT 38.9  PLT 147*  LABPROT 36.5*  INR 3.80*  CREATININE 1.31*    Estimated Creatinine Clearance: 86.2 mL/min (by C-G formula based on Cr of 1.31).   Medical History: Past Medical History  Diagnosis Date  . Morbid obesity ([place])   . Depressive disorder, not elsewhere classified   . Asthma   . Dyslipidemia   . Clostridium difficile enterocolitis 2012  . Splenomegaly 06/01/2011  . Thrombocytopenia ([place]) 06/01/2011  . Hepatosplenomegaly   . Gout   . GERD (gastroesophageal reflux disease)   . Osteoarthritis   . Polyneuropathy ([place])   . Urinary incontinence   . Amenorrhea   . CHF (congestive heart failure) ([place])   . Hyperlipidemia   . Leg swelling   . Nausea   . Weakness   . Palpitations   . Anginal pain ([place])     no chest pain in years   . Dysrhythmia     heart palpations  . Anxiety   . Pneumonia   . Shortness of breath   . Chronic kidney disease     overactive bladder  . Peripheral neuropathy ([place])   . DVT (deep venous thrombosis) ([place])   . Endometrial cancer ([place])   . OSA (obstructive sleep apnea)     uses ventilator ( has trach)  . Diabetes mellitus     type 2  . Irritable bowel syndrome   . PONV (postoperative nausea and vomiting)     just nausea    Assessment: [name] who presented to the [facility] on 2/20 with SOB. The patient has a chronic trach and was also found to have DKA. Pharmacy was consulted to resume warfarin from PTA for hx DVT.  The patient's INR on admit was SUPRAtherapeutic (INR 3.8, goal of 2-3) on PTA dose of 2.5 mg daily - last dose was on 2/19. Hgb/Hct wnl, plts 147 - no active signs/symptoms of bleeding noted.   The patient was re-educated on warfarin today.   Goal of Therapy:  INR 2-3   Plan:  1. Hold warfarin dose today 2. Daily PT/INR 3. Will continue to monitor for any signs/symptoms of bleeding and will follow up with PT/INR in the a.m.   Alycia Rossetti, PharmD, BCPS Clinical Pharmacist  Pager: [phone] 08/15/2015 4:55 PM

## 2015-08-15 NOTE — ED Notes (Signed)
CRITICAL VALUE ALERT  Critical value received:  Glucose 790  Date of notification: 08/15/2015  Time of notification:  [id number]  Critical value read back:yes  Nurse who received alert:  Earleen Newport RN   MD notified: MD Leonette Monarch

## 2015-08-15 NOTE — ED Notes (Signed)
Placed pt on bedside toilet. 

## 2015-08-15 NOTE — H&P (Signed)
[facility] History and Physical  [name] OFB:[id number] DOB: 30-Apr-1961 DOA: 08/15/2015  Referring physician: Emergency Department PCP: [name], MD  Specialists:   Chief Complaint: sob  HPI: [name] is a 55 y.o. female with a hx of chronic diastolic chf, HTN, morbid osbesity, OSA, trach dependence, DM on insulin who presented with complaints of subjective sob. O2 sats were normal. CXR clear. Work up demonstrated glucose of 790 with anion gap of 16. Patient was given 10 units of insulin subQ with fluid bolus and hospitalist consulted for admission.  Review of Systems:  Review of Systems  Constitutional: Negative for chills, weight loss and malaise/fatigue.  HENT: Negative for ear discharge, ear pain and tinnitus.   Eyes: Negative for pain, discharge and redness.  Respiratory: Positive for shortness of breath. Negative for hemoptysis and sputum production.   Cardiovascular: Negative for chest pain, palpitations and orthopnea.  Gastrointestinal: Negative for vomiting and abdominal pain.  Genitourinary: Negative for urgency and frequency.  Musculoskeletal: Negative for joint pain and falls.  Neurological: Negative for tingling, tremors, seizures and loss of consciousness.  Psychiatric/Behavioral: Negative for hallucinations and memory loss.     Past Medical History  Diagnosis Date  . Morbid obesity ([place])   . Depressive disorder, not elsewhere classified   . Asthma   . Dyslipidemia   . Clostridium difficile enterocolitis 2012  . Splenomegaly 06/01/2011  . Thrombocytopenia ([place]) 06/01/2011  . Hepatosplenomegaly   . Gout   . GERD (gastroesophageal reflux disease)   . Osteoarthritis   . Polyneuropathy ([place])   . Urinary incontinence   . Amenorrhea   . CHF (congestive heart failure) ([place])   . Hyperlipidemia   . Leg swelling   . Nausea   . Weakness   . Palpitations   . Anginal pain ([place])     no chest pain in years  . Dysrhythmia     heart  palpations  . Anxiety   . Pneumonia   . Shortness of breath   . Chronic kidney disease     overactive bladder  . Peripheral neuropathy ([place])   . DVT (deep venous thrombosis) ([place])   . Endometrial cancer ([place])   . OSA (obstructive sleep apnea)     uses ventilator ( has trach)  . Diabetes mellitus     type 2  . Irritable bowel syndrome   . PONV (postoperative nausea and vomiting)     just nausea   Past Surgical History  Procedure Laterality Date  . Cholecystectomy    . Tracheostomy  2001  . Endometrial ablation    . Skin graft    . Finger surgery      ring finger on right hand  . Esophageal dilation  2003  . Breast biopsy      needle core, left  . Dilation and curettage of uterus      times 2  . Tracheal dilitation  01/24/2012    Procedure: TRACHEAL DILITATION;  Surgeon: Melissa Montane, MD;  Location: [facility];  Service: ENT;  Laterality: N/A;  Trache change with possible dilation, from size 6 to size 7 uncuffed  . Amputation Right 11/19/2013    Procedure: AMPUTATION DIGIT;  Surgeon: Cammie Sickle, MD;  Location: [facility];  Service: Orthopedics;  Laterality: Right;  Partial amputation right long finger, Debridement right ring finger   . Amputation Right 06/03/2014    Procedure: REVISION AMPUTATION RIGHT RING FINGER;  Surgeon: Daryll Brod, MD;  Location: [facility];  Service: Orthopedics;  Laterality: Right;  . Dilatation & currettage/hysteroscopy with resectocope N/A 01/17/2015    Procedure: DILATATION & CURETTAGE/HYSTEROSCOPY WITH RESECTOCOPE; Pap Smear;  Surgeon: Ena Dawley, MD;  Location: [facility];  Service: Gynecology;  Laterality: N/A;  . Intrauterine device (iud) insertion N/A 01/17/2015    Procedure: INTRAUTERINE DEVICE (IUD) INSERTION;  Surgeon: Ena Dawley, MD;  Location: [facility];  Service: Gynecology;  Laterality: N/A;  . Finger surgery Bilateral 06/16/15    I & D of multiple fingers  . Incision and drainage Left 06/16/2015     Procedure: IRRIGATION AND DEBRIDEMENT OF LEFT RING FINGER AND LEFT SMALL FINGER.;  Surgeon: Daryll Brod, MD;  Location: [facility];  Service: Orthopedics;  Laterality: Left;  . Dressing change under anesthesia Right 06/16/2015    Procedure: DRESSING CHANGE UNDER ANESTHESIA TO RIGHT THUMB AND RIGHT FIRST FINGER.;  Surgeon: Daryll Brod, MD;  Location: [facility];  Service: Orthopedics;  Laterality: Right;   Social History:  reports that she has never smoked. She has never used smokeless tobacco. She reports that she does not drink alcohol or use illicit drugs.  where does patient live--home, ALF, SNF? and with whom if at home?  Can patient participate in ADLs?  Allergies  Allergen Reactions  . Codeine Other (See Comments)    Heart problems  . Molds & Smuts Shortness Of Breath and Rash  . Allopurinol Other (See Comments)    GI problems   . Ciprofloxacin Other (See Comments)    GI problems  . Doxycycline Other (See Comments)    Gastric problems  . Fexofenadine Other (See Comments)    Hurts joints/pain  . Keflex [Cephalexin] Diarrhea and Nausea Only  . Nitroglycerin Er Nausea And Vomiting  . Oritavancin Itching    Mild-moderate itching.   . Sulfa Drugs Cross Reactors Nausea And Vomiting  . Accolate [Zafirlukast] Other (See Comments)     rash  . Amoxicillin-Pot Clavulanate Other (See Comments)     GI problems  . Morphine Other (See Comments)    Nausea and irregular HR(flutter)  . Nystatin Rash  . Septra [Sulfamethoxazole-Trimethoprim] Other (See Comments)    unknown    Family History  Problem Relation Age of Onset  . Diabetes Father   . Hypertension Father   . Dementia Father     Deceased, 71  . Pneumonia Father   . Heart disease Mother   . Clotting disorder Mother   . Hypertension Mother   . Heart attack Mother     Deceased, 5  . Multiple myeloma Paternal Grandmother   . Cancer Paternal Grandmother     multiple myoloma  . Heart disease Paternal Grandfather   . Kidney disease  Maternal Grandmother   . Hypertension Sister   . Cancer Paternal Aunt     breast  . Cancer Cousin     breast - all three     Prior to Admission medications   Medication Sig Start Date End Date Taking? Authorizing Provider  acetaminophen (TYLENOL) 500 MG tablet Take 1,000 mg by mouth every 6 (six) hours as needed for mild pain. For pain   Yes Historical Provider, MD  albuterol (PROVENTIL) (2.5 MG/3ML) 0.083% nebulizer solution Take 2.5 mg by nebulization every 4 (four) hours as needed. For shortness of breath 05/28/11  Yes Chesley Mires, MD  ALPRAZolam Duanne Moron) 0.5 MG tablet Take 0.5 mg by mouth 2 (two) times daily as needed. For anxiety   Yes Historical Provider, MD  atorvastatin (LIPITOR) 10  MG tablet Take 10 mg by mouth at bedtime.    Yes Historical Provider, MD  B Complex Vitamins (VITAMIN B COMPLEX PO) Take 1 tablet by mouth daily.    Yes Historical Provider, MD  budesonide (PULMICORT) 0.25 MG/2ML nebulizer solution Take 0.25 mg by nebulization 2 (two) times daily as needed (shortness of breath).   Yes Historical Provider, MD  bumetanide (BUMEX) 2 MG tablet Take 1 tablet (2 mg total) by mouth daily. May take one extra 60m tab daily as needed for edema. 10/07/14  Yes JJettie Booze MD  busPIRone (BUSPAR) 5 MG tablet Take 7.5 mg by mouth 2 (two) times daily.    Yes Historical Provider, MD  BYSTOLIC 10 MG tablet TAKE 1 TABLET (10 MG TOTAL) BY MOUTH DAILY. 09/13/14  Yes JJettie Booze MD  cholestyramine (Lucrezia Starch 4 G packet Take 1 packet by mouth as needed. For loose stools, IBS.   Yes Historical Provider, MD  clotrimazole (LOTRIMIN) 1 % cream Apply 1 application topically daily. To stomach   Yes Historical Provider, MD  colchicine 0.6 MG tablet Take 0.6 mg by mouth daily.   Yes Historical Provider, MD  diphenoxylate-atropine (LOMOTIL) 2.5-0.025 MG tablet Take 1-2 tablets by mouth 4 (four) times daily as needed for diarrhea or loose stools. 07/18/15  Yes SEppie Gibson MD  DULoxetine  (CYMBALTA) 30 MG capsule TAKE 1 CAPSULE (30 MG TOTAL) BY MOUTH 2 (TWO) TIMES DAILY. 05/11/15  Yes Donika KKeith Rake DO  ergocalciferol (VITAMIN D2) 50000 UNITS capsule Take 50,000 Units by mouth once a week. Take on Fridays   Yes Historical Provider, MD  Febuxostat (ULORIC) 80 MG TABS Take 80 mg by mouth at bedtime.    Yes Historical Provider, MD  fesoterodine (TOVIAZ) 4 MG TB24 Take 4 mg by mouth daily.     Yes Historical Provider, MD  fluticasone (FLONASE) 50 MCG/ACT nasal spray Place 1 spray into both nostrils as needed for allergies or rhinitis. Reported on 08/05/2015 10/04/14  Yes Historical Provider, MD  gabapentin (NEURONTIN) 100 MG capsule TAKE ONE CAPSULE BY MOUTH EVERY MORNING AND 2 CAPS IN THE AFTERNOON AND 2 CAPS AT BEDTIME Patient taking differently: Take 100 mg by mouth 4 (four) times daily. TAKE ONE CAPSULE BY MOUTH EVERY MORNING AND 1 CAPS AT LUNCH TIME, [facility] AT [facility] 05/13/14  Yes Donika K Patel, DO  guaiFENesin (MUCINEX) 600 MG 12 hr tablet Take 600 mg by mouth 2 (two) times daily.    Yes Historical Provider, MD  HUMULIN R U-500 KWIKPEN 500 UNIT/ML injection INJECT (110 UNITS) BEFORE BREAKFAST AND (50 UNITS) BEFORE EVENING MEAL SUBCUTANEOUSLY TWICE A DAY 03/02/15  Yes Historical Provider, MD  HYDROcodone-acetaminophen (NORCO) 5-325 MG tablet Take 1 tablet by mouth every 6 (six) hours as needed for moderate pain. 06/16/15  Yes GDaryll Brod MD  INVOKANA 100 MG TABS tablet Take 100 mg by mouth daily. 03/02/15  Yes Historical Provider, MD  ipratropium (ATROVENT) 0.02 % nebulizer solution Take 0.5 mg by nebulization 4 (four) times daily as needed for wheezing or shortness of breath.   Yes Historical Provider, MD  ketoconazole (NIZORAL) 2 % cream Apply 1 application topically daily. 04/13/15  Yes VDonnel Saxon CNM  LORazepam (ATIVAN) 0.5 MG tablet Take 1 tablet by mouth 30 minutes before radiotherapy, PRN nausea 07/11/15  Yes SEppie Gibson MD  losartan (COZAAR) 100 MG tablet TAKE  1 TABLET (100MG) BY MOUTH DAILY. 11/08/14  Yes JJettie Booze MD  meclizine (Johnathan Hausen  12.5 MG tablet Take 12.5 mg by mouth at bedtime.  12/05/14  Yes Historical Provider, MD  megestrol (MEGACE) 40 MG tablet Take 2 tablets (80 mg total) by mouth 3 (three) times daily. 05/06/15  Yes Everitt Amber, MD  metolazone (ZAROXOLYN) 5 MG tablet TAKE 1 TABLET (5 MG TOTAL) BY MOUTH SEE ADMIN INSTRUCTIONS. 3 TIMES WEEKLY AS NEEDED FOR EDEMA 09/13/14  Yes Jettie Booze, MD  Multiple Vitamin (MULTIVITAMIN) capsule Take 1 capsule by mouth daily.     Yes Historical Provider, MD  mupirocin ointment (BACTROBAN) 2 % APPLY 1 APPLICATION TO AFFECTED AREA 3 TIMES DAILY AS NEEDED FOR INFECTION 03/09/15  Yes Historical Provider, MD  Omega-3 Fatty Acids (OMEGA-3 FISH OIL) 1200 MG CAPS Take 2 capsules (2,400 mg total) by mouth 2 (two) times daily. 09/07/14  Yes Jettie Booze, MD  omeprazole (PRILOSEC) 40 MG capsule Take 40 mg by mouth 2 (two) times daily.    Yes Historical Provider, MD  oxyCODONE-acetaminophen (PERCOCET/ROXICET) 5-325 MG tablet Take 1 tablet 30 minutes before radiation treatments or scans. 07/20/15  Yes Eppie Gibson, MD  phenazopyridine (PYRIDIUM) 95 MG tablet Take 1 tablet (95 mg total) by mouth 3 (three) times daily as needed for pain. 08/05/15  Yes Melissa D Cross, NP  potassium chloride SA (K-DUR,KLOR-CON) 20 MEQ tablet Take 20 mEq by mouth 4 (four) times daily. 1 in AM, 2 at lunch, 2 at supper and 1 at bedtime   Yes Historical Provider, MD  primidone (MYSOLINE) 50 MG tablet Take 1 tablet (50 mg total) by mouth 2 (two) times daily. 07/19/14  Yes Donika Keith Rake, DO  Probiotic Product (PROBIOTIC PO) Take 1 tablet by mouth daily.    Yes Historical Provider, MD  promethazine (PHENERGAN) 25 MG tablet Take 1 tablet (25 mg total) by mouth every 4 (four) hours as needed for nausea or vomiting. 07/13/15  Yes Campbell Riches, MD  promethazine-codeine [facility] WITH CODEINE) 6.25-10 MG/5ML syrup TAKE 1-2  TEASPOONSFUL BY MOUTH EVERY 6 HOURS 03/24/15  Yes Historical Provider, MD  warfarin (COUMADIN) 5 MG tablet Take 2.5 mg by mouth daily at 6 PM.    Yes Historical Provider, MD  insulin regular human CONCENTRATED (HUMULIN R) 500 UNIT/ML SOLN injection Inject 40-110 Units into the skin 2 (two) times daily before a meal. Patient injects 110 units am before breakfast and pm 40 units before dinner.    Historical Provider, MD  VICTOZA 18 MG/3ML SOPN Reported on 08/05/2015 02/05/15   Historical Provider, MD   Physical Exam: Filed Vitals:   08/15/15 1445 08/15/15 1515 08/15/15 1530 08/15/15 1545  BP: 127/63 130/69 121/61 134/82  Pulse: 73 74 72 71  Temp:      TempSrc:      Resp: 19 25 24 18   SpO2: 99% 97% 97% 96%     General:  Awake, in nad  Eyes: PERRL B  ENT: membranes moist, dentition fair  Neck: trachea midline, neck supple   Cardiovascular: Regular, s1, s2  Respiratory: normal resp effort, no wheezing  Abdomen: soft, nondistended  Skin: normal skin turgor, no abnormal skin lesions seen  Musculoskeletal: perfused, no clubbing  Psychiatric: mood/affect normal// no auditory/visual hallucinations  Neurologic: cn2-12 grossly intact, strength/sensation intact  Labs on Admission:  Basic Metabolic Panel:  Recent Labs Lab 08/15/15 1238  NA 125*  K 3.4*  CL 84*  CO2 25  GLUCOSE 790*  BUN 14  CREATININE 1.31*  CALCIUM 9.4   Liver Function Tests:  Recent Labs Lab  08/15/15 1238  AST 29  ALT 25  ALKPHOS 75  BILITOT 0.9  PROT 7.8  ALBUMIN 3.4*   No results for input(s): LIPASE, AMYLASE in the last 168 hours. No results for input(s): AMMONIA in the last 168 hours. CBC:  Recent Labs Lab 08/15/15 1238  WBC 5.4  NEUTROABS 3.6  HGB 13.8  HCT 38.9  MCV 87.8  PLT 147*   Cardiac Enzymes: No results for input(s): CKTOTAL, CKMB, CKMBINDEX, TROPONINI in the last 168 hours.  BNP (last 3 results)  Recent Labs  09/20/14 1155 11/08/14 1016 08/15/15 1238  BNP 12.5  53.8 46.8    ProBNP (last 3 results) No results for input(s): PROBNP in the last 8760 hours.  CBG:  Recent Labs Lab 08/15/15 1627  GLUCAP >600*    Radiological Exams on Admission: Dg Chest 2 View  08/15/2015  CLINICAL DATA:  SOB FOR 3 DAYS,HX RTACH EXAM: CHEST - 2 VIEW COMPARISON:  None available FINDINGS: Lungs are clear. Stable cardiomegaly. Stable tracheostomy device. No effusion. No pneumothorax. Visualized skeletal structures are unremarkable. IMPRESSION: Cardiomegaly.  No acute cardiopulmonary disease. Electronically Signed   By: Lucrezia Europe [name]   On: 08/15/2015 13:27    Assessment/Plan Principal Problem:   DKA (diabetic ketoacidoses) ([place]) Active Problems:   Morbid obesity ([place])   Diabetes mellitus type 2 in obese ([facility])   HTN (hypertension), benign   Tracheostomy dependence ([place])   Current use of long term anticoagulation   Chronic diastolic heart failure ([place])   Mixed hyperlipidemia   Hyperglycemia  1. Early DKA 1. Patient insulin dependent prior to admission 2. GAP mildly elevated at 16 3. Will initiate insulin gtt, DKA protocol 4. Cont with aggressive ivf as tolerated 5. Consult diabetic coordinator 6. Admit to stepdown 2. Morbid obesity 1. Stable 2. Cessation done 3. DM2, insulin dependent 1. Continue on insulin gtt 4. HTN 1. BP stable 2. Cont home meds 5. Hx dvt on chronic anticoagulation 1. Continue coumadin, pharmacy to dose 6. Chronic diastolic CHF 1. Presenting BNP 46.8 2. Enlarged heart on cxr 3. Cont IVF as tolerated per above 7. HLD 1. Continue statin per home regimen  Code Status: Full Family Communication: Pt in room Disposition Plan: admit to stepdown   Delayna Sparlin, [facility] Pager [phone]  If 7PM-7AM, please contact night-coverage [email or web address] Password [facility] 08/15/2015, 4:35 PM

## 2015-08-15 NOTE — ED Notes (Signed)
Placed pt on bed pan.

## 2015-08-15 NOTE — ED Provider Notes (Signed)
CSN: [id number]     Arrival date & time 08/15/15  32 History   First MD Initiated Contact with Patient 08/15/15 1103     Chief Complaint  Patient presents with  . Shortness of Breath     (Consider location/radiation/quality/duration/timing/severity/associated sxs/prior Treatment) Patient is a 55 y.o. female presenting with shortness of breath.  Shortness of Breath Severity:  Moderate Onset quality:  Gradual Duration:  2 days Timing:  Constant Progression:  Worsening Chronicity:  Recurrent Context: not URI   Relieved by:  Rest and inhaler (mild improvement) Worsened by:  Coughing and exertion Associated symptoms: cough and wheezing   Associated symptoms: no abdominal pain, no chest pain, no ear pain, no fever, no headaches, no rash, no sore throat and no vomiting   Risk factors comment:  H/o CHF and asthma   Past Medical History  Diagnosis Date  . Morbid obesity ([place])   . Depressive disorder, not elsewhere classified   . Asthma   . Dyslipidemia   . Clostridium difficile enterocolitis 2012  . Splenomegaly 06/01/2011  . Thrombocytopenia ([place]) 06/01/2011  . Hepatosplenomegaly   . Gout   . GERD (gastroesophageal reflux disease)   . Osteoarthritis   . Polyneuropathy ([place])   . Urinary incontinence   . Amenorrhea   . CHF (congestive heart failure) ([place])   . Hyperlipidemia   . Leg swelling   . Nausea   . Weakness   . Palpitations   . Anginal pain ([place])     no chest pain in years  . Dysrhythmia     heart palpations  . Anxiety   . Pneumonia   . Shortness of breath   . Chronic kidney disease     overactive bladder  . Peripheral neuropathy ([place])   . DVT (deep venous thrombosis) ([place])   . Endometrial cancer ([place])   . OSA (obstructive sleep apnea)     uses ventilator ( has trach)  . Diabetes mellitus     type 2  . Irritable bowel syndrome   . PONV (postoperative nausea and vomiting)     just nausea   Past Surgical History  Procedure Laterality Date  .  Cholecystectomy    . Tracheostomy  2001  . Endometrial ablation    . Skin graft    . Finger surgery      ring finger on right hand  . Esophageal dilation  2003  . Breast biopsy      needle core, left  . Dilation and curettage of uterus      times 2  . Tracheal dilitation  01/24/2012    Procedure: TRACHEAL DILITATION;  Surgeon: Melissa Montane, MD;  Location: [facility];  Service: ENT;  Laterality: N/A;  Trache change with possible dilation, from size 6 to size 7 uncuffed  . Amputation Right 11/19/2013    Procedure: AMPUTATION DIGIT;  Surgeon: Cammie Sickle, MD;  Location: [facility];  Service: Orthopedics;  Laterality: Right;  Partial amputation right long finger, Debridement right ring finger   . Amputation Right 06/03/2014    Procedure: REVISION AMPUTATION RIGHT RING FINGER;  Surgeon: Daryll Brod, MD;  Location: [facility];  Service: Orthopedics;  Laterality: Right;  . Dilatation & currettage/hysteroscopy with resectocope N/A 01/17/2015    Procedure: DILATATION & CURETTAGE/HYSTEROSCOPY WITH RESECTOCOPE; Pap Smear;  Surgeon: Ena Dawley, MD;  Location: [facility];  Service: Gynecology;  Laterality: N/A;  . Intrauterine device (iud) insertion N/A 01/17/2015    Procedure: INTRAUTERINE DEVICE (IUD)  INSERTION;  Surgeon: Ena Dawley, MD;  Location: [facility];  Service: Gynecology;  Laterality: N/A;  . Finger surgery Bilateral 06/16/15    I & D of multiple fingers  . Incision and drainage Left 06/16/2015    Procedure: IRRIGATION AND DEBRIDEMENT OF LEFT RING FINGER AND LEFT SMALL FINGER.;  Surgeon: Daryll Brod, MD;  Location: [facility];  Service: Orthopedics;  Laterality: Left;  . Dressing change under anesthesia Right 06/16/2015    Procedure: DRESSING CHANGE UNDER ANESTHESIA TO RIGHT THUMB AND RIGHT FIRST FINGER.;  Surgeon: Daryll Brod, MD;  Location: [facility];  Service: Orthopedics;  Laterality: Right;   Family History  Problem Relation Age of Onset  . Diabetes Father   .  Hypertension Father   . Dementia Father     Deceased, 51  . Pneumonia Father   . Heart disease Mother   . Clotting disorder Mother   . Hypertension Mother   . Heart attack Mother     Deceased, 66  . Multiple myeloma Paternal Grandmother   . Cancer Paternal Grandmother     multiple myoloma  . Heart disease Paternal Grandfather   . Kidney disease Maternal Grandmother   . Hypertension Sister   . Cancer Paternal Aunt     breast  . Cancer Cousin     breast - all three   Social History  Substance Use Topics  . Smoking status: Never Smoker   . Smokeless tobacco: Never Used  . Alcohol Use: No     Comment: occasional - once or twice a year   OB History    Gravida Para Term Preterm AB TAB SAB Ectopic Multiple Living   0         0     Review of Systems  Constitutional: Negative for fever, chills, appetite change and fatigue.  HENT: Negative for congestion, ear pain, facial swelling, mouth sores and sore throat.   Eyes: Negative for visual disturbance.  Respiratory: Positive for cough, shortness of breath and wheezing. Negative for chest tightness.   Cardiovascular: Positive for leg swelling. Negative for chest pain and palpitations.  Gastrointestinal: Negative for nausea, vomiting, abdominal pain, diarrhea and blood in stool.  Endocrine: Negative for cold intolerance and heat intolerance.  Genitourinary: Negative for frequency, decreased urine volume and difficulty urinating.  Musculoskeletal: Negative for back pain and neck stiffness.  Skin: Negative for rash.  Neurological: Negative for dizziness, weakness, light-headedness and headaches.  All other systems reviewed and are negative.     Allergies  Codeine; Molds & smuts; Allopurinol; Ciprofloxacin; Doxycycline; Fexofenadine; Keflex; Nitroglycerin er; Oritavancin; Sulfa drugs cross reactors; Accolate; Amoxicillin-pot clavulanate; Morphine; Nystatin; and Septra  Home Medications   Prior to Admission medications    Medication Sig Start Date End Date Taking? Authorizing Provider  acetaminophen (TYLENOL) 500 MG tablet Take 1,000 mg by mouth every 6 (six) hours as needed for mild pain. For pain   Yes Historical Provider, MD  albuterol (PROVENTIL) (2.5 MG/3ML) 0.083% nebulizer solution Take 2.5 mg by nebulization every 4 (four) hours as needed. For shortness of breath 05/28/11  Yes Chesley Mires, MD  ALPRAZolam Duanne Moron) 0.5 MG tablet Take 0.5 mg by mouth 2 (two) times daily as needed. For anxiety   Yes Historical Provider, MD  atorvastatin (LIPITOR) 10 MG tablet Take 10 mg by mouth at bedtime.    Yes Historical Provider, MD  B Complex Vitamins (VITAMIN B COMPLEX PO) Take 1 tablet by mouth daily.    Yes Historical Provider, MD  budesonide (  PULMICORT) 0.25 MG/2ML nebulizer solution Take 0.25 mg by nebulization 2 (two) times daily as needed (shortness of breath).   Yes Historical Provider, MD  bumetanide (BUMEX) 2 MG tablet Take 1 tablet (2 mg total) by mouth daily. May take one extra 47m tab daily as needed for edema. 10/07/14  Yes JJettie Booze MD  busPIRone (BUSPAR) 5 MG tablet Take 7.5 mg by mouth 2 (two) times daily.    Yes Historical Provider, MD  BYSTOLIC 10 MG tablet TAKE 1 TABLET (10 MG TOTAL) BY MOUTH DAILY. 09/13/14  Yes JJettie Booze MD  cholestyramine (Lucrezia Starch 4 G packet Take 1 packet by mouth as needed. For loose stools, IBS.   Yes Historical Provider, MD  clotrimazole (LOTRIMIN) 1 % cream Apply 1 application topically daily. To stomach   Yes Historical Provider, MD  colchicine 0.6 MG tablet Take 0.6 mg by mouth daily.   Yes Historical Provider, MD  diphenoxylate-atropine (LOMOTIL) 2.5-0.025 MG tablet Take 1-2 tablets by mouth 4 (four) times daily as needed for diarrhea or loose stools. 07/18/15  Yes SEppie Gibson MD  DULoxetine (CYMBALTA) 30 MG capsule TAKE 1 CAPSULE (30 MG TOTAL) BY MOUTH 2 (TWO) TIMES DAILY. 05/11/15  Yes Donika KKeith Rake DO  ergocalciferol (VITAMIN D2) 50000 UNITS capsule  Take 50,000 Units by mouth once a week. Take on Fridays   Yes Historical Provider, MD  Febuxostat (ULORIC) 80 MG TABS Take 80 mg by mouth at bedtime.    Yes Historical Provider, MD  fesoterodine (TOVIAZ) 4 MG TB24 Take 4 mg by mouth daily.     Yes Historical Provider, MD  fluticasone (FLONASE) 50 MCG/ACT nasal spray Place 1 spray into both nostrils as needed for allergies or rhinitis. Reported on 08/05/2015 10/04/14  Yes Historical Provider, MD  gabapentin (NEURONTIN) 100 MG capsule TAKE ONE CAPSULE BY MOUTH EVERY MORNING AND 2 CAPS IN THE AFTERNOON AND 2 CAPS AT BEDTIME Patient taking differently: Take 100 mg by mouth 4 (four) times daily. TAKE ONE CAPSULE BY MOUTH EVERY MORNING AND 1 CAPS [facility] LUNCH TIME, [facility] 05/13/14  Yes Donika K Patel, DO  guaiFENesin (MUCINEX) 600 MG 12 hr tablet Take 600 mg by mouth 2 (two) times daily.    Yes Historical Provider, MD  HUMULIN R U-500 KWIKPEN 500 UNIT/ML injection INJECT (110 UNITS) BEFORE BREAKFAST AND (50 UNITS) BEFORE EVENING MEAL SUBCUTANEOUSLY TWICE A DAY 03/02/15  Yes Historical Provider, MD  HYDROcodone-acetaminophen (NORCO) 5-325 MG tablet Take 1 tablet by mouth every 6 (six) hours as needed for moderate pain. 06/16/15  Yes GDaryll Brod MD  INVOKANA 100 MG TABS tablet Take 100 mg by mouth daily. 03/02/15  Yes Historical Provider, MD  ipratropium (ATROVENT) 0.02 % nebulizer solution Take 0.5 mg by nebulization 4 (four) times daily as needed for wheezing or shortness of breath.   Yes Historical Provider, MD  ketoconazole (NIZORAL) 2 % cream Apply 1 application topically daily. 04/13/15  Yes VDonnel Saxon CNM  LORazepam (ATIVAN) 0.5 MG tablet Take 1 tablet by mouth 30 minutes before radiotherapy, PRN nausea 07/11/15  Yes SEppie Gibson MD  losartan (COZAAR) 100 MG tablet TAKE 1 TABLET (100MG) BY MOUTH DAILY. 11/08/14  Yes JJettie Booze MD  meclizine (ANTIVERT) 12.5 MG tablet Take 12.5 mg by mouth at bedtime.  12/05/14  Yes Historical  Provider, MD  megestrol (MEGACE) 40 MG tablet Take 2 tablets (80 mg total) by mouth 3 (three) times daily. 05/06/15  Yes ETerrence Dupont  Denman George, MD  metolazone (ZAROXOLYN) 5 MG tablet TAKE 1 TABLET (5 MG TOTAL) BY MOUTH SEE ADMIN INSTRUCTIONS. 3 TIMES WEEKLY AS NEEDED FOR EDEMA 09/13/14  Yes Jettie Booze, MD  Multiple Vitamin (MULTIVITAMIN) capsule Take 1 capsule by mouth daily.     Yes Historical Provider, MD  mupirocin ointment (BACTROBAN) 2 % APPLY 1 APPLICATION TO AFFECTED AREA 3 TIMES DAILY AS NEEDED FOR INFECTION 03/09/15  Yes Historical Provider, MD  Omega-3 Fatty Acids (OMEGA-3 FISH OIL) 1200 MG CAPS Take 2 capsules (2,400 mg total) by mouth 2 (two) times daily. 09/07/14  Yes Jettie Booze, MD  omeprazole (PRILOSEC) 40 MG capsule Take 40 mg by mouth 2 (two) times daily.    Yes Historical Provider, MD  oxyCODONE-acetaminophen (PERCOCET/ROXICET) 5-325 MG tablet Take 1 tablet 30 minutes before radiation treatments or scans. 07/20/15  Yes Eppie Gibson, MD  phenazopyridine (PYRIDIUM) 95 MG tablet Take 1 tablet (95 mg total) by mouth 3 (three) times daily as needed for pain. 08/05/15  Yes Melissa D Cross, NP  potassium chloride SA (K-DUR,KLOR-CON) 20 MEQ tablet Take 20 mEq by mouth 4 (four) times daily. 1 in AM, 2 at lunch, 2 at supper and 1 at bedtime   Yes Historical Provider, MD  primidone (MYSOLINE) 50 MG tablet Take 1 tablet (50 mg total) by mouth 2 (two) times daily. 07/19/14  Yes Donika Keith Rake, DO  Probiotic Product (PROBIOTIC PO) Take 1 tablet by mouth daily.    Yes Historical Provider, MD  promethazine (PHENERGAN) 25 MG tablet Take 1 tablet (25 mg total) by mouth every 4 (four) hours as needed for nausea or vomiting. 07/13/15  Yes Campbell Riches, MD  promethazine-codeine [facility] WITH CODEINE) 6.25-10 MG/5ML syrup TAKE 1-2 TEASPOONSFUL BY MOUTH EVERY 6 HOURS 03/24/15  Yes Historical Provider, MD  warfarin (COUMADIN) 5 MG tablet Take 2.5 mg by mouth daily at 6 PM.    Yes Historical Provider,  MD  insulin regular human CONCENTRATED (HUMULIN R) 500 UNIT/ML SOLN injection Inject 40-110 Units into the skin 2 (two) times daily before a meal. Patient injects 110 units am before breakfast and pm 40 units before dinner.    Historical Provider, MD  VICTOZA 18 MG/3ML SOPN Reported on 08/05/2015 02/05/15   Historical Provider, MD   BP 107/84 mmHg  Pulse 73  Temp(Src) 98.2 F (36.8 C) (Oral)  Resp 24  SpO2 100% Physical Exam  Constitutional: She is oriented to person, place, and time. She appears well-developed and well-nourished. No distress.  HENT:  Head: Normocephalic and atraumatic.  Right Ear: External ear normal.  Left Ear: External ear normal.  Nose: Nose normal.  Eyes: Conjunctivae and EOM are normal. Pupils are equal, round, and reactive to light. Right eye exhibits no discharge. Left eye exhibits no discharge. No scleral icterus.  Neck: Normal range of motion. Neck supple.  Cardiovascular: Normal rate, regular rhythm and normal heart sounds.  Exam reveals no gallop and no friction rub.   No murmur heard. Pulmonary/Chest: Effort normal and breath sounds normal. No stridor. No respiratory distress. She has no wheezes.  Abdominal: Soft. She exhibits no distension. There is no tenderness.  Musculoskeletal: She exhibits no edema or tenderness.  Neurological: She is alert and oriented to person, place, and time.  Skin: Skin is warm and dry. No rash noted. She is not diaphoretic. No erythema.  Psychiatric: She has a normal mood and affect.    ED Course  Procedures (including critical care time) Labs Review Labs  Reviewed  CBC WITH DIFFERENTIAL/PLATELET - Abnormal; Notable for the following:    Platelets 147 (*)    All other components within normal limits  COMPREHENSIVE METABOLIC PANEL - Abnormal; Notable for the following:    Sodium 125 (*)    Potassium 3.4 (*)    Chloride 84 (*)    Glucose, Bld 790 (*)    Creatinine, Ser 1.31 (*)    Albumin 3.4 (*)    GFR calc non Af Amer  46 (*)    GFR calc Af Amer 53 (*)    Anion gap 16 (*)    All other components within normal limits  URINALYSIS, ROUTINE W REFLEX MICROSCOPIC (NOT AT [facility]) - Abnormal; Notable for the following:    Specific Gravity, Urine 1.033 (*)    Glucose, UA >1000 (*)    All other components within normal limits  PROTIME-INR - Abnormal; Notable for the following:    Prothrombin Time 36.5 (*)    INR 3.80 (*)    All other components within normal limits  URINE MICROSCOPIC-ADD ON - Abnormal; Notable for the following:    Squamous Epithelial / LPF 0-5 (*)    Bacteria, UA FEW (*)    All other components within normal limits  I-STAT VENOUS BLOOD GAS, ED - Abnormal; Notable for the following:    pH, Ven 7.380 (*)    pCO2, Ven 53.2 (*)    Bicarbonate 31.4 (*)    Acid-Base Excess 5.0 (*)    All other components within normal limits  BRAIN NATRIURETIC PEPTIDE  I-STAT TROPOININ, ED    Imaging Review Dg Chest 2 View  08/15/2015  CLINICAL DATA:  SOB FOR 3 DAYS,HX RTACH EXAM: CHEST - 2 VIEW COMPARISON:  None available FINDINGS: Lungs are clear. Stable cardiomegaly. Stable tracheostomy device. No effusion. No pneumothorax. Visualized skeletal structures are unremarkable. IMPRESSION: Cardiomegaly.  No acute cardiopulmonary disease. Electronically Signed   By: Lucrezia Europe [name]   On: 08/15/2015 13:27   I have personally reviewed and evaluated these images and lab results as part of my medical decision-making.   EKG Interpretation   Date/Time:  Monday August 15 2015 10:55:13 EST Ventricular Rate:  74 PR Interval:  156 QRS Duration: 105 QT Interval:  455 QTC Calculation: 505 R Axis:   17 Text Interpretation:  Sinus rhythm Low voltage, precordial leads  Borderline T abnormalities, anterior leads Borderline prolonged QT  interval No significant change since last tracing Confirmed by LITTLE MD,  RACHEL [phone]) on 08/15/2015 12:48:39 PM      MDM   55 year old female with a history of morbid obesity,  asthma, OSA requiring trach, listed CHF with a recent echo with normal systolic and diastolic function who presents the ED with 2 days of worsening shortness of breath. At baseline patient is extremely sedentary and only ambulates with a walker and assistance. Patient also has a history of DVTs currently on Coumadin. Rest of the history as above.  On arrival patient is afebrile with stable vital signs, satting 98% on room air through her trach. She is in no acute distress and nontoxic. She does have bilateral pitting edema to the lower extremities.  Chest x-ray without evidence of pulmonary edema, pneumonia, pneumothorax, pleural effusions. She is therapeutic on her Coumadin. BNP within normal limits. Labs revealed acute renal injury with severe hyperglycemia and no evidence of DKA. Patient was given IV fluids and IV insulin bolus.   She will require admission for continued management of her hyperglycemia.  Presentation consistent with CHF exacerbation at this time however she will require close monitoring given her history and required IV fluids for her hyperglycemia. There is low concern for pulmonary embolism at this time given her indolent symptoms as well as the fact that she is supratherapeutic on her Coumadin.  Patient was seen in conjunction with Dr. Rex Kras.   Diagnosis studies were interpreted by me and use to my clinical decision-making.  Final diagnoses:  Hyperglycemia  Morbid obesity, unspecified obesity type [facility])        Addison Lank, MD 08/15/15 [place], MD 08/24/15 [phone]

## 2015-08-16 DIAGNOSIS — E782 Mixed hyperlipidemia: Secondary | ICD-10-CM

## 2015-08-16 LAB — GLUCOSE, CAPILLARY
GLUCOSE-CAPILLARY: 312 mg/dL — AB (ref 65–99)
GLUCOSE-CAPILLARY: 363 mg/dL — AB (ref 65–99)
GLUCOSE-CAPILLARY: 371 mg/dL — AB (ref 65–99)
GLUCOSE-CAPILLARY: 356 mg/dL — AB (ref 65–99)
GLUCOSE-CAPILLARY: 373 mg/dL — AB (ref 65–99)
Glucose-Capillary: 420 mg/dL — ABNORMAL HIGH (ref 65–99)
Glucose-Capillary: 409 mg/dL — ABNORMAL HIGH (ref 65–99)
Glucose-Capillary: 329 mg/dL — ABNORMAL HIGH (ref 65–99)
Glucose-Capillary: 313 mg/dL — ABNORMAL HIGH (ref 65–99)
Glucose-Capillary: 318 mg/dL — ABNORMAL HIGH (ref 65–99)
Glucose-Capillary: 271 mg/dL — ABNORMAL HIGH (ref 65–99)
Glucose-Capillary: 265 mg/dL — ABNORMAL HIGH (ref 65–99)
Glucose-Capillary: 243 mg/dL — ABNORMAL HIGH (ref 65–99)

## 2015-08-16 LAB — BASIC METABOLIC PANEL
ANION GAP: 15 (ref 5–15)
Anion gap: 11 (ref 5–15)
BUN: 12 mg/dL (ref 6–20)
BUN: 14 mg/dL (ref 6–20)
CHLORIDE: 93 mmol/L — AB (ref 101–111)
CHLORIDE: 96 mmol/L — AB (ref 101–111)
CO2: 24 mmol/L (ref 22–32)
CO2: 25 mmol/L (ref 22–32)
CREATININE: 1.04 mg/dL — AB (ref 0.44–1.00)
Calcium: 9 mg/dL (ref 8.9–10.3)
Calcium: 9 mg/dL (ref 8.9–10.3)
Creatinine, Ser: 1.14 mg/dL — ABNORMAL HIGH (ref 0.44–1.00)
GFR calc Af Amer: >60 mL/min (ref >60)
GFR calc non Af Amer: >60 mL/min (ref >60)
GFR calc non Af Amer: 54 mL/min — ABNORMAL LOW (ref >60)
GLUCOSE: 437 mg/dL — AB (ref 65–99)
GLUCOSE: 285 mg/dL — AB (ref 65–99)
POTASSIUM: 3.3 mmol/L — AB (ref 3.5–5.1)
Potassium: 2.6 mmol/L — CL (ref 3.5–5.1)
Sodium: 132 mmol/L — ABNORMAL LOW (ref 135–145)
Sodium: 132 mmol/L — ABNORMAL LOW (ref 135–145)

## 2015-08-16 LAB — COMPREHENSIVE METABOLIC PANEL
ALT: 20 U/L (ref 14–54)
AST: 20 U/L (ref 15–41)
Albumin: 2.9 g/dL — ABNORMAL LOW (ref 3.5–5.0)
Alkaline Phosphatase: 60 U/L (ref 38–126)
Anion gap: 11 (ref 5–15)
BUN: 13 mg/dL (ref 6–20)
CO2: 29 mmol/L (ref 22–32)
Calcium: 8.8 mg/dL — ABNORMAL LOW (ref 8.9–10.3)
Chloride: 93 mmol/L — ABNORMAL LOW (ref 101–111)
Creatinine, Ser: 1.11 mg/dL — ABNORMAL HIGH (ref 0.44–1.00)
GFR calc Af Amer: >60 mL/min (ref >60)
GFR calc non Af Amer: 56 mL/min — ABNORMAL LOW (ref >60)
Glucose, Bld: 360 mg/dL — ABNORMAL HIGH (ref 65–99)
Potassium: 3.2 mmol/L — ABNORMAL LOW (ref 3.5–5.1)
Sodium: 133 mmol/L — ABNORMAL LOW (ref 135–145)
Total Bilirubin: 0.8 mg/dL (ref 0.3–1.2)
Total Protein: 6.6 g/dL (ref 6.5–8.1)

## 2015-08-16 LAB — PROTIME-INR
INR: 3.47 — ABNORMAL HIGH (ref 0.00–1.49)
Prothrombin Time: 34.2 seconds — ABNORMAL HIGH (ref 11.6–15.2)

## 2015-08-16 LAB — CBC
HCT: 35.8 % — ABNORMAL LOW (ref 36.0–46.0)
Hemoglobin: 12.3 g/dL (ref 12.0–15.0)
MCH: 30.1 pg (ref 26.0–34.0)
MCHC: 34.4 g/dL (ref 30.0–36.0)
MCV: 87.7 fL (ref 78.0–100.0)
Platelets: 136 10*3/uL — ABNORMAL LOW (ref 150–400)
RBC: 4.08 MIL/uL (ref 3.87–5.11)
RDW: 14.6 % (ref 11.5–15.5)
WBC: 6.5 10*3/uL (ref 4.0–10.5)

## 2015-08-16 LAB — CBG MONITORING, ED: GLUCOSE-CAPILLARY: 251 mg/dL — AB (ref 65–99)

## 2015-08-16 LAB — MRSA PCR SCREENING: MRSA by PCR: POSITIVE — AB

## 2015-08-16 MED ORDER — INSULIN REGULAR HUMAN (CONC) 500 UNIT/ML ~~LOC~~ SOPN
110.0000 [IU] | PEN_INJECTOR | Freq: Every day | SUBCUTANEOUS | Status: DC
  Administered 2015-08-16: 110 [IU] via SUBCUTANEOUS
  Filled 2015-08-16: qty 3

## 2015-08-16 MED ORDER — INSULIN GLARGINE 100 UNIT/ML ~~LOC~~ SOLN
20.0000 [IU] | Freq: Every day | SUBCUTANEOUS | Status: DC
  Administered 2015-08-16: 20 [IU] via SUBCUTANEOUS
  Filled 2015-08-16 (×2): qty 0.2

## 2015-08-16 MED ORDER — INSULIN ASPART 100 UNIT/ML ~~LOC~~ SOLN
0.0000 [IU] | Freq: Three times a day (TID) | SUBCUTANEOUS | Status: DC
  Administered 2015-08-16: 20 [IU] via SUBCUTANEOUS

## 2015-08-16 MED ORDER — POTASSIUM CHLORIDE 10 MEQ/100ML IV SOLN
10.0000 meq | INTRAVENOUS | Status: AC
  Administered 2015-08-16 (×4): 10 meq via INTRAVENOUS
  Filled 2015-08-16 (×2): qty 100

## 2015-08-16 MED ORDER — SODIUM CHLORIDE 0.9 % IV SOLN: INTRAVENOUS | Status: AC

## 2015-08-16 MED ORDER — DEXTROSE-NACL 5-0.45 % IV SOLN
INTRAVENOUS | Status: DC
  Administered 2015-08-17 (×2): via INTRAVENOUS

## 2015-08-16 MED ORDER — INSULIN ASPART 100 UNIT/ML ~~LOC~~ SOLN
0.0000 [IU] | SUBCUTANEOUS | Status: DC
  Administered 2015-08-16: 20 [IU] via SUBCUTANEOUS

## 2015-08-16 MED ORDER — INSULIN REGULAR HUMAN (CONC) 500 UNIT/ML ~~LOC~~ SOPN
50.0000 [IU] | PEN_INJECTOR | Freq: Two times a day (BID) | SUBCUTANEOUS | Status: DC
  Administered 2015-08-16: 50 [IU] via SUBCUTANEOUS
  Filled 2015-08-16: qty 3

## 2015-08-16 MED ORDER — SODIUM CHLORIDE 0.9 % IV SOLN
INTRAVENOUS | Status: DC
  Administered 2015-08-16: 16:00:00 via INTRAVENOUS
  Administered 2015-08-17: 16.2 [IU]/h via INTRAVENOUS
  Administered 2015-08-17: 19.6 [IU]/h via INTRAVENOUS
  Administered 2015-08-17: 18.5 [IU]/h via INTRAVENOUS
  Filled 2015-08-16: qty 2.5

## 2015-08-16 MED ORDER — POTASSIUM CHLORIDE CRYS ER 20 MEQ PO TBCR
40.0000 meq | EXTENDED_RELEASE_TABLET | Freq: Two times a day (BID) | ORAL | Status: DC
  Administered 2015-08-16: 40 meq via ORAL
  Filled 2015-08-16: qty 2

## 2015-08-16 MED ORDER — LIDOCAINE VISCOUS 2 % MT SOLN
15.0000 mL | Freq: Once | OROMUCOSAL | Status: AC
  Administered 2015-08-16: 15 mL via OROMUCOSAL
  Filled 2015-08-16: qty 15

## 2015-08-16 MED ORDER — INSULIN ASPART 100 UNIT/ML ~~LOC~~ SOLN: 0.0000 [IU] | Freq: Every day | SUBCUTANEOUS | Status: DC

## 2015-08-16 MED ORDER — SODIUM CHLORIDE 0.9 % IV SOLN
INTRAVENOUS | Status: DC
  Administered 2015-08-16: 16:00:00 via INTRAVENOUS

## 2015-08-16 NOTE — Progress Notes (Signed)
Utilization Review Completed.  

## 2015-08-16 NOTE — Progress Notes (Signed)
ANTICOAGULATION CONSULT NOTE - Follow Up Consult  Pharmacy Consult for Warfarin Indication: Hx DVT  Allergies  Allergen Reactions  . Codeine Other (See Comments)    Heart problems  . Molds & Smuts Shortness Of Breath and Rash  . Allopurinol Other (See Comments)    GI problems   . Ciprofloxacin Other (See Comments)    GI problems  . Doxycycline Other (See Comments)    Gastric problems  . Fexofenadine Other (See Comments)    Hurts joints/pain  . Keflex [Cephalexin] Diarrhea and Nausea Only  . Nitroglycerin Er Nausea And Vomiting  . Oritavancin Itching    Mild-moderate itching.   . Sulfa Drugs Cross Reactors Nausea And Vomiting  . Accolate [Zafirlukast] Other (See Comments)     rash  . Amoxicillin-Pot Clavulanate Other (See Comments)     GI problems  . Morphine Other (See Comments)    Nausea and irregular HR(flutter)  . Nystatin Rash  . Septra [Sulfamethoxazole-Trimethoprim] Other (See Comments)    unknown    Patient Measurements:    Vital Signs: Temp: 98.2 F (36.8 C) (02/21 0748) Temp Source: Oral (02/21 0748) BP: 117/55 mmHg (02/21 0748) Pulse Rate: 74 (02/21 0748)  Labs:  Recent Labs  08/15/15 1238  08/15/15 1940 08/16/15 0034 08/16/15 0409  HGB 13.8  --   --   --  12.3  HCT 38.9  --   --   --  35.8*  PLT 147*  --   --   --  136*  LABPROT 36.5*  --   --   --  34.2*  INR 3.80*  --   --   --  3.47*  CREATININE 1.31*  < > 0.97 1.04* 1.11*  < > = values in this interval not displayed.  Estimated Creatinine Clearance: 101.7 mL/min (by C-G formula based on Cr of 1.11).   Medical History: Past Medical History  Diagnosis Date  . Morbid obesity ([place])   . Depressive disorder, not elsewhere classified   . Asthma   . Dyslipidemia   . Clostridium difficile enterocolitis 2012  . Splenomegaly 06/01/2011  . Thrombocytopenia ([place]) 06/01/2011  . Hepatosplenomegaly   . Gout   . GERD (gastroesophageal reflux disease)   . Osteoarthritis   . Polyneuropathy ([place])    . Urinary incontinence   . Amenorrhea   . CHF (congestive heart failure) ([place])   . Hyperlipidemia   . Leg swelling   . Nausea   . Weakness   . Palpitations   . Anginal pain ([place])     no chest pain in years  . Dysrhythmia     heart palpations  . Anxiety   . Pneumonia   . Shortness of breath   . Chronic kidney disease     overactive bladder  . Peripheral neuropathy ([place])   . DVT (deep venous thrombosis) ([place])   . Endometrial cancer ([place])   . OSA (obstructive sleep apnea)     uses ventilator ( has trach)  . Diabetes mellitus     type 2  . Irritable bowel syndrome   . PONV (postoperative nausea and vomiting)     just nausea    Assessment: 50 YOF who [facility] the [facility] on [facility] with SOB. The patient has a chronic trach and was also found to have DKA. Pharmacy was consulted to resume warfarin from PTA for hx DVT.  The patient's INR on admit was SUPRAtherapeutic (INR 3.8, goal of 2-3) on PTA dose of 2.5 mg daily -  last dose was on 2/19. INR remains supra-therapeutic today at 2.47. Hgb wnl, plts 136 K - no active signs/symptoms of bleeding noted.   Goal of Therapy:  INR 2-3   Plan:  1. Hold warfarin dose today 2. Daily PT/INR 3. Will continue to monitor for any signs/symptoms of bleeding and will follow up with PT/INR in the a.m.   Albertina Parr, PharmD., BCPS Clinical Pharmacist Pager [phone]

## 2015-08-16 NOTE — Progress Notes (Signed)
Attempted to call for report, nurse will return call

## 2015-08-16 NOTE — Evaluation (Signed)
Occupational Therapy Evaluation Patient Details Name: [name] MRN: [id number] DOB: 21-Jan-1961 Today's Date: 08/16/2015    History of Present Illness 55 y.o. female with a hx of chronic diastolic chf, HTN, morbid osbesity, OSA, trach dependence, DM on insulin who presented with complaints of subjective sob. O2 sats were normal. CXR clear. Work up demonstrated glucose of 790 with anion gap of 16.   Clinical Impression   PT admitted with the above stated deficits. . Pt currently with functional limitiations due to the deficits listed below (see OT problem list). Pt is from home with aide assistance but able to remain at home alone. Of note pt with 1 year of osteomyelitis to bil UE which require frequent dressing changes / affect ability to perform adls. Pt will benefit from skilled OT to increase their independence and safety with adls and balance to allow discharge [place]. Pt will require continued mobility and OOB while acutely at [facility]. Recommend OOB for all meals with RN staff daily to promote return home.      Follow Up Recommendations  Home health OT;Other (comment) (aide continued)    Equipment Recommendations  None recommended by OT    Recommendations for Other Services       Precautions / Restrictions Precautions Precautions: Fall Restrictions Weight Bearing Restrictions: No      Mobility Bed Mobility Overal bed mobility: Needs Assistance Bed Mobility: Supine to Sit     Supine to sit: +2 for physical assistance;Max assist     General bed mobility comments: pt requires total (A) for bil LE and to reposition in bed. Pt able to long sit in bed to help with trunk positioning  Transfers Overall transfer level: Needs assistance Equipment used: Rolling walker (2 wheeled) Transfers: Sit to/from Stand Sit to Stand: Min assist         General transfer comment: requires a rocking motion    Balance Overall balance assessment: Needs assistance Sitting-balance  support: Feet supported;No upper extremity supported Sitting balance-Leahy Scale: Good Sitting balance - Comments: Able to sit EOB with hands in lap and feet not touching floor   Standing balance support: Bilateral upper extremity supported;During functional activity Standing balance-Leahy Scale: Poor Standing balance comment: Reliant on RW                            ADL Overall ADL's : Needs assistance/impaired Eating/Feeding: Set up;Sitting   Grooming: Wash/dry face;Set up;Sitting               Lower Body Dressing: Maximal assistance;Sit to/from stand   Toilet Transfer: Minimal assistance;Cueing for safety;BSC;RW;Requires wide/bariatric   Toileting- Clothing Manipulation and Hygiene: Total assistance;Sit to/from stand       Functional mobility during ADLs: Moderate assistance;Rolling walker General ADL Comments: pt voiding bowel in chair on arrival and requesting 3n1. Pt voiding large loose stool in 3n1. pt verbalized stomach discomfort and this being second large void for today. RN present and assisting     Vision     Perception     Praxis      Pertinent Vitals/Pain Pain Assessment: Faces Faces Pain Scale: Hurts even more Pain Location: stomach Pain Descriptors / Indicators: Constant Pain Intervention(s): Monitored during session;Repositioned;Other (comment) (Rn present)     Hand Dominance Right   Extremity/Trunk Assessment Upper Extremity Assessment Upper Extremity Assessment: Overall WFL for tasks assessed (do note kyphotic posture)   Lower Extremity Assessment Lower Extremity Assessment: Defer to PT evaluation  Cervical / Trunk Assessment Cervical / Trunk Assessment: Kyphotic   Communication Communication Communication: Tracheostomy   Cognition Arousal/Alertness: Awake/alert Behavior During Therapy: WFL for tasks assessed/performed Overall Cognitive Status: Within Functional Limits for tasks assessed                      General Comments       Exercises       Shoulder Instructions      Home Living Family/patient expects to be discharged to:: Private residence Living Arrangements: Alone Available Help at Discharge: Available PRN/intermittently;Personal care attendant;Neighbor Type of Home: Apartment Home Access: Level entry     Home Layout: One level     Bathroom Shower/Tub: Teacher, early years/pre: Handicapped height     Home Equipment: Environmental consultant - 4 wheels;Walker - 2 wheels;Bedside commode;Shower seat;Wheelchair - manual   Additional Comments: aid comes on weekend 5 hours and during week 8 hours. otherwise neighbors (A) patient. aid (A) with bath but patient completes transfer by sitting and then lifting legs over tub      Prior Functioning/Environment Level of Independence: Independent with assistive device(s);Needs assistance  Gait / Transfers Assistance Needed: Independent with Rollator ADL's / Homemaking Assistance Needed: total (A) for peri hygiene, (A) for bathing but able to complete transfer, (A) for meal prep   Comments: Pt states that she sleeps in a recliner lift chair at home.     OT Diagnosis: Generalized weakness;Acute pain   OT Problem List: Decreased safety awareness;Decreased knowledge of use of DME or AE;Decreased knowledge of precautions;Pain;Impaired balance (sitting and/or standing);Decreased activity tolerance;Decreased strength;Obesity;Cardiopulmonary status limiting activity   OT Treatment/Interventions: Self-care/ADL training;Therapeutic exercise;DME and/or AE instruction;Therapeutic activities;Patient/family education;Balance training;Energy conservation    OT Goals(Current goals can be found in the care plan section) Acute Rehab OT Goals Patient Stated Goal: to return home OT Goal Formulation: With patient Time For Goal Achievement: 08/30/15 Potential to Achieve Goals: Good  OT Frequency: Min 2X/week   Barriers to D/C: Decreased caregiver  support  has to have aides (A) with daily routine otherwise home alone       Co-evaluation              End of Session Equipment Utilized During Treatment: Rolling walker Nurse Communication: Mobility status;Precautions  Activity Tolerance: Patient tolerated treatment well Patient left: in bed;with call bell/phone within reach;with nursing/sitter in room   Time: 1010-1032 OT Time Calculation (min): 22 min Charges:  OT General Charges $OT Visit: 1 Procedure OT Evaluation $OT Eval High Complexity: 1 Procedure G-Codes:    Parke Poisson B 08-30-2015, 11:16 AM  Jeri Modena   OTR/L [phone] Office: [phone] .

## 2015-08-16 NOTE — Evaluation (Signed)
Physical Therapy Evaluation Patient Details Name: [name] MRN: [id number] DOB: [birth year] Today's Date: 08/16/2015   History of Present Illness  55 y.o. female with a hx of chronic diastolic chf, HTN, morbid osbesity, OSA, trach dependence, DM on insulin who presented with complaints of subjective sob. O2 sats were normal. CXR clear. Work up demonstrated glucose of 790 with anion gap of 16.    Clinical Impression  Pt admitted with above diagnosis. Pt currently with functional limitations due to the deficits listed below (see PT Problem List). Pt was very motivated to participate in therapy as pt stated she did not want to lose her ability to move by herself. Pt was able to walk to the door with a RW and MinGuard for safety. PT recommends pt walk to the door with RW at least 2x per day with nursing (follow with w/c for safety) to help maintain current activity level. Continue to get pt up to bedside commode for toileting and encourage sitting up in chair. Talked with her nurse about the plan. Pt will benefit from skilled PT to increase their independence and safety with mobility to allow discharge back home with assistance.       Follow Up Recommendations Home health PT;Supervision - Intermittent    Equipment Recommendations  None recommended by PT (Pt already owns necessary equipment)    Recommendations for Other Services       Precautions / Restrictions Precautions Precautions: Fall Restrictions Weight Bearing Restrictions: No      Mobility  Bed Mobility Overal bed mobility: Needs Assistance Bed Mobility: Supine to Sit     Supine to sit: Supervision;HOB elevated     General bed mobility comments: Needs increased time. HOB was elevated to 45 degrees and pt used handrails  Transfers Overall transfer level: Needs assistance Equipment used: Rolling walker (2 wheeled) Transfers: Sit to/from Stand Sit to Stand: Min assist;From elevated surface             Ambulation/Gait Ambulation/Gait assistance: Min guard;+2 safety/equipment (Follow with w/c ) Ambulation Distance (Feet): 15 Feet (5+10 with sitting rest break) Assistive device: Rolling walker (2 wheeled) Gait Pattern/deviations: Step-to pattern;Decreased stride length;Trendelenburg;Antalgic;Trunk flexed;Wide base of support Gait velocity: Decreased Gait velocity interpretation: Below normal speed for age/gender General Gait Details: Very slow, needs VC's to stand upright  Stairs            Wheelchair Mobility    Modified Rankin (Stroke Patients Only)       Balance Overall balance assessment: Needs assistance Sitting-balance support: Feet unsupported;No upper extremity supported Sitting balance-Leahy Scale: Good Sitting balance - Comments: Able to sit EOB with hands in lap and feet not touching floor   Standing balance support: Bilateral upper extremity supported Standing balance-Leahy Scale: Poor Standing balance comment: Reliant on RW                             Pertinent Vitals/Pain Pain Assessment: No/denies pain  Pt. SaO2 averaged ~95% with 5L/28% FiO2 Trach collar.     Home Living Family/patient expects to be discharged to:: Private residence Living Arrangements: Alone (Has caregiver during the day and nurse there at night) Available Help at Discharge: Available PRN/intermittently;Personal care attendant;Neighbor  Pt states that she has a caregiver 6 hours a day, 7 days per week for help with ADL's and a night nurse at night. She states she is usually home alone for ~4 hours per day in the afternoons.  Type  of Home: Apartment Home Access: Level entry (Has to step over 1 curb)     Home Layout: One level Home Equipment: Walker - 4 wheels;Walker - 2 wheels;Bedside commode;Shower seat;Wheelchair - manual      Prior Function Level of Independence: Independent with assistive device(s);Needs assistance   Gait / Transfers Assistance Needed:  Independent with Rollator  ADL's / Homemaking Assistance Needed: Has assistance as needed  Comments: Pt states that she sleeps in a recliner lift chair at home.      Hand Dominance        Extremity/Trunk Assessment   Upper Extremity Assessment: Overall WFL for tasks assessed           Lower Extremity Assessment: Generalized weakness      Cervical / Trunk Assessment: Kyphotic  Communication   Communication: Tracheostomy;Other (comment) (Voice diminished but able to communicate her needs)  Cognition Arousal/Alertness: Awake/alert Behavior During Therapy: WFL for tasks assessed/performed Overall Cognitive Status: Within Functional Limits for tasks assessed                      General Comments General comments (skin integrity, edema, etc.): Pt participated very well with therapy. Pt is very motivated to participate in therapy because she does not want to lose her ability to move by herself. Encouraged pt to walk at least 2x a day if not more and to continue to get up to bedside commode to use the bathroom.     Exercises        Assessment/Plan    PT Assessment Patient needs continued PT services  PT Diagnosis Difficulty walking;Abnormality of gait;Generalized weakness   PT Problem List Decreased strength;Decreased activity tolerance;Decreased balance;Decreased mobility;Cardiopulmonary status limiting activity;Obesity  PT Treatment Interventions Gait training;Functional mobility training;Therapeutic activities;Therapeutic exercise;Balance training;Patient/family education   PT Goals (Current goals can be found in the Care Plan section) Acute Rehab PT Goals Patient Stated Goal: To keep her independence with mobility and to be able to return home PT Goal Formulation: With patient Time For Goal Achievement: 08/30/15 Potential to Achieve Goals: Good    Frequency Min 3X/week   Barriers to discharge        Co-evaluation               End of Session  Equipment Utilized During Treatment: Gait belt;Oxygen Activity Tolerance: Patient tolerated treatment well Patient left: in chair;with call bell/phone within reach Nurse Communication: Mobility status         Time: [id number] PT Time Calculation (min) (ACUTE ONLY): 38 min   Charges:   PT Evaluation $PT Eval Moderate Complexity: 1 Procedure PT Treatments $Gait Training: 8-22 mins $Therapeutic Activity: 8-22 mins   PT G Codes:       Colon Branch, SPT Colon Branch 08/16/2015, 10:11 AM

## 2015-08-16 NOTE — Progress Notes (Signed)
Attempted to place inner cannula without success. Pt speaking and in no distress at this time. Pt states she wears a vent at night at home but is refusing our ventilator. RT asked if pt could have someone bring her home vent in but she declined saying she will be fine without it and does not need it. RT will continue to monitor.

## 2015-08-16 NOTE — Progress Notes (Signed)
[facility] PROGRESS NOTE  [name] [id number] DOB: 04-12-1961 DOA: 08/15/2015 PCP: [name], MD  HPI/Brief narrative 55 y.o. female with a hx of chronic diastolic chf, HTN, morbid osbesity, OSA, trach dependence, DM on insulin who presented with complaints of subjective sob. O2 sats were normal. CXR clear. Work up demonstrated glucose of 790 with anion gap of 16. Patient was given 10 units of insulin subQ with fluid bolus and hospitalist consulted for admission.  Assessment/Plan: 1. Early DKA 1. Patient insulin dependent prior to admission 2. GAP mildly elevated at 16 on presentation 3. Initiate insulin gtt per DKA protocol was initiated 4. Attempt was made to transition to subq insulin, however glucose has steadily risen to the mid-400's with anion gap to 15. Will re-initiate DKA protocol with insulin gtt 5. Consulted diabetic coordinator 2. Morbid obesity 1. Stable 2. Cessation was done 3. DM2, insulin dependent 1. Continue on insulin gtt 4. HTN 1. BP remains stable 2. Cont home meds 5. Hx dvt on chronic anticoagulation 1. Continue coumadin, pharmacy to dose 6. Chronic diastolic CHF 1. Presenting BNP 46.8 2. Enlarged heart on cxr 3. Cont IVF as tolerated per above 7. HLD 1. Continue statin per home regimen 8. Trach dependence 1. RN reports difficulty suctioning trach 2. Patient has previously seen Dr. Janace Hoard in 2013 regarding trach care 3. Consult ENT for assistance  Code Status: Full Family Communication: Pt in room Disposition Plan: Anticipate d/c when off insulin gtt and glucose stable on subQ insulin   Consultants:  Diabetic coordinator  Procedures:    Antibiotics: Anti-infectives    None      HPI/Subjective: Denies abd pain or sob.  Objective: Filed Vitals:   08/16/15 0734 08/16/15 0748 08/16/15 1236 08/16/15 1312  BP:  117/55  128/92  Pulse: 74 74 83 84  Temp:  98.2 F (36.8 C)  97.9 F (36.6 C)  TempSrc:  Oral   Oral  Resp: 14 17 15 16   SpO2: 97% 97% 99% 100%    Intake/Output Summary (Last 24 hours) at 08/16/15 1513 Last data filed at 08/16/15 0800  Gross per 24 hour  Intake   1400 ml  Output      0 ml  Net   1400 ml   There were no vitals filed for this visit.  Exam:   General:  Awake, in nad  Cardiovascular: regular, s1, s2  Respiratory: normal resp effort, no wheezing  Abdomen: soft,nondistended  Musculoskeletal: perfused, no clubbing   Data Reviewed: Basic Metabolic Panel:  Recent Labs Lab 08/15/15 1717 08/15/15 1940 08/16/15 0034 08/16/15 0409 08/16/15 1314  NA 127* 129* 132* 133* 132*  K 3.7 3.5 2.6* 3.2* 3.3*  CL 87* 91* 96* 93* 93*  CO2 24 20* 25 29 24   GLUCOSE 592* 485* 285* 360* 437*  BUN 13 13 12 13 14   CREATININE 0.94 0.97 1.04* 1.11* 1.14*  CALCIUM 9.0 8.9 9.0 8.8* 9.0   Liver Function Tests:  Recent Labs Lab 08/15/15 1238 08/16/15 0409  AST 29 20  ALT 25 20  ALKPHOS 75 60  BILITOT 0.9 0.8  PROT 7.8 6.6  ALBUMIN 3.4* 2.9*   No results for input(s): LIPASE, AMYLASE in the last 168 hours. No results for input(s): AMMONIA in the last 168 hours. CBC:  Recent Labs Lab 08/15/15 1238 08/16/15 0409  WBC 5.4 6.5  NEUTROABS 3.6  --   HGB 13.8 12.3  HCT 38.9 35.8*  MCV 87.8 87.7  PLT 147* 136*   Cardiac  Enzymes: No results for input(s): CKTOTAL, CKMB, CKMBINDEX, TROPONINI in the last 168 hours. BNP (last 3 results)  Recent Labs  09/20/14 1155 11/08/14 1016 08/15/15 1238  BNP 12.5 53.8 46.8    ProBNP (last 3 results) No results for input(s): PROBNP in the last 8760 hours.  CBG:  Recent Labs Lab 08/16/15 0521 08/16/15 0640 08/16/15 0746 08/16/15 1310 08/16/15 1414  GLUCAP 363* 371* 356* 420* 409*    Recent Results (from the past 240 hour(s))  MRSA PCR Screening     Status: Abnormal   Collection Time: 08/16/15  2:54 AM  Result Value Ref Range Status   MRSA by PCR POSITIVE (A) NEGATIVE Final    Comment:        The  GeneXpert MRSA Assay (FDA approved for NASAL specimens only), is one component of a comprehensive MRSA colonization surveillance program. It is not intended to diagnose MRSA infection nor to guide or monitor treatment for MRSA infections. RESULT CALLED TO, READ BACK BY AND VERIFIED WITH: APRIL WHITLOW,RN @0618  08/16/15 MKELLY      Studies: Dg Chest 2 View  08/15/2015  CLINICAL DATA:  SOB FOR 3 DAYS,HX RTACH EXAM: CHEST - 2 VIEW COMPARISON:  None available FINDINGS: Lungs are clear. Stable cardiomegaly. Stable tracheostomy device. No effusion. No pneumothorax. Visualized skeletal structures are unremarkable. IMPRESSION: Cardiomegaly.  No acute cardiopulmonary disease. Electronically Signed   By: Lucrezia Europe [name]   On: 08/15/2015 13:27    Scheduled Meds: . atorvastatin  10 mg Oral QHS  . busPIRone  7.5 mg Oral BID  . colchicine  0.6 mg Oral Daily  . DULoxetine  30 mg Oral BID  . gabapentin  100 mg Oral QID  . lidocaine  15 mL Mouth/Throat Once  . losartan  100 mg Oral Daily  . nebivolol  10 mg Oral Daily  . potassium chloride  10 mEq Intravenous Q1H  . sodium chloride flush  3 mL Intravenous Q12H  . Warfarin - Pharmacist Dosing Inpatient   Does not apply q1800   Continuous Infusions: . sodium chloride    . sodium chloride    . dextrose 5 % and 0.45% NaCl    . insulin (NOVOLIN-R) infusion      Principal Problem:   DKA (diabetic ketoacidoses) (HCC) Active Problems:   Morbid obesity ([place])   Diabetes mellitus type 2 in obese ([place])   HTN (hypertension), benign   Tracheostomy dependence ([place])   Current use of long term anticoagulation   Chronic diastolic heart failure ([place])   Mixed hyperlipidemia   Hyperglycemia   Marylin Lathon, [facility] Pager [phone]. If 7PM-7AM, please contact night-coverage at [email or web address], password [facility] 08/16/2015, 3:13 PM  LOS: 1 day

## 2015-08-16 NOTE — Progress Notes (Signed)
Chaplain presented to the patient's room for spiritual care visitation.  A copy of Advance Directive will be given to the patient to examine  prior to completion, the Nursing Unit will contact [place] when the patient is ready to complete and have notarized for her medical records. Yaakov Guthrie [phone]

## 2015-08-16 NOTE — Progress Notes (Signed)
Upon arrival patient was on room air, no BVM in room. Patient has a 7.0 XLT [place] Proximal Trach with no inner cannula. Patient is in no distress. SPO2 100% and BBS clear; diminished. Extra trach and inner cannulas have been ordered.

## 2015-08-16 NOTE — Plan of Care (Signed)
Problem: Acute Rehab PT Goals(only PT should resolve) Goal: Pt Will Transfer Bed To Chair/Chair To Bed With RW

## 2015-08-16 NOTE — Progress Notes (Signed)
OT NOTE  Recommend RN staff help patient OOB to chair for every meal to decr risk for skin break down and to promote return to home. Pt with trach and currently 409 # with high risk for prolonged hospitalization if deconditioned.   Bariatric RW and bariatric w/c currently in room for staff to use daily.   Jeri Modena   OTR/L Pager: [phone] Office: [phone] .

## 2015-08-16 NOTE — Consult Note (Signed)
[name], [name] 55 y.o., female [id number]     Chief Complaint: trach obstruction  HPI: 55 yo wf, morbid obesity, obesity hypoventilation, obstructive sleep apnea with longstanding trach tube.  Changed once monthly at home by [place].  Recently, despite changing inner cannula daily, and use of humidified air daytime, and humidified ventilator night time, her trach is getting plugged where she cannot replace the inner cannula, and cannot suction either.  No pain. No recent URI.    She uses a Shiley XLT proximal cuffless trach tube even with her ventilator, and a [employer] valve for speech.    PMH: Past Medical History  Diagnosis Date  . Morbid obesity ([place])   . Depressive disorder, not elsewhere classified   . Asthma   . Dyslipidemia   . Clostridium difficile enterocolitis 2012  . Splenomegaly 06/01/2011  . Thrombocytopenia ([place]) 06/01/2011  . Hepatosplenomegaly   . Gout   . GERD (gastroesophageal reflux disease)   . Osteoarthritis   . Polyneuropathy ([place])   . Urinary incontinence   . Amenorrhea   . CHF (congestive heart failure) ([place])   . Hyperlipidemia   . Leg swelling   . Nausea   . Weakness   . Palpitations   . Anginal pain ([place])     no chest pain in years  . Dysrhythmia     heart palpations  . Anxiety   . Pneumonia   . Shortness of breath   . Chronic kidney disease     overactive bladder  . Peripheral neuropathy ([place])   . DVT (deep venous thrombosis) ([place])   . Endometrial cancer ([place])   . OSA (obstructive sleep apnea)     uses ventilator ( has trach)  . Diabetes mellitus     type 2  . Irritable bowel syndrome   . PONV (postoperative nausea and vomiting)     just nausea    Surg Hx: Past Surgical History  Procedure Laterality Date  . Cholecystectomy    . Tracheostomy  2001  . Endometrial ablation    . Skin graft    . Finger surgery      ring finger on right hand  . Esophageal dilation  2003  . Breast biopsy      needle core, left  .  Dilation and curettage of uterus      times 2  . Tracheal dilitation  01/24/2012    Procedure: TRACHEAL DILITATION;  Surgeon: Melissa Montane, MD;  Location: [facility];  Service: ENT;  Laterality: N/A;  Trache change with possible dilation, from size 6 to size 7 uncuffed  . Amputation Right 11/19/2013    Procedure: AMPUTATION DIGIT;  Surgeon: Cammie Sickle, MD;  Location: [facility];  Service: Orthopedics;  Laterality: Right;  Partial amputation right long finger, Debridement right ring finger   . Amputation Right 06/03/2014    Procedure: REVISION AMPUTATION RIGHT RING FINGER;  Surgeon: Daryll Brod, MD;  Location: [facility];  Service: Orthopedics;  Laterality: Right;  . Dilatation & currettage/hysteroscopy with resectocope N/A 01/17/2015    Procedure: DILATATION & CURETTAGE/HYSTEROSCOPY WITH RESECTOCOPE; Pap Smear;  Surgeon: Ena Dawley, MD;  Location: [facility];  Service: Gynecology;  Laterality: N/A;  . Intrauterine device (iud) insertion N/A 01/17/2015    Procedure: INTRAUTERINE DEVICE (IUD) INSERTION;  Surgeon: Ena Dawley, MD;  Location: [facility];  Service: Gynecology;  Laterality: N/A;  . Finger surgery Bilateral 06/16/15    I & D of multiple fingers  .  Incision and drainage Left 06/16/2015    Procedure: IRRIGATION AND DEBRIDEMENT OF LEFT RING FINGER AND LEFT SMALL FINGER.;  Surgeon: Daryll Brod, MD;  Location: [facility];  Service: Orthopedics;  Laterality: Left;  . Dressing change under anesthesia Right 06/16/2015    Procedure: DRESSING CHANGE UNDER ANESTHESIA TO RIGHT THUMB AND RIGHT FIRST FINGER.;  Surgeon: Daryll Brod, MD;  Location: [facility];  Service: Orthopedics;  Laterality: Right;    FHx:   Family History  Problem Relation Age of Onset  . Diabetes Father   . Hypertension Father   . Dementia Father     Deceased, 79  . Pneumonia Father   . Heart disease Mother   . Clotting disorder Mother   . Hypertension Mother   . Heart attack Mother     Deceased, 20  .  Multiple myeloma Paternal Grandmother   . Cancer Paternal Grandmother     multiple myoloma  . Heart disease Paternal Grandfather   . Kidney disease Maternal Grandmother   . Hypertension Sister   . Cancer Paternal Aunt     breast  . Cancer Cousin     breast - all three   SocHx:  reports that she has never smoked. She has never used smokeless tobacco. She reports that she does not drink alcohol or use illicit drugs.  ALLERGIES:  Allergies  Allergen Reactions  . Codeine Other (See Comments)    Heart problems  . Molds & Smuts Shortness Of Breath and Rash  . Allopurinol Other (See Comments)    GI problems   . Ciprofloxacin Other (See Comments)    GI problems  . Doxycycline Other (See Comments)    Gastric problems  . Fexofenadine Other (See Comments)    Hurts joints/pain  . Keflex [Cephalexin] Diarrhea and Nausea Only  . Nitroglycerin Er Nausea And Vomiting  . Oritavancin Itching    Mild-moderate itching.   . Sulfa Drugs Cross Reactors Nausea And Vomiting  . Accolate [Zafirlukast] Other (See Comments)     rash  . Amoxicillin-Pot Clavulanate Other (See Comments)     GI problems  . Morphine Other (See Comments)    Nausea and irregular HR(flutter)  . Nystatin Rash  . Septra [Sulfamethoxazole-Trimethoprim] Other (See Comments)    unknown    Medications Prior to Admission  Medication Sig Dispense Refill  . acetaminophen (TYLENOL) 500 MG tablet Take 1,000 mg by mouth every 6 (six) hours as needed for mild pain. For pain    . albuterol (PROVENTIL) (2.5 MG/3ML) 0.083% nebulizer solution Take 2.5 mg by nebulization every 4 (four) hours as needed. For shortness of breath    . ALPRAZolam (XANAX) 0.5 MG tablet Take 0.5 mg by mouth 2 (two) times daily as needed. For anxiety    . atorvastatin (LIPITOR) 10 MG tablet Take 10 mg by mouth at bedtime.     . B Complex Vitamins (VITAMIN B COMPLEX PO) Take 1 tablet by mouth daily.     . budesonide (PULMICORT) 0.25 MG/2ML nebulizer solution  Take 0.25 mg by nebulization 2 (two) times daily as needed (shortness of breath).    . bumetanide (BUMEX) 2 MG tablet Take 1 tablet (2 mg total) by mouth daily. May take one extra 69m tab daily as needed for edema. 90 tablet 3  . busPIRone (BUSPAR) 5 MG tablet Take 7.5 mg by mouth 2 (two) times daily.     .Marland KitchenBYSTOLIC 10 MG tablet TAKE 1 TABLET (10 MG TOTAL) BY MOUTH DAILY. 90 tablet 3  .  cholestyramine (QUESTRAN) 4 G packet Take 1 packet by mouth as needed. For loose stools, IBS.    . clotrimazole (LOTRIMIN) 1 % cream Apply 1 application topically daily. To stomach    . colchicine 0.6 MG tablet Take 0.6 mg by mouth daily.    . diphenoxylate-atropine (LOMOTIL) 2.5-0.025 MG tablet Take 1-2 tablets by mouth 4 (four) times daily as needed for diarrhea or loose stools. 60 tablet 1  . DULoxetine (CYMBALTA) 30 MG capsule TAKE 1 CAPSULE (30 MG TOTAL) BY MOUTH 2 (TWO) TIMES DAILY. 180 capsule 3  . ergocalciferol (VITAMIN D2) 50000 UNITS capsule Take 50,000 Units by mouth once a week. Take on Fridays    . Febuxostat (ULORIC) 80 MG TABS Take 80 mg by mouth at bedtime.     . fesoterodine (TOVIAZ) 4 MG TB24 Take 4 mg by mouth daily.      . fluticasone (FLONASE) 50 MCG/ACT nasal spray Place 1 spray into both nostrils as needed for allergies or rhinitis. Reported on 08/05/2015  3  . gabapentin (NEURONTIN) 100 MG capsule TAKE ONE CAPSULE BY MOUTH EVERY MORNING AND 2 CAPS IN THE AFTERNOON AND 2 CAPS AT BEDTIME (Patient taking differently: Take 100 mg by mouth 4 (four) times daily. TAKE ONE CAPSULE BY MOUTH EVERY MORNING AND 1 CAPS AT LUNCH TIME, 1 AT [facility] AND 2 CAPS AT BEDTIME) 480 capsule 3  . guaiFENesin (MUCINEX) 600 MG 12 hr tablet Take 600 mg by mouth 2 (two) times daily.     [name] HUMULIN R U-500 KWIKPEN 500 UNIT/ML injection INJECT (110 UNITS) BEFORE BREAKFAST AND (50 UNITS) BEFORE EVENING MEAL SUBCUTANEOUSLY TWICE A DAY  0  . HYDROcodone-acetaminophen (NORCO) 5-325 MG tablet Take 1 tablet by mouth every 6 (six)  hours as needed for moderate pain. 30 tablet 0  . INVOKANA 100 MG TABS tablet Take 100 mg by mouth daily.  99  . ipratropium (ATROVENT) 0.02 % nebulizer solution Take 0.5 mg by nebulization 4 (four) times daily as needed for wheezing or shortness of breath.    . ketoconazole (NIZORAL) 2 % cream Apply 1 application topically daily. 15 g 1  . LORazepam (ATIVAN) 0.5 MG tablet Take 1 tablet by mouth 30 minutes before radiotherapy, PRN nausea 12 tablet 0  . losartan (COZAAR) 100 MG tablet TAKE 1 TABLET (100MG) BY MOUTH DAILY. 90 tablet 2  . meclizine (ANTIVERT) 12.5 MG tablet Take 12.5 mg by mouth at bedtime.   6  . megestrol (MEGACE) 40 MG tablet Take 2 tablets (80 mg total) by mouth 3 (three) times daily. 180 tablet 3  . metolazone (ZAROXOLYN) 5 MG tablet TAKE 1 TABLET (5 MG TOTAL) BY MOUTH SEE ADMIN INSTRUCTIONS. 3 TIMES WEEKLY AS NEEDED FOR EDEMA 15 tablet 3  . Multiple Vitamin (MULTIVITAMIN) capsule Take 1 capsule by mouth daily.      . mupirocin ointment (BACTROBAN) 2 % APPLY 1 APPLICATION TO AFFECTED AREA 3 TIMES DAILY AS NEEDED FOR INFECTION  0  . Omega-3 Fatty Acids (OMEGA-3 FISH OIL) 1200 MG CAPS Take 2 capsules (2,400 mg total) by mouth 2 (two) times daily. 120 capsule 6  . omeprazole (PRILOSEC) 40 MG capsule Take 40 mg by mouth 2 (two) times daily.     [name] oxyCODONE-acetaminophen (PERCOCET/ROXICET) 5-325 MG tablet Take 1 tablet 30 minutes before radiation treatments or scans. 5 tablet 0  . phenazopyridine (PYRIDIUM) 95 MG tablet Take 1 tablet (95 mg total) by mouth 3 (three) times daily as needed for pain. 15 tablet 0  .  potassium chloride SA (K-DUR,KLOR-CON) 20 MEQ tablet Take 20 mEq by mouth 4 (four) times daily. 1 in AM, 2 at lunch, 2 at supper and 1 at bedtime    . primidone (MYSOLINE) 50 MG tablet Take 1 tablet (50 mg total) by mouth 2 (two) times daily. 180 tablet 3  . Probiotic Product (PROBIOTIC PO) Take 1 tablet by mouth daily.     . promethazine (PHENERGAN) 25 MG tablet Take 1 tablet  (25 mg total) by mouth every 4 (four) hours as needed for nausea or vomiting. 60 tablet 3  . promethazine-codeine (PHENERGAN WITH CODEINE) 6.25-10 MG/5ML syrup TAKE 1-2 TEASPOONSFUL BY MOUTH EVERY 6 HOURS  0  . warfarin (COUMADIN) 5 MG tablet Take 2.5 mg by mouth daily at 6 PM.     . insulin regular human CONCENTRATED (HUMULIN R) 500 UNIT/ML SOLN injection Inject 40-110 Units into the skin 2 (two) times daily before a meal. Patient injects 110 units am before breakfast and pm 40 units before dinner.    [name] VICTOZA 18 MG/3ML SOPN Reported on 08/05/2015  2    Results for orders placed or performed during the hospital encounter of 08/15/15 (from the past 48 hour(s))  CBC with Differential     Status: Abnormal   Collection Time: 08/15/15 12:38 PM  Result Value Ref Range   WBC 5.4 4.0 - 10.5 K/uL   RBC 4.43 3.87 - 5.11 MIL/uL   Hemoglobin 13.8 12.0 - 15.0 g/dL   HCT 38.9 36.0 - 46.0 %   MCV 87.8 78.0 - 100.0 fL   MCH 31.2 26.0 - 34.0 pg   MCHC 35.5 30.0 - 36.0 g/dL   RDW 14.5 11.5 - 15.5 %   Platelets 147 (L) 150 - 400 K/uL   Neutrophils Relative % 66 %   Neutro Abs 3.6 1.7 - 7.7 K/uL   Lymphocytes Relative 19 %   Lymphs Abs 1.0 0.7 - 4.0 K/uL   Monocytes Relative 13 %   Monocytes Absolute 0.7 0.1 - 1.0 K/uL   Eosinophils Relative 2 %   Eosinophils Absolute 0.1 0.0 - 0.7 K/uL   Basophils Relative 0 %   Basophils Absolute 0.0 0.0 - 0.1 K/uL  Comprehensive metabolic panel     Status: Abnormal   Collection Time: 08/15/15 12:38 PM  Result Value Ref Range   Sodium 125 (L) 135 - 145 mmol/L   Potassium 3.4 (L) 3.5 - 5.1 mmol/L   Chloride 84 (L) 101 - 111 mmol/L   CO2 25 22 - 32 mmol/L   Glucose, Bld 790 (HH) 65 - 99 mg/dL    Comment: CRITICAL RESULT CALLED TO, READ BACK BY AND VERIFIED WITH: H.HALL,RN 08/15/15 1322 BY BSLADE    BUN 14 6 - 20 mg/dL   Creatinine, Ser 1.31 (H) 0.44 - 1.00 mg/dL   Calcium 9.4 8.9 - 10.3 mg/dL   Total Protein 7.8 6.5 - 8.1 g/dL   Albumin 3.4 (L) 3.5 - 5.0  g/dL   AST 29 15 - 41 U/L   ALT 25 14 - 54 U/L   Alkaline Phosphatase 75 38 - 126 U/L   Total Bilirubin 0.9 0.3 - 1.2 mg/dL   GFR calc non Af Amer 46 (L) >60 mL/min   GFR calc Af Amer 53 (L) >60 mL/min    Comment: (NOTE) The eGFR has been calculated using the CKD EPI equation. This calculation has not been validated in all clinical situations. eGFR's persistently <60 mL/min signify possible Chronic Kidney Disease.  Anion gap 16 (H) 5 - 15  Brain natriuretic peptide     Status: None   Collection Time: 08/15/15 12:38 PM  Result Value Ref Range   B Natriuretic Peptide 46.8 0.0 - 100.0 pg/mL  Protime-INR     Status: Abnormal   Collection Time: 08/15/15 12:38 PM  Result Value Ref Range   Prothrombin Time 36.5 (H) 11.6 - 15.2 seconds   INR 3.80 (H) 0.00 - 1.49  I-Stat Troponin, ED (not at [facility])     Status: None   Collection Time: 08/15/15 12:44 PM  Result Value Ref Range   Troponin i, poc 0.03 0.00 - 0.08 ng/mL   Comment 3            Comment: Due to the release kinetics of cTnI, a negative result within the first hours of the onset of symptoms does not rule out myocardial infarction with certainty. If myocardial infarction is still suspected, repeat the test at appropriate intervals.   I-Stat Venous Blood Gas, ED (order at [facility] and [facility] only)     Status: Abnormal   Collection Time: 08/15/15  2:33 PM  Result Value Ref Range   pH, Ven 7.380 (H) 7.250 - 7.300   pCO2, Ven 53.2 (H) 45.0 - 50.0 mmHg   pO2, Ven 30.0 30.0 - 45.0 mmHg   Bicarbonate 31.4 (H) 20.0 - 24.0 mEq/L   TCO2 33 0 - 100 mmol/L   O2 Saturation 55.0 %   Acid-Base Excess 5.0 (H) 0.0 - 2.0 mmol/L   Patient temperature HIDE    Sample type VENOUS    Comment NOTIFIED PHYSICIAN   Urinalysis, Routine w reflex microscopic (not at [facility])     Status: Abnormal   Collection Time: 08/15/15  2:57 PM  Result Value Ref Range   Color, Urine YELLOW YELLOW   APPearance CLEAR CLEAR   Specific Gravity, Urine 1.033 (H) 1.005 - 1.030    pH 6.0 5.0 - 8.0   Glucose, UA >1000 (A) NEGATIVE mg/dL   Hgb urine dipstick NEGATIVE NEGATIVE   Bilirubin Urine NEGATIVE NEGATIVE   Ketones, ur NEGATIVE NEGATIVE mg/dL   Protein, ur NEGATIVE NEGATIVE mg/dL   Nitrite NEGATIVE NEGATIVE   Leukocytes, UA NEGATIVE NEGATIVE  Urine microscopic-add on     Status: Abnormal   Collection Time: 08/15/15  2:57 PM  Result Value Ref Range   Squamous Epithelial / LPF 0-5 (A) NONE SEEN   WBC, UA 0-5 0 - 5 WBC/hpf   RBC / HPF 0-5 0 - 5 RBC/hpf   Bacteria, UA FEW (A) NONE SEEN   Urine-Other YEAST PRESENT   CBG monitoring, ED     Status: Abnormal   Collection Time: 08/15/15  4:27 PM  Result Value Ref Range   Glucose-Capillary >600 (HH) 65 - 99 mg/dL  CBG monitoring, ED     Status: Abnormal   Collection Time: 08/15/15  5:00 PM  Result Value Ref Range   Glucose-Capillary >600 (HH) 65 - 99 mg/dL  Basic metabolic panel     Status: Abnormal   Collection Time: 08/15/15  5:17 PM  Result Value Ref Range   Sodium 127 (L) 135 - 145 mmol/L   Potassium 3.7 3.5 - 5.1 mmol/L   Chloride 87 (L) 101 - 111 mmol/L   CO2 24 22 - 32 mmol/L   Glucose, Bld 592 (HH) 65 - 99 mg/dL    Comment: CRITICAL RESULT CALLED TO, READ BACK BY AND VERIFIED WITH: C GROSS,RN 1801 08/15/2015 WBOND  BUN 13 6 - 20 mg/dL   Creatinine, Ser 0.94 0.44 - 1.00 mg/dL   Calcium 9.0 8.9 - 10.3 mg/dL   GFR calc non Af Amer >60 >60 mL/min   GFR calc Af Amer >60 >60 mL/min    Comment: (NOTE) The eGFR has been calculated using the CKD EPI equation. This calculation has not been validated in all clinical situations. eGFR's persistently <60 mL/min signify possible Chronic Kidney Disease.    Anion gap 16 (H) 5 - 15  CBG monitoring, ED     Status: Abnormal   Collection Time: 08/15/15  6:00 PM  Result Value Ref Range   Glucose-Capillary 527 (H) 65 - 99 mg/dL  CBG monitoring, ED     Status: Abnormal   Collection Time: 08/15/15  6:56 PM  Result Value Ref Range   Glucose-Capillary 514 (H)  65 - 99 mg/dL  Basic metabolic panel     Status: Abnormal   Collection Time: 08/15/15  7:40 PM  Result Value Ref Range   Sodium 129 (L) 135 - 145 mmol/L   Potassium 3.5 3.5 - 5.1 mmol/L    Comment: SLIGHT HEMOLYSIS   Chloride 91 (L) 101 - 111 mmol/L   CO2 20 (L) 22 - 32 mmol/L   Glucose, Bld 485 (H) 65 - 99 mg/dL   BUN 13 6 - 20 mg/dL   Creatinine, Ser 0.97 0.44 - 1.00 mg/dL   Calcium 8.9 8.9 - 10.3 mg/dL   GFR calc non Af Amer >60 >60 mL/min   GFR calc Af Amer >60 >60 mL/min    Comment: (NOTE) The eGFR has been calculated using the CKD EPI equation. This calculation has not been validated in all clinical situations. eGFR's persistently <60 mL/min signify possible Chronic Kidney Disease.    Anion gap 18 (H) 5 - 15  CBG monitoring, ED     Status: Abnormal   Collection Time: 08/15/15  7:53 PM  Result Value Ref Range   Glucose-Capillary 451 (H) 65 - 99 mg/dL  CBG monitoring, ED     Status: Abnormal   Collection Time: 08/15/15  8:53 PM  Result Value Ref Range   Glucose-Capillary 416 (H) 65 - 99 mg/dL  CBG monitoring, ED     Status: Abnormal   Collection Time: 08/15/15  9:56 PM  Result Value Ref Range   Glucose-Capillary 378 (H) 65 - 99 mg/dL  CBG monitoring, ED     Status: Abnormal   Collection Time: 08/15/15 11:12 PM  Result Value Ref Range   Glucose-Capillary 351 (H) 65 - 99 mg/dL  Basic metabolic panel     Status: Abnormal   Collection Time: 08/16/15 12:34 AM  Result Value Ref Range   Sodium 132 (L) 135 - 145 mmol/L   Potassium 2.6 (LL) 3.5 - 5.1 mmol/L    Comment: CRITICAL RESULT CALLED TO, READ BACK BY AND VERIFIED WITH: OLDLAND B,RN 08/16/15 0110 WAYK    Chloride 96 (L) 101 - 111 mmol/L   CO2 25 22 - 32 mmol/L   Glucose, Bld 285 (H) 65 - 99 mg/dL   BUN 12 6 - 20 mg/dL   Creatinine, Ser 1.04 (H) 0.44 - 1.00 mg/dL   Calcium 9.0 8.9 - 10.3 mg/dL   GFR calc non Af Amer >60 >60 mL/min   GFR calc Af Amer >60 >60 mL/min    Comment: (NOTE) The eGFR has been calculated  using the CKD EPI equation. This calculation has not been validated in all clinical situations. eGFR's persistently <  60 mL/min signify possible Chronic Kidney Disease.    Anion gap 11 5 - 15  CBG monitoring, ED     Status: Abnormal   Collection Time: 08/16/15  1:07 AM  Result Value Ref Range   Glucose-Capillary 251 (H) 65 - 99 mg/dL  MRSA PCR Screening     Status: Abnormal   Collection Time: 08/16/15  2:54 AM  Result Value Ref Range   MRSA by PCR POSITIVE (A) NEGATIVE    Comment:        The GeneXpert MRSA Assay (FDA approved for NASAL specimens only), is one component of a comprehensive MRSA colonization surveillance program. It is not intended to diagnose MRSA infection nor to guide or monitor treatment for MRSA infections. RESULT CALLED TO, READ BACK BY AND VERIFIED WITH: APRIL WHITLOW,RN @0618  08/16/15 MKELLY   Glucose, capillary     Status: Abnormal   Collection Time: 08/16/15  3:23 AM  Result Value Ref Range   Glucose-Capillary 312 (H) 65 - 99 mg/dL  CBC     Status: Abnormal   Collection Time: 08/16/15  4:09 AM  Result Value Ref Range   WBC 6.5 4.0 - 10.5 K/uL   RBC 4.08 3.87 - 5.11 MIL/uL   Hemoglobin 12.3 12.0 - 15.0 g/dL   HCT 35.8 (L) 36.0 - 46.0 %   MCV 87.7 78.0 - 100.0 fL   MCH 30.1 26.0 - 34.0 pg   MCHC 34.4 30.0 - 36.0 g/dL   RDW 14.6 11.5 - 15.5 %   Platelets 136 (L) 150 - 400 K/uL  Comprehensive metabolic panel     Status: Abnormal   Collection Time: 08/16/15  4:09 AM  Result Value Ref Range   Sodium 133 (L) 135 - 145 mmol/L   Potassium 3.2 (L) 3.5 - 5.1 mmol/L    Comment: DELTA CHECK NOTED   Chloride 93 (L) 101 - 111 mmol/L   CO2 29 22 - 32 mmol/L   Glucose, Bld 360 (H) 65 - 99 mg/dL   BUN 13 6 - 20 mg/dL   Creatinine, Ser 1.11 (H) 0.44 - 1.00 mg/dL   Calcium 8.8 (L) 8.9 - 10.3 mg/dL   Total Protein 6.6 6.5 - 8.1 g/dL   Albumin 2.9 (L) 3.5 - 5.0 g/dL   AST 20 15 - 41 U/L   ALT 20 14 - 54 U/L   Alkaline Phosphatase 60 38 - 126 U/L   Total  Bilirubin 0.8 0.3 - 1.2 mg/dL   GFR calc non Af Amer 56 (L) >60 mL/min   GFR calc Af Amer >60 >60 mL/min    Comment: (NOTE) The eGFR has been calculated using the CKD EPI equation. This calculation has not been validated in all clinical situations. eGFR's persistently <60 mL/min signify possible Chronic Kidney Disease.    Anion gap 11 5 - 15  Protime-INR     Status: Abnormal   Collection Time: 08/16/15  4:09 AM  Result Value Ref Range   Prothrombin Time 34.2 (H) 11.6 - 15.2 seconds   INR 3.47 (H) 0.00 - 1.49  Glucose, capillary     Status: Abnormal   Collection Time: 08/16/15  5:21 AM  Result Value Ref Range   Glucose-Capillary 363 (H) 65 - 99 mg/dL  Glucose, capillary     Status: Abnormal   Collection Time: 08/16/15  6:40 AM  Result Value Ref Range   Glucose-Capillary 371 (H) 65 - 99 mg/dL  Glucose, capillary     Status: Abnormal   Collection Time:  08/16/15  7:46 AM  Result Value Ref Range   Glucose-Capillary 356 (H) 65 - 99 mg/dL  Glucose, capillary     Status: Abnormal   Collection Time: 08/16/15  1:10 PM  Result Value Ref Range   Glucose-Capillary 420 (H) 65 - 99 mg/dL  Basic metabolic panel     Status: Abnormal   Collection Time: 08/16/15  1:14 PM  Result Value Ref Range   Sodium 132 (L) 135 - 145 mmol/L   Potassium 3.3 (L) 3.5 - 5.1 mmol/L   Chloride 93 (L) 101 - 111 mmol/L   CO2 24 22 - 32 mmol/L   Glucose, Bld 437 (H) 65 - 99 mg/dL   BUN 14 6 - 20 mg/dL   Creatinine, Ser 1.14 (H) 0.44 - 1.00 mg/dL   Calcium 9.0 8.9 - 10.3 mg/dL   GFR calc non Af Amer 54 (L) >60 mL/min   GFR calc Af Amer >60 >60 mL/min    Comment: (NOTE) The eGFR has been calculated using the CKD EPI equation. This calculation has not been validated in all clinical situations. eGFR's persistently <60 mL/min signify possible Chronic Kidney Disease.    Anion gap 15 5 - 15  Glucose, capillary     Status: Abnormal   Collection Time: 08/16/15  2:14 PM  Result Value Ref Range   Glucose-Capillary  409 (H) 65 - 99 mg/dL   Dg Chest 2 View  08/15/2015  CLINICAL DATA:  SOB FOR 3 DAYS,HX RTACH EXAM: CHEST - 2 VIEW COMPARISON:  None available FINDINGS: Lungs are clear. Stable cardiomegaly. Stable tracheostomy device. No effusion. No pneumothorax. Visualized skeletal structures are unremarkable. IMPRESSION: Cardiomegaly.  No acute cardiopulmonary disease. Electronically Signed   By: Lucrezia Europe [name]   On: 08/15/2015 13:27     Blood pressure 128/92, pulse 84, temperature 97.9 F (36.6 C), temperature source Oral, resp. rate 16, SpO2 100 %.  PHYSICAL EXAM: Overall appearance:  She is alert and energetic.  Mental status is sharp.  Voice is clear with trach finger occluded.   She remains very large Head:  NCAT Ears: not examined Nose:  Not examined Oral Cavity:  Not examined Oral Pharynx/Hypopharynx/Larynx:  Not examined. Neuro:  Grossly intact Neck:  Indwelling trach is soiled, but stoma is clean and healthy.  Using the flexible laryngoscope per trach tube, the tube is crusted and mostly obstructed.  I could not pass the telescope through the trach tube.    Assessment/Plan Trach tube crusted, likely due to dry winter weather.  Using a small amount of 2% viscous xylocaine, I lubricated the stoma after removing the old soiled tube.  A new tube was placed without difficulty.  She tolerated this well and noted freer breathing and clearer voice afterwards.  Again, using the flexible laryngoscope per trach tube, the telescope now passed freely through the trach into the trachea which was clear to the carina.  She tolerated this well.  The tube was secured with the standard velcro straps.  Will increase her moisture and pulmonary hygiene measures.  This tube is longstanding and the tract well healed.  It would be OK for RT to change the tube in the future as needed.  Recheck my office prn.  Jodi Marble 12/31/6281, 5:05 PM

## 2015-08-16 NOTE — Progress Notes (Signed)
Received inner cannulas for trach patient. Attempted to place inner cannula in and unable to place, kept meeting resistance. Tried to pass suction catheter and was able to with slight resistance, lavaged patient and able to pass catheter easier. Again attempted to place inner cannula in and unsuccessful; inner cannula would go in a third of the way. RN made aware that trach will possibly need to be changed by MD/ENT since XLT. Patient remained stable throughout, no distress.

## 2015-08-17 DIAGNOSIS — E1310 Other specified diabetes mellitus with ketoacidosis without coma: Principal | ICD-10-CM

## 2015-08-17 DIAGNOSIS — E876 Hypokalemia: Secondary | ICD-10-CM

## 2015-08-17 DIAGNOSIS — R739 Hyperglycemia, unspecified: Secondary | ICD-10-CM

## 2015-08-17 DIAGNOSIS — Z930 Tracheostomy status: Secondary | ICD-10-CM

## 2015-08-17 DIAGNOSIS — E119 Type 2 diabetes mellitus without complications: Secondary | ICD-10-CM

## 2015-08-17 DIAGNOSIS — E669 Obesity, unspecified: Secondary | ICD-10-CM

## 2015-08-17 LAB — GLUCOSE, CAPILLARY
GLUCOSE-CAPILLARY: 156 mg/dL — AB (ref 65–99)
GLUCOSE-CAPILLARY: 168 mg/dL — AB (ref 65–99)
GLUCOSE-CAPILLARY: 183 mg/dL — AB (ref 65–99)
GLUCOSE-CAPILLARY: 200 mg/dL — AB (ref 65–99)
GLUCOSE-CAPILLARY: 205 mg/dL — AB (ref 65–99)
Glucose-Capillary: 215 mg/dL — ABNORMAL HIGH (ref 65–99)
Glucose-Capillary: 208 mg/dL — ABNORMAL HIGH (ref 65–99)
Glucose-Capillary: 203 mg/dL — ABNORMAL HIGH (ref 65–99)
Glucose-Capillary: 200 mg/dL — ABNORMAL HIGH (ref 65–99)
Glucose-Capillary: 200 mg/dL — ABNORMAL HIGH (ref 65–99)
Glucose-Capillary: 208 mg/dL — ABNORMAL HIGH (ref 65–99)
Glucose-Capillary: 303 mg/dL — ABNORMAL HIGH (ref 65–99)
Glucose-Capillary: 340 mg/dL — ABNORMAL HIGH (ref 65–99)

## 2015-08-17 LAB — BASIC METABOLIC PANEL
ANION GAP: 8 (ref 5–15)
BUN: 9 mg/dL (ref 6–20)
CHLORIDE: 101 mmol/L (ref 101–111)
CO2: 25 mmol/L (ref 22–32)
Calcium: 8.3 mg/dL — ABNORMAL LOW (ref 8.9–10.3)
Creatinine, Ser: 0.85 mg/dL (ref 0.44–1.00)
GFR calc Af Amer: >60 mL/min (ref >60)
GLUCOSE: 233 mg/dL — AB (ref 65–99)
POTASSIUM: 2.7 mmol/L — AB (ref 3.5–5.1)
SODIUM: 134 mmol/L — AB (ref 135–145)

## 2015-08-17 LAB — PROTIME-INR
INR: 2.5 — AB (ref 0.00–1.49)
Prothrombin Time: 26.7 seconds — ABNORMAL HIGH (ref 11.6–15.2)

## 2015-08-17 LAB — MAGNESIUM: Magnesium: 1.8 mg/dL (ref 1.7–2.4)

## 2015-08-17 MED ORDER — POTASSIUM CHLORIDE CRYS ER 20 MEQ PO TBCR
40.0000 meq | EXTENDED_RELEASE_TABLET | ORAL | Status: AC
  Administered 2015-08-17 (×2): 40 meq via ORAL
  Filled 2015-08-17 (×2): qty 2

## 2015-08-17 MED ORDER — INSULIN REGULAR HUMAN (CONC) 500 UNIT/ML ~~LOC~~ SOLN: 110.0000 [IU] | Freq: Every day | SUBCUTANEOUS | Status: DC

## 2015-08-17 MED ORDER — MUPIROCIN 2 % EX OINT
1.0000 "application " | TOPICAL_OINTMENT | Freq: Two times a day (BID) | CUTANEOUS | Status: DC
  Administered 2015-08-17 – 2015-08-19 (×5): 1 via NASAL
  Filled 2015-08-17 (×2): qty 22

## 2015-08-17 MED ORDER — INSULIN GLARGINE 100 UNIT/ML ~~LOC~~ SOLN
50.0000 [IU] | Freq: Every day | SUBCUTANEOUS | Status: DC
  Administered 2015-08-17: 50 [IU] via SUBCUTANEOUS
  Filled 2015-08-17 (×2): qty 0.5

## 2015-08-17 MED ORDER — INSULIN REGULAR HUMAN (CONC) 500 UNIT/ML ~~LOC~~ SOLN: 40.0000 [IU] | Freq: Two times a day (BID) | SUBCUTANEOUS | Status: DC

## 2015-08-17 MED ORDER — CHLORHEXIDINE GLUCONATE CLOTH 2 % EX PADS
6.0000 | MEDICATED_PAD | Freq: Every day | CUTANEOUS | Status: DC
  Administered 2015-08-17 – 2015-08-18 (×2): 6 via TOPICAL

## 2015-08-17 MED ORDER — POTASSIUM CHLORIDE 10 MEQ/100ML IV SOLN
10.0000 meq | INTRAVENOUS | Status: DC
  Administered 2015-08-17 (×2): 10 meq via INTRAVENOUS
  Filled 2015-08-17 (×4): qty 100

## 2015-08-17 MED ORDER — MAGNESIUM SULFATE 2 GM/50ML IV SOLN
2.0000 g | Freq: Once | INTRAVENOUS | Status: AC
  Administered 2015-08-17: 2 g via INTRAVENOUS
  Filled 2015-08-17: qty 50

## 2015-08-17 MED ORDER — INSULIN ASPART 100 UNIT/ML ~~LOC~~ SOLN
0.0000 [IU] | Freq: Three times a day (TID) | SUBCUTANEOUS | Status: DC
  Administered 2015-08-17: 4 [IU] via SUBCUTANEOUS
  Administered 2015-08-17: 15 [IU] via SUBCUTANEOUS
  Administered 2015-08-18: 20 [IU] via SUBCUTANEOUS
  Administered 2015-08-18: 15 [IU] via SUBCUTANEOUS
  Administered 2015-08-18: 7 [IU] via SUBCUTANEOUS
  Administered 2015-08-19 (×2): 11 [IU] via SUBCUTANEOUS

## 2015-08-17 MED ORDER — LOPERAMIDE HCL 2 MG PO CAPS
2.0000 mg | ORAL_CAPSULE | ORAL | Status: DC | PRN
  Administered 2015-08-17: 2 mg via ORAL
  Filled 2015-08-17: qty 1

## 2015-08-17 MED ORDER — WARFARIN SODIUM 2 MG PO TABS
2.0000 mg | ORAL_TABLET | Freq: Once | ORAL | Status: AC
  Administered 2015-08-17: 2 mg via ORAL
  Filled 2015-08-17 (×2): qty 1

## 2015-08-17 MED ORDER — INSULIN REGULAR HUMAN (CONC) 500 UNIT/ML ~~LOC~~ SOPN
110.0000 [IU] | PEN_INJECTOR | Freq: Every day | SUBCUTANEOUS | Status: DC
  Administered 2015-08-18: 110 [IU] via SUBCUTANEOUS
  Filled 2015-08-17: qty 3

## 2015-08-17 MED ORDER — INSULIN REGULAR HUMAN (CONC) 500 UNIT/ML ~~LOC~~ SOPN
40.0000 [IU] | PEN_INJECTOR | Freq: Every day | SUBCUTANEOUS | Status: DC
  Administered 2015-08-17: 40 [IU] via SUBCUTANEOUS
  Filled 2015-08-17 (×2): qty 3

## 2015-08-17 MED ORDER — INSULIN REGULAR HUMAN (CONC) 500 UNIT/ML ~~LOC~~ SOLN
40.0000 [IU] | Freq: Every day | SUBCUTANEOUS | Status: DC
  Filled 2015-08-17: qty 20

## 2015-08-17 MED ORDER — INSULIN ASPART 100 UNIT/ML ~~LOC~~ SOLN
7.0000 [IU] | Freq: Three times a day (TID) | SUBCUTANEOUS | Status: DC
  Administered 2015-08-17: 7 [IU] via SUBCUTANEOUS

## 2015-08-17 NOTE — Progress Notes (Signed)
PT Cancellation Note  Patient Details Name: [name] MRN: [id number] DOB: April 10, 1961   Cancelled Treatment:    Reason Eval/Treat Not Completed: Fatigue/lethargy limiting ability to participate;Patient declined, no reason specified. Pt states that she is too lightheaded and has diarrhea and cannot participate in therapy right now. She asked that PT would check back later. Will follow up as appropriate.   Colon Branch, SPT Colon Branch 08/17/2015, 11:50 AM

## 2015-08-17 NOTE — Progress Notes (Signed)
Inpatient Diabetes Program Recommendations  AACE/ADA: New Consensus Statement on Inpatient Glycemic Control (2015)  Target Ranges:  Prepandial:   less than 140 mg/dL      Peak postprandial:   less than 180 mg/dL (1-2 hours)      Critically ill patients:  140 - 180 mg/dL   Review of Glycemic Control  Diabetes history:DM2 Outpatient Diabetes medications: U500 110 units with breakfast, U500 50 units with supper, Invokana 100 mg daily Current orders for Inpatient glycemic control: Lantus 50 units daily, Novolog 7 units TID with meals for meal coverage, Novolog 0-20 units TID with meals  Inpatient Diabetes Program Recommendations: Insulin - Basal: Patient has received Lantus 50 units today at 10:10 for transition from IV to SQ insulin. Insulin drip is currently still going and will be continued until around noon. Patient has required a high insulin per hour rate over the past several hours (19.6/18.5/16.2 units/hour). Patient is going to require much more basal than what is currently ordered. Recommend restarting patient on U500 insulin and attending MD to call Dr. Buddy Duty (patient's Endocrinologist) regarding recommendations for inpatient glycemic control.  NOTE: Patient was on an insulin drip yesterday and transitioned to SQ insulin and then back to IV insulin drip by yesterday afternoon due to hyperglycemia. Insulin drip was stopped around 1:17am on 2/21 and basal insulin was not given prior to stopping the drip yesterday. Patient did not receive basal insulin until she was given Lantus 20 units at 5:59 am on 08/16/15. Patient was given Novolog 20 units at 5:59 am on 2/21; U500 110 units at 10:01 am on 08/16/15, U500 50 units at 12:30 on 08/16/15, Novolog 20 units at 13:40 on 08/16/15, and insulin drip was restarted at 16:01 on 08/16/15 with CBG of 409 mg/dl. Since midnight, patient has received a total of 155.5 units of insulin on the drip and over the past 3 hours the drip rates have been 19.6/18.5/16.2  units/hour.  Patient is requiring an excessive amount of insulin per hour to keep glucose in target ranges. Patient has already received Lantus 50 unit at 10:11 today and the insulin drip is still infusing (per protocol, drip will be continued for 2 hours after giving basal insulin) and patient will be given meal coverage and Novolog correction insulin as noted above. Patient is very resistant to insulin and is followed by Dr. Buddy Duty (Endocrinologist) for diabetes control. Patient states that she takes U500 as prescribed ( U500 110 units with breakfast, U500 50 units with supper, Invokana 100 mg daily) and that her glucose still runs in the 200's mg/dl for the most part. Patient is very upset about inpatient glycemic control and reports that she called Dr. Cindra Eves office this morning to ask if Dr. Buddy Duty could help with insulin orders as an inpatient. However, she has not received a call back from Dr. Buddy Duty or his nurse at this point. Strongly recommend that attending MD call Dr. Buddy Duty for physician to physician conversation to ask if Dr. Buddy Duty can review the chart and make insulin recommendations with U500 insulin.   Thanks, Barnie Alderman, RN, MSN, CDE Diabetes Coordinator Inpatient Diabetes Program [phone] (Team Pager from [place] to [place]) [phone] ([place] office) [phone] [facility] office) [phone] [facility] office)

## 2015-08-17 NOTE — Progress Notes (Signed)
[facility] PROGRESS NOTE  [name] [id number] DOB: 09-04-1960 DOA: 08/15/2015 PCP: [name], MD Brief narrative 55 year old morbid obese female with history of uncontrolled type 2 diabetes mellitus on U500 insulin ( sees [name]), OSA, trach dependent, CHF, hypertension who presented to the ED with complaints of subjective dyspnea. Chest x-ray was normal and O2 sats were stable but was found to have CBC of 790 with AG of 16. She was placed on glucose stabilizer and admitted to stepdown.    Assessment/Plan: Uncontrolled type 2 diabetes mellitus with DKA Anion gap closed but has required drip persistently since admission. Drip discontinued this morning and given lantus 20 u . I then fould that she was on U500 ( 110 U am and 50 u pm). Given her poorly controlled diabetes, will consult her endocrinologist for further recommendations and dose adjustment. Check A1C  diabetes coordinator follow up appreciated  S/p trach status with obstruction -h difficulty suctioning the tach.  tube appears to have crusted. Was changed by ENT on 2/21. Recommend to increase the moisture and pulmonary hygiene measures.    Hypokalemia  replenished. Check mg  Essential hypertension Stable. Continue medications.  Morbid obesity  Chronic diastolic CHF Appears euvolemic. Continue home medications.  Hyperlipidemia Continue statin.  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  INR therapeutic   DVT prophylaxis: on comadin  Diet: diabetic   Code Status: full code Family Communication: none at bedside Disposition Plan: transfer to tele   Consultants:  none  Procedures:  None  Antibiotics:  none  HPI/Subjective: Seen and examined reviewed his basic symptoms. Required insulin drip on him yesterday as murmurs was informed double 15. She had received Lantus in the morning followed by U500, 100 u in am and 50 u pm.  Objective: Filed Vitals:   08/17/15 1145 08/17/15 1201  BP:  101/55 112/59  Pulse: 81 78  Temp: 97.8 F (36.6 C)   Resp: 20 19    Intake/Output Summary (Last 24 hours) at 08/17/15 1308 Last data filed at 08/17/15 1200  Gross per 24 hour  Intake 2640.58 ml  Output   1000 ml  Net 1640.58 ml   There were no vitals filed for this visit.  Exam:   General:  Morbidly obese female not in distress  HEENT: Moist mucosa, trached  Chest: clear b/l, no added sounds  Cardiovascular: NS1&S2, no murmurs  Abdomen: soft, NT, ND BS+  Musculoskeletal: Warm, No edema  Data Reviewed: Basic Metabolic Panel:  Recent Labs Lab 08/15/15 1940 08/16/15 0034 08/16/15 0409 08/16/15 1314 08/17/15 0400  NA 129* 132* 133* 132* 134*  K 3.5 2.6* 3.2* 3.3* 2.7*  CL 91* 96* 93* 93* 101  CO2 20* 25 29 24 25   GLUCOSE 485* 285* 360* 437* 233*  BUN 13 12 13 14 9   CREATININE 0.97 1.04* 1.11* 1.14* 0.85  CALCIUM 8.9 9.0 8.8* 9.0 8.3*  MG  --   --   --   --  1.8   Liver Function Tests:  Recent Labs Lab 08/15/15 1238 08/16/15 0409  AST 29 20  ALT 25 20  ALKPHOS 75 60  BILITOT 0.9 0.8  PROT 7.8 6.6  ALBUMIN 3.4* 2.9*   No results for input(s): LIPASE, AMYLASE in the last 168 hours. No results for input(s): AMMONIA in the last 168 hours. CBC:  Recent Labs Lab 08/15/15 1238 08/16/15 0409  WBC 5.4 6.5  NEUTROABS 3.6  --   HGB 13.8 12.3  HCT 38.9 35.8*  MCV 87.8 87.7  PLT 147* 136*   Cardiac Enzymes: No results for input(s): CKTOTAL, CKMB, CKMBINDEX, TROPONINI in the last 168 hours. BNP (last 3 results)  Recent Labs  09/20/14 1155 11/08/14 1016 08/15/15 1238  BNP 12.5 53.8 46.8    ProBNP (last 3 results) No results for input(s): PROBNP in the last 8760 hours.  CBG:  Recent Labs Lab 08/17/15 0532 08/17/15 0649 08/17/15 0729 08/17/15 0926 08/17/15 1052  GLUCAP 200* 208* 200* 183* 168*    Recent Results (from the past 240 hour(s))  MRSA PCR Screening     Status: Abnormal   Collection Time: 08/16/15  2:54 AM  Result Value  Ref Range Status   MRSA by PCR POSITIVE (A) NEGATIVE Final    Comment:        The GeneXpert MRSA Assay (FDA approved for NASAL specimens only), is one component of a comprehensive MRSA colonization surveillance program. It is not intended to diagnose MRSA infection nor to guide or monitor treatment for MRSA infections. RESULT CALLED TO, READ BACK BY AND VERIFIED WITH: APRIL WHITLOW,RN @0618  08/16/15 MKELLY      Studies: No results found.  Scheduled Meds: . atorvastatin  10 mg Oral QHS  . busPIRone  7.5 mg Oral BID  . Chlorhexidine Gluconate Cloth  6 each Topical Q0600  . colchicine  0.6 mg Oral Daily  . DULoxetine  30 mg Oral BID  . gabapentin  100 mg Oral QID  . insulin aspart  0-20 Units Subcutaneous TID WC  . insulin aspart  7 Units Subcutaneous TID WC  . insulin glargine  50 Units Subcutaneous Daily  . losartan  100 mg Oral Daily  . mupirocin ointment  1 application Nasal BID  . nebivolol  10 mg Oral Daily  . sodium chloride flush  3 mL Intravenous Q12H  . warfarin  2 mg Oral ONCE-1800  . Warfarin - Pharmacist Dosing Inpatient   Does not apply q1800   Continuous Infusions: . sodium chloride Stopped (08/17/15 0008)  . dextrose 5 % and 0.45% NaCl 100 mL/hr at 08/17/15 0900  . insulin (NOVOLIN-R) infusion 16.2 Units/hr (08/17/15 1058)      Time spent: 25 minutes    Yunique Dearcos, [place] [facility] Pager [phone] 7PM-7AM, please contact night-coverage at [email or web address], password [facility] 08/17/2015, 1:08 PM  LOS: 2 days

## 2015-08-17 NOTE — Progress Notes (Signed)
CRITICAL VALUE ALERT  Critical value received:  Potassium 2.7  Date of notification:  08/17/15  Time of notification:  0513  Critical value read back: Yes  Nurse who received alert:  Remo Lipps, RN  MD notified (1st page):  Schorr  Time of first page:  [phone]

## 2015-08-17 NOTE — Progress Notes (Signed)
ANTICOAGULATION CONSULT NOTE - Follow Up Consult  Pharmacy Consult:  Coumadin Indication:  History of DVT  Allergies  Allergen Reactions  . Codeine Other (See Comments)    Heart problems  . Molds & Smuts Shortness Of Breath and Rash  . Allopurinol Other (See Comments)    GI problems   . Ciprofloxacin Other (See Comments)    GI problems  . Doxycycline Other (See Comments)    Gastric problems  . Fexofenadine Other (See Comments)    Hurts joints/pain  . Keflex [Cephalexin] Diarrhea and Nausea Only  . Nitroglycerin Er Nausea And Vomiting  . Oritavancin Itching    Mild-moderate itching.   . Sulfa Drugs Cross Reactors Nausea And Vomiting  . Accolate [Zafirlukast] Other (See Comments)     rash  . Amoxicillin-Pot Clavulanate Other (See Comments)     GI problems  . Morphine Other (See Comments)    Nausea and irregular HR(flutter)  . Nystatin Rash  . Septra [Sulfamethoxazole-Trimethoprim] Other (See Comments)    unknown    Patient Measurements: Height = 66 inches Weight = 186 kg  Vital Signs: Temp: 97.9 F (36.6 C) (02/22 0729) Temp Source: Oral (02/22 0729) BP: 115/52 mmHg (02/22 0729) Pulse Rate: 77 (02/22 0729)  Labs:  Recent Labs  08/15/15 1238  08/16/15 0409 08/16/15 1314 08/17/15 0400  HGB 13.8  --  12.3  --   --   HCT 38.9  --  35.8*  --   --   PLT 147*  --  136*  --   --   LABPROT 36.5*  --  34.2*  --  26.7*  INR 3.80*  --  3.47*  --  2.50*  CREATININE 1.31*  < > 1.11* 1.14* 0.85  < > = values in this interval not displayed.  Estimated Creatinine Clearance: 132.8 mL/min (by C-G formula based on Cr of 0.85).     Assessment: 80 YOF who presented to the [facility] on 08/15[facility]15 with SOB. The patient has a chronic trach and was also found to have DKA. Pharmacy was consulted to resume warfarin from PTA for history of DVT.  Patient's INR was supra-therapeutic on admit and Coumadin has been on hold for 2 days.  INR now within desired range; no bleeding  reported.   Goal of Therapy:  INR 2-3    Plan:  - Coumadin 2mg  PO today - Daily PT / INR   Laurene Melendrez D. Mina Marble, PharmD, BCPS Pager:  [phone] 08/17/2015, 10:48 AM

## 2015-08-17 NOTE — Progress Notes (Signed)
New Admission Note: Transfer 2C  Arrival Method: Bed Mental Orientation:  A/O x4 Telemetry: Placed Assessment: Completed Skin: clean dry intact IV: LFA SL Pain: none Tubes: Trach Safety Measures: Safety Fall Prevention Plan has been given, discussed and signed Admission: Completed Unit Orientation: Patient has been orientated to the room, unit and staff.  Family: not family present upon admit  Orders have been reviewed and implemented. Will continue to monitor the patient. Call light has been placed within reach and bed alarm has been activated.   Retta Mac BSN, RN

## 2015-08-17 NOTE — Progress Notes (Signed)
Pt reportedly takes Inner Cannula out of trach to sleep. RT counseled pt on importance of keeping inner cannula during sleep and only taking out when changing. Pt states she understands but will probably need to be reminded at night as she originally came into ED due to not being able to insert inner cannula from increased dried on secretions. RT will continue to monitor.

## 2015-08-18 DIAGNOSIS — M869 Osteomyelitis, unspecified: Secondary | ICD-10-CM

## 2015-08-18 LAB — GLUCOSE, CAPILLARY
GLUCOSE-CAPILLARY: 339 mg/dL — AB (ref 65–99)
GLUCOSE-CAPILLARY: 358 mg/dL — AB (ref 65–99)
GLUCOSE-CAPILLARY: 237 mg/dL — AB (ref 65–99)
GLUCOSE-CAPILLARY: 243 mg/dL — AB (ref 65–99)
Glucose-Capillary: 374 mg/dL — ABNORMAL HIGH (ref 65–99)

## 2015-08-18 LAB — HEMOGLOBIN A1C
Hgb A1c MFr Bld: 14.3 % — ABNORMAL HIGH (ref 4.8–5.6)
MEAN PLASMA GLUCOSE: 364 mg/dL

## 2015-08-18 LAB — BASIC METABOLIC PANEL
Anion gap: 11 (ref 5–15)
BUN: 10 mg/dL (ref 6–20)
CALCIUM: 8.4 mg/dL — AB (ref 8.9–10.3)
CO2: 20 mmol/L — AB (ref 22–32)
CREATININE: 0.93 mg/dL (ref 0.44–1.00)
Chloride: 102 mmol/L (ref 101–111)
GFR calc non Af Amer: >60 mL/min (ref >60)
Glucose, Bld: 375 mg/dL — ABNORMAL HIGH (ref 65–99)
Potassium: 3.4 mmol/L — ABNORMAL LOW (ref 3.5–5.1)
Sodium: 133 mmol/L — ABNORMAL LOW (ref 135–145)

## 2015-08-18 LAB — PROTIME-INR
INR: 2.25 — ABNORMAL HIGH (ref 0.00–1.49)
PROTHROMBIN TIME: 24.7 s — AB (ref 11.6–15.2)

## 2015-08-18 MED ORDER — INSULIN ASPART 100 UNIT/ML ~~LOC~~ SOLN
30.0000 [IU] | Freq: Three times a day (TID) | SUBCUTANEOUS | Status: DC
  Administered 2015-08-18 (×2): 30 [IU] via SUBCUTANEOUS

## 2015-08-18 MED ORDER — INSULIN GLARGINE 100 UNIT/ML ~~LOC~~ SOLN
90.0000 [IU] | Freq: Every day | SUBCUTANEOUS | Status: DC
  Administered 2015-08-18: 90 [IU] via SUBCUTANEOUS
  Filled 2015-08-18 (×2): qty 0.9

## 2015-08-18 MED ORDER — INSULIN ASPART 100 UNIT/ML ~~LOC~~ SOLN
40.0000 [IU] | Freq: Three times a day (TID) | SUBCUTANEOUS | Status: DC
  Administered 2015-08-19 (×2): 40 [IU] via SUBCUTANEOUS

## 2015-08-18 MED ORDER — WARFARIN SODIUM 2.5 MG PO TABS
2.5000 mg | ORAL_TABLET | Freq: Once | ORAL | Status: AC
  Administered 2015-08-18: 2.5 mg via ORAL
  Filled 2015-08-18: qty 1

## 2015-08-18 MED ORDER — INSULIN GLARGINE 100 UNIT/ML ~~LOC~~ SOLN
110.0000 [IU] | Freq: Every day | SUBCUTANEOUS | Status: DC
  Administered 2015-08-19: 110 [IU] via SUBCUTANEOUS
  Filled 2015-08-18: qty 1.1

## 2015-08-18 NOTE — Progress Notes (Addendum)
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 She had abscess in her right hand for which she underwent I&D in 06/16/2015 with culture growing Proteus and MRSA. She was treated with Cubicin until 06/25/2015. He was seen by Dr. Johnnye Sima on 07/12/2015 and plan on treating her with one-month course of doxycycline but could not tolerate it by mouth and one week with abdominal discomfort. Patient presented to the ED with complaints of subjective dyspnea. Chest x-ray was normal and O2 sats were stable but was found to have CBC of 790 with AG of 16. She was placed on glucose stabilizer and admitted to stepdown.    Assessment/Plan: Uncontrolled type 2 diabetes mellitus with DKA Anion gap closed but has required drip persistently since admission. Drip discontinued this morning and given lantus 20 u . I then fould that she was on U500 ( 110 U am and 50 u pm).  Prescription with her endocrinologist Dr. Buddy Duty. Given that pt required almost 270 units insulin on the drip he  recommended placing her on Lantus 90 units daily along with pre-meal aspart 30 units 3 times a day. ( recommended to escalate further as needed). Upon discharge as of the patient to call his office and he would recommend dose on her U 500. If CBG stable she can be discharged home and Dr. Buddy Duty will see her sometime early next week. A1C further elevated to 14.3.  diabetes coordinator follow up appreciated  S/p trach status with obstruction -h difficulty suctioning the tach.  tube appears to have crusted. Was changed by ENT  on 2/21. Recommend to increase the moisture and pulmonary hygiene measures.    Hypokalemia  replenished.  History of right third digit osteomyelitis and abscess of right fingers Status post I&D by [name] ? In dec 2016. Even history of MRSA in cultures patient was started on doxycycline for 1 month course by Dr. Johnnye Sima on 07/13/2015. Patient stopped taking antibiotics after 1 week as she had gastric upset. She is allergies to multiple oral antibiotics. He reports occasional mucus discharge from the digits( rt thumb and 4th digit). She is hesitant to start any new antibiotics. She has an appointment to see Dr. Johnnye Sima on 3/8 and have instructed to keep the appointment.  Essential hypertension Stable. Continue medications.  Morbid obesity  Chronic diastolic CHF Appears euvolemic. Continue home medications.  Hyperlipidemia Continue statin.  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  INR therapeutic  Diarrhea Resolved with Imodium.   DVT prophylaxis: on comadin  Diet: diabetic   Code Status: full code Family Communication: none at bedside Disposition Plan: home possibly tomorrow if fsg stable   Consultants:  none  Procedures:  None  Antibiotics:  none  HPI/Subjective: Seen and examined . Denies any specific symptoms. CBG elevated in 300s. Discussed plan with pt on her insulin regimen while in hospital. No further diarrhea.  Objective: Filed Vitals:   08/18/15 1141 08/18/15 1217  BP: 107/68   Pulse: 88 87  Temp: 99 F (37.2 C)   Resp: 18 18    Intake/Output Summary (Last 24 hours) at 08/18/15 1335 Last data filed at  08/18/15 1015  Gross per 24 hour  Intake   1160 ml  Output   1500 ml  Net   -340 ml   Filed Weights   08/17/15 2038  Weight: 183.9 kg (405 lb 6.8 oz)    Exam:   General:  Morbidly obese female not in distress  HEENT: Moist mucosa, trached  Chest: clear b/l, no added sounds  Cardiovascular: NS1&S2, no murmurs  Abdomen: soft, NT, ND  BS+  Musculoskeletal: Warm, No edema, clean dressing over rt digits  Data Reviewed: Basic Metabolic Panel:  Recent Labs Lab 08/16/15 0034 08/16/15 0409 08/16/15 1314 08/17/15 0400 08/18/15 0641  NA 132* 133* 132* 134* 133*  K 2.6* 3.2* 3.3* 2.7* 3.4*  CL 96* 93* 93* 101 102  CO2 25 29 24 25  20*  GLUCOSE 285* 360* 437* 233* 375*  BUN 12 13 14 9 10   CREATININE 1.04* 1.11* 1.14* 0.85 0.93  CALCIUM 9.0 8.8* 9.0 8.3* 8.4*  MG  --   --   --  1.8  --    Liver Function Tests:  Recent Labs Lab 08/15/15 1238 08/16/15 0409  AST 29 20  ALT 25 20  ALKPHOS 75 60  BILITOT 0.9 0.8  PROT 7.8 6.6  ALBUMIN 3.4* 2.9*   No results for input(s): LIPASE, AMYLASE in the last 168 hours. No results for input(s): AMMONIA in the last 168 hours. CBC:  Recent Labs Lab 08/15/15 1238 08/16/15 0409  WBC 5.4 6.5  NEUTROABS 3.6  --   HGB 13.8 12.3  HCT 38.9 35.8*  MCV 87.8 87.7  PLT 147* 136*   Cardiac Enzymes: No results for input(s): CKTOTAL, CKMB, CKMBINDEX, TROPONINI in the last 168 hours. BNP (last 3 results)  Recent Labs  09/20/14 1155 11/08/14 1016 08/15/15 1238  BNP 12.5 53.8 46.8    ProBNP (last 3 results) No results for input(s): PROBNP in the last 8760 hours.  CBG:  Recent Labs Lab 08/17/15 1702 08/17/15 2011 08/17/15 2355 08/18/15 0747 08/18/15 1140  GLUCAP 303* 340* 374* 339* 358*    Recent Results (from the past 240 hour(s))  MRSA PCR Screening     Status: Abnormal   Collection Time: 08/16/15  2:54 AM  Result Value Ref Range Status   MRSA by PCR POSITIVE (A) NEGATIVE Final    Comment:        The GeneXpert MRSA Assay (FDA approved for NASAL specimens only), is one component of a comprehensive MRSA colonization surveillance program. It is not intended to diagnose MRSA infection nor to guide or monitor treatment for MRSA infections. RESULT CALLED TO, READ BACK BY AND VERIFIED WITH: APRIL WHITLOW,RN @0618  08/16/15 MKELLY      Studies: No  results found.  Scheduled Meds: . atorvastatin  10 mg Oral QHS  . busPIRone  7.5 mg Oral BID  . Chlorhexidine Gluconate Cloth  6 each Topical Q0600  . colchicine  0.6 mg Oral Daily  . DULoxetine  30 mg Oral BID  . gabapentin  100 mg Oral QID  . insulin aspart  0-20 Units Subcutaneous TID WC  . insulin aspart  30 Units Subcutaneous TID WC  . insulin glargine  90 Units Subcutaneous Daily  . losartan  100 mg Oral Daily  . mupirocin ointment  1 application Nasal BID  . nebivolol  10 mg Oral Daily  . sodium chloride flush  3 mL Intravenous Q12H  . warfarin  2.5 mg Oral ONCE-1800  . Warfarin - Pharmacist Dosing Inpatient   Does  not apply q1800   Continuous Infusions: . sodium chloride Stopped (08/17/15 0008)      Time spent: 25 minutes    Zamirah Denny, [place] [facility] Pager [phone] 7PM-7AM, please contact night-coverage at [email or web address], password [facility] 08/18/2015, 1:35 PM  LOS: 3 days

## 2015-08-18 NOTE — Progress Notes (Signed)
RT unable to see patient on 8pm rounds. RT was stuck in a RAPID RESPONSE call. RN checked patient on 2011. Patient was stable. RT will continue to monitor.

## 2015-08-18 NOTE — Trach Care Team (Signed)
[place] Progression Note   Patient Details Name: ALETHEA DIANO MRN: [id number] DOB: 1961-05-11 Today's Date: 08/18/2015   Tracheostomy Assessment    Tracheostomy [place]iley 7 mm Uncuffed (Active)     Tracheostomy [place] 7 mm Uncuffed;Proximal (Active)     Tracheostomy [place] 7 mm Proximal;Uncuffed (Active)  Status Secured 08/18/2015  1:00 PM  Site Assessment Clean;Dry 08/18/2015  1:00 PM  Site Care Dried 08/17/2015 11:15 PM  Inner Cannula Care Changed/new 08/17/2015 11:15 PM  Ties Assessment Secure;Dry 08/18/2015  1:00 PM  Cuff pressure (cm) 0 cm 08/18/2015  8:38 AM  Trach Changed Yes 08/17/2015 11:15 PM  Emergency Equipment at bedside Yes 08/18/2015  1:00 PM     Care Needs     Respiratory Therapy O2 Device: Not Delivered FiO2 (%): 21 % SpO2: 99 %    Speech Language Pathology      Physical Therapy Ambulation/Gait assistance: Supervision PT Recommendation/Assessment: Patient needs continued PT services Follow Up Recommendations: Home health PT, Supervision - Intermittent PT equipment: None recommended by PT (Pt already owns necessary equipment)    Occupational Therapy OT Recommendation/Assessment: Patient needs continued OT Services Follow Up Recommendations: Home health OT, Other (comment) (aide continued) OT Equipment: None recommended by OT    Nutritional Patient's Current Diet: NPO (sips with meds)    Case Management/Social Work      Clinical biochemist Care Team/Provider                  Recommendations [place] Team Members Present-  Doris Cheadle and Vincent Gros, RD   Chronic trach and uses vent at night at home. None at present. Continue to follow.          Amillia Biffle, Jaci Carrel 08/18/2015, 2:39 PM  Scribe for team

## 2015-08-18 NOTE — Progress Notes (Signed)
Physical Therapy Treatment Patient Details Name: [name] MRN: [id number] DOB: 04-02-1961 Today's Date: 08/18/2015    History of Present Illness 55 y.o. female with a hx of chronic diastolic chf, HTN, morbid osbesity, OSA, trach dependence, DM on insulin who presented with complaints of subjective sob. O2 sats were normal. CXR clear. Work up demonstrated glucose of 790 with anion gap of 16.    PT Comments    Making progress. Strength and endurance diminished but stable with short distance ambulation while using a RW for support. SpO2 maintained 98% on room air although moderately dyspneic. Patient will continue to benefit from skilled physical therapy services to further improve independence with functional mobility.   Follow Up Recommendations  Home health PT;Supervision - Intermittent     Equipment Recommendations  None recommended by PT (Pt already owns necessary equipment)    Recommendations for Other Services       Precautions / Restrictions Precautions Precautions: Fall Restrictions Weight Bearing Restrictions: No    Mobility  Bed Mobility               General bed mobility comments: sitting EOB  Transfers Overall transfer level: Needs assistance Equipment used: Rolling walker (2 wheeled) Transfers: Sit to/from Stand Sit to Stand: Supervision         General transfer comment: supervision for safety. Slow to rise, vc for hand placement.  Ambulation/Gait Ambulation/Gait assistance: Supervision Ambulation Distance (Feet): 25 Feet Assistive device: Rolling walker (2 wheeled) Gait Pattern/deviations: Step-through pattern;Decreased stride length;Wide base of support Gait velocity: Decreased Gait velocity interpretation: Below normal speed for age/gender General Gait Details: VC for upright posture, wide base of support with moderate reliance on RW but no buckling or overt loss of balance. Moderately dyspneic with short distance covered today, slightly  further than previous distance. SpO2 98% on room air.    Stairs            Wheelchair Mobility    Modified Rankin (Stroke Patients Only)       Balance                                    Cognition Arousal/Alertness: Awake/alert Behavior During Therapy: WFL for tasks assessed/performed Overall Cognitive Status: Within Functional Limits for tasks assessed                      Exercises      General Comments General comments (skin integrity, edema, etc.): Encouraged to continue frequent OOB mobility with staff.      Pertinent Vitals/Pain Pain Assessment: No/denies pain Pain Intervention(s): Monitored during session    Home Living                      Prior Function            PT Goals (current goals can now be found in the care plan section) Acute Rehab PT Goals Patient Stated Goal: To keep her independence with mobility and to be able to return home PT Goal Formulation: With patient Time For Goal Achievement: 08/30/15 Potential to Achieve Goals: Good Progress towards PT goals: Progressing toward goals    Frequency  Min 3X/week    PT Plan Current plan remains appropriate    Co-evaluation             End of Session Equipment Utilized During Treatment: Gait belt Activity Tolerance: Patient tolerated  treatment well Patient left: with call bell/phone within reach;in bed     Time: 1056-1106 PT Time Calculation (min) (ACUTE ONLY): 10 min  Charges:  $Gait Training: 8-22 mins                    G Codes:      Ellouise Newer 08-31-2015, 11:42 AM  Elayne Snare, [place]

## 2015-08-18 NOTE — Progress Notes (Signed)
ANTICOAGULATION CONSULT NOTE - Follow Up Consult  Pharmacy Consult:  warfarin Indication:  History of DVT  Allergies  Allergen Reactions  . Codeine Other (See Comments)    Heart problems  . Molds & Smuts Shortness Of Breath and Rash  . Allopurinol Other (See Comments)    GI problems   . Ciprofloxacin Other (See Comments)    GI problems  . Doxycycline Other (See Comments)    Gastric problems  . Fexofenadine Other (See Comments)    Hurts joints/pain  . Keflex [Cephalexin] Diarrhea and Nausea Only  . Nitroglycerin Er Nausea And Vomiting  . Oritavancin Itching    Mild-moderate itching.   . Sulfa Drugs Cross Reactors Nausea And Vomiting  . Accolate [Zafirlukast] Other (See Comments)     rash  . Amoxicillin-Pot Clavulanate Other (See Comments)     GI problems  . Morphine Other (See Comments)    Nausea and irregular HR(flutter)  . Nystatin Rash  . Septra [Sulfamethoxazole-Trimethoprim] Other (See Comments)    unknown    Patient Measurements: Height = 66 inches Weight = 186 kg  Vital Signs: Temp: 98.2 F (36.8 C) (02/23 0750) Temp Source: Oral (02/23 0750) BP: 133/72 mmHg (02/23 0750) Pulse Rate: 90 (02/23 0838)  Labs:  Recent Labs  08/15/15 1238  08/16/15 0409 08/16/15 1314 08/17/15 0400 08/18/15 0641  HGB 13.8  --  12.3  --   --   --   HCT 38.9  --  35.8*  --   --   --   PLT 147*  --  136*  --   --   --   LABPROT 36.5*  --  34.2*  --  26.7* 24.7*  INR 3.80*  --  3.47*  --  2.50* 2.25*  CREATININE 1.31*  < > 1.11* 1.14* 0.85 0.93  < > = values in this interval not displayed.  Estimated Creatinine Clearance: 120.5 mL/min (by C-G formula based on Cr of 0.93).   Assessment: 35 YOF who presented to the [facility] on 08/[facility]/15 with SOB. The patient has a chronic trach and was also found to have DKA.  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  INR 2.25  today. Hgb wnl, plts 136- no bleeding noted/  Goal of Therapy:  INR 2-3  Plan:  - Coumadin 2.5mg  PO today as per home dose - Daily PT / INR - Follow for s/s bleeding  Fredick Schlosser D. Izela Altier, PharmD, BCPS Clinical Pharmacist Pager: [phone] 08/18/2015 11:20 AM

## 2015-08-19 DIAGNOSIS — M86241 Subacute osteomyelitis, right hand: Secondary | ICD-10-CM

## 2015-08-19 DIAGNOSIS — Z7901 Long term (current) use of anticoagulants: Secondary | ICD-10-CM

## 2015-08-19 DIAGNOSIS — I5032 Chronic diastolic (congestive) heart failure: Secondary | ICD-10-CM

## 2015-08-19 LAB — CBC
HCT: 35.5 % — ABNORMAL LOW (ref 36.0–46.0)
Hemoglobin: 11.6 g/dL — ABNORMAL LOW (ref 12.0–15.0)
MCH: 30 pg (ref 26.0–34.0)
MCHC: 32.7 g/dL (ref 30.0–36.0)
MCV: 91.7 fL (ref 78.0–100.0)
PLATELETS: 148 10*3/uL — AB (ref 150–400)
RBC: 3.87 MIL/uL (ref 3.87–5.11)
RDW: 15 % (ref 11.5–15.5)
WBC: 4.3 10*3/uL (ref 4.0–10.5)

## 2015-08-19 LAB — GLUCOSE, CAPILLARY
Glucose-Capillary: 243 mg/dL — ABNORMAL HIGH (ref 65–99)
Glucose-Capillary: 295 mg/dL — ABNORMAL HIGH (ref 65–99)

## 2015-08-19 LAB — PROTIME-INR
INR: 2.4 — ABNORMAL HIGH (ref 0.00–1.49)
PROTHROMBIN TIME: 25.9 s — AB (ref 11.6–15.2)

## 2015-08-19 MED ORDER — INSULIN REGULAR HUMAN (CONC) 500 UNIT/ML ~~LOC~~ SOLN: 60.0000 [IU] | Freq: Two times a day (BID) | SUBCUTANEOUS | Status: DC

## 2015-08-19 MED ORDER — HUMULIN R U-500 KWIKPEN 500 UNIT/ML ~~LOC~~ SOPN: 110.0000 [IU] | PEN_INJECTOR | Freq: Every day | SUBCUTANEOUS | Status: DC

## 2015-08-19 NOTE — Discharge Instructions (Signed)
Diabetic Ketoacidosis °Diabetic ketoacidosis is a life-threatening complication of diabetes. If it is not treated, it can cause severe dehydration and organ damage and can lead to a coma or death. °CAUSES °This condition develops when there is not enough of the hormone insulin in the body. Insulin helps the body to break down sugar for energy. Without insulin, the body cannot break down sugar, so it breaks down fats instead. This leads to the production of acids that are called ketones. Ketones are poisonous at high levels. °This condition can be triggered by: °· Stress on the body that is brought on by an illness. °· Medicines that raise blood glucose levels. °· Not taking diabetes medicine. °SYMPTOMS °Symptoms of this condition include: °· Fatigue. °· Weight loss. °· Excessive thirst. °· Light-headedness. °· Fruity or sweet-smelling breath. °· Excessive urination. °· Vision changes. °· Confusion or irritability. °· Nausea. °· Vomiting. °· Rapid breathing. °· Abdominal pain. °· Feeling flushed. °DIAGNOSIS °This condition is diagnosed based on a medical history, a physical exam, and blood tests. You may also have a urine test that checks for ketones. °TREATMENT °This condition may be treated with: °· Fluid replacement. This may be done to correct dehydration. °· Insulin injections. These may be given through the skin or through an IV tube. °· Electrolyte replacement. Electrolytes, such as potassium and sodium, may be given in pill form or through an IV tube. °· Antibiotic medicines. These may be prescribed if your condition was caused by an infection. °HOME CARE INSTRUCTIONS °Eating and Drinking °· Drink enough fluids to keep your urine clear or pale yellow. °· If you cannot eat, alternate between drinking fluids with sugar (such as juice) and salty fluids (such as broth or bouillon). °· If you can eat, follow your usual diet and drink sugar-free liquids, such as water. °Other Instructions °· Take insulin as  directed by your health care provider. Do not skip insulin injections. Do not use expired insulin. °· If your blood sugar is over 240 mg/dL, monitor your urine ketones every 4-6 hours. °· If you were prescribed an antibiotic medicine, finish all of it even if you start to feel better. °· Rest and exercise only as directed by your health care provider. °· If you get sick, call your health care provider and begin treatment quickly. Your body often needs extra insulin to fight an illness. °· Check your blood glucose levels regularly. If your blood glucose is high, drink plenty of fluids. This helps to flush out ketones. °SEEK MEDICAL CARE IF: °· Your blood glucose level is too high or too low. °· You have ketones in your urine. °· You have a fever. °· You cannot eat. °· You cannot tolerate fluids. °· You have been vomiting for more than 2 hours. °· You continue to have symptoms of this condition. °· You develop new symptoms. °SEEK IMMEDIATE MEDICAL CARE IF: °· Your blood glucose levels continue to be high (elevated). °· Your monitor reads "high" even when you are taking insulin. °· You faint. °· You have chest pain. °· You have trouble breathing. °· You have a sudden, severe headache. °· You have sudden weakness in one arm or one leg. °· You have sudden trouble speaking or swallowing. °· You have vomiting or diarrhea that gets worse after 3 hours. °· You feel severely fatigued. °· You have trouble thinking. °· You have abdominal pain. °· You are severely dehydrated. Symptoms of severe dehydration include: °¨ Extreme thirst. °¨ Dry mouth. °¨ Blue lips. °¨   Cold hands and feet. °¨ Rapid breathing. °  °This information is not intended to replace advice given to you by your health care provider. Make sure you discuss any questions you have with your health care provider. °  °Document Released: 06/08/2000 Document Revised: 10/26/2014 Document Reviewed: 05/19/2014 °[employer] Interactive Patient Education ©2016 [employer] ° °

## 2015-08-19 NOTE — Discharge Summary (Signed)
Physician Discharge Summary  [name] [id number] DOB: 1961/02/08 DOA: 08/15/2015  PCP: [name], MD  Admit date: 08/15/2015 Discharge date: 08/19/2015  Time spent: 35 minutes  Recommendations for Outpatient Follow-up:  Discharge home with outpatient follow-up with endocrinologist on 08/22/2015 at 10:40 AM  Discharge Diagnoses:  Principal Problem:   DKA (diabetic ketoacidoses) ([place])   Active Problems:   Morbid obesity ([place])   Diabetes mellitus type 2 in obese (H[facility])   Obesity (BMI 35.0-39.9 without comorbidity) ([place])   HTN (hypertension), benign   Tracheostomy dependence ([place])   Current use of long term anticoagulation   Chronic diastolic heart failure ([place])   Mixed hyperlipidemia   Hypokalemia   Osteomyelitis, 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[place])    Discharge Condition: Fair  Diet recommendation: Diabetic  Filed Weights   08/17/15 2038 08/18/15 2024  Weight: 183.9 kg (405 lb 6.8 oz) 183.7 kg (404 lb 15.8 oz)    History of present illness:  Please refer to admission H&P for details, in brief, 55 year old morbid obese female with history of uncontrolled type 2 diabetes mellitus on U500 insulin ( sees [name]), OSA, trach dependent, history of endometrial carcinoma (on XRT) CHF, hypertension , 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 She had abscess in her right hand for which she underwent I&D in 06/16/2015 with culture growing Proteus and MRSA. She was treated with Cubicin until 06/25/2015. He was seen by Dr. Johnnye Sima on 07/12/2015 and plan on treating her with one-month course of doxycycline but could not tolerate it by mouth and one week with abdominal discomfort. Patient presented to the ED with complaints of subjective dyspnea. Chest x-ray was normal and O2 sats were stable but was found to have CBC of 790 with AG of 16. She was placed on glucose stabilizer and admitted to stepdown.    Hospital Course:  Uncontrolled type 2 diabetes mellitus with DKA Anion gap closed but has required drip persistently since admission.  Discussed with her endocrinologist Dr. Buddy Duty. Given that pt required almost 270 units insulin on the drip he recommended placing her on higher dose of Lantus 90 units daily along with pre-meal aspart while in the hospital. She was placed on Lantus 110 units and female aspart 40 units 3 times a day. ( Received  total 230 units) A1C further elevated to 14.3. Her CBG have been in the range of 220-240. Discussed with Dr. Buddy Duty this morning again and recommended discharging her on U500 110 units in the morning and 60 units twice a day with lunch and supper. Discontinued Invokana. Pt hasn't been taking victoza for past 2 months. He will see her in the office early next week.   S/p trach status with obstruction Had difficulty suctioning the tach. tube appears to have crusted. Was changed by ENT on 2/21. Recommend to increase the moisture and pulmonary hygiene measures.    Hypokalemia replenished. Tinea supplements at home.  History of right third digit osteomyelitis and abscess of right fingers Status post I&D by [name] ? In dec 2016. had history of MRSA in cultures patient was started on doxycycline for 1 month course by Dr. Johnnye Sima on 07/13/2015. Patient stopped taking antibiotics after 1 week as she had gastric upset. She is allergies to multiple oral antibiotics.  reports occasional mucus discharge from the digits( rt thumb and 4th digit). She is hesitant to start any new antibiotics. She has an appointment to see Dr. Johnnye Sima  on 3/8 and have instructed to keep the appointment.  Essential hypertension Stable. Continue medications.  Morbid obesity  Chronic diastolic CHF Appears euvolemic. Continue home diuretic.  Hyperlipidemia Continue statin.  Hx of DVT pt on chr coumadin. INR therapeutic  Diarrhea Resolved with Imodium.        Code  Status: full code Family Communication: none at bedside Disposition Plan: home with outpt follow up   Consultants:  none  Procedures:  None  Antibiotics:  none  Discharge Exam: Filed Vitals:   08/19/15 0359 08/19/15 0758  BP: 128/77 113/75  Pulse: 77 81  Temp: 99 F (37.2 C) 99 F (37.2 C)  Resp: 19 18     General: Morbidly obese female not in distress  HEENT: Moist mucosa, trached  Chest: clear b/l, no added sounds  Cardiovascular: NS1&S2, no murmurs  Abdomen: soft, NT, ND BS+  Musculoskeletal: Warm, No edema, clean dressing over rt digits, no discharge  Discharge Instructions    Current Discharge Medication List    CONTINUE these medications which have CHANGED   Details  HUMULIN R U-500 KWIKPEN 500 UNIT/ML kwikpen Inject 110 Units into the skin daily with breakfast. Inject 110 units in am, 60 units with lunch and 60 units with supper Qty: 30 mL, Refills: 0    insulin regular human CONCENTRATED (HUMULIN R) 500 UNIT/ML injection Inject 0.12 mLs (60 Units total) into the skin 2 (two) times daily with a meal. Patient injects 110 units am before breakfast and pm 40 units before dinner. Qty: 20 mL, Refills: 0      CONTINUE these medications which have NOT CHANGED   Details  acetaminophen (TYLENOL) 500 MG tablet Take 1,000 mg by mouth every 6 (six) hours as needed for mild pain. For pain    albuterol (PROVENTIL) (2.5 MG/3ML) 0.083% nebulizer solution Take 2.5 mg by nebulization every 4 (four) hours as needed. For shortness of breath    ALPRAZolam (XANAX) 0.5 MG tablet Take 0.5 mg by mouth 2 (two) times daily as needed. For anxiety    atorvastatin (LIPITOR) 10 MG tablet Take 10 mg by mouth at bedtime.     B Complex Vitamins (VITAMIN B COMPLEX PO) Take 1 tablet by mouth daily.     budesonide (PULMICORT) 0.25 MG/2ML nebulizer solution Take 0.25 mg by nebulization 2 (two) times daily as needed (shortness of breath).    bumetanide (BUMEX) 2 MG tablet Take  1 tablet (2 mg total) by mouth daily. May take one extra 2mg  tab daily as needed for edema. Qty: 90 tablet, Refills: 3    busPIRone (BUSPAR) 5 MG tablet Take 7.5 mg by mouth 2 (two) times daily.     BYSTOLIC 10 MG tablet TAKE 1 TABLET (10 MG TOTAL) BY MOUTH DAILY. Qty: 90 tablet, Refills: 3    cholestyramine (QUESTRAN) 4 G packet Take 1 packet by mouth as needed. For loose stools, IBS.    clotrimazole (LOTRIMIN) 1 % cream Apply 1 application topically daily. To stomach    colchicine 0.6 MG tablet Take 0.6 mg by mouth daily.    diphenoxylate-atropine (LOMOTIL) 2.5-0.025 MG tablet Take 1-2 tablets by mouth 4 (four) times daily as needed for diarrhea or loose stools. Qty: 60 tablet, Refills: 1   Associated Diagnoses: Endometrial cancer (HCC)    DULoxetine (CYMBALTA) 30 MG capsule TAKE 1 CAPSULE (30 MG TOTAL) BY MOUTH 2 (TWO) TIMES DAILY. Qty: 180 capsule, Refills: 3    ergocalciferol (VITAMIN D2) 50000 UNITS capsule Take 50,000 Units by mouth  once a week. Take on Fridays    Febuxostat (ULORIC) 80 MG TABS Take 80 mg by mouth at bedtime.     fesoterodine (TOVIAZ) 4 MG TB24 Take 4 mg by mouth daily.      fluticasone (FLONASE) 50 MCG/ACT nasal spray Place 1 spray into both nostrils as needed for allergies or rhinitis. Reported on 08/05/2015 Refills: 3    gabapentin (NEURONTIN) 100 MG capsule TAKE ONE CAPSULE BY MOUTH EVERY MORNING AND 2 CAPS IN THE AFTERNOON AND 2 CAPS AT BEDTIME Qty: 480 capsule, Refills: 3    guaiFENesin (MUCINEX) 600 MG 12 hr tablet Take 600 mg by mouth 2 (two) times daily.     HYDROcodone-acetaminophen (NORCO) 5-325 MG tablet Take 1 tablet by mouth every 6 (six) hours as needed for moderate pain. Qty: 30 tablet, Refills: 0    ipratropium (ATROVENT) 0.02 % nebulizer solution Take 0.5 mg by nebulization 4 (four) times daily as needed for wheezing or shortness of breath.    ketoconazole (NIZORAL) 2 % cream Apply 1 application topically daily. Qty: 15 g, Refills: 1     LORazepam (ATIVAN) 0.5 MG tablet Take 1 tablet by mouth 30 minutes before radiotherapy, PRN nausea Qty: 12 tablet, Refills: 0   Associated Diagnoses: Endometrial cancer (HCC)    losartan (COZAAR) 100 MG tablet TAKE 1 TABLET (100MG ) BY MOUTH DAILY. Qty: 90 tablet, Refills: 2    meclizine (ANTIVERT) 12.5 MG tablet Take 12.5 mg by mouth at bedtime.  Refills: 6    megestrol (MEGACE) 40 MG tablet Take 2 tablets (80 mg total) by mouth 3 (three) times daily. Qty: 180 tablet, Refills: 3   Associated Diagnoses: Endometrial cancer ([place]); Abnormal uterine bleeding    metolazone (ZAROXOLYN) 5 MG tablet TAKE 1 TABLET (5 MG TOTAL) BY MOUTH SEE ADMIN INSTRUCTIONS. 3 TIMES WEEKLY AS NEEDED FOR EDEMA Qty: 15 tablet, Refills: 3    Multiple Vitamin (MULTIVITAMIN) capsule Take 1 capsule by mouth daily.      mupirocin ointment (BACTROBAN) 2 % APPLY 1 APPLICATION TO AFFECTED AREA 3 TIMES DAILY AS NEEDED FOR INFECTION Refills: 0    Omega-3 Fatty Acids (OMEGA-3 FISH OIL) 1200 MG CAPS Take 2 capsules (2,400 mg total) by mouth 2 (two) times daily. Qty: 120 capsule, Refills: 6    omeprazole (PRILOSEC) 40 MG capsule Take 40 mg by mouth 2 (two) times daily.     phenazopyridine (PYRIDIUM) 95 MG tablet Take 1 tablet (95 mg total) by mouth 3 (three) times daily as needed for pain. Qty: 15 tablet, Refills: 0   Associated Diagnoses: Dysuria    potassium chloride SA (K-DUR,KLOR-CON) 20 MEQ tablet Take 20 mEq by mouth 4 (four) times daily. 1 in AM, 2 at lunch, 2 at supper and 1 at bedtime    primidone (MYSOLINE) 50 MG tablet Take 1 tablet (50 mg total) by mouth 2 (two) times daily. Qty: 180 tablet, Refills: 3    Probiotic Product (PROBIOTIC PO) Take 1 tablet by mouth daily.     promethazine (PHENERGAN) 25 MG tablet Take 1 tablet (25 mg total) by mouth every 4 (four) hours as needed for nausea or vomiting. Qty: 60 tablet, Refills: 3   Associated Diagnoses: Endometrial cancer ([place]); Chronic osteomyelitis of  hand, unspecified laterality ([place])    promethazine-codeine (PHENERGAN WITH CODEINE) 6.25-10 MG/5ML syrup TAKE 1-2 TEASPOONSFUL BY MOUTH EVERY 6 HOURS Refills: 0    warfarin (COUMADIN) 5 MG tablet Take 2.5 mg by mouth daily at 6 PM.  STOP taking these medications     INVOKANA 100 MG TABS tablet      oxyCODONE-acetaminophen (PERCOCET/ROXICET) 5-325 MG tablet      VICTOZA 18 MG/3ML SOPN        Allergies  Allergen Reactions  . Codeine Other (See Comments)    Heart problems  . Molds & Smuts Shortness Of Breath and Rash  . Allopurinol Other (See Comments)    GI problems   . Ciprofloxacin Other (See Comments)    GI problems  . Doxycycline Other (See Comments)    Gastric problems  . Fexofenadine Other (See Comments)    Hurts joints/pain  . Keflex [Cephalexin] Diarrhea and Nausea Only  . Nitroglycerin Er Nausea And Vomiting  . Oritavancin Itching    Mild-moderate itching.   . Sulfa Drugs Cross Reactors Nausea And Vomiting  . Accolate [Zafirlukast] Other (See Comments)     rash  . Amoxicillin-Pot Clavulanate Other (See Comments)     GI problems  . Morphine Other (See Comments)    Nausea and irregular HR(flutter)  . Nystatin Rash  . Septra [Sulfamethoxazole-Trimethoprim] Other (See Comments)    unknown   Follow-up Information    Follow up with KERR,JEFFREY, MD On 08/22/2015.   Specialty:  Endocrinology   Why:  10:40 am   Contact information:   [address] [place] [address] [phone]        The results of significant diagnostics from this hospitalization (including imaging, microbiology, ancillary and laboratory) are listed below for reference.    Significant Diagnostic Studies: Dg Chest 2 View  08/15/2015  CLINICAL DATA:  SOB FOR 3 DAYS,HX RTACH EXAM: CHEST - 2 VIEW COMPARISON:  None available FINDINGS: Lungs are clear. Stable cardiomegaly. Stable tracheostomy device. No effusion. No pneumothorax. Visualized skeletal structures are  unremarkable. IMPRESSION: Cardiomegaly.  No acute cardiopulmonary disease. Electronically Signed   By: Lucrezia Europe [name]   On: 08/15/2015 13:27   US Transvaginal Non-ob  08/02/2015  CLINICAL DATA:  Endometrial cancer.  Evaluate endometrium. EXAM: TRANSABDOMINAL AND TRANSVAGINAL ULTRASOUND OF PELVIS TECHNIQUE: Both transabdominal and transvaginal ultrasound examinations of the pelvis were performed. Transabdominal technique was performed for global imaging of the pelvis including uterus, ovaries, adnexal regions, and pelvic cul-de-sac. It was necessary to proceed with endovaginal exam following the transabdominal exam to visualize the endometrium to an adequate degree. COMPARISON:  Pelvic ultrasound dated 04/25/2015. FINDINGS: Uterus Measurements: 10.4 x 6 x 7.5 cm. Predominantly hypoechoic mass within the anterior uterine body measures 3.5 x 2.6 x 3.3 cm, most suggestive of fibroid. Additional hyperechoic mass is seen within the lower posterior uterine body measuring 2.5 x 2.4 x 2.2 cm, also most likely fibroid. An additional smaller subserosal fibroid was described on the earlier ultrasound report of 04/25/2015, not seen separately today clear Endometrium Thickness: 11 mm. Endometrium appears fairly homogeneous in thickness throughout. No mass or fluid identified within the endometrial canal. Several echogenic foci along the endometrial complex are suggestive of punctate calcifications. Right ovary Not seen. No mass or free fluid identified in the adjacent right adnexal region. Left ovary Not seen. No mass or free fluid identified within the left adnexal region. Other findings No abnormal free fluid. IMPRESSION: 1. Endometrial complex appears fairly homogeneous in thickness throughout with a demonstrated measurement of 11 mm. This measurement is within normal limits if patient is premenopausal. Punctate echogenic foci are seen along the proximal margins of the endometrial complex, most suggestive of small  calcifications, of uncertain significance. No  mass or free fluid is identified within the endometrial canal. 2. Probable uterine fibroid within the anterior uterine body measuring 3.5 x 2.6 x 3.3 cm. 3. Additional hyperechoic mass within the lower posterior uterine body measuring 2.5 x 2.4 x 2.2 cm, also most likely fibroid. 4. Ovaries are not able to be seen but there is no mass or free fluid identified within either adnexal region. Electronically Signed   By: Franki Cabot [name]   On: 08/02/2015 15:23   US Pelvis Complete  08/02/2015  CLINICAL DATA:  Endometrial cancer.  Evaluate endometrium. EXAM: TRANSABDOMINAL AND TRANSVAGINAL ULTRASOUND OF PELVIS TECHNIQUE: Both transabdominal and transvaginal ultrasound examinations of the pelvis were performed. Transabdominal technique was performed for global imaging of the pelvis including uterus, ovaries, adnexal regions, and pelvic cul-de-sac. It was necessary to proceed with endovaginal exam following the transabdominal exam to visualize the endometrium to an adequate degree. COMPARISON:  Pelvic ultrasound dated 04/25/2015. FINDINGS: Uterus Measurements: 10.4 x 6 x 7.5 cm. Predominantly hypoechoic mass within the anterior uterine body measures 3.5 x 2.6 x 3.3 cm, most suggestive of fibroid. Additional hyperechoic mass is seen within the lower posterior uterine body measuring 2.5 x 2.4 x 2.2 cm, also most likely fibroid. An additional smaller subserosal fibroid was described on the earlier ultrasound report of 04/25/2015, not seen separately today clear Endometrium Thickness: 11 mm. Endometrium appears fairly homogeneous in thickness throughout. No mass or fluid identified within the endometrial canal. Several echogenic foci along the endometrial complex are suggestive of punctate calcifications. Right ovary Not seen. No mass or free fluid identified in the adjacent right adnexal region. Left ovary Not seen. No mass or free fluid identified within the left adnexal  region. Other findings No abnormal free fluid. IMPRESSION: 1. Endometrial complex appears fairly homogeneous in thickness throughout with a demonstrated measurement of 11 mm. This measurement is within normal limits if patient is premenopausal. Punctate echogenic foci are seen along the proximal margins of the endometrial complex, most suggestive of small calcifications, of uncertain significance. No mass or free fluid is identified within the endometrial canal. 2. Probable uterine fibroid within the anterior uterine body measuring 3.5 x 2.6 x 3.3 cm. 3. Additional hyperechoic mass within the lower posterior uterine body measuring 2.5 x 2.4 x 2.2 cm, also most likely fibroid. 4. Ovaries are not able to be seen but there is no mass or free fluid identified within either adnexal region. Electronically Signed   By: Franki Cabot [name]   On: 08/02/2015 15:23    Microbiology: Recent Results (from the past 240 hour(s))  MRSA PCR Screening     Status: Abnormal   Collection Time: 08/16/15  2:54 AM  Result Value Ref Range Status   MRSA by PCR POSITIVE (A) NEGATIVE Final    Comment:        The GeneXpert MRSA Assay (FDA approved for NASAL specimens only), is one component of a comprehensive MRSA colonization surveillance program. It is not intended to diagnose MRSA infection nor to guide or monitor treatment for MRSA infections. RESULT CALLED TO, READ BACK BY AND VERIFIED WITH: APRIL WHITLOW,RN @0618  08/16/15 MKELLY      Labs: Basic Metabolic Panel:  Recent Labs Lab 08/16/15 0034 08/16/15 0409 08/16/15 1314 08/17/15 0400 08/18/15 0641  NA 132* 133* 132* 134* 133*  K 2.6* 3.2* 3.3* 2.7* 3.4*  CL 96* 93* 93* 101 102  CO2 25 29 24 25  20*  GLUCOSE 285* 360* 437* 233* 375*  BUN 12 13 14  9 10  CREATININE 1.04* 1.11* 1.14* 0.85 0.93  CALCIUM 9.0 8.8* 9.0 8.3* 8.4*  MG  --   --   --  1.8  --    Liver Function Tests:  Recent Labs Lab 08/15/15 1238 08/16/15 0409  AST 29 20  ALT 25 20   ALKPHOS 75 60  BILITOT 0.9 0.8  PROT 7.8 6.6  ALBUMIN 3.4* 2.9*   No results for input(s): LIPASE, AMYLASE in the last 168 hours. No results for input(s): AMMONIA in the last 168 hours. CBC:  Recent Labs Lab 08/15/15 1238 08/16/15 0409 08/19/15 0530  WBC 5.4 6.5 4.3  NEUTROABS 3.6  --   --   HGB 13.8 12.3 11.6*  HCT 38.9 35.8* 35.5*  MCV 87.8 87.7 91.7  PLT 147* 136* 148*   Cardiac Enzymes: No results for input(s): CKTOTAL, CKMB, CKMBINDEX, TROPONINI in the last 168 hours. BNP: BNP (last 3 results)  Recent Labs  09/20/14 1155 11/08/14 1016 08/15/15 1238  BNP 12.5 53.8 46.8    ProBNP (last 3 results) No results for input(s): PROBNP in the last 8760 hours.  CBG:  Recent Labs Lab 08/18/15 0747 08/18/15 1140 08/18/15 1731 08/18/15 2022 08/19/15 0756  GLUCAP 339* 358* 237* 243* 243*       Signed:  Louellen Molder MD.  [facility] 08/19/2015, 10:50 AM

## 2015-08-19 NOTE — Progress Notes (Signed)
[name] to be D/C'd Home per MD order.  Discussed prescriptions and follow up appointments with the patient. Prescriptions given to patient, medication list explained in detail. Pt verbalized understanding.    Medication List    STOP taking these medications        INVOKANA 100 MG Tabs tablet  Generic drug:  canagliflozin     oxyCODONE-acetaminophen 5-325 MG tablet  Commonly known as:  PERCOCET/ROXICET     VICTOZA 18 MG/3ML Sopn  Generic drug:  Liraglutide      TAKE these medications        acetaminophen 500 MG tablet  Commonly known as:  TYLENOL  Take 1,000 mg by mouth every 6 (six) hours as needed for mild pain. For pain     albuterol (2.5 MG/3ML) 0.083% nebulizer solution  Commonly known as:  PROVENTIL  Take 2.5 mg by nebulization every 4 (four) hours as needed. For shortness of breath     ALPRAZolam 0.5 MG tablet  Commonly known as:  XANAX  Take 0.5 mg by mouth 2 (two) times daily as needed. For anxiety     atorvastatin 10 MG tablet  Commonly known as:  LIPITOR  Take 10 mg by mouth at bedtime.     budesonide 0.25 MG/2ML nebulizer solution  Commonly known as:  PULMICORT  Take 0.25 mg by nebulization 2 (two) times daily as needed (shortness of breath).     bumetanide 2 MG tablet  Commonly known as:  BUMEX  Take 1 tablet (2 mg total) by mouth daily. May take one extra 2mg  tab daily as needed for edema.     busPIRone 5 MG tablet  Commonly known as:  BUSPAR  Take 7.5 mg by mouth 2 (two) times daily.     BYSTOLIC 10 MG tablet  Generic drug:  nebivolol  TAKE 1 TABLET (10 MG TOTAL) BY MOUTH DAILY.     cholestyramine 4 g packet  Commonly known as:  QUESTRAN  Take 1 packet by mouth as needed. For loose stools, IBS.     clotrimazole 1 % cream  Commonly known as:  LOTRIMIN  Apply 1 application topically daily. To stomach     colchicine 0.6 MG tablet  Take 0.6 mg by mouth daily.     diphenoxylate-atropine 2.5-0.025 MG tablet  Commonly known as:  LOMOTIL   Take 1-2 tablets by mouth 4 (four) times daily as needed for diarrhea or loose stools.     DULoxetine 30 MG capsule  Commonly known as:  CYMBALTA  TAKE 1 CAPSULE (30 MG TOTAL) BY MOUTH 2 (TWO) TIMES DAILY.     ergocalciferol 50000 units capsule  Commonly known as:  VITAMIN D2  Take 50,000 Units by mouth once a week. Take on Fridays     fluticasone 50 MCG/ACT nasal spray  Commonly known as:  FLONASE  Place 1 spray into both nostrils as needed for allergies or rhinitis. Reported on 08/05/2015     gabapentin 100 MG capsule  Commonly known as:  NEURONTIN  TAKE ONE CAPSULE BY MOUTH EVERY MORNING AND 2 CAPS IN THE AFTERNOON AND 2 CAPS AT BEDTIME     guaiFENesin 600 MG 12 hr tablet  Commonly known as:  MUCINEX  Take 600 mg by mouth 2 (two) times daily.     HUMULIN R U-500 KWIKPEN 500 UNIT/ML kwikpen  Generic drug:  insulin regular human CONCENTRATED  Inject 110 Units into the skin daily with breakfast. Inject 110 units in am, 60 units with  lunch and 60 units with supper     insulin regular human CONCENTRATED 500 UNIT/ML injection  Commonly known as:  HUMULIN R  Inject 0.12 mLs (60 Units total) into the skin 2 (two) times daily with a meal. Patient injects 110 units am before breakfast and pm 40 units before dinner.     HYDROcodone-acetaminophen 5-325 MG tablet  Commonly known as:  NORCO  Take 1 tablet by mouth every 6 (six) hours as needed for moderate pain.     ipratropium 0.02 % nebulizer solution  Commonly known as:  ATROVENT  Take 0.5 mg by nebulization 4 (four) times daily as needed for wheezing or shortness of breath.     ketoconazole 2 % cream  Commonly known as:  NIZORAL  Apply 1 application topically daily.     LORazepam 0.5 MG tablet  Commonly known as:  ATIVAN  Take 1 tablet by mouth 30 minutes before radiotherapy, PRN nausea     losartan 100 MG tablet  Commonly known as:  COZAAR  TAKE 1 TABLET (100MG ) BY MOUTH DAILY.     meclizine 12.5 MG tablet  Commonly  known as:  ANTIVERT  Take 12.5 mg by mouth at bedtime.     megestrol 40 MG tablet  Commonly known as:  MEGACE  Take 2 tablets (80 mg total) by mouth 3 (three) times daily.     metolazone 5 MG tablet  Commonly known as:  ZAROXOLYN  TAKE 1 TABLET (5 MG TOTAL) BY MOUTH SEE ADMIN INSTRUCTIONS. 3 TIMES WEEKLY AS NEEDED FOR EDEMA     multivitamin capsule  Take 1 capsule by mouth daily.     mupirocin ointment 2 %  Commonly known as:  BACTROBAN  APPLY 1 APPLICATION TO AFFECTED AREA 3 TIMES DAILY AS NEEDED FOR INFECTION     Omega-3 Fish Oil 1200 MG Caps  Take 2 capsules (2,400 mg total) by mouth 2 (two) times daily.     omeprazole 40 MG capsule  Commonly known as:  PRILOSEC  Take 40 mg by mouth 2 (two) times daily.     phenazopyridine 95 MG tablet  Commonly known as:  PYRIDIUM  Take 1 tablet (95 mg total) by mouth 3 (three) times daily as needed for pain.     potassium chloride SA 20 MEQ tablet  Commonly known as:  K-DUR,KLOR-CON  Take 20 mEq by mouth 4 (four) times daily. 1 in AM, 2 at lunch, 2 at supper and 1 at bedtime     primidone 50 MG tablet  Commonly known as:  MYSOLINE  Take 1 tablet (50 mg total) by mouth 2 (two) times daily.     PROBIOTIC PO  Take 1 tablet by mouth daily.     promethazine 25 MG tablet  Commonly known as:  PHENERGAN  Take 1 tablet (25 mg total) by mouth every 4 (four) hours as needed for nausea or vomiting.     promethazine-codeine 6.25-10 MG/5ML syrup  Commonly known as:  PHENERGAN with CODEINE  TAKE 1-2 TEASPOONSFUL BY MOUTH EVERY 6 HOURS     TOVIAZ 4 MG Tb24 tablet  Generic drug:  fesoterodine  Take 4 mg by mouth daily.     ULORIC 80 MG Tabs  Generic drug:  Febuxostat  Take 80 mg by mouth at bedtime.     VITAMIN B COMPLEX PO  Take 1 tablet by mouth daily.     warfarin 5 MG tablet  Commonly known as:  COUMADIN  Take 2.5 mg by mouth  daily at 6 PM.        Filed Vitals:   08/19/15 1156 08/19/15 1216  BP:  106/50  Pulse: 86 80   Temp:  99 F (37.2 C)  Resp: 18 17    Skin clean, dry and intact without evidence of skin break down, no evidence of skin tears noted. IV catheter discontinued intact. Site without signs and symptoms of complications. Dressing and pressure applied. Pt denies pain at this time. No complaints noted.  An After Visit Summary was printed and given to the patient. Patient escorted via stretcher, and D/C home via ambulance.  Retta Mac BSN, RN

## 2015-08-26 ENCOUNTER — Inpatient Hospital Stay: Admission: RE | Admit: 2015-08-26 | Payer: Self-pay | Source: Ambulatory Visit | Admitting: Radiation Oncology

## 2015-08-26 NOTE — Progress Notes (Signed)
  Radiation Oncology         [phone] ________________________________  Name: [name] MRN: [id number]  Date: 07/22/2015  DOB: Apr 06, 1961  End of Treatment Note  DIAGNOSIS:    ICD-9-CM ICD-10-CM   1. Endometrial cancer (HCC) 182.0 C54.1 sodium chloride 0.9 % injection 3 mL       Inoperable endometrial cancer.  Cannot determine stage with certainty.  Indication for treatment:  Aggressive palliation with chance of long term remission  Radiation treatment dates:   06/06/2015-07/22/2015  Site/dose:   Pelvis treated to 45 Gy in 25 fractions. Uterine boost treated to additional 5.4 Gy in 3 fractions.  Beams/energy:   3D- Conformal, 15X  Narrative: The patient tolerated radiation treatment relatively well.     Plan: The patient has completed radiation treatment. She declined brachytherapy at [facility]. The patient will return to radiation oncology clinic for routine followup in one month. I advised them to call or return sooner if they have any questions or concerns related to their recovery or treatment.  -----------------------------------  Eppie Gibson, MD  This document serves as a record of services personally performed by Eppie Gibson, MD. It was created on her behalf by Derek Mound, a trained medical scribe. The creation of this record is based on the scribe's personal observations and the provider's statements to them. This document has been checked and approved by the attending provider.

## 2015-08-31 ENCOUNTER — Encounter: Payer: Self-pay | Admitting: Infectious Diseases

## 2015-08-31 ENCOUNTER — Ambulatory Visit (INDEPENDENT_AMBULATORY_CARE_PROVIDER_SITE_OTHER): Payer: Medicare Other | Admitting: Infectious Diseases

## 2015-08-31 VITALS — BP 129/81 | HR 67 | Temp 98.4°F | Ht 65.0 in | Wt >= 6400 oz

## 2015-08-31 DIAGNOSIS — L0390 Cellulitis, unspecified: Secondary | ICD-10-CM

## 2015-08-31 DIAGNOSIS — M86642 Other chronic osteomyelitis, left hand: Secondary | ICD-10-CM

## 2015-08-31 DIAGNOSIS — L0291 Cutaneous abscess, unspecified: Secondary | ICD-10-CM | POA: Diagnosis not present

## 2015-08-31 NOTE — Assessment & Plan Note (Signed)
She is doing well despite short course of anbx.  Will give her anbx if symptomatic worsening.  She has hand f/u pending.  rtc prn.

## 2015-08-31 NOTE — Assessment & Plan Note (Signed)
Resolved.  Will f/u prn
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
## 2015-09-02 ENCOUNTER — Encounter: Payer: Self-pay | Admitting: Radiation Oncology

## 2015-09-02 ENCOUNTER — Ambulatory Visit
Admission: RE | Admit: 2015-09-02 | Discharge: 2015-09-02 | Disposition: A | Discharge status: Home / Self Care | Payer: Medicare Other | Source: Ambulatory Visit | Attending: Radiation Oncology | Admitting: Radiation Oncology

## 2015-09-02 VITALS — BP 146/75 | HR 73 | Temp 98.2°F

## 2015-09-02 DIAGNOSIS — C541 Malignant neoplasm of endometrium: Secondary | ICD-10-CM

## 2015-09-02 NOTE — Progress Notes (Signed)
.  Radiation Oncology         [phone] ________________________________  Name: [name] MRN: [id number]  Date: 09/02/2015  DOB: Feb 20, 1961  Follow-Up Visit Note  Outpatient  CC: [name], MD  Fermin Schwab Quillian Quince  Diagnosis and Prior Radiotherapy: Inoperable Endometrial Cancer.     ICD-9-CM ICD-10-CM   1. Endometrial cancer (HCC) 182.0 C54.1     Completed 50.4 Gy in 28 fractions of pelvic RT on 07/22/15  Narrative:  Ms. Collingsworth presents for follow up of radiation completed 07/22/15 to her Pelvis. She denies pain. She reports that her skin has improved, but she did have some breakdown while in the hospital for hyperglycemia. She is rarely using any cream for it at this time. She is having normal bowel movements daily. We were unable to get her weight today, but she reports that she was 409 lbs when she was recently weighed. She reports that previous polyuria has resolved since glucose has improved. She reports having diarrhea about once a week. She denies dysuria and urine incontinence. 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                           ALLERGIES:  is allergic to codeine; molds & smuts; allopurinol; ciprofloxacin; doxycycline; fexofenadine; keflex; nitroglycerin er; oritavancin; sulfa drugs cross reactors; accolate; amoxicillin-pot clavulanate; morphine; nystatin; and septra.  Meds: Current Outpatient Prescriptions  Medication Sig Dispense Refill  . acetaminophen (TYLENOL) 500 MG tablet Take 1,000 mg by mouth every 6 (six) hours as needed for mild pain. For pain    . albuterol (PROVENTIL) (2.5 MG/3ML) 0.083% nebulizer solution Take 2.5 mg by nebulization every 4 (four) hours as needed. For shortness of breath    . ALPRAZolam (XANAX) 0.5 MG tablet Take 0.5 mg by mouth 2 (two) times daily as needed. For anxiety    . atorvastatin (LIPITOR) 10 MG tablet Take 10 mg by mouth at bedtime.     . B Complex  Vitamins (VITAMIN B COMPLEX PO) Take 1 tablet by mouth daily.     . budesonide (PULMICORT) 0.25 MG/2ML nebulizer solution Take 0.25 mg by nebulization 2 (two) times daily as needed (shortness of breath).    . bumetanide (BUMEX) 2 MG tablet Take 1 tablet (2 mg total) by mouth daily. May take one extra 2mg  tab daily as needed for edema. 90 tablet 3  . busPIRone (BUSPAR) 5 MG tablet Take 7.5 mg by mouth 2 (two) times daily.     [name] BYSTOLIC 10 MG tablet TAKE 1 TABLET (10 MG TOTAL) BY MOUTH DAILY. 90 tablet 3  . cholestyramine (QUESTRAN) 4 G packet Take 1 packet by mouth as needed. For loose stools, IBS.    . clotrimazole (LOTRIMIN) 1 % cream Apply 1 application topically daily. To stomach    . colchicine 0.6 MG tablet Take 0.6 mg by mouth daily.    . diphenoxylate-atropine (LOMOTIL) 2.5-0.025 MG tablet Take 1-2 tablets by mouth 4 (four) times daily as needed for diarrhea or loose stools. 60 tablet 1  . DULoxetine (CYMBALTA) 30 MG capsule TAKE 1 CAPSULE (30 MG TOTAL) BY MOUTH 2 (TWO) TIMES DAILY. 180 capsule 3  . ergocalciferol (VITAMIN D2) 50000 UNITS capsule Take 50,000 Units by mouth once a week. Take on Fridays    . Febuxostat (ULORIC) 80 MG TABS Take 80 mg by mouth at bedtime.     . fesoterodine (  TOVIAZ) 4 MG TB24 Take 4 mg by mouth daily.      [name] gabapentin (NEURONTIN) 100 MG capsule TAKE ONE CAPSULE BY MOUTH EVERY MORNING AND 2 CAPS IN THE AFTERNOON AND 2 CAPS AT BEDTIME (Patient taking differently: Take 100 mg by mouth 4 (four) times daily. TAKE ONE CAPSULE BY MOUTH EVERY MORNING AND 2 CAPS AT LUNCH TIME AND 2 CAPS AT BEDTIME) 480 capsule 3  . guaiFENesin (MUCINEX) 600 MG 12 hr tablet Take 600 mg by mouth 2 (two) times daily.     [name] HUMULIN R U-500 KWIKPEN 500 UNIT/ML kwikpen Inject 110 Units into the skin daily with breakfast. Inject 110 units in am, 60 units with lunch and 60 units with supper (Patient taking differently: Inject 110 Units into the skin daily with breakfast. Inject 120 units in am,  60 units with lunch and 60 units with supper) 30 mL 0  . HYDROcodone-acetaminophen (NORCO) 5-325 MG tablet Take 1 tablet by mouth every 6 (six) hours as needed for moderate pain. 30 tablet 0  . ipratropium (ATROVENT) 0.02 % nebulizer solution Take 0.5 mg by nebulization 4 (four) times daily as needed for wheezing or shortness of breath.    . ketoconazole (NIZORAL) 2 % cream Apply 1 application topically daily. 15 g 1  . LORazepam (ATIVAN) 0.5 MG tablet Take 1 tablet by mouth 30 minutes before radiotherapy, PRN nausea 12 tablet 0  . losartan (COZAAR) 100 MG tablet TAKE 1 TABLET (100MG ) BY MOUTH DAILY. 90 tablet 2  . meclizine (ANTIVERT) 12.5 MG tablet Take 12.5 mg by mouth at bedtime.   6  . megestrol (MEGACE) 40 MG tablet Take 2 tablets (80 mg total) by mouth 3 (three) times daily. 180 tablet 3  . metolazone (ZAROXOLYN) 5 MG tablet TAKE 1 TABLET (5 MG TOTAL) BY MOUTH SEE ADMIN INSTRUCTIONS. 3 TIMES WEEKLY AS NEEDED FOR EDEMA 15 tablet 3  . Multiple Vitamin (MULTIVITAMIN) capsule Take 1 capsule by mouth daily.      . mupirocin ointment (BACTROBAN) 2 % APPLY 1 APPLICATION TO AFFECTED AREA 3 TIMES DAILY AS NEEDED FOR INFECTION  0  . Omega-3 Fatty Acids (OMEGA-3 FISH OIL) 1200 MG CAPS Take 2 capsules (2,400 mg total) by mouth 2 (two) times daily. 120 capsule 6  . omeprazole (PRILOSEC) 40 MG capsule Take 40 mg by mouth 2 (two) times daily.     . phenazopyridine (PYRIDIUM) 95 MG tablet Take 1 tablet (95 mg total) by mouth 3 (three) times daily as needed for pain. 15 tablet 0  . potassium chloride SA (K-DUR,KLOR-CON) 20 MEQ tablet Take 20 mEq by mouth 4 (four) times daily. 1 in AM, 2 at lunch, 2 at supper and 1 at bedtime    . primidone (MYSOLINE) 50 MG tablet Take 1 tablet (50 mg total) by mouth 2 (two) times daily. 180 tablet 3  . Probiotic Product (PROBIOTIC PO) Take 1 tablet by mouth daily.     . promethazine (PHENERGAN) 25 MG tablet Take 1 tablet (25 mg total) by mouth every 4 (four) hours as needed  for nausea or vomiting. 60 tablet 3  . promethazine-codeine (PHENERGAN WITH CODEINE) 6.25-10 MG/5ML syrup TAKE 1-2 TEASPOONSFUL BY MOUTH EVERY 6 HOURS  0  . triamcinolone cream (KENALOG) 0.1 % APPLY TO AFFECTED AREA DAILY AS NEEDED AS DIRECTED  6  . warfarin (COUMADIN) 5 MG tablet Take 2.5 mg by mouth daily at 6 PM.      No current facility-administered medications for this encounter.  Physical Findings: The patient is in no acute distress. Patient is alert and oriented.  temperature is 98.2 F (36.8 C). Her blood pressure is 146/75 and her pulse is 73. Her oxygen saturation is 100%. .    No abdominal tenderness noted. No pelvis tenderness to palpation.  Skin in perineum in intact and has healed from radiation effects.  Lab Findings: Lab Results  Component Value Date   WBC 4.3 08/19/2015   HGB 11.6* 08/19/2015   HCT 35.5* 08/19/2015   MCV 91.7 08/19/2015   PLT 148* 08/19/2015    Radiographic Findings: Dg Chest 2 View  08/15/2015  CLINICAL DATA:  SOB FOR 3 DAYS,HX RTACH EXAM: CHEST - 2 VIEW COMPARISON:  None available FINDINGS: Lungs are clear. Stable cardiomegaly. Stable tracheostomy device. No effusion. No pneumothorax. Visualized skeletal structures are unremarkable. IMPRESSION: Cardiomegaly.  No acute cardiopulmonary disease. Electronically Signed   By: Lucrezia Europe [name]   On: 08/15/2015 13:27     Impression/Plan: Recovered well from RT; no further vaginal bleeding.  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 I will contact PT/ occupational therapy and speak to them about whether there is a benefit for her to see them on this matter.   She will f/u with GYN onc with sonography in May/June. I will see her in Sept.   ____________________________________   Eppie Gibson, MD   This document serves as a record of services personally performed by Eppie Gibson, MD. It was created on her behalf by Jenell Milliner, a trained medical scribe. The  creation of this record is based on the scribe's personal observations and the provider's statements to them. This document has been checked and approved by the attending provider

## 2015-09-02 NOTE — Progress Notes (Signed)
Ms. Vongphachanh presents for follow up of radiation completed 07/22/15 to her Pelvis. She denies pain. She reports her skin has improved, but she did have some breakdown while in the hospital for hyperglycemia. She is rarely using any cream for it at this time. She reports a decrease in urination since radiation. She is having normal bowel movements daily. We were unable to get her weight today, but she reports she was 409 lbs when she was recently weighed.   BP 146/75 mmHg  Pulse 73  Temp(Src) 98.2 F (36.8 C)  SpO2 100%

## 2015-09-12 ENCOUNTER — Other Ambulatory Visit: Payer: Self-pay | Admitting: Interventional Cardiology

## 2015-09-14 NOTE — Progress Notes (Signed)
Photon Boost Complex Emergency planning/management officer Note  Diagnosis: Uterine Cancer  The patient's CT images from her free-breathing simulation were reviewed to plan her boost treatment to her uterus.  The boost  will be delivered with 6 photon fields using MLCs for custom blocks against bladder, bowel, rectum, with 15 MV photon energy.  This constitutes 6 complex treatment devices. Isodose plan was reviewed and approved. 5.4 Gy in 3 fractions prescribed.  -----------------------------------  Eppie Gibson, MD

## 2015-09-16 ENCOUNTER — Encounter: Payer: Self-pay | Admitting: Neurology

## 2015-09-16 ENCOUNTER — Ambulatory Visit (INDEPENDENT_AMBULATORY_CARE_PROVIDER_SITE_OTHER): Payer: Medicare Other | Admitting: Neurology

## 2015-09-16 VITALS — BP 120/70 | HR 76 | Ht 65.0 in | Wt >= 6400 oz

## 2015-09-16 DIAGNOSIS — E0849 Diabetes mellitus due to underlying condition with other diabetic neurological complication: Secondary | ICD-10-CM | POA: Diagnosis not present

## 2015-09-16 DIAGNOSIS — G250 Essential tremor: Secondary | ICD-10-CM

## 2015-09-16 MED ORDER — PRIMIDONE 50 MG PO TABS: 50.0000 mg | ORAL_TABLET | Freq: Two times a day (BID) | ORAL | Status: DC

## 2015-09-16 MED ORDER — DULOXETINE HCL 30 MG PO CPEP: 30.0000 mg | ORAL_CAPSULE | Freq: Two times a day (BID) | ORAL | Status: DC

## 2015-09-16 MED ORDER — GABAPENTIN 300 MG PO CAPS: 300.0000 mg | ORAL_CAPSULE | Freq: Two times a day (BID) | ORAL | Status: DC

## 2015-09-16 NOTE — Progress Notes (Signed)
Follow-up Visit   Date: 09/16/2015    [name] MRN: [id number] DOB: 09-07-1960   Interim History: [name] is a 55 y.o. 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  The patient was accompanied to the clinic by self.   History of present illness: She was previously a patient of Dr. Erling Cruz, whom she has seen for tremors and peripheral neuropathy for at least the past 10-15 years. She first started having heaviness of her leg left with spells of numbness/tingling since the early 1990s. Her numbness/tingling involves the areas distal to the left knee and right foot. She has more numbness and heaviness than tingling. She does not have burning sensation. She takes Norco which sometimes helps the pain. Last winter (December 2013 - April 2014), she fell six times, but has not fallen since then. She has been ambulating with a rollator since 2012. She started using a cane in 2009 and prior to that was walking independently. When she initially presented to me, she was taking Cymbalta 30mg  BID, neurontin 100mg  QID (9am, noon, 5pm, 10pm), topamax 50mg  daily (remote history of kidney stones), and fish oil. She tried neurontin 600mg , but became very groggy and sleepy. She has tried Lyrica, but developed joint pain and vision problems. She feels the most benefit from Cymbalta because it helps with depression. She reports having decreased sweating, early satiety, dry eyes and dry mouth.   Also about 10+ years ago, she started having tremors of her hands, worse when she is trying to use her hands. Symptoms have been present for at  least the past 10 years. She is currently taking Mysoline 50mg  0.5 tablet in the morning and 1.5 tablet in the evening.  She was evaluated by Dr. Cyndy Freeze for neck pain in 2007 and found not to be a surgical candidate. She has steroid injections in the past.   She has a caregiver that comes 7-days a week for ~5 hours. She has been on disability due to morbid obesity since 1999.  - Follow-up 12/11/2013:  At her last visit, her neurontin was increased to 100/200/200 (9am, 2pm 7pm) which tends to keep her pain adequately controlled.  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  Tremors are much improved since started primidone 50 mg twice daily.   - Follow-up 09/16/2014:  She was able to wean herself off topamax and did not notice any worsening pain.  She remains on neurontin 100/200/200 and cymbalta 90mg .  Tremor and neuropathic pain is mostly well-controlled but she can have bad days every now and then. She is able to walk with a walker around the home and uses a wheelchair only for long distances.  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  Her diabetes is stable.    UPDATE 09/16/2015:  Patient presents for her annual follow-up visit for neuropathy and tremors.  Over the past year, she was diagnosed with endometrial cancer and completed radiation therapy.  She is not a candidate for surgery due to  multiple medical comorbidities.  In December, 2016, she underwent removal of the nail plate of all her fingers due to chronic infection.  Her diabetes has been extremely high with the last HbA1c 14.3 and she has been trying to be better with her diet.  Regarding her tremors and neuropathy, she has some bad days and good days, but overall has not noticed a huge difference.  She does not  have any new neurological complaints.    Medications:  Current Outpatient Prescriptions on File Prior to Visit  Medication Sig Dispense Refill  . acetaminophen (TYLENOL) 500 MG tablet Take 1,000 mg by mouth every 6 (six) hours as needed for mild pain. For pain    . albuterol (PROVENTIL) (2.5 MG/3ML) 0.083% nebulizer solution Take 2.5 mg by nebulization every 4 (four) hours as needed. For shortness of breath    . ALPRAZolam (XANAX) 0.5 MG tablet Take 0.5 mg by mouth 2 (two) times daily as needed. For anxiety    . atorvastatin (LIPITOR) 10 MG tablet Take 10 mg by mouth at bedtime.     . B Complex Vitamins (VITAMIN B COMPLEX PO) Take 1 tablet by mouth daily.     . budesonide (PULMICORT) 0.25 MG/2ML nebulizer solution Take 0.25 mg by nebulization 2 (two) times daily as needed (shortness of breath).    . bumetanide (BUMEX) 2 MG tablet TAKE 1 TABLET (2 MG TOTAL) BY MOUTH DAILY. 90 tablet 0  . busPIRone (BUSPAR) 5 MG tablet Take 7.5 mg by mouth 2 (two) times daily.     [name] BYSTOLIC 10 MG tablet TAKE 1 TABLET (10 MG TOTAL) BY MOUTH DAILY. 90 tablet 3  . cholestyramine (QUESTRAN) 4 G packet Take 1 packet by mouth as needed. For loose stools, IBS.    . clotrimazole (LOTRIMIN) 1 % cream Apply 1 application topically daily. To stomach    . colchicine 0.6 MG tablet Take 0.6 mg by mouth daily.    . diphenoxylate-atropine (LOMOTIL) 2.5-0.025 MG tablet Take 1-2 tablets by mouth 4 (four) times daily as needed for diarrhea or loose stools. 60 tablet 1  . ergocalciferol (VITAMIN D2) 50000 UNITS capsule Take 50,000 Units by mouth once a week. Take on Fridays    . Febuxostat (ULORIC) 80 MG TABS Take 80 mg by mouth at bedtime.     . fesoterodine (TOVIAZ) 4 MG TB24 Take 4 mg by mouth daily.      [name] guaiFENesin (MUCINEX) 600 MG 12 hr tablet Take 600 mg by mouth 2 (two) times daily.     [name] HUMULIN R U-500 KWIKPEN 500 UNIT/ML kwikpen Inject 110 Units into the skin daily with breakfast. Inject 110 units in am, 60 units  with lunch and 60 units with supper (Patient taking differently: Inject 110 Units into the skin daily with breakfast. Inject 120 units in am, 60 units with lunch and 60 units with supper) 30 mL 0  . HYDROcodone-acetaminophen (NORCO) 5-325 MG tablet Take 1 tablet by mouth every 6 (six) hours as needed for moderate pain. 30 tablet 0  . ipratropium (ATROVENT) 0.02 % nebulizer solution Take 0.5 mg by nebulization 4 (four) times daily as needed for wheezing or shortness of breath.    . ketoconazole (NIZORAL) 2 % cream Apply 1 application topically daily. 15 g 1  . LORazepam (ATIVAN) 0.5 MG tablet Take 1 tablet by mouth 30 minutes before radiotherapy, PRN nausea 12 tablet 0  . losartan (COZAAR) 100 MG tablet TAKE 1 TABLET (100MG ) BY MOUTH DAILY. 90 tablet  2  . meclizine (ANTIVERT) 12.5 MG tablet Take 12.5 mg by mouth at bedtime.   6  . megestrol (MEGACE) 40 MG tablet Take 2 tablets (80 mg total) by mouth 3 (three) times daily. 180 tablet 3  . metolazone (ZAROXOLYN) 5 MG tablet TAKE 1 TABLET (5 MG TOTAL) BY MOUTH SEE ADMIN INSTRUCTIONS. 3 TIMES WEEKLY AS NEEDED FOR EDEMA 15 tablet 3  . Multiple Vitamin (MULTIVITAMIN) capsule Take 1 capsule by mouth daily.      . mupirocin ointment (BACTROBAN) 2 % APPLY 1 APPLICATION TO AFFECTED AREA 3 TIMES DAILY AS NEEDED FOR INFECTION  0  . Omega-3 Fatty Acids (OMEGA-3 FISH OIL) 1200 MG CAPS Take 2 capsules (2,400 mg total) by mouth 2 (two) times daily. 120 capsule 6  . omeprazole (PRILOSEC) 40 MG capsule Take 40 mg by mouth 2 (two) times daily.     . phenazopyridine (PYRIDIUM) 95 MG tablet Take 1 tablet (95 mg total) by mouth 3 (three) times daily as needed for pain. 15 tablet 0  . potassium chloride SA (K-DUR,KLOR-CON) 20 MEQ tablet Take 20 mEq by mouth 4 (four) times daily. 1 in AM, 2 at lunch, 2 at supper and 1 at bedtime    . Probiotic Product (PROBIOTIC PO) Take 1 tablet by mouth daily.     . promethazine (PHENERGAN) 25 MG tablet Take 1 tablet (25 mg total) by mouth  every 4 (four) hours as needed for nausea or vomiting. 60 tablet 3  . promethazine-codeine (PHENERGAN WITH CODEINE) 6.25-10 MG/5ML syrup TAKE 1-2 TEASPOONSFUL BY MOUTH EVERY 6 HOURS  0  . triamcinolone cream (KENALOG) 0.1 % APPLY TO AFFECTED AREA DAILY AS NEEDED AS DIRECTED  6  . warfarin (COUMADIN) 5 MG tablet Take 2.5 mg by mouth daily at 6 PM.      No current facility-administered medications on file prior to visit.    Allergies:  Allergies  Allergen Reactions  . Codeine Other (See Comments)    Heart problems  . Molds & Smuts Shortness Of Breath and Rash  . Allopurinol Other (See Comments)    GI problems   . Ciprofloxacin Other (See Comments)    GI problems  . Doxycycline Other (See Comments)    Gastric problems  . Fexofenadine Other (See Comments)    Hurts joints/pain  . Keflex [Cephalexin] Diarrhea and Nausea Only  . Nitroglycerin Er Nausea And Vomiting  . Oritavancin Itching    Mild-moderate itching.   . Sulfa Drugs Cross Reactors Nausea And Vomiting  . Iodinated Diagnostic Agents Other (See Comments)    Uncoded Allergy. Allergen: NTG  . Accolate [Zafirlukast] Other (See Comments)     rash  . Amoxicillin-Pot Clavulanate Other (See Comments)     GI problems  . Morphine Other (See Comments)    Nausea and irregular HR(flutter)  . Nystatin Rash  . Septra [Sulfamethoxazole-Trimethoprim] Other (See Comments)    unknown     Review of Systems:  CONSTITUTIONAL: No fevers, chills, night sweats, or weight loss.  EYES: No visual changes or eye pain ENT: No hearing changes.  No history of nose bleeds.   RESPIRATORY: No cough, wheezing and shortness of breath.   CARDIOVASCULAR: Negative for chest pain, and palpitations.   GI: Negative for abdominal discomfort, blood in stools or black stools.  No recent change in bowel habits.   GU:  No history of incontinence.   MUSCLOSKELETAL: + history of joint pain or swelling.  No myalgias.   SKIN: Negative for lesions, rash,  and  itching.   ENDOCRINE: Negative for cold or heat intolerance, polydipsia or goiter.   PSYCH:  + depression or anxiety symptoms.   NEURO: As Above.   Vital Signs:  BP 120/70 mmHg  Pulse 76  Ht 5\' 5"  (1.651 m)  Wt 409 lb (185.521 kg)  BMI 68.06 kg/m2  SpO2 99%  Neurological Exam: MENTAL STATUS including orientation to time, place, person, recent and remote memory, attention span and concentration, language, and fund of knowledge is normal.  Speech is not dysarthric.  CRANIAL NERVES:  Pupils equal round and reactive to light.  Face is symmetric.  MOTOR: Right hand postural and intention tremor.  Minimal left hand tremor.    Right Upper Extremity:    Left Upper Extremity:    Deltoid  5/5   Deltoid  5/5   Biceps  5/5   Biceps  5/5   Triceps  5/5   Triceps  5/5   Wrist extensors  5/5   Wrist extensors  5/5   Wrist flexors  5/5   Wrist flexors  5/5   Finger extensors  5/5   Finger extensors  5/5   Finger flexors  5/5   Finger flexors  5/5   Dorsal interossei  4/5   Dorsal interossei  4/5   Abductor pollicis  5/5   Abductor pollicis  5/5   Tone (Ashworth scale)  0   Tone (Ashworth scale)  0    Right Lower Extremity:    Left Lower Extremity:    Hip flexors  4+/5   Hip flexors  4+/5   Hip extensors  5/5   Hip extensors  5/5   Knee flexors  5/5   Knee flexors  5/5   Knee extensors  5/5   Knee extensors  5/5   Dorsiflexors  2/5   Dorsiflexors  2/5   Plantarflexors  2/5   Plantarflexors  2/5   Toe extensors  2/5   Toe extensors  2/5   Toe flexors  2/5   Toe flexors  2/5   Tone (Ashworth scale)  0   Tone (Ashworth scale)  0    MSRs:  Right      Left  brachioradialis  2+   brachioradialis  2+   biceps  1+   biceps  1+   triceps  1+   triceps  1+   patellar  0   Patellar  0   ankle jerk  0   ankle jerk  0    SENSORY: All sensory modalities are reduced in gradient pattern distal to the knees and mild changes in the hands.   GAIT: Not assessed, wheelchair-bound  Data: Lab  Results  Component Value Date   HGBA1C 14.3* 08/16/2015   Lab Results  Component Value Date   TSH 1.330 06/11/2014     IMPRESSION: 1. Length-dependent generalized sensorimotor peripheral neuropathy due to diabetes, clinically there is progressive weakness of the feet  - Predominately large fiber > small fiber, at the levels of the knees with mild sensory loss in the hands also and bilateral foot drop   - Increase gabapentin 300mg  twice daily, continue cymbalta 30mg  BID. Previously tried Lyrica (vision changes, swelling), TPM.  - Given her cardiac co-morbidities and morbid obesity, she is not a good candidate for TCAs.   - Encouraged patient to continue tight glycemic control 2. Benign essential tremors   -  Well-controlled on primidone 50mg  BID  -  Not a candidate for propranolol due  to history of asthma  3. Hypoventilation syndrome due to obesity s/p trach, vent-dependent at night   PLAN/RECOMMENDATIONS:  1.  Increase neurontin to 300mg  BID, GFR noted which is mildly reduced 2.  Continue Cymbalta 30mg  twice daily 3.  Continue Mysoline 50mg  twice daily  Return to clinic in 1 year  The duration of this appointment visit was 30 minutes of face-to-face time with the patient.  Greater than 50% of this time was spent in counseling, explanation of diagnosis, planning of further management, and coordination of care.   Thank you for allowing me to participate in patient's care.  If I can answer any additional questions, I would be pleased to do so.    Sincerely,    Rylynn Schoneman K. Posey Pronto, DO

## 2015-09-16 NOTE — Progress Notes (Signed)
Note routed

## 2015-09-18 ENCOUNTER — Other Ambulatory Visit: Payer: Self-pay | Admitting: Interventional Cardiology

## 2015-09-21 ENCOUNTER — Telehealth: Payer: Self-pay

## 2015-09-21 ENCOUNTER — Other Ambulatory Visit: Payer: Self-pay | Admitting: Interventional Cardiology

## 2015-09-21 DIAGNOSIS — C541 Malignant neoplasm of endometrium: Secondary | ICD-10-CM

## 2015-09-21 DIAGNOSIS — N939 Abnormal uterine and vaginal bleeding, unspecified: Secondary | ICD-10-CM

## 2015-09-21 MED ORDER — MEGESTROL ACETATE 40 MG PO TABS: 80.0000 mg | ORAL_TABLET | Freq: Three times a day (TID) | ORAL | Status: DC

## 2015-09-21 NOTE — Telephone Encounter (Signed)
Patient called requesting refill on her Megace 80 MG PO TID , Melissa Cross, APNP updated with patient's request . Refill approved , Megace 80 MG PO TID , QTY 180 , Refills 3. Patient aware of approval fro refill , denies further questions at this time.

## 2015-10-06 ENCOUNTER — Telehealth: Payer: Self-pay | Admitting: Neurology

## 2015-10-06 ENCOUNTER — Other Ambulatory Visit: Payer: Self-pay | Admitting: Neurology

## 2015-10-06 NOTE — Telephone Encounter (Signed)
Please advise 

## 2015-10-06 NOTE — Telephone Encounter (Signed)
Called pharmacy back and answered questions.  OK to continue medications.  Hanzel Pizzo K. Posey Pronto, DO

## 2015-10-06 NOTE — Telephone Encounter (Signed)
[facility] called today regarding [name] 08/22/1960. She wanted to speak with you regarding her medication Primadone and Worferine? Sorry for the spelling. Adonis Huguenin at [facility] would like you to call her at [phone]. She said taking the 2 medications together may decrease the effectiveness. Thank you

## 2015-10-06 NOTE — Telephone Encounter (Signed)
Rx sent 

## 2015-11-11 ENCOUNTER — Ambulatory Visit: Payer: Medicare Other | Admitting: Interventional Cardiology

## 2015-11-15 ENCOUNTER — Telehealth: Payer: Self-pay

## 2015-11-15 NOTE — Telephone Encounter (Signed)
Prior auth for Bystolic 10mg  submitted to [facility].

## 2015-11-22 ENCOUNTER — Ambulatory Visit (HOSPITAL_COMMUNITY)
Admission: RE | Admit: 2015-11-22 | Discharge: 2015-11-22 | Disposition: A | Discharge status: Home / Self Care | Payer: Medicare Other | Source: Ambulatory Visit | Attending: Gynecologic Oncology | Admitting: Gynecologic Oncology

## 2015-11-22 DIAGNOSIS — C541 Malignant neoplasm of endometrium: Secondary | ICD-10-CM | POA: Insufficient documentation

## 2015-11-22 DIAGNOSIS — D259 Leiomyoma of uterus, unspecified: Secondary | ICD-10-CM | POA: Insufficient documentation

## 2015-11-23 ENCOUNTER — Ambulatory Visit
Admission: RE | Admit: 2015-11-23 | Discharge: 2015-11-23 | Disposition: A | Discharge status: Home / Self Care | Payer: Medicare Other | Source: Ambulatory Visit | Attending: Physician Assistant | Admitting: Physician Assistant

## 2015-11-23 ENCOUNTER — Other Ambulatory Visit: Payer: Self-pay | Admitting: Physician Assistant

## 2015-11-23 DIAGNOSIS — R509 Fever, unspecified: Secondary | ICD-10-CM

## 2015-11-23 DIAGNOSIS — R05 Cough: Secondary | ICD-10-CM

## 2015-11-23 DIAGNOSIS — R059 Cough, unspecified: Secondary | ICD-10-CM

## 2015-11-23 DIAGNOSIS — R0602 Shortness of breath: Secondary | ICD-10-CM

## 2015-11-23 DIAGNOSIS — R9389 Abnormal findings on diagnostic imaging of other specified body structures: Secondary | ICD-10-CM

## 2015-11-24 ENCOUNTER — Telehealth: Payer: Self-pay

## 2015-11-24 NOTE — Telephone Encounter (Signed)
Orders received from [facility] to contact the patient to update with results from US Transvaginal Non-OB (stable fibroid uterus ) . Patient contacted and updated , patient states understanding ,denies further questions at this time .

## 2015-11-25 ENCOUNTER — Ambulatory Visit: Payer: Medicare Other | Admitting: Gynecology

## 2015-11-29 ENCOUNTER — Other Ambulatory Visit: Payer: Medicare Other

## 2015-11-29 ENCOUNTER — Encounter (HOSPITAL_COMMUNITY): Payer: Self-pay

## 2015-11-29 ENCOUNTER — Ambulatory Visit (HOSPITAL_COMMUNITY)
Admission: RE | Admit: 2015-11-29 | Discharge: 2015-11-29 | Disposition: A | Discharge status: Home / Self Care | Payer: Medicare Other | Source: Ambulatory Visit | Attending: Physician Assistant | Admitting: Physician Assistant

## 2015-11-29 DIAGNOSIS — R599 Enlarged lymph nodes, unspecified: Secondary | ICD-10-CM | POA: Diagnosis not present

## 2015-11-29 DIAGNOSIS — R918 Other nonspecific abnormal finding of lung field: Secondary | ICD-10-CM | POA: Diagnosis not present

## 2015-11-29 DIAGNOSIS — Z930 Tracheostomy status: Secondary | ICD-10-CM | POA: Insufficient documentation

## 2015-11-29 DIAGNOSIS — R938 Abnormal findings on diagnostic imaging of other specified body structures: Secondary | ICD-10-CM | POA: Diagnosis not present

## 2015-11-29 DIAGNOSIS — R9389 Abnormal findings on diagnostic imaging of other specified body structures: Secondary | ICD-10-CM

## 2015-11-29 LAB — POCT I-STAT CREATININE: CREATININE: 0.8 mg/dL (ref 0.44–1.00)

## 2015-11-29 MED ORDER — IOPAMIDOL (ISOVUE-300) INJECTION 61%
100.0000 mL | Freq: Once | INTRAVENOUS | Status: AC | PRN
  Administered 2015-11-29: 100 mL via INTRAVENOUS

## 2015-11-30 ENCOUNTER — Ambulatory Visit: Payer: Medicare Other | Admitting: Pulmonary Disease

## 2015-12-01 ENCOUNTER — Telehealth: Payer: Self-pay

## 2015-12-01 ENCOUNTER — Ambulatory Visit (INDEPENDENT_AMBULATORY_CARE_PROVIDER_SITE_OTHER): Payer: Medicare Other | Admitting: Pulmonary Disease

## 2015-12-01 ENCOUNTER — Telehealth: Payer: Self-pay | Admitting: Pulmonary Disease

## 2015-12-01 ENCOUNTER — Encounter: Payer: Self-pay | Admitting: Pulmonary Disease

## 2015-12-01 VITALS — BP 132/84 | HR 78 | Ht 65.0 in | Wt >= 6400 oz

## 2015-12-01 DIAGNOSIS — R911 Solitary pulmonary nodule: Secondary | ICD-10-CM | POA: Diagnosis not present

## 2015-12-01 NOTE — Patient Instructions (Signed)
We will schedule for a PET scan. Based on these findings we may schedule you for a CT-guided biopsy.  I will get in touch with your oncologist to discuss further workup and follow-up.  Return to clinic in 1 month.

## 2015-12-01 NOTE — Telephone Encounter (Signed)
Patient called to update Dr Marti Sleigh that she completed her appointment today with Dr Marshell Garfinkel pulmonologist and he requesting the patient follow up . [name], APNP updated , Dr Fermin Schwab updated , waiting for recommendations. The patient is aware that we will contact her with his recommendations.

## 2015-12-01 NOTE — Progress Notes (Signed)
Subjective:    Patient ID: AKYA RITTINGER, female    DOB: Feb 05, 1961, 55 y.o.   MRN: [id number]  HPI Consult for evaluation of abnormal CT scan.  Mrs. Yenser is a 55 year old with morbid obesity, OSA s/p trach. She has a diagnosis of endometrial cancer in July 2016. She has been treated with radiation therapy ending January 2017. Her chief complaints are increased cough, dyspnea, wheezing. She's noticed blood from her tracheostomy site. She was evaluated by ENT Dr. Janace Hoard this week who felt that the tracheostomy is not the source of the bleeding. She had a chest x-ray which showed nodular opacities and a follow-up CT scan which showed multiple rounded opacities highly concerning for metastatic cancer to the lung.  She has history of severe OSA. She's had a tracheostomy placed 16 years ago and is on chronic vent at night. She is a nonsmoker with no known exposures.  DATA: CT chest 11/29/15 Images reviewed. Multiple rounded bilateral opacities concerning for metastatic cancer.  Social History: She is a never smoker, no alcohol, drug use.  Family history: Mother-heart attack, hypertension, clotting disorder. Father-dementia, diabetes, hypertension, pneumonia.  Past Medical History  Diagnosis Date  . Morbid obesity ([place])   . Depressive disorder, not elsewhere classified   . Asthma   . Dyslipidemia   . Clostridium difficile enterocolitis 2012  . Splenomegaly 06/01/2011  . Thrombocytopenia ([place]) 06/01/2011  . Hepatosplenomegaly   . Gout   . GERD (gastroesophageal reflux disease)   . Osteoarthritis   . Polyneuropathy ([place])   . Urinary incontinence   . Amenorrhea   . CHF (congestive heart failure) ([place])   . Hyperlipidemia   . Leg swelling   . Nausea   . Weakness   . Palpitations   . Anginal pain ([place])     no chest pain in years  . Dysrhythmia     heart palpations  . Anxiety   . Pneumonia   . Shortness of breath   . Chronic kidney disease     overactive bladder  . Peripheral  neuropathy ([place])   . DVT (deep venous thrombosis) ([place])   . OSA (obstructive sleep apnea)     uses ventilator ( has trach)  . Diabetes mellitus     type 2  . Irritable bowel syndrome   . PONV (postoperative nausea and vomiting)     just nausea  . Endometrial cancer ([place]) dx'd 01/19/15     Current outpatient prescriptions:  .  acetaminophen (TYLENOL) 500 MG tablet, Take 1,000 mg by mouth every 6 (six) hours as needed for mild pain. For pain, Disp: , Rfl:  .  albuterol (PROVENTIL) (2.5 MG/3ML) 0.083% nebulizer solution, Take 2.5 mg by nebulization every 4 (four) hours as needed. For shortness of breath, Disp: , Rfl:  .  ALPRAZolam (XANAX) 0.5 MG tablet, Take 0.5 mg by mouth 2 (two) times daily as needed. For anxiety, Disp: , Rfl:  .  atorvastatin (LIPITOR) 10 MG tablet, Take 10 mg by mouth at bedtime. , Disp: , Rfl:  .  budesonide (PULMICORT) 0.25 MG/2ML nebulizer solution, Take 0.25 mg by nebulization 2 (two) times daily as needed (shortness of breath)., Disp: , Rfl:  .  bumetanide (BUMEX) 2 MG tablet, TAKE 1 TABLET (2 MG TOTAL) BY MOUTH DAILY., Disp: 90 tablet, Rfl: 0 .  busPIRone (BUSPAR) 5 MG tablet, Take 7.5 mg by mouth 2 (two) times daily. , Disp: , Rfl:  .  BYSTOLIC 10 MG tablet, TAKE 1 TABLET (10 MG  TOTAL) BY MOUTH DAILY., Disp: 60 tablet, Rfl: 0 .  cholestyramine (QUESTRAN) 4 G packet, Take 1 packet by mouth as needed. For loose stools, IBS., Disp: , Rfl:  .  clotrimazole (LOTRIMIN) 1 % cream, Apply 1 application topically daily. To stomach, Disp: , Rfl:  .  colchicine 0.6 MG tablet, Take 0.6 mg by mouth daily., Disp: , Rfl:  .  diphenoxylate-atropine (LOMOTIL) 2.5-0.025 MG tablet, Take 1-2 tablets by mouth 4 (four) times daily as needed for diarrhea or loose stools., Disp: 60 tablet, Rfl: 1 .  DULoxetine (CYMBALTA) 30 MG capsule, Take 1 capsule (30 mg total) by mouth 2 (two) times daily., Disp: 180 capsule, Rfl: 3 .  ergocalciferol (VITAMIN D2) 50000 UNITS capsule, Take 50,000 Units  by mouth once a week. Take on Fridays, Disp: , Rfl:  .  Febuxostat (ULORIC) 80 MG TABS, Take 80 mg by mouth at bedtime. , Disp: , Rfl:  .  fesoterodine (TOVIAZ) 4 MG TB24, Take 4 mg by mouth daily.  , Disp: , Rfl:  .  gabapentin (NEURONTIN) 300 MG capsule, Take 1 capsule (300 mg total) by mouth 2 (two) times daily., Disp: 180 capsule, Rfl: 3 .  guaiFENesin (MUCINEX) 600 MG 12 hr tablet, Take 600 mg by mouth 2 (two) times daily. , Disp: , Rfl:  .  HUMULIN R U-500 KWIKPEN 500 UNIT/ML kwikpen, Inject 110 Units into the skin daily with breakfast. Inject 110 units in am, 60 units with lunch and 60 units with supper (Patient taking differently: Inject 110 Units into the skin daily with breakfast. Inject 120 units in am, 80 units with lunch and 80 units with supper), Disp: 30 mL, Rfl: 0 .  HYDROcodone-acetaminophen (NORCO) 5-325 MG tablet, Take 1 tablet by mouth every 6 (six) hours as needed for moderate pain., Disp: 30 tablet, Rfl: 0 .  ipratropium (ATROVENT) 0.02 % nebulizer solution, Take 0.5 mg by nebulization 4 (four) times daily as needed for wheezing or shortness of breath., Disp: , Rfl:  .  ketoconazole (NIZORAL) 2 % cream, Apply 1 application topically daily., Disp: 15 g, Rfl: 1 .  LORazepam (ATIVAN) 0.5 MG tablet, Take 1 tablet by mouth 30 minutes before radiotherapy, PRN nausea, Disp: 12 tablet, Rfl: 0 .  losartan (COZAAR) 100 MG tablet, TAKE 1 TABLET (100MG ) BY MOUTH DAILY., Disp: 90 tablet, Rfl: 0 .  meclizine (ANTIVERT) 12.5 MG tablet, Take 12.5 mg by mouth at bedtime. , Disp: , Rfl: 6 .  megestrol (MEGACE) 40 MG tablet, Take 2 tablets (80 mg total) by mouth 3 (three) times daily., Disp: 180 tablet, Rfl: 3 .  metolazone (ZAROXOLYN) 5 MG tablet, TAKE 1 TABLET (5 MG TOTAL) BY MOUTH SEE ADMIN INSTRUCTIONS. 3 TIMES WEEKLY AS NEEDED FOR EDEMA, Disp: 15 tablet, Rfl: 3 .  Multiple Vitamin (MULTIVITAMIN) capsule, Take 1 capsule by mouth daily.  , Disp: , Rfl:  .  mupirocin ointment (BACTROBAN) 2 %,  APPLY 1 APPLICATION TO AFFECTED AREA 3 TIMES DAILY AS NEEDED FOR INFECTION, Disp: , Rfl: 0 .  Omega-3 Fatty Acids (OMEGA-3 FISH OIL) 1200 MG CAPS, Take 2 capsules (2,400 mg total) by mouth 2 (two) times daily., Disp: 120 capsule, Rfl: 6 .  omeprazole (PRILOSEC) 40 MG capsule, Take 40 mg by mouth 2 (two) times daily. , Disp: , Rfl:  .  phenazopyridine (PYRIDIUM) 95 MG tablet, Take 1 tablet (95 mg total) by mouth 3 (three) times daily as needed for pain., Disp: 15 tablet, Rfl: 0 .  potassium chloride  SA (K-DUR,KLOR-CON) 20 MEQ tablet, Take 20 mEq by mouth 4 (four) times daily. 1 in AM, 2 at lunch, 2 at supper and 1 at bedtime, Disp: , Rfl:  .  primidone (MYSOLINE) 50 MG tablet, Take 1 tablet (50 mg total) by mouth 2 (two) times daily., Disp: 180 tablet, Rfl: 3 .  Probiotic Product (PROBIOTIC PO), Take 1 tablet by mouth daily. , Disp: , Rfl:  .  promethazine (PHENERGAN) 25 MG tablet, Take 1 tablet (25 mg total) by mouth every 4 (four) hours as needed for nausea or vomiting., Disp: 60 tablet, Rfl: 3 .  promethazine-codeine (PHENERGAN WITH CODEINE) 6.25-10 MG/5ML syrup, TAKE 1-2 TEASPOONSFUL BY MOUTH EVERY 6 HOURS, Disp: , Rfl: 0 .  triamcinolone cream (KENALOG) 0.1 %, APPLY TO AFFECTED AREA DAILY AS NEEDED AS DIRECTED, Disp: , Rfl: 6 .  warfarin (COUMADIN) 5 MG tablet, Take 2.5 mg by mouth daily at 6 PM. , Disp: , Rfl:   Review of Systems Dyspnea, cough, wheezing, hemoptysis. Denies any sputum production, fevers, chills, malaise, loss of weight, loss of appetite. Denies any nausea, vomiting, diarrhea, constipation. Denies any chest pain, palpitation. All other review of systems are negative.    Objective:   Physical Exam Blood pressure 132/84, pulse 78, height 5\' 5"  (1.651 m), weight 404 lb (183.253 kg), SpO2 94 %. Gen: No apparent distress, obese Neuro: No gross focal deficits. HEENT: No JVD, lymphadenopathy, thyromegaly. RS: Clear, No wheeze or crackles CVS: S1-S2 heard, no murmurs rubs  gallops. Abdomen: Soft, positive bowel sounds. Musculoskeletal: No edema.    Assessment & Plan:  Evaluation for abrnormal CT.  Review of her CT shows cannonball type lesions (new since Aug 2016),  which is highly concerning for metastatic cancer. Given her recent history of endometrial cancer I believe this is the most likely source. She does not have a smoking history so the risk of a primary lung cancer is low. I will evaluate with a PET scan.  She may need a CT-guided biopsy based on the PET scan results. She has intermittent minor hemoptysis which is likely from the lesions in the lung. I discussed a bronchoscope for further evaluation but she is reluctant to undergo this procedure. I will defer this procedure while the other work up is pending.   Mrs Bessire requested that I discuss the CT and further workup with her oncologist.  Plan: - PET scan - CT guided biopsy - Discuss with the oncology team  Marshell Garfinkel MD [facility] Pager [phone] If no answer or after 3pm call: [phone] 12/01/2015, 1:41 PM

## 2015-12-01 NOTE — Telephone Encounter (Signed)
She has xanax on her med list, why can she not try taking that?? LMTCB

## 2015-12-02 ENCOUNTER — Telehealth: Payer: Self-pay | Admitting: Pulmonary Disease

## 2015-12-02 DIAGNOSIS — C799 Secondary malignant neoplasm of unspecified site: Secondary | ICD-10-CM

## 2015-12-02 NOTE — Telephone Encounter (Signed)
Referral was made and her ct bx order was cancelled

## 2015-12-02 NOTE — Telephone Encounter (Signed)
Spoke with patient-States her PCP changed her [name] to Lorazepam (unsure of dose) today and will use that prior to PET scan. Nothing more needed at this time.

## 2015-12-02 NOTE — Addendum Note (Signed)
Addended by: Rosana Berger on: 12/02/2015 02:30 PM   Modules accepted: Orders

## 2015-12-02 NOTE — Telephone Encounter (Signed)
I spoke over the phone with Dr. Isidore Moos. She reccommended a referral to Dr Evlyn Clines. I will hold off on the biopsy till the PET scan is completed and she is seen by Dr. Marko Plume.   Please place the referral and d/c the CT biopsy order.

## 2015-12-05 ENCOUNTER — Telehealth: Payer: Self-pay | Admitting: Gynecologic Oncology

## 2015-12-05 ENCOUNTER — Ambulatory Visit: Payer: Medicare Other | Admitting: Adult Health

## 2015-12-05 NOTE — Telephone Encounter (Signed)
Called and informed the patient that Dr. Fermin Schwab spoke with her pulmonologist about her current situation and we will continue with plan for PET scan and biopsy of one of the pulmonary lesions.  Advised that we would go ahead and set her up to meet with Dr. Marko Plume with [facility] at the end of the month to give enough time to get testing results back.  Patient advised that if the biopsy showed it was not of GYN origin, her appt may be changed with another doctor to better meet her needs.  Advised she would be contacted with her upcoming appts with Dr. Marko Plume.  No concerns voiced.

## 2015-12-06 ENCOUNTER — Emergency Department (HOSPITAL_COMMUNITY)
Admission: EM | Admit: 2015-12-06 | Discharge: 2015-12-06 | Disposition: A | Discharge status: Home / Self Care | Payer: Medicare Other | Attending: Emergency Medicine | Admitting: Emergency Medicine

## 2015-12-06 ENCOUNTER — Emergency Department (HOSPITAL_COMMUNITY): Payer: Medicare Other

## 2015-12-06 ENCOUNTER — Telehealth: Payer: Self-pay | Admitting: Pulmonary Disease

## 2015-12-06 ENCOUNTER — Encounter (HOSPITAL_COMMUNITY): Payer: Self-pay | Admitting: Emergency Medicine

## 2015-12-06 DIAGNOSIS — C801 Malignant (primary) neoplasm, unspecified: Secondary | ICD-10-CM | POA: Diagnosis not present

## 2015-12-06 DIAGNOSIS — Z8542 Personal history of malignant neoplasm of other parts of uterus: Secondary | ICD-10-CM | POA: Insufficient documentation

## 2015-12-06 DIAGNOSIS — Z85118 Personal history of other malignant neoplasm of bronchus and lung: Secondary | ICD-10-CM | POA: Insufficient documentation

## 2015-12-06 DIAGNOSIS — E785 Hyperlipidemia, unspecified: Secondary | ICD-10-CM | POA: Diagnosis not present

## 2015-12-06 DIAGNOSIS — J9501 Hemorrhage from tracheostomy stoma: Secondary | ICD-10-CM | POA: Diagnosis present

## 2015-12-06 DIAGNOSIS — Z7984 Long term (current) use of oral hypoglycemic drugs: Secondary | ICD-10-CM | POA: Diagnosis not present

## 2015-12-06 DIAGNOSIS — E119 Type 2 diabetes mellitus without complications: Secondary | ICD-10-CM | POA: Insufficient documentation

## 2015-12-06 DIAGNOSIS — J9500 Unspecified tracheostomy complication: Secondary | ICD-10-CM

## 2015-12-06 DIAGNOSIS — N189 Chronic kidney disease, unspecified: Secondary | ICD-10-CM | POA: Diagnosis not present

## 2015-12-06 DIAGNOSIS — J45909 Unspecified asthma, uncomplicated: Secondary | ICD-10-CM | POA: Diagnosis not present

## 2015-12-06 DIAGNOSIS — I509 Heart failure, unspecified: Secondary | ICD-10-CM | POA: Insufficient documentation

## 2015-12-06 DIAGNOSIS — Z79899 Other long term (current) drug therapy: Secondary | ICD-10-CM | POA: Diagnosis not present

## 2015-12-06 DIAGNOSIS — C799 Secondary malignant neoplasm of unspecified site: Secondary | ICD-10-CM

## 2015-12-06 LAB — CBC WITH DIFFERENTIAL/PLATELET
BASOS ABS: 0 10*3/uL (ref 0.0–0.1)
BASOS PCT: 0 %
EOS ABS: 0.2 10*3/uL (ref 0.0–0.7)
Eosinophils Relative: 3 %
HCT: 42.7 % (ref 36.0–46.0)
HEMOGLOBIN: 14 g/dL (ref 12.0–15.0)
Lymphocytes Relative: 13 %
Lymphs Abs: 1 10*3/uL (ref 0.7–4.0)
MCH: 30.1 pg (ref 26.0–34.0)
MCHC: 32.8 g/dL (ref 30.0–36.0)
MCV: 91.8 fL (ref 78.0–100.0)
MONOS PCT: 8 %
Monocytes Absolute: 0.7 10*3/uL (ref 0.1–1.0)
NEUTROS ABS: 6.1 10*3/uL (ref 1.7–7.7)
NEUTROS PCT: 76 %
Platelets: 152 10*3/uL (ref 150–400)
RBC: 4.65 MIL/uL (ref 3.87–5.11)
RDW: 15 % (ref 11.5–15.5)
WBC: 8.1 10*3/uL (ref 4.0–10.5)

## 2015-12-06 LAB — BASIC METABOLIC PANEL
Anion gap: 7 (ref 5–15)
BUN: 15 mg/dL (ref 6–20)
CALCIUM: 9 mg/dL (ref 8.9–10.3)
CO2: 25 mmol/L (ref 22–32)
CREATININE: 0.88 mg/dL (ref 0.44–1.00)
Chloride: 107 mmol/L (ref 101–111)
GFR calc Af Amer: >60 mL/min (ref >60)
Glucose, Bld: 189 mg/dL — ABNORMAL HIGH (ref 65–99)
POTASSIUM: 4.2 mmol/L (ref 3.5–5.1)
SODIUM: 139 mmol/L (ref 135–145)

## 2015-12-06 LAB — BRAIN NATRIURETIC PEPTIDE: B Natriuretic Peptide: 20.9 pg/mL (ref 0.0–100.0)

## 2015-12-06 LAB — I-STAT TROPONIN, ED: TROPONIN I, POC: 0 ng/mL (ref 0.00–0.08)

## 2015-12-06 LAB — PROTIME-INR
INR: 2.24 — ABNORMAL HIGH (ref 0.00–1.49)
Prothrombin Time: 23.8 seconds — ABNORMAL HIGH (ref 11.6–15.2)

## 2015-12-06 NOTE — Telephone Encounter (Signed)
Spoke with the pt's caregiver  She states that the pt is coughing and "blood is flying out of her trach"  I advised needs to call EMS STAT  She reports that they are already there and are taking Shakeria to the hospital  I will forward to PM to make him aware

## 2015-12-06 NOTE — Discharge Instructions (Signed)
Ms. SHERRAL KRAMAR,  Nice meeting you! Please follow-up with your pulmonologist. Return to the emergency department if you develop fevers, chills, worsening bleeding, new/worsening symptoms. Feel better soon!  S. Wendie Simmer, PA-C

## 2015-12-06 NOTE — ED Notes (Signed)
pe [facility] pt from home co trach bleeding x 2 months. Pt do self suction yet no relief today. Also reports chest pain yet no shortness of breath. Hx Lung Cancer. Last treatment on January. Hx ovarian Ca. Also reports intermittent nausea. Alert and oriented x 4.

## 2015-12-06 NOTE — ED Notes (Signed)
2 RNs attempted IV with no success, [name] IV start was no success as well. IV team consulted

## 2015-12-06 NOTE — ED Notes (Signed)
RT at bedside.

## 2015-12-06 NOTE — ED Notes (Signed)
[facility] has been called for transport. 

## 2015-12-06 NOTE — ED Notes (Signed)
Iv team unable to establish IV

## 2015-12-06 NOTE — ED Notes (Signed)
Bed: [id number] Expected date:  Expected time:  Means of arrival:  Comments: EMS- 55yo F, trach bleeding x 2 months/Lung CA

## 2015-12-06 NOTE — ED Provider Notes (Signed)
CSN: [id number]     Arrival date & time 12/06/15  1051 History   First MD Initiated Contact with Patient 12/06/15 1103     Chief Complaint  Patient presents with  . trach bleeding    HPI   [name] is a 55 y.o. 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 She states the site has been using blood continuously throughout the day and has been producing clots approximately 1 cm in diameter. She was evaluated by an ENT provider who felt that the bleeding was most likely the result of her lung nodules and not at the tracheostomy site. She also endorses midsternal chest pain that she describes as nonradiating, constant, worsened with cough and associated with shortness of breath, 5/10 pain scale. She endorses subjective fevers and nausea. She denies vomiting, abdominal pain, change in bowel or bladder habits.  Past Medical History  Diagnosis Date  . Morbid obesity ([place])   . Depressive disorder, not elsewhere classified   . Asthma   . Dyslipidemia   . Clostridium difficile enterocolitis 2012  . Splenomegaly 06/01/2011  . Thrombocytopenia ([place]) 06/01/2011  . Hepatosplenomegaly   . Gout   . GERD (gastroesophageal reflux disease)   . Osteoarthritis   . Polyneuropathy ([place])   . Urinary incontinence   . Amenorrhea   . CHF (congestive heart failure) ([place])   . Hyperlipidemia   . Leg swelling   . Nausea   . Weakness   . Palpitations   . Anginal pain ([place])     no chest pain in years  . Dysrhythmia     heart palpations  . Anxiety   . Pneumonia   . Shortness of breath   . Chronic kidney disease     overactive bladder  . Peripheral neuropathy ([place])   . DVT (deep venous thrombosis) ([place])   . OSA (obstructive sleep apnea)     uses ventilator ( has trach)  . Diabetes mellitus     type 2  .  Irritable bowel syndrome   . PONV (postoperative nausea and vomiting)     just nausea  . Endometrial cancer ([place]) dx'd 01/19/15   Past Surgical History  Procedure Laterality Date  . Cholecystectomy    . Tracheostomy  2001  . Endometrial ablation    . Skin graft    . Finger surgery      ring finger on right hand  . Esophageal dilation  2003  . Breast biopsy      needle core, left  . Dilation and curettage of uterus      times 2  . Tracheal dilitation  01/24/2012    Procedure: TRACHEAL DILITATION;  Surgeon: Melissa Montane, MD;  Location: [facility];  Service: ENT;  Laterality: N/A;  Trache change with possible dilation, from size 6 to size 7 uncuffed  . Amputation Right 11/19/2013    Procedure: AMPUTATION DIGIT;  Surgeon: Cammie Sickle, MD;  Location: [facility];  Service: Orthopedics;  Laterality: Right;  Partial amputation right long finger, Debridement right ring finger   . Amputation Right 06/03/2014    Procedure: REVISION AMPUTATION RIGHT RING FINGER;  Surgeon: Daryll Brod, MD;  Location: [facility];  Service: Orthopedics;  Laterality: Right;  . Dilatation & currettage/hysteroscopy with resectocope N/A 01/17/2015  Procedure: DILATATION & CURETTAGE/HYSTEROSCOPY WITH RESECTOCOPE; Pap Smear;  Surgeon: Ena Dawley, MD;  Location: [facility];  Service: Gynecology;  Laterality: N/A;  . Intrauterine device (iud) insertion N/A 01/17/2015    Procedure: INTRAUTERINE DEVICE (IUD) INSERTION;  Surgeon: Ena Dawley, MD;  Location: [facility];  Service: Gynecology;  Laterality: N/A;  . Finger surgery Bilateral 06/16/15    I & D of multiple fingers  . Incision and drainage Left 06/16/2015    Procedure: IRRIGATION AND DEBRIDEMENT OF LEFT RING FINGER AND LEFT SMALL FINGER.;  Surgeon: Daryll Brod, MD;  Location: [facility];  Service: Orthopedics;  Laterality: Left;  . Dressing change under anesthesia Right 06/16/2015    Procedure: DRESSING CHANGE UNDER ANESTHESIA TO RIGHT THUMB AND  RIGHT FIRST FINGER.;  Surgeon: Daryll Brod, MD;  Location: [facility];  Service: Orthopedics;  Laterality: Right;   Family History  Problem Relation Age of Onset  . Diabetes Father   . Hypertension Father   . Dementia Father     Deceased, 52  . Pneumonia Father   . Heart disease Mother   . Clotting disorder Mother   . Hypertension Mother   . Heart attack Mother     Deceased, 84  . Multiple myeloma Paternal Grandmother   . Cancer Paternal Grandmother     multiple myoloma  . Heart disease Paternal Grandfather   . Kidney disease Maternal Grandmother   . Hypertension Sister   . Cancer Paternal Aunt     breast  . Cancer Cousin     breast - all three   Social History  Substance Use Topics  . Smoking status: Never Smoker   . Smokeless tobacco: Never Used  . Alcohol Use: No     Comment: occasional - once or twice a year   OB History    Gravida Para Term Preterm AB TAB SAB Ectopic Multiple Living   0         0     Review of Systems  Ten systems are reviewed and are negative for acute change except as noted in the HPI  Allergies  Codeine; Molds & smuts; Allopurinol; Ciprofloxacin; Doxycycline; Fexofenadine; Keflex; Nitroglycerin er; Oritavancin; Sulfa drugs cross reactors; Accolate; Amoxicillin-pot clavulanate; Morphine; Nystatin; and Septra  Home Medications   Prior to Admission medications   Medication Sig Start Date End Date Taking? Authorizing Provider  acetaminophen (TYLENOL) 500 MG tablet Take 1,000 mg by mouth every 6 (six) hours as needed for mild pain. For pain    Historical Provider, MD  albuterol (PROVENTIL) (2.5 MG/3ML) 0.083% nebulizer solution Take 2.5 mg by nebulization every 4 (four) hours as needed. For shortness of breath 05/28/11   Chesley Mires, MD  ALPRAZolam Duanne Moron) 0.5 MG tablet Take 0.5 mg by mouth 2 (two) times daily as needed. For anxiety    Historical Provider, MD  atorvastatin (LIPITOR) 10 MG tablet Take 10 mg by mouth at bedtime.     Historical Provider,  MD  budesonide (PULMICORT) 0.25 MG/2ML nebulizer solution Take 0.25 mg by nebulization 2 (two) times daily as needed (shortness of breath).    Historical Provider, MD  bumetanide (BUMEX) 2 MG tablet TAKE 1 TABLET (2 MG TOTAL) BY MOUTH DAILY. 09/14/15   Jettie Booze, MD  busPIRone (BUSPAR) 5 MG tablet Take 7.5 mg by mouth 2 (two) times daily.     Historical Provider, MD  BYSTOLIC 10 MG tablet TAKE 1 TABLET (10 MG TOTAL) BY MOUTH DAILY. 09/21/15   Jettie Booze, MD  cholestyramine (QUESTRAN) 4 G packet Take 1 packet by mouth as needed. For loose stools, IBS.    Historical Provider, MD  clotrimazole (LOTRIMIN) 1 % cream Apply 1 application topically daily. To stomach    Historical Provider, MD  colchicine 0.6 MG tablet Take 0.6 mg by mouth daily.    Historical Provider, MD  diphenoxylate-atropine (LOMOTIL) 2.5-0.025 MG tablet Take 1-2 tablets by mouth 4 (four) times daily as needed for diarrhea or loose stools. 07/18/15   Eppie Gibson, MD  DULoxetine (CYMBALTA) 30 MG capsule Take 1 capsule (30 mg total) by mouth 2 (two) times daily. 09/16/15   Donika Keith Rake, DO  ergocalciferol (VITAMIN D2) 50000 UNITS capsule Take 50,000 Units by mouth once a week. Take on Fridays    Historical Provider, MD  Febuxostat (ULORIC) 80 MG TABS Take 80 mg by mouth at bedtime.     Historical Provider, MD  fesoterodine (TOVIAZ) 4 MG TB24 Take 4 mg by mouth daily.      Historical Provider, MD  gabapentin (NEURONTIN) 300 MG capsule Take 1 capsule (300 mg total) by mouth 2 (two) times daily. 09/16/15   Donika K Patel, DO  guaiFENesin (MUCINEX) 600 MG 12 hr tablet Take 600 mg by mouth 2 (two) times daily.     Historical Provider, MD  HUMULIN R U-500 KWIKPEN 500 UNIT/ML kwikpen Inject 110 Units into the skin daily with breakfast. Inject 110 units in am, 60 units with lunch and 60 units with supper Patient taking differently: Inject 110 Units into the skin daily with breakfast. Inject 120 units in am, 80 units with lunch and  80 units with supper 08/19/15   Nishant Dhungel, MD  HYDROcodone-acetaminophen (NORCO) 5-325 MG tablet Take 1 tablet by mouth every 6 (six) hours as needed for moderate pain. 06/16/15   Daryll Brod, MD  ipratropium (ATROVENT) 0.02 % nebulizer solution Take 0.5 mg by nebulization 4 (four) times daily as needed for wheezing or shortness of breath.    Historical Provider, MD  ketoconazole (NIZORAL) 2 % cream Apply 1 application topically daily. 04/13/15   Donnel Saxon, CNM  LORazepam (ATIVAN) 0.5 MG tablet Take 1 tablet by mouth 30 minutes before radiotherapy, PRN nausea 07/11/15   Eppie Gibson, MD  losartan (COZAAR) 100 MG tablet TAKE 1 TABLET (100MG) BY MOUTH DAILY. 09/20/15   Jettie Booze, MD  meclizine (ANTIVERT) 12.5 MG tablet Take 12.5 mg by mouth at bedtime.  12/05/14   Historical Provider, MD  megestrol (MEGACE) 40 MG tablet Take 2 tablets (80 mg total) by mouth 3 (three) times daily. 09/21/15   Melissa D Cross, NP  metolazone (ZAROXOLYN) 5 MG tablet TAKE 1 TABLET (5 MG TOTAL) BY MOUTH SEE ADMIN INSTRUCTIONS. 3 TIMES WEEKLY AS NEEDED FOR EDEMA 09/13/14   Jettie Booze, MD  Multiple Vitamin (MULTIVITAMIN) capsule Take 1 capsule by mouth daily.      Historical Provider, MD  mupirocin ointment (BACTROBAN) 2 % APPLY 1 APPLICATION TO AFFECTED AREA 3 TIMES DAILY AS NEEDED FOR INFECTION 03/09/15   Historical Provider, MD  Omega-3 Fatty Acids (OMEGA-3 FISH OIL) 1200 MG CAPS Take 2 capsules (2,400 mg total) by mouth 2 (two) times daily. 09/07/14   Jettie Booze, MD  omeprazole (PRILOSEC) 40 MG capsule Take 40 mg by mouth 2 (two) times daily.     Historical Provider, MD  phenazopyridine (PYRIDIUM) 95 MG tablet Take 1 tablet (95 mg total) by mouth 3 (three) times daily as needed for pain. 08/05/15  Dorothyann Gibbs, NP  potassium chloride SA (K-DUR,KLOR-CON) 20 MEQ tablet Take 20 mEq by mouth 4 (four) times daily. 1 in AM, 2 at lunch, 2 at supper and 1 at bedtime    Historical Provider, MD   primidone (MYSOLINE) 50 MG tablet Take 1 tablet (50 mg total) by mouth 2 (two) times daily. 09/16/15   Alda Berthold, DO  Probiotic Product (PROBIOTIC PO) Take 1 tablet by mouth daily.     Historical Provider, MD  promethazine (PHENERGAN) 25 MG tablet Take 1 tablet (25 mg total) by mouth every 4 (four) hours as needed for nausea or vomiting. 07/13/15   Campbell Riches, MD  promethazine-codeine [facility] WITH CODEINE) 6.25-10 MG/5ML syrup TAKE 1-2 TEASPOONSFUL BY MOUTH EVERY 6 HOURS 03/24/15   Historical Provider, MD  triamcinolone cream (KENALOG) 0.1 % APPLY TO AFFECTED AREA DAILY AS NEEDED AS DIRECTED 06/14/15   Historical Provider, MD  warfarin (COUMADIN) 5 MG tablet Take 2.5 mg by mouth daily at 6 PM.     Historical Provider, MD   BP 171/80 mmHg  Pulse 81  Temp(Src) 98.1 F (36.7 C) (Oral)  Resp 20  Wt 195.954 kg  SpO2 93% Physical Exam  Constitutional: She appears well-developed and well-nourished. No distress.  Morbidly obese  HENT:  Head: Normocephalic and atraumatic.  Mouth/Throat: Oropharynx is clear and moist. No oropharyngeal exudate.  Trach site with dried blood  Eyes: Conjunctivae are normal. Pupils are equal, round, and reactive to light. Right eye exhibits no discharge. Left eye exhibits no discharge. No scleral icterus.  Neck: No tracheal deviation present.  Cardiovascular: Normal rate, regular rhythm, normal heart sounds and intact distal pulses.  Exam reveals no gallop and no friction rub.   No murmur heard. Pulmonary/Chest: Effort normal. No respiratory distress. She has wheezes. She has no rales. She exhibits no tenderness.  End expiratory wheezes bilaterally at lower lobes. Rhonchi bilaterally  Abdominal: Soft. Bowel sounds are normal. She exhibits no distension and no mass. There is no tenderness. There is no rebound and no guarding.  Musculoskeletal: She exhibits no edema.  Lymphadenopathy:    She has no cervical adenopathy.  Neurological: She is alert.  Coordination normal.  Skin: Skin is warm and dry. No rash noted. She is not diaphoretic. No erythema.  Psychiatric: She has a normal mood and affect. Her behavior is normal.  Nursing note and vitals reviewed.   ED Course  Procedures  Labs Review Labs Reviewed  BASIC METABOLIC PANEL - Abnormal; Notable for the following:    Glucose, Bld 189 (*)    All other components within normal limits  PROTIME-INR - Abnormal; Notable for the following:    Prothrombin Time 23.8 (*)    INR 2.24 (*)    All other components within normal limits  BRAIN NATRIURETIC PEPTIDE  CBC WITH DIFFERENTIAL/PLATELET  CBC WITH DIFFERENTIAL/PLATELET  Randolm Idol, ED    Imaging Review Dg Chest 2 View  12/06/2015  CLINICAL DATA:  Lung and ovarian carcinoma. Two-month history of tracheostomy region bleeding EXAM: CHEST  2 VIEW COMPARISON:  Chest radiograph Nov 23, 2015; chest CT November 29, 2015 FINDINGS: Tracheostomy catheter tip is 4.7 cm above the carina. No pneumothorax. There are innumerable pulmonary metastatic lesions ranging in size from approximately 5 mm to as large as 1.5 cm. There is no airspace consolidation. Heart is mildly enlarged with pulmonary vascularity within normal limits. No adenopathy is evident. No bone lesions are evident. IMPRESSION: Widespread pulmonary metastatic nodular lesions throughout the  lungs bilaterally. No airspace consolidation. No pneumothorax. Stable cardiac silhouette. Electronically Signed   By: Lowella Grip III [name]   On: 12/06/2015 12:44   I have personally reviewed and evaluated these images and lab results as part of my medical decision-making.   EKG Interpretation   Date/Time:  Tuesday December 06 2015 11:11:27 EDT Ventricular Rate:  80 PR Interval:  175 QRS Duration: 120 QT Interval:  403 QTC Calculation: 465 R Axis:   47 Text Interpretation:  Sinus rhythm Nonspecific intraventricular conduction  delay since last tracing no significant change Confirmed by Eulis Foster   MD,  ELLIOTT ([id number]) on 12/06/2015 2:17:45 PM      MDM   Final diagnoses:  Tracheostomy complication, unspecified complication type ([place])  Metastatic cancer ([place])   No evidence of acute bleed while in ED.  Labs at baseline. CXR, EKG without acute change.  Discussed case with pulmonology who advised contacting ENT given Dr. Janace Hoard, as he was the one that placed the trach. After speaking with Dr. Erik Obey, who reviewed the last visit, as well as faxed over the last visit documentation, he advised follow-up with pulmonology, as Dr. Janace Hoard performed a transnasal bronchoscopy.  Dr. Zenia Resides evaluated patient as well and agrees with above plan.  Patient may be safely discharged home. Discussed reasons for return. Patient to follow-up with pulmnology and oncology. Patient in understanding and agreement with the plan.  [place], PA-C 12/06/15 1657  Lacretia Leigh, MD 12/07/15 [phone]

## 2015-12-07 ENCOUNTER — Telehealth: Payer: Self-pay | Admitting: Pulmonary Disease

## 2015-12-07 NOTE — Telephone Encounter (Signed)
Spoke with Nira Conn at [facility], wants to know why pt's PET was scheduled so far out and wants to see if this can be moved up.  Per Nira Conn, pt states that pt's overall health is quickly deteriorating- pt went to ED yesterday for blood in trach, but was not admitted.    PCC's please advise on why this PET was scheduled so far out, and if this can be moved up.  Thanks!

## 2015-12-07 NOTE — Telephone Encounter (Signed)
At the time I phoned [facility] to schedule pt's PET this was first available appt they had (normally is a 10-14 day turnaround to get PET scans scheduled).  I provided the pt with phone number to [facility] [phone]) to call for any questions.  They can call that same number to see if there are any cancellations for a sooner appt.  [facility] does not keep a cancellation list so patients just have to call and check to see if they have any cancellations.

## 2015-12-07 NOTE — Telephone Encounter (Signed)
Spoke with [name] and advised as to scheduling of PET. She voiced understanding and was fine with it. Nothing further needed.

## 2015-12-08 ENCOUNTER — Telehealth: Payer: Self-pay

## 2015-12-08 NOTE — Telephone Encounter (Signed)
Letter of approval for Bystolic 10mg  received from [facility]. Approval is good through May 23,2018.

## 2015-12-09 ENCOUNTER — Ambulatory Visit (HOSPITAL_COMMUNITY)
Admission: RE | Admit: 2015-12-09 | Discharge: 2015-12-09 | Disposition: A | Discharge status: Home / Self Care | Payer: Medicare Other | Source: Ambulatory Visit | Attending: Pulmonary Disease | Admitting: Pulmonary Disease

## 2015-12-09 ENCOUNTER — Telehealth: Payer: Self-pay | Admitting: Pulmonary Disease

## 2015-12-09 ENCOUNTER — Other Ambulatory Visit: Payer: Self-pay | Admitting: Pulmonary Disease

## 2015-12-09 DIAGNOSIS — R911 Solitary pulmonary nodule: Secondary | ICD-10-CM | POA: Insufficient documentation

## 2015-12-09 DIAGNOSIS — R918 Other nonspecific abnormal finding of lung field: Secondary | ICD-10-CM

## 2015-12-09 LAB — GLUCOSE, CAPILLARY: Glucose-Capillary: 237 mg/dL — ABNORMAL HIGH (ref 65–99)

## 2015-12-09 MED ORDER — FLUDEOXYGLUCOSE F - 18 (FDG) INJECTION
15.9200 | Freq: Once | INTRAVENOUS | Status: AC | PRN
  Administered 2015-12-09: 15.92 via INTRAVENOUS

## 2015-12-09 NOTE — Telephone Encounter (Signed)
Please order and schedule for CT guided biopsy of the lung nodules.

## 2015-12-09 NOTE — Telephone Encounter (Signed)
Called and spoke with [facility] at PET scan.  He said that he tried several times to get patient to lay back to do PET scan, but she couldn't do it.  Every time she would lay down, she could not breath, so they were unable to complete the PET scan.  FYI to Dr. Vaughan Browner.

## 2015-12-09 NOTE — Addendum Note (Signed)
Addended by: Mathis Dad on: 12/09/2015 11:19 AM   Modules accepted: Orders

## 2015-12-09 NOTE — Telephone Encounter (Signed)
Patient aware of Dr. Matilde Bash recommendations. Ordered CT Guided Biopsy. Nothing further needed.

## 2015-12-13 ENCOUNTER — Other Ambulatory Visit: Payer: Self-pay | Admitting: Gynecologic Oncology

## 2015-12-13 ENCOUNTER — Other Ambulatory Visit: Payer: Self-pay | Admitting: Interventional Cardiology

## 2015-12-13 ENCOUNTER — Other Ambulatory Visit: Payer: Self-pay

## 2015-12-13 MED ORDER — LOSARTAN POTASSIUM 100 MG PO TABS: ORAL_TABLET | ORAL | Status: DC

## 2015-12-15 ENCOUNTER — Encounter: Payer: Self-pay | Admitting: Interventional Cardiology

## 2015-12-15 ENCOUNTER — Telehealth: Payer: Self-pay | Admitting: *Deleted

## 2015-12-15 ENCOUNTER — Ambulatory Visit (HOSPITAL_COMMUNITY): Payer: Medicare Other

## 2015-12-15 NOTE — Telephone Encounter (Signed)
-----   Message from Marshell Garfinkel, MD sent at 12/12/2015  8:55 PM EDT ----- Regarding: RE: Ct biopsy Hi Minah Axelrod,  Can you please coordinate with the patient and stop coumadin for 5 days before CT guided biopsy of lung nodules.  Thanks Praveen   ----- Message -----    From: Alecia Lemming    Sent: 12/12/2015   9:35 AM      To: April H Pait, Marshell Garfinkel, MD Subject: Ct biopsy                                      Pt is on Coundamin need to hold 5 days prior to biopsy. Can someone call patient have her stop blood thinners prior to biopsy,please let me know so we can schedule biopsy.Thanks Elmo Putt

## 2015-12-15 NOTE — Telephone Encounter (Signed)
CT Biopsy is scheduled for 6.28.17 Pt stated that she has already spoken with the hospital and they have informed her to stop her Coumadin on 6.24.17  Pt is asking if she would be able to take Lorazepam 0.5mg  prior to the procedure.  She is aware PM is not in the office but will return next week.  Please advise, thank you.

## 2015-12-19 NOTE — Telephone Encounter (Signed)
Yes. Ok to take lorazepam 0.5 mg prior to biopsy

## 2015-12-19 NOTE — Telephone Encounter (Signed)
Spoke with pt. She is aware that she can take Lorazepam. Nothing further was needed.

## 2015-12-20 ENCOUNTER — Other Ambulatory Visit: Payer: Self-pay | Admitting: Radiology

## 2015-12-20 ENCOUNTER — Other Ambulatory Visit: Payer: Self-pay

## 2015-12-20 MED ORDER — LOSARTAN POTASSIUM 100 MG PO TABS: ORAL_TABLET | ORAL | Status: DC

## 2015-12-21 ENCOUNTER — Ambulatory Visit (HOSPITAL_COMMUNITY)
Admission: RE | Admit: 2015-12-21 | Discharge: 2015-12-21 | Disposition: A | Discharge status: Home / Self Care | Payer: Medicare Other | Source: Ambulatory Visit | Attending: Pulmonary Disease | Admitting: Pulmonary Disease

## 2015-12-21 ENCOUNTER — Ambulatory Visit (HOSPITAL_COMMUNITY)
Admission: RE | Admit: 2015-12-21 | Discharge: 2015-12-21 | Disposition: A | Discharge status: Home / Self Care | Payer: Medicare Other | Source: Ambulatory Visit | Attending: Interventional Radiology | Admitting: Interventional Radiology

## 2015-12-21 ENCOUNTER — Encounter (HOSPITAL_COMMUNITY): Payer: Self-pay

## 2015-12-21 DIAGNOSIS — Z79899 Other long term (current) drug therapy: Secondary | ICD-10-CM | POA: Insufficient documentation

## 2015-12-21 DIAGNOSIS — Z8542 Personal history of malignant neoplasm of other parts of uterus: Secondary | ICD-10-CM | POA: Insufficient documentation

## 2015-12-21 DIAGNOSIS — C7801 Secondary malignant neoplasm of right lung: Secondary | ICD-10-CM | POA: Insufficient documentation

## 2015-12-21 DIAGNOSIS — F329 Major depressive disorder, single episode, unspecified: Secondary | ICD-10-CM | POA: Diagnosis not present

## 2015-12-21 DIAGNOSIS — Z86718 Personal history of other venous thrombosis and embolism: Secondary | ICD-10-CM | POA: Diagnosis not present

## 2015-12-21 DIAGNOSIS — Z9889 Other specified postprocedural states: Secondary | ICD-10-CM | POA: Insufficient documentation

## 2015-12-21 DIAGNOSIS — F419 Anxiety disorder, unspecified: Secondary | ICD-10-CM | POA: Insufficient documentation

## 2015-12-21 DIAGNOSIS — J45909 Unspecified asthma, uncomplicated: Secondary | ICD-10-CM | POA: Insufficient documentation

## 2015-12-21 DIAGNOSIS — E785 Hyperlipidemia, unspecified: Secondary | ICD-10-CM | POA: Insufficient documentation

## 2015-12-21 DIAGNOSIS — Z7901 Long term (current) use of anticoagulants: Secondary | ICD-10-CM | POA: Insufficient documentation

## 2015-12-21 DIAGNOSIS — N189 Chronic kidney disease, unspecified: Secondary | ICD-10-CM | POA: Insufficient documentation

## 2015-12-21 DIAGNOSIS — G629 Polyneuropathy, unspecified: Secondary | ICD-10-CM | POA: Insufficient documentation

## 2015-12-21 DIAGNOSIS — Z923 Personal history of irradiation: Secondary | ICD-10-CM | POA: Diagnosis not present

## 2015-12-21 DIAGNOSIS — G4733 Obstructive sleep apnea (adult) (pediatric): Secondary | ICD-10-CM | POA: Insufficient documentation

## 2015-12-21 DIAGNOSIS — E1122 Type 2 diabetes mellitus with diabetic chronic kidney disease: Secondary | ICD-10-CM | POA: Diagnosis not present

## 2015-12-21 DIAGNOSIS — R918 Other nonspecific abnormal finding of lung field: Secondary | ICD-10-CM | POA: Diagnosis present

## 2015-12-21 DIAGNOSIS — K219 Gastro-esophageal reflux disease without esophagitis: Secondary | ICD-10-CM | POA: Insufficient documentation

## 2015-12-21 DIAGNOSIS — D696 Thrombocytopenia, unspecified: Secondary | ICD-10-CM | POA: Insufficient documentation

## 2015-12-21 DIAGNOSIS — Z930 Tracheostomy status: Secondary | ICD-10-CM | POA: Diagnosis not present

## 2015-12-21 DIAGNOSIS — I509 Heart failure, unspecified: Secondary | ICD-10-CM | POA: Insufficient documentation

## 2015-12-21 LAB — CBC
HEMATOCRIT: 36.9 % (ref 36.0–46.0)
Hemoglobin: 11.7 g/dL — ABNORMAL LOW (ref 12.0–15.0)
MCH: 28.4 pg (ref 26.0–34.0)
MCHC: 31.7 g/dL (ref 30.0–36.0)
MCV: 89.6 fL (ref 78.0–100.0)
PLATELETS: 157 10*3/uL (ref 150–400)
RBC: 4.12 MIL/uL (ref 3.87–5.11)
RDW: 15.3 % (ref 11.5–15.5)
WBC: 9 10*3/uL (ref 4.0–10.5)

## 2015-12-21 LAB — PROTIME-INR
INR: 1.43 (ref 0.00–1.49)
Prothrombin Time: 17.5 seconds — ABNORMAL HIGH (ref 11.6–15.2)

## 2015-12-21 LAB — APTT: aPTT: 30 seconds (ref 24–37)

## 2015-12-21 LAB — GLUCOSE, CAPILLARY: GLUCOSE-CAPILLARY: 244 mg/dL — AB (ref 65–99)

## 2015-12-21 MED ORDER — SODIUM CHLORIDE 0.9 % IV SOLN: Freq: Once | INTRAVENOUS | Status: DC

## 2015-12-21 MED ORDER — SODIUM CHLORIDE 0.9 % IV SOLN
8.0000 mg | Freq: Once | INTRAVENOUS | Status: AC
  Administered 2015-12-21: 8 mg via INTRAVENOUS
  Filled 2015-12-21: qty 4

## 2015-12-21 MED ORDER — FENTANYL CITRATE (PF) 100 MCG/2ML IJ SOLN
INTRAMUSCULAR | Status: AC | PRN
  Administered 2015-12-21: 50 ug via INTRAVENOUS

## 2015-12-21 MED ORDER — FENTANYL CITRATE (PF) 100 MCG/2ML IJ SOLN
INTRAMUSCULAR | Status: AC
  Filled 2015-12-21: qty 2

## 2015-12-21 MED ORDER — MIDAZOLAM HCL 2 MG/2ML IJ SOLN
INTRAMUSCULAR | Status: AC
  Filled 2015-12-21: qty 2

## 2015-12-21 MED ORDER — ONDANSETRON HCL 4 MG/2ML IJ SOLN
INTRAMUSCULAR | Status: AC
  Filled 2015-12-21: qty 2

## 2015-12-21 MED ORDER — MIDAZOLAM HCL 2 MG/2ML IJ SOLN
INTRAMUSCULAR | Status: AC | PRN
  Administered 2015-12-21 (×2): 1 mg via INTRAVENOUS

## 2015-12-21 MED ORDER — LIDOCAINE HCL 1 % IJ SOLN
INTRAMUSCULAR | Status: AC
  Administered 2015-12-21: 20 mL
  Filled 2015-12-21: qty 20

## 2015-12-21 NOTE — Sedation Documentation (Signed)
Patient is resting comfortably. 

## 2015-12-21 NOTE — Sedation Documentation (Addendum)
MD orders Lidocaine to be put into trache to aid in pt not coughing for while- post lung bx - 40ml

## 2015-12-21 NOTE — Sedation Documentation (Addendum)
PA and MD have evaluated pt and agree to proceed with bx. Pt has portable vent and home Resp. Therapist at bedside. Full ventilation (trache collar)

## 2015-12-21 NOTE — H&P (Signed)
Chief Complaint: Bilateral pulmonary nodules History of endometrial cancer  Referring Physician(s): N[place]  Supervising Physician: Jacqulynn Cadet  Patient Status: Outpatient  History of Present Illness: [name] is a 55 y.o. female with morbid obesity, OSA s/p trach.   She was diagnosed with endometrial cancer in July 2016.   She has been treated with radiation therapy ending January 2017.   She's noticed blood from her tracheostomy site over the past couple of months.  She was evaluated by ENT Dr. Janace Hoard who felt that the tracheostomy is not the source of the bleeding.   She had a chest x-ray which showed nodular opacities and a follow-up CT scan which showed multiple rounded opacities highly concerning for metastatic cancer to the lung.  She has history of severe OSA. She's had a tracheostomy placed 16 years ago and is on chronic vent at night. She has a nurse with her who helps manage her vent.  We are asked to perform a CT guided lung biopsy.  She feels ok today.  She denies fever/chills.  She takes warfarin for history of DVT and she has held this x 5 days.  Past Medical History  Diagnosis Date  . Morbid obesity ([place])   . Depressive disorder, not elsewhere classified   . Asthma   . Dyslipidemia   . Clostridium difficile enterocolitis 2012  . Splenomegaly 06/01/2011  . Thrombocytopenia ([place]) 06/01/2011  . Hepatosplenomegaly   . Gout   . GERD (gastroesophageal reflux disease)   . Osteoarthritis   . Polyneuropathy ([place])   . Urinary incontinence   . Amenorrhea   . CHF (congestive heart failure) ([place])   . Hyperlipidemia   . Leg swelling   . Nausea   . Weakness   . Palpitations   . Anginal pain ([place])     no chest pain in years  . Dysrhythmia     heart palpations  . Anxiety   . Pneumonia   . Shortness of breath   . Chronic kidney disease     overactive bladder  . Peripheral neuropathy ([place])   . DVT (deep venous thrombosis) ([place])   .  OSA (obstructive sleep apnea)     uses ventilator ( has trach)  . Diabetes mellitus     type 2  . Irritable bowel syndrome   . PONV (postoperative nausea and vomiting)     just nausea  . Endometrial cancer ([place]) dx'd 01/19/15    Past Surgical History  Procedure Laterality Date  . Cholecystectomy    . Tracheostomy  2001  . Endometrial ablation    . Skin graft    . Finger surgery      ring finger on right hand  . Esophageal dilation  2003  . Breast biopsy      needle core, left  . Dilation and curettage of uterus      times 2  . Tracheal dilitation  01/24/2012    Procedure: TRACHEAL DILITATION;  Surgeon: Melissa Montane, MD;  Location: [place];  Service: ENT;  Laterality: N/A;  Trache change with possible dilation, from size 6 to size 7 uncuffed  . Amputation Right 11/19/2013    Procedure: AMPUTATION DIGIT;  Surgeon: Cammie Sickle, MD;  Location: [facility];  Service: Orthopedics;  Laterality: Right;  Partial amputation right long finger, Debridement right ring finger   . Amputation Right 06/03/2014    Procedure: REVISION AMPUTATION RIGHT RING FINGER;  Surgeon: Daryll Brod, MD;  Location: [facility];  Service: Orthopedics;  Laterality: Right;  . Dilatation & currettage/hysteroscopy with resectocope N/A 01/17/2015    Procedure: DILATATION & CURETTAGE/HYSTEROSCOPY WITH RESECTOCOPE; Pap Smear;  Surgeon: Ena Dawley, MD;  Location: [facility];  Service: Gynecology;  Laterality: N/A;  . Intrauterine device (iud) insertion N/A 01/17/2015    Procedure: INTRAUTERINE DEVICE (IUD) INSERTION;  Surgeon: Ena Dawley, MD;  Location: [facility];  Service: Gynecology;  Laterality: N/A;  . Finger surgery Bilateral 06/16/15    I & D of multiple fingers  . Incision and drainage Left 06/16/2015    Procedure: IRRIGATION AND DEBRIDEMENT OF LEFT RING FINGER AND LEFT SMALL FINGER.;  Surgeon: Daryll Brod, MD;  Location: [place] [facility];  Service: Orthopedics;  Laterality: Left;  . Dressing  change under anesthesia Right 06/16/2015    Procedure: DRESSING CHANGE UNDER ANESTHESIA TO RIGHT THUMB AND RIGHT FIRST FINGER.;  Surgeon: Daryll Brod, MD;  Location: [facility];  Service: Orthopedics;  Laterality: Right;    Allergies: Codeine; Molds & smuts; Allopurinol; Ciprofloxacin; Doxycycline; Fexofenadine; Keflex; Nitroglycerin er; Oritavancin; Sulfa drugs cross reactors; Accolate; Amoxicillin-pot clavulanate; Morphine; Nystatin; and Septra  Medications: Prior to Admission medications   Medication Sig Start Date End Date Taking? Authorizing Provider  acetaminophen (TYLENOL) 500 MG tablet Take 1,000 mg by mouth every 6 (six) hours as needed for mild pain. For pain   Yes Historical Provider, MD  albuterol (PROVENTIL) (2.5 MG/3ML) 0.083% nebulizer solution Take 2.5 mg by nebulization every 4 (four) hours as needed. For shortness of breath 05/28/11  Yes Chesley Mires, MD  atorvastatin (LIPITOR) 10 MG tablet Take 10 mg by mouth at bedtime.    Yes Historical Provider, MD  budesonide (PULMICORT) 0.25 MG/2ML nebulizer solution Take 0.25 mg by nebulization 2 (two) times daily as needed (shortness of breath).   Yes Historical Provider, MD  bumetanide (BUMEX) 2 MG tablet TAKE 1 TABLET (2 MG TOTAL) BY MOUTH DAILY. 09/14/15  Yes Jettie Booze, MD  busPIRone (BUSPAR) 5 MG tablet Take 7.5 mg by mouth 2 (two) times daily.    Yes Historical Provider, MD  BYSTOLIC 10 MG tablet TAKE 1 TABLET (10 MG TOTAL) BY MOUTH DAILY. 09/21/15  Yes Jettie Booze, MD  cholestyramine Lucrezia Starch) 4 G packet Take 1 packet by mouth as needed. For loose stools, IBS.   Yes Historical Provider, MD  clotrimazole (LOTRIMIN) 1 % cream Apply 1 application topically daily. To stomach   Yes Historical Provider, MD  colchicine 0.6 MG tablet Take 0.6 mg by mouth daily.   Yes Historical Provider, MD  diphenoxylate-atropine (LOMOTIL) 2.5-0.025 MG tablet Take 1-2 tablets by mouth 4 (four) times daily as needed for diarrhea or loose stools.  07/18/15  Yes Eppie Gibson, MD  DULoxetine (CYMBALTA) 30 MG capsule Take 1 capsule (30 mg total) by mouth 2 (two) times daily. 09/16/15  Yes Donika K Patel, DO  ergocalciferol (VITAMIN D2) 50000 UNITS capsule Take 50,000 Units by mouth once a week. Take on Fridays   Yes Historical Provider, MD  Febuxostat (ULORIC) 80 MG TABS Take 80 mg by mouth at bedtime.    Yes Historical Provider, MD  fesoterodine (TOVIAZ) 4 MG TB24 Take 4 mg by mouth daily.     Yes Historical Provider, MD  gabapentin (NEURONTIN) 300 MG capsule Take 1 capsule (300 mg total) by mouth 2 (two) times daily. 09/16/15  Yes Donika K Patel, DO  guaiFENesin (MUCINEX) 600 MG 12 hr tablet Take 600 mg by mouth 2 (two) times daily.  Yes Historical Provider, MD  HUMULIN R U-500 KWIKPEN 500 UNIT/ML kwikpen Inject 110 Units into the skin daily with breakfast. Inject 110 units in am, 60 units with lunch and 60 units with supper Patient taking differently: Inject 110 Units into the skin daily with breakfast. Inject 120 units in am, 80 units with lunch and 80 units with supper 08/19/15  Yes Nishant Dhungel, MD  HYDROcodone-acetaminophen (NORCO) 5-325 MG tablet Take 1 tablet by mouth every 6 (six) hours as needed for moderate pain. 06/16/15  Yes Daryll Brod, MD  ipratropium (ATROVENT) 0.02 % nebulizer solution Take 0.5 mg by nebulization 4 (four) times daily as needed for wheezing or shortness of breath.   Yes Historical Provider, MD  ketoconazole (NIZORAL) 2 % cream Apply 1 application topically daily. 04/13/15  Yes Donnel Saxon, CNM  LORazepam (ATIVAN) 0.5 MG tablet Take 1 tablet by mouth 30 minutes before radiotherapy, PRN nausea 07/11/15  Yes Eppie Gibson, MD  meclizine (ANTIVERT) 12.5 MG tablet Take 12.5 mg by mouth at bedtime.  12/05/14  Yes Historical Provider, MD  megestrol (MEGACE) 40 MG tablet TAKE 2 TABLETS (80 MG TOTAL) BY MOUTH 3 (THREE) TIMES DAILY. 12/13/15  Yes Melissa D Cross, NP  metolazone (ZAROXOLYN) 5 MG tablet TAKE 1 TABLET (5 MG  TOTAL) BY MOUTH SEE ADMIN INSTRUCTIONS. 3 TIMES WEEKLY AS NEEDED FOR EDEMA 09/13/14  Yes Jettie Booze, MD  Multiple Vitamin (MULTIVITAMIN) capsule Take 1 capsule by mouth daily.     Yes Historical Provider, MD  mupirocin ointment (BACTROBAN) 2 % APPLY 1 APPLICATION TO AFFECTED AREA 3 TIMES DAILY AS NEEDED FOR INFECTION 03/09/15  Yes Historical Provider, MD  Omega-3 Fatty Acids (OMEGA-3 FISH OIL) 1200 MG CAPS Take 2 capsules (2,400 mg total) by mouth 2 (two) times daily. 09/07/14  Yes Jettie Booze, MD  omeprazole (PRILOSEC) 40 MG capsule Take 40 mg by mouth 2 (two) times daily.    Yes Historical Provider, MD  phenazopyridine (PYRIDIUM) 95 MG tablet Take 1 tablet (95 mg total) by mouth 3 (three) times daily as needed for pain. 08/05/15  Yes Melissa D Cross, NP  potassium chloride SA (K-DUR,KLOR-CON) 20 MEQ tablet Take 20 mEq by mouth 4 (four) times daily. 1 in AM, 2 at lunch, 2 at supper and 1 at bedtime   Yes Historical Provider, MD  primidone (MYSOLINE) 50 MG tablet Take 1 tablet (50 mg total) by mouth 2 (two) times daily. 09/16/15  Yes Donika Keith Rake, DO  Probiotic Product (PROBIOTIC PO) Take 1 tablet by mouth daily.    Yes Historical Provider, MD  promethazine (PHENERGAN) 25 MG tablet Take 1 tablet (25 mg total) by mouth every 4 (four) hours as needed for nausea or vomiting. 07/13/15  Yes Campbell Riches, MD  warfarin (COUMADIN) 5 MG tablet Take 2.5-5 mg by mouth daily at 6 PM. 22m Sunday and Friday, 2.558mall other days   Yes Historical Provider, MD  losartan (COZAAR) 100 MG tablet TAKE 1 TABLET (100MG) BY MOUTH DAILY. 12/20/15   JaJettie BoozeMD     Family History  Problem Relation Age of Onset  . Diabetes Father   . Hypertension Father   . Dementia Father     Deceased, 7768. Pneumonia Father   . Heart disease Mother   . Clotting disorder Mother   . Hypertension Mother   . Heart attack Mother     Deceased, 7014. Multiple myeloma Paternal Grandmother   . Cancer Paternal  Grandmother     multiple myoloma  . Heart disease Paternal Grandfather   . Kidney disease Maternal Grandmother   . Hypertension Sister   . Cancer Paternal Aunt     breast  . Cancer Cousin     breast - all three    Social History   Social History  . Marital Status: Single    Spouse Name: N/A  . Number of Children: 0  . Years of Education: N/A   Occupational History  . disabled    Social History Main Topics  . Smoking status: Never Smoker   . Smokeless tobacco: Never Used  . Alcohol Use: No     Comment: occasional - once or twice a year  . Drug Use: No  . Sexual Activity: Not Currently    Birth Control/ Protection: IUD   Other Topics Concern  . None   Social History Narrative   Lives alone.  She has been on disability since 1999 for morbid obesity.    Single, no children.   She has a caregiver that comes 7-days a week, ~5 hours each day.    Review of Systems: A 12 point ROS discussed  Review of Systems  Constitutional: Positive for activity change and fatigue. Negative for fever, chills and appetite change.  Respiratory: Positive for cough, shortness of breath and wheezing.   Cardiovascular: Positive for leg swelling. Negative for chest pain.  Gastrointestinal: Negative for nausea, vomiting and abdominal pain.  Genitourinary: Negative.   Musculoskeletal: Negative.   Skin: Negative.   Neurological: Negative.   Psychiatric/Behavioral: Negative.     Vital Signs: BP 167/89 mmHg  Pulse 90  Temp(Src) 98.6 F (37 C) (Oral)  Resp 16  Ht 5' 5" (1.651 m)  Wt 432 lb (195.954 kg)  BMI 71.89 kg/m2  SpO2 97%  Physical Exam  Constitutional: She is oriented to person, place, and time.  Morbidly obese  HENT:  Head: Atraumatic.  Eyes: EOM are normal.  Cardiovascular: Normal rate, regular rhythm and normal heart sounds.   Pulmonary/Chest:  Tracheostomy in place, On portable ventilator. Scattered rhonchi throughout both lungs.  Abdominal: Soft. There is no  tenderness.  Obese  Neurological: She is alert and oriented to person, place, and time.  Skin: Skin is warm and dry.  Psychiatric: She has a normal mood and affect. Her behavior is normal. Judgment and thought content normal.  Vitals reviewed.   Mallampati Score:  MD Evaluation Airway: Other (comments) Airway comments: Trach with portable vent Heart: WNL Abdomen: Other (comments) Abdomen comments: morbidly obese Chest/ Lungs: Other (comments) Chest/ lungs comments: Scattered rhonchi bilaterally ASA  Classification: 4 Mallampati/Airway Score:  (Trach with portable vent)  Imaging: Dg Chest 2 View  12/06/2015  CLINICAL DATA:  Lung and ovarian carcinoma. Two-month history of tracheostomy region bleeding EXAM: CHEST  2 VIEW COMPARISON:  Chest radiograph Nov 23, 2015; chest CT November 29, 2015 FINDINGS: Tracheostomy catheter tip is 4.7 cm above the carina. No pneumothorax. There are innumerable pulmonary metastatic lesions ranging in size from approximately 5 mm to as large as 1.5 cm. There is no airspace consolidation. Heart is mildly enlarged with pulmonary vascularity within normal limits. No adenopathy is evident. No bone lesions are evident. IMPRESSION: Widespread pulmonary metastatic nodular lesions throughout the lungs bilaterally. No airspace consolidation. No pneumothorax. Stable cardiac silhouette. Electronically Signed   By: Lowella Grip III [name]   On: 12/06/2015 12:44   Dg Chest 2 View  11/23/2015  CLINICAL DATA:  Fever, cough and  shortness of breath. Tracheostomy. History of uterine cancer per prior CT report. EXAM: CHEST  2 VIEW COMPARISON:  08/15/2015 chest radiograph. FINDINGS: Tracheostomy tube tip overlies the tracheal air column at the thoracic inlet. Stable cardiomediastinal silhouette with mild cardiomegaly. No pneumothorax. No pleural effusion. Extensive new patchy nodular airspace opacities throughout both lungs. IMPRESSION: Extensive new patchy nodular airspace opacities  throughout both lungs. Given the history of malignancy, findings are worrisome for pulmonary metastases. If the patient is immunocompromised, atypical infectious etiologies such as fungal pneumonia could be considered. Consider further evaluation with chest CT with IV contrast. These results will be called to the ordering clinician or representative by the Radiology Department at the imaging location. Electronically Signed   By: Ilona Sorrel [name]   On: 11/23/2015 12:32   Ct Chest W Contrast  11/29/2015  CLINICAL DATA:  Abnormal chest x-ray, endometrial cancer, obesity EXAM: CT CHEST WITH CONTRAST TECHNIQUE: Multidetector CT imaging of the chest was performed during intravenous contrast administration. CONTRAST:  190m ISOVUE-300 IOPAMIDOL (ISOVUE-300) INJECTION 61% COMPARISON:  CT scan 02/22/2015 FINDINGS: Mediastinum/Lymph Nodes: There are extensive quantum mottle artifacts from patient's large body habitus. Tracheostomy tube in place is noted. Collateral veins are noted right anterior and midline lower anterior chest wall. No mediastinal adenopathy. Right hilar lymph node measures 1.3 cm. Left hilar lymph node measures 1.4 cm. There is borderline cardiomegaly.  No pericardial effusion. Lungs/Pleura: As noted on chest x-ray innumerable bilateral pulmonary nodules are noted consistent with metastatic disease. The largest pleural based nodule in left upper lobe anteriorly measures 2.5 cm. Largest nodule in right upper lobe medially measures 2 cm. Largest nodule in left lower lobe centrally measures 2.3 cm. Largest nodule in right lower lobe centrally measures 2.2 cm. There is no segmental infiltrate or pulmonary edema. Upper abdomen: The visualized upper abdomen shows no adrenal gland mass. The visualized upper kidneys are unremarkable. No definite hepatic mass is noted. Patient is status postcholecystectomy. Visualized spleen is unremarkable. Musculoskeletal: Sagittal images of the spine shows degenerative changes  thoracic spine. Sagittal view of the sternum is unremarkable. No destructive bony lesions are noted. IMPRESSION: 1. Multiple bilateral pulmonary nodules are noted highly suspicious for metastatic disease. Further evaluation is recommended. 2. No acute infiltrate or pulmonary edema. Bilateral hilar adenopathy. 3. No bony metastatic disease. 4. Tracheostomy tube in place. These results were called by telephone at the time of interpretation on 11/29/2015 at 2:41 pm to Dr. GLaurann Montanawho verbally acknowledged these results. Electronically Signed   By: LLahoma CrockerM.D.   On: 11/29/2015 14:42   UKoreaTransvaginal Non-ob  11/22/2015  CLINICAL DATA:  Endometrial cancer. EXAM: TRANSABDOMINAL AND TRANSVAGINAL ULTRASOUND OF PELVIS TECHNIQUE: Both transabdominal and transvaginal ultrasound examinations of the pelvis were performed. Transabdominal technique was performed for global imaging of the pelvis including uterus, ovaries, adnexal regions, and pelvic cul-de-sac. It was necessary to proceed with endovaginal exam following the transabdominal exam to visualize the uterus, endometrium, and ovaries. COMPARISON:  08/02/2015. FINDINGS: Uterus Measurements: 10.7 x 7.2 x 7.7 cm. 2.7 x 2.4 x 2.7 cm anterior uterine fibroid. 2.7 x 2.5 x 2.6 cm posterior lower uterine segment fibroid. Similar findings noted on prior exam. Endometrium Thickness: 11 mm.  Stable small calcifications. Right ovary Not visualized. Left ovary Not visualized. Other findings No abnormal free fluid.  Exam limited by patient's body habitus. IMPRESSION: 1. Stable fibroid uterus. 2. Endometrium is stable and measures 11 mm in diameter. Stable ossifications are present. Electronically Signed   By: TMarcello Moores  Register   On: 11/22/2015 10:00   US Pelvis Complete  11/22/2015  CLINICAL DATA:  Endometrial cancer. EXAM: TRANSABDOMINAL AND TRANSVAGINAL ULTRASOUND OF PELVIS TECHNIQUE: Both transabdominal and transvaginal ultrasound examinations of the pelvis were performed.  Transabdominal technique was performed for global imaging of the pelvis including uterus, ovaries, adnexal regions, and pelvic cul-de-sac. It was necessary to proceed with endovaginal exam following the transabdominal exam to visualize the uterus, endometrium, and ovaries. COMPARISON:  08/02/2015. FINDINGS: Uterus Measurements: 10.7 x 7.2 x 7.7 cm. 2.7 x 2.4 x 2.7 cm anterior uterine fibroid. 2.7 x 2.5 x 2.6 cm posterior lower uterine segment fibroid. Similar findings noted on prior exam. Endometrium Thickness: 11 mm.  Stable small calcifications. Right ovary Not visualized. Left ovary Not visualized. Other findings No abnormal free fluid.  Exam limited by patient's body habitus. IMPRESSION: 1. Stable fibroid uterus. 2. Endometrium is stable and measures 11 mm in diameter. Stable ossifications are present. Electronically Signed   By: Marcello Moores  Register   On: 11/22/2015 10:00    Labs:  CBC:  Recent Labs  08/16/15 0409 08/19/15 0530 12/06/15 1245 12/21/15 1024  WBC 6.5 4.3 8.1 9.0  HGB 12.3 11.6* 14.0 11.7*  HCT 35.8* 35.5* 42.7 36.9  PLT 136* 148* 152 157    COAGS:  Recent Labs  08/17/15 0400 08/18/15 0641 08/19/15 0530 12/06/15 1207  INR 2.50* 2.25* 2.40* 2.24*    BMP:  Recent Labs  08/16/15 1314 08/17/15 0400 08/18/15 0641 11/29/15 1105 12/06/15 1207  NA 132* 134* 133*  --  139  K 3.3* 2.7* 3.4*  --  4.2  CL 93* 101 102  --  107  CO2 24 25 20*  --  25  GLUCOSE 437* 233* 375*  --  189*  BUN _0 --  15  CALCIUM 9.0 8.3* 8.4*  --  9.0  CREATININE 1.14* 0.85 0.93 0.80 0.88  GFRNONAA 54* >60 >60  --  >60  GFRAA >60 >60 >60  --  >60    LIVER FUNCTION TESTS:  Recent Labs  01/17/15 1515 06/01/15 1128 08/15/15 1238 08/16/15 0409  BILITOT 0.7 0.57 0.9 0.8  AST _1 ALT _2 ALKPHOS 45 66 75 60  PROT 7.0 8.1 7.8 6.6  ALBUMIN 3.4* 3.5 3.4* 2.9*    TUMOR MARKERS: No results for input(s): AFPTM, CEA, CA199, CHROMGRNA in the last 8760  hours.  Assessment and Plan:  History of endometrial cancer now with multiple bilateral pulmonary nodules.  Will proceed with CT guided lung biopsy today by Dr. Laurence Ferrari  Risks and Benefits discussed with the patient including, but not limited to bleeding, hemoptysis, respiratory failure requiring intubation, infection, pneumothorax requiring chest tube placement, stroke from air embolism or even death.  She does understand she is high risk given her co morbidities.  All of the patient's questions were answered, patient is agreeable to proceed. Consent signed and in chart.  Thank you for this interesting consult.  I greatly enjoyed meeting CECLIA KOKER and look forward to participating in their care.  A copy of this report was sent to the requesting provider on this date.  Electronically Signed: Murrell Redden PA-C 12/21/2015, 11:25 AM   I spent a total of 40 Minutes in face to face in clinical consultation, greater than 50% of which was counseling/coordinating care for CT guided lung biopsy.

## 2015-12-21 NOTE — Sedation Documentation (Signed)
dsg to R upper chest

## 2015-12-21 NOTE — Discharge Instructions (Signed)

## 2015-12-21 NOTE — Procedures (Signed)
Interventional Radiology Procedure Note  Procedure: CT guided biopsy of RUL pulmonary nodule Complications: No immediate Recommendations: - Bedrest until CXR cleared.  Minimize talking, coughing or otherwise straining.  - Follow up 2 hr CXR pending   Signed,  Akasha Melena K. Joci Dress, MD   

## 2015-12-23 ENCOUNTER — Telehealth: Payer: Self-pay | Admitting: Oncology

## 2015-12-23 NOTE — Telephone Encounter (Signed)
returned call and s.w. pt and confirmed appt....pt could not keep appt....transferred to [facility] in HIM

## 2015-12-26 ENCOUNTER — Ambulatory Visit: Payer: Medicare Other | Admitting: Oncology

## 2015-12-26 ENCOUNTER — Telehealth: Payer: Self-pay | Admitting: *Deleted

## 2015-12-26 ENCOUNTER — Telehealth: Payer: Self-pay | Admitting: Pulmonary Disease

## 2015-12-26 NOTE — Telephone Encounter (Signed)
Pt was scheduled with the [facility] today at 3:30pm Pt states that she needs morning appts only - anything after 9:30am Pt cancelled her appt and was not reschedule.   Called [facility] and spoke with Maudie Mercury in the [facility] dept - pt rescheduled to 8:30am on 01/06/16 for labs and 9am for the MD visit.  Maudie Mercury will be calling her to discuss appt further and can reschedule if date/time wont work.   Called pt - aware of appt date/time. Nothing further needed.

## 2015-12-26 NOTE — Telephone Encounter (Signed)
Notified patient of future appointments. Pt is scheduled for a chemo education class on 01/03/16 @ 9:30 am. Pt is scheduled on 01/06/16 for a lab and MD visit with Dr. Marko Plume @ 8:30 and 9:00. Pt agreed with time and date.

## 2015-12-28 NOTE — Progress Notes (Signed)
Quick Note:  Pt is scheduled to see Dr Marko Plume on 7.14.17 ______

## 2016-01-02 ENCOUNTER — Telehealth: Payer: Self-pay | Admitting: Interventional Cardiology

## 2016-01-02 NOTE — Telephone Encounter (Signed)
New message     The pt is returning some-one's call no-one left a message or note.

## 2016-01-02 NOTE — Telephone Encounter (Signed)
Calling stating that someone had called her but couldn't get to phone.  See that schedulers called to move appointment from 7/26 to 7/27.  Moved the appointment to 7/27 at 10:15.  She is agreeable unless she has to have chemo and she will call back if necessary.

## 2016-01-03 ENCOUNTER — Other Ambulatory Visit: Payer: Medicare Other

## 2016-01-03 ENCOUNTER — Telehealth: Payer: Self-pay | Admitting: Oncology

## 2016-01-03 ENCOUNTER — Encounter: Payer: Self-pay | Admitting: *Deleted

## 2016-01-03 ENCOUNTER — Telehealth: Payer: Self-pay

## 2016-01-03 ENCOUNTER — Other Ambulatory Visit: Payer: Self-pay

## 2016-01-03 NOTE — Telephone Encounter (Signed)
Ms [name] cannot make it to the education appointment this am due to not able to get into a vehicle for transportation. She also had increased SOB. Ms. [name] inquired about alternative transportation.  She spoke with Social Services and there is a Management consultant for transportation services. She inquired about inpatient chemotherapy.  Told her that this would have to be due to medical necessity and need prior approvial. Suggested she call EMS and inquire about cost and medicaid coverage and set a time up for Friday 01-06-16 as she needs initial appointment to discuss treatment options/plan of care.   Told Ms [name] that if information of a service becomes available prior to Friday 01-06-16 she will be notified. Will send a copy to this note to [place] at [facility].  Will add Ms. [name] to Education Nurse's schedule at 1100 on 01-06-16. Ms [name] aware.

## 2016-01-03 NOTE — Telephone Encounter (Signed)
Pt called to [facility] chemo education class for today. pt on ventilator and is having a hard time getting into the car. Connected pt to leave a message with dest nurse to inform LL of conditions and see what other options will be provided for the pt to receive care

## 2016-01-04 ENCOUNTER — Encounter: Payer: Self-pay | Admitting: *Deleted

## 2016-01-04 ENCOUNTER — Other Ambulatory Visit: Payer: Self-pay | Admitting: Oncology

## 2016-01-04 DIAGNOSIS — C541 Malignant neoplasm of endometrium: Secondary | ICD-10-CM

## 2016-01-04 NOTE — Progress Notes (Signed)
[place] Work  Clinical Social Work was referred by medical oncology RN transportation concerns to get to appointment with medical oncologist.  Clinical Social Worker contacted patient by phone to offer support and assess for needs.  Ms. Benoist shared she is on a ventilator and has a trach, therefore, must be transported by EMS or escorted by an RN or respiratory therapist.  CSW is not aware of any other transportation options besides non-emergency ambulance.  Patient has already arranged transportation through [facility]. CSW will notify RN and follow up with patient at medical oncology appointment.   Polo Riley, MSW, LCSW, OSW-C Clinical Social Worker [facility] [phone]

## 2016-01-06 ENCOUNTER — Encounter: Payer: Self-pay | Admitting: Oncology

## 2016-01-06 ENCOUNTER — Telehealth: Payer: Self-pay

## 2016-01-06 ENCOUNTER — Ambulatory Visit (HOSPITAL_BASED_OUTPATIENT_CLINIC_OR_DEPARTMENT_OTHER): Payer: Medicare Other | Admitting: Oncology

## 2016-01-06 ENCOUNTER — Other Ambulatory Visit (HOSPITAL_BASED_OUTPATIENT_CLINIC_OR_DEPARTMENT_OTHER): Payer: Medicare Other

## 2016-01-06 ENCOUNTER — Other Ambulatory Visit: Payer: Self-pay | Admitting: Oncology

## 2016-01-06 ENCOUNTER — Other Ambulatory Visit: Payer: Medicare Other

## 2016-01-06 VITALS — BP 120/71 | HR 81 | Temp 98.0°F | Resp 18 | Ht 65.0 in

## 2016-01-06 DIAGNOSIS — C541 Malignant neoplasm of endometrium: Secondary | ICD-10-CM

## 2016-01-06 DIAGNOSIS — E119 Type 2 diabetes mellitus without complications: Secondary | ICD-10-CM | POA: Diagnosis not present

## 2016-01-06 DIAGNOSIS — E662 Morbid (severe) obesity with alveolar hypoventilation: Secondary | ICD-10-CM

## 2016-01-06 DIAGNOSIS — C7801 Secondary malignant neoplasm of right lung: Secondary | ICD-10-CM | POA: Diagnosis not present

## 2016-01-06 DIAGNOSIS — Z9911 Dependence on respirator [ventilator] status: Secondary | ICD-10-CM | POA: Insufficient documentation

## 2016-01-06 DIAGNOSIS — Z794 Long term (current) use of insulin: Secondary | ICD-10-CM

## 2016-01-06 DIAGNOSIS — C7802 Secondary malignant neoplasm of left lung: Secondary | ICD-10-CM

## 2016-01-06 DIAGNOSIS — R042 Hemoptysis: Secondary | ICD-10-CM

## 2016-01-06 DIAGNOSIS — Z7901 Long term (current) use of anticoagulants: Secondary | ICD-10-CM

## 2016-01-06 DIAGNOSIS — Z930 Tracheostomy status: Secondary | ICD-10-CM

## 2016-01-06 DIAGNOSIS — C7800 Secondary malignant neoplasm of unspecified lung: Secondary | ICD-10-CM | POA: Insufficient documentation

## 2016-01-06 DIAGNOSIS — Z6841 Body Mass Index (BMI) 40.0 and over, adult: Secondary | ICD-10-CM

## 2016-01-06 DIAGNOSIS — R5381 Other malaise: Secondary | ICD-10-CM

## 2016-01-06 DIAGNOSIS — Z8614 Personal history of Methicillin resistant Staphylococcus aureus infection: Secondary | ICD-10-CM

## 2016-01-06 LAB — COMPREHENSIVE METABOLIC PANEL
ALBUMIN: 2.9 g/dL — AB (ref 3.5–5.0)
ALK PHOS: 61 U/L (ref 40–150)
ALT: 17 U/L (ref 0–55)
AST: 16 U/L (ref 5–34)
Anion Gap: 10 mEq/L (ref 3–11)
BILIRUBIN TOTAL: 0.42 mg/dL (ref 0.20–1.20)
BUN: 11.4 mg/dL (ref 7.0–26.0)
CO2: 27 meq/L (ref 22–29)
CREATININE: 1 mg/dL (ref 0.6–1.1)
Calcium: 9.5 mg/dL (ref 8.4–10.4)
Chloride: 103 mEq/L (ref 98–109)
EGFR: 67 mL/min/{1.73_m2} — AB (ref >90)
GLUCOSE: 238 mg/dL — AB (ref 70–140)
Potassium: 5.3 mEq/L — ABNORMAL HIGH (ref 3.5–5.1)
SODIUM: 139 meq/L (ref 136–145)
TOTAL PROTEIN: 8 g/dL (ref 6.4–8.3)

## 2016-01-06 LAB — CBC WITH DIFFERENTIAL/PLATELET
BASO%: 0.4 % (ref 0.0–2.0)
Basophils Absolute: 0 10*3/uL (ref 0.0–0.1)
EOS ABS: 0.1 10*3/uL (ref 0.0–0.5)
EOS%: 1.6 % (ref 0.0–7.0)
HCT: 35.3 % (ref 34.8–46.6)
HEMOGLOBIN: 11.4 g/dL — AB (ref 11.6–15.9)
LYMPH%: 6.7 % — ABNORMAL LOW (ref 14.0–49.7)
MCH: 28.4 pg (ref 25.1–34.0)
MCHC: 32.2 g/dL (ref 31.5–36.0)
MCV: 88.2 fL (ref 79.5–101.0)
MONO#: 1 10*3/uL — ABNORMAL HIGH (ref 0.1–0.9)
MONO%: 15.4 % — AB (ref 0.0–14.0)
NEUT%: 75.9 % (ref 38.4–76.8)
NEUTROS ABS: 4.9 10*3/uL (ref 1.5–6.5)
Platelets: 176 10*3/uL (ref 145–400)
RBC: 4.01 10*6/uL (ref 3.70–5.45)
RDW: 16.6 % — AB (ref 11.2–14.5)
WBC: 6.4 10*3/uL (ref 3.9–10.3)
lymph#: 0.4 10*3/uL — ABNORMAL LOW (ref 0.9–3.3)

## 2016-01-06 NOTE — Telephone Encounter (Signed)
Faxed completed and signed certification statement to [facility].Sent a copy to HIM to have order scanned into EMR.

## 2016-01-06 NOTE — Progress Notes (Signed)
[place] NEW PATIENT EVALUATION   Name: [name] Date: January 06, 2016  MRN: [id number] DOB: 05-30-1961  REFERRING PHYSICIAN: Praveen Mannam, D.ClarkePearson cc Kelton Pillar, MD, S.Isidore Moos, A.Stringer, J.Varanasi, S. Deveshwar, J.Kerr, G.Kuzma, J.Hatcher, W.Outlaw, D.Patel, J.Byers, K.Cabbell  Information from multiple specialties reviewed in this EMR and from [facility] for this consultation.  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 Situation is complicated by multiple severe comorbid problems including morbid obesity with BMI 72, chronic home ventilator support via tra, diabetes on insulin, cardiac disease, osteomyelitis fingers, history MRSA, long term anticoagulation. Performance status is ECOG 4.  Primary care is by Dr Kelton Pillar x 20+ years, with multiple other physicians involved as above. She has 24 hour care at home, with CRNAs thru day and RNs at night, as well as home respiratory therapist monthly and prn.  She is to see Dr Josephina Shih on 01-23-16.  Patient presented to Dr Stringer 2016 with vaginal bleeding, evaluation difficult with morbid obesity. She had hysteroscopy D & C with PAP and placement of Mirena IUD by Dr Raphael Gibney on 01-17-15 by Dr Raphael Gibney, that procedure complicated by hypotension requiring ICU intervention. 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 She was seen in consultation by Dr Josephina Shih in 01-2015, not candidate for hysterectomy secondary to multiple medical problems and morbid obesity; recommendation was radiation and ongoing Mirena IUD. Pelvic US 02-02-15 had endometrial stripe 13.4 mm. She had increased vaginal bleeding despite Mirena IUD and on  megace, with pelvic US 04-25-15 endometrial stripe 18 mm.  She received pelvic radiation by Dr Isidore Moos with 45 Gy to pelvis with uterine boost of additional 5.4 Gy, from 06-06-15 thru 07-22-15; she had consultation with Dr Honor Loh at [facility] as not technically possible to do vaginal brachytherapy at [facility], however patient preferred not to have this at [facility] and instead had uterine boost by Dr Isidore Moos. Vaginal bleeding resolved with radiation and has not reoccurred. She continues Megace.  Patient developed increased SOB, cough and some blood from trach ~ late May 2017, evaluated by Dr Janace Hoard with trach not felt to be source of the bleeding. CXR 2 view 11-23-15 compared with 07-2015 showed extensive new patchy nodular airspace opacities bilaterally, then CT chest 11-29-15 which showed innumerable bilateral pulmonary nodules consistent with metastatic disease, up to 2.5 cm. She was seen by Dr Vaughan Browner, information shared with radiation oncology and gyn oncology. CT guided biopsy was obtained 9-70-26 without complication, pathology ([id number]) showing poorly differentiated carcinoma with morphology identical to endometrial specimen from 2016.  Whereas patient had previously been on ventilator only at night, for past few weeks she has required ventilator 24 hours daily. She is also now too weak to use walker to get from chair to BR, so is using BSC next to recliner. She was unable to stand to be weighed today. She has cough productive of thick sputum. Hemoptysis initially was frank blood in significant amount per patient and RN, last episode of hemoptysis earlier this week but overall less in past several days. No vaginal bleeding now, no other bleeding. Note she is on chronic coumadin, apparently remote DVT and certainly very high risk for clots with immobility, megace etc.   She was hospitalized in 07-2015 with DKA, blood sugar  790 on admission.    REVIEW OF SYSTEMS as above, also: Mild HA "when I bend over", no  other neurologic symptoms. Reading glasses. Some clear sinus drainage. No known active dental problems. Cannot use [place] O2/ does not breathe thru nose. Occasional temps to ~ 99 at hs without clear localizing symptoms of infection, no shaking chills. No central catheter. Some wheezing. No noted changes in breasts. Appetite good, some GERD and some nausea, does not vomit "I cannot vomit". Bowels loose at times. Chronic MRSA osteomyelitis fingers much better with present regimen at home, including listerine soaks. Bladder ok. Chronic swelling RLE > LLE, new skin tear on LLE. Skin irritation without breakdown beneath panus and beneath breasts. States blood sugar 170 this AM.   Remainder of full 10 point review of systems negative.   ALLERGIES: Codeine; Molds & smuts; Allopurinol; Ciprofloxacin; Doxycycline; Fexofenadine; Keflex; Nitroglycerin er; Oritavancin; Sulfa drugs cross reactors; Accolate; Amoxicillin-pot clavulanate; Morphine; Nystatin; and Septra  PAST MEDICAL/ SURGICAL HISTORY:    G0P0 Endometrial ablation 2008 C diff ~ 7 yrs ago OSA, trach by Dr Janace Hoard 16 years ago, changed monthly Asthma Splenomegaly per EMR Gout: fingers, ankles, knee Polyneuropathy and tremors UE CHF diagnosed 1999 Elevated lipids Diabetes since 2004, does not use SS insulin, checks blood sugars at home "try to keep <200" DKA 07-2015, blood sugar 790 on admission Osteoarthritis Abdominal abscess MRSA 2017 resolved 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 24 hour assistance from CRNAs during day and RNs at night, with respiratory therapist also following. Never smoker, no ETOH, no transfusions. Now on ventilator continuously, unable to ambulate, still manages transfer from recliner to [facility] adjacent, sleeps in recliner. Required EMS transport from home for this visit. She does  not have advance directives, but has been given that information and plans to complete paperwork, then will have notarizaed when she comes to [facility] see Dr Josephina Shih on 01-23-16.  RN who accompanies has assisted in her care for several years, lives near patient, and is here today on her day off. RN previously worked in ICU at [employer], then x 10 years at [employer] prior to home care nursing.   FAMILY HISTORY:   Father died with pneumonia, Parkinson's, DM, HTN Mother died of MI at age 37 1 sister with HTN and arthritis Paternal aunt and cousins breast ca Paternal grandmother multiple myeloma Maternal grandfather some type of cancer      PHYSICAL EXAM:  height is 5' 5" (1.651 m). Her oral temperature is 98 F (36.7 C). Her blood pressure is 120/71 and her pulse is 81. Her respiration is 18 and oxygen saturation is 100%.  Alert, pleasant, cooperative, morbidly obese lady in wheelchair with respiratory assistance device via trach, very articulate speech. Does not appear in acute discomfort and no obvious respiratory discomfort. Unable to reposition feet/ legs without assistance.   HEENT: normal hair pattern. PERRL, not icteric. Fair dentition, oral mucosa somewhat dry, no lesions. Cannot appreciate JVD. Tracheostomy, tubing without secretions or blood.   RESPIRATORY: lungs clear to auscultation anteriorly and posteriorly, diminished BS bases consistent with habitus. Respirations not labored.  CARDIAC/ VASCULAR: Heart RRR no gallop. Cannot palpate LE pulses.   ABDOMEN: massive panus extending below knees seated. Cannot appreciate abdominal distension, fluid wave, any organomegaly. Some BS present. Not tender epigastrium.   LYMPH NODES: no cervical nodes apparent. Thick supraclavicular fat  pads, cannot appreciate adenopathy. No axillary nodes. Cannot examine inguinal nodes.   BREASTS: bilaterally without dominant mass, nipple discharge, skin changes of concern for malignancy.  NEUROLOGIC:  Speech easily understandable. CN intact. Limited movement hands. No tremor obvious.  PSYCH appropriate mood and affect  SKIN: Maceration and cutaneous yeast inferior breasts bilaterally. Cannot position to see in folds beneath panus. Chronic venous stasis changes left lower leg > right, dry dressing to skin tear on left. Chronic changes distal fingers bilaterally. No ecchymoses, no rash including left shoulder.   MUSCULOSKELETAL: able to lean forward in WC, back apparently not tender. Chronic induration and 1+ swelling lower legs bilaterally. Hands as above.     LABORATORY DATA:  Results for orders placed or performed in visit on 01/06/16 (from the past 48 hour(s))  CBC with Differential     Status: Abnormal   Collection Time: 01/06/16  8:37 AM  Result Value Ref Range   WBC 6.4 3.9 - 10.3 10e3/uL   NEUT# 4.9 1.5 - 6.5 10e3/uL   HGB 11.4 (L) 11.6 - 15.9 g/dL   HCT 35.3 34.8 - 46.6 %   Platelets 176 145 - 400 10e3/uL   MCV 88.2 79.5 - 101.0 fL   MCH 28.4 25.1 - 34.0 pg   MCHC 32.2 31.5 - 36.0 g/dL   RBC 4.01 3.70 - 5.45 10e6/uL   RDW 16.6 (H) 11.2 - 14.5 %   lymph# 0.4 (L) 0.9 - 3.3 10e3/uL   MONO# 1.0 (H) 0.1 - 0.9 10e3/uL   Eosinophils Absolute 0.1 0.0 - 0.5 10e3/uL   Basophils Absolute 0.0 0.0 - 0.1 10e3/uL   NEUT% 75.9 38.4 - 76.8 %   LYMPH% 6.7 (L) 14.0 - 49.7 %   MONO% 15.4 (H) 0.0 - 14.0 %   EOS% 1.6 0.0 - 7.0 %   BASO% 0.4 0.0 - 2.0 %  Comprehensive metabolic panel     Status: Abnormal   Collection Time: 01/06/16  8:37 AM  Result Value Ref Range   Sodium 139 136 - 145 mEq/L   Potassium 5.3 Repeated and Verified (H) 3.5 - 5.1 mEq/L   Chloride 103 98 - 109 mEq/L   CO2 27 22 - 29 mEq/L   Glucose 238 (H) 70 - 140 mg/dl    Comment: Glucose reference range is for nonfasting patients. Fasting glucose reference range is 70- 100.   BUN 11.4 7.0 - 26.0 mg/dL   Creatinine 1.0 0.6 - 1.1 mg/dL   Total Bilirubin 0.42 0.20 - 1.20 mg/dL   Alkaline Phosphatase 61 40 - 150 U/L   AST  16 5 - 34 U/L   ALT 17 0 - 55 U/L   Total Protein 8.0 6.4 - 8.3 g/dL   Albumin 2.9 (L) 3.5 - 5.0 g/dL   Calcium 9.5 8.4 - 10.4 mg/dL   Anion Gap 10 3 - 11 mEq/L   EGFR 67 (L) >90 ml/min/1.73 m2    Comment: eGFR is calculated using the CKD-EPI Creatinine Equation (2009)    At request of Dr Laurann Montana, PT INR also drawn today, those results to ordering physician   PATHOLOGY: ANNELLE, BEHRENDT ([id number]) Patient: Bianca, Henry Collected: 01/17/2015 Client: [facility] Accession: [id number] Received: 01/17/2015 [name] REPORT OF SURGICAL PATHOLOGYAL DIAGNOSIS Diagnosis 1. Endocervix, curettage - ADENOCARCINOMA. - SEE DESCRIPTION. 2. Endometrium, curettage - ADENOCARCINOMA. - SEE DESCRIPTION. Microscopic Comment 1. , and 2. The endocervical curettings and endometrium are involved by adenocarcinoma, much of which has endometrioid features. There are foci with  papillary architecture; however, the nuclear features are not entirely typical of serous carcinoma. The features are consistent with at least FIGO grade 2 endometrioid adenocarcinoma, and an endometrial origin is favored.   Bianca, Henry ([id number]) Patient: Bianca, Henry Collected: 12/21/2015 Client: [facility] Accession: [id number] Received: 12/21/2015 Jacqulynn Cadet, MDL DIAGNOSIS Diagnosis Lung, needle/core biopsy(ies), RUL pulm nodule - POORLY DIFFERENTIATED CARCINOMA, SEE COMMENT. Microscopic Comment There are sheets and nests of poorly differentiated cells. There is focal vague cribriforming. The patient's prior biopsies [id number] are reviewed and the tumor is morphologically identical, thus these findings a consistent with metastatic disease.   RADIOGRAPHY: EXAM: CHEST 2 VIEW  COMPARISON: 08/15/2015 chest radiograph.  FINDINGS: Tracheostomy tube tip overlies the tracheal air column at the thoracic inlet. Stable cardiomediastinal silhouette with mild cardiomegaly. No  pneumothorax. No pleural effusion. Extensive new patchy nodular airspace opacities throughout both lungs.  IMPRESSION: Extensive new patchy nodular airspace opacities throughout both lungs. Given the history of malignancy, findings are worrisome for pulmonary metastases. If the patient is immunocompromised, atypical infectious etiologies such as fungal pneumonia could be considered. Consider further evaluation with chest CT with IV contrast.    EXAM: CT CHEST WITH CONTRAST 11-29-15  COMPARISON: CT scan 02/22/2015  FINDINGS: Mediastinum/Lymph Nodes: There are extensive quantum mottle artifacts from patient's large body habitus. Tracheostomy tube in place is noted. Collateral veins are noted right anterior and midline lower anterior chest wall. No mediastinal adenopathy. Right hilar lymph node measures 1.3 cm. Left hilar lymph node measures 1.4 cm.  There is borderline cardiomegaly. No pericardial effusion.  Lungs/Pleura: As noted on chest x-ray innumerable bilateral pulmonary nodules are noted consistent with metastatic disease. The largest pleural based nodule in left upper lobe anteriorly measures 2.5 cm. Largest nodule in right upper lobe medially measures 2 cm. Largest nodule in left lower lobe centrally measures 2.3 cm. Largest nodule in right lower lobe centrally measures 2.2 cm. There is no segmental infiltrate or pulmonary edema.  Upper abdomen: The visualized upper abdomen shows no adrenal gland mass. The visualized upper kidneys are unremarkable. No definite hepatic mass is noted. Patient is status postcholecystectomy. Visualized spleen is unremarkable.  Musculoskeletal: Sagittal images of the spine shows degenerative changes thoracic spine. Sagittal view of the sternum is unremarkable. No destructive bony lesions are noted.  IMPRESSION: 1. Multiple bilateral pulmonary nodules are noted highly suspicious for metastatic disease. Further evaluation is  recommended. 2. No acute infiltrate or pulmonary edema. Bilateral hilar adenopathy. 3. No bony metastatic disease. 4. Tracheostomy tube in place.   PACs images reviewed by MD. Patient declined to see images.   DISCUSSION: All of history above reviewed with patient and caregiver, including circumstances surrounding initial diagnosis, interventions then, findings of concern on imaging (new on CXR 5-31 compared with 08-15-15) and pathology information from biopsy of pulmonary nodule (patient not aware of path results until now). She has not had recent imaging of abdomen pelvis, tho no new or uncontrolled symptoms in those areas now.   I have explained that metastatic endometrial cancer to lungs cannot be cured. In patients who are otherwise well enough to try chemotherapy, treatment would be in attempt to improve and control disease for some amount of time. I have explained that even the most gentle chemotherapy has possible significant side effects, and that risks can outweigh benefits in patients with other comorbid conditions. In addition, her PS is ECOG 4, much too poor for chemotherapy to be tolerable or beneficial. I have mentioned particular concerns  re logistics of outpatient follow up, likelihood of infection (pulmonary, MRSA including concern with central line ), uncontrolled blood sugars, worsening quality of life. Patient and caregiver are in full agreement with recommendation not to attempt chemotherapy. She wonders if any other treatment might be possible, and we have discussed alternating tamoxifen and megace, tho obviously chance of this helping is very slim. I mentioned possible withdrawal uterine bleeding if interrupts megace, which patient would not want to happen; I will discuss with Dr Josephina Shih and let her know by phone if he feels alternating tamoxifen and megace q 3 weeks would be a consideration. If so, she should be able to begin tamoxifen within the week, so would be able to see  how she is on this at his upcoming appointment on 01-23-16.  She is to see Dr Laurann Montana next in August, at least q 3 months at that office.  With difficult transportation etc, certainly her physicians can share information and alternate visits.  Chronic anticoagulation certainly appropriate from standpoint of immobility, morbid obesity, hx DVT and ongoing megace, tho also would be appropriate to hold coumadin if significant bleeding.    We discussed Advance Directives, written information given. I have explained that resuscitation and aggressive life support would not improve the underlying malignancy and could prolong suffering; I mentioned that DNR status still allows interventions short of resuscitation and life support that might benefit. RN friend is glad to help patient complete the forms, which can be notarized when she is back at this office for Dr Tamela Oddi appointment on 01-23-16.  I did not discuss Hospice, as I am not certain how that assistance would be managed with present [place], and certainly present support is necessary for her to be out of care facility.      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 Diagnosis 1 year ago, not surgical candidate, post radiation with resolution of vaginal bleeding. Patient is symptomatic with increased SOB requiring continuous respiratory support and intermittent hemoptysis thought related to lung pathology. Performance status is declining and too poor for chemotherapy. Consider alternating tamoxifen and megace every 3 weeks if Dr Josephina Shih agrees, as she would probably tolerate this well tho chance of benefit slim. She would be hospice appropriate if that assistance would be of enough benefit to change present home care; I did not discuss hospice with her now. 2.morbid obesity: last weight 432 lbs in 11-2015, BMI 72 3.tracheostomy dependence x 16 years and continuous  ventilator support, O2.  Has appointment with Dr Mannem7-17-17, followed also by Dr Janace Hoard. 4.diabetes since 2004,  on insulin. Admission for DKA 07-2015, A1c 14.3. [name] Blood sugar fasting now >200.  5.chronic anticoagulation with coumadin: remote DVT tho patient does not recall details. At risk for clots with immobility, gyn cancer and  megace, but would still hold anticoagulation if significant bleeding.  6. Completion of Advance Directives pending.  7. Obesity hypoventilation syndrome: trach and and now on continuous respiratory support.  8.HTN, diastolic dysfunction 9.past C difficile 10. MRSA including infection of digits of hands and abdominal wall, improving 11.hx gout followed by Dr Estanislado Pandy 12.polyneuropathy and tremor followed by Dr Posey Pronto  Patient and accompanying individual have had questions answered to their satisfaction and are in agreement with plan above. They can contact this office for questions or concerns at any time prior to next scheduled visit. I will communicate with other physicians involved and let patient know re tamoxifen/ megace after discussion  with Dr Josephina Shih. If she begins that treatment, I will likely see her back 4-6 weeks after Dr Delene Ruffini August visit.   Time spent 90 min, including >50% discussion and coordination of care.    Evlyn Clines, MD 01/06/2016 10:36 AM

## 2016-01-07 ENCOUNTER — Emergency Department (HOSPITAL_COMMUNITY)
Admission: EM | Admit: 2016-01-07 | Discharge: 2016-01-07 | Disposition: A | Discharge status: Home / Self Care | Payer: Medicare Other | Attending: Emergency Medicine | Admitting: Emergency Medicine

## 2016-01-07 ENCOUNTER — Other Ambulatory Visit: Payer: Self-pay

## 2016-01-07 ENCOUNTER — Emergency Department (HOSPITAL_COMMUNITY): Payer: Medicare Other

## 2016-01-07 ENCOUNTER — Encounter (HOSPITAL_COMMUNITY): Payer: Self-pay | Admitting: Emergency Medicine

## 2016-01-07 DIAGNOSIS — R079 Chest pain, unspecified: Secondary | ICD-10-CM | POA: Diagnosis not present

## 2016-01-07 DIAGNOSIS — Z7901 Long term (current) use of anticoagulants: Secondary | ICD-10-CM | POA: Insufficient documentation

## 2016-01-07 DIAGNOSIS — Z79899 Other long term (current) drug therapy: Secondary | ICD-10-CM | POA: Diagnosis not present

## 2016-01-07 DIAGNOSIS — J45909 Unspecified asthma, uncomplicated: Secondary | ICD-10-CM | POA: Diagnosis not present

## 2016-01-07 DIAGNOSIS — E1121 Type 2 diabetes mellitus with diabetic nephropathy: Secondary | ICD-10-CM | POA: Insufficient documentation

## 2016-01-07 DIAGNOSIS — I509 Heart failure, unspecified: Secondary | ICD-10-CM | POA: Insufficient documentation

## 2016-01-07 DIAGNOSIS — C7800 Secondary malignant neoplasm of unspecified lung: Secondary | ICD-10-CM | POA: Diagnosis not present

## 2016-01-07 DIAGNOSIS — R0602 Shortness of breath: Secondary | ICD-10-CM | POA: Diagnosis not present

## 2016-01-07 DIAGNOSIS — N189 Chronic kidney disease, unspecified: Secondary | ICD-10-CM | POA: Diagnosis not present

## 2016-01-07 DIAGNOSIS — I129 Hypertensive chronic kidney disease with stage 1 through stage 4 chronic kidney disease, or unspecified chronic kidney disease: Secondary | ICD-10-CM | POA: Diagnosis not present

## 2016-01-07 DIAGNOSIS — Z794 Long term (current) use of insulin: Secondary | ICD-10-CM | POA: Insufficient documentation

## 2016-01-07 LAB — CBC WITH DIFFERENTIAL/PLATELET
Basophils Absolute: 0.1 10*3/uL (ref 0.0–0.1)
Basophils Relative: 1 %
Eosinophils Absolute: 0.1 10*3/uL (ref 0.0–0.7)
Eosinophils Relative: 2 %
HEMATOCRIT: 34.9 % — AB (ref 36.0–46.0)
Hemoglobin: 10.8 g/dL — ABNORMAL LOW (ref 12.0–15.0)
LYMPHS ABS: 0.9 10*3/uL (ref 0.7–4.0)
LYMPHS PCT: 14 %
MCH: 28.1 pg (ref 26.0–34.0)
MCHC: 30.9 g/dL (ref 30.0–36.0)
MCV: 90.9 fL (ref 78.0–100.0)
MONO ABS: 0.7 10*3/uL (ref 0.1–1.0)
MONOS PCT: 10 %
NEUTROS ABS: 5.1 10*3/uL (ref 1.7–7.7)
Neutrophils Relative %: 75 %
Platelets: 162 10*3/uL (ref 150–400)
RBC: 3.84 MIL/uL — ABNORMAL LOW (ref 3.87–5.11)
RDW: 15.9 % — AB (ref 11.5–15.5)
WBC: 6.8 10*3/uL (ref 4.0–10.5)

## 2016-01-07 LAB — BRAIN NATRIURETIC PEPTIDE: B NATRIURETIC PEPTIDE 5: 17.5 pg/mL (ref 0.0–100.0)

## 2016-01-07 LAB — COMPREHENSIVE METABOLIC PANEL
ALT: 19 U/L (ref 14–54)
AST: 19 U/L (ref 15–41)
Albumin: 2.7 g/dL — ABNORMAL LOW (ref 3.5–5.0)
Alkaline Phosphatase: 49 U/L (ref 38–126)
Anion gap: 7 (ref 5–15)
BUN: 9 mg/dL (ref 6–20)
CHLORIDE: 102 mmol/L (ref 101–111)
CO2: 27 mmol/L (ref 22–32)
CREATININE: 0.73 mg/dL (ref 0.44–1.00)
Calcium: 9 mg/dL (ref 8.9–10.3)
GFR calc Af Amer: >60 mL/min (ref >60)
Glucose, Bld: 167 mg/dL — ABNORMAL HIGH (ref 65–99)
Potassium: 4.5 mmol/L (ref 3.5–5.1)
Sodium: 136 mmol/L (ref 135–145)
Total Bilirubin: 0.4 mg/dL (ref 0.3–1.2)
Total Protein: 7.1 g/dL (ref 6.5–8.1)

## 2016-01-07 LAB — I-STAT TROPONIN, ED: Troponin i, poc: 0 ng/mL (ref 0.00–0.08)

## 2016-01-07 LAB — URINALYSIS, ROUTINE W REFLEX MICROSCOPIC
BILIRUBIN URINE: NEGATIVE
Glucose, UA: NEGATIVE mg/dL
Hgb urine dipstick: NEGATIVE
Ketones, ur: NEGATIVE mg/dL
Leukocytes, UA: NEGATIVE
Nitrite: NEGATIVE
PH: 6 (ref 5.0–8.0)
Protein, ur: NEGATIVE mg/dL
SPECIFIC GRAVITY, URINE: 1.018 (ref 1.005–1.030)

## 2016-01-07 LAB — PROTIME-INR
INR: 1.99 — ABNORMAL HIGH (ref 0.00–1.49)
PROTHROMBIN TIME: 22.5 s — AB (ref 11.6–15.2)

## 2016-01-07 MED ORDER — IOPAMIDOL (ISOVUE-370) INJECTION 76%
INTRAVENOUS | Status: AC
  Filled 2016-01-07: qty 100

## 2016-01-07 MED ORDER — ALBUTEROL SULFATE (2.5 MG/3ML) 0.083% IN NEBU
5.0000 mg | INHALATION_SOLUTION | Freq: Once | RESPIRATORY_TRACT | Status: AC
  Administered 2016-01-07: 5 mg via RESPIRATORY_TRACT
  Filled 2016-01-07: qty 6

## 2016-01-07 MED ORDER — METHYLPREDNISOLONE SODIUM SUCC 125 MG IJ SOLR
125.0000 mg | Freq: Once | INTRAMUSCULAR | Status: AC
  Administered 2016-01-07: 125 mg via INTRAVENOUS
  Filled 2016-01-07: qty 2

## 2016-01-07 MED ORDER — LORAZEPAM 1 MG PO TABS
1.0000 mg | ORAL_TABLET | Freq: Once | ORAL | Status: AC
  Administered 2016-01-07: 1 mg via ORAL
  Filled 2016-01-07: qty 1

## 2016-01-07 MED ORDER — IPRATROPIUM BROMIDE 0.02 % IN SOLN
0.5000 mg | Freq: Once | RESPIRATORY_TRACT | Status: AC
  Administered 2016-01-07: 0.5 mg via RESPIRATORY_TRACT
  Filled 2016-01-07: qty 2.5

## 2016-01-07 NOTE — ED Provider Notes (Signed)
CSN: [id number]     Arrival date & time 01/07/16  1015 History   First MD Initiated Contact with Patient 01/07/16 1026     Chief Complaint  Patient presents with  . Chest Pain     (Consider location/radiation/quality/duration/timing/severity/associated sxs/prior Treatment) Patient is a 55 y.o. female presenting with chest pain. The history is provided by the patient and medical records.  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 Patient states this began yesterday. She states it is an aching pain on the right side of her chest. She does report some shortness of breath and wheezing. She does report a cough but denies fever, chills.  No increased swelling of the legs.  Unsure of any weight gain.  States she has constant home health nursing care and respiratory therapist 24/7.  Her trach is changed monthly, due to be changed next week.  Does have hx of DVT without hx of PE.    Past Medical History  Diagnosis Date  . Morbid obesity ([place])   . Depressive disorder, not elsewhere classified   . Asthma   . Dyslipidemia   . Clostridium difficile enterocolitis 2012  . Splenomegaly 06/01/2011  . Thrombocytopenia ([place]) 06/01/2011  . Hepatosplenomegaly   . Gout   . GERD (gastroesophageal reflux disease)   . Osteoarthritis   . Polyneuropathy ([place])   . Urinary incontinence   . Amenorrhea   . CHF (congestive heart failure) ([place])   . Hyperlipidemia   . Leg swelling   . Nausea   . Weakness   . Palpitations   . Anginal pain ([place])     no chest pain in years  . Dysrhythmia     heart palpations  . Anxiety   . Pneumonia   . Shortness of breath   . Chronic kidney  disease     overactive bladder  . Peripheral neuropathy ([place])   . DVT (deep venous thrombosis) ([place])   . OSA (obstructive sleep apnea)     uses ventilator ( has trach)  . Diabetes mellitus     type 2  . Irritable bowel syndrome   . PONV (postoperative nausea and vomiting)     just nausea  . Endometrial cancer ([place]) dx'd 01/19/15   Past Surgical History  Procedure Laterality Date  . Cholecystectomy    . Tracheostomy  2001  . Endometrial ablation    . Skin graft    . Finger surgery      ring finger on right hand  . Esophageal dilation  2003  . Breast biopsy      needle core, left  . Dilation and curettage of uterus      times 2  . Tracheal dilitation  01/24/2012    Procedure: TRACHEAL DILITATION;  Surgeon: Melissa Montane, MD;  Location: [facility];  Service: ENT;  Laterality: N/A;  Trache change with possible dilation, from size 6 to size 7 uncuffed  . Amputation Right 11/19/2013    Procedure: AMPUTATION DIGIT;  Surgeon: Cammie Sickle, MD;  Location: [facility];  Service: Orthopedics;  Laterality: Right;  Partial amputation right long finger, Debridement right ring finger   . Amputation Right 06/03/2014    Procedure: REVISION AMPUTATION RIGHT RING  FINGER;  Surgeon: Daryll Brod, MD;  Location: [facility];  Service: Orthopedics;  Laterality: Right;  . Dilatation & currettage/hysteroscopy with resectocope N/A 01/17/2015    Procedure: DILATATION & CURETTAGE/HYSTEROSCOPY WITH RESECTOCOPE; Pap Smear;  Surgeon: Ena Dawley, MD;  Location: [facility];  Service: Gynecology;  Laterality: N/A;  . Intrauterine device (iud) insertion N/A 01/17/2015    Procedure: INTRAUTERINE DEVICE (IUD) INSERTION;  Surgeon: Ena Dawley, MD;  Location: [facility];  Service: Gynecology;  Laterality: N/A;  . Finger surgery Bilateral 06/16/15    I & D of multiple fingers  . Incision and drainage Left 06/16/2015    Procedure: IRRIGATION AND DEBRIDEMENT OF LEFT RING FINGER AND LEFT SMALL  FINGER.;  Surgeon: Daryll Brod, MD;  Location: [facility];  Service: Orthopedics;  Laterality: Left;  . Dressing change under anesthesia Right 06/16/2015    Procedure: DRESSING CHANGE UNDER ANESTHESIA TO RIGHT THUMB AND RIGHT FIRST FINGER.;  Surgeon: Daryll Brod, MD;  Location: [facility];  Service: Orthopedics;  Laterality: Right;   Family History  Problem Relation Age of Onset  . Diabetes Father   . Hypertension Father   . Dementia Father     Deceased, 42  . Pneumonia Father   . Heart disease Mother   . Clotting disorder Mother   . Hypertension Mother   . Heart attack Mother     Deceased, 80  . Multiple myeloma Paternal Grandmother   . Cancer Paternal Grandmother     multiple myoloma  . Heart disease Paternal Grandfather   . Kidney disease Maternal Grandmother   . Hypertension Sister   . Cancer Paternal Aunt     breast  . Cancer Cousin     breast - all three   Social History  Substance Use Topics  . Smoking status: Never Smoker   . Smokeless tobacco: Never Used  . Alcohol Use: No     Comment: occasional - once or twice a year   OB History    Gravida Para Term Preterm AB TAB SAB Ectopic Multiple Living   0         0     Review of Systems  Respiratory: Positive for cough and shortness of breath.   Cardiovascular: Positive for chest pain.  All other systems reviewed and are negative.     Allergies  Codeine; Molds & smuts; Allopurinol; Ciprofloxacin; Doxycycline; Fexofenadine; Keflex; Nitroglycerin er; Oritavancin; Sulfa drugs cross reactors; Accolate; Amoxicillin-pot clavulanate; Morphine; Nystatin; and Septra  Home Medications   Prior to Admission medications   Medication Sig Start Date End Date Taking? Authorizing Provider  acetaminophen (TYLENOL) 500 MG tablet Take 1,000 mg by mouth every 6 (six) hours as needed for mild pain. For pain    Historical Provider, MD  albuterol (PROVENTIL) (2.5 MG/3ML) 0.083% nebulizer solution Take 2.5 mg by nebulization every 4 (four) hours  as needed. For shortness of breath 05/28/11   Chesley Mires, MD  atorvastatin (LIPITOR) 10 MG tablet Take 10 mg by mouth at bedtime.     Historical Provider, MD  budesonide (PULMICORT) 0.25 MG/2ML nebulizer solution Take 0.25 mg by nebulization 2 (two) times daily as needed (shortness of breath).    Historical Provider, MD  bumetanide (BUMEX) 2 MG tablet TAKE 1 TABLET (2 MG TOTAL) BY MOUTH DAILY. 09/14/15   Jettie Booze, MD  busPIRone (BUSPAR) 5 MG tablet Take 7.5 mg by mouth 2 (two) times daily.     Historical Provider, MD  BYSTOLIC 10 MG tablet TAKE 1  TABLET (10 MG TOTAL) BY MOUTH DAILY. 09/21/15   Jettie Booze, MD  cholestyramine Lucrezia Starch) 4 G packet Take 1 packet by mouth as needed. For loose stools, IBS.    Historical Provider, MD  clotrimazole (LOTRIMIN) 1 % cream Apply 1 application topically daily. To stomach    Historical Provider, MD  colchicine 0.6 MG tablet Take 0.6 mg by mouth daily.    Historical Provider, MD  diphenoxylate-atropine (LOMOTIL) 2.5-0.025 MG tablet Take 1-2 tablets by mouth 4 (four) times daily as needed for diarrhea or loose stools. 07/18/15   Eppie Gibson, MD  DULoxetine (CYMBALTA) 30 MG capsule Take 1 capsule (30 mg total) by mouth 2 (two) times daily. 09/16/15   Donika Keith Rake, DO  ergocalciferol (VITAMIN D2) 50000 UNITS capsule Take 50,000 Units by mouth once a week. Take on Fridays    Historical Provider, MD  Febuxostat (ULORIC) 80 MG TABS Take 80 mg by mouth at bedtime.     Historical Provider, MD  fesoterodine (TOVIAZ) 4 MG TB24 Take 4 mg by mouth daily.      Historical Provider, MD  gabapentin (NEURONTIN) 300 MG capsule Take 1 capsule (300 mg total) by mouth 2 (two) times daily. 09/16/15   Donika K Patel, DO  guaiFENesin (MUCINEX) 600 MG 12 hr tablet Take 600 mg by mouth 2 (two) times daily.     Historical Provider, MD  HUMULIN R U-500 KWIKPEN 500 UNIT/ML kwikpen Inject 110 Units into the skin daily with breakfast. Inject 110 units in am, 60 units with  lunch and 60 units with supper Patient taking differently: Inject 110 Units into the skin daily with breakfast. Inject 120 units in am, 80 units with lunch and 80 units with supper 08/19/15   Nishant Dhungel, MD  HYDROcodone-acetaminophen (NORCO) 5-325 MG tablet Take 1 tablet by mouth every 6 (six) hours as needed for moderate pain. 06/16/15   Daryll Brod, MD  ipratropium (ATROVENT) 0.02 % nebulizer solution Take 0.5 mg by nebulization 4 (four) times daily as needed for wheezing or shortness of breath.    Historical Provider, MD  ketoconazole (NIZORAL) 2 % cream Apply 1 application topically daily. 04/13/15   Donnel Saxon, CNM  LORazepam (ATIVAN) 0.5 MG tablet Take 1 tablet by mouth 30 minutes before radiotherapy, PRN nausea 07/11/15   Eppie Gibson, MD  losartan (COZAAR) 100 MG tablet TAKE 1 TABLET (100MG) BY MOUTH DAILY. 12/20/15   Jettie Booze, MD  meclizine (ANTIVERT) 12.5 MG tablet Take 12.5 mg by mouth at bedtime.  12/05/14   Historical Provider, MD  megestrol (MEGACE) 40 MG tablet TAKE 2 TABLETS (80 MG TOTAL) BY MOUTH 3 (THREE) TIMES DAILY. 12/13/15   Melissa D Cross, NP  metolazone (ZAROXOLYN) 5 MG tablet TAKE 1 TABLET (5 MG TOTAL) BY MOUTH SEE ADMIN INSTRUCTIONS. 3 TIMES WEEKLY AS NEEDED FOR EDEMA 09/13/14   Jettie Booze, MD  Multiple Vitamin (MULTIVITAMIN) capsule Take 1 capsule by mouth daily.      Historical Provider, MD  mupirocin ointment (BACTROBAN) 2 % APPLY 1 APPLICATION TO AFFECTED AREA 3 TIMES DAILY AS NEEDED FOR INFECTION 03/09/15   Historical Provider, MD  Omega-3 Fatty Acids (OMEGA-3 FISH OIL) 1200 MG CAPS Take 2 capsules (2,400 mg total) by mouth 2 (two) times daily. 09/07/14   Jettie Booze, MD  omeprazole (PRILOSEC) 40 MG capsule Take 40 mg by mouth 2 (two) times daily.     Historical Provider, MD  phenazopyridine (PYRIDIUM) 95 MG tablet Take 1 tablet (95  mg total) by mouth 3 (three) times daily as needed for pain. 08/05/15   Melissa D Cross, NP  potassium chloride SA  (K-DUR,KLOR-CON) 20 MEQ tablet Take 20 mEq by mouth 4 (four) times daily. 1 in AM, 2 at lunch, 2 at supper and 1 at bedtime    Historical Provider, MD  primidone (MYSOLINE) 50 MG tablet Take 1 tablet (50 mg total) by mouth 2 (two) times daily. 09/16/15   Alda Berthold, DO  Probiotic Product (PROBIOTIC PO) Take 1 tablet by mouth daily.     Historical Provider, MD  promethazine (PHENERGAN) 25 MG tablet Take 1 tablet (25 mg total) by mouth every 4 (four) hours as needed for nausea or vomiting. 07/13/15   Campbell Riches, MD  warfarin (COUMADIN) 5 MG tablet Take 2.5-5 mg by mouth daily at 6 PM. 52m Sunday and Friday, 2.56mall other days    Historical Provider, MD   BP 164/80 mmHg  Pulse 91  Temp(Src) 99 F (37.2 C) (Oral)  Resp 22  SpO2 100%   Physical Exam  Constitutional: She is oriented to person, place, and time. She appears well-developed and well-nourished. No distress.  Awake, alert; Morbidly obese  HENT:  Head: Normocephalic and atraumatic.  Mouth/Throat: Oropharynx is clear and moist.  Trach in place, no excessive secretions  Eyes: Conjunctivae and EOM are normal. Pupils are equal, round, and reactive to light.  Neck: Normal range of motion. Neck supple.  Cardiovascular: Normal rate, regular rhythm and normal heart sounds.   Pulmonary/Chest: Effort normal. No respiratory distress. She has wheezes. She has rhonchi.  Intermixed wheezes and rhonchi throughout, no rales noted; able to speak in short phrases  Abdominal: Soft. Bowel sounds are normal. There is no tenderness. There is no guarding.  Musculoskeletal: Normal range of motion.  2+ pitting edema of BLE (states baseline for her)  Neurological: She is alert and oriented to person, place, and time.  Skin: Skin is warm and dry. She is not diaphoretic.  Psychiatric: She has a normal mood and affect.  Nursing note and vitals reviewed.   ED Course  Procedures (including critical care time)  Labs Review Labs Reviewed  CBC  WITH DIFFERENTIAL/PLATELET - Abnormal; Notable for the following:    RBC 3.84 (*)    Hemoglobin 10.8 (*)    HCT 34.9 (*)    RDW 15.9 (*)    All other components within normal limits  COMPREHENSIVE METABOLIC PANEL - Abnormal; Notable for the following:    Glucose, Bld 167 (*)    Albumin 2.7 (*)    All other components within normal limits  PROTIME-INR - Abnormal; Notable for the following:    Prothrombin Time 22.5 (*)    INR 1.99 (*)    All other components within normal limits  BRAIN NATRIURETIC PEPTIDE  URINALYSIS, ROUTINE W REFLEX MICROSCOPIC [facility]    Imaging Review Dg Chest Portable 1 View  01/07/2016  CLINICAL DATA:  Chest pain, shortness of breath. Current history of lung cancer. EXAM: PORTABLE CHEST 1 VIEW COMPARISON:  Radiograph December 21, 2015. FINDINGS: Tracheostomy tube is noted. Stable cardiomediastinal silhouette. Stable diffuse pulmonary nodules are noted consistent with metastatic disease. No pneumothorax or significant pleural effusion is noted. Bony thorax is unremarkable. IMPRESSION: Stable diffuse pulmonary metastatic disease is noted. Electronically Signed   By: JaMarijo ConceptionM.D.   On: 01/07/2016 11:31   I have personally reviewed and evaluated these images and lab results as  part of my medical decision-making.   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 She is afebrile and nontoxic. She has a trach and is chronically vent dependent. Her vital signs remained stable on her usual settings. She has normal secretions in the trach. Her lungs have intermixed wheezes or rhonchi, no rales. She has chronic edema of her legs, states this is baseline.  She is on chronic Coumadin. Will plan for labs, chest x-ray.  Her EKG is reassuring. Patient given albuterol and Atrovent nebs as well as Solu-Medrol.  Labs are reassuring.  INR mildly subtherapeutic at 1.99. Chest x-ray with diffuse pulmonary metastases noted. PE was considered given her history of DVT and current cancer, however patient unable to lie flat for CTA despite several attempts.  Long discussion with patient about risk vs benefit of CTA chest to evaluate for PE.  After discussion with myself, attending physician, patient, and her home health nurses, patient has elected to go home without CT scan.  Feel this is reasonable given her advanced cancer as her viable interventions would likely be minimal.  She will remain on her coumadin.  Home health nurse will be staying with her tonight to monitor her closely.  She will follow-up with her primary care doctor.  Discussed plan with patient, he/she acknowledged understanding and agreed with plan of care.  Return precautions given for new or worsening symptoms.  Case discussed with attending physician, Dr. Kathrynn Humble, who evaluated patient and agrees with assessment and plan of care.  Larene Pickett, PA-C 01/07/16 [place], MD 01/08/16 [phone]

## 2016-01-07 NOTE — ED Notes (Signed)
Pt given meal and is awaiting advanced home care to bring supply for trach

## 2016-01-07 NOTE — Discharge Instructions (Signed)
Continue your home coumadin. Follow-up with your primary care doctor. Return here for any new/worsening symptoms.

## 2016-01-07 NOTE — ED Notes (Signed)
REcalled [facility] for transport

## 2016-01-07 NOTE — ED Notes (Signed)
Placed patient on the monitor and ekg done

## 2016-01-07 NOTE — ED Notes (Signed)
Pt unable to tolerate CT chest

## 2016-01-07 NOTE — ED Notes (Signed)
Pt here from home with c/o chest pain and sob , pt has lung CA ans is vent dependent pt received 324mg  os ASA , but is allergic to nitro

## 2016-01-09 ENCOUNTER — Ambulatory Visit (INDEPENDENT_AMBULATORY_CARE_PROVIDER_SITE_OTHER): Payer: Medicare Other | Admitting: Pulmonary Disease

## 2016-01-09 ENCOUNTER — Ambulatory Visit: Payer: Medicare Other | Admitting: Oncology

## 2016-01-09 ENCOUNTER — Telehealth: Payer: Self-pay | Admitting: Pulmonary Disease

## 2016-01-09 ENCOUNTER — Encounter: Payer: Self-pay | Admitting: Pulmonary Disease

## 2016-01-09 VITALS — BP 128/74 | HR 80

## 2016-01-09 DIAGNOSIS — R918 Other nonspecific abnormal finding of lung field: Secondary | ICD-10-CM

## 2016-01-09 DIAGNOSIS — C801 Malignant (primary) neoplasm, unspecified: Secondary | ICD-10-CM

## 2016-01-09 DIAGNOSIS — C799 Secondary malignant neoplasm of unspecified site: Secondary | ICD-10-CM

## 2016-01-09 MED ORDER — ZOLPIDEM TARTRATE 5 MG PO TABS: 5.0000 mg | ORAL_TABLET | Freq: Every evening | ORAL | Status: DC | PRN

## 2016-01-09 MED ORDER — IPRATROPIUM BROMIDE 0.02 % IN SOLN: 0.5000 mg | Freq: Four times a day (QID) | RESPIRATORY_TRACT | Status: DC | PRN

## 2016-01-09 MED ORDER — BUDESONIDE 0.25 MG/2ML IN SUSP: 0.2500 mg | Freq: Two times a day (BID) | RESPIRATORY_TRACT | Status: DC | PRN

## 2016-01-09 NOTE — Progress Notes (Signed)
Subjective:    Patient ID: [name], female    DOB: 02/16/1961, 55 y.o.   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 She has a diagnosis of endometrial cancer in July 2016. She has been treated with radiation therapy ending January 2017. She was seen in pulmonary clinic for  CT scan which showed multiple rounded opacities highly concerning for metastatic cancer to the lung. Subsequent CT guided biopsy has confirmed metastatic adenocarcinoma to lung from endometrial cancer.  She has been evaluated by oncology Dr. Marko Plume. She is not a candidate for chemo given functional status and may get started on tamoxifen and megace. She was evaluated in ED 2 days ago for dyspnea but was unable to lie flat for a CT scan of the chest.  She has history of severe OSA. She's had a tracheostomy placed 16 years ago and is on chronic vent at night. She is a nonsmoker with no known exposures.   DATA: CT chest 11/29/15 Images reviewed. Multiple rounded bilateral opacities concerning for metastatic cancer.  CT biopsy 12/21/15 Poorly differentiated carcinoma.  Social History: She is a never smoker, no alcohol, drug use.  Family history: Mother-heart attack, hypertension, clotting disorder. Father-dementia, diabetes, hypertension, pneumonia.  Past Medical History  Diagnosis Date  . Morbid obesity ([place])   . Depressive disorder, not elsewhere classified   . Asthma   . Dyslipidemia   . Clostridium difficile enterocolitis 2012  . Splenomegaly 06/01/2011  . Thrombocytopenia ([place]) 06/01/2011  . Hepatosplenomegaly   . Gout   . GERD (gastroesophageal reflux disease)   . Osteoarthritis   . Polyneuropathy ([place])   . Urinary incontinence   . Amenorrhea   . CHF (congestive heart failure) ([place])   . Hyperlipidemia   .  Leg swelling   . Nausea   . Weakness   . Palpitations   . Anginal pain ([place])     no chest pain in years  . Dysrhythmia     heart palpations  . Anxiety   . Pneumonia   . Shortness of breath   . Chronic kidney disease     overactive bladder  . Peripheral neuropathy ([place])   . DVT (deep venous thrombosis) ([place])   . OSA (obstructive sleep apnea)     uses ventilator ( has trach)  . Diabetes mellitus     type 2  . Irritable bowel syndrome   . PONV (postoperative nausea and vomiting)     just nausea  . Endometrial cancer ([place]) dx'd 01/19/15     Current outpatient prescriptions:  .  acetaminophen (TYLENOL) 500 MG tablet, Take 1,000 mg by mouth every 6 (six) hours as needed for mild pain. For pain, Disp: , Rfl:  .  albuterol (PROVENTIL) (2.5 MG/3ML) 0.083% nebulizer solution, Take 2.5 mg by nebulization every 4 (four) hours as needed for wheezing or shortness of breath. , Disp: , Rfl:  .  atorvastatin (LIPITOR) 10 MG tablet, Take 10 mg by mouth at bedtime. , Disp: , Rfl:  .  budesonide (PULMICORT) 0.25 MG/2ML nebulizer solution, Take 0.25 mg by nebulization 2 (two) times daily as needed (shortness of breath)., Disp: , Rfl:  .  bumetanide (BUMEX) 2 MG tablet, TAKE 1 TABLET (2 MG TOTAL) BY MOUTH DAILY., Disp: 90 tablet, Rfl: 0 .  busPIRone (BUSPAR) 5 MG tablet, Take  7.5 mg by mouth 2 (two) times daily. , Disp: , Rfl:  .  BYSTOLIC 10 MG tablet, TAKE 1 TABLET (10 MG TOTAL) BY MOUTH DAILY., Disp: 60 tablet, Rfl: 0 .  cholestyramine (QUESTRAN) 4 G packet, Take 4 g by mouth daily as needed (loose stools/ IBS). , Disp: , Rfl:  .  clotrimazole (LOTRIMIN) 1 % cream, Apply 1 application topically daily as needed (rash). To stomach, Disp: , Rfl:  .  colchicine 0.6 MG tablet, Take 0.6 mg by mouth daily., Disp: , Rfl:  .  diphenoxylate-atropine (LOMOTIL) 2.5-0.025 MG tablet, Take 1-2 tablets by mouth 4 (four) times daily as needed for diarrhea or loose stools., Disp: 60 tablet, Rfl: 1 .  DULoxetine  (CYMBALTA) 30 MG capsule, Take 1 capsule (30 mg total) by mouth 2 (two) times daily., Disp: 180 capsule, Rfl: 3 .  ergocalciferol (VITAMIN D2) 50000 UNITS capsule, Take 50,000 Units by mouth every Friday. Take on Friday, Disp: , Rfl:  .  Febuxostat (ULORIC) 80 MG TABS, Take 80 mg by mouth at bedtime. , Disp: , Rfl:  .  fesoterodine (TOVIAZ) 4 MG TB24, Take 4 mg by mouth daily.  , Disp: , Rfl:  .  gabapentin (NEURONTIN) 300 MG capsule, Take 1 capsule (300 mg total) by mouth 2 (two) times daily., Disp: 180 capsule, Rfl: 3 .  guaiFENesin (MUCINEX) 600 MG 12 hr tablet, Take 600 mg by mouth 2 (two) times daily. , Disp: , Rfl:  .  HUMULIN R U-500 KWIKPEN 500 UNIT/ML kwikpen, Inject 110 Units into the skin daily with breakfast. Inject 110 units in am, 60 units with lunch and 60 units with supper (Patient taking differently: Inject 80-120 Units into the skin 3 (three) times daily with meals. Inject 120 units subcutaneously daily before breakfast, 80 units before lunch and 80 units before supper), Disp: 30 mL, Rfl: 0 .  HYDROcodone-acetaminophen (NORCO) 5-325 MG tablet, Take 1 tablet by mouth every 6 (six) hours as needed for moderate pain., Disp: 30 tablet, Rfl: 0 .  ipratropium (ATROVENT) 0.02 % nebulizer solution, Take 0.5 mg by nebulization 4 (four) times daily as needed for wheezing or shortness of breath., Disp: , Rfl:  .  ketoconazole (NIZORAL) 2 % cream, Apply 1 application topically daily. (Patient taking differently: Apply 1 application topically daily as needed (rash). ), Disp: 15 g, Rfl: 1 .  Liraglutide (VICTOZA) 18 MG/3ML SOPN, Inject 1.8 mg into the skin daily at 12 noon., Disp: , Rfl:  .  LORazepam (ATIVAN) 0.5 MG tablet, Take 1 tablet by mouth 30 minutes before radiotherapy, PRN nausea (Patient not taking: Reported on 01/07/2016), Disp: 12 tablet, Rfl: 0 .  LORazepam (ATIVAN) 1 MG tablet, Take 1 mg by mouth 3 (three) times daily as needed for anxiety., Disp: , Rfl:  .  losartan (COZAAR) 100 MG  tablet, TAKE 1 TABLET (100MG ) BY MOUTH DAILY. (Patient taking differently: Take 100 mg by mouth daily. ), Disp: 30 tablet, Rfl: 0 .  meclizine (ANTIVERT) 12.5 MG tablet, Take 12.5 mg by mouth at bedtime. , Disp: , Rfl: 6 .  megestrol (MEGACE) 40 MG tablet, TAKE 2 TABLETS (80 MG TOTAL) BY MOUTH 3 (THREE) TIMES DAILY., Disp: 180 tablet, Rfl: 3 .  metolazone (ZAROXOLYN) 5 MG tablet, TAKE 1 TABLET (5 MG TOTAL) BY MOUTH SEE ADMIN INSTRUCTIONS. 3 TIMES WEEKLY AS NEEDED FOR EDEMA, Disp: 15 tablet, Rfl: 3 .  Multiple Vitamin (MULTIVITAMIN WITH MINERALS) TABS tablet, Take 1 tablet by mouth daily., Disp: , Rfl:  .  Omega-3 Fatty Acids (OMEGA-3 FISH OIL) 1200 MG CAPS, Take 2 capsules (2,400 mg total) by mouth 2 (two) times daily., Disp: 120 capsule, Rfl: 6 .  omeprazole (PRILOSEC) 40 MG capsule, Take 40 mg by mouth 2 (two) times daily. , Disp: , Rfl:  .  OXYGEN, Inhale 4 L into the lungs continuous. , Disp: , Rfl:  .  phenazopyridine (PYRIDIUM) 95 MG tablet, Take 1 tablet (95 mg total) by mouth 3 (three) times daily as needed for pain. (Patient not taking: Reported on 01/07/2016), Disp: 15 tablet, Rfl: 0 .  potassium chloride SA (K-DUR,KLOR-CON) 20 MEQ tablet, Take 20-40 mEq by mouth 4 (four) times daily. Take 1 tablet (20 meq) by mouth every morning and at bedtime, take 2 tablets (40 meq) with lunch and supper, Disp: , Rfl:  .  primidone (MYSOLINE) 50 MG tablet, Take 1 tablet (50 mg total) by mouth 2 (two) times daily., Disp: 180 tablet, Rfl: 3 .  Probiotic Product (PROBIOTIC PO), Take 1 tablet by mouth daily. , Disp: , Rfl:  .  promethazine (PHENERGAN) 25 MG tablet, Take 1 tablet (25 mg total) by mouth every 4 (four) hours as needed for nausea or vomiting., Disp: 60 tablet, Rfl: 3 .  warfarin (COUMADIN) 5 MG tablet, Take 2.5-5 mg by mouth daily after supper. Take 1 tablet (5 mg) by mouth on Saturday and Sunday, take 1/2 tablet (2.5 mg) Monday thru Friday, Disp: , Rfl:   Review of Systems Dyspnea, cough,  wheezing, intermittent hemoptysis. Denies any sputum production, fevers, chills, malaise, loss of weight, loss of appetite. Denies any nausea, vomiting, diarrhea, constipation. Denies any chest pain, palpitation. All other review of systems are negative.    Objective:   Physical Exam Blood pressure 128/74, pulse 80, SpO2 100 %. Gen: No apparent distress, obese Neuro: No gross focal deficits. HEENT: No JVD, lymphadenopathy, thyromegaly. RS: Clear, No wheeze or crackles CVS: S1-S2 heard, no murmurs rubs gallops. Abdomen: Soft, positive bowel sounds. Musculoskeletal: No edema.    Assessment & Plan:  #1 Metastatic cancer [name] has a recent diagnosis of metastatic endometrial adenocarcinoma to the lung. She is not a candidate for chemo and is being considered for tamoxifen, megace. Hospice was brought up for discussion today . She is still considering it and has not made up her mind yet. She has follow up appt with Dr. Fermin Schwab  #2 Chronic vent dependence, OSA She has progressive resp failure from metastatic cancer, now on vent 24/7 Orders placed for trach change. Now with #6 trach. On pulmicort, atrovent and albuterol nebs  #3 Intermittent hemoptysis She is at high risk for PE given the cancer. I would continue the coumadin for now. If the hemoptysis worsens then we would have no choice but to take her off the anticoagulation  #3 Anxiety, insomnia She is taking ativan 3 times a day. I will prescribe ambien to help with insomnia.  Plan: -Continue vent support -Trach change - Continue coumadin anticoagulation, monitor hemoptysis. - Ativan for anxiety and ambien for insomina.  Marshell Garfinkel MD [facility] Pager [phone] If no answer or after 3pm call: [phone] 01/09/2016, 11:16 AM

## 2016-01-09 NOTE — Addendum Note (Signed)
Addended by: Parke Poisson E on: 01/09/2016 05:40 PM   Modules accepted: Orders

## 2016-01-09 NOTE — Patient Instructions (Signed)
We will place the order for trach change Renew your nebulizer meds Continue taking the ativan for anxiety We will start ambien 5 mg at night to help with the insomnia.  Return in 3 months

## 2016-01-09 NOTE — Telephone Encounter (Signed)
Order placed at [facility] Detailed message left for [name] on her VM informing her of the above Nothing further needed

## 2016-01-09 NOTE — Addendum Note (Signed)
Addended by: Parke Poisson E on: 01/09/2016 03:11 PM   Modules accepted: Orders

## 2016-01-10 ENCOUNTER — Other Ambulatory Visit: Payer: Self-pay | Admitting: Interventional Cardiology

## 2016-01-10 ENCOUNTER — Other Ambulatory Visit: Payer: Self-pay

## 2016-01-10 MED ORDER — NEBIVOLOL HCL 10 MG PO TABS: ORAL_TABLET | ORAL | Status: DC

## 2016-01-11 ENCOUNTER — Emergency Department (HOSPITAL_COMMUNITY): Payer: Medicare Other

## 2016-01-11 ENCOUNTER — Encounter (HOSPITAL_COMMUNITY): Payer: Self-pay | Admitting: Emergency Medicine

## 2016-01-11 ENCOUNTER — Inpatient Hospital Stay (HOSPITAL_COMMUNITY)
Admission: EM | Admit: 2016-01-11 | Discharge: 2016-01-13 | DRG: 919 | Disposition: A | Discharge status: Hospice | Payer: Medicare Other | Attending: Internal Medicine | Admitting: Internal Medicine

## 2016-01-11 DIAGNOSIS — E1142 Type 2 diabetes mellitus with diabetic polyneuropathy: Secondary | ICD-10-CM | POA: Diagnosis not present

## 2016-01-11 DIAGNOSIS — R042 Hemoptysis: Secondary | ICD-10-CM | POA: Diagnosis present

## 2016-01-11 DIAGNOSIS — Z807 Family history of other malignant neoplasms of lymphoid, hematopoietic and related tissues: Secondary | ICD-10-CM | POA: Diagnosis not present

## 2016-01-11 DIAGNOSIS — Z515 Encounter for palliative care: Secondary | ICD-10-CM | POA: Insufficient documentation

## 2016-01-11 DIAGNOSIS — J0410 Acute tracheitis without obstruction: Secondary | ICD-10-CM | POA: Diagnosis present

## 2016-01-11 DIAGNOSIS — T85628A Displacement of other specified internal prosthetic devices, implants and grafts, initial encounter: Secondary | ICD-10-CM | POA: Diagnosis present

## 2016-01-11 DIAGNOSIS — Z66 Do not resuscitate: Secondary | ICD-10-CM | POA: Diagnosis present

## 2016-01-11 DIAGNOSIS — E1169 Type 2 diabetes mellitus with other specified complication: Secondary | ICD-10-CM

## 2016-01-11 DIAGNOSIS — Z794 Long term (current) use of insulin: Secondary | ICD-10-CM | POA: Diagnosis not present

## 2016-01-11 DIAGNOSIS — Z7189 Other specified counseling: Secondary | ICD-10-CM | POA: Diagnosis not present

## 2016-01-11 DIAGNOSIS — J9610 Chronic respiratory failure, unspecified whether with hypoxia or hypercapnia: Secondary | ICD-10-CM | POA: Diagnosis present

## 2016-01-11 DIAGNOSIS — C541 Malignant neoplasm of endometrium: Secondary | ICD-10-CM | POA: Diagnosis present

## 2016-01-11 DIAGNOSIS — J9621 Acute and chronic respiratory failure with hypoxia: Secondary | ICD-10-CM | POA: Diagnosis present

## 2016-01-11 DIAGNOSIS — Z7901 Long term (current) use of anticoagulants: Secondary | ICD-10-CM

## 2016-01-11 DIAGNOSIS — E782 Mixed hyperlipidemia: Secondary | ICD-10-CM | POA: Diagnosis present

## 2016-01-11 DIAGNOSIS — I130 Hypertensive heart and chronic kidney disease with heart failure and stage 1 through stage 4 chronic kidney disease, or unspecified chronic kidney disease: Secondary | ICD-10-CM | POA: Diagnosis present

## 2016-01-11 DIAGNOSIS — I2510 Atherosclerotic heart disease of native coronary artery without angina pectoris: Secondary | ICD-10-CM | POA: Diagnosis present

## 2016-01-11 DIAGNOSIS — J95851 Ventilator associated pneumonia: Secondary | ICD-10-CM | POA: Diagnosis not present

## 2016-01-11 DIAGNOSIS — J449 Chronic obstructive pulmonary disease, unspecified: Secondary | ICD-10-CM | POA: Diagnosis not present

## 2016-01-11 DIAGNOSIS — M109 Gout, unspecified: Secondary | ICD-10-CM | POA: Diagnosis present

## 2016-01-11 DIAGNOSIS — E662 Morbid (severe) obesity with alveolar hypoventilation: Secondary | ICD-10-CM

## 2016-01-11 DIAGNOSIS — K219 Gastro-esophageal reflux disease without esophagitis: Secondary | ICD-10-CM | POA: Diagnosis not present

## 2016-01-11 DIAGNOSIS — Z9911 Dependence on respirator [ventilator] status: Secondary | ICD-10-CM | POA: Diagnosis present

## 2016-01-11 DIAGNOSIS — Z6841 Body Mass Index (BMI) 40.0 and over, adult: Secondary | ICD-10-CM | POA: Diagnosis not present

## 2016-01-11 DIAGNOSIS — C7800 Secondary malignant neoplasm of unspecified lung: Secondary | ICD-10-CM | POA: Insufficient documentation

## 2016-01-11 DIAGNOSIS — J9600 Acute respiratory failure, unspecified whether with hypoxia or hypercapnia: Secondary | ICD-10-CM | POA: Diagnosis present

## 2016-01-11 DIAGNOSIS — Y828 Other medical devices associated with adverse incidents: Secondary | ICD-10-CM | POA: Diagnosis present

## 2016-01-11 DIAGNOSIS — E669 Obesity, unspecified: Secondary | ICD-10-CM | POA: Diagnosis present

## 2016-01-11 DIAGNOSIS — J9612 Chronic respiratory failure with hypercapnia: Secondary | ICD-10-CM | POA: Diagnosis not present

## 2016-01-11 DIAGNOSIS — Z930 Tracheostomy status: Secondary | ICD-10-CM

## 2016-01-11 DIAGNOSIS — R0600 Dyspnea, unspecified: Secondary | ICD-10-CM | POA: Diagnosis not present

## 2016-01-11 DIAGNOSIS — R110 Nausea: Secondary | ICD-10-CM | POA: Diagnosis not present

## 2016-01-11 DIAGNOSIS — E1122 Type 2 diabetes mellitus with diabetic chronic kidney disease: Secondary | ICD-10-CM | POA: Diagnosis not present

## 2016-01-11 DIAGNOSIS — F419 Anxiety disorder, unspecified: Secondary | ICD-10-CM | POA: Diagnosis not present

## 2016-01-11 DIAGNOSIS — I10 Essential (primary) hypertension: Secondary | ICD-10-CM | POA: Diagnosis present

## 2016-01-11 LAB — CBC WITH DIFFERENTIAL/PLATELET
BASOS ABS: 0.1 10*3/uL (ref 0.0–0.1)
BASOS PCT: 1 %
Eosinophils Absolute: 0.3 10*3/uL (ref 0.0–0.7)
Eosinophils Relative: 3 %
HEMATOCRIT: 40.2 % (ref 36.0–46.0)
HEMOGLOBIN: 12.1 g/dL (ref 12.0–15.0)
LYMPHS PCT: 17 %
Lymphs Abs: 1.8 10*3/uL (ref 0.7–4.0)
MCH: 28.3 pg (ref 26.0–34.0)
MCHC: 30.1 g/dL (ref 30.0–36.0)
MCV: 93.9 fL (ref 78.0–100.0)
MONO ABS: 1.5 10*3/uL — AB (ref 0.1–1.0)
Monocytes Relative: 14 %
NEUTROS ABS: 6.7 10*3/uL (ref 1.7–7.7)
NEUTROS PCT: 65 %
Platelets: 227 10*3/uL (ref 150–400)
RBC: 4.28 MIL/uL (ref 3.87–5.11)
RDW: 15.9 % — AB (ref 11.5–15.5)
WBC: 10.2 10*3/uL (ref 4.0–10.5)

## 2016-01-11 LAB — I-STAT ARTERIAL BLOOD GAS, ED
Acid-Base Excess: 4 mmol/L — ABNORMAL HIGH (ref 0.0–2.0)
BICARBONATE: 33.5 meq/L — AB (ref 20.0–24.0)
O2 Saturation: 100 %
PCO2 ART: 73.7 mmHg — AB (ref 35.0–45.0)
Patient temperature: 98.1
TCO2: 36 mmol/L (ref 0–100)
pH, Arterial: 7.264 — ABNORMAL LOW (ref 7.350–7.450)
pO2, Arterial: 282 mmHg — ABNORMAL HIGH (ref 80.0–100.0)

## 2016-01-11 LAB — COMPREHENSIVE METABOLIC PANEL
ALBUMIN: 3.4 g/dL — AB (ref 3.5–5.0)
ALT: 27 U/L (ref 14–54)
ANION GAP: 5 (ref 5–15)
AST: 24 U/L (ref 15–41)
Alkaline Phosphatase: 61 U/L (ref 38–126)
BILIRUBIN TOTAL: 0.3 mg/dL (ref 0.3–1.2)
BUN: 8 mg/dL (ref 6–20)
CO2: 29 mmol/L (ref 22–32)
Calcium: 8.6 mg/dL — ABNORMAL LOW (ref 8.9–10.3)
Chloride: 102 mmol/L (ref 101–111)
Creatinine, Ser: 0.77 mg/dL (ref 0.44–1.00)
GFR calc non Af Amer: >60 mL/min (ref >60)
GLUCOSE: 320 mg/dL — AB (ref 65–99)
POTASSIUM: 4.3 mmol/L (ref 3.5–5.1)
SODIUM: 136 mmol/L (ref 135–145)
TOTAL PROTEIN: 7.8 g/dL (ref 6.5–8.1)

## 2016-01-11 LAB — CBG MONITORING, ED
GLUCOSE-CAPILLARY: 223 mg/dL — AB (ref 65–99)
Glucose-Capillary: 283 mg/dL — ABNORMAL HIGH (ref 65–99)

## 2016-01-11 LAB — PROTIME-INR
INR: 1.78 — ABNORMAL HIGH (ref 0.00–1.49)
PROTHROMBIN TIME: 20.6 s — AB (ref 11.6–15.2)

## 2016-01-11 LAB — TROPONIN I: Troponin I: <0.03 ng/mL (ref <0.03)

## 2016-01-11 MED ORDER — POLYETHYLENE GLYCOL 3350 17 G PO PACK: 17.0000 g | PACK | Freq: Every day | ORAL | Status: DC | PRN

## 2016-01-11 MED ORDER — WARFARIN - PHARMACIST DOSING INPATIENT: Freq: Every day | Status: DC

## 2016-01-11 MED ORDER — CHOLESTYRAMINE 4 G PO PACK: 4.0000 g | PACK | Freq: Every day | ORAL | Status: DC | PRN

## 2016-01-11 MED ORDER — DULOXETINE HCL 30 MG PO CPEP
30.0000 mg | ORAL_CAPSULE | Freq: Two times a day (BID) | ORAL | Status: DC
  Administered 2016-01-11 – 2016-01-13 (×4): 30 mg via ORAL
  Filled 2016-01-11 (×4): qty 1

## 2016-01-11 MED ORDER — PROMETHAZINE HCL 25 MG PO TABS
25.0000 mg | ORAL_TABLET | ORAL | Status: DC | PRN
  Administered 2016-01-12 – 2016-01-13 (×4): 25 mg via ORAL
  Filled 2016-01-11 (×4): qty 1

## 2016-01-11 MED ORDER — ALBUTEROL SULFATE (2.5 MG/3ML) 0.083% IN NEBU: 2.5000 mg | INHALATION_SOLUTION | RESPIRATORY_TRACT | Status: DC | PRN

## 2016-01-11 MED ORDER — NEBIVOLOL HCL 10 MG PO TABS
10.0000 mg | ORAL_TABLET | Freq: Every day | ORAL | Status: DC
  Administered 2016-01-12 – 2016-01-13 (×2): 10 mg via ORAL
  Filled 2016-01-11 (×2): qty 2
  Filled 2016-01-11 (×2): qty 1

## 2016-01-11 MED ORDER — FENTANYL CITRATE (PF) 100 MCG/2ML IJ SOLN: 12.5000 ug | Freq: Once | INTRAMUSCULAR | Status: DC

## 2016-01-11 MED ORDER — FENTANYL CITRATE (PF) 100 MCG/2ML IJ SOLN
12.5000 ug | INTRAMUSCULAR | Status: DC | PRN
  Administered 2016-01-12: 12.5 ug via INTRAVENOUS
  Filled 2016-01-11: qty 2

## 2016-01-11 MED ORDER — SODIUM CHLORIDE 0.9 % IV SOLN
8.0000 mg | Freq: Once | INTRAVENOUS | Status: AC
  Administered 2016-01-11: 8 mg via INTRAVENOUS
  Filled 2016-01-11: qty 4

## 2016-01-11 MED ORDER — METOLAZONE 5 MG PO TABS
5.0000 mg | ORAL_TABLET | ORAL | Status: DC
  Administered 2016-01-13: 5 mg via ORAL
  Filled 2016-01-11: qty 1

## 2016-01-11 MED ORDER — BUDESONIDE 0.25 MG/2ML IN SUSP: 0.2500 mg | Freq: Two times a day (BID) | RESPIRATORY_TRACT | Status: DC | PRN

## 2016-01-11 MED ORDER — SODIUM CHLORIDE 0.9 % IV SOLN: 8.0000 mg | Freq: Four times a day (QID) | INTRAVENOUS | Status: DC | PRN

## 2016-01-11 MED ORDER — LORAZEPAM 2 MG/ML IJ SOLN
1.0000 mg | Freq: Once | INTRAMUSCULAR | Status: AC
  Administered 2016-01-11: 1 mg via INTRAVENOUS
  Filled 2016-01-11: qty 1

## 2016-01-11 MED ORDER — HYDROCODONE-ACETAMINOPHEN 5-325 MG PO TABS
1.0000 | ORAL_TABLET | Freq: Four times a day (QID) | ORAL | Status: DC | PRN
  Administered 2016-01-12 – 2016-01-13 (×3): 1 via ORAL
  Filled 2016-01-11 (×4): qty 1

## 2016-01-11 MED ORDER — INSULIN REGULAR HUMAN (CONC) 500 UNIT/ML ~~LOC~~ SOPN: 60.0000 [IU] | PEN_INJECTOR | Freq: Every day | SUBCUTANEOUS | Status: DC

## 2016-01-11 MED ORDER — LORAZEPAM 2 MG/ML IJ SOLN
1.0000 mg | Freq: Four times a day (QID) | INTRAMUSCULAR | Status: DC | PRN
  Administered 2016-01-11 – 2016-01-13 (×3): 1 mg via INTRAVENOUS
  Filled 2016-01-11 (×3): qty 1

## 2016-01-11 MED ORDER — ACETAMINOPHEN 500 MG PO TABS: 1000.0000 mg | ORAL_TABLET | Freq: Four times a day (QID) | ORAL | Status: DC | PRN

## 2016-01-11 MED ORDER — MECLIZINE HCL 12.5 MG PO TABS
12.5000 mg | ORAL_TABLET | Freq: Every day | ORAL | Status: DC
  Administered 2016-01-11 – 2016-01-12 (×2): 12.5 mg via ORAL
  Filled 2016-01-11 (×2): qty 1

## 2016-01-11 MED ORDER — GUAIFENESIN ER 600 MG PO TB12
600.0000 mg | ORAL_TABLET | Freq: Two times a day (BID) | ORAL | Status: DC
  Administered 2016-01-11 – 2016-01-13 (×4): 600 mg via ORAL
  Filled 2016-01-11 (×4): qty 1

## 2016-01-11 MED ORDER — INSULIN ASPART 100 UNIT/ML ~~LOC~~ SOLN
0.0000 [IU] | Freq: Three times a day (TID) | SUBCUTANEOUS | Status: DC
  Administered 2016-01-12: 4 [IU] via SUBCUTANEOUS
  Administered 2016-01-12: 11 [IU] via SUBCUTANEOUS
  Administered 2016-01-12: 4 [IU] via SUBCUTANEOUS
  Administered 2016-01-13: 11 [IU] via SUBCUTANEOUS
  Administered 2016-01-13: 15 [IU] via SUBCUTANEOUS

## 2016-01-11 MED ORDER — COLCHICINE 0.6 MG PO TABS
0.6000 mg | ORAL_TABLET | Freq: Every day | ORAL | Status: DC
  Administered 2016-01-11 – 2016-01-13 (×3): 0.6 mg via ORAL
  Filled 2016-01-11 (×3): qty 1

## 2016-01-11 MED ORDER — BISACODYL 5 MG PO TBEC: 5.0000 mg | DELAYED_RELEASE_TABLET | Freq: Every day | ORAL | Status: DC | PRN

## 2016-01-11 MED ORDER — PANTOPRAZOLE SODIUM 40 MG PO TBEC
80.0000 mg | DELAYED_RELEASE_TABLET | Freq: Every day | ORAL | Status: DC
  Administered 2016-01-11 – 2016-01-13 (×3): 80 mg via ORAL
  Filled 2016-01-11 (×3): qty 2

## 2016-01-11 MED ORDER — ZOLPIDEM TARTRATE 5 MG PO TABS: 5.0000 mg | ORAL_TABLET | Freq: Every evening | ORAL | Status: DC | PRN

## 2016-01-11 MED ORDER — FESOTERODINE FUMARATE ER 4 MG PO TB24
4.0000 mg | ORAL_TABLET | Freq: Every day | ORAL | Status: DC
  Administered 2016-01-12 – 2016-01-13 (×2): 4 mg via ORAL
  Filled 2016-01-11 (×2): qty 1

## 2016-01-11 MED ORDER — PHENAZOPYRIDINE HCL 100 MG PO TABS
95.0000 mg | ORAL_TABLET | Freq: Three times a day (TID) | ORAL | Status: DC
  Administered 2016-01-12: 100 mg via ORAL
  Filled 2016-01-11 (×2): qty 1

## 2016-01-11 MED ORDER — BUMETANIDE 2 MG PO TABS
2.0000 mg | ORAL_TABLET | Freq: Every day | ORAL | Status: DC
  Administered 2016-01-12 – 2016-01-13 (×2): 2 mg via ORAL
  Filled 2016-01-11 (×2): qty 1

## 2016-01-11 MED ORDER — BUSPIRONE HCL 15 MG PO TABS
7.5000 mg | ORAL_TABLET | Freq: Two times a day (BID) | ORAL | Status: DC
  Administered 2016-01-11 – 2016-01-13 (×4): 7.5 mg via ORAL
  Filled 2016-01-11 (×5): qty 1

## 2016-01-11 MED ORDER — DIPHENOXYLATE-ATROPINE 2.5-0.025 MG PO TABS: 1.0000 | ORAL_TABLET | Freq: Four times a day (QID) | ORAL | Status: DC | PRN

## 2016-01-11 MED ORDER — LORAZEPAM 1 MG PO TABS
1.0000 mg | ORAL_TABLET | Freq: Three times a day (TID) | ORAL | Status: DC | PRN
Start: 2016-01-11 — End: 2016-01-11

## 2016-01-11 MED ORDER — PRIMIDONE 50 MG PO TABS
50.0000 mg | ORAL_TABLET | Freq: Two times a day (BID) | ORAL | Status: DC
  Administered 2016-01-11 – 2016-01-13 (×4): 50 mg via ORAL
  Filled 2016-01-11 (×5): qty 1

## 2016-01-11 MED ORDER — FEBUXOSTAT 40 MG PO TABS
80.0000 mg | ORAL_TABLET | Freq: Every day | ORAL | Status: DC
  Administered 2016-01-11 – 2016-01-12 (×2): 80 mg via ORAL
  Filled 2016-01-11 (×3): qty 2

## 2016-01-11 MED ORDER — POTASSIUM CHLORIDE CRYS ER 20 MEQ PO TBCR
20.0000 meq | EXTENDED_RELEASE_TABLET | Freq: Four times a day (QID) | ORAL | Status: DC
  Administered 2016-01-11: 40 meq via ORAL
  Filled 2016-01-11: qty 2

## 2016-01-11 MED ORDER — WARFARIN SODIUM 5 MG PO TABS
5.0000 mg | ORAL_TABLET | ORAL | Status: DC
  Filled 2016-01-11: qty 1

## 2016-01-11 MED ORDER — IPRATROPIUM BROMIDE 0.02 % IN SOLN: 0.5000 mg | Freq: Four times a day (QID) | RESPIRATORY_TRACT | Status: DC | PRN

## 2016-01-11 MED ORDER — GABAPENTIN 300 MG PO CAPS
300.0000 mg | ORAL_CAPSULE | Freq: Two times a day (BID) | ORAL | Status: DC
  Administered 2016-01-11 – 2016-01-13 (×4): 300 mg via ORAL
  Filled 2016-01-11 (×4): qty 1

## 2016-01-11 MED ORDER — TRAZODONE HCL 50 MG PO TABS: 25.0000 mg | ORAL_TABLET | Freq: Every evening | ORAL | Status: DC | PRN

## 2016-01-11 MED ORDER — LOSARTAN POTASSIUM 50 MG PO TABS
100.0000 mg | ORAL_TABLET | Freq: Every day | ORAL | Status: DC
  Administered 2016-01-12 – 2016-01-13 (×2): 100 mg via ORAL
  Filled 2016-01-11 (×2): qty 2

## 2016-01-11 MED ORDER — ATORVASTATIN CALCIUM 10 MG PO TABS: 10.0000 mg | ORAL_TABLET | Freq: Every day | ORAL | Status: DC

## 2016-01-11 NOTE — Progress Notes (Signed)
Patient was brought in via EMS due to difficulty breathing.  Upon arrival patient was being bagged (has home ventilator, unsure of vent settings) and had nebulizer treatment running.  Patient noted to have a #6 Shiley XLT trach uncuffed.  Patient was difficult to bag on arrival.  Checked for color change on end-tital and was positive for color change on arrival.  Attempted to suction patient however suction catheter would not pass.  Pulled out inner cannula and noticed that inner cannula was kinked.  Once inner cannula was changed patient was able to bag easier.  Changed patient trach, with another RT and MD present, to a #6 Shiley XLT cuffed trach and placed on ventilator, on settings she was on the last time she was in the hospital.  Positive color change noted along with bilateral breath sounds.  Will get another follow up xray.  Will continue to monitor.

## 2016-01-11 NOTE — ED Notes (Addendum)
Caregiver- Theadore Nan [phone] -- please call to inform of room assignment

## 2016-01-11 NOTE — ED Notes (Signed)
Pt up to the bedside commode  foir a bm   bari bed at bedside not working  Had to get another bed.

## 2016-01-11 NOTE — ED Notes (Addendum)
To ED via [place] from home with c/o difficulty breathing --  On home vent -- CAREGIVER at home unaware of home vent settings. Pt 's vent was alarming, no one knew how long it had been alarming.  Pt was 80% on room air. When being bagged -- sats increased to 99-100%.  CBG = 371.

## 2016-01-11 NOTE — ED Notes (Signed)
Holding PO meds d/t nausea

## 2016-01-11 NOTE — Progress Notes (Signed)
No charge note  Met with patient and her private aid. She indicates that she would like to be comfortable and not suffer if her condition continues to decline.  Her anxiety level is high and she is nauseated.  PRN medications were added to the chart for comfort.  She is familiar with [facility].  I let her know she was eligible and asked her if she would like to go there.  Communication was difficult but with the help of her aid, she told me to call her cousin [name].  I spoke with [name] on speaker phone with [place] present.   [name] supported her decision to go to [facility] for comfort measures and end of life care.  Will request a [facility] eval.  In the mean time recommend continued treatment with comfort meds available PRN for dyspnea, anxiety, pain,nausea.  Patient is a full DNR/DNI.  No ACLS.  Imogene Burn, [facility] Pager: [phone]

## 2016-01-11 NOTE — ED Notes (Signed)
Pt placed in the bariatric bed

## 2016-01-11 NOTE — ED Provider Notes (Addendum)
CSN: [id number]     Arrival date & time 01/11/16  1329 History   First MD Initiated Contact with Patient 01/11/16 1337     Chief Complaint  Patient presents with  . Respiratory Distress   Pt is a 55 yo wf with a hx of resp failure requiring a home vent.  The pt is morbidly obese and has diffuse pulmonary mets.  EMS said that pt's ventilator alarm was going off and would not stop.  The person who is her caregiver could not tell EMS anything about settings of the vent.  EMS were unable to bag her trach, so put her on a 100% nrb.  O2 sats were initially in the 80s when EMS arrived and they were up to the upper 90s by the time she arrived here.  (Consider location/radiation/quality/duration/timing/severity/associated sxs/prior Treatment) The history is provided by the EMS personnel.    Past Medical History  Diagnosis Date  . Morbid obesity ([place])   . Depressive disorder, not elsewhere classified   . Asthma   . Dyslipidemia   . Clostridium difficile enterocolitis 2012  . Splenomegaly 06/01/2011  . Thrombocytopenia ([place]) 06/01/2011  . Hepatosplenomegaly   . Gout   . GERD (gastroesophageal reflux disease)   . Osteoarthritis   . Polyneuropathy ([place])   . Urinary incontinence   . Amenorrhea   . CHF (congestive heart failure) ([place])   . Hyperlipidemia   . Leg swelling   . Nausea   . Weakness   . Palpitations   . Anginal pain ([place])     no chest pain in years  . Dysrhythmia     heart palpations  . Anxiety   . Pneumonia   . Shortness of breath   . Chronic kidney disease     overactive bladder  . Peripheral neuropathy ([place])   . DVT (deep venous thrombosis) ([place])   . OSA (obstructive sleep apnea)     uses ventilator ( has trach)  . Diabetes mellitus     type 2  . Irritable bowel syndrome   . PONV (postoperative nausea and vomiting)     just nausea  . Endometrial cancer ([place]) dx'd 01/19/15   Past Surgical History  Procedure Laterality Date  . Cholecystectomy    . Tracheostomy   2001  . Endometrial ablation    . Skin graft    . Finger surgery      ring finger on right hand  . Esophageal dilation  2003  . Breast biopsy      needle core, left  . Dilation and curettage of uterus      times 2  . Tracheal dilitation  01/24/2012    Procedure: TRACHEAL DILITATION;  Surgeon: Melissa Montane, MD;  Location: [facility];  Service: ENT;  Laterality: N/A;  Trache change with possible dilation, from size 6 to size 7 uncuffed  . Amputation Right 11/19/2013    Procedure: AMPUTATION DIGIT;  Surgeon: Cammie Sickle, MD;  Location: [facility];  Service: Orthopedics;  Laterality: Right;  Partial amputation right long finger, Debridement right ring finger   . Amputation Right 06/03/2014    Procedure: REVISION AMPUTATION RIGHT RING FINGER;  Surgeon: Daryll Brod, MD;  Location: [facility];  Service: Orthopedics;  Laterality: Right;  . Dilatation & currettage/hysteroscopy with resectocope N/A 01/17/2015    Procedure: DILATATION & CURETTAGE/HYSTEROSCOPY WITH RESECTOCOPE; Pap Smear;  Surgeon: Ena Dawley, MD;  Location: [facility];  Service: Gynecology;  Laterality: N/A;  . Intrauterine  device (iud) insertion N/A 01/17/2015    Procedure: INTRAUTERINE DEVICE (IUD) INSERTION;  Surgeon: Ena Dawley, MD;  Location: [facility];  Service: Gynecology;  Laterality: N/A;  . Finger surgery Bilateral 06/16/15    I & D of multiple fingers  . Incision and drainage Left 06/16/2015    Procedure: IRRIGATION AND DEBRIDEMENT OF LEFT RING FINGER AND LEFT SMALL FINGER.;  Surgeon: Daryll Brod, MD;  Location: [facility];  Service: Orthopedics;  Laterality: Left;  . Dressing change under anesthesia Right 06/16/2015    Procedure: DRESSING CHANGE UNDER ANESTHESIA TO RIGHT THUMB AND RIGHT FIRST FINGER.;  Surgeon: Daryll Brod, MD;  Location: [facility];  Service: Orthopedics;  Laterality: Right;   Family History  Problem Relation Age of Onset  . Diabetes Father   . Hypertension Father   . Dementia Father      Deceased, 45  . Pneumonia Father   . Heart disease Mother   . Clotting disorder Mother   . Hypertension Mother   . Heart attack Mother     Deceased, 58  . Multiple myeloma Paternal Grandmother   . Cancer Paternal Grandmother     multiple myoloma  . Heart disease Paternal Grandfather   . Kidney disease Maternal Grandmother   . Hypertension Sister   . Cancer Paternal Aunt     breast  . Cancer Cousin     breast - all three   Social History  Substance Use Topics  . Smoking status: Never Smoker   . Smokeless tobacco: Never Used  . Alcohol Use: No     Comment: occasional - once or twice a year   OB History    Gravida Para Term Preterm AB TAB SAB Ectopic Multiple Living   0         0     Review of Systems  Unable to perform ROS: Severe respiratory distress      Allergies  Codeine; Molds & smuts; Allopurinol; Ciprofloxacin; Doxycycline; Fexofenadine; Keflex; Nitroglycerin er; Oritavancin; Sulfa drugs cross reactors; Accolate; Amoxicillin-pot clavulanate; Morphine; Nystatin; and Septra  Home Medications   Prior to Admission medications   Medication Sig Start Date End Date Taking? Authorizing Provider  acetaminophen (TYLENOL) 500 MG tablet Take 1,000 mg by mouth every 6 (six) hours as needed for mild pain. For pain    Historical Provider, MD  albuterol (PROVENTIL) (2.5 MG/3ML) 0.083% nebulizer solution Take 2.5 mg by nebulization every 4 (four) hours as needed for wheezing or shortness of breath.  05/28/11   Chesley Mires, MD  atorvastatin (LIPITOR) 10 MG tablet Take 10 mg by mouth at bedtime.     Historical Provider, MD  budesonide (PULMICORT) 0.25 MG/2ML nebulizer solution Take 2 mLs (0.25 mg total) by nebulization 2 (two) times daily as needed (shortness of breath). 01/09/16   Praveen Mannam, MD  bumetanide (BUMEX) 2 MG tablet TAKE 1 TABLET (2 MG TOTAL) BY MOUTH DAILY. 09/14/15   Jettie Booze, MD  busPIRone (BUSPAR) 5 MG tablet Take 7.5 mg by mouth 2 (two) times daily.      Historical Provider, MD  cholestyramine Lucrezia Starch) 4 G packet Take 4 g by mouth daily as needed (loose stools/ IBS).     Historical Provider, MD  clotrimazole (LOTRIMIN) 1 % cream Apply 1 application topically daily as needed (rash). To stomach    Historical Provider, MD  colchicine 0.6 MG tablet Take 0.6 mg by mouth daily.    Historical Provider, MD  diphenoxylate-atropine (LOMOTIL) 2.5-0.025 MG tablet Take 1-2 tablets  by mouth 4 (four) times daily as needed for diarrhea or loose stools. 07/18/15   Eppie Gibson, MD  DULoxetine (CYMBALTA) 30 MG capsule Take 1 capsule (30 mg total) by mouth 2 (two) times daily. 09/16/15   Alda Berthold, DO  ergocalciferol (VITAMIN D2) 50000 UNITS capsule Take 50,000 Units by mouth every Friday. Take on Friday    Historical Provider, MD  Febuxostat (ULORIC) 80 MG TABS Take 80 mg by mouth at bedtime.     Historical Provider, MD  fesoterodine (TOVIAZ) 4 MG TB24 Take 4 mg by mouth daily.      Historical Provider, MD  gabapentin (NEURONTIN) 300 MG capsule Take 1 capsule (300 mg total) by mouth 2 (two) times daily. 09/16/15   Donika K Patel, DO  guaiFENesin (MUCINEX) 600 MG 12 hr tablet Take 600 mg by mouth 2 (two) times daily.     Historical Provider, MD  HUMULIN R U-500 KWIKPEN 500 UNIT/ML kwikpen Inject 110 Units into the skin daily with breakfast. Inject 110 units in am, 60 units with lunch and 60 units with supper Patient taking differently: Inject 80-120 Units into the skin 3 (three) times daily with meals. Inject 120 units subcutaneously daily before breakfast, 80 units before lunch and 80 units before supper 08/19/15   Nishant Dhungel, MD  HYDROcodone-acetaminophen (NORCO) 5-325 MG tablet Take 1 tablet by mouth every 6 (six) hours as needed for moderate pain. 06/16/15   Daryll Brod, MD  ipratropium (ATROVENT) 0.02 % nebulizer solution Take 2.5 mLs (0.5 mg total) by nebulization 4 (four) times daily as needed for wheezing or shortness of breath. 01/09/16   Praveen  Mannam, MD  ketoconazole (NIZORAL) 2 % cream Apply 1 application topically daily. Patient taking differently: Apply 1 application topically daily as needed (rash).  04/13/15   Donnel Saxon, CNM  Liraglutide (VICTOZA) 18 MG/3ML SOPN Inject 1.8 mg into the skin daily at 12 noon.    Historical Provider, MD  LORazepam (ATIVAN) 0.5 MG tablet Take 1 tablet by mouth 30 minutes before radiotherapy, PRN nausea 07/11/15   Eppie Gibson, MD  LORazepam (ATIVAN) 1 MG tablet Take 1 mg by mouth 3 (three) times daily as needed for anxiety.    Historical Provider, MD  losartan (COZAAR) 100 MG tablet TAKE 1 TABLET (100MG) BY MOUTH DAILY. Patient taking differently: Take 100 mg by mouth daily.  12/20/15   Jettie Booze, MD  meclizine (ANTIVERT) 12.5 MG tablet Take 12.5 mg by mouth at bedtime.  12/05/14   Historical Provider, MD  megestrol (MEGACE) 40 MG tablet TAKE 2 TABLETS (80 MG TOTAL) BY MOUTH 3 (THREE) TIMES DAILY. 12/13/15   Melissa D Cross, NP  metolazone (ZAROXOLYN) 5 MG tablet TAKE 1 TABLET (5 MG TOTAL) BY MOUTH SEE ADMIN INSTRUCTIONS. 3 TIMES WEEKLY AS NEEDED FOR EDEMA 09/13/14   Jettie Booze, MD  Multiple Vitamin (MULTIVITAMIN WITH MINERALS) TABS tablet Take 1 tablet by mouth daily.    Historical Provider, MD  nebivolol (BYSTOLIC) 10 MG tablet TAKE 1 TABLET (10 MG TOTAL) BY MOUTH DAILY. 01/10/16   Jettie Booze, MD  Omega-3 Fatty Acids (OMEGA-3 FISH OIL) 1200 MG CAPS Take 2 capsules (2,400 mg total) by mouth 2 (two) times daily. 09/07/14   Jettie Booze, MD  omeprazole (PRILOSEC) 40 MG capsule Take 40 mg by mouth 2 (two) times daily.     Historical Provider, MD  OXYGEN Inhale 4 L into the lungs continuous.     Historical Provider, MD  phenazopyridine (  PYRIDIUM) 95 MG tablet Take 1 tablet (95 mg total) by mouth 3 (three) times daily as needed for pain. 08/05/15   Melissa D Cross, NP  potassium chloride SA (K-DUR,KLOR-CON) 20 MEQ tablet Take 20-40 mEq by mouth 4 (four) times daily. Take 1  tablet (20 meq) by mouth every morning and at bedtime, take 2 tablets (40 meq) with lunch and supper    Historical Provider, MD  primidone (MYSOLINE) 50 MG tablet Take 1 tablet (50 mg total) by mouth 2 (two) times daily. 09/16/15   Alda Berthold, DO  Probiotic Product (PROBIOTIC PO) Take 1 tablet by mouth daily.     Historical Provider, MD  promethazine (PHENERGAN) 25 MG tablet Take 1 tablet (25 mg total) by mouth every 4 (four) hours as needed for nausea or vomiting. 07/13/15   Campbell Riches, MD  warfarin (COUMADIN) 5 MG tablet Take 2.5-5 mg by mouth daily after supper. Take 1 tablet (5 mg) by mouth on Saturday and Sunday, take 1/2 tablet (2.5 mg) Monday thru Friday    Historical Provider, MD  zolpidem (AMBIEN) 5 MG tablet Take 1 tablet (5 mg total) by mouth at bedtime as needed for sleep. 01/09/16   Praveen Mannam, MD   BP 194/97 mmHg  Pulse 91  Temp(Src) 98.1 F (36.7 C) (Axillary)  Resp 23  SpO2 100% Physical Exam  Constitutional: She appears well-developed and well-nourished.  Morbidly obese  HENT:  Head: Normocephalic and atraumatic.  Right Ear: External ear normal.  Left Ear: External ear normal.  Nose: Nose normal.  Mouth/Throat: Oropharynx is clear and moist.  Eyes: Conjunctivae and EOM are normal. Pupils are equal, round, and reactive to light.  Neck:    Cardiovascular: Normal rate, regular rhythm, normal heart sounds and intact distal pulses.   Pulmonary/Chest: Accessory muscle usage present. Tachypnea noted. She is in respiratory distress. She has decreased breath sounds.  Abdominal: Soft. Bowel sounds are normal.  Musculoskeletal: Normal range of motion.  Neurological: She is alert.  Skin: Skin is warm and dry.  Psychiatric: She has a normal mood and affect. Her behavior is normal. Judgment and thought content normal.  Nursing note and vitals reviewed.   ED Course  .Critical Care Performed by: Isla Pence Authorized by: Isla Pence Total critical care  time: 30 minutes Critical care was necessary to treat or prevent imminent or life-threatening deterioration of the following conditions: respiratory failure. Critical care was time spent personally by me on the following activities: development of treatment plan with patient or surrogate, evaluation of patient's response to treatment, examination of patient, obtaining history from patient or surrogate, ordering and performing treatments and interventions, ordering and review of laboratory studies, ordering and review of radiographic studies, pulse oximetry, re-evaluation of patient's condition, ventilator management and review of old charts.   (including critical care time) Labs Review Labs Reviewed  COMPREHENSIVE METABOLIC PANEL - Abnormal; Notable for the following:    Glucose, Bld 320 (*)    Calcium 8.6 (*)    Albumin 3.4 (*)    All other components within normal limits  CBC WITH DIFFERENTIAL/PLATELET - Abnormal; Notable for the following:    RDW 15.9 (*)    Monocytes Absolute 1.5 (*)    All other components within normal limits  I-STAT ARTERIAL BLOOD GAS, ED - Abnormal; Notable for the following:    pH, Arterial 7.264 (*)    pCO2 arterial 73.7 (*)    pO2, Arterial 282.0 (*)    Bicarbonate 33.5 (*)  Acid-Base Excess 4.0 (*)    All other components within normal limits  TROPONIN I  URINALYSIS, ROUTINE W REFLEX MICROSCOPIC (NOT AT [facility])  PROTIME-INR    Imaging Review Dg Chest Port 1 View  01/11/2016  CLINICAL DATA:  Short of breath.  Asthma.  CHF and pneumonia. EXAM: PORTABLE CHEST 1 VIEW COMPARISON:  01/07/2016.  CT of 11/29/2015. FINDINGS: Mildly degraded exam due to AP portable technique and patient body habitus. Patient rotated to the left. Tracheostomy is appropriately positioned. Cardiomegaly accentuated by AP portable technique. Limited evaluation for pleural fluid. No pneumothorax. Relatively diffuse pulmonary metastasis again identified. Lung bases not well evaluated.  IMPRESSION: Diffuse pulmonary metastatic disease. Otherwise, limited exam. Cannot exclude pleural fluid and/or bibasilar airspace disease. Electronically Signed   By: Abigail Miyamoto [name]   On: 01/11/2016 14:14   I have personally reviewed and evaluated these images and lab results as part of my medical decision-making.   EKG Interpretation   Date/Time:  Wednesday January 11 2016 13:53:30 EDT Ventricular Rate:  93 PR Interval:    QRS Duration: 117 QT Interval:  360 QTC Calculation: 448 R Axis:   34 Text Interpretation:  Sinus rhythm Nonspecific intraventricular conduction  delay Low voltage, precordial leads Confirmed by Sofi Bryars MD, Cadience Bradfield  ([id number]) on 01/11/2016 2:35:07 PM      MDM  Pt very difficult to bag, so RT attempted to suction trach, but was unable to pass the suction cath.  She pulled out the inner cannula and it was kinked.  She was able to change the inner cannula without difficulty, but she was unable to get the appropriate volumes on the vent.  Pt's trach does not have a balloon, so we changed the trach to a balloon trach.   Much better volumes after change.  Pt looks much more comfortable.    Pt d/w Dr. Oletta Darter ([place]) and he will put pt on the admission list.  Pt was seen by Salvadore Dom, Critical Care NP, who felt like pt could go to step down with admission to the hospitalists.  They will do the vent management.  They also spoke with pt and made her a DNR.  Pt d/w Janne Napoleon (mid-level) who will admit.  Final diagnoses:  Obesity hypoventilation syndrome ([place])  Ventilator dependence ([place])  Morbid obesity with BMI of 70 and over, adult [facility])  Malignant neoplasm metastatic to lung, unspecified laterality [facility])       Isla Pence, MD 01/11/16 1546  Isla Pence, MD 01/12/16 [phone]

## 2016-01-11 NOTE — ED Notes (Signed)
Pt placed on [facility] by 6 staff members. Pt placed on bariatric bed.

## 2016-01-11 NOTE — Consult Note (Signed)
Consultation Note Date: 01/11/2016   Patient Name: [name]  DOB: 1961/03/14  MRN: [id number]  Age / Sex: 55 y.o., female  PCP: Kelton Pillar, MD Referring Physician: Waldemar Dickens, MD  Reason for Consultation: Goals of Care  HPI/Patient Profile: 55 y.o. female  From home with past medical history of OSA with hypoventilation syndrome s/p tracheostomy placement in 2001, VDRF, DM, CAD, and stage 4 endometrial cancer with metastases to the lungs.  She was  admitted on 01/11/2016 with acute on chronic hypoxic respiratory failure.  Upon exam by pulmonology she was found to have a "kinked" inner cannula which was quickly remedied by respiratory.  She has also had a great deal of mucus suctioned.  PCCM recommended that she be admitted by the Hospitalists overnight for observation.  Upon further evaluation it was realized that the patients lung metastases have been gradually becoming worse, she is ventilator dependent and she has no further options for chemotherapy or radiation.  The patient has been having increasing difficulty with episodes of SOB and severe anxiety.  Over the past month she has been completely ventilator dependent.  She has been having increasing amounts of bloody discharge from her trach.  For a more detailed history of the patient's endometrial cancer, please see Dr. Mariana Kaufman excellent office note from 01/06/16.  Clinical Assessment and Goals of Care: I met in the ER with the patient and her private care taker Sharita.  We also added on her cousin [name] by speaker phone.  Initially we just discussed symptom management.  Later outside the room Sharita told me the patient was considering [facility]. Sharita and I entered the room once again and I asked the patient about end of life and comfort care at [facility].  With [name] on the phone we discussed [facility] as a 24 hour nursing facility that  specializes in comfort care at end of life.  I explained to Ms. Polzin and [name] that patients are not expected to leave [facility] and the normal life expectancy is less than 2 weeks.  Ms. Twiford is interested in going to [facility] (although she is very sad).  Her cousin [name] supports this decision.  The patient is her own decision maker    SUMMARY OF RECOMMENDATIONS   DNR/ DNI Will admit to obs and treat for now.  Re-evaluation medications in the morning. Request [facility] evaluation.  Patient is from home with a private 24 hour care giver.  Comfort medications were placed on order PRN.   Symptom Management:   Nausea:  Zofran  Dyspnea:  Fentanyl (patient has a bad reaction to morphine - SVT)  Anxiety:  Ativan   Palliative Prophylaxis:   Frequent assessment for anxiety / dyspnea    Psycho-social/Spiritual:   Desire for further Chaplaincy support: Yes - ordered  Additional Recommendations: Caregiver support  Prognosis: less than two weeks.  Patient is ventilator dependent she has significant lung mets from stage 4 endometrial cancer and possible PNA  Discharge Planning: [facility]  vs Home with Hospice Services and continued private care giver      Primary Diagnoses: Present on Admission:  . Acute respiratory failure ([place]) . Chronic respiratory failure ([place]) . Endometrial cancer ([place]) . Metastatic adenocarcinoma to lung ([place]) . HTN (hypertension), benign . Mixed hyperlipidemia . Obesity (BMI 35.0-39.9 without comorbidity) ([place]) . Morbid obesity ([place])  I have reviewed the medical record, interviewed the patient and family, and examined the patient. The following aspects are pertinent.  Past Medical History  Diagnosis Date  . Morbid obesity ([place])   . Depressive disorder, not elsewhere classified   . Asthma   . Dyslipidemia   . Clostridium difficile enterocolitis 2012  . Splenomegaly 06/01/2011  . Thrombocytopenia ([place]) 06/01/2011  .  Hepatosplenomegaly   . Gout   . GERD (gastroesophageal reflux disease)   . Osteoarthritis   . Polyneuropathy ([place])   . Urinary incontinence   . Amenorrhea   . CHF (congestive heart failure) ([place])   . Hyperlipidemia   . Leg swelling   . Nausea   . Weakness   . Palpitations   . Anginal pain ([place])     no chest pain in years  . Dysrhythmia     heart palpations  . Anxiety   . Pneumonia   . Shortness of breath   . Chronic kidney disease     overactive bladder  . Peripheral neuropathy ([place])   . DVT (deep venous thrombosis) ([place])   . OSA (obstructive sleep apnea)     uses ventilator ( has trach)  . Diabetes mellitus     type 2  . Irritable bowel syndrome   . PONV (postoperative nausea and vomiting)     just nausea  . Endometrial cancer ([place]) dx'd 01/19/15   Social History   Social History  . Marital Status: Single    Spouse Name: N/A  . Number of Children: 0  . Years of Education: N/A   Occupational History  . disabled    Social History Main Topics  . Smoking status: Never Smoker   . Smokeless tobacco: Never Used  . Alcohol Use: No     Comment: occasional - once or twice a year  . Drug Use: No  . Sexual Activity: Not Currently    Birth Control/ Protection: IUD   Other Topics Concern  . None   Social History Narrative   Lives alone.  She has been on disability since 1999 for morbid obesity.    Single, no children.   She has a caregiver that comes 7-days a week, ~5 hours each day.   Family History  Problem Relation Age of Onset  . Diabetes Father   . Hypertension Father   . Dementia Father     Deceased, 62  . Pneumonia Father   . Heart disease Mother   . Clotting disorder Mother   . Hypertension Mother   . Heart attack Mother     Deceased, 70  . Multiple myeloma Paternal Grandmother   . Cancer Paternal Grandmother     multiple myoloma  . Heart disease Paternal Grandfather   . Kidney disease Maternal Grandmother   . Hypertension Sister   . Cancer  Paternal Aunt     breast  . Cancer Cousin     breast - all three   Scheduled Meds: . bumetanide  2 mg Oral Daily  . busPIRone  7.5 mg Oral BID  . colchicine  0.6 mg Oral Daily  . DULoxetine  30 mg Oral BID  .  Febuxostat  80 mg Oral QHS  . fentaNYL (SUBLIMAZE) injection  12.5 mcg Intravenous Once  . fesoterodine  4 mg Oral Daily  . gabapentin  300 mg Oral BID  . guaiFENesin  600 mg Oral BID  . [START ON 01/12/2016] insulin aspart  0-20 Units Subcutaneous TID WC  . insulin regular human CONCENTRATED  60 Units Subcutaneous Q breakfast  . losartan  100 mg Oral Daily  . meclizine  12.5 mg Oral QHS  . metolazone  5 mg Oral Once per day on Mon Wed Fri  . nebivolol  10 mg Oral Daily  . ondansetron (ZOFRAN) IV  8 mg Intravenous Once  . pantoprazole  80 mg Oral Daily  . [START ON 01/12/2016] phenazopyridine  100 mg Oral TID WC  . potassium chloride SA  20-40 mEq Oral QID  . primidone  50 mg Oral BID   Continuous Infusions:  PRN Meds:.acetaminophen, albuterol, bisacodyl, budesonide, cholestyramine, diphenoxylate-atropine, fentaNYL (SUBLIMAZE) injection, HYDROcodone-acetaminophen, ipratropium, LORazepam, ondansetron (ZOFRAN) IV, polyethylene glycol, promethazine, traZODone Medications Prior to Admission:  Prior to Admission medications   Medication Sig Start Date End Date Taking? Authorizing Provider  acetaminophen (TYLENOL) 500 MG tablet Take 1,000 mg by mouth every 6 (six) hours as needed for mild pain. For pain    Historical Provider, MD  albuterol (PROVENTIL) (2.5 MG/3ML) 0.083% nebulizer solution Take 2.5 mg by nebulization every 4 (four) hours as needed for wheezing or shortness of breath.  05/28/11   Chesley Mires, MD  atorvastatin (LIPITOR) 10 MG tablet Take 10 mg by mouth at bedtime.     Historical Provider, MD  budesonide (PULMICORT) 0.25 MG/2ML nebulizer solution Take 2 mLs (0.25 mg total) by nebulization 2 (two) times daily as needed (shortness of breath). 01/09/16   Praveen Mannam, MD    bumetanide (BUMEX) 2 MG tablet TAKE 1 TABLET (2 MG TOTAL) BY MOUTH DAILY. 09/14/15   Jettie Booze, MD  busPIRone (BUSPAR) 5 MG tablet Take 7.5 mg by mouth 2 (two) times daily.     Historical Provider, MD  cholestyramine Lucrezia Starch) 4 G packet Take 4 g by mouth daily as needed (loose stools/ IBS).     Historical Provider, MD  clotrimazole (LOTRIMIN) 1 % cream Apply 1 application topically daily as needed (rash). To stomach    Historical Provider, MD  colchicine 0.6 MG tablet Take 0.6 mg by mouth daily.    Historical Provider, MD  diphenoxylate-atropine (LOMOTIL) 2.5-0.025 MG tablet Take 1-2 tablets by mouth 4 (four) times daily as needed for diarrhea or loose stools. 07/18/15   Eppie Gibson, MD  DULoxetine (CYMBALTA) 30 MG capsule Take 1 capsule (30 mg total) by mouth 2 (two) times daily. 09/16/15   Alda Berthold, DO  ergocalciferol (VITAMIN D2) 50000 UNITS capsule Take 50,000 Units by mouth every Friday. Take on Friday    Historical Provider, MD  Febuxostat (ULORIC) 80 MG TABS Take 80 mg by mouth at bedtime.     Historical Provider, MD  fesoterodine (TOVIAZ) 4 MG TB24 Take 4 mg by mouth daily.      Historical Provider, MD  gabapentin (NEURONTIN) 300 MG capsule Take 1 capsule (300 mg total) by mouth 2 (two) times daily. 09/16/15   Donika K Patel, DO  guaiFENesin (MUCINEX) 600 MG 12 hr tablet Take 600 mg by mouth 2 (two) times daily.     Historical Provider, MD  HUMULIN R U-500 KWIKPEN 500 UNIT/ML kwikpen Inject 110 Units into the skin daily with breakfast. Inject 110 units  in am, 60 units with lunch and 60 units with supper Patient taking differently: Inject 80-120 Units into the skin 3 (three) times daily with meals. Inject 120 units subcutaneously daily before breakfast, 80 units before lunch and 80 units before supper 08/19/15   Nishant Dhungel, MD  HYDROcodone-acetaminophen (NORCO) 5-325 MG tablet Take 1 tablet by mouth every 6 (six) hours as needed for moderate pain. 06/16/15   Daryll Brod, MD   ipratropium (ATROVENT) 0.02 % nebulizer solution Take 2.5 mLs (0.5 mg total) by nebulization 4 (four) times daily as needed for wheezing or shortness of breath. 01/09/16   Praveen Mannam, MD  ketoconazole (NIZORAL) 2 % cream Apply 1 application topically daily. Patient taking differently: Apply 1 application topically daily as needed (rash).  04/13/15   Donnel Saxon, CNM  Liraglutide (VICTOZA) 18 MG/3ML SOPN Inject 1.8 mg into the skin daily at 12 noon.    Historical Provider, MD  LORazepam (ATIVAN) 0.5 MG tablet Take 1 tablet by mouth 30 minutes before radiotherapy, PRN nausea 07/11/15   Eppie Gibson, MD  LORazepam (ATIVAN) 1 MG tablet Take 1 mg by mouth 3 (three) times daily as needed for anxiety.    Historical Provider, MD  losartan (COZAAR) 100 MG tablet TAKE 1 TABLET (100MG) BY MOUTH DAILY. Patient taking differently: Take 100 mg by mouth daily.  12/20/15   Jettie Booze, MD  meclizine (ANTIVERT) 12.5 MG tablet Take 12.5 mg by mouth at bedtime.  12/05/14   Historical Provider, MD  megestrol (MEGACE) 40 MG tablet TAKE 2 TABLETS (80 MG TOTAL) BY MOUTH 3 (THREE) TIMES DAILY. 12/13/15   Melissa D Cross, NP  metolazone (ZAROXOLYN) 5 MG tablet TAKE 1 TABLET (5 MG TOTAL) BY MOUTH SEE ADMIN INSTRUCTIONS. 3 TIMES WEEKLY AS NEEDED FOR EDEMA 09/13/14   Jettie Booze, MD  Multiple Vitamin (MULTIVITAMIN WITH MINERALS) TABS tablet Take 1 tablet by mouth daily.    Historical Provider, MD  nebivolol (BYSTOLIC) 10 MG tablet TAKE 1 TABLET (10 MG TOTAL) BY MOUTH DAILY. 01/10/16   Jettie Booze, MD  Omega-3 Fatty Acids (OMEGA-3 FISH OIL) 1200 MG CAPS Take 2 capsules (2,400 mg total) by mouth 2 (two) times daily. 09/07/14   Jettie Booze, MD  omeprazole (PRILOSEC) 40 MG capsule Take 40 mg by mouth 2 (two) times daily.     Historical Provider, MD  OXYGEN Inhale 4 L into the lungs continuous.     Historical Provider, MD  phenazopyridine (PYRIDIUM) 95 MG tablet Take 1 tablet (95 mg total) by mouth 3  (three) times daily as needed for pain. 08/05/15   Melissa D Cross, NP  potassium chloride SA (K-DUR,KLOR-CON) 20 MEQ tablet Take 20-40 mEq by mouth 4 (four) times daily. Take 1 tablet (20 meq) by mouth every morning and at bedtime, take 2 tablets (40 meq) with lunch and supper    Historical Provider, MD  primidone (MYSOLINE) 50 MG tablet Take 1 tablet (50 mg total) by mouth 2 (two) times daily. 09/16/15   Alda Berthold, DO  Probiotic Product (PROBIOTIC PO) Take 1 tablet by mouth daily.     Historical Provider, MD  promethazine (PHENERGAN) 25 MG tablet Take 1 tablet (25 mg total) by mouth every 4 (four) hours as needed for nausea or vomiting. 07/13/15   Campbell Riches, MD  warfarin (COUMADIN) 5 MG tablet Take 2.5-5 mg by mouth daily after supper. Take 1 tablet (5 mg) by mouth on Saturday and Sunday, take 1/2 tablet (2.5 mg)  Monday thru Friday    Historical Provider, MD  zolpidem (AMBIEN) 5 MG tablet Take 1 tablet (5 mg total) by mouth at bedtime as needed for sleep. 01/09/16   Marshell Garfinkel, MD   Allergies  Allergen Reactions  . Codeine Other (See Comments)    Heart problems  . Molds & Smuts Shortness Of Breath and Rash  . Allopurinol Other (See Comments)    GI problems   . Ciprofloxacin Other (See Comments)    GI problems  . Doxycycline Other (See Comments)    Gastric problems  . Fexofenadine Other (See Comments)    Hurts joints/pain  . Keflex [Cephalexin] Diarrhea and Nausea Only  . Nitroglycerin Er Nausea And Vomiting  . Oritavancin Itching    Mild-moderate itching.   . Sulfa Drugs Cross Reactors Nausea And Vomiting  . Accolate [Zafirlukast] Other (See Comments)     rash  . Amoxicillin-Pot Clavulanate Other (See Comments)     GI problems  . Morphine Other (See Comments)    Nausea and irregular HR(flutter)  . Nystatin Rash  . Septra [Sulfamethoxazole-Trimethoprim] Other (See Comments)    unknown   Review of Systems  Patient has a limited ability to speak but tells me she is  anxious, has nausea, but denies CP, Dysuria, bowel problems.  Physical Exam  Obese female, trach in place, appears anxious and uncomfortable, sitting on bedside commode Resp:  Trach, Vent dependent, with mildly increased work of breathing CV:  Tachycardic Abdomen:  Massive soft Extremities:  LE edema bilaterally 3+ with chronic wounds that appear to be healing  Vital Signs: BP 158/95 mmHg  Pulse 87  Temp(Src) 98.1 F (36.7 C) (Axillary)  Resp 24  SpO2 100%         SpO2: SpO2: 100 % O2 Device:SpO2: 100 % O2 Flow Rate: .   IO: Intake/output summary: No intake or output data in the 24 hours ending 01/11/16 1752  LBM:   Baseline Weight:   Most recent weight:       Palliative Assessment/Data:     Time In: 4:45 Time Out: 6:00 Time Total: 75 min. Greater than 50%  of this time was spent counseling and coordinating care related to the above assessment and plan.  Signed by: Imogene Burn, PA-C Palliative Medicine Pager: [phone]    Please contact Palliative Medicine Team phone at [phone] for questions and concerns.  For individual provider: See Shea Evans

## 2016-01-11 NOTE — ED Notes (Signed)
Critical care pa here to see the pt

## 2016-01-11 NOTE — Consult Note (Signed)
Name: [name] MRN: [id number] DOB: January 08, 1961    ADMISSION DATE:  01/11/2016 CONSULTATION DATE:  01/11/2016  REFERRING MD :  Gilford Raid / EDP  CHIEF COMPLAINT:  Hypoxia   SIGNIFICANT EVENTS  7/19 - admitted with hypoxia, trach appeared malpositioned > changed in ED with success and improvement in sats.  STUDIES:  Port CXR 7/19: Diffuse nodular opacities unchanged from prior imaging.   HISTORY OF PRESENT ILLNESS:  55 y.o. 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  Had CT guided bx of pulmonary nodules which confirmed metastatic adenocarcinoma.  Has been evaluated by Dr. Marko Plume and deemed to not be candidate for chemo due to her functional status.  Is now s/p tracheostomy with 24/7 vent dependence and 24 hour home assistance.  Presented to [facility] ED 7/19 with dyspnea and persistent vent alarm.  Trach (6 cuffless XLT) was evaluated and found to have kinked and "wrinkled" inner cannula.  Trach and inner cannula was changed (#6 shiley XLT cuffed) with immediate improvement in ease of bagging and ventilation.    Rest of workup essentially negative.  Pt to be admitted by [facility] to SDU for overnight observation.  Can likely d/c in AM.  We will obtain tracheal aspirate for completeness.  Extensive discussion between pt and [name] regarding her history, circumstances and poor prognosis. We also discussed patient's prior wishes under circumstances such as this. She has decided and agreed that she would not want to undergo resuscitation if arrest were to occur or to be put through heroic measures, but to otherwise continue with current medical support / therapies.  DNR status established.   PAST MEDICAL HISTORY:  Past Medical History  Diagnosis Date  . Morbid obesity ([place])   . Depressive disorder, not elsewhere classified   . Asthma   . Dyslipidemia   . Clostridium difficile enterocolitis 2012  . Splenomegaly 06/01/2011  . Thrombocytopenia ([place])  06/01/2011  . Hepatosplenomegaly   . Gout   . GERD (gastroesophageal reflux disease)   . Osteoarthritis   . Polyneuropathy ([place])   . Urinary incontinence   . Amenorrhea   . CHF (congestive heart failure) ([place])   . Hyperlipidemia   . Leg swelling   . Nausea   . Weakness   . Palpitations   . Anginal pain ([place])     no chest pain in years  . Dysrhythmia     heart palpations  . Anxiety   . Pneumonia   . Shortness of breath   . Chronic kidney disease     overactive bladder  . Peripheral neuropathy ([place])   . DVT (deep venous thrombosis) ([place])   . OSA (obstructive sleep apnea)     uses ventilator ( has trach)  . Diabetes mellitus     type 2  . Irritable bowel syndrome   . PONV (postoperative nausea and vomiting)     just nausea  . Endometrial cancer ([place]) dx'd 01/19/15   PAST SURGICAL HISTORY: Past Surgical History  Procedure Laterality Date  . Cholecystectomy    . Tracheostomy  2001  . Endometrial ablation    . Skin graft    . Finger surgery      ring finger on right hand  . Esophageal dilation  2003  . Breast biopsy      needle core, left  . Dilation and curettage of uterus      times 2  . Tracheal dilitation  01/24/2012    Procedure: TRACHEAL  DILITATION;  Surgeon: Melissa Montane, MD;  Location: [place];  Service: ENT;  Laterality: N/A;  Trache change with possible dilation, from size 6 to size 7 uncuffed  . Amputation Right 11/19/2013    Procedure: AMPUTATION DIGIT;  Surgeon: Cammie Sickle, MD;  Location: [place];  Service: Orthopedics;  Laterality: Right;  Partial amputation right long finger, Debridement right ring finger   . Amputation Right 06/03/2014    Procedure: REVISION AMPUTATION RIGHT RING FINGER;  Surgeon: Daryll Brod, MD;  Location: [place];  Service: Orthopedics;  Laterality: Right;  . Dilatation & currettage/hysteroscopy with resectocope N/A 01/17/2015    Procedure: DILATATION & CURETTAGE/HYSTEROSCOPY WITH RESECTOCOPE; Pap  Smear;  Surgeon: Ena Dawley, MD;  Location: [facility];  Service: Gynecology;  Laterality: N/A;  . Intrauterine device (iud) insertion N/A 01/17/2015    Procedure: INTRAUTERINE DEVICE (IUD) INSERTION;  Surgeon: Ena Dawley, MD;  Location: [place] [facility];  Service: Gynecology;  Laterality: N/A;  . Finger surgery Bilateral 06/16/15    I & D of multiple fingers  . Incision and drainage Left 06/16/2015    Procedure: IRRIGATION AND DEBRIDEMENT OF LEFT RING FINGER AND LEFT SMALL FINGER.;  Surgeon: Daryll Brod, MD;  Location: [place];  Service: Orthopedics;  Laterality: Left;  . Dressing change under anesthesia Right 06/16/2015    Procedure: DRESSING CHANGE UNDER ANESTHESIA TO RIGHT THUMB AND RIGHT FIRST FINGER.;  Surgeon: Daryll Brod, MD;  Location: [place];  Service: Orthopedics;  Laterality: Right;    Prior to Admission medications   Medication Sig Start Date End Date Taking? Authorizing Provider  acetaminophen (TYLENOL) 500 MG tablet Take 1,000 mg by mouth every 6 (six) hours as needed for mild pain. For pain    Historical Provider, MD  albuterol (PROVENTIL) (2.5 MG/3ML) 0.083% nebulizer solution Take 2.5 mg by nebulization every 4 (four) hours as needed for wheezing or shortness of breath.  05/28/11   Chesley Mires, MD  atorvastatin (LIPITOR) 10 MG tablet Take 10 mg by mouth at bedtime.     Historical Provider, MD  budesonide (PULMICORT) 0.25 MG/2ML nebulizer solution Take 2 mLs (0.25 mg total) by nebulization 2 (two) times daily as needed (shortness of breath). 01/09/16   Praveen Mannam, MD  bumetanide (BUMEX) 2 MG tablet TAKE 1 TABLET (2 MG TOTAL) BY MOUTH DAILY. 09/14/15   Jettie Booze, MD  busPIRone (BUSPAR) 5 MG tablet Take 7.5 mg by mouth 2 (two) times daily.     Historical Provider, MD  cholestyramine Lucrezia Starch) 4 G packet Take 4 g by mouth daily as needed (loose stools/ IBS).     Historical Provider, MD  clotrimazole (LOTRIMIN) 1 % cream Apply 1 application topically daily as needed (rash). To  stomach    Historical Provider, MD  colchicine 0.6 MG tablet Take 0.6 mg by mouth daily.    Historical Provider, MD  diphenoxylate-atropine (LOMOTIL) 2.5-0.025 MG tablet Take 1-2 tablets by mouth 4 (four) times daily as needed for diarrhea or loose stools. 07/18/15   Eppie Gibson, MD  DULoxetine (CYMBALTA) 30 MG capsule Take 1 capsule (30 mg total) by mouth 2 (two) times daily. 09/16/15   Alda Berthold, DO  ergocalciferol (VITAMIN D2) 50000 UNITS capsule Take 50,000 Units by mouth every Friday. Take on Friday    Historical Provider, MD  Febuxostat (ULORIC) 80 MG TABS Take 80 mg by mouth at bedtime.     Historical Provider, MD  fesoterodine (TOVIAZ) 4 MG TB24 Take 4 mg by  mouth daily.      Historical Provider, MD  gabapentin (NEURONTIN) 300 MG capsule Take 1 capsule (300 mg total) by mouth 2 (two) times daily. 09/16/15   Donika K Patel, DO  guaiFENesin (MUCINEX) 600 MG 12 hr tablet Take 600 mg by mouth 2 (two) times daily.     Historical Provider, MD  HUMULIN R U-500 KWIKPEN 500 UNIT/ML kwikpen Inject 110 Units into the skin daily with breakfast. Inject 110 units in am, 60 units with lunch and 60 units with supper Patient taking differently: Inject 80-120 Units into the skin 3 (three) times daily with meals. Inject 120 units subcutaneously daily before breakfast, 80 units before lunch and 80 units before supper 08/19/15   Nishant Dhungel, MD  HYDROcodone-acetaminophen (NORCO) 5-325 MG tablet Take 1 tablet by mouth every 6 (six) hours as needed for moderate pain. 06/16/15   Daryll Brod, MD  ipratropium (ATROVENT) 0.02 % nebulizer solution Take 2.5 mLs (0.5 mg total) by nebulization 4 (four) times daily as needed for wheezing or shortness of breath. 01/09/16   Praveen Mannam, MD  ketoconazole (NIZORAL) 2 % cream Apply 1 application topically daily. Patient taking differently: Apply 1 application topically daily as needed (rash).  04/13/15   Donnel Saxon, CNM  Liraglutide (VICTOZA) 18 MG/3ML SOPN Inject 1.8 mg  into the skin daily at 12 noon.    Historical Provider, MD  LORazepam (ATIVAN) 0.5 MG tablet Take 1 tablet by mouth 30 minutes before radiotherapy, PRN nausea 07/11/15   Eppie Gibson, MD  LORazepam (ATIVAN) 1 MG tablet Take 1 mg by mouth 3 (three) times daily as needed for anxiety.    Historical Provider, MD  losartan (COZAAR) 100 MG tablet TAKE 1 TABLET (100MG) BY MOUTH DAILY. Patient taking differently: Take 100 mg by mouth daily.  12/20/15   Jettie Booze, MD  meclizine (ANTIVERT) 12.5 MG tablet Take 12.5 mg by mouth at bedtime.  12/05/14   Historical Provider, MD  megestrol (MEGACE) 40 MG tablet TAKE 2 TABLETS (80 MG TOTAL) BY MOUTH 3 (THREE) TIMES DAILY. 12/13/15   Melissa D Cross, NP  metolazone (ZAROXOLYN) 5 MG tablet TAKE 1 TABLET (5 MG TOTAL) BY MOUTH SEE ADMIN INSTRUCTIONS. 3 TIMES WEEKLY AS NEEDED FOR EDEMA 09/13/14   Jettie Booze, MD  Multiple Vitamin (MULTIVITAMIN WITH MINERALS) TABS tablet Take 1 tablet by mouth daily.    Historical Provider, MD  nebivolol (BYSTOLIC) 10 MG tablet TAKE 1 TABLET (10 MG TOTAL) BY MOUTH DAILY. 01/10/16   Jettie Booze, MD  Omega-3 Fatty Acids (OMEGA-3 FISH OIL) 1200 MG CAPS Take 2 capsules (2,400 mg total) by mouth 2 (two) times daily. 09/07/14   Jettie Booze, MD  omeprazole (PRILOSEC) 40 MG capsule Take 40 mg by mouth 2 (two) times daily.     Historical Provider, MD  OXYGEN Inhale 4 L into the lungs continuous.     Historical Provider, MD  phenazopyridine (PYRIDIUM) 95 MG tablet Take 1 tablet (95 mg total) by mouth 3 (three) times daily as needed for pain. 08/05/15   Melissa D Cross, NP  potassium chloride SA (K-DUR,KLOR-CON) 20 MEQ tablet Take 20-40 mEq by mouth 4 (four) times daily. Take 1 tablet (20 meq) by mouth every morning and at bedtime, take 2 tablets (40 meq) with lunch and supper    Historical Provider, MD  primidone (MYSOLINE) 50 MG tablet Take 1 tablet (50 mg total) by mouth 2 (two) times daily. 09/16/15   Alda Berthold, DO  Probiotic Product (PROBIOTIC PO) Take 1 tablet by mouth daily.     Historical Provider, MD  promethazine (PHENERGAN) 25 MG tablet Take 1 tablet (25 mg total) by mouth every 4 (four) hours as needed for nausea or vomiting. 07/13/15   Campbell Riches, MD  warfarin (COUMADIN) 5 MG tablet Take 2.5-5 mg by mouth daily after supper. Take 1 tablet (5 mg) by mouth on Saturday and Sunday, take 1/2 tablet (2.5 mg) Monday thru Friday    Historical Provider, MD  zolpidem (AMBIEN) 5 MG tablet Take 1 tablet (5 mg total) by mouth at bedtime as needed for sleep. 01/09/16   Marshell Garfinkel, MD   Allergies  Allergen Reactions  . Codeine Other (See Comments)    Heart problems  . Molds & Smuts Shortness Of Breath and Rash  . Allopurinol Other (See Comments)    GI problems   . Ciprofloxacin Other (See Comments)    GI problems  . Doxycycline Other (See Comments)    Gastric problems  . Fexofenadine Other (See Comments)    Hurts joints/pain  . Keflex [Cephalexin] Diarrhea and Nausea Only  . Nitroglycerin Er Nausea And Vomiting  . Oritavancin Itching    Mild-moderate itching.   . Sulfa Drugs Cross Reactors Nausea And Vomiting  . Accolate [Zafirlukast] Other (See Comments)     rash  . Amoxicillin-Pot Clavulanate Other (See Comments)     GI problems  . Morphine Other (See Comments)    Nausea and irregular HR(flutter)  . Nystatin Rash  . Septra [Sulfamethoxazole-Trimethoprim] Other (See Comments)    unknown    FAMILY HISTORY:  Family History  Problem Relation Age of Onset  . Diabetes Father   . Hypertension Father   . Dementia Father     Deceased, 42  . Pneumonia Father   . Heart disease Mother   . Clotting disorder Mother   . Hypertension Mother   . Heart attack Mother     Deceased, 76  . Multiple myeloma Paternal Grandmother   . Cancer Paternal Grandmother     multiple myoloma  . Heart disease Paternal Grandfather   . Kidney disease Maternal Grandmother   . Hypertension Sister   . Cancer  Paternal Aunt     breast  . Cancer Cousin     breast - all three   SOCIAL HISTORY: Social History   Social History  . Marital Status: Single    Spouse Name: N/A  . Number of Children: 0  . Years of Education: N/A   Occupational History  . disabled    Social History Main Topics  . Smoking status: Never Smoker   . Smokeless tobacco: Never Used  . Alcohol Use: No     Comment: occasional - once or twice a year  . Drug Use: No  . Sexual Activity: Not Currently    Birth Control/ Protection: IUD   Other Topics Concern  . None   Social History Narrative   Lives alone.  She has been on disability since 1999 for morbid obesity.    Single, no children.   She has a caregiver that comes 7-days a week, ~5 hours each day.    REVIEW OF SYSTEMS:   Constitutional: Negative for fever, chills, weight loss, malaise/fatigue and diaphoresis.  HENT: Negative for hearing loss, ear pain, nosebleeds, congestion, sore throat, neck pain, tinnitus and ear discharge.   Eyes: Negative for blurred vision, double vision, photophobia, pain, discharge and redness.  Respiratory: Negative for cough, hemoptysis, sputum  production, shortness of breath, wheezing and stridor.   Cardiovascular: Negative for chest pain, palpitations, orthopnea, claudication, leg swelling and PND.  Gastrointestinal: Negative for heartburn, nausea, vomiting, abdominal pain, diarrhea, constipation, blood in stool and melena.  Genitourinary: Negative for dysuria, urgency, frequency, hematuria and flank pain.  Musculoskeletal: Negative for myalgias, back pain, joint pain and falls.  Skin: Negative for itching and rash.  Neurological: Negative for dizziness, tingling, tremors, sensory change, speech change, focal weakness, seizures, loss of consciousness, weakness and headaches.  Endo/Heme/Allergies: Negative for environmental allergies and polydipsia. Does not bruise/bleed easily.  SUBJECTIVE: As above.  VITAL SIGNS: Temp:  [98.1  F (36.7 C)] 98.1 F (36.7 C) (07/19 1357) Pulse Rate:  [84-95] 84 (07/19 1445) Resp:  [20-23] 23 (07/19 1530) BP: (135-194)/(70-97) 140/80 mmHg (07/19 1530) SpO2:  [95 %-100 %] 100 % (07/19 1445) FiO2 (%):  [60 %-100 %] 60 % (07/19 1459)  PHYSICAL EXAMINATION: General: Chronically ill appearing, resting in bed, in NAD. Neuro: A&O x 3, non-focal.  HEENT: [place]/AT. PERRL, sclerae anicteric. Trach in site, mild blood tinged secretions around trach. Cardiovascular: RRR, no M/R/G.  Lungs: Respirations even and unlabored.  Distant BS bilaterally. Abdomen: Morbidly obese, BS x 4, soft, NT/ND.  Musculoskeletal: No gross deformities, chronic edema to BLE's. Skin: Intact, warm, no rashes.    Recent Labs Lab 01/06/16 0837 01/07/16 1046 01/11/16 1350  NA 139 136 136  K 5.3 Repeated and Verified* 4.5 4.3  CL  --  102 102  CO2 27 27 29   BUN 11.4 9 8   CREATININE 1.0 0.73 0.77  GLUCOSE 238* 167* 320*    Recent Labs Lab 01/06/16 0837 01/07/16 1046 01/11/16 1350  HGB 11.4* 10.8* 12.1  HCT 35.3 34.9* 40.2  WBC 6.4 6.8 10.2  PLT 176 162 227   Dg Chest Port 1 View  01/11/2016  CLINICAL DATA:  Short of breath.  Asthma.  CHF and pneumonia. EXAM: PORTABLE CHEST 1 VIEW COMPARISON:  01/07/2016.  CT of 11/29/2015. FINDINGS: Mildly degraded exam due to AP portable technique and patient body habitus. Patient rotated to the left. Tracheostomy is appropriately positioned. Cardiomegaly accentuated by AP portable technique. Limited evaluation for pleural fluid. No pneumothorax. Relatively diffuse pulmonary metastasis again identified. Lung bases not well evaluated. IMPRESSION: Diffuse pulmonary metastatic disease. Otherwise, limited exam. Cannot exclude pleural fluid and/or bibasilar airspace disease. Electronically Signed   By: Abigail Miyamoto [name]   On: 01/11/2016 14:14    ASSESSMENT / PLAN:  Chronic vent dependent respiratory failure. Hypoxia -- r/t malpositioned trach. Resolved with trach change.   Tracheitis v VAP. 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 chronic intermittent hemoptysis. OSA. Plan: Observe overnight  SDU vent bed - [facility] to admit  Vent support  Sputum culture  Hold off on abx for now  Trend fever curve  DNR  Continue home BD's  Can likely DC in AM.  Rest per primary team.  CC time:  35 minutes.   Montey Hora, [place] [facility] Pager: [phone]  or [phone] 01/11/2016, 4:30 PM   PCCM Attending Note: Patient seen and examined with physician's assistant. Please refer to his consultation note which I have reviewed in detail. Patient presented with acute hypoxic respiratory failure today due to kinked/malpositioned tracheostomy inner cannula. Patient subsequently underwent tracheostomy change in the emergency department with immediate resolution of hypoxia. Continuing to wean FiO2 at this time. Known chronic ventilator dependence. Patient does admit to some subjective  infectious symptoms as well as increasing mucus production that is blood-tinged. This could be possibly due to an infectious etiology or possibly her underlying pulmonary metastases. Patient denies any chest pain or pressure. She denies any nausea or vomiting. She has no recent travel. No recent sick contacts.  BP 140/80 mmHg  Pulse 91  Temp(Src) 98.1 F (36.7 C) (Axillary)  Resp 23  SpO2 100% Gen.: Patient sitting up in bed on ventilator. No distress. Caregiver at bedside. Integument: Warm and dry. No rash on exposed skin. Changes of lymphedema in bilateral lower extremities.  Neurological: Oriented to person, place, year, president, and situation. No meningismus. Pulmonary: Distant and diminished breath sounds bilaterally. Normal work of breathing on ventilator support. Tracheostomy in place.  CBC Latest Ref Rng 01/11/2016 01/07/2016 01/06/2016  WBC 4.0 - 10.5 K/uL 10.2 6.8 6.4  Hemoglobin 12.0 - 15.0 g/dL 12.1 10.8(L) 11.4(L)    Hematocrit 36.0 - 46.0 % 40.2 34.9(L) 35.3  Platelets 150 - 400 K/uL 227 162 176    BMP Latest Ref Rng 01/11/2016 01/07/2016 01/06/2016  Glucose 65 - 99 mg/dL 320(H) 167(H) 238(H)  BUN 6 - 20 mg/dL 8 9 11.4  Creatinine 0.44 - 1.00 mg/dL 0.77 0.73 1.0  Sodium 135 - 145 mmol/L 136 136 139  Potassium 3.5 - 5.1 mmol/L 4.3 4.5 5.3 Repeated and Verified(H)  Chloride 101 - 111 mmol/L 102 102 -  CO2 22 - 32 mmol/L 29 27 27   Calcium 8.9 - 10.3 mg/dL 8.6(L) 9.0 9.65    A/P: 55 year old morbidly obese female with chronic ventilator dependence and chronic tracheostomy. Known metastatic endometrial cancer. Patient has had previous discussions with hospice as well as palliative medicine. She wishes to remain in her own home as long as possible but recognizes that she may eventually have to go to an inpatient hospice house. Hypoxia has resolved with changing patient's tracheostomy inner cannula. Still have a suspicion about possible underlying infectious tracheobronchitis versus symptoms from her pulmonary metastases. Patient does not have a leukocytosis at this time that would support an infectious process but this still must be considered. Given patient's significant improvement I feel it's reasonable to place the patient in observation overnight in a monitored ventilator bed. I see no indication for an intensive care unit admission at this time.  1. Acute hypoxic respiratory failure: He is improving status post tracheostomy change.Continuing ventilator support for now and when necessary as well as nocturnal all sleeping. Weaning FiO2 and ventilator support for saturation greater than 94%. 2. Possible tracheobronchitis: Checking tracheal aspirate and culture. Holding on antibiotics at this time unless gram stain shows organisms. 3. CODE STATUS/metastatic endometrial cancer: Patient confirms a DO NOT RESUSCITATE status. We will continue her current level of medical care. Consult the palliative medicine  placed.  Remainder as per primary service. We will continue to follow for ventilator/tracheostomy needs.  Sonia Baller Ashok Cordia, [name] [facility] Pulmonary [facility] Critical [facility] Pager:  [phone] After 3pm or if no response, call [phone] 5:02 PM 01/11/2016

## 2016-01-11 NOTE — Progress Notes (Signed)
Interdisciplinary Goals of Care Family Meeting    Date carried out:: 01/11/2016  Location of the meeting: Bedside  Member's involved: Nurse Practitioner  Durable Power of Attorney or acting medical decision maker: The patient states she does not want CPR or ACLS  Discussion: I discussed goals of care for [name] .    Code status: Full DNR; no Pressors or ACLS  Disposition: Continue current acute care  Time spent for the meeting:  She will continue current vent support Obtain sputum culture Monitor over night In the event of cardiac arrest she will be full DNR and no ACLS  [name] 01/11/2016 4:06 PM  Erick Colace ACNP-BC [place] Pager # [phone] OR # [phone] if no answer

## 2016-01-11 NOTE — ED Notes (Signed)
Pt cleaned uo after urinating in the bed.  Nauseated    Trying to give her med to keep her meds down

## 2016-01-11 NOTE — ED Notes (Signed)
The pt is  Alert coughing somewhat from the trach change.  The pt is waiting on someone from critical care to see the pt

## 2016-01-11 NOTE — H&P (Signed)
History and Physical    [name] DGU:[id number] DOB: [birth year] DOA: 01/11/2016  PCP: [name], MD  Patient coming from:   Home    Chief Complaint:  Respiratory failure  HPI: [name] is a 55 y.o. female with a medical history significant for, but not  limited to, DM 2,  Morbid obesity, severe OSA/ chronic trach on vent at night.and endometrial cancer diagnosed July 2016, s/p XRT. 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 She was not a candidate for chemo but tamoxifen / megace.was discussed at [place] visit a few days ago. Patient has been on vent 24/7 since over the last month.  Her SOB has gotten progressively worse per home caregiver. She has continued to have bloody drainage from trach . Brought in by [facility] today for dyspnea, Vent alarm was going off, patient was 80% on room air which increased to normal with bagging.   ED Course:  Afebrile, VSS.  ABGs. pH 7.26, pC02 73 Trop negative. WBC 10.2., glucose 320 Diffuse metastatic disease  Review of Systems:  Intermittent chest pain over the last few weeks associated with increasing shortness of breath. As per HPI, otherwise 10 point review of systems negative.    Past Medical History  Diagnosis Date  . Morbid obesity ([place])   . Depressive disorder, not elsewhere classified   . Asthma   . Dyslipidemia   . Clostridium difficile enterocolitis 2012  . Splenomegaly 06/01/2011  . Thrombocytopenia ([place]) 06/01/2011  . Hepatosplenomegaly   . Gout   . GERD (gastroesophageal reflux disease)   . Osteoarthritis   . Polyneuropathy ([place])   . Urinary incontinence   . Amenorrhea   . CHF (congestive heart failure) ([place])   . Hyperlipidemia   . Palpitations   . Anginal pain ([place])     no chest pain in years  . Dysrhythmia     heart palpations  . Anxiety   . Pneumonia   . Shortness of breath   . Chronic kidney disease     overactive bladder  . Peripheral  neuropathy ([place])   . DVT (deep venous thrombosis) ([place])   . OSA (obstructive sleep apnea)     uses ventilator ( has trach)  . Diabetes mellitus     type 2  . Irritable bowel syndrome   . PONV (postoperative nausea and vomiting)     just nausea  . Endometrial cancer ([place]) dx'd 01/19/15    Past Surgical History  Procedure Laterality Date  . Cholecystectomy    . Tracheostomy  2001  . Endometrial ablation    . Skin graft    . Finger surgery      ring finger on right hand  . Esophageal dilation  2003  . Breast biopsy      needle core, left  . Dilation and curettage of uterus      times 2  . Tracheal dilitation  01/24/2012    Procedure: TRACHEAL DILITATION;  Surgeon: Melissa Montane, MD;  Location: [place];  Service: ENT;  Laterality: N/A;  Trache change with possible dilation, from size 6 to size 7 uncuffed  . Amputation Right 11/19/2013    Procedure: AMPUTATION DIGIT;  Surgeon: Cammie Sickle, MD;  Location: [facility];  Service: Orthopedics;  Laterality: Right;  Partial amputation right long finger, Debridement right ring finger   . Amputation Right 06/03/2014    Procedure: REVISION AMPUTATION RIGHT RING FINGER;  Surgeon: Daryll Brod, MD;  Location: [facility];  Service: Orthopedics;  Laterality: Right;  . Dilatation & currettage/hysteroscopy with resectocope N/A 01/17/2015    Procedure: DILATATION & CURETTAGE/HYSTEROSCOPY WITH RESECTOCOPE; Pap Smear;  Surgeon: Ena Dawley, MD;  Location: [facility];  Service: Gynecology;  Laterality: N/A;  . Intrauterine device (iud) insertion N/A 01/17/2015    Procedure: INTRAUTERINE DEVICE (IUD) INSERTION;  Surgeon: Ena Dawley, MD;  Location: [facility];  Service: Gynecology;  Laterality: N/A;  . Finger surgery Bilateral 06/16/15    I & D of multiple fingers  . Incision and drainage Left 06/16/2015    Procedure: IRRIGATION AND DEBRIDEMENT OF LEFT RING FINGER AND LEFT SMALL FINGER.;  Surgeon: Daryll Brod, MD;  Location: [facility];  Service: Orthopedics;  Laterality: Left;  . Dressing change under anesthesia Right 06/16/2015    Procedure: DRESSING CHANGE UNDER ANESTHESIA TO RIGHT THUMB AND RIGHT FIRST FINGER.;  Surgeon: Daryll Brod, MD;  Location: [facility];  Service: Orthopedics;  Laterality: Right;    Social History   Social History  . Marital Status: Single    Spouse Name: N/A  . Number of Children: 0  . Years of Education: N/A   Occupational History  . disabled    Social History Main Topics  . Smoking status: Never Smoker   . Smokeless tobacco: Never Used  . Alcohol Use: No     Comment: occasional - once or twice a year  . Drug Use: No  . Sexual Activity: Not Currently    Birth Control/ Protection: IUD   Other Topics Concern  . Not on file   Social History Narrative   Lives alone.  She has been on disability since 1999 for morbid obesity.    Single, no children.   She has a caregiver that comes 7-days a week, ~5 hours each day.  Lives a lone. Has nursing asst during day and nurse in the evening. Non-ambulatory for last few weeks.  Allergies  Allergen Reactions  . Codeine Other (See Comments)    Heart problems  . Molds & Smuts Shortness Of Breath and Rash  . Allopurinol Other (See Comments)    GI problems   . Ciprofloxacin Other (See Comments)    GI problems  . Doxycycline Other (See Comments)    Gastric problems  . Fexofenadine Other (See Comments)    Hurts joints/pain  . Keflex [Cephalexin] Diarrhea and Nausea Only  . Nitroglycerin Er Nausea And Vomiting  . Oritavancin Itching    Mild-moderate itching.   . Sulfa Drugs Cross Reactors Nausea And Vomiting  . Accolate [Zafirlukast] Other (See Comments)     rash  . Amoxicillin-Pot Clavulanate Other (See Comments)     GI problems  . Morphine Other (See Comments)    Nausea and irregular HR(flutter)  . Nystatin Rash  . Septra [Sulfamethoxazole-Trimethoprim] Other (See Comments)    unknown    Family History  Problem Relation Age of  Onset  . Diabetes Father   . Hypertension Father   . Dementia Father     Deceased, 88  . Pneumonia Father   . Heart disease Mother   . Clotting disorder Mother   . Hypertension Mother   . Heart attack Mother     Deceased, 52  . Multiple myeloma Paternal Grandmother   . Cancer Paternal Grandmother     multiple myoloma  . Heart disease Paternal Grandfather   . Kidney disease Maternal Grandmother   . Hypertension Sister   . Cancer  Paternal Aunt     breast  . Cancer Cousin     breast - all three    Prior to Admission medications   Medication Sig Start Date End Date Taking? Authorizing Provider  acetaminophen (TYLENOL) 500 MG tablet Take 1,000 mg by mouth every 6 (six) hours as needed for mild pain. For pain    Historical Provider, MD  albuterol (PROVENTIL) (2.5 MG/3ML) 0.083% nebulizer solution Take 2.5 mg by nebulization every 4 (four) hours as needed for wheezing or shortness of breath.  05/28/11   Chesley Mires, MD  atorvastatin (LIPITOR) 10 MG tablet Take 10 mg by mouth at bedtime.     Historical Provider, MD  budesonide (PULMICORT) 0.25 MG/2ML nebulizer solution Take 2 mLs (0.25 mg total) by nebulization 2 (two) times daily as needed (shortness of breath). 01/09/16   Praveen Mannam, MD  bumetanide (BUMEX) 2 MG tablet TAKE 1 TABLET (2 MG TOTAL) BY MOUTH DAILY. 09/14/15   Jettie Booze, MD  busPIRone (BUSPAR) 5 MG tablet Take 7.5 mg by mouth 2 (two) times daily.     Historical Provider, MD  cholestyramine Lucrezia Starch) 4 G packet Take 4 g by mouth daily as needed (loose stools/ IBS).     Historical Provider, MD  clotrimazole (LOTRIMIN) 1 % cream Apply 1 application topically daily as needed (rash). To stomach    Historical Provider, MD  colchicine 0.6 MG tablet Take 0.6 mg by mouth daily.    Historical Provider, MD  diphenoxylate-atropine (LOMOTIL) 2.5-0.025 MG tablet Take 1-2 tablets by mouth 4 (four) times daily as needed for diarrhea or loose stools. 07/18/15   Eppie Gibson, MD    DULoxetine (CYMBALTA) 30 MG capsule Take 1 capsule (30 mg total) by mouth 2 (two) times daily. 09/16/15   Alda Berthold, DO  ergocalciferol (VITAMIN D2) 50000 UNITS capsule Take 50,000 Units by mouth every Friday. Take on Friday    Historical Provider, MD  Febuxostat (ULORIC) 80 MG TABS Take 80 mg by mouth at bedtime.     Historical Provider, MD  fesoterodine (TOVIAZ) 4 MG TB24 Take 4 mg by mouth daily.      Historical Provider, MD  gabapentin (NEURONTIN) 300 MG capsule Take 1 capsule (300 mg total) by mouth 2 (two) times daily. 09/16/15   Donika K Patel, DO  guaiFENesin (MUCINEX) 600 MG 12 hr tablet Take 600 mg by mouth 2 (two) times daily.     Historical Provider, MD  HUMULIN R U-500 KWIKPEN 500 UNIT/ML kwikpen Inject 110 Units into the skin daily with breakfast. Inject 110 units in am, 60 units with lunch and 60 units with supper Patient taking differently: Inject 80-120 Units into the skin 3 (three) times daily with meals. Inject 120 units subcutaneously daily before breakfast, 80 units before lunch and 80 units before supper 08/19/15   Nishant Dhungel, MD  HYDROcodone-acetaminophen (NORCO) 5-325 MG tablet Take 1 tablet by mouth every 6 (six) hours as needed for moderate pain. 06/16/15   Daryll Brod, MD  ipratropium (ATROVENT) 0.02 % nebulizer solution Take 2.5 mLs (0.5 mg total) by nebulization 4 (four) times daily as needed for wheezing or shortness of breath. 01/09/16   Praveen Mannam, MD  ketoconazole (NIZORAL) 2 % cream Apply 1 application topically daily. Patient taking differently: Apply 1 application topically daily as needed (rash).  04/13/15   Donnel Saxon, CNM  Liraglutide (VICTOZA) 18 MG/3ML SOPN Inject 1.8 mg into the skin daily at 12 noon.    Historical Provider, MD  LORazepam (ATIVAN) 0.5 MG tablet Take 1 tablet by mouth 30 minutes before radiotherapy, PRN nausea 07/11/15   Eppie Gibson, MD  LORazepam (ATIVAN) 1 MG tablet Take 1 mg by mouth 3 (three) times daily as needed for anxiety.     Historical Provider, MD  losartan (COZAAR) 100 MG tablet TAKE 1 TABLET (100MG) BY MOUTH DAILY. Patient taking differently: Take 100 mg by mouth daily.  12/20/15   Jettie Booze, MD  meclizine (ANTIVERT) 12.5 MG tablet Take 12.5 mg by mouth at bedtime.  12/05/14   Historical Provider, MD  megestrol (MEGACE) 40 MG tablet TAKE 2 TABLETS (80 MG TOTAL) BY MOUTH 3 (THREE) TIMES DAILY. 12/13/15   Melissa D Cross, NP  metolazone (ZAROXOLYN) 5 MG tablet TAKE 1 TABLET (5 MG TOTAL) BY MOUTH SEE ADMIN INSTRUCTIONS. 3 TIMES WEEKLY AS NEEDED FOR EDEMA 09/13/14   Jettie Booze, MD  Multiple Vitamin (MULTIVITAMIN WITH MINERALS) TABS tablet Take 1 tablet by mouth daily.    Historical Provider, MD  nebivolol (BYSTOLIC) 10 MG tablet TAKE 1 TABLET (10 MG TOTAL) BY MOUTH DAILY. 01/10/16   Jettie Booze, MD  Omega-3 Fatty Acids (OMEGA-3 FISH OIL) 1200 MG CAPS Take 2 capsules (2,400 mg total) by mouth 2 (two) times daily. 09/07/14   Jettie Booze, MD  omeprazole (PRILOSEC) 40 MG capsule Take 40 mg by mouth 2 (two) times daily.     Historical Provider, MD  OXYGEN Inhale 4 L into the lungs continuous.     Historical Provider, MD  phenazopyridine (PYRIDIUM) 95 MG tablet Take 1 tablet (95 mg total) by mouth 3 (three) times daily as needed for pain. 08/05/15   Melissa D Cross, NP  potassium chloride SA (K-DUR,KLOR-CON) 20 MEQ tablet Take 20-40 mEq by mouth 4 (four) times daily. Take 1 tablet (20 meq) by mouth every morning and at bedtime, take 2 tablets (40 meq) with lunch and supper    Historical Provider, MD  primidone (MYSOLINE) 50 MG tablet Take 1 tablet (50 mg total) by mouth 2 (two) times daily. 09/16/15   Alda Berthold, DO  Probiotic Product (PROBIOTIC PO) Take 1 tablet by mouth daily.     Historical Provider, MD  promethazine (PHENERGAN) 25 MG tablet Take 1 tablet (25 mg total) by mouth every 4 (four) hours as needed for nausea or vomiting. 07/13/15   Campbell Riches, MD  warfarin (COUMADIN) 5 MG  tablet Take 2.5-5 mg by mouth daily after supper. Take 1 tablet (5 mg) by mouth on Saturday and Sunday, take 1/2 tablet (2.5 mg) Monday thru Friday    Historical Provider, MD  zolpidem (AMBIEN) 5 MG tablet Take 1 tablet (5 mg total) by mouth at bedtime as needed for sleep. 01/09/16   Marshell Garfinkel, MD    Physical Exam: Filed Vitals:   01/11/16 1414 01/11/16 1427 01/11/16 1445 01/11/16 1530  BP:   135/70 140/80  Pulse:  91 84   Temp:      TempSrc:      Resp:  _0 SpO2: 100% 100% 100%     Constitutional:  Morbidly obese white female sitting on bedside commode. She is calm but signaling that she needs air.  Filed Vitals:   01/11/16 1414 01/11/16 1427 01/11/16 1445 01/11/16 1530  BP:   135/70 140/80  Pulse:  91 84   Temp:      TempSrc:      Resp:  _1 SpO2: 100% 100% 100%  Eyes: PER, lids and conjunctivae normal ENMT: Normal hearing. Mucous membranes are moist. .  Neck: normal, supple, no masses. Small amount of bloody drainage around trach.  Respiratory: clear to auscultation bilaterally, no wheezing, no crackles. Normal respiratory effort. No accessory muscle use.  Cardiovascular: Regular rate and rhythm, BLE pitting edema.  No extremity edema. 2+ pedal pulses.   Abdomen: no tenderness, no masses palpated. No hepatomegaly. Bowel sounds positive.  Musculoskeletal: no clubbing / cyanosis. No joint deformity upper and lower extremities. Good ROM, no contractures. Normal muscle tone.  Skin: several scabbed over lesions distal LLE.  Neurologic: CN 2-12 grossly intact. Sensation intact, Strength 5/5 in all 4.  Psychiatric: Normal judgment and insight. Alert and oriented x 3. Normal mood.    Labs on Admission: I have personally reviewed following labs and imaging studies   Urine analysis:    Component Value Date/Time   COLORURINE YELLOW 01/07/2016 [place] 01/07/2016 1345   LABSPEC 1.018 01/07/2016 1345   LABSPEC 1.010 07/20/2015 1104   PHURINE 6.0  01/07/2016 1345   PHURINE 6.5 07/20/2015 1104   GLUCOSEU NEGATIVE 01/07/2016 [place] 07/20/2015 1104   HGBUR NEGATIVE 01/07/2016 1345   HGBUR Moderate 07/20/2015 1104   BILIRUBINUR NEGATIVE 01/07/2016 1345   BILIRUBINUR Negative 07/20/2015 1104   KETONESUR NEGATIVE 01/07/2016 1345   KETONESUR Negative 07/20/2015 1104   PROTEINUR NEGATIVE 01/07/2016 1345   PROTEINUR < 30 07/20/2015 1104   UROBILINOGEN 0.2 07/20/2015 1104   UROBILINOGEN 0.2 11/08/2014 1201   NITRITE NEGATIVE 01/07/2016 1345   NITRITE Negative 07/20/2015 1104   LEUKOCYTESUR NEGATIVE 01/07/2016 1345   LEUKOCYTESUR Negative 07/20/2015 1104    Radiological Exams on Admission: Dg Chest Port 1 View  01/11/2016  CLINICAL DATA:  Short of breath.  Asthma.  CHF and pneumonia. EXAM: PORTABLE CHEST 1 VIEW COMPARISON:  01/07/2016.  CT of 11/29/2015. FINDINGS: Mildly degraded exam due to AP portable technique and patient body habitus. Patient rotated to the left. Tracheostomy is appropriately positioned. Cardiomegaly accentuated by AP portable technique. Limited evaluation for pleural fluid. No pneumothorax. Relatively diffuse pulmonary metastasis again identified. Lung bases not well evaluated. IMPRESSION: Diffuse pulmonary metastatic disease. Otherwise, limited exam. Cannot exclude pleural fluid and/or bibasilar airspace disease. Electronically Signed   By: Abigail Miyamoto [name]   On: 01/11/2016 14:14    EKG: Independently reviewed.   EKG Interpretation  Date/Time:  Wednesday January 11 2016 13:53:30 EDT Ventricular Rate:  93 PR Interval:    QRS Duration: 117 QT Interval:  360 QTC Calculation: 448 R Axis:   34 Text Interpretation:  Sinus rhythm Nonspecific intraventricular conduction delay Low voltage, precordial leads Confirmed by HAVILAND MD, JULIE ([id number]) on 01/11/2016 2:35:07 PM       Assessment/Plan   Acute on chronic respiratory failure ([place]). Patient has chronic trach / vent dependent at night for severe OSA.  Recently diagnosed with metastatic endometrial cancer to lungs requiring vent 24/7 over last few weeks.  -admit to Stepdown bed. Pulmonary Care has evaluated, changed trach. Continue ventilator support for now.  -tracheal aspirate / culture ordered by Pulmonary, Antibiotics may be needed depending on gram stain. -PCCM has made patient a DNR -I have consulted Palliative Medicine team. Appreciate their assistance.   Metastatic uterine cancer, was not chemotherapy candidate  Diabetes mellitus type 2 in obese ([place]).  -On massive amount of Regular nsulin at home. I have reduced  to 60 units TID.  -monitor CBGs,  add SSI  Chronic anticoagulation - history of remote DVT. Also, at risk for DVT given immobility / cancer. INR 2 days ago was 1.99 -continue coumadin per pharmacy  HTN (hypertension), benign -continue home PO meds   Mixed hyperlipidemia -continue home statin   DVT prophylaxis:    on coumadin  Code Status:      DNR Family Communication:  Treatment plan discussed with Hillary Bow, patient's first cousin at [phone]. She understands and agrees with plan. Patient has a sister though they are not in contact.   Disposition Plan:  To be determined, probably home in 2 days or inpatient Hospice              Consults called:  PCCM and Palliative Medicine  Admission status:  Admit to [place] NP [facility] Pager [phone]  If 7PM-7AM, please contact night-coverage [email or web address] Password [facility]  01/11/2016, 4:23 PM

## 2016-01-11 NOTE — Progress Notes (Signed)
ABG obtained after patient has been placed on ventilator.  Results were given to MD and instructed to increase respiratory rate to 16 and decreased FIO2 to 60%.  Will continue to monitor.    Ref. Range 01/11/2016 14:52  Sample type Unknown ARTERIAL  pH, Arterial Latest Ref Range: 7.350-7.450  7.264 (L)  pCO2 arterial Latest Ref Range: 35.0-45.0 mmHg 73.7 (HH)  pO2, Arterial Latest Ref Range: 80.0-100.0 mmHg 282.0 (H)  Bicarbonate Latest Ref Range: 20.0-24.0 mEq/L 33.5 (H)  TCO2 Latest Ref Range: 0-100 mmol/L 36  Acid-Base Excess Latest Ref Range: 0.0-2.0 mmol/L 4.0 (H)  O2 Saturation Latest Units: % 100.0  Patient temperature Unknown 98.1 F  Collection site Unknown RADIAL, ALLEN'S T.Marland KitchenMarland Kitchen

## 2016-01-11 NOTE — Progress Notes (Signed)
ANTICOAGULATION CONSULT NOTE - Initial Consult  Pharmacy Consult for Warfarin Indication: history of DVT  Allergies  Allergen Reactions  . Codeine Other (See Comments)    Heart problems  . Molds & Smuts Shortness Of Breath and Rash  . Allopurinol Other (See Comments)    GI problems   . Ciprofloxacin Other (See Comments)    GI problems  . Doxycycline Other (See Comments)    Gastric problems  . Fexofenadine Other (See Comments)    Hurts joints/pain  . Keflex [Cephalexin] Diarrhea and Nausea Only  . Nitroglycerin Er Nausea And Vomiting  . Oritavancin Itching    Mild-moderate itching.   . Sulfa Drugs Cross Reactors Nausea And Vomiting  . Accolate [Zafirlukast] Other (See Comments)     rash  . Amoxicillin-Pot Clavulanate Other (See Comments)     GI problems  . Morphine Other (See Comments)    Nausea and irregular HR(flutter)  . Nystatin Rash  . Septra [Sulfamethoxazole-Trimethoprim] Other (See Comments)    unknown    Patient Measurements:    Vital Signs: Temp: 98.1 F (36.7 C) (07/19 1357) Temp Source: Axillary (07/19 1357) BP: 158/95 mmHg (07/19 1715) Pulse Rate: 87 (07/19 1715)  Labs:  Recent Labs  01/11/16 1350 01/11/16 2028  HGB 12.1  --   HCT 40.2  --   PLT 227  --   LABPROT  --  20.6*  INR  --  1.78*  CREATININE 0.77  --   TROPONINI <0.03  --     CrCl cannot be calculated (Unknown ideal weight.).   Medical History: Past Medical History  Diagnosis Date  . Morbid obesity ([place])   . Depressive disorder, not elsewhere classified   . Asthma   . Dyslipidemia   . Clostridium difficile enterocolitis 2012  . Splenomegaly 06/01/2011  . Thrombocytopenia ([place]) 06/01/2011  . Hepatosplenomegaly   . Gout   . GERD (gastroesophageal reflux disease)   . Osteoarthritis   . Polyneuropathy ([place])   . Urinary incontinence   . Amenorrhea   . CHF (congestive heart failure) ([place])   . Hyperlipidemia   . Leg swelling   . Nausea   . Weakness   . Palpitations   .  Anginal pain ([place])     no chest pain in years  . Dysrhythmia     heart palpations  . Anxiety   . Pneumonia   . Shortness of breath   . Chronic kidney disease     overactive bladder  . Peripheral neuropathy ([place])   . DVT (deep venous thrombosis) ([place])   . OSA (obstructive sleep apnea)     uses ventilator ( has trach)  . Diabetes mellitus     type 2  . Irritable bowel syndrome   . PONV (postoperative nausea and vomiting)     just nausea  . Endometrial cancer ([place]) dx'd 01/19/15    Medications:   (Not in a hospital admission) Scheduled:  . atorvastatin  10 mg Oral QHS  . bumetanide  2 mg Oral Daily  . busPIRone  7.5 mg Oral BID  . colchicine  0.6 mg Oral Daily  . DULoxetine  30 mg Oral BID  . Febuxostat  80 mg Oral QHS  . fesoterodine  4 mg Oral Daily  . gabapentin  300 mg Oral BID  . guaiFENesin  600 mg Oral BID  . [START ON 01/12/2016] insulin aspart  0-20 Units Subcutaneous TID WC  . insulin regular human CONCENTRATED  60 Units Subcutaneous Q breakfast  .  losartan  100 mg Oral Daily  . meclizine  12.5 mg Oral QHS  . metolazone  5 mg Oral Once per day on Mon Wed Fri  . nebivolol  10 mg Oral Daily  . ondansetron (ZOFRAN) IV  8 mg Intravenous Once  . pantoprazole  80 mg Oral Daily  . [START ON 01/12/2016] phenazopyridine  100 mg Oral TID WC  . potassium chloride SA  20-40 mEq Oral QID  . primidone  50 mg Oral BID   Infusions:    Assessment: 55yo female with history of DVT on warfarin PTA presents with respiratory distress. Pharmacy is consulted to dose warfarin for history of DVT.   PTA Warfarin Dose: 5mg  Fri/Sun and 2.5mg  AODs with last dose 01/10/16  Goal of Therapy:  INR 2-3 Monitor platelets by anticoagulation protocol: Yes   Plan:  Warfarin 5mg  tonight x1 Daily INR Monitor s/sx of bleeding  Andrey Cota. Diona Foley, PharmD, [facility] Clinical Pharmacist Pager [phone] 01/11/2016,5:48 PM

## 2016-01-11 NOTE — ED Notes (Addendum)
RN called patient placement- pt to be ICU overflow d/t SDU being capped with vent patients; continue to await room assignment

## 2016-01-12 DIAGNOSIS — J9612 Chronic respiratory failure with hypercapnia: Secondary | ICD-10-CM

## 2016-01-12 DIAGNOSIS — Z7189 Other specified counseling: Secondary | ICD-10-CM

## 2016-01-12 DIAGNOSIS — E662 Morbid (severe) obesity with alveolar hypoventilation: Secondary | ICD-10-CM

## 2016-01-12 DIAGNOSIS — J449 Chronic obstructive pulmonary disease, unspecified: Secondary | ICD-10-CM

## 2016-01-12 DIAGNOSIS — Z930 Tracheostomy status: Secondary | ICD-10-CM

## 2016-01-12 LAB — GLUCOSE, CAPILLARY
GLUCOSE-CAPILLARY: 169 mg/dL — AB (ref 65–99)
GLUCOSE-CAPILLARY: 284 mg/dL — AB (ref 65–99)
Glucose-Capillary: 186 mg/dL — ABNORMAL HIGH (ref 65–99)

## 2016-01-12 LAB — CBC
HCT: 35.4 % — ABNORMAL LOW (ref 36.0–46.0)
HEMOGLOBIN: 10.5 g/dL — AB (ref 12.0–15.0)
MCH: 27.6 pg (ref 26.0–34.0)
MCHC: 29.7 g/dL — ABNORMAL LOW (ref 30.0–36.0)
MCV: 93.2 fL (ref 78.0–100.0)
Platelets: 166 10*3/uL (ref 150–400)
RBC: 3.8 MIL/uL — AB (ref 3.87–5.11)
RDW: 15.9 % — ABNORMAL HIGH (ref 11.5–15.5)
WBC: 8.6 10*3/uL (ref 4.0–10.5)

## 2016-01-12 LAB — BASIC METABOLIC PANEL
ANION GAP: 5 (ref 5–15)
BUN: 7 mg/dL (ref 6–20)
CALCIUM: 8.8 mg/dL — AB (ref 8.9–10.3)
CHLORIDE: 102 mmol/L (ref 101–111)
CO2: 30 mmol/L (ref 22–32)
Creatinine, Ser: 0.65 mg/dL (ref 0.44–1.00)
GFR calc non Af Amer: >60 mL/min (ref >60)
Glucose, Bld: 165 mg/dL — ABNORMAL HIGH (ref 65–99)
Potassium: 4.4 mmol/L (ref 3.5–5.1)
Sodium: 137 mmol/L (ref 135–145)

## 2016-01-12 LAB — PROTIME-INR
INR: 1.73 — ABNORMAL HIGH (ref 0.00–1.49)
Prothrombin Time: 20.2 seconds — ABNORMAL HIGH (ref 11.6–15.2)

## 2016-01-12 LAB — CBG MONITORING, ED: GLUCOSE-CAPILLARY: 196 mg/dL — AB (ref 65–99)

## 2016-01-12 LAB — MRSA PCR SCREENING: MRSA BY PCR: POSITIVE — AB

## 2016-01-12 MED ORDER — INSULIN REGULAR HUMAN (CONC) 500 UNIT/ML ~~LOC~~ SOPN
60.0000 [IU] | PEN_INJECTOR | Freq: Three times a day (TID) | SUBCUTANEOUS | Status: DC
  Administered 2016-01-12 – 2016-01-13 (×4): 60 [IU] via SUBCUTANEOUS
  Filled 2016-01-12: qty 3

## 2016-01-12 MED ORDER — CHLORHEXIDINE GLUCONATE 0.12% ORAL RINSE (MEDLINE KIT)
15.0000 mL | Freq: Two times a day (BID) | OROMUCOSAL | Status: DC
  Administered 2016-01-12 – 2016-01-13 (×2): 15 mL via OROMUCOSAL

## 2016-01-12 MED ORDER — INSULIN GLARGINE 100 UNIT/ML ~~LOC~~ SOLN
8.0000 [IU] | Freq: Every day | SUBCUTANEOUS | Status: DC
  Administered 2016-01-12 – 2016-01-13 (×2): 8 [IU] via SUBCUTANEOUS
  Filled 2016-01-12 (×2): qty 0.08

## 2016-01-12 MED ORDER — WARFARIN SODIUM 5 MG PO TABS
5.0000 mg | ORAL_TABLET | Freq: Once | ORAL | Status: AC
  Administered 2016-01-12: 5 mg via ORAL
  Filled 2016-01-12: qty 1

## 2016-01-12 MED ORDER — ANTISEPTIC ORAL RINSE SOLUTION (CORINZ)
7.0000 mL | OROMUCOSAL | Status: DC
  Administered 2016-01-12 – 2016-01-13 (×4): 7 mL via OROMUCOSAL

## 2016-01-12 MED ORDER — SODIUM CHLORIDE 0.9% FLUSH
10.0000 mL | INTRAVENOUS | Status: DC | PRN
Start: 2016-01-12 — End: 2016-01-13

## 2016-01-12 MED ORDER — INSULIN REGULAR HUMAN (CONC) 500 UNIT/ML ~~LOC~~ SOLN: 60.0000 [IU] | Freq: Three times a day (TID) | SUBCUTANEOUS | Status: DC

## 2016-01-12 MED ORDER — PNEUMOCOCCAL VAC POLYVALENT 25 MCG/0.5ML IJ INJ: 0.5000 mL | INJECTION | INTRAMUSCULAR | Status: DC

## 2016-01-12 MED ORDER — PHENAZOPYRIDINE HCL 100 MG PO TABS
100.0000 mg | ORAL_TABLET | Freq: Three times a day (TID) | ORAL | Status: DC | PRN
  Filled 2016-01-12: qty 1

## 2016-01-12 MED ORDER — POTASSIUM CHLORIDE CRYS ER 20 MEQ PO TBCR
20.0000 meq | EXTENDED_RELEASE_TABLET | Freq: Two times a day (BID) | ORAL | Status: DC
  Administered 2016-01-12 – 2016-01-13 (×3): 20 meq via ORAL
  Filled 2016-01-12 (×3): qty 1

## 2016-01-12 MED ORDER — POTASSIUM CHLORIDE CRYS ER 20 MEQ PO TBCR: 40.0000 meq | EXTENDED_RELEASE_TABLET | Freq: Two times a day (BID) | ORAL | Status: DC

## 2016-01-12 MED ORDER — SODIUM CHLORIDE 0.9% FLUSH
10.0000 mL | Freq: Two times a day (BID) | INTRAVENOUS | Status: DC
  Administered 2016-01-12: 20 mL
  Administered 2016-01-13: 10 mL

## 2016-01-12 NOTE — Consult Note (Signed)
[facility] room available for patient tomorrow 01/13/16. Patient has completed registration paper work for transfer. Dr. Orpah Melter to assume care per patient's preference. Spoke with RNCM Elissa Hefty to make her aware and request that transport be arranged for 2 pm. [place] is coordinating with [place] and [facility] to coordinate trach and vent needs. PICC line placement has been requested for use at [facility].   Will follow up in am. Please do not hesitate to call with questions.   Thank you,  Erling Conte, LCSW [phone]

## 2016-01-12 NOTE — Progress Notes (Signed)
Peripherally Inserted Central Catheter/Midline Placement  The IV Nurse has discussed with the patient and/or persons authorized to consent for the patient, the purpose of this procedure and the potential benefits and risks involved with this procedure.  The benefits include less needle sticks, lab draws from the catheter and patient may be discharged home with the catheter.  Risks include, but not limited to, infection, bleeding, blood clot (thrombus formation), and puncture of an artery; nerve damage and irregular heat beat.  Alternatives to this procedure were also discussed.  PICC/Midline Placement Documentation  PICC Single Lumen 01/12/16 PICC Right Cephalic 44 cm 1 cm (Active)  Indication for Insertion or Continuance of Line Home intravenous therapies (PICC only) 01/12/2016  8:50 PM  Exposed Catheter (cm) 1 cm 01/12/2016  8:50 PM  Site Assessment Clean;Dry;Intact 01/12/2016  8:50 PM  Line Status Blood return noted;Flushed;Saline locked 01/12/2016  8:50 PM  Dressing Type Transparent 01/12/2016  8:50 PM  Dressing Status Clean;Dry;Intact;Antimicrobial disc in place 01/12/2016  8:50 PM  Dressing Intervention New dressing 01/12/2016  8:50 PM  Dressing Change Due 01/19/16 01/12/2016  8:50 PM       Aldona Lento L 01/12/2016, 9:10 PM

## 2016-01-12 NOTE — Progress Notes (Signed)
Name: [name] MRN: [id number] DOB: September 23, 1960    ADMISSION DATE:  01/11/2016 CONSULTATION DATE:  01/11/2016  REFERRING MD :  Gilford Raid / EDP  CHIEF COMPLAINT:  Hypoxia   SIGNIFICANT EVENTS  7/19 - admitted with hypoxia, trach appeared malpositioned > changed in ED with success and improvement in sats.  STUDIES:  Port CXR 7/19: Diffuse nodular opacities unchanged from prior imaging.   HISTORY OF PRESENT ILLNESS:  55 y.o. 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  Had CT guided bx of pulmonary nodules which confirmed metastatic adenocarcinoma.  Has been evaluated by Dr. Marko Plume and deemed to not be candidate for chemo due to her functional status.  Is now s/p tracheostomy with 24/7 vent dependence and 24 hour home assistance.  Presented to [facility] ED 7/19 with dyspnea and persistent vent alarm.  Trach (6 cuffless XLT) was evaluated and found to have kinked and "wrinkled" inner cannula.  Trach and inner cannula was changed (#6 shiley XLT cuffed) with immediate improvement in ease of bagging and ventilation.    Rest of workup essentially negative.  Pt to be admitted by [facility] to SDU for overnight observation.  Can likely d/c in AM.  We will obtain tracheal aspirate for completeness.  Extensive discussion between pt and [name] regarding her history, circumstances and poor prognosis. We also discussed patient's prior wishes under circumstances such as this. She has decided and agreed that she would not want to undergo resuscitation if arrest were to occur or to be put through heroic measures, but to otherwise continue with current medical support / therapies.  DNR status established.  SUBJECTIVE: No events overnight.  VITAL SIGNS: Temp:  [98.1 F (36.7 C)-99.8 F (37.7 C)] 99.8 F (37.7 C) (07/20 1114) Pulse Rate:  [71-100] 92 (07/20 1300) Resp:  [14-33] 19 (07/20 1300) BP: (109-194)/(57-97) 111/63 mmHg (07/20 1300) SpO2:  [94 %-100 %] 95 % (07/20  1339) FiO2 (%):  [40 %-100 %] 40 % (07/20 1339)  PHYSICAL EXAMINATION: General: Chronically ill appearing, resting in bed, in NAD. Neuro: A&O x 3, non-focal.  HEENT: [place]/AT. PERRL, sclerae anicteric. Trach in site, mild blood tinged secretions around trach. Cardiovascular: RRR, no M/R/G.  Lungs: Respirations even and unlabored.  Distant BS bilaterally. Abdomen: Morbidly obese, BS x 4, soft, NT/ND.  Musculoskeletal: No gross deformities, chronic edema to BLE's. Skin: Intact, warm, no rashes.  Recent Labs Lab 01/07/16 1046 01/11/16 1350 01/12/16 0518  NA 136 136 137  K 4.5 4.3 4.4  CL 102 102 102  CO2 27 29 30   BUN 9 8 7   CREATININE 0.73 0.77 0.65  GLUCOSE 167* 320* 165*   Recent Labs Lab 01/07/16 1046 01/11/16 1350 01/12/16 0518  HGB 10.8* 12.1 10.5*  HCT 34.9* 40.2 35.4*  WBC 6.8 10.2 8.6  PLT 162 227 166   Dg Chest Port 1 View  01/11/2016  CLINICAL DATA:  Short of breath.  Asthma.  CHF and pneumonia. EXAM: PORTABLE CHEST 1 VIEW COMPARISON:  01/07/2016.  CT of 11/29/2015. FINDINGS: Mildly degraded exam due to AP portable technique and patient body habitus. Patient rotated to the left. Tracheostomy is appropriately positioned. Cardiomegaly accentuated by AP portable technique. Limited evaluation for pleural fluid. No pneumothorax. Relatively diffuse pulmonary metastasis again identified. Lung bases not well evaluated. IMPRESSION: Diffuse pulmonary metastatic disease. Otherwise, limited exam. Cannot exclude pleural fluid and/or bibasilar airspace disease. Electronically Signed   By: Abigail Miyamoto [name]   On: 01/11/2016 14:14  I reviewed CXR myself, trach in good position.  ASSESSMENT / PLAN:  Chronic vent dependent respiratory failure. Hypoxia -- r/t malpositioned trach. Resolved with trach change.  Tracheitis v VAP. 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 chronic intermittent hemoptysis. OSA. Plan: SDU vent bed - no alteration of settings at this point Vent  support  Sputum culture  Hold off on abx for now  Trend fever curve  DNR  Transition to [facility] when bed is available per family's request. Continue home BD's   Discussed with PCCM-NP, PCCM will sign off, please call back if needed.  Rush Farmer, [name] [facility] Pulmonary/Critical Care Medicine. Pager: [phone]. After hours pager: [phone].

## 2016-01-12 NOTE — Progress Notes (Signed)
   01/12/16 1600  Clinical Encounter Type  Visited With Patient  Visit Type Follow-up  Referral From Chaplain  Consult/Referral To Chaplain  Spiritual Encounters  Spiritual Needs Emotional  Stress Factors  Patient Stress Factors Loss of control  CHP followed up with patient offered presence and emotional support. Roe Coombs 01/12/2016

## 2016-01-12 NOTE — Progress Notes (Signed)
Pt skin condition poor and extensive:  Pt 430 lb with 2 panus. (1) top (2) bottom. Fingers bandaged d/t infection causing red and yellow oozing with some fingers scabbed. Some toes appear infected as well. Foams placed on ankles to protect from pressure ulcers b/c pillows refused. Scabbed abrasion on L lower leg. Weeping RLE and LLE. MSAD between labia and butt cheeks. Sacral foam placed to prevent pressure ulcer. MSAD also underneath breasts, between folds under panus 1 and panus 2, in groin. Ulcer-like puncture site R mid under panus 2. Abrasion L mid under panus 1.

## 2016-01-12 NOTE — Progress Notes (Signed)
   01/12/16 1000  Clinical Encounter Type  Visited With Patient and family together  Visit Type Initial  Referral From Nurse  Consult/Referral To Chaplain  Spiritual Encounters  Spiritual Needs Prayer;Emotional;Grief support  Stress Factors  Patient Stress Factors Major life changes  Family Stress Factors Loss  CHP responded to EOL [place] request.  Provided prayer, presence, and emotional support.  CHP will follow up as needed. Roe Coombs 01/12/2016

## 2016-01-12 NOTE — Progress Notes (Addendum)
Daily Progress Note   Patient Name: [name]       Date: 01/12/2016 DOB: August 07, 1960  Age: 55 y.o. MRN#: [id number] Attending Physician: Allie Bossier, MD Primary Care Physician: [name], MD Admit Date: 01/11/2016  Reason for Consultation/Follow-up: Establishing goals of care  Subjective: I spoke with [name] at bedside this morning.  She does well writing on the grease board.     I asked her about [name] once again.  Explaining again that it is end of life.  We discussed her prognosis.  Worsening lung mets, no good oncology options.  She would continue to have high anxiety and repeat trips to the ER with complications related to SOB.  She feels [employer] is the right thing to do now.   She is not tearful about this decision this morning.  She mentions that she needs to get some paper work done at [employer].  She asks me to call [name] and her attorney and make a plan for the 3 of them to meet at [facility] to complete her paper work.  She asks for speech therapy for a valve so that she can speak.  Length of Stay: 1  Current Medications: Scheduled Meds:  . bumetanide  2 mg Oral Daily  . busPIRone  7.5 mg Oral BID  . colchicine  0.6 mg Oral Daily  . DULoxetine  30 mg Oral BID  . febuxostat  80 mg Oral QHS  . fentaNYL (SUBLIMAZE) injection  12.5 mcg Intravenous Once  . fesoterodine  4 mg Oral Daily  . gabapentin  300 mg Oral BID  . guaiFENesin  600 mg Oral BID  . insulin aspart  0-20 Units Subcutaneous TID WC  . insulin regular human CONCENTRATED  60 Units Subcutaneous TID WC  . losartan  100 mg Oral Daily  . meclizine  12.5 mg Oral QHS  . metolazone  5 mg Oral Once per day on Mon Wed Fri  . nebivolol  10 mg Oral Daily  . pantoprazole  80 mg Oral  Daily  . potassium chloride  20 mEq Oral BID AC & HS  . primidone  50 mg Oral BID  . warfarin  5 mg Oral ONCE-1800  . Warfarin - Pharmacist Dosing Inpatient   Does not apply [phone]  Continuous Infusions:    PRN Meds: acetaminophen, albuterol, bisacodyl, budesonide, cholestyramine, diphenoxylate-atropine, fentaNYL (SUBLIMAZE) injection, HYDROcodone-acetaminophen, ipratropium, LORazepam, ondansetron (ZOFRAN) IV, phenazopyridine, polyethylene glycol, promethazine, traZODone  Physical Exam    Morbidly obese female who wakes easily from sleep, no distress CV rrr Resp on vent.  No increased work of breathing Abdomen obese, soft, nt Extremities 2+ edema in each Skin multiple wounds in various stages of healing on fingers, legs, vaginal area.       Vital Signs: BP 92/55 mmHg  Pulse 83  Temp(Src) 99.8 F (37.7 C) (Oral)  Resp 20  Ht 5\' 5"  (1.651 m)  SpO2 96% SpO2: SpO2: 96 % O2 Device: O2 Device: Ventilator O2 Flow Rate:    Intake/output summary:  Intake/Output Summary (Last 24 hours) at 01/12/16 1452 Last data filed at 01/12/16 1300  Gross per 24 hour  Intake   4870 ml  Output      0 ml  Net   4870 ml   LBM:   Baseline Weight:   Most recent weight:         Palliative Assessment/Data:      Patient Active Problem List   Diagnosis Date Noted  . Acute respiratory failure ([place]) 01/11/2016  . Malignant neoplasm metastatic to lung ([place])   . Goals of care, counseling/discussion   . Palliative care encounter   . Nausea   . Dyspnea   . Anxiety   . Declining performance status 01/06/2016  . Obesity hypoventilation syndrome ([place]) 01/06/2016  . Hx MRSA infection 01/06/2016  . Ventilator dependence ([place]) 01/06/2016  . Morbid obesity with BMI of 70 and over, adult ([place]) 01/06/2016  . Chronic anticoagulation 01/06/2016  . Diabetes mellitus type 2, insulin dependent ([place]) 01/06/2016  . Endometrial cancer, FIGO stage IVB ([place]) 01/06/2016  . Metastatic adenocarcinoma to lung  ([place]) 01/06/2016  . Hyperglycemia 08/15/2015  . DKA (diabetic ketoacidoses) ([place]) 08/15/2015  . Osteomyelitis, hand ([place]) 07/13/2015  . Dysuria 07/06/2015  . Endometrial cancer ([place]) 02/10/2015  . Post-menopausal bleeding 01/18/2015  . Postmenopausal bleeding 01/17/2015  . Adverse drug effect 08/23/2014  . E-coli UTI   . Fever 07/11/2014  . Sepsis ([place]) 07/11/2014  . Vaginal bleeding 06/11/2014  . Hypokalemia 02/08/2014  . Acute blood loss anemia 02/08/2014  . Open fracture of toe 02/06/2014  . Chronic osteomyelitis ([place]) 12/03/2013  . Chronic diastolic heart failure ([place]) 07/29/2013  . Mixed hyperlipidemia 07/29/2013  . Chronic respiratory failure ([place]) 04/22/2013  . Urgency incontinence 08/08/2012  . Lactic acidosis 08/08/2012  . Leukocytosis 08/08/2012  . Hemoptysis 03/27/2012  . Abscess and cellulitis 01/01/2012  . Diabetes mellitus type 2 in obese ([place]) 01/01/2012  . Obesity (BMI 35.0-39.9 without comorbidity) ([place]) 01/01/2012  . OSA (obstructive sleep apnea) 01/01/2012  . HTN (hypertension), benign 01/01/2012  . GERD (gastroesophageal reflux disease) 01/01/2012  . 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 Current use of long term anticoagulation 01/01/2012  . Hx of Clostridium difficile infection 11/16/2011  . Splenomegaly 06/01/2011  . Thrombocytopenia ([place]) 06/01/2011  . ALLERGIC RHINITIS 10/31/2009  . ASTHMA, MILD 10/31/2009  . DYSPHAGIA 10/22/2008  . DIARRHEA 10/22/2008  . FATTY LIVER DISEASE 03/26/2008  . Morbid obesity ([place]) 08/07/2007  . OBESITY HYPOVENTILATION SYNDROME 08/07/2007    Palliative Care Assessment & Plan   Patient Profile:  55 y.o. female From home with past medical history of OSA with hypoventilation syndrome s/p tracheostomy placement in 2001, VDRF, DM, CAD, and stage 4 endometrial cancer with metastases to the lungs. She was admitted  on 01/11/2016 with acute on chronic hypoxic respiratory failure. Upon exam by pulmonology she was found  to have a "kinked" inner cannula which was quickly remedied by respiratory. She has also had a great deal of mucus suctioned. PCCM recommended that she be admitted by the Hospitalists overnight for observation. Upon further evaluation it was realized that the patients lung metastases have been gradually becoming worse, she is ventilator dependent and she has no further options for chemotherapy or radiation. The patient has been having increasing difficulty with episodes of SOB and severe anxiety. Over the past month she has been completely ventilator dependent. She has been having increasing amounts of bloody discharge from her trach.  For a more detailed history of the patient's endometrial cancer, please see Dr. Mariana Kaufman excellent office note from 01/06/16.   Assessment/Recommendations Patient is comfortable with and seems appropriate for transfer to [facility] for end of life care.   I spent approximately 30 minutes on the phone with her cousin [name] and her attorney speaking on her behalf.  Code Status: DNR  Prognosis:   < 2 weeks if she weans from vent.  4-6 weeks if she does not.  Patient is ventilator dependent she has significant lung mets from stage 4 endometrial cancer and possible PNA  Discharge Planning:  Hospice facility  Care plan was discussed with Erling Conte of [place], Patient, Jeanne Ivan, [place]  Thank you for allowing the Palliative Medicine Team to assist in the care of this patient.   Time In: 8:10 Time Out: 8:45 Total Time 35 min Prolonged Time Billed no      Greater than 50%  of this time was spent counseling and coordinating care related to the above assessment and plan.  Imogene Burn, PA-C Palliative Medicine Pager: [phone]  Please contact Palliative Medicine Team phone at [phone] for questions and concerns.

## 2016-01-12 NOTE — Progress Notes (Addendum)
ANTICOAGULATION CONSULT NOTE - Follow-up Consult  Pharmacy Consult for Warfarin Indication: history of DVT  Patient Measurements: Height: 5\' 5"  (165.1 cm) IBW/kg (Calculated) : 57  Vital Signs: Temp: 99 F (37.2 C) (07/20 0739) Temp Source: Oral (07/20 0739) BP: 119/61 mmHg (07/20 1000) Pulse Rate: 86 (07/20 1000)  Labs:  Recent Labs  01/11/16 1350 01/11/16 2028 01/12/16 0518  HGB 12.1  --  10.5*  HCT 40.2  --  35.4*  PLT 227  --  166  LABPROT  --  20.6* 20.2*  INR  --  1.78* 1.73*  CREATININE 0.77  --  0.65  TROPONINI <0.03  --   --     CrCl cannot be calculated (Unknown ideal weight.).  Assessment: 55yo female with history of DVT on warfarin PTA presents with respiratory distress. Pharmacy is consulted to dose warfarin for history of DVT. Hgb/PLT trended down. Patient seems to have several pressure ulcers due to morbid obesity but obvious signs of bleeding.   Unsure if patient received dose on 7/19: per nurse note: "Due to be given by ED RN at 2130. Not sure if given. Held at this time."  PTA Warfarin Dose: 5mg  Fri/Sun and 2.5mg  AODs with last dose 01/10/16  Goal of Therapy:  INR 2-3 Monitor platelets by anticoagulation protocol: Yes   Plan:  Warfarin 5mg  tonight x1 Daily INR Monitor s/sx of bleeding  Melburn Popper, PharmD Clinical Pharmacy Resident Pager: [phone] 01/12/2016 10:46 AM

## 2016-01-12 NOTE — Progress Notes (Signed)
  [name] is a 55 y.o Morbidly Obese WF PMHx DM 2, Morbid obesity, severe OSA/ chronic trach on vent at night.and endometrial cancer diagnosed July 2016, s/p XRT. In June patient began have blood from trach and was subsequently diagnosed with metastatic disease to lung. She was not a candidate for chemo but tamoxifen / megace.was discussed at [place] visit a few days ago. Patient has been on vent 24/7 since over the last month. Her SOB has gotten progressively worse per home caregiver. She has continued to have bloody drainage from trach . Brought in by [facility] today for dyspnea, Vent alarm was going off, patient was 80% on room air which increased to normal with bagging   Patient was admitted for placement. Spoke with PA Winston, who states patient to have PICC line placed and then will be transported [facility].  Chronic Respiratory failure -On vent management per [facility] M  DM Type 2 uncontrolled with Complications -Lantus 8 units -Resistant SSI  HTN -Cozaar 100 mg daily -Zaroxolyn 5 mg TID -Bystolic 10 mg daily   No charge

## 2016-01-12 NOTE — Progress Notes (Signed)
Pt was at home alone. Has private duty care w intellichoice. intellichoce came by and will follow. Pt for prob dc to hospice medical facility. sw involved. Will cont to follow.

## 2016-01-12 NOTE — Progress Notes (Signed)
Inpatient Diabetes Program Recommendations  AACE/ADA: New Consensus Statement on Inpatient Glycemic Control (2015)  Target Ranges:  Prepandial:   less than 140 mg/dL      Peak postprandial:   less than 180 mg/dL (1-2 hours)      Critically ill patients:  140 - 180 mg/dL   Results for DEHLIA, FORDICE (MRN [id number]) as of 01/12/2016 12:50  Ref. Range 01/11/2016 19:40 01/11/2016 22:52 01/12/2016 00:33 01/12/2016 07:45 01/12/2016 11:10  Glucose-Capillary Latest Ref Range: 65-99 mg/dL 283 (H) 223 (H) 196 (H) 169 (H) 186 (H)   Review of Glycemic Control  Diabetes history: DM2 Outpatient Diabetes medications: Humulin R U500 (concentrated insulin) 120 units with breakfast, 80 units with lunch, 80 units with supper Current orders for Inpatient glycemic control: Humulin R U500 60 units TID with meals, Novolog 0-20 units TID with meals  Inpatient Diabetes Program Recommendations: Diet: Ordered Regular diet. Please discontinue regular diet and order Carb Modified diet.  Thanks, Barnie Alderman, RN, MSN, CDE Diabetes Coordinator Inpatient Diabetes Program [phone] (Team Pager from [place] to [place]) [phone] ([place] office) [phone] [facility] office) [phone] [facility] office)

## 2016-01-12 NOTE — Clinical Social Work Note (Signed)
Clinical Social Work Assessment  Patient Details  Name: [name] MRN: [id number] Date of Birth: 12/21/1960  Date of referral:  01/12/16               Reason for consult:  End of Life/Hospice                Permission sought to share information with:  Family Supports, Customer service manager Permission granted to share information::  Yes, Verbal Permission Granted  Name::     Hillary Bow Relative [phone]  Agency::  [facility]  Relationship::     Contact Information:     Housing/Transportation Living arrangements for the past 2 months:  [place] of Information:  Patient, Medical Team Patient Interpreter Needed:  None Criminal Activity/Legal Involvement Pertinent to Current Situation/Hospitalization:  No - Comment as needed Significant Relationships:  Other Family Members (Cousin) Lives with:  Self Do you feel safe going back to the place where you live?  No Need for family participation in patient care:  Yes (Comment) (Patient requests that her cousin be involved with decision making.)  Care giving concerns:  Patient is nearing end of life and would like to go to [facility] per palliative recommendation and patient's request.  Social Worker assessment / plan:  Patient is a pleasant 55 year old who is vent and trach dependent.  Patient has been on a vent at home and is not able to talk very well but is alert and oriented x4 and able to communicate via white board.  Patient expresses that she has had a long struggle with her health and she is sad but okay with her decision to go to Hospice.  Patient has an in home caregiver who helps her at home, and a cousin who is quite supportive of patient.  CSW spoke with patient and offered a chaplain who has been in to see her.  Patient completed her own paperwork with [facility] representative.  Patient states she does not have any questions.  Employment status:  Disabled  (Comment on whether or not currently receiving Disability) Insurance information:  Managed Medicare PT Recommendations:  Not assessed at this time Information / Referral to community resources:     Patient/Family's Response to care:  Patient in agreement to going to [facility].  Patient/Family's Understanding of and Emotional Response to Diagnosis, Current Treatment, and Prognosis:  Patient is saddened by the deterioration of her health but she is accepting of it.  Patient was teary eyed when CSW was talking to her about Hospice, but feels it is the most appropriate place for her.  Emotional Assessment Appearance:  Appears stated age Attitude/Demeanor/Rapport:    Affect (typically observed):  Appropriate, Calm Orientation:  Oriented to Self, Oriented to Place, Oriented to  Time, Oriented to Situation Alcohol / Substance use:  Not Applicable Psych involvement (Current and /or in the community):  No (Comment)  Discharge Needs  Concerns to be addressed:  No discharge needs identified Readmission within the last 30 days:  No Current discharge risk:  Lack of support system, Lives alone Barriers to Discharge:  No Barriers Identified   Anell Barr 01/12/2016, 5:34 PM

## 2016-01-12 NOTE — Consult Note (Signed)
[facility] [employer]  Received request from [place] for family interest in [facility]. Clinical information under review. Will update CSW and follow up with patient/family.   Thank you,  Erling Conte, LCSW [phone]

## 2016-01-12 NOTE — Clinical Social Work Note (Signed)
CSW received referral that patient is interested in [facility].  CSW made referral to [facility], who will review and evaluate patient and contact CSW if she is eligible.  Jones Broom. [place], MSW, [place] 01/12/2016 8:50 AM

## 2016-01-12 NOTE — Care Management Important Message (Signed)
Important Message  Patient Details  Name: [name] MRN: [id number] Date of Birth: 09-01-1960   Medicare Important Message Given:  Yes    Loann Quill 01/12/2016, 12:09 PM

## 2016-01-12 NOTE — Progress Notes (Signed)
Nutrition Brief Note  Pt identified on the </= 12 braden score report. Pt is vent dependent with lung metastasis from Stage 4 endometrial cancer. Palliative Care Team following >> [facility] evaluation pending. No nutrition interventions warranted at this time.  Please consult as needed.   Arthur Holms, RD, LDN Pager #: [phone] After-Hours Pager #: [phone]

## 2016-01-12 NOTE — Clinical Social Work Note (Addendum)
[name] has accepted patient and patient will be able to go to [name] facility tomorrow, DNR on patient's chart awaiting signature by physician.  Jones Broom. [place], MSW, [place] 01/12/2016 5:43 PM

## 2016-01-13 ENCOUNTER — Telehealth: Payer: Self-pay | Admitting: Pulmonary Disease

## 2016-01-13 DIAGNOSIS — J9601 Acute respiratory failure with hypoxia: Secondary | ICD-10-CM

## 2016-01-13 LAB — CBC
HCT: 32.5 % — ABNORMAL LOW (ref 36.0–46.0)
Hemoglobin: 9.5 g/dL — ABNORMAL LOW (ref 12.0–15.0)
MCH: 27.5 pg (ref 26.0–34.0)
MCHC: 29.2 g/dL — AB (ref 30.0–36.0)
MCV: 94.2 fL (ref 78.0–100.0)
PLATELETS: 147 10*3/uL — AB (ref 150–400)
RBC: 3.45 MIL/uL — ABNORMAL LOW (ref 3.87–5.11)
RDW: 16.4 % — AB (ref 11.5–15.5)
WBC: 7.4 10*3/uL (ref 4.0–10.5)

## 2016-01-13 LAB — GLUCOSE, CAPILLARY
GLUCOSE-CAPILLARY: 349 mg/dL — AB (ref 65–99)
Glucose-Capillary: 317 mg/dL — ABNORMAL HIGH (ref 65–99)
Glucose-Capillary: 272 mg/dL — ABNORMAL HIGH (ref 65–99)

## 2016-01-13 LAB — PROTIME-INR
INR: 1.66 — AB (ref 0.00–1.49)
Prothrombin Time: 19.6 seconds — ABNORMAL HIGH (ref 11.6–15.2)

## 2016-01-13 MED ORDER — PRIMIDONE 50 MG PO TABS: 50.0000 mg | ORAL_TABLET | Freq: Two times a day (BID) | ORAL | Status: AC

## 2016-01-13 MED ORDER — GUAIFENESIN ER 600 MG PO TB12: 600.0000 mg | ORAL_TABLET | Freq: Two times a day (BID) | ORAL | Status: AC

## 2016-01-13 MED ORDER — CHOLESTYRAMINE 4 G PO PACK: 4.0000 g | PACK | Freq: Every day | ORAL | Status: AC | PRN

## 2016-01-13 MED ORDER — PANTOPRAZOLE SODIUM 40 MG PO TBEC: 80.0000 mg | DELAYED_RELEASE_TABLET | Freq: Every day | ORAL | Status: AC

## 2016-01-13 MED ORDER — CHLORHEXIDINE GLUCONATE CLOTH 2 % EX PADS
6.0000 | MEDICATED_PAD | Freq: Every day | CUTANEOUS | Status: DC
  Administered 2016-01-13: 6 via TOPICAL

## 2016-01-13 MED ORDER — LORAZEPAM 2 MG/ML IJ SOLN: 1.0000 mg | Freq: Four times a day (QID) | INTRAMUSCULAR | Status: AC | PRN

## 2016-01-13 MED ORDER — HYDROCODONE-ACETAMINOPHEN 5-325 MG PO TABS: 1.0000 | ORAL_TABLET | Freq: Four times a day (QID) | ORAL | Status: AC | PRN

## 2016-01-13 MED ORDER — FESOTERODINE FUMARATE ER 4 MG PO TB24: 4.0000 mg | ORAL_TABLET | Freq: Every day | ORAL | Status: AC

## 2016-01-13 MED ORDER — MUPIROCIN 2 % EX OINT
1.0000 "application " | TOPICAL_OINTMENT | Freq: Two times a day (BID) | CUTANEOUS | Status: DC
  Administered 2016-01-13: 1 via NASAL
  Filled 2016-01-13: qty 22

## 2016-01-13 MED ORDER — BUMETANIDE 2 MG PO TABS: 2.0000 mg | ORAL_TABLET | Freq: Every day | ORAL | Status: AC

## 2016-01-13 MED ORDER — FEBUXOSTAT 40 MG PO TABS: 80.0000 mg | ORAL_TABLET | Freq: Every day | ORAL | Status: AC

## 2016-01-13 MED ORDER — DULOXETINE HCL 30 MG PO CPEP: 30.0000 mg | ORAL_CAPSULE | Freq: Two times a day (BID) | ORAL | Status: AC

## 2016-01-13 MED ORDER — INSULIN REGULAR HUMAN (CONC) 500 UNIT/ML ~~LOC~~ SOPN: 60.0000 [IU] | PEN_INJECTOR | Freq: Three times a day (TID) | SUBCUTANEOUS | Status: AC

## 2016-01-13 MED ORDER — INSULIN ASPART 100 UNIT/ML ~~LOC~~ SOLN: 0.0000 [IU] | Freq: Three times a day (TID) | SUBCUTANEOUS | Status: AC

## 2016-01-13 MED ORDER — BISACODYL 5 MG PO TBEC: 5.0000 mg | DELAYED_RELEASE_TABLET | Freq: Every day | ORAL | Status: AC | PRN

## 2016-01-13 MED ORDER — POLYETHYLENE GLYCOL 3350 17 G PO PACK: 17.0000 g | PACK | Freq: Every day | ORAL | Status: AC | PRN

## 2016-01-13 MED ORDER — DIPHENOXYLATE-ATROPINE 2.5-0.025 MG PO TABS: 1.0000 | ORAL_TABLET | Freq: Four times a day (QID) | ORAL | Status: AC | PRN

## 2016-01-13 MED ORDER — METOLAZONE 5 MG PO TABS: 5.0000 mg | ORAL_TABLET | ORAL | Status: AC

## 2016-01-13 MED ORDER — ACETAMINOPHEN 500 MG PO TABS: 1000.0000 mg | ORAL_TABLET | Freq: Four times a day (QID) | ORAL | Status: AC | PRN

## 2016-01-13 MED ORDER — FENTANYL CITRATE (PF) 100 MCG/2ML IJ SOLN: 12.5000 ug | INTRAMUSCULAR | Status: AC | PRN

## 2016-01-13 MED ORDER — PHENAZOPYRIDINE HCL 100 MG PO TABS: 100.0000 mg | ORAL_TABLET | Freq: Three times a day (TID) | ORAL | Status: AC | PRN

## 2016-01-13 MED ORDER — TRAZODONE HCL 50 MG PO TABS: 25.0000 mg | ORAL_TABLET | Freq: Every evening | ORAL | Status: AC | PRN

## 2016-01-13 MED ORDER — ALBUTEROL SULFATE (2.5 MG/3ML) 0.083% IN NEBU: 2.5000 mg | INHALATION_SOLUTION | RESPIRATORY_TRACT | Status: AC | PRN

## 2016-01-13 MED ORDER — GABAPENTIN 300 MG PO CAPS: 300.0000 mg | ORAL_CAPSULE | Freq: Two times a day (BID) | ORAL | Status: AC

## 2016-01-13 MED ORDER — BUSPIRONE HCL 7.5 MG PO TABS: 7.5000 mg | ORAL_TABLET | Freq: Two times a day (BID) | ORAL | Status: AC

## 2016-01-13 MED ORDER — PROMETHAZINE HCL 25 MG PO TABS: 25.0000 mg | ORAL_TABLET | ORAL | Status: AC | PRN

## 2016-01-13 MED ORDER — MECLIZINE HCL 12.5 MG PO TABS: 12.5000 mg | ORAL_TABLET | Freq: Every day | ORAL | Status: AC

## 2016-01-13 MED ORDER — COLCHICINE 0.6 MG PO TABS: 0.6000 mg | ORAL_TABLET | Freq: Every day | ORAL | Status: AC

## 2016-01-13 MED ORDER — SODIUM CHLORIDE 0.9 % IV SOLN: 8.0000 mg | Freq: Four times a day (QID) | INTRAVENOUS | Status: AC | PRN

## 2016-01-13 NOTE — Progress Notes (Signed)
Report called to receiving Amanda Pea at [place] [facility]. [facility] at bedside to transport pt. Patient with no complaints at the current time

## 2016-01-13 NOTE — Discharge Instructions (Signed)
Hospice ° °Hospice is a service that is designed to provide people who are terminally ill and their families with medical, spiritual, and psychological support. Its aim is to improve your quality of life by keeping you as alert and comfortable as possible. Hospice is performed by a team of health care professionals and volunteers who: °· Help keep you comfortable. Hospice can be provided in your home or in a homelike setting. The hospice staff works with your family and friends to help meet your needs. You will enjoy the support of loved ones by receiving much of your basic care from family and friends. °· Provide pain relief and manage your symptoms. The staff supply all necessary medicines and equipment. °· Provide companionship when you are alone. °· Allow you and your family to rest. They may do light housekeeping, prepare meals, and run errands. °· Provide counseling. They will make sure your emotional, spiritual, and social needs and those of your family are being met. °· Provide spiritual care. Spiritual care is individualized to meet your needs and your family's needs. It may involve helping you look at what death means to you, say goodbye, or perform a specific religious ceremony or ritual. °Hospice teams often include: °· A nurse. °· A doctor. °· Social workers. °· Religious leaders (such as a chaplain). °· Trained volunteers. °WHEN SHOULD HOSPICE CARE BEGIN? °Most people who use hospice are believed to have fewer than 6 months to live. Your family and health care providers can help you decide when hospice services should begin. If your condition improves, you may discontinue the program. °WHAT SHOULD I CONSIDER BEFORE SELECTING A PROGRAM? °Most hospice programs are run by nonprofit, independent organizations. Some are affiliated with hospitals, nursing homes, or home health care agencies. Hospice programs can take place in the home or at a hospice center, hospital, or skilled nursing facility. When  choosing a hospice program, ask the following questions: °· What services are available to me? °· What services are offered to my loved ones? °· How involved are my loved ones? °· How involved is my health care provider? °· Who makes up the hospice care team? How are they trained or screened? °· How will my pain and symptoms be managed? °· If my circumstances change, can the services be provided in a different setting, such as my home or in the hospital? °· Is the program reviewed and licensed by the state or certified in some other way? °WHERE CAN I LEARN MORE ABOUT HOSPICE? °You can learn about existing hospice programs in your area from your health care providers. You can also read more about hospice online. The websites of the following organizations contain helpful information: °· The National Hospice and Palliative Care Organization (NHPCO). °· The Hospice Association of America (HAA). °· The Hospice Education Institute. °· The American Cancer Society (ACS). °· Hospice Net. °  °This information is not intended to replace advice given to you by your health care provider. Make sure you discuss any questions you have with your health care provider. °  °Document Released: 09/28/2003 Document Revised: 06/16/2013 Document Reviewed: 04/21/2013 °Elsevier Interactive Patient Education ©2016 Elsevier Inc. ° °

## 2016-01-13 NOTE — Progress Notes (Signed)
Results for HANEEFAH, HOPE (MRN [id number]) as of 01/13/2016 10:23  Ref. Range 01/12/2016 07:45 01/12/2016 11:10 01/12/2016 16:59 01/12/2016 23:33 01/13/2016 07:53  Glucose-Capillary Latest Ref Range: 65-99 mg/dL 169 (H) 186 (H) 284 (H) 349 (H) 317 (H)  Noted that blood sugars are elevated.  Apparently patient did not receive U-500 insulin 60 units last night at suppertime. That seems to be why the fasting blood sugar is elevated. Recommend discontinuing Lantus 8 units daily.  Continue Novolog RESISTANT correction scale as ordered. Harvel Ricks RN BSN CDE

## 2016-01-13 NOTE — Telephone Encounter (Signed)
Noted  

## 2016-01-13 NOTE — Care Management Important Message (Signed)
Important Message  Patient Details  Name: [name] MRN: [id number] Date of Birth: 12-28-1960   Medicare Important Message Given:  Yes    Loann Quill 01/13/2016, 8:18 AM

## 2016-01-13 NOTE — Telephone Encounter (Signed)
PM pt is transferring from ICU to hospice today.  They are wanting to make sure that you will cover  the trach care for the pt.  thanks

## 2016-01-13 NOTE — Discharge Summary (Signed)
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 In June patient began noting blood from her trach and was subsequently diagnosed with metastatic disease to the lung. She was not felt to be a candidate for chemo.   Patient has been on vent 24/7 since June. Despite this her SOB has gotten progressively worse and she has continued to have bloody drainage from her trach . She was brought in by [facility] for evaluation of her dyspnea and hypoxia.  Hospital Course: While in the ED, PCCM evaluated the pt and found that her trach was malpositined.  Upon changing her trach, her hypoxic resp failure resolved.    Following her admission, Palliative Care was consulted.  The decision was made to pursue comfort focused care at [facility]  A PICC line was placed for medication administration, and the pt was transferred to [facility] for comfort care.   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 No futher tx options  available   Chronic intermittent hemoptysis Due to above - likely to recur intermittently - comfort care is goal   DM2 On very large dose of insulin at home - to continue for now to avoid sx of severe hyperglycemia   OSA    Medication List    STOP taking these medications        atorvastatin 10 MG tablet  Commonly known as:  LIPITOR     budesonide 0.25 MG/2ML nebulizer solution  Commonly known as:  PULMICORT     clotrimazole 1 % cream  Commonly known as:  LOTRIMIN     ergocalciferol 50000 units capsule  Commonly known as:  VITAMIN D2     ipratropium 0.02 % nebulizer solution  Commonly known as:  ATROVENT     ketoconazole 2 % cream  Commonly known as:  NIZORAL     LORazepam 1 MG tablet  Commonly known as:  ATIVAN  Replaced by:  LORazepam 2 MG/ML injection     losartan 100 MG tablet  Commonly known as:  COZAAR     multivitamin with minerals Tabs tablet     nebivolol 10 MG tablet  Commonly known as:  BYSTOLIC     Omega-3 Fish Oil 1200 MG Caps     omeprazole 40 MG capsule  Commonly known as:  PRILOSEC  Replaced by:  pantoprazole 40 MG tablet     OXYGEN     potassium chloride SA 20 MEQ tablet  Commonly known as:  K-DUR,KLOR-CON     PROBIOTIC PO     VICTOZA 18 MG/3ML Sopn  Generic drug:  Liraglutide     warfarin 5  MG tablet  Commonly known as:  COUMADIN     zolpidem 5 MG tablet  Commonly known as:  AMBIEN      TAKE these medications        acetaminophen 500 MG tablet  Commonly known as:  TYLENOL  Take 2 tablets (1,000 mg total) by mouth every 6 (six) hours as needed for mild pain.     albuterol (2.5 MG/3ML) 0.083% nebulizer solution  Commonly known as:  PROVENTIL  Take 3 mLs (2.5 mg total) by nebulization every 4 (four) hours as needed for wheezing or shortness of breath.     bisacodyl 5 MG EC tablet  Commonly known as:  DULCOLAX  Take 1 tablet (5 mg total) by mouth daily as needed for moderate constipation.     bumetanide 2 MG tablet    Commonly known as:  BUMEX  Take 1 tablet (2 mg total) by mouth daily.     busPIRone 7.5 MG tablet  Commonly known as:  BUSPAR  Take 1 tablet (7.5 mg total) by mouth 2 (two) times daily.     cholestyramine 4 g packet  Commonly known as:  QUESTRAN  Take 1 packet (4 g total) by mouth daily as needed (loose stools/ IBS).     colchicine 0.6 MG tablet  Take 1 tablet (0.6 mg total) by mouth daily.     diphenoxylate-atropine 2.5-0.025 MG tablet  Commonly known as:  LOMOTIL  Take 1-2 tablets by mouth 4 (four) times daily as needed for diarrhea or loose stools.     DULoxetine 30 MG capsule  Commonly known as:  CYMBALTA  Take 1 capsule (30 mg total) by mouth 2 (two) times daily.     febuxostat 40 MG tablet  Commonly known as:  ULORIC  Take 2 tablets (80 mg total) by mouth at bedtime.     fentaNYL 100 MCG/2ML injection  Commonly known as:  SUBLIMAZE  Inject 0.25 mLs (12.5 mcg total) into the vein every 15 (fifteen) minutes as needed (dyspnea).     fesoterodine 4 MG Tb24 tablet  Commonly known as:  TOVIAZ  Take 1 tablet (4 mg total) by mouth daily.     gabapentin 300 MG capsule  Commonly known as:  NEURONTIN  Take 1 capsule (300 mg total) by mouth 2 (two) times daily.     guaiFENesin 600 MG 12 hr tablet  Commonly known as:  MUCINEX  Take 1 tablet (600 mg total) by mouth 2 (two) times daily.     HYDROcodone-acetaminophen 5-325 MG tablet  Commonly known as:  NORCO  Take 1 tablet by mouth every 6 (six) hours as needed for moderate pain.     insulin aspart 100 UNIT/ML injection  Commonly known as:  novoLOG  Inject 0-20 Units into the skin 3 (three) times daily with meals.     insulin regular human CONCENTRATED 500 UNIT/ML kwikpen  Commonly known as:  HUMULIN R U-500 KWIKPEN  Inject 60 Units into the skin 3 (three) times daily with meals.     LORazepam 2 MG/ML injection  Commonly known as:  ATIVAN  Inject 0.5 mLs (1 mg total) into the vein every 6 (six) hours as needed for  anxiety or sedation.     meclizine 12.5 MG tablet  Commonly known as:  ANTIVERT  Take 1 tablet (12.5 mg total) by mouth at bedtime.     megestrol 40 MG tablet  Commonly known as:  MEGACE  TAKE 2 TABLETS (80 MG TOTAL) BY MOUTH  3 (THREE) TIMES DAILY.     metolazone 5 MG tablet  Commonly known as:  ZAROXOLYN  Take 1 tablet (5 mg total) by mouth 3 (three) times a week.     ondansetron 8 mg in sodium chloride 0.9 % 50 mL  Inject 8 mg into the vein every 6 (six) hours as needed.     pantoprazole 40 MG tablet  Commonly known as:  PROTONIX  Take 2 tablets (80 mg total) by mouth daily.     phenazopyridine 100 MG tablet  Commonly known as:  PYRIDIUM  Take 1 tablet (100 mg total) by mouth 3 (three) times daily as needed (urinary pain).     polyethylene glycol packet  Commonly known as:  MIRALAX / GLYCOLAX  Take 17 g by mouth daily as needed for mild constipation.     primidone 50 MG tablet  Commonly known as:  MYSOLINE  Take 1 tablet (50 mg total) by mouth 2 (two) times daily.     promethazine 25 MG tablet  Commonly known as:  PHENERGAN  Take 1 tablet (25 mg total) by mouth every 4 (four) hours as needed for nausea or vomiting.     traZODone 50 MG tablet  Commonly known as:  DESYREL  Take 0.5 tablets (25 mg total) by mouth at bedtime as needed for sleep.       Day of Discharge BP 116/82 mmHg  Pulse 71  Temp(Src) 98.5 F (36.9 C) (Oral)  Resp 19  Ht 5\' 5"  (1.651 m)  Wt 195.047 kg (430 lb)  BMI 71.56 kg/m2  SpO2 100%  Physical Exam: Pt interviewed but exam not indicated.  She is alert and comfortable w/ no new complaints.  I have discussed my plan to simplify her med regimen and she agrees.    Basic Metabolic Panel:  Recent Labs Lab 01/07/16 1046 01/11/16 1350 01/12/16 0518  NA 136 136 137  K 4.5 4.3 4.4  CL 102 102 102  CO2 27 29 30   GLUCOSE 167* 320* 165*  BUN 9 8 7   CREATININE 0.73 0.77 0.65  CALCIUM 9.0 8.6* 8.8*    Liver Function Tests:  Recent  Labs Lab 01/07/16 1046 01/11/16 1350  AST 19 24  ALT 19 27  ALKPHOS 49 61  BILITOT 0.4 0.3  PROT 7.1 7.8  ALBUMIN 2.7* 3.4*   Coags:  Recent Labs Lab 01/07/16 1046 01/11/16 2028 01/12/16 0518 01/13/16 0256  INR 1.99* 1.78* 1.73* 1.66*   CBC:  Recent Labs Lab 01/07/16 1046 01/11/16 1350 01/12/16 0518 01/13/16 0256  WBC 6.8 10.2 8.6 7.4  NEUTROABS 5.1 6.7  --   --   HGB 10.8* 12.1 10.5* 9.5*  HCT 34.9* 40.2 35.4* 32.5*  MCV 90.9 93.9 93.2 94.2  PLT 162 227 166 147*   Cardiac Enzymes:  Recent Labs Lab 01/11/16 1350  TROPONINI <0.03   BNP (last 3 results)  Recent Labs  08/15/15 1238 12/06/15 1245 01/07/16 1046  BNP 46.8 20.9 17.5   CBG:  Recent Labs Lab 01/12/16 0745 01/12/16 1110 01/12/16 1659 01/12/16 2333 01/13/16 0753  GLUCAP 169* 186* 284* 349* 317*    Recent Results (from the past 240 hour(s))  MRSA PCR Screening     Status: Abnormal   Collection Time: 01/12/16  1:44 AM  Result Value Ref Range Status   MRSA by PCR POSITIVE (A) NEGATIVE Final    Comment:        The GeneXpert MRSA Assay (FDA approved for NASAL specimens only),  is one component of a comprehensive MRSA colonization surveillance program. It is not intended to diagnose MRSA infection nor to guide or monitor treatment for MRSA infections. RESULT CALLED TO, READ BACK BY AND VERIFIED WITH: THOMLINSON,RN @0358  01/12/16 MKELLY      01/13/2016, 11:19 AM   Cherene Altes, MD [facility] Office  [phone] Pager [phone]  On-Call/Text Page:      [email or web address]      password [facility]

## 2016-01-13 NOTE — Clinical Social Work Note (Signed)
Clinical Social Worker facilitated patient discharge including communicating with patient and facility to confirm patient discharge plans.  Clinical information faxed to facility and patient agreeable with plan.  CSW spoke with facility liaison who states that patient respiratory therapist from home will get patient ventilator from home and bring directly to [facility] [place] to notify [facility] once ventilator is in the building so that transport can officially pick up patient.  Patient is aware of the plan and hopeful for discharge around 14:00.  Friends/family also aware and on board with current plan.  Ambulance transport arranged via [facility] to [facility].  RN to call report prior to discharge (number on discharge paperwork).    Clinical Social Worker will sign off for now as social work intervention is no longer needed. Please consult [name] again if new need arises.  Barbette Or, [place]

## 2016-01-13 NOTE — Progress Notes (Signed)
+  MRSA swab. Patient on contact previously; MRSA order set initiated. No family at bedside at this time.

## 2016-01-13 NOTE — Evaluation (Signed)
[facility] Speaking Valve - Evaluation Patient Details  Name: [name] MRN: [id number] Date of Birth: [birth year]  Today's Date: 01/13/2016 Time: 1045-1100 SLP Time Calculation (min) (ACUTE ONLY): 15 min  Past Medical History:  Past Medical History  Diagnosis Date  . Morbid obesity ([address])   . Depressive disorder, not elsewhere classified   . Asthma   . Dyslipidemia   . Clostridium difficile enterocolitis 2012  . Splenomegaly 06/01/2011  . Thrombocytopenia ([place]) 06/01/2011  . Hepatosplenomegaly   . Gout   . GERD (gastroesophageal reflux disease)   . Osteoarthritis   . Polyneuropathy ([place])   . Urinary incontinence   . Amenorrhea   . CHF (congestive heart failure) ([place])   . Hyperlipidemia   . Leg swelling   . Nausea   . Weakness   . Palpitations   . Anginal pain ([place])     no chest pain in years  . Dysrhythmia     heart palpations  . Anxiety   . Pneumonia   . Shortness of breath   . Chronic kidney disease     overactive bladder  . Peripheral neuropathy ([place])   . DVT (deep venous thrombosis) ([place])   . OSA (obstructive sleep apnea)     uses ventilator ( has trach)  . Diabetes mellitus     type 2  . Irritable bowel syndrome   . PONV (postoperative nausea and vomiting)     just nausea  . Endometrial cancer ([place]) dx'd 01/19/15   Past Surgical History:  Past Surgical History  Procedure Laterality Date  . Cholecystectomy    . Tracheostomy  2001  . Endometrial ablation    . Skin graft    . Finger surgery      ring finger on right hand  . Esophageal dilation  2003  . Breast biopsy      needle core, left  . Dilation and curettage of uterus      times 2  . Tracheal dilitation  01/24/2012    Procedure: TRACHEAL DILITATION;  Surgeon: Bianca Montane, MD;  Location: [place];  Service: ENT;  Laterality: N/A;  Trache change with possible dilation, from size 6 to size 7 uncuffed  . Amputation Right 11/19/2013    Procedure: AMPUTATION DIGIT;  Surgeon: Bianca Sickle, MD;   Location: [place];  Service: Orthopedics;  Laterality: Right;  Partial amputation right long finger, Debridement right ring finger   . Amputation Right 06/03/2014    Procedure: REVISION AMPUTATION RIGHT RING FINGER;  Surgeon: Bianca Brod, MD;  Location: [place];  Service: Orthopedics;  Laterality: Right;  . Dilatation & currettage/hysteroscopy with resectocope N/A 01/17/2015    Procedure: DILATATION & CURETTAGE/HYSTEROSCOPY WITH RESECTOCOPE; Pap Smear;  Surgeon: Bianca Dawley, MD;  Location: [facility];  Service: Gynecology;  Laterality: N/A;  . Intrauterine device (iud) insertion N/A 01/17/2015    Procedure: INTRAUTERINE DEVICE (IUD) INSERTION;  Surgeon: Bianca Dawley, MD;  Location: [facility];  Service: Gynecology;  Laterality: N/A;  . Finger surgery Bilateral 06/16/15    I & D of multiple fingers  . Incision and drainage Left 06/16/2015    Procedure: IRRIGATION AND DEBRIDEMENT OF LEFT RING FINGER AND LEFT SMALL FINGER.;  Surgeon: Bianca Brod, MD;  Location: [place];  Service: Orthopedics;  Laterality: Left;  . Dressing change under anesthesia Right 06/16/2015    Procedure: DRESSING CHANGE UNDER ANESTHESIA TO RIGHT THUMB AND RIGHT FIRST FINGER.;  Surgeon: Bianca Brod, MD;  Location: [place];  Service: Orthopedics;  Laterality: Right;   HPI:  55 y.o. female From home with past medical history of OSA with hypoventilation syndrome s/p tracheostomy placement in 2001, VDRF, DM, CAD, and stage 4 endometrial cancer with metastases to the lungs. She was admitted on 01/11/2016 with acute on chronic hypoxic respiratory failure. Upon exam by pulmonology she was found to have a "kinked" inner cannula which was quickly remedied by respiratory. She has also had a great deal of mucus suctioned. PCCM recommended that she be admitted by the Hospitalists overnight for observation. Upon further evaluation it was realized that the patients lung metastases have been gradually becoming  worse, she is ventilator dependent and she has no further options for chemotherapy or radiation. The patient has been having increasing difficulty with episodes of SOB and severe anxiety. Over the past month she has been completely ventilator dependent.  Pt is D/Cing today to [facility], residential hospice facility.  SLP has been asked to evaluation for use of inline speaking valve prior to D/C.    Assessment / Plan / Recommendation Clinical Impression  Pt evaluated for potential use of inline PMV.  Respiratory therapist present to assist with ventilator management.  Cuff was deflated, and upper airway assessed for patency - there were no changes in peak pressures nor decrease in expiratory tidal volume upon cuff deflation, concerning for poor access to upper airway.  Hence, PMV not appropriate for use.  Provided inline PMV/packaging to caregiver so that assessment can be repeated at [facility] when appropriate.     SLP Assessment  Patient needs continued Speech Lanaguage Pathology Services (at [facility])    Follow Up Recommendations       Frequency and Duration         PMSV Trial PMSV was placed for: not placed Able to redirect subglottic air through upper airway: No Able to Attain Phonation: Yes (talking around trach) Voice Quality: Low vocal intensity Able to Expectorate Secretions: No attempts Level of Secretion Expectoration with PMSV: Tracheal Breath Support for Phonation: Inadequate   Tracheostomy Tube  Additional Tracheostomy Tube Assessment Fenestrated: No    Vent Dependency  Vent Dependent: Yes Vent Mode: PRVC Set Rate: 16 bmp PEEP: 5 cmH20 FiO2 (%): 40 % Vt Set: 550 mL    Cuff Deflation Trial  GO   [name] L. [name], [place] CCC/SLP Pager [phone]  Tolerated Cuff Deflation: Yes Length of Time for Cuff Deflation Trial: several attempts Behavior: Alert;Controlled        [name] 01/13/2016, 11:08 AM

## 2016-01-13 NOTE — Telephone Encounter (Signed)
I spoke with hospice today and agreed to be the attending for this. Thanks

## 2016-01-16 ENCOUNTER — Telehealth: Payer: Self-pay | Admitting: Pulmonary Disease

## 2016-01-18 ENCOUNTER — Ambulatory Visit: Payer: Medicare Other | Admitting: Interventional Cardiology

## 2016-01-19 ENCOUNTER — Ambulatory Visit: Payer: Medicare Other | Admitting: Interventional Cardiology

## 2016-01-20 ENCOUNTER — Ambulatory Visit: Payer: Medicare Other | Admitting: Obstetrics and Gynecology

## 2016-01-23 ENCOUNTER — Ambulatory Visit: Payer: Medicare Other | Admitting: Gynecology

## 2016-01-24 NOTE — Telephone Encounter (Signed)
Noted by triage, thank you. Will sign and route to PM as [place]

## 2016-01-24 DEATH — deceased

## 2016-02-15 NOTE — Telephone Encounter (Signed)
error 

## 2016-03-07 ENCOUNTER — Ambulatory Visit: Payer: Medicare Other | Admitting: Radiation Oncology

## 2016-03-09 ENCOUNTER — Ambulatory Visit: Payer: Medicare Other | Admitting: Radiation Oncology

## 2016-09-17 ENCOUNTER — Ambulatory Visit: Payer: Medicare Other | Admitting: Neurology
